# Patient Record
Sex: Male | Born: 1945 | Race: Black or African American | Hispanic: No | Marital: Single | Smoking: Never smoker
Health system: Southern US, Community
[De-identification: ages and names within clinical notes are randomized; demographics above are authoritative.]

## PROBLEM LIST (undated history)

## (undated) DIAGNOSIS — M25552 Pain in left hip: Secondary | ICD-10-CM

## (undated) DIAGNOSIS — C801 Malignant (primary) neoplasm, unspecified: Secondary | ICD-10-CM

## (undated) DIAGNOSIS — R569 Unspecified convulsions: Secondary | ICD-10-CM

## (undated) DIAGNOSIS — F411 Generalized anxiety disorder: Secondary | ICD-10-CM

## (undated) DIAGNOSIS — D62 Acute posthemorrhagic anemia: Secondary | ICD-10-CM

## (undated) DIAGNOSIS — S72142A Displaced intertrochanteric fracture of left femur, initial encounter for closed fracture: Secondary | ICD-10-CM

## (undated) DIAGNOSIS — F419 Anxiety disorder, unspecified: Secondary | ICD-10-CM

## (undated) DIAGNOSIS — E46 Unspecified protein-calorie malnutrition: Secondary | ICD-10-CM

## (undated) DIAGNOSIS — E8809 Other disorders of plasma-protein metabolism, not elsewhere classified: Secondary | ICD-10-CM

## (undated) HISTORY — DX: Other disorders of plasma-protein metabolism, not elsewhere classified: E88.09

## (undated) HISTORY — PX: TONSILECTOMY, ADENOIDECTOMY, BILATERAL MYRINGOTOMY AND TUBES: SHX2538

## (undated) HISTORY — PX: COLON SURGERY: SHX602

## (undated) HISTORY — DX: Acute posthemorrhagic anemia: D62

## (undated) HISTORY — DX: Unspecified protein-calorie malnutrition: E46

## (undated) HISTORY — DX: Generalized anxiety disorder: F41.1

## (undated) HISTORY — PX: HIP SURGERY: SHX245

## (undated) HISTORY — DX: Pain in left hip: M25.552

## (undated) HISTORY — PX: RECTAL SURGERY: SHX760

## (undated) HISTORY — DX: Displaced intertrochanteric fracture of left femur, initial encounter for closed fracture: S72.142A

## (undated) NOTE — *Deleted (*Deleted)
PMR Admission Coordinator Pre-Admission Assessment  Patient: Carlos [name] is an 51 y.o., male MRN: [id number] DOB: Jul 15, 1945 Height: 5\' 7"  (170.2 cm) Weight: 61.7 kg  Insurance Information HMO: ***    PPO: ***     PCP:      IPA:      80/20:      OTHER:  PRIMARY: [employer] NAP      Policy#: [id number]      Subscriber: patient CM Name: ***      Phone#: ***     Fax#: *** Pre-Cert#: ***      Employer: *** Benefits:  Phone #: ***     Name: *** Dolores Hoose. Date: ***     Deduct: ***      Out of Pocket Max: ***      Life Max: *** CIR: ***      SNF: *** Outpatient: ***     Co-Pay: *** Home Health: ***      Co-Pay: *** DME: ***     Co-Pay: *** Providers: in-network SECONDARY:       Policy#:      Phone#:   Financial Counselor:       Phone#:   The Data processing manager" for patients in Inpatient Rehabilitation Facilities with attached "Privacy Act Statement-Health Care Records" was provided and verbally reviewed with: {CHL IP Patient Family WN:[id number]}  Emergency Contact Information Contact Information    Name Relation Home Work Mobile   [place] Sister [phone]  [phone]   Doroteo, Nickolson [phone]  [phone]      Current Medical History  Patient Admitting Diagnosis: closed comminuted intertrochanteric fracture of left femur; s/p IM nailing History of Present Illness: *** Complete NIHSS TOTAL: 2  Patient's medical record from [facility] has been reviewed by the rehabilitation admission coordinator and physician.  Past Medical History  Past Medical History:  Diagnosis Date  . Anxiety   . Benign prostatic hyperplasia 03/30/2013   10/1 IMO update  . Cancer (HCC)   . 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 Seizures (HCC)     Family History   family history includes Brain cancer in his father; Dementia in his mother; Diabetes in his mother; Osteoporosis in his mother.  Prior Rehab/Hospitalizations Has the patient had  prior rehab or hospitalizations prior to admission? Yes  Has the patient had major surgery during 100 days prior to admission? Yes   Current Medications  Current Facility-Administered Medications:  .  Chlorhexidine Gluconate Cloth 2 % PADS 6 each, 6 each, Topical, Daily, Dahal, Binaya, MD .  docusate sodium (COLACE) capsule 100 mg, 100 mg, Oral, BID, Swinteck, Arlys John, MD, 100 mg at 12/12/19 1058 .  enoxaparin (LOVENOX) injection 40 mg, 40 mg, Subcutaneous, Q24H, Swinteck, Arlys John, MD, 40 mg at 12/12/19 1057 .  HYDROcodone-acetaminophen (NORCO/VICODIN) 5-325 MG per tablet 1 tablet, 1 tablet, Oral, Q4H PRN, Samson Frederic, MD, 1 tablet at 12/11/19 2109 .  HYDROmorphone (DILAUDID) injection 0.5 mg, 0.5 mg, Intravenous, Q4H PRN, Swinteck, Arlys John, MD .  levETIRAcetam (KEPPRA) tablet 1,250 mg, 1,250 mg, Oral, BID, Swinteck, Arlys John, MD, 1,250 mg at 12/12/19 1058 .  Lidocaine (Anorectal) SUPP 50 mg, 50 mg, Rectal, Q8H PRN, Swinteck, Arlys John, MD .  menthol-cetylpyridinium (CEPACOL) lozenge 3 mg, 1 lozenge, Oral, PRN **OR** phenol (CHLORASEPTIC) mouth spray 1 spray, 1 spray, Mouth/Throat, PRN, Swinteck, Arlys John, MD .  methocarbamol (ROBAXIN) tablet 500 mg, 500 mg, Oral, Q6H PRN **OR** methocarbamol (ROBAXIN) 500 mg in dextrose 5 % 50 mL IVPB,  500 mg, Intravenous, Q6H PRN, Swinteck, Arlys John, MD .  metoCLOPramide (REGLAN) tablet 5-10 mg, 5-10 mg, Oral, Q8H PRN **OR** metoCLOPramide (REGLAN) injection 5-10 mg, 5-10 mg, Intravenous, Q8H PRN, Swinteck, Arlys John, MD .  ondansetron (ZOFRAN) tablet 4 mg, 4 mg, Oral, Q6H PRN **OR** ondansetron (ZOFRAN) injection 4 mg, 4 mg, Intravenous, Q6H PRN, Swinteck, Arlys John, MD .  zolpidem (AMBIEN) tablet 5 mg, 5 mg, Oral, QHS PRN, Samson Frederic, MD, 5 mg at 12/10/19 2054  Patients Current Diet:  Diet Order            Diet regular Room service appropriate? Yes; Fluid consistency: Thin  Diet effective now                 Precautions / Restrictions Precautions Precautions: Fall  Restrictions Weight Bearing Restrictions: Yes LUE Weight Bearing: Weight bearing as tolerated   Has the patient had 2 or more falls or a fall with injury in the past year? pt fell ~2 times resulting in injuries but daughter not sure if falls occured in 2019 or 2020  Prior Activity Level Community (5-7x/wk): 5x/week for medical tx  Prior Functional Level Self Care: Did the patient need help bathing, dressing, using the toilet or eating? Independent  Indoor Mobility: Did the patient need assistance with walking from room to room (with or without device)? Independent  Stairs: Did the patient need assistance with internal or external stairs (with or without device)? Independent  Functional Cognition: Did the patient need help planning regular tasks such as shopping or remembering to take medications? Needed some help  Home Assistive Devices / Equipment Home Assistive Devices/Equipment: Eyeglasses Home Equipment: Gilmer Mor - single point (pt's mother utilizes all other DME owned)  Prior Device Use: Indicate devices/aids used by the patient prior to current illness, exacerbation or injury? None of the above  Current Functional Level Cognition  Overall Cognitive Status: Impaired/Different from baseline Current Attention Level: Sustained Orientation Level: Oriented to person, Disoriented to place, Disoriented to time, Disoriented to situation Following Commands: Follows one step commands consistently Safety/Judgement: Decreased awareness of safety, Decreased awareness of deficits    Extremity Assessment (includes Sensation/Coordination)  Upper Extremity Assessment: Overall WFL for tasks assessed  Lower Extremity Assessment: LLE deficits/detail LLE Deficits / Details: L hip flexion 3-/5, knee flexion/extension 3-/5, ankle PF/DF 4/5, pain limiting    ADLs       Mobility  Overal bed mobility: Needs Assistance Bed Mobility: Supine to Sit Supine to sit: Mod assist, HOB elevated General  bed mobility comments: pt requires assistance to mobilize LLE and to pull trunk into sitting    Transfers  Overall transfer level: Needs assistance Equipment used: Rolling walker (2 wheeled) Transfers: Sit to/from Stand Sit to Stand: Mod assist General transfer comment: increased time, PT cueing for hand placement and technique. Pt requires PT physical assistance to power up into standing    Ambulation / Gait / Stairs / Wheelchair Mobility  Ambulation/Gait Ambulation/Gait assistance: Mod assist Gait Distance (Feet): 2 Feet Assistive device: Rolling walker (2 wheeled) Gait Pattern/deviations: Step-to pattern General Gait Details: short step to gait with reduced LLE foot clearance, increased trunk flexion, 2 losses of balance when stepping backward to turn to chair Gait velocity: reduced Gait velocity interpretation: <1.31 ft/sec, indicative of household ambulator    Posture / Balance Dynamic Sitting Balance Sitting balance - Comments: reliant on UE support of bed Balance Overall balance assessment: Needs assistance Sitting-balance support: Single extremity supported, Bilateral upper extremity supported, Feet supported Sitting balance-Leahy Scale:  Poor Sitting balance - Comments: reliant on UE support of bed Standing balance support: Bilateral upper extremity supported Standing balance-Leahy Scale: Poor Standing balance comment: min-modA with BUE support of RW    Special needs/care consideration Skin Avulsion: buttocks; Blister: buttocks; Pressure injury: R buttocks stage 2; Surgical incision: Left leg and Designated visitor Abrar Bilton, sister, Kristine Garbe   Previous Home Environment (from acute therapy documentation) Living Arrangements: Spouse/significant other  Lives With: Spouse (caregivers/family present for 24 hour care) Available Help at Discharge: Family, Personal care attendant, Available 24 hours/day Type of Home: House Home Layout: Two level Alternate Level  Stairs-Rails: Right Alternate Level Stairs-Number of Steps: 13-16 Home Access: Stairs to enter, Ramped entrance Entrance Stairs-Rails: None Entrance Stairs-Number of Steps: 2-3 Bathroom Shower/Tub: Engineer, manufacturing systems: Standard Bathroom Accessibility: Yes How Accessible: Accessible via walker Home Care Services: Other (Comment) (caregivers make sure he gets food and medicines.)  Discharge Living Setting Plans for Discharge Living Setting: Patient's home Type of Home at Discharge: House Discharge Home Layout: Two level Alternate Level Stairs-Rails: Right Alternate Level Stairs-Number of Steps: 13-16 Discharge Home Access: Stairs to enter, Ramped entrance Entrance Stairs-Rails: None Entrance Stairs-Number of Steps: 2-3 Discharge Bathroom Shower/Tub: Tub/shower unit Discharge Bathroom Toilet: Standard Discharge Bathroom Accessibility: Yes How Accessible: Accessible via walker Does the patient have any problems obtaining your medications?: No  Social/Family/Support Systems Patient Roles: Spouse Anticipated Caregiver: Sherryl Barters, daughter; Flora Lipps, daughter Anticipated Caregiver's Contact Information: Windell Moulding: [phone] Cloud: [phone] Caregiver Availability: Other (Comment) (caregivers 4 days/week; family on weekends) Discharge Plan Discussed with Primary Caregiver: Yes Is Caregiver In Agreement with Plan?: Yes Does Caregiver/Family have Issues with Lodging/Transportation while Pt is in Rehab?: No  Goals Pt/Family Agrees to Admission and willing to participate: Yes Program Orientation Provided & Reviewed with Pt/Caregiver Including Roles  & Responsibilities: Yes  Decrease burden of Care through IP rehab admission: NA  Possible need for SNF placement upon discharge: NA  Patient Condition: {PATIENT'S CONDITION:22832}  Preadmission Screen Completed By:  Domingo Pulse, 12/12/2019 2:52 PM  ______________________________________________________________________   Discussed status with Dr. Marland Kitchen on *** at *** and received approval for admission today.  Admission Coordinator:  Domingo Pulse, CCC-SLP, time ***Dorna Bloom ***   Assessment/Plan: Diagnosis: 1. Does the need for close, 24 hr/day Medical supervision in concert with the patient's rehab needs make it unreasonable for this patient to be served in a less intensive setting? {yes_no_potentially:3041433} 2. Co-Morbidities requiring supervision/potential complications: *** 3. Due to {due GN:5621308}, does the patient require 24 hr/day rehab nursing? {yes_no_potentially:3041433} 4. Does the patient require coordinated care of a physician, rehab nurse, PT, OT, and SLP to address physical and functional deficits in the context of the above medical diagnosis(es)? {yes_no_potentially:3041433} Addressing deficits in the following areas: {deficits:3041436} 5. Can the patient actively participate in an intensive therapy program of at least 3 hrs of therapy 5 days a week? {yes_no_potentially:3041433} 6. The potential for patient to make measurable gains while on inpatient rehab is {potential:3041437} 7. Anticipated functional outcomes upon discharge from inpatient rehab: {functional outcomes:304600100} PT, {functional outcomes:304600100} OT, {functional outcomes:304600100} SLP 8. Estimated rehab length of stay to reach the above functional goals is: *** 9. Anticipated discharge destination: {anticipated dc setting:21604} 10. Overall Rehab/Functional Prognosis: {potential:3041437}   MD Signature: ***

## (undated) NOTE — *Deleted (*Deleted)
Physical Medicine and Rehabilitation Admission H&P    Chief Complaint  Patient presents with  . Seizures  : HPI: Carlos [name] is a 55 year old right-handed male with history of epilepsy maintained on Keppra, rectal adenocarcinoma maintained on Xeloda receiving radiation therapy followed by oncology services Dr.Feng as well as radiation oncology Dr. Mitzi Hansen, BPH, anxiety as well as history of mild cognitive disorder. Per chart review patient lives with his 56 year old mother. Independent prior to admission working at the [employer]. His mother has 24-hour personal care attendants. Patient reportedly has good support of local family.  Presented 12/08/2019 to [facility] hospital with reported breakthrough seizure after radiation treatment.  He was treated with Ativan IM and loaded with Keppra.  Cranial CT scan negative.  Admission chemistries potassium 3.0 glucose 136, hemoglobin 12.2 WBC 3.7, ammonia level 182 question false positive and repeated 24, lactic acid 1.9.  EEG with no seizure.  While patient was at [facility] long ED he did sustain another seizure and fell out of the bed without loss of consciousness landing on his left hip.  X-rays and imaging revealed left intertrochanteric femur fracture.  Patient underwent intramedullary fixation 12/11/2019 per Dr. Linna Caprice.  Weightbearing as tolerated.  Maintained on aspirin 325 mg daily for DVT prophylaxis.  Acute blood loss anemia 8.4 and monitored.  Therapy evaluations completed and patient was admitted for a comprehensive rehab program.  Review of Systems  Constitutional: Negative for chills and fever.  HENT: Negative for hearing loss.   Eyes: Negative for blurred vision and double vision.  Respiratory: Negative for cough and shortness of breath.   Cardiovascular: Negative for chest pain and palpitations.  Gastrointestinal: Positive for constipation. Negative for heartburn, nausea and vomiting.  Genitourinary: Negative for dysuria, flank  pain and hematuria.  Musculoskeletal: Positive for joint pain and myalgias.  Skin: Negative for rash.  Neurological: Positive for seizures.  Psychiatric/Behavioral:       Anxiety  All other systems reviewed and are negative.  Past Medical History:  Diagnosis Date  . Anxiety   . Benign prostatic hyperplasia 03/30/2013   10/1 IMO update  . Cancer (HCC)   . 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 Seizures (HCC)    Past Surgical History:  Procedure Laterality Date  . INTRAMEDULLARY (IM) NAIL INTERTROCHANTERIC Left 12/11/2019   Procedure: INTRAMEDULLARY (IM) NAIL INTERTROCHANTRIC;  Surgeon: Samson Frederic, MD;  Location: [facility] OR;  Service: Orthopedics;  Laterality: Left;  . PORTACATH PLACEMENT N/A 07/01/2019   Procedure: PORT ULTRASOUND GUIDED PLACEMENT;  Surgeon: Ovidio Kin, MD;  Location: [facility] ORS;  Service: General;  Laterality: N/A;  . PROSTATE SURGERY  2004  . TONSILECTOMY, ADENOIDECTOMY, BILATERAL MYRINGOTOMY AND TUBES     Family History  Problem Relation Age of Onset  . Osteoporosis Mother   . Dementia Mother   . Diabetes Mother   . Brain cancer Father    Social History:  reports that he has never smoked. He has never used smokeless tobacco. He reports that he does not drink alcohol and does not use drugs. Allergies: No Known Allergies Medications Prior to Admission  Medication Sig Dispense Refill  . bismuth subsalicylate (PEPTO BISMOL) 262 MG/15ML suspension Take 30 mLs by mouth every 6 (six) hours as needed for indigestion.    . capecitabine (XELODA) 500 MG tablet Take 3 tablets (1,500 mg total) by mouth 2 (two) times daily after a meal. Take on days of radiation, Monday through Friday (Patient taking differently: Take 500 mg by  mouth See admin instructions. Takes 3 tablets in the morning and 3 tablets at night .Take on days of radiation, Monday through Friday. Takes after the radiation.) 90 tablet 0  . HYDROcodone-Acetaminophen 5-300 MG TABS Take 1  tablet by mouth every 8 (eight) hours as needed (for severe pain as needed). 20 tablet 0  . levETIRAcetam (KEPPRA) 1000 MG tablet Take 1 tablet (1,000 mg total) by mouth 2 (two) times daily. 60 tablet 11  . lidocaine-prilocaine (EMLA) cream Apply 1 application topically daily as needed (pain).     . Multiple Vitamin (MULTIVITAMIN WITH MINERALS) TABS tablet Take 1 tablet by mouth daily.    Marland Kitchen OVER THE COUNTER MEDICATION Take 1 tablet by mouth daily. Mind work -  Supplement for brain    . traMADol (ULTRAM) 50 MG tablet Take 1 tablet (50 mg total) by mouth every 6 (six) hours as needed for moderate pain. 30 tablet 0  . Lidocaine, Anorectal, 50 MG SUPP Place 50 mg rectally every 8 (eight) hours as needed. (Patient not taking: Reported on 12/08/2019) 14 suppository 2  . potassium chloride SA (KLOR-CON) 20 MEQ tablet Take 1 tablet (20 mEq total) by mouth daily. (Patient not taking: Reported on 12/08/2019) 14 tablet 0    Drug Regimen Review Drug regimen was reviewed and remains appropriate with no significant issues identified  Home: Home Living Family/patient expects to be discharged to:: Private residence Living Arrangements: Parent (mother) Available Help at Discharge: Personal care attendant (near 24/7 assistance from staff per pt and sister) Type of Home: House Home Access: Stairs to enter, Ramped entrance Secretary/administrator of Steps: 3 Entrance Stairs-Rails: Right Home Layout: Two level Alternate Level Stairs-Number of Steps: 1 flight Alternate Level Stairs-Rails: Right, Left Bathroom Shower/Tub: Engineer, manufacturing systems: Standard Bathroom Accessibility: Yes Home Equipment: Cane - single point (pt's mother utilizes all other DME owned)  Lives With: Spouse (caregivers/family present for 24 hour care)   Functional History: Prior Function Level of Independence: Independent 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[employer]  Functional Status:  Mobility: Bed Mobility Overal bed mobility:  Needs Assistance Bed Mobility: Supine to Sit, Sit to Supine Supine to sit: HOB elevated, Max assist Sit to supine: Mod assist General bed mobility comments: Pt received sitting EOB. Transfers Overall transfer level: Needs assistance Equipment used: Rolling walker (2 wheeled) Transfer via Lift Equipment: Stedy Transfers: Sit to/from Stand, [employer] Transfers Sit to Stand: Mod assist Stand pivot transfers: Mod assist, +2 safety/equipment General transfer comment: cues for hand placement, assist to power up and stabilize balance Ambulation/Gait Ambulation/Gait assistance: Mod assist, +2 safety/equipment Gait Distance (Feet): 15 Feet Assistive device: Rolling walker (2 wheeled) Gait Pattern/deviations: Step-to pattern, Decreased weight shift to left, Decreased stance time - left General Gait Details: cues for hand placement and sequencing, assist to maintain balance and manage RW Gait velocity: decreased Gait velocity interpretation: <1.31 ft/sec, indicative of household ambulator    ADL: ADL Overall ADL's : Needs assistance/impaired Eating/Feeding: Set up, Sitting Grooming: Min guard, Sitting Upper Body Bathing: Minimal assistance, Sitting Lower Body Bathing: Maximal assistance, Sit to/from stand, Sitting/lateral leans Upper Body Dressing : Minimal assistance, Sitting Lower Body Dressing: Maximal assistance, Sit to/from stand Toileting- Clothing Manipulation and Hygiene: Total assistance, Sitting/lateral lean, Sit to/from stand Functional mobility during ADLs: Maximal assistance (sit<>stand at [facility])  Cognition: Cognition Overall Cognitive Status: Impaired/Different from baseline Orientation Level: Oriented to person, Oriented to place Cognition Arousal/Alertness: Awake/alert Behavior During Therapy: [facility] for tasks assessed/performed Overall Cognitive Status: Impaired/Different from baseline Area  of Impairment: Attention, Memory, Following commands, Safety/judgement,  Awareness, Problem solving Orientation Level: Disoriented to, Time (pt is able to report date but poor memory of when sx happene) Current Attention Level: Sustained Memory: Decreased recall of precautions, Decreased short-term memory Following Commands: Follows one step commands inconsistently, Follows one step commands with increased time Safety/Judgement: Decreased awareness of safety, Decreased awareness of deficits Awareness: Emergent Problem Solving: Slow processing, Difficulty sequencing, Requires verbal cues, Requires tactile cues General Comments: Perseverating on abdominal pain and need to have a BM. Nursing assisted pt to/from [facility] just prior to session and PT assisted to/from [facility] during session. Unsuccessful both attempts.  Physical Exam: Blood pressure 108/62, pulse 73, temperature 98.5 F (36.9 C), temperature source Oral, resp. rate 18, height 5\' 7"  (1.702 m), weight 60.3 kg, SpO2 100 %. Physical Exam Neurological:     Comments: Patient is alert in no acute distress. Makes eye contact with examiner and follows commands. He is able to provide his name age and date of birth. Fair medical historian.     Results for orders placed or performed during the hospital encounter of 12/08/19 (from the past 48 hour(s))  CBC     Status: Abnormal   Collection Time: 12/14/19  1:49 AM  Result Value Ref Range   WBC 6.3 4.0 - 10.5 K/uL   RBC 2.87 (L) 4.22 - 5.81 MIL/uL   Hemoglobin 8.9 (L) 13.0 - 17.0 g/dL   HCT 16.1 (L) 39 - 52 %   MCV 92.7 80.0 - 100.0 fL   MCH 31.0 26.0 - 34.0 pg   MCHC 33.5 30.0 - 36.0 g/dL   RDW 09.6 (H) 04.5 - 40.9 %   Platelets 161 150 - 400 K/uL   nRBC 0.0 0.0 - 0.2 %    Comment: Performed at [facility], [address], [place], [place] [address]  Basic metabolic panel     Status: Abnormal   Collection Time: 12/15/19  3:36 AM  Result Value Ref Range   Sodium 139 135 - 145 mmol/L   Potassium 3.2 (L) 3.5 - 5.1 mmol/L   Chloride 105 98 - 111 mmol/L    CO2 25 22 - 32 mmol/L   Glucose, Bld 104 (H) 70 - 99 mg/dL    Comment: Glucose reference range applies only to samples taken after fasting for at least 8 hours.   BUN 9 8 - 23 mg/dL   Creatinine, Ser 4.78 (L) 0.61 - 1.24 mg/dL   Calcium 8.2 (L) 8.9 - 10.3 mg/dL   GFR, Estimated >29 >56 mL/min    Comment: (NOTE) Calculated using the CKD-EPI Creatinine Equation (2021)    Anion gap 9 5 - 15    Comment: Performed at [facility], [address], [place], [place] [address]  CBC with Differential/Platelet     Status: Abnormal   Collection Time: 12/15/19  3:36 AM  Result Value Ref Range   WBC 4.7 4.0 - 10.5 K/uL   RBC 2.68 (L) 4.22 - 5.81 MIL/uL   Hemoglobin 8.4 (L) 13.0 - 17.0 g/dL   HCT 65.7 (L) 39 - 52 %   MCV 95.5 80.0 - 100.0 fL   MCH 31.3 26.0 - 34.0 pg   MCHC 32.8 30.0 - 36.0 g/dL   RDW 84.6 (H) 96.2 - 95.2 %   Platelets 153 150 - 400 K/uL   nRBC 0.0 0.0 - 0.2 %   Neutrophils Relative % 73 %   Neutro Abs 3.4 1.7 - 7.7  K/uL   Lymphocytes Relative 8 %   Lymphs Abs 0.4 (L) 0.7 - 4.0 K/uL   Monocytes Relative 14 %   Monocytes Absolute 0.7 0.1 - 1.0 K/uL   Eosinophils Relative 3 %   Eosinophils Absolute 0.1 0.0 - 0.5 K/uL   Basophils Relative 0 %   Basophils Absolute 0.0 0.0 - 0.1 K/uL   Immature Granulocytes 2 %   Abs Immature Granulocytes 0.11 (H) 0.00 - 0.07 K/uL    Comment: Performed at [facility], [address] [place], [place] [address]   No results found.     Medical Problem List and Plan: 1.  Decreased functional mobility secondary to breakthrough seizure complicated by left intertrochanteric femur fracture.  Status post intramedullary fixation 12/11/2019.  Weightbearing as tolerated  -patient may *** shower  -ELOS/Goals: *** 2.  Antithrombotics: -DVT/anticoagulation: SCDs  -antiplatelet therapy: Aspirin 325 mg daily 3. Pain Management: Robaxin and hydrocodone as needed 4. Mood: Melatonin 3 mg nightly as needed  -antipsychotic agents: N/A 5.  Neuropsych: This patient is not capable of making decisions on his own behalf. 6. Skin/Wound Care: Routine skin checks 7. Fluids/Electrolytes/Nutrition: Routine in and outs with follow-up chemistries 8.  Acute blood loss anemia.  Follow-up CBC 9.  Seizure disorder.  Keppra 1250 mg twice daily.  EEG negative 10.  Rectal adenocarcinoma.  Follow-up oncology service with Dr. Parke Poisson as well as radiation oncology Dr. Mitzi Hansen.  Radiation treatments currently on hold after hip fracture and Xeloda has been stopped for now    ***  Charlton Amor, PA-C 12/15/2019

---

## 2002-02-05 HISTORY — PX: PROSTATE SURGERY: SHX751

## 2002-11-18 ENCOUNTER — Emergency Department (HOSPITAL_COMMUNITY): Admission: EM | Admit: 2002-11-18 | Discharge: 2002-11-18 | Payer: Self-pay | Admitting: Emergency Medicine

## 2002-12-06 ENCOUNTER — Emergency Department (HOSPITAL_COMMUNITY): Admission: EM | Admit: 2002-12-06 | Discharge: 2002-12-06 | Payer: Self-pay | Admitting: Emergency Medicine

## 2011-06-02 ENCOUNTER — Encounter (HOSPITAL_COMMUNITY): Payer: Self-pay | Admitting: Physical Medicine and Rehabilitation

## 2011-06-02 ENCOUNTER — Emergency Department (HOSPITAL_COMMUNITY)
Admission: EM | Admit: 2011-06-02 | Discharge: 2011-06-02 | Disposition: A | Discharge status: Home / Self Care | Payer: No Typology Code available for payment source | Attending: Emergency Medicine | Admitting: Emergency Medicine

## 2011-06-02 DIAGNOSIS — S8011XA Contusion of right lower leg, initial encounter: Secondary | ICD-10-CM

## 2011-06-02 DIAGNOSIS — S8010XA Contusion of unspecified lower leg, initial encounter: Secondary | ICD-10-CM | POA: Insufficient documentation

## 2011-06-02 NOTE — Discharge Instructions (Signed)
Contusion A contusion is a deep bruise. Contusions happen when an injury causes bleeding under the skin. Signs of bruising include pain, puffiness (swelling), and discolored skin. The contusion may turn blue, purple, or yellow. HOME CARE   Put ice on the injured area.   Put ice in a plastic bag.   Place a towel between your skin and the bag.   Leave the ice on for 15 to 20 minutes, 3 to 4 times a day.   Only take medicine as told by your doctor.   Rest the injured area.   If possible, raise (elevate) the injured area to lessen puffiness.  GET HELP RIGHT AWAY IF:   You have more bruising or puffiness.   You have pain that is getting worse.   Your puffiness or pain is not helped by medicine.  MAKE SURE YOU:   Understand these instructions.   Will watch your condition.   Will get help right away if you are not doing well or get worse.  Document Released: 07/11/2007 Document Revised: 01/11/2011 Document Reviewed: 11/27/2010 [facility] Patient Information 2012 [place], [place].  Ice, elevation, Tylenol or Motrin for pain
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
## 2013-03-30 DIAGNOSIS — N400 Enlarged prostate without lower urinary tract symptoms: Secondary | ICD-10-CM

## 2013-03-30 HISTORY — DX: Benign prostatic hyperplasia without lower urinary tract symptoms: N40.0

## 2014-02-03 ENCOUNTER — Encounter: Payer: No Typology Code available for payment source | Admitting: Emergency Medicine

## 2014-02-03 NOTE — Progress Notes (Signed)
This encounter was created in error - please disregard.

## 2018-06-04 ENCOUNTER — Ambulatory Visit (INDEPENDENT_AMBULATORY_CARE_PROVIDER_SITE_OTHER): Payer: No Typology Code available for payment source

## 2018-06-04 ENCOUNTER — Other Ambulatory Visit: Payer: Self-pay

## 2018-06-04 ENCOUNTER — Ambulatory Visit (HOSPITAL_COMMUNITY)
Admission: EM | Admit: 2018-06-04 | Discharge: 2018-06-04 | Disposition: A | Discharge status: Home / Self Care | Payer: No Typology Code available for payment source | Attending: Family Medicine | Admitting: Family Medicine

## 2018-06-04 ENCOUNTER — Encounter (HOSPITAL_COMMUNITY): Payer: Self-pay

## 2018-06-04 DIAGNOSIS — S93422A Sprain of deltoid ligament of left ankle, initial encounter: Secondary | ICD-10-CM

## 2018-06-04 DIAGNOSIS — M25572 Pain in left ankle and joints of left foot: Secondary | ICD-10-CM

## 2018-06-04 IMAGING — DX LEFT ANKLE COMPLETE - 3+ VIEW
3 series · 3 of 3 positions shown · non-contrast
Comparison: None

CLINICAL DATA: Ankle pain, swelling, and stiffness for 1 day, works
standing 8-10 hrs a day

EXAM:
LEFT ANKLE COMPLETE - 3+ VIEW

[ankle ap]
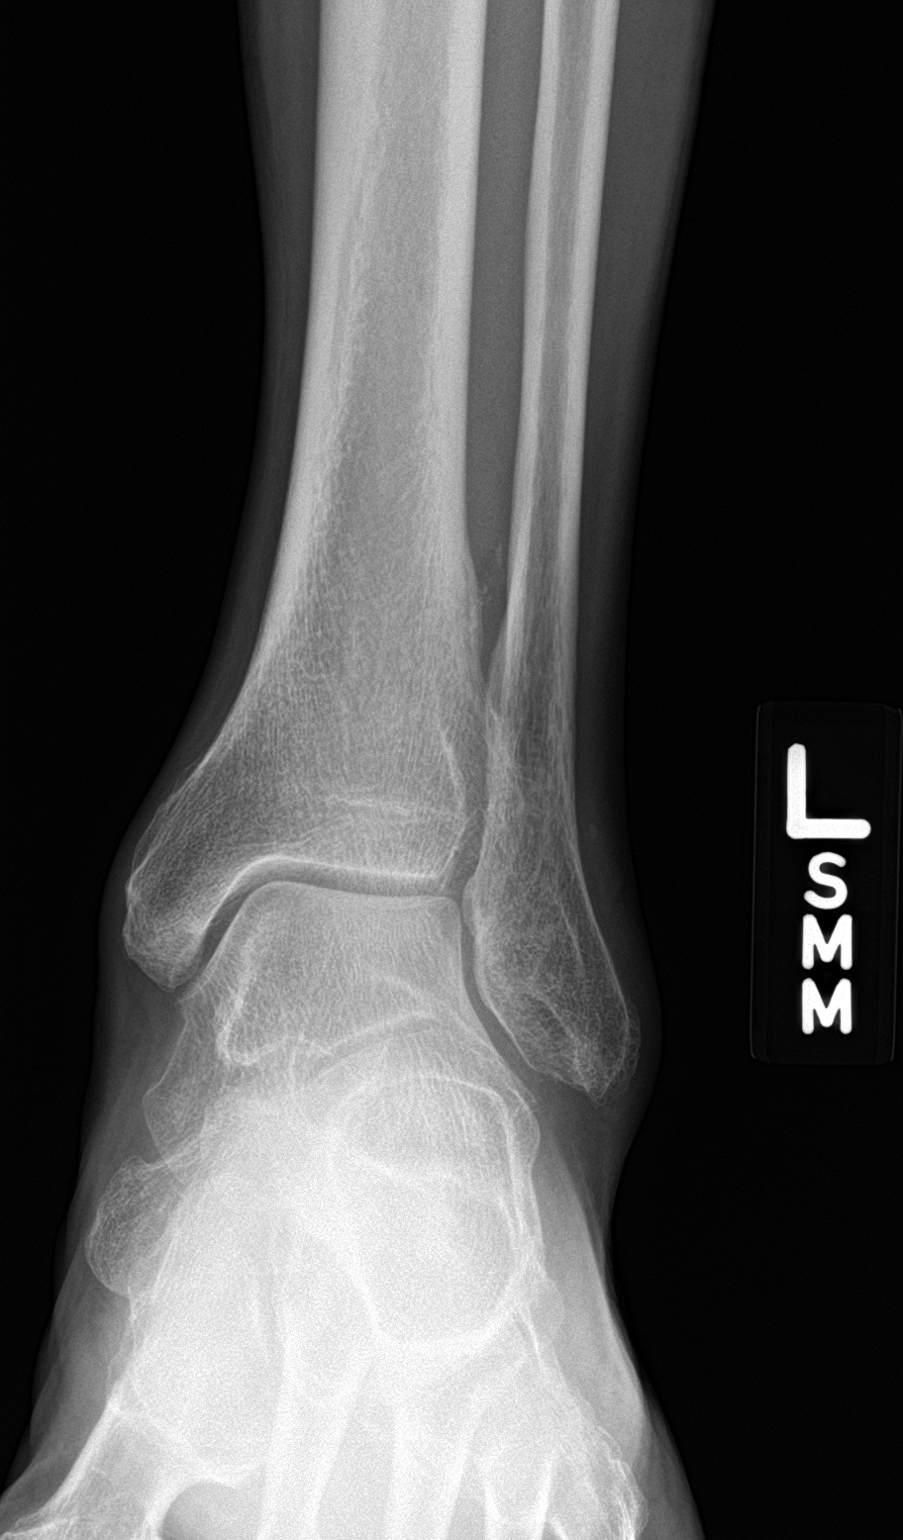

[ankle obl]
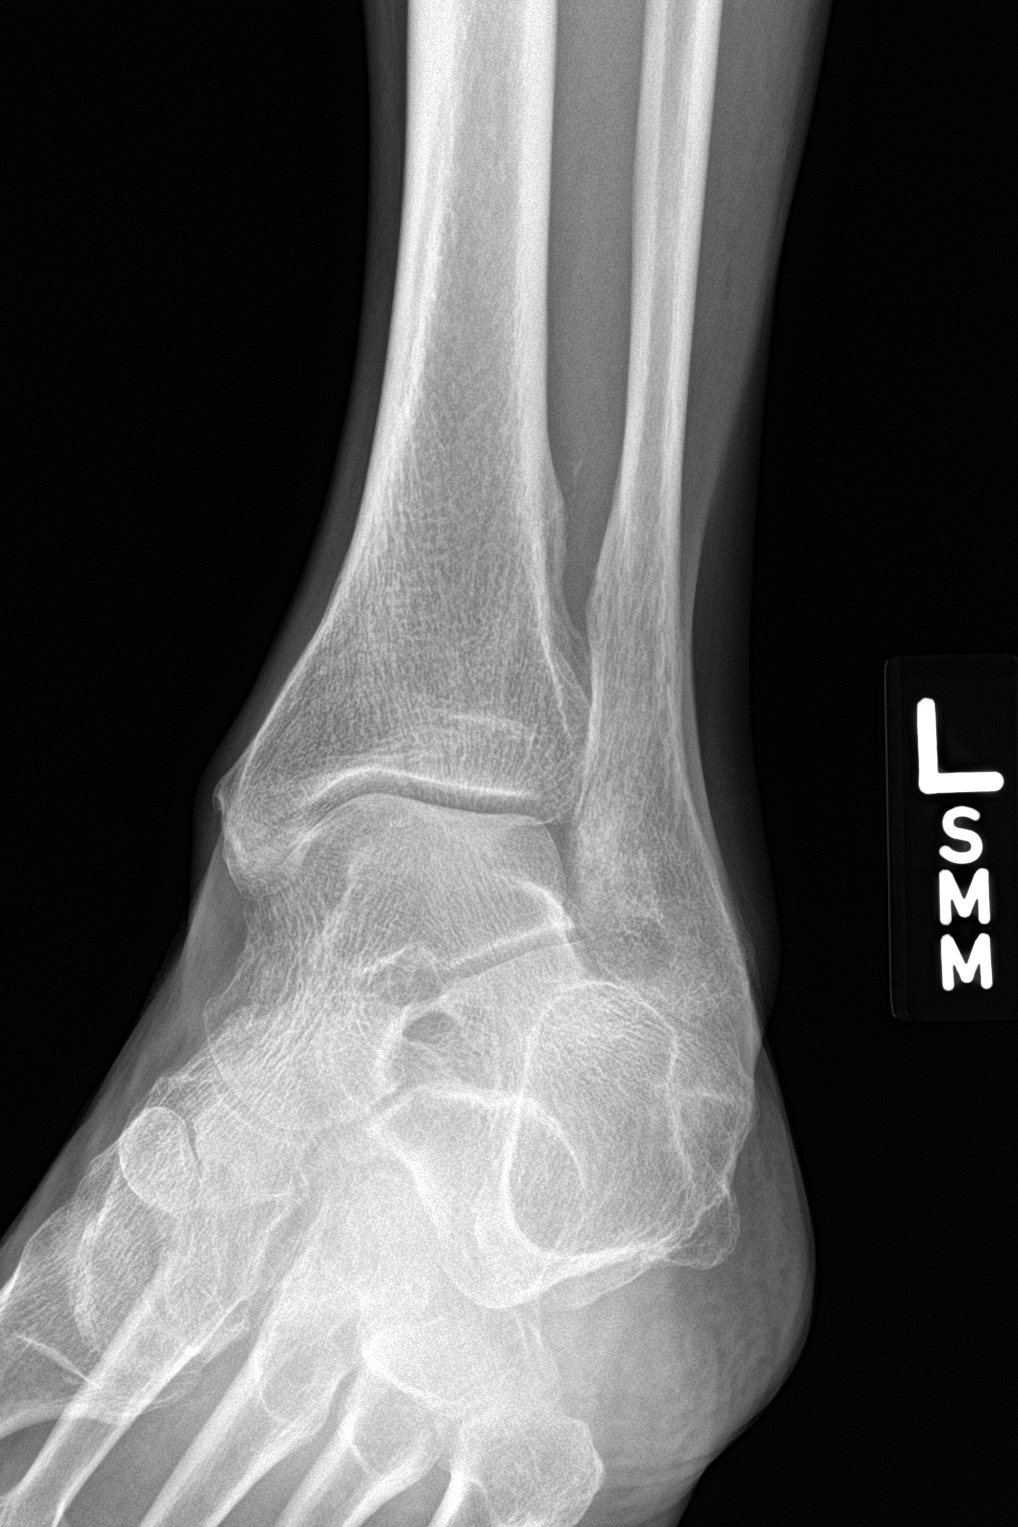

[ankle lat]
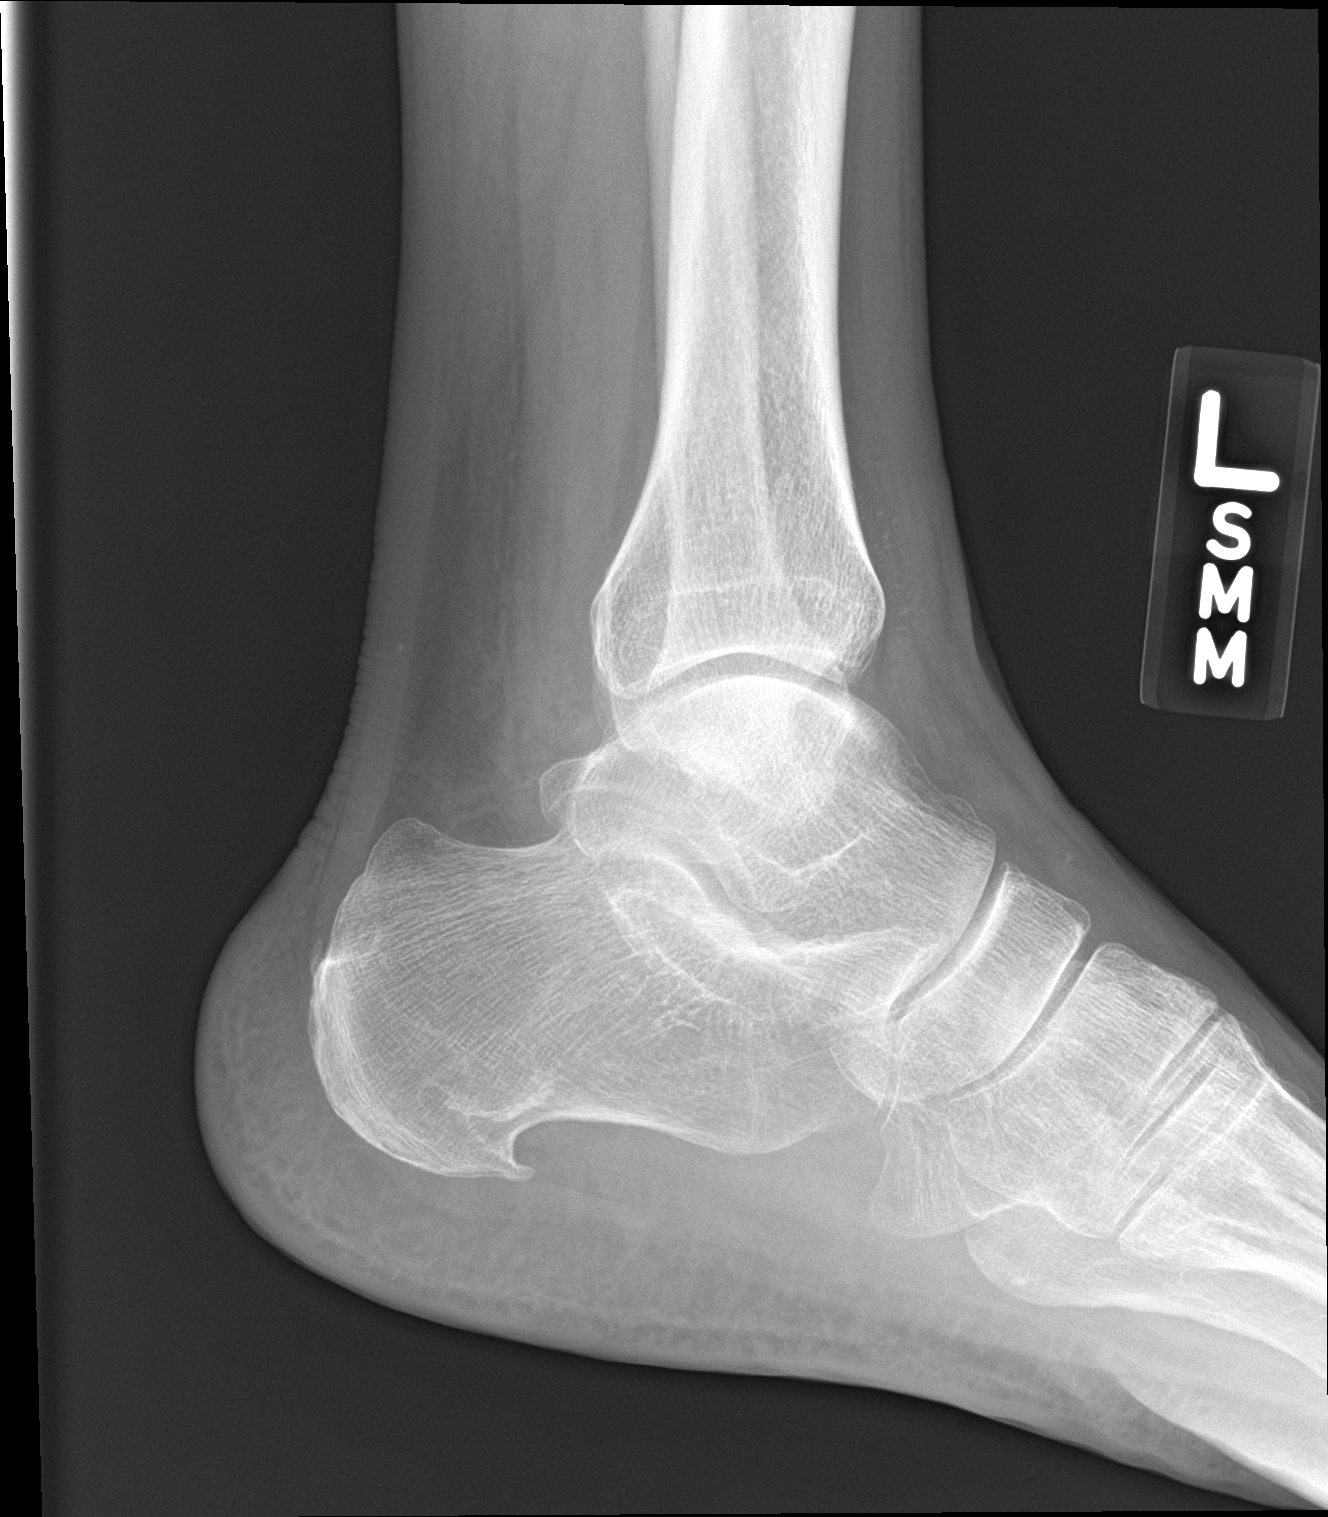

[3 of 3 positions shown; findings below may reference images not displayed]

FINDINGS: Low normal osseous mineralization.

Joint spaces preserved.

Small plantar calcaneal spur.

No acute fracture, dislocation or bone destruction.
IMPRESSION: No acute osseous abnormalities.

Small plantar calcaneal spur.

## 2018-06-04 MED ORDER — DICLOFENAC SODIUM 1 % TD GEL: 4.0000 g | Freq: Four times a day (QID) | TRANSDERMAL | 0 refills | Status: DC

## 2018-06-04 NOTE — ED Provider Notes (Signed)
[place]   [id number] 06/04/18 Arrival Time: 4174  CC: Left ankle PAIN  SUBJECTIVE: History from: patient. Carlos [name] is a 73 y.o. male complains of left ankle pain that began 1 day ago.  Denies a precipitating event or specific injury, but works at the [employer] and reports standing for 8 hours a day.  Localizes the pain to the inside of the left ankle.  Describes the pain as intermittent and dull in character.  Has tried OTC medications without relief.  Symptoms are made worse with walking and weight-bearing.  Reports similar symptoms in the past.  Denies fever, chills, erythema, ecchymosis, effusion, weakness, numbness and tingling.      ROS: As per HPI.  History reviewed. No pertinent past medical history. Past Surgical History:  Procedure Laterality Date  . PROSTATE SURGERY  2004   No Known Allergies No current facility-administered medications on file prior to encounter.    Current Outpatient Medications on File Prior to Encounter  Medication Sig Dispense Refill  . dutasteride (AVODART) 0.5 MG capsule Take 0.5 mg by mouth daily.    Marland Kitchen ibuprofen (ADVIL,MOTRIN) 200 MG tablet Take 200 mg by mouth every 6 (six) hours as needed. For pain     Social History   Socioeconomic History  . Marital status: Single    Spouse name: Not on file  . Number of children: Not on file  . Years of education: Not on file  . Highest education level: Not on file  Occupational History  . Not on file  Social Needs  . Financial resource strain: Not on file  . Food insecurity:    Worry: Not on file    Inability: Not on file  . Transportation needs:    Medical: Not on file    Non-medical: Not on file  Tobacco Use  . Smoking status: Never Smoker  . Smokeless tobacco: Never Used  Substance and Sexual Activity  . Alcohol use: No  . Drug use: No  . Sexual activity: Not on file  Lifestyle  . Physical activity:    Days per week: Not on file    Minutes per session: Not on file   . Stress: Not on file  Relationships  . Social connections:    Talks on phone: Not on file    Gets together: Not on file    Attends religious service: Not on file    Active member of club or organization: Not on file    Attends meetings of clubs or organizations: Not on file    Relationship status: Not on file  . Intimate partner violence:    Fear of current or ex partner: Not on file    Emotionally abused: Not on file    Physically abused: Not on file    Forced sexual activity: Not on file  Other Topics Concern  . Not on file  Social History Narrative  . Not on file   History reviewed. No pertinent family history.  OBJECTIVE:  Vitals:   06/04/18 1230 06/04/18 1231  BP: 130/83   Pulse: 70   Resp: 16   Temp: 97.6 F (36.4 C)   TempSrc: Oral   SpO2: 100%   Weight:  144 lb (65.3 kg)    General appearance: Alert; in no acute distress.  Head: NCAT Lungs: Normal respiratory effort CV: dorsalis pedis and posterior tibialis pulse 2+ and intact Musculoskeletal: Left ankle Inspection: Skin warm, dry, clear and intact without obvious erythema, effusion, or ecchymosis.  Palpation:  Nontender to palpation ROM: FROM active and passive Strength: 5/5 dorsiflexion, 5/5 plantar flexion Skin: warm and dry Neurologic: Ambulates with minimal difficulty; Sensation intact about the lower extremities Psychological: alert and cooperative; normal mood and affect  DIAGNOSTIC STUDIES:  Dg Ankle Complete Left  Result Date: 06/04/2018 CLINICAL DATA:  Ankle pain, swelling, and stiffness for 1 day, works standing 8-10 hrs a day EXAM: LEFT ANKLE COMPLETE - 3+ VIEW COMPARISON:  None FINDINGS: Low normal osseous mineralization. Joint spaces preserved. Small plantar calcaneal spur. No acute fracture, dislocation or bone destruction. IMPRESSION: No acute osseous abnormalities. Small plantar calcaneal spur. Electronically Signed   By: Lavonia Dana M.D.   On: 06/04/2018 13:25     ASSESSMENT & PLAN:   1. Acute left ankle pain   2. Sprain of deltoid ligament of left ankle, initial encounter     Meds ordered this encounter  Medications  . diclofenac sodium (VOLTAREN) 1 % GEL    Sig: Apply 4 g topically 4 (four) times daily. Max amount of 16 g to ankle joint per day    Dispense:  100 g    Refill:  0    Order Specific Question:   Supervising Provider    Answer:   Raylene Everts [[id number]]   X-rays did not show fracture, dislocation, or bone destruction Wear ankle brace as needed for support Symptoms most likely inflammation of the ligaments on the inside of your ankle Continue conservative management of rest, ice, elevation, and gentle stretches Diclofenac gel prescribed.  Use as directed for pain and inflammation Follow up with foot and ankle specialist as needed for further evaluation Return or go to the ER if you have any new or worsening symptoms (fever, chills, chest pain, abdominal pain, changes in bowel or bladder habits, worsening pain, redness, swelling, inability to bear weight, etc...)    Reviewed expectations re: course of current medical issues. Questions answered. Outlined signs and symptoms indicating need for more acute intervention. Patient verbalized understanding. After Visit Summary given.    Lestine Box, PA-C 06/04/18 1356

## 2018-06-04 NOTE — Discharge Instructions (Signed)
X-rays did not show fracture, dislocation, or bone destruction Wear ankle brace as needed for support Symptoms most likely inflammation of the ligaments on the inside of your ankle Continue conservative management of rest, ice, elevation, and gentle stretches Diclofenac gel prescribed.  Use as directed for pain and inflammation Follow up with foot and ankle specialist as needed for further evaluation Return or go to the ER if you have any new or worsening symptoms (fever, chills, chest pain, abdominal pain, changes in bowel or bladder habits, worsening pain, redness, swelling, inability to bear weight, etc...)

## 2018-06-04 NOTE — ED Triage Notes (Signed)
Pt cc left ankle pain . Pt states he works at [employer] and he stands for 8 hours or more.

## 2018-08-24 ENCOUNTER — Other Ambulatory Visit: Payer: Self-pay

## 2018-08-24 ENCOUNTER — Encounter (HOSPITAL_COMMUNITY): Payer: Self-pay | Admitting: Emergency Medicine

## 2018-08-24 ENCOUNTER — Ambulatory Visit (HOSPITAL_COMMUNITY)
Admission: EM | Admit: 2018-08-24 | Discharge: 2018-08-24 | Disposition: A | Discharge status: Home / Self Care | Payer: Self-pay | Attending: Family Medicine | Admitting: Family Medicine

## 2018-08-24 DIAGNOSIS — S20229A Contusion of unspecified back wall of thorax, initial encounter: Secondary | ICD-10-CM

## 2018-08-24 DIAGNOSIS — W06XXXA Fall from bed, initial encounter: Secondary | ICD-10-CM

## 2018-08-24 MED ORDER — KETOROLAC TROMETHAMINE 30 MG/ML IJ SOLN
30.0000 mg | Freq: Once | INTRAMUSCULAR | Status: AC
  Administered 2018-08-24: 30 mg via INTRAMUSCULAR

## 2018-08-24 MED ORDER — METHOCARBAMOL 500 MG PO TABS: 500.0000 mg | ORAL_TABLET | Freq: Two times a day (BID) | ORAL | 0 refills | Status: DC

## 2018-08-24 MED ORDER — KETOROLAC TROMETHAMINE 30 MG/ML IJ SOLN
INTRAMUSCULAR | Status: AC
Start: 2018-08-24 — End: ?
  Filled 2018-08-24: qty 1

## 2018-08-24 NOTE — ED Provider Notes (Signed)
[place]    CSN: [id number] Arrival date & time: 08/24/18  1219     History   Chief Complaint Chief Complaint  Patient presents with  . Fall    HPI Unknown Schleyer is a 73 y.o. male.   Patient presents with back pain after he rolled out of bed 1 week ago and hit his back on a magazine rack.  He denies head injury or loss of consciousness.  He states his back pain is worse with walking; improves with sitting.  He denies weakness, numbness, tingling in his extremities.  He denies headache, dizziness, chest pain, palpitations, shortness of breath.  The history is provided by the patient.    History reviewed. No pertinent past medical history.  There are no active problems to display for this patient.   Past Surgical History:  Procedure Laterality Date  . PROSTATE SURGERY  2004       Home Medications    Prior to Admission medications   Medication Sig Start Date End Date Taking? Authorizing Provider  diclofenac sodium (VOLTAREN) 1 % GEL Apply 4 g topically 4 (four) times daily. Max amount of 16 g to ankle joint per day 06/04/18   W[place]st, [place], PA-C  dutasteride (AVODART) 0.5 MG capsule Take 0.5 mg by mouth daily.    [provider]  ibuprofen (ADVIL,MOTRIN) 200 MG tablet Take 200 mg by mouth every 6 (six) hours as needed. For pain    [provider]  methocarbamol (ROBAXIN) 500 MG tablet Take 1 tablet (500 mg total) by mouth 2 (two) times daily. 08/24/18   Sharion Balloon, NP    Family History History reviewed. No pertinent family history.  Social History Social History   Tobacco Use  . Smoking status: Never Smoker  . Smokeless tobacco: Never Used  Substance Use Topics  . Alcohol use: No  . Drug use: No     Allergies   Patient has no known allergies.   Review of Systems Review of Systems  Constitutional: Negative for chills and fever.  HENT: Negative for ear pain and sore throat.   Eyes: Negative for pain and visual  disturbance.  Respiratory: Negative for cough and shortness of breath.   Cardiovascular: Negative for chest pain and palpitations.  Gastrointestinal: Negative for abdominal pain and vomiting.  Genitourinary: Negative for dysuria and hematuria.  Musculoskeletal: Positive for back pain. Negative for arthralgias, gait problem and neck pain.  Skin: Negative for color change, rash and wound.  Neurological: Negative for dizziness, seizures, syncope, facial asymmetry, speech difficulty, weakness, light-headedness, numbness and headaches.  All other systems reviewed and are negative.    Physical Exam Triage Vital Signs ED Triage Vitals  Enc Vitals Group     BP 08/24/18 1309 (!) 147/90     Pulse Rate 08/24/18 1309 88     Resp 08/24/18 1309 16     Temp 08/24/18 1309 97.9 F (36.6 C)     Temp Source 08/24/18 1309 Oral     SpO2 08/24/18 1309 98 %     Weight --      Height --      Head Circumference --      Peak Flow --      Pain Score 08/24/18 1312 5     Pain Loc --      Pain Edu? --      Excl. in [address]? --    No data found.  Updated Vital Signs BP (!) 147/90 (BP Location: Left  Arm)   Pulse 88   Temp 97.9 F (36.6 C) (Oral)   Resp 16   SpO2 98%   Visual Acuity Right Eye Distance:   Left Eye Distance:   Bilateral Distance:    Right Eye Near:   Left Eye Near:    Bilateral Near:     Physical Exam Vitals signs and nursing note reviewed.  Constitutional:      Appearance: He is well-developed.  HENT:     Head: Normocephalic and atraumatic.     Mouth/Throat:     Mouth: Mucous membranes are moist.  Eyes:     Extraocular Movements: Extraocular movements intact.     Conjunctiva/sclera: Conjunctivae normal.     Pupils: Pupils are equal, round, and reactive to light.  Neck:     Musculoskeletal: Neck supple.  Cardiovascular:     Rate and Rhythm: Normal rate and regular rhythm.  Pulmonary:     Effort: Pulmonary effort is normal. No respiratory distress.     Breath sounds:  Normal breath sounds.  Abdominal:     Palpations: Abdomen is soft.     Tenderness: There is no abdominal tenderness. There is no guarding or rebound.  Musculoskeletal:        General: No swelling, tenderness, deformity or signs of injury.     Comments: Back and spine nontender to palpation.   Skin:    General: Skin is warm and dry.     Findings: No bruising, erythema, lesion or rash.  Neurological:     General: No focal deficit present.     Mental Status: He is alert and oriented to person, place, and time.     Cranial Nerves: No cranial nerve deficit.     Sensory: No sensory deficit.     Motor: No weakness.     Coordination: Coordination normal.     Gait: Gait normal.     Deep Tendon Reflexes: Reflexes normal.      UC Treatments / Results  Labs (all labs ordered are listed, but only abnormal results are displayed) Labs Reviewed - No data to display  EKG   Radiology No results found.  Procedures Procedures (including critical care time)  Medications Ordered in UC Medications  ketorolac (TORADOL) 30 MG/ML injection 30 mg (30 mg Intramuscular Given 08/24/18 1335)  ketorolac (TORADOL) 30 MG/ML injection (has no administration in time range)    Initial Impression / Assessment and Plan / UC Course  I have reviewed the triage vital signs and the nursing notes.  Pertinent labs & imaging results that were available during my care of the patient were reviewed by me and considered in my medical decision making (see chart for details).   Contusion of the back.  Treated today with Toradol injection; prescription given for short course of Robaxin as needed.  Discussed with patient that he should not drive, operate machinery, drink alcohol while taking a muscle relaxer.  Instructed patient to take Tylenol as needed for discomfort.  Discussed that he should go to the emergency department if he develops weakness, numbness, headache, dizziness, chest pain, palpitations, shortness of  breath, other concerning symptoms.     Final Clinical Impressions(s) / UC Diagnoses   Final diagnoses:  Contusion of back wall of thorax, unspecified laterality, initial encounter     Discharge Instructions     You were given a shot for pain today called Toradol.    You can take the muscle relaxer Robaxin as needed for pain.  Do not drive,  operate machinery, drink alcohol while taking this medication as it may make you drowsy.    You can also take Tylenol as needed for discomfort.   To the emergency department if you develop weakness, numbness, headache, dizziness, chest pain, palpitations, shortness of breath, or other concerning symptoms.        ED Prescriptions    Medication Sig Dispense Auth. Provider   methocarbamol (ROBAXIN) 500 MG tablet Take 1 tablet (500 mg total) by mouth 2 (two) times daily. 10 tablet Sharion Balloon, NP     Controlled Substance Prescriptions [place] Controlled Substance Registry consulted? Not Applicable   Sharion Balloon, NP 08/24/18 1358

## 2018-08-24 NOTE — Discharge Instructions (Signed)
You were given a shot for pain today called Toradol.    You can take the muscle relaxer Robaxin as needed for pain.  Do not drive, operate machinery, drink alcohol while taking this medication as it may make you drowsy.    You can also take Tylenol as needed for discomfort.   To the emergency department if you develop weakness, numbness, headache, dizziness, chest pain, palpitations, shortness of breath, or other concerning symptoms.

## 2018-08-24 NOTE — ED Triage Notes (Signed)
Pt here for fall out of bed x 2 last week; pt sts hit his lip x 1 now resolving; pt also sts hit middle back on magazine rack and still having pain

## 2018-10-31 ENCOUNTER — Encounter: Payer: Self-pay | Admitting: Neurology

## 2018-11-07 ENCOUNTER — Encounter

## 2018-11-07 ENCOUNTER — Ambulatory Visit (INDEPENDENT_AMBULATORY_CARE_PROVIDER_SITE_OTHER): Payer: 59 | Admitting: Neurology

## 2018-11-07 ENCOUNTER — Other Ambulatory Visit: Payer: Self-pay

## 2018-11-07 ENCOUNTER — Encounter: Payer: Self-pay | Admitting: Neurology

## 2018-11-07 VITALS — BP 138/77 | HR 76 | Ht 65.0 in | Wt 134.4 lb

## 2018-11-07 DIAGNOSIS — R296 Repeated falls: Secondary | ICD-10-CM

## 2018-11-07 DIAGNOSIS — G3184 Mild cognitive impairment, so stated: Secondary | ICD-10-CM | POA: Diagnosis not present

## 2018-11-07 NOTE — Patient Instructions (Addendum)
1. Schedule MRI brain without contrast. This will be performed at [facility]. They will call you to schedule the appointment. You may call them to schedule if you would like at [phone].  2. Schedule Neurocognitive testing with Dr. Melvyn Novas. Our office will call you to schedule this in a few weeks.  3. Bloodwork from Dr. Orland Mustard, if more is needed, we will let you know  4. Follow-up in 6 months, call for any changes. Please stop by check out and schedule this when you leave today.  FALL PRECAUTIONS: Be cautious when walking. Scan the area for obstacles that may increase the risk of trips and falls. When getting up in the mornings, sit up at the edge of the bed for a few minutes before getting out of bed. Consider elevating the bed at the head end to avoid drop of blood pressure when getting up. Walk always in a well-lit room (use night lights in the walls). Avoid area rugs or power cords from appliances in the middle of the walkways. Use a walker or a cane if necessary and consider physical therapy for balance exercise. Get your eyesight checked regularly.  HOME SAFETY: Consider the safety of the kitchen when operating appliances like stoves, microwave oven, and blender. Consider having supervision and share cooking responsibilities until no longer able to participate in those. Accidents with firearms and other hazards in the house should be identified and addressed as well.  DRIVING: Regarding driving, in patients with progressive memory problems, driving will be impaired. We advise to have someone else do the driving if trouble finding directions or if minor accidents are reported. Independent driving assessment is available to determine safety of driving.  ABILITY TO BE LEFT ALONE: If patient is unable to contact 911 operator, consider using LifeLine, or when the need is there, arrange for someone to stay with patients. Smoking is a fire hazard, consider supervision or cessation. Risk of  wandering should be assessed by caregiver and if detected at any point, supervision and safe proof recommendations should be instituted.  MEDICATION SUPERVISION: Inability to self-administer medication needs to be constantly addressed. Implement a mechanism to ensure safe administration of the medications.  RECOMMENDATIONS FOR ALL PATIENTS WITH MEMORY PROBLEMS: 1. Continue to exercise (Recommend 30 minutes of walking everyday, or 3 hours every week) 2. Increase social interactions - continue going to [place] and enjoy social gatherings with friends and family 3. Eat healthy, avoid fried foods and eat more fruits and vegetables 4. Maintain adequate blood pressure, blood sugar, and blood cholesterol level. Reducing the risk of stroke and cardiovascular disease also helps promoting better memory. 5. Avoid stressful situations. Live a simple life and avoid aggravations. Organize your time and prepare for the next day in anticipation. 6. Sleep well, avoid any interruptions of sleep and avoid any distractions in the bedroom that may interfere with adequate sleep quality 7. Avoid sugar, avoid sweets as there is a strong link between excessive sugar intake, diabetes, and cognitive impairment We discussed the Mediterranean diet, which has been shown to help patients reduce the risk of progressive memory disorders and reduces cardiovascular risk. This includes eating fish, eat fruits and green leafy vegetables, nuts like almonds and hazelnuts, walnuts, and also use olive oil. Avoid fast foods and fried foods as much as possible. Avoid sweets and sugar as sugar use has been linked to worsening of memory function.  There is always a concern of gradual progression of memory problems. If this is the case, then we  may need to adjust level of care according to patient needs. Support, both to the patient and caregiver, should then be put into place.

## 2018-11-07 NOTE — Progress Notes (Signed)
NEUROLOGY CONSULTATION NOTE  Carlos [name] MRN: [id number] DOB: 07-Sep-1945  Referring provider: Dr. [name] Primary care provider: Dr. [name]  Reason for consult:  Memory loss, frequent falls, FH of brain tumor  Dear Dr Orland Mustard:  Thank you for your kind referral of Carlos [name] for consultation of the above symptoms. Although his history is well known to you, please allow me to reiterate it for the purpose of our medical record. The patient was accompanied to the clinic by sister [name] who also provides collateral information. Records and images were personally reviewed where available.   HISTORY OF PRESENT ILLNESS: This is a pleasant 73 year old right-handed man with no significant past medical history presenting for evaluation of memory loss and frequent falls. His sister is present to provide additional information. He thinks his memory is reasonable. He states family has noticed more changes, they ask him things and "I don't give good answers." He endorses short-term memory changes. He lives with his 34 year old mother who has 4 caregivers. 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 He denies getting lost driving. He denies any missed bill payments. He is not on any medications. He denies any difficulties at work, he states it is routine work. His sister started noticing changes a year ago, he would forget conversations. He would forget he had already finished a task, for instance that he had already came from the grocery store. She feels symptoms worsened in the past 5-6 months. Last month, he was visiting another sister and forgot the last portion of the trip, he did not get to his destination and just went home. He has told family he has gotten lost coming home from work, he arrived home an hour later (he has been in the same place for 7 years). His family is also concerned about an increase in frequency of falls. He has had 3 falls in the past 6 months. He reports injuring  his foot and hitting his head with a fall in April, then hurting his back in July. He thinks it is due to clumsiness. No loss of consciousness. They are concerned about the possibility of a brain tumor, their father had a brain tumor. There is a history of memory loss in his mother and younger brother (secondary to stroke). He denies any history of significant head injuries or alcohol use.   He denies any headaches, dizziness, diplopia, dysarthria, dysphagia, neck/back pain, focal numbness/tingling/weakness, bowel/bladder dysfunction, anosmia, or tremors. He felt unwell last night and BP was 151/86, he went home early from work. He states he is on his feet 12 hours a day, and he is now required to work 6 days a week (mandatory due to Covid-19 changes), he was previously working 5 days a week. His sister is concerned he is not eating well, mostly sugars and hotdogs. He is overly tired and sleepy some days. Mood is good, he has "always been hyper, but now it it more" per sister. No hallucinations or paranoia.    PAST MEDICAL HISTORY: History reviewed. No pertinent past medical history.  PAST SURGICAL HISTORY: Past Surgical History:  Procedure Laterality Date   PROSTATE SURGERY  2004   TONSILECTOMY, ADENOIDECTOMY, BILATERAL MYRINGOTOMY AND TUBES      MEDICATIONS: No current outpatient medications on file prior to visit.   No current facility-administered medications on file prior to visit.     ALLERGIES: No Known Allergies  FAMILY HISTORY: Family History  Problem Relation Age of Onset  Osteoporosis Mother    Dementia Mother    Diabetes Mother    Brain cancer Father     SOCIAL HISTORY: Social History   Socioeconomic History   Marital status: Single    Spouse name: Not on file   Number of children: 0   Years of education: Not on file   Highest education level: Not on file  Occupational History   Not on file  Social Needs   Financial resource strain: Not on file     Food insecurity    Worry: Not on file    Inability: Not on file   Transportation needs    Medical: Not on file    Non-medical: Not on file  Tobacco Use   Smoking status: Never Smoker   Smokeless tobacco: Never Used  Substance and Sexual Activity   Alcohol use: No   Drug use: No   Sexual activity: Not on file  Lifestyle   Physical activity    Days per week: Not on file    Minutes per session: Not on file   Stress: Not on file  Relationships   Social connections    Talks on phone: Not on file    Gets together: Not on file    Attends religious service: Not on file    Active member of club or organization: Not on file    Attends meetings of clubs or organizations: Not on file    Relationship status: Not on file   Intimate partner violence    Fear of current or ex partner: Not on file    Emotionally abused: Not on file    Physically abused: Not on file    Forced sexual activity: Not on file  Other Topics Concern   Not on file  Social History Narrative   College    REVIEW OF SYSTEMS: Constitutional: No fevers, chills, or sweats, no generalized fatigue, change in appetite Eyes: No visual changes, double vision, eye pain Ear, nose and throat: No hearing loss, ear pain, nasal congestion, sore throat Cardiovascular: No chest pain, palpitations Respiratory:  No shortness of breath at rest or with exertion, wheezes GastrointestinaI: No nausea, vomiting, diarrhea, abdominal pain, fecal incontinence Genitourinary:  No dysuria, urinary retention or frequency Musculoskeletal:  No neck pain, back pain Integumentary: No rash, pruritus, skin lesions Neurological: as above Psychiatric: No depression, insomnia, anxiety Endocrine: No palpitations, fatigue, diaphoresis, mood swings, change in appetite, change in weight, increased thirst Hematologic/Lymphatic:  No anemia, purpura, petechiae. Allergic/Immunologic: no itchy/runny eyes, nasal congestion, recent allergic  reactions, rashes  PHYSICAL EXAM: Vitals:   11/07/18 0929  BP: 138/77  Pulse: 76  SpO2: 100%   General: No acute distress, in good spirits, verbose Head:  Normocephalic/atraumatic Skin/Extremities: No rash, no edema Neurological Exam: Mental status: alert and oriented to person, place, and time, no dysarthria or aphasia, Fund of knowledge is appropriate.  Recent and remote memory are impaired.  Attention and concentration are normal.    Able to name objects and repeat phrases.  Montreal Cognitive Assessment  11/07/2018  Visuospatial/ Executive (0/5) 3  Naming (0/3) 3  Attention: Read list of digits (0/2) 2  Attention: Read list of letters (0/1) 1  Attention: Serial 7 subtraction starting at 100 (0/3) 3  Language: Repeat phrase (0/2) 2  Language : Fluency (0/1) 0  Abstraction (0/2) 2  Delayed Recall (0/5) 0  Orientation (0/6) 6  Total 22  Adjusted Score (based on education) 22    Cranial nerves: CN I: not  tested CN II: pupils equal, round and reactive to light, visual fields intact CN III, IV, VI:  full range of motion, no nystagmus, no ptosis CN V: facial sensation intact CN VII: upper and lower face symmetric CN VIII: hearing intact to conversation CN IX, X: gag intact, uvula midline CN XI: sternocleidomastoid and trapezius muscles intact CN XII: tongue midline Bulk & Tone: normal, no fasciculations. Motor: 5/5 throughout with no pronator drift. Sensation: intact to light touch, cold, pin, vibration and joint position sense.  No extinction to double simultaneous stimulation.  Romberg test negative Deep Tendon Reflexes: +2 throughout except for absent ankle jerks bilaterally, no ankle clonus Plantar responses: downgoing bilaterally Cerebellar: no incoordination on finger to nose testing Gait: slightly wide-based, no ataxia, mild difficulty with tandem walk Tremor: none  IMPRESSION: This is a pleasant 73 year old right-handed man with no significant history presenting  for evaluation of memory loss. His MOCA score today is 22/30, he denies any difficulties with complex tasks, however family is reporting some driving concerns. We discussed different causes of memory loss, bloodwork from PCP will be requested for review, if not done, TSH and B12 will be ordered. MRI brain without contrast will be scheduled to assess for underlying structural abnormality and assess vascular load. He will be scheduled for Neurocognitive testing to further evaluate memory concerns. Etiology of falls unclear, continue to monitor, consider Balance therapy if falls increase in frequency. We discussed the importance of control of vascular risk factors, physical exercise, and brain stimulation exercises for brain health. Follow-up in 6 months, they know to call for any changes.   Thank you for allowing me to participate in the care of this patient. Please do not hesitate to call for any questions or concerns.   Ellouise Newer, M.D.  CC: Dr. Orland Mustard

## 2018-12-09 ENCOUNTER — Telehealth: Payer: Self-pay | Admitting: Neurology

## 2018-12-09 NOTE — Telephone Encounter (Signed)
Bloodwork from PCP reviewed: 10/28/2018: normal TSH 0.51, B12 [id number]

## 2018-12-19 ENCOUNTER — Other Ambulatory Visit: Payer: Self-pay

## 2018-12-19 ENCOUNTER — Ambulatory Visit
Admission: RE | Admit: 2018-12-19 | Discharge: 2018-12-19 | Disposition: A | Discharge status: Home / Self Care | Payer: 59 | Source: Ambulatory Visit | Attending: Neurology | Admitting: Neurology

## 2018-12-19 DIAGNOSIS — G3184 Mild cognitive impairment, so stated: Secondary | ICD-10-CM

## 2018-12-19 DIAGNOSIS — R296 Repeated falls: Secondary | ICD-10-CM

## 2018-12-19 IMAGING — MR MR HEAD W/O CM
11 series · 48 of 48 positions shown · non-contrast
Comparison: None.

CLINICAL DATA: Mild cognitive impairment.  Frequent falls.

EXAM:
MRI HEAD WITHOUT CONTRAST
TECHNIQUE: Multiplanar, multiecho pulse sequences of the brain and surrounding
structures were obtained without intravenous contrast.

[Series 2: T1 · sagittal · 5.0mm · 0.45mm/px · 2 of 21 slices shown]
[im 1/21]
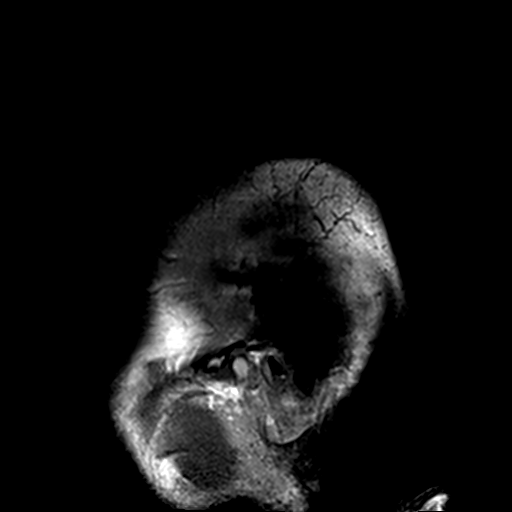
[im 21/21]
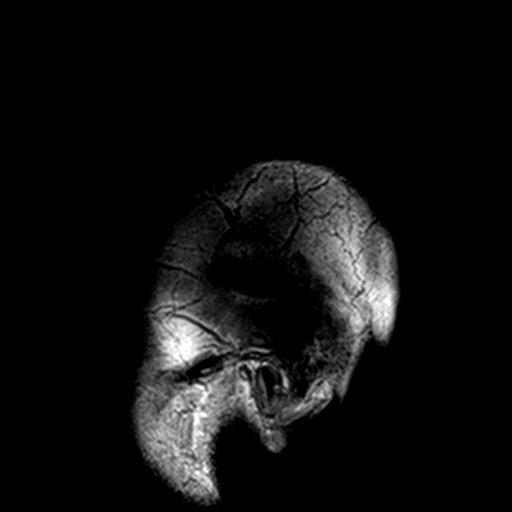

[Series 3: T2 · axial · 5.0mm · 0.51mm/px · z∈[-66,+70]mm · 2 of 21 slices shown (1 of 3)]
[im 1/21]
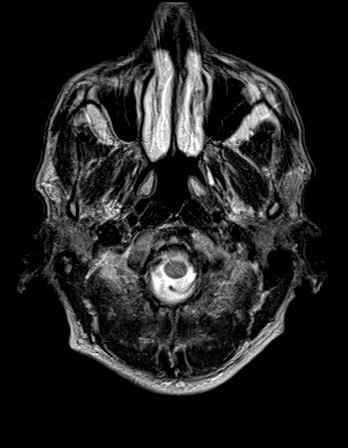
[im 21/21]
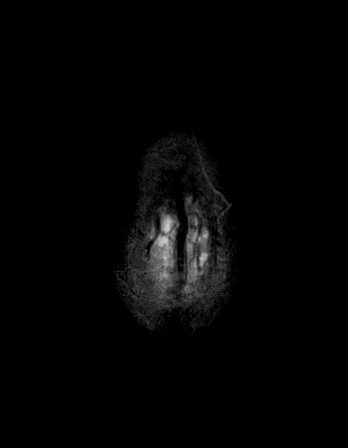

[Series 4: DWI · axial · 3.0mm · 1.80mm/px · z∈[-68,+67]mm · 8 of 92 slices shown (1 of 4)]
[im 1/92]
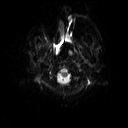
[im 14/92]
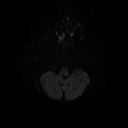
[im 27/92]
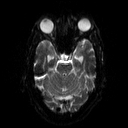
[im 40/92]
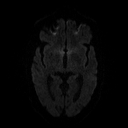
[im 53/92]
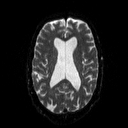
[im 66/92]
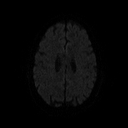
[im 79/92]
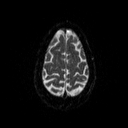
[im 92/92]
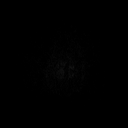

[Series 5: DWI · axial · 3.0mm · 1.80mm/px · z∈[-68,+67]mm · 4 of 44 slices shown (2 of 4)]
[im 1/44]
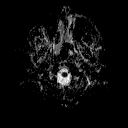
[im 15/44]
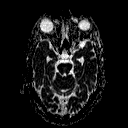
[im 29/44]
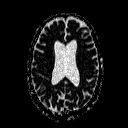
[im 44/44]
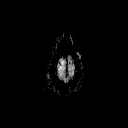

[Series 6: DWI · coronal · 5.0mm · 1.80mm/px · 6 of 67 slices shown (3 of 4)]
[im 1/67]
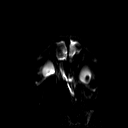
[im 14/67]
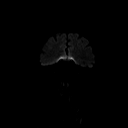
[im 27/67]
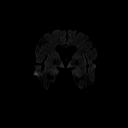
[im 40/67]
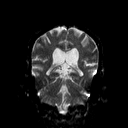
[im 53/67]
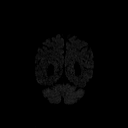
[im 67/67]
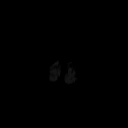

[Series 7: DWI · coronal · 5.0mm · 1.80mm/px · 3 of 34 slices shown (4 of 4)]
[im 1/34]
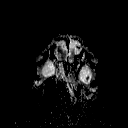
[im 17/34]
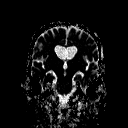
[im 34/34]
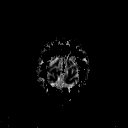

[Series 9: swi_images · axial · 4.0mm · 0.90mm/px · z∈[-70,+70]mm · 3 of 36 slices shown]
[im 1/36]
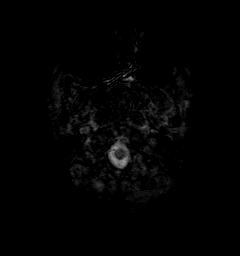
[im 18/36]
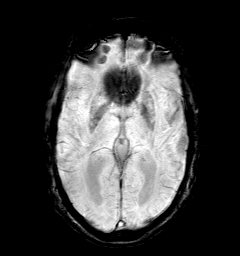
[im 36/36]
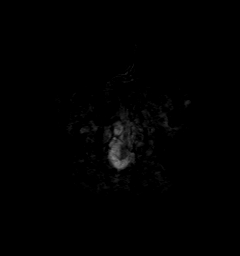

[Series 10: T2 · axial · 5.0mm · 0.72mm/px · z∈[-66,+70]mm · 2 of 22 slices shown (2 of 3)]
[im 1/22]
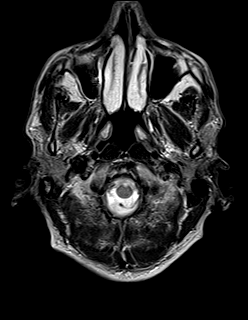
[im 22/22]
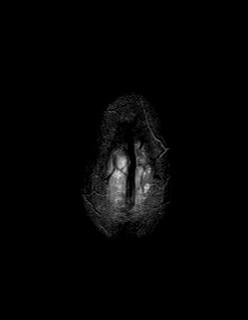

[Series 11: FLAIR · axial · 3.0mm · 0.45mm/px · z∈[-70,+69]mm · 3 of 30 slices shown]
[im 1/30]
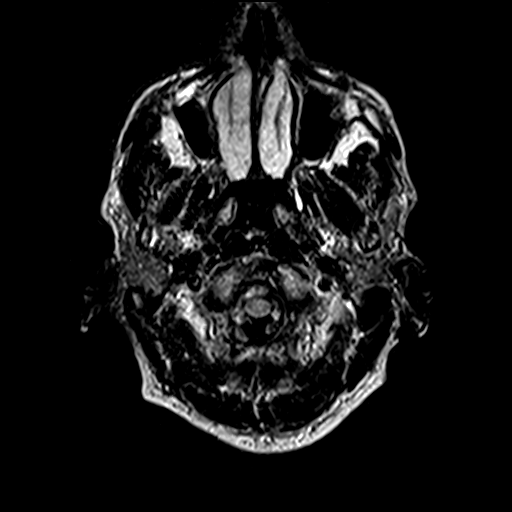
[im 15/30]
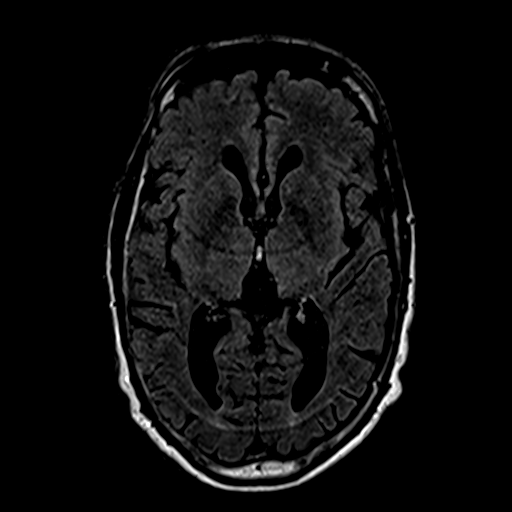
[im 30/30]
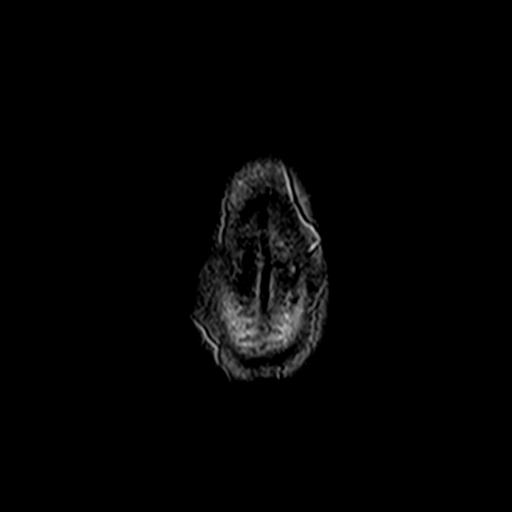

[Series 12: t1_mpr_tra · axial · 1.0mm · 0.71mm/px · z∈[-73,+69]mm · 13 of 144 slices shown]
[im 1/144]
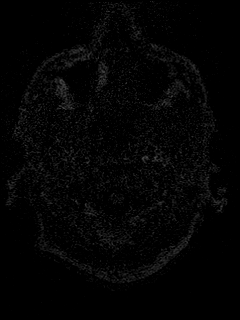
[im 12/144]
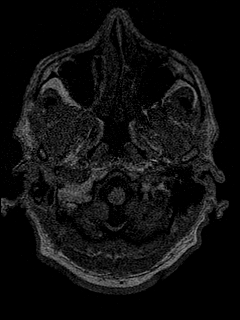
[im 24/144]
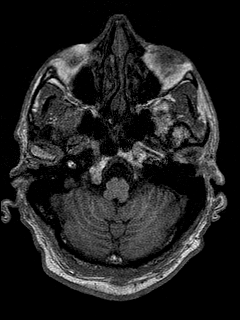
[im 36/144]
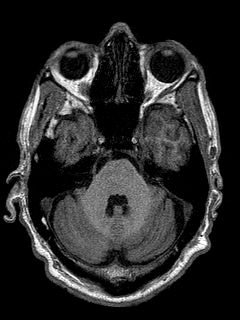
[im 48/144]
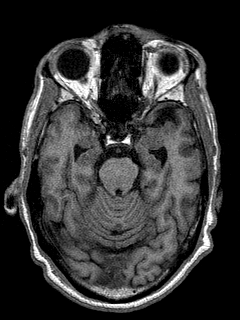
[im 60/144]
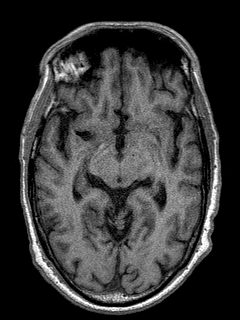
[im 72/144]
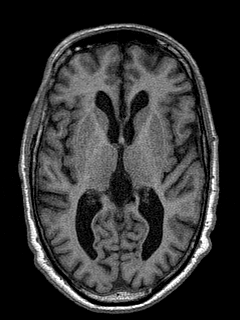
[im 84/144]
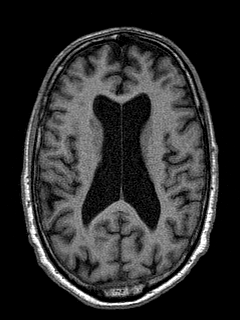
[im 96/144]
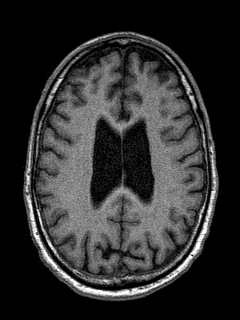
[im 108/144]
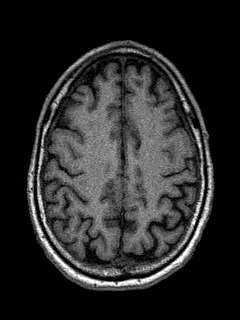
[im 120/144]
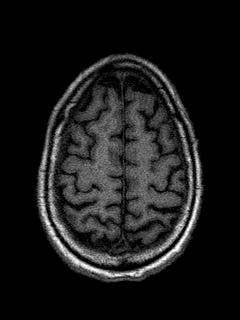
[im 132/144]
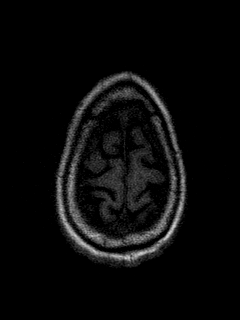
[im 144/144]
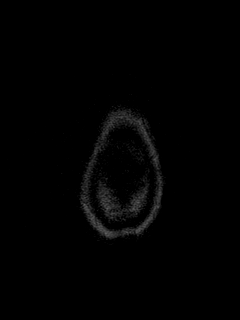

[Series 13: T2 · coronal · 5.0mm · 0.45mm/px · 2 of 26 slices shown (3 of 3)]
[im 1/26]
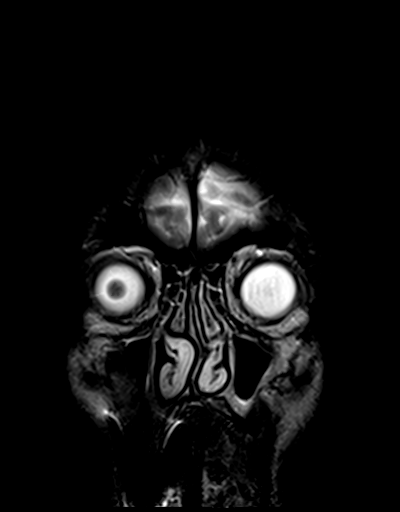
[im 26/26]
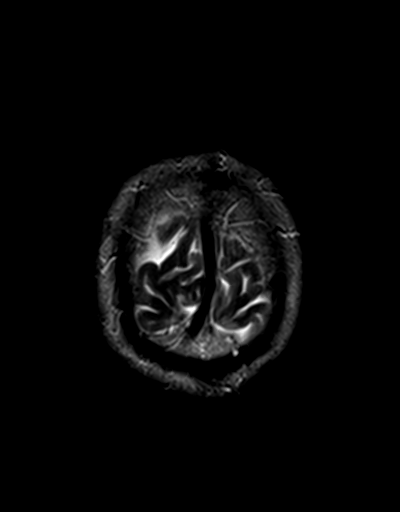

[48 of 48 positions shown; findings below may reference images not displayed]

FINDINGS: Brain: Mild atrophy. Negative for acute infarct. Few small white
matter hyperintensities bilaterally. No cortical infarct. Chronic
microhemorrhage left lateral cerebellum possible cavernoma. No mass
or edema. No midline shift.

Vascular: Normal arterial flow voids

Skull and upper cervical spine: Negative

Sinuses/Orbits: Mild mucosal edema paranasal sinuses. Negative orbit

Other: Image quality degraded by mild motion
IMPRESSION: No acute abnormality.

Mild atrophy and mild chronic microvascular ischemic change in the
white matter. Chronic hemorrhage left lateral cerebellum possible
cavernoma.

## 2019-01-05 ENCOUNTER — Telehealth: Payer: Self-pay | Admitting: Neurology

## 2019-01-05 NOTE — Telephone Encounter (Signed)
Attempted to reach Carlos [name] unable to leave message. Left message at home for patient to call the office regarding MRI results and recommendations.

## 2019-01-05 NOTE — Telephone Encounter (Signed)
Pls let her know the MRI brain looked fine, no evidence of tumor, stroke, or bleed. It showed age-related changes. Proceed with memory testing as scheduled in Dec, thanks

## 2019-01-05 NOTE — Telephone Encounter (Signed)
Patient's sister called requesting recent MRI results, okay to share PHI per DPR on file.

## 2019-01-09 ENCOUNTER — Telehealth: Payer: Self-pay | Admitting: Neurology

## 2019-01-09 NOTE — Telephone Encounter (Signed)
[name] called for the patient regarding MRI results. She is his POA. Please Call. Thank you

## 2019-01-12 ENCOUNTER — Telehealth: Payer: Self-pay | Admitting: Neurology

## 2019-01-12 NOTE — Telephone Encounter (Signed)
Pls see phone note 11/30, looks like you tried to reach her then, thanks

## 2019-01-12 NOTE — Telephone Encounter (Signed)
Patient's POA, [name], called requesting a call back from a nurse. She said she has additional questions about the MRI (see telephone note from earlier today).

## 2019-01-12 NOTE — Telephone Encounter (Signed)
[name] aware of MRI results.

## 2019-01-13 NOTE — Telephone Encounter (Signed)
Spoke with patients sister in depth.  She is concerned that her brother is getting worse and there needs to be more testing done.  Advised her of upcoming testing and questions answered.

## 2019-01-23 ENCOUNTER — Encounter: Payer: Self-pay | Admitting: Psychology

## 2019-01-23 ENCOUNTER — Ambulatory Visit: Payer: 59 | Admitting: Psychology

## 2019-01-23 ENCOUNTER — Other Ambulatory Visit: Payer: Self-pay

## 2019-01-23 ENCOUNTER — Ambulatory Visit (INDEPENDENT_AMBULATORY_CARE_PROVIDER_SITE_OTHER): Payer: 59 | Admitting: Psychology

## 2019-01-23 DIAGNOSIS — G3184 Mild cognitive impairment, so stated: Secondary | ICD-10-CM | POA: Diagnosis not present

## 2019-01-23 HISTORY — DX: Mild cognitive impairment of uncertain or unknown etiology: G31.84

## 2019-01-23 NOTE — Progress Notes (Signed)
NEUROPSYCHOLOGICAL EVALUATION Carlos [name]. [place] Department of Neurology  Reason for Referral:   Carlos [name] is a 73 y.o. African-American male referred by Carlos [name], M.D., to characterize his current cognitive functioning and assist with diagnostic clarity and treatment planning in the context of subjective cognitive decline.  Assessment and Plan:   Clinical Impression(s): Carlos [name]' pattern of performance is suggestive of primary impairments surrounding retrieval/consolidation aspects of verbal memory, working memory, confrontation naming, and complex visuoconstructional abilities. Additional variability was seen across processing speed and executive functioning. Visual encoding (i.e., learning) was also a relative weakness. Performance was appropriate across basic attention, receptive language, verbal fluency, basic visuoconstructional abilities, verbal encoding, and visual retrieval/consolidation aspects of memory. Overall, given evidence for cognitive dysfunction, coupled with Carlos [name]' report of intact ability to complete activities of daily living (ADLs), he meets criteria for a Mild Neurocognitive Disorder (formerly "mild cognitive impairment") at the present time.  The overall etiology of his deficits are unclear. Despite learning verbal information well, Carlos [name] was amnestic across these tasks after a delay, suggesting the rapid loss of information; he additionally demonstrated deficits in confrontation naming. This could suggest prominent left temporal dysfunction assuming normal lateralization in this right-handed individual. However, other aspects of expressive language (i.e., verbal fluency) were within normal limits. The aforementioned deficits, coupled with additional weaknesses across aspects of executive functioning and more advanced visuoconstructional abilities could be concerning for Alzheimer's disease. However, appropriate retrieval/consolidation  aspects of visual memory, coupled with strong semantic fluency scores, is inconsistent with what would typically be expected. Presently, behavioral characteristics were not suggestive of any personality changes nor difficulty communicating with others, making a diagnosis on the frontotemporal dementia spectrum less likely. Continued medical monitoring moving forward will be important.  Recommendations: A repeat neuropsychological evaluation in 12 months is recommended to assess the trajectory of future cognitive decline should it occur. This will also aid in future efforts towards improved diagnostic clarity.  It will be important for Carlos [name] to have another person with him when in situations where he may need to process information, weigh the pros and cons of different options, and make decisions, in order to ensure that he fully understands and recalls all information to be considered.  If not already done, Carlos [name] and his family may want to discuss his wishes regarding durable power of attorney and medical decision making, so that he can have input into these choices. Additionally, they may wish to discuss future plans for caretaking and seek out community options for in home/residential care should they become necessary.  If interested, there are some activities which have therapeutic value and can be useful in keeping him cognitively stimulated. For suggestions, Carlos [name] is encouraged to go to the following website: https://www.[facility].org/get-to-know-barrow/centers-programs/neurorehabilitation-center/neuro-rehab-apps-and-games/ which has options, categorized by level of difficulty. It should be noted that these activities should not be viewed as a substitute for therapy.  When learning new information, he would benefit from information being broken up into small, manageable pieces. He may also find it helpful to articulate the material in his own words and in a context to promote encoding  at the onset of a new task. This material may need to be repeated multiple times to promote encoding. Important information to be remembered should be provided in written format.  To address problems with fluctuating attention, he may wish to consider:   -Avoiding external distractions when needing to concentrate   -Limiting exposure to fast paced environments with multiple  sensory demands   -Writing down complicated information and using checklists   -Attempting and completing one task at a time (i.e., no multi-tasking)   -Verbalizing aloud each step of a task to maintain focus   -Reducing the amount of information considered at one time  Review of Records:   Carlos [name] was seen by Carlos [name] [facility] Marland KitchenEllouise [name], M.D.) on 11/07/2018 for an evaluation of memory loss and frequent falls. At that time, Carlos [name] stated his belief that his memory is reasonable. He stated that his family has noticed more changes and they ask him things and "I don't give good answers." He did acknowledge short-term memory changes. His sister started noticing changes a year ago where he would forget conversations. He would also forget if he had already finished a task (e.g., that he had already come from the grocery store). She felt that symptoms had worsened over the past 5-6 months. Last month, he was visiting another sister and forgot the last portion of the trip, did not get to his destination, and ultimately just went home. He has told family he has gotten lost coming home from work. His family is also concerned about an increase in the frequency of falls. He has had 3 falls in the past 6 months. He reports injuring his foot and hitting his head with a fall in April, then hurting his back in July. He thinks it is due to clumsiness. Performance on a brief cognitive screener (MOCA) was 22/30. Points were lost across the following domains: visuospatial/executive (3/5), verbal fluency (0/1), and delayed recall (0/5).  Ultimately, Carlos [name] was referred for a comprehensive neuropsychological evaluation to characterize his cognitive abilities and to assist with diagnostic clarity and treatment planning.  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  Past Medical History:  Diagnosis Date  . Benign prostatic hyperplasia 03/30/2013   10/1 IMO update    Past Surgical History:  Procedure Laterality Date  . PROSTATE SURGERY  2004  . TONSILECTOMY, ADENOIDECTOMY, BILATERAL MYRINGOTOMY AND TUBES      Family History  Problem Relation Age of Onset  . Osteoporosis Mother   . Dementia Mother   . Diabetes Mother   . Brain cancer Father      Current Outpatient Medications:  .  dutasteride (AVODART) 0.5 MG capsule, Take by mouth., Disp: , Rfl:   Clinical Interview:   Cognitive Symptoms: Decreased short-term memory: Denied. Specifically, Mr. Passanisi did not endorse concerns regarding his memory. However, his sister expressed several concerns surrounding him getting lost while driving or exhibiting navigational difficulties, misplacing items around the home, occasionally forgetting the details of previous conversations, and repeating himself. She noted that these difficulties have been present for the past 1-1.5 years and have gradually worsened over that time.  Decreased long-term memory: Denied. Decreased attention/concentration: Unclear. When asked, Mr. Zaya expressed that he "acts on what I know" and that he generally performs tasks on his own time. Pilar Plate difficulties with maintaining his focus were not endorsed. However, possible trouble with distractibility may have been alluded to.  Reduced processing speed: Unclear. Difficulties with executive functions: Endorsed. Longstanding difficulties with disorganization and complex planning were endorsed, representing Mr. Rehfeldt' primary area of  cognitive dysfunction according to him. He noted several times feeling that he is constantly rushing but never able to catch up. His sister agreed with this, and at one point described his living arrangement  as similar to a hoarding situation where items are not placed in drawers or where they should be placed. Personality changes or poor judgment were denied.  Difficulties with emotion regulation: Denied. Difficulties with receptive language: Denied. Difficulties with word finding: Denied. Decreased visuoperceptual ability: Denied.  Difficulties completing ADLs: Denied.  Additional Medical History: History of traumatic brain injury/concussion: Denied. Medical records suggest a history of several recent falls, including one in April where Mr. Critcher hit his head. Loss in consciousness was denied, as well as persisting post-concussion symptoms.  History of stroke: Denied. History of seizure activity: Denied. History of known exposure to toxins: Denied. Symptoms of chronic pain: Denied. He did acknowledge occasional leg and ankle weakness; this was attributed to him having a job where he is on his feet for 12 hours a day. Experience of frequent headaches/migraines: Denied. Frequent instances of dizziness/vertigo: Denied.  Sensory changes: Mr. Balfanz wears glasses with positive effect. Other sensory changes/difficulties (e.g., hearing, taste, or smell) were denied. Balance/coordination difficulties: Endorsed. Mr. Denigris described his balance as "pretty good." His sister noted a history of several falls dating back to this Spring, including some where he will seemingly fall asleep while sitting on the edge of the bed, leading to him falling out. Medical records suggest that Mr. Pinkett was seen at an urgent care center on 08/24/2018 due to one of these falls causing back pain. Other motor difficulties: Denied.  Sleep History: Estimated hours obtained each night: 5-6 hours. Mr. Maisonet noted working third  shift for the [employer] and having an irregular sleep schedule as a result.  Difficulties falling asleep: Denied. Difficulties staying asleep: Denied. Feels rested and refreshed upon awakening: "More or less."  History of snoring: Denied. History of waking up gasping for air: Denied. Witnessed breath cessation while asleep: Denied.  History of vivid dreaming: Denied. Excessive movement while asleep: Denied. Instances of acting out his dreams: Denied.  Psychiatric/Behavioral Health History: Depression: Denied. He described his current mood as "pretty good." Despite this, he did allude to some distress, generally surrounding him feeling as though he needs to get more stuff done, but is largely unable due to difficulties with organization. Prior diagnoses surrounding depression or other mental health conditions was denied.  Anxiety: Denied. Mania: Denied. Trauma History: Denied. Visual/auditory hallucinations: Denied. Delusional thoughts: Denied. Mental health treatment: Endorsed. His sister reported that he has worked with a Transport planner in the distant past. However, both she and Mr. Matczak were unable to recall the reason why.   Tobacco: Denied. Alcohol: Mr. Lycans denied current alcohol consumption, as well as a history of problematic alcohol abuse or dependence. Recreational drugs: Denied. Caffeine: He reported generally consuming 1-2 cups of tea.  Academic/Vocational History: Highest level of educational attainment: 16 years. Mr. Tinder earned a Bachelor's degree in history. He described himself as a poor student in academic settings, and took longer than normal to earn his college degree. Mathematics was described as a relative weakness. History of developmental delay: Denied. History of grade repetition: Endorsed. He reported needing to repeat the 3rd grade when moving to [place] for a time. Enrollment in special education courses: Denied. History of diagnosed specific learning disability:  Denied. History of ADHD: Denied.  Employment: Mr. Mollner currently works third shift for the [employer].  Evaluation Results:   Behavioral Observations: Mr. Reineck was accompanied by his sister, arrived to his appointment on time, and was appropriately dressed and groomed. Observed gait and station were within normal limits. Gross motor functioning appeared intact upon  informal observation and no abnormal movements (e.g., tremors) were noted. While seated, he commonly appeared hunched over, looking down at his lap. However, when speaking, he would straighten himself out and maintained good eye contact. His affect was generally relaxed and positive, but did range appropriately given the subject being discussed during the clinical interview or the task at hand during testing procedures. Spontaneous speech was fluent and word finding difficulties were not observed during the clinical interview or testing procedures. Sustained attention was appropriate throughout. Thought processes were largely coherent, organized, and normal in content. However, he was tangential at times and repeated himself very often. Task engagement was adequate and he persisted when challenged. Overall, Mr. Guijosa was cooperative with the clinical interview and subsequent testing procedures.   Adequacy of Effort: The validity of neuropsychological testing is limited by the extent to which the individual being tested may be assumed to have exerted adequate effort during testing. Mr. Gates expressed his intention to perform to the best of his abilities and exhibited adequate task engagement and persistence. Scores across stand-alone and embedded performance validity measures were variable, but largely within expectation. His isolated below expectation score may be related to true cognitive dysfunction rather than poor engagement. As such, the results of the current evaluation are believed to be a valid representation of Mr. Graser' current  cognitive functioning.  Test Results: Mr. Inzunza was fully oriented at the time of the current evaluation.  Intellectual abilities based upon educational and vocational attainment were estimated to be in the average range. Premorbid abilities were estimated to be within the average range based upon a single-word reading test.   Processing speed was variable, ranging from the exceptionally low to average normative ranges. Basic attention was average. More complex attention (e.g., working memory) was exceptionally low. Executive functioning was variable, with relative weaknesses exhibited across cognitive flexibility. Mr. Leard was also unable to complete an assessment of problem solving/hypothesis testing (D-KEFS 20 Questions) as he was unable to understand task instructions.  Assessed receptive language abilities were within normal limits. Likewise, Mr. Vosburgh generally did not exhibit any difficulties comprehending task instructions (outside of 20 Questions) and answered all questions asked of him appropriately. Assessed expressive language (e.g., verbal fluency and confrontation naming) was variable. Verbal fluency was within normal limits, while confrontation naming was exceptionally low.     Assessed visuoconstructional abilities were variable. Basic abilities (e.g., Clock Drawing) were intact as points were lost only due to incorrect hand placement. However, more complex abilities (e.g., complex figure copy) were well below average and noteworthy for some spatial distortions, as well as poor planning/organization.    Learning (i.e., encoding) of novel verbal information with within normal limits, while learning visual information was well below average. Spontaneous delayed recall (i.e., retrieval) of previously learned information was variable. Retention rates were very poor across verbal measures, where Mr. Vranich was entirely amnestic. Visual retention was appropriate (80%). Performance across  recognition tasks was similar to retention scores, where verbal consolidation was very poor, while visual consolidation was appropriate.   Results of emotional screening instruments suggested that recent symptoms of generalized anxiety were in the minimal range, while symptoms of depression were in the mild range. A screening instrument assessing recent sleep quality suggested the presence of minimal sleep dysfunction.  Tables of Scores:   Note: This summary of test scores accompanies the interpretive report and should not be considered in isolation without reference to the appropriate sections in the text. Descriptors are based on appropriate  normative data and may be adjusted based on clinical judgment. The terms "impaired" and "within normal limits (WNL)" are used when a more specific level of functioning cannot be determined.       Effort Testing:   DESCRIPTOR       ACS Word Choice: --- --- Within Expectation    *Based on 73 y/o norms     Dot Counting Test: --- --- Within Expectation  CVLT-III Forced Choice Recognition: --- --- Below Expectation  BVMT-R Retention Percentage: --- --- Within Expectation       Orientation:      Raw Score Percentile   NAB Orientation, Form 1 29/29 --- ---       Intellectual Functioning:           Standard Score Percentile   Test of Premorbid Functioning: 94 34 Average       Memory:          Wechsler Memory Scale (WMS-IV):                       Raw Score (Scaled Score) Percentile     Logical Memory I 34/53 (11) 63 Average    Logical Memory II 0/39 (1) <1 Exceptionally Low    Logical Memory Recognition 14/23 3-9 Well Below Average       [place] Verbal Learning Test (CVLT-III) Brief Form: Raw Score (Scaled/Standard Score) Percentile     Total Trials 1-4 26/36 (103) 58 Average    Short-Delay Free Recall 5/9 (6) 9 Below Average    Long-Delay Free Recall 0/9 (1) <1 Exceptionally Low    Long-Delay Cued Recall 1/9 (1) <1 Exceptionally Low       Recognition Hits 7/9 (7) 16 Below Average      False Positive Errors 5 (2) <1 Exceptionally Low       Brief Visuospatial Memory Test (BVMT-R), Form 1: Raw Score (T Score) Percentile     Total Trials 1-3 12/36 (35) 7 Well Below Average    Delayed Recall 4/12 (33) 5 Well Below Average    Recognition Discrimination Index 6 >16 Within Normal Limits      Recognition Hits 6/6 >16 Within Normal Limits      False Positive Errors 0 >16 Within Normal Limits        Attention/Executive Function:          Trail Making Test (TMT): Raw Score (T Score) Percentile     Part A 44 secs.,  0 errors (49) 46 Average    Part B 192 secs.,  1 error (42) 21 Below Average        Scaled Score Percentile   WAIS-IV Coding: 8 25 Average       NAB Attention Module, Form 1: T Score Percentile     Digits Forward 49 46 Average    Digits Backwards 19 <1 Exceptionally Low       D-KEFS Color-Word Interference Test: Raw Score (Scaled Score) Percentile     Color Naming 42 secs. (7) 16 Below Average    Word Reading 41 secs. (2) <1 Exceptionally Low    Inhibition 87 secs. (8) 25 Average      Total Errors 2 errors (11) 63 Average    Inhibition/Switching 126 secs. (4) 2 Well Below Average      Total Errors 6 errors (7) 16 Below Average       D-KEFS Verbal Fluency Test: Raw Score (Scaled Score) Percentile     Letter Total Correct 27 (  8) 25 Average    Category Total Correct 35 (11) 63 Average    Category Switching Total Correct 9 (6) 9 Below Average    Category Switching Accuracy 7 (6) 9 Below Average      Total Set Loss Errors 1 (11) 63 Average      Total Repetition Errors 14 (1) <1 Exceptionally Low       D-KEFS 20 Questions Test: Scaled Score Percentile     Total Weighted Achievement Score Discontinued --- Impaired    Initial Abstraction Score --- --- ---       Language:          Verbal Fluency Test: Raw Score (T Score) Percentile     Phonemic Fluency (FAS) 27 (41) 18 Below Average    Animal Fluency 20  (56) 73 Average        NAB Language Module, Form 1: T Score Percentile     Auditory Comprehension 56 73 Average    Naming 25/31 (24) <1 Exceptionally Low       Visuospatial/Visuoconstruction:      Raw Score Percentile   Clock Drawing: 8/10 --- Within Normal Limits       NAB Spatial Module, Form 1: T Score Percentile     Figure Drawing Copy 33 5 Well Below Average        Scaled Score Percentile   WAIS-IV Matrix Reasoning: 8 25 Average       Mood and Personality:      Raw Score Percentile   Geriatric Depression Scale: 11 --- Mild  Geriatric Anxiety Scale: 11 --- Minimal    Somatic 1 --- Minimal    Cognitive 4 --- Mild    Affective 6 --- Mild       Additional Questionnaires:      Raw Score Percentile   PROMIS Sleep Disturbance Questionnaire: 12 --- None to Slight   Informed Consent and Coding/Compliance:   Mr. Mauch was provided with a verbal description of the nature and purpose of the present neuropsychological evaluation. Also reviewed were the foreseeable risks and/or discomforts and benefits of the procedure, limits of confidentiality, and mandatory reporting requirements of this provider. The patient was given the opportunity to ask questions and receive answers about the evaluation. Oral consent to participate was provided by the patient.   This evaluation was conducted by Christia Reading, Ph.D., licensed clinical neuropsychologist. Mr. Matovich completed a 30-minute clinical interview, billed as one unit [phone], and 105 minutes of cognitive testing, billed as one unit [phone] and three additional units [phone]. Psychometrist Milana Kidney, B.S., assisted Dr. Melvyn Novas with test administration and scoring procedures. As a separate and discrete service, Dr. Melvyn Novas spent a total of 180 minutes in interpretation and report writing, billed as one unit 96132 and two units 96133.

## 2019-01-23 NOTE — Progress Notes (Signed)
   Psychometrician Note   Carlos [name] completed 90 minutes of neuropsychological testing with technician, Milana Kidney, B.S., under the supervision of Dr. Christia Reading, Ph.D., licensed psychologist. The patient did not appear overtly distressed by the testing session, per behavioral observation or via self-report to the technician. Rest breaks were offered.    In considering the patient's current level of functioning, level of presumed impairment, nature of symptoms, emotional and behavioral responses during the interview, level of literacy, and observed level of motivation/effort, a battery of tests was selected and communicated to the psychometrician.   Communication between the psychologist and technician was ongoing throughout the testing session and changes were made as deemed necessary based on patient performance on testing, technician observations and additional pertinent factors such as those listed above.   Carlos [name] will return within approximately two weeks for an interactive feedback session with Dr. Melvyn Novas at which time his test performances, clinical impressions, and treatment recommendations will be reviewed in detail. The patient understands he can contact our office should he require our assistance before this time.  90 minutes were spent face-to-face with patient administering standardized tests and 15 minutes were spent scoring (technician). [CPT [id number], [id number]  This note reflects time spent with the psychometrician and does not include test scores or any clinical interpretations made by Dr. Melvyn Novas. The full report will follow in a separate note.

## 2019-02-13 ENCOUNTER — Encounter: Payer: 59 | Admitting: Psychology

## 2019-02-17 ENCOUNTER — Ambulatory Visit (INDEPENDENT_AMBULATORY_CARE_PROVIDER_SITE_OTHER): Payer: 59 | Admitting: Psychology

## 2019-02-17 ENCOUNTER — Other Ambulatory Visit: Payer: Self-pay

## 2019-02-17 DIAGNOSIS — G3184 Mild cognitive impairment, so stated: Secondary | ICD-10-CM

## 2019-02-17 NOTE — Progress Notes (Signed)
   Neuropsychology Feedback Session Tillie Rung. [place] Department of Neurology  Reason for Referral:   Carlos [name] a 74 y.o. African-American male referred by Ellouise Newer, M.D.,to characterize hiscurrent cognitive functioning and assist with diagnostic clarity and treatment planning in the context of subjective cognitive decline.  Feedback:   Carlos [name] completed a comprehensive neuropsychological evaluation on 01/23/2019. Please refer to that encounter for the full report and recommendations. Briefly, results suggested primary impairments surrounding retrieval/consolidation aspects of verbal memory, working memory, confrontation naming, and complex visuoconstructional abilities. Additional variability was seen across processing speed and executive functioning. Visual encoding (i.e., learning) was also a relative weakness. Overall, given evidence for cognitive dysfunction, coupled with Carlos [name]' report of intact ability to complete activities of daily living (ADLs), he meets criteria for a mild neurocognitive disorder (formerly "mild cognitive impairment"). The overall etiology of his deficits are unclear. The aforementioned deficits surrounding memory, coupled with additional weaknesses across aspects of executive functioning and more advanced visuoconstructional abilities could be concerning for Alzheimer's disease. However, appropriate retrieval/consolidation aspects of visual memory, coupled with strong semantic fluency scores, is inconsistent with what would typically be expected.  Carlos [name] was accompanied by his daughter during the current virtual appointment. Content of the current session focused on the results of his neuropsychological evaluation. Carlos [name] and his daughter were given the opportunity to ask questions and their questions were answered. They were also encouraged to reach out should additional questions arise.     Between 30 and 60 minutes were  spent preparing for and conducting the current feedback session with Carlos [name], billed as one unit [phone].

## 2019-02-18 ENCOUNTER — Encounter: Payer: 59 | Admitting: Psychology

## 2019-03-18 ENCOUNTER — Telehealth: Payer: Self-pay | Admitting: Neurology

## 2019-03-18 NOTE — Telephone Encounter (Signed)
Patient's sister called regarding wanting to get a sooner appointment. Patient has another appointment with Dr. Delice Lesch to follow up after Dr. Melvyn Novas in April. He saw Dr. Melvyn Novas in 02/2019. She feels he needs to be seen sooner due to his Memory. Please Advise. Thank you

## 2019-03-18 NOTE — Telephone Encounter (Signed)
I can do a virtual or phone visit with him on Friday 2/12 at 1pm, thanks

## 2019-03-18 NOTE — Telephone Encounter (Signed)
Spoke with pt sister the POA she was asking if they could have the Carlos [name] follow up Appointment  moved up she stated that "Dr. Melvyn Novas told them he would try to get the appointment moved up for them after the test was done" she also stated that [name] work best for the pt because he works mon- thrus, but Feb 26th and March 12th are not good he as something on those days, she said his memory isn't good but he works everyday." they are wanting something to help slow down the memory loss, she said he looses stuff all the time,

## 2019-03-20 ENCOUNTER — Telehealth (INDEPENDENT_AMBULATORY_CARE_PROVIDER_SITE_OTHER): Payer: 59 | Admitting: Neurology

## 2019-03-20 ENCOUNTER — Encounter: Payer: Self-pay | Admitting: Neurology

## 2019-03-20 ENCOUNTER — Other Ambulatory Visit: Payer: Self-pay

## 2019-03-20 VITALS — Ht 65.0 in | Wt 134.0 lb

## 2019-03-20 DIAGNOSIS — G3184 Mild cognitive impairment, so stated: Secondary | ICD-10-CM

## 2019-03-20 DIAGNOSIS — R296 Repeated falls: Secondary | ICD-10-CM | POA: Diagnosis not present

## 2019-03-20 NOTE — Progress Notes (Signed)
Virtual Visit via Video Note The purpose of this virtual visit is to provide medical care while limiting exposure to the novel coronavirus.    Consent was obtained for video visit:  Yes.   Answered questions that patient had about telehealth interaction:  Yes.   I discussed the limitations, risks, security and privacy concerns of performing an evaluation and management service by telemedicine. I also discussed with the patient that there may be a patient responsible charge related to this service. The patient expressed understanding and agreed to proceed.  Pt location: Home Physician Location: office Name of referring provider:  [name], MD I connected with Carlos [name] at patients initiation/request on 03/20/2019 at  74:00 PM EST by video enabled telemedicine application and verified that I am speaking with the correct person using two identifiers. Pt MRN:  [id number] Pt DOB:  [birth year] Video Participants:  Carlos [name];  Carlos [name] (sister, POA); Carlos [name] (sister)   History of Present Illness:  The patient was seen as a virtual video visit on 03/20/2019. His sisters Carlos [name] and Carlos [name] are present during the e-visit to provide additional information. He was last seen in the neurology clinic 4 months ago for memory loss. Records and images were personally reviewed. 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 He underwent Neuropsychological testing in 01/2019 indicating Mild Neurocognitive disorder. There were "impairments in retrieval/consolidation aspects of verbal memory, working memory, confrontation naming, and complex visuoconstructional abilities. Additional variability was seen across processing speed and executive functioning. Visual encoding (i.e., learning) was also a relative weakness.  Findings could suggest prominent left temporal dysfunction assuming normal lateralization in this right-handed individual. However, other aspects of expressive language (i.e., verbal fluency) were within normal limits. The aforementioned deficits, coupled with additional weaknesses across aspects of executive functioning and more advanced visuoconstructional abilities could be concerning for Alzheimer's disease. However appropriate retrieval/consolidation aspects of visual memory, coupled with strong semantic fluency scores, is inconsistent with what would typically be expected."  His family is very concerned about his cognitive status. They are confused about the above report, stating their concerns about difficulties with complex tasks/day to day activities. 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 Carlos [name] reports he has gotten lost coming and going to work, he does not recall this. He does not recall prior conversations. He has had 4 falls in the past 8 months and has missed a few days at work, but does not remember this. It is hard for him to believe these things are true, but he does understand his sisters' concerns. They report he has had 2 or 2 wallets, he cannot keep any type of order. They state he has not been a very orderly person, but this is much worse. He gets frustrated, saying he can remember the past well. Carlos [name] reports he has been unable to find her house of 15 years that he has been to several times. He lives with his mother who has a caregiver. They report that with one of the falls, he may have lost consciousness. He had fallen on his desk and broke his glasses, he has not recollection of the fall. According to the caregiver, he was awake when she arrived, it is unclear if he was confused. He denies any headaches, dizziness, vision changes. Sleep is good.  No hallucinations or paranoia.   History on Initial Assessment 11/07/2018: This is a pleasant 74 year old  right-handed man with no significant past medical history presenting for evaluation of memory loss and frequent falls. His sister is present to provide additional information. He thinks his memory is reasonable. He states family has noticed more changes, they ask him things and "I don't give good answers." He endorses short-term memory changes. He lives with his 28 year old mother who has 4 caregivers. 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 He denies getting lost driving. He denies any missed bill payments. He is not on any medications. He denies any difficulties at work, he states it is routine work. His sister started noticing changes a year ago, he would forget conversations. He would forget he had already finished a task, for instance that he had already came from the grocery store. She feels symptoms worsened in the past 5-6 months. Last month, he was visiting another sister and forgot the last portion of the trip, he did not get to his destination and just went home. He has told family he has gotten lost coming home from work, he arrived home an hour later (he has been in the same place for 7 years). His family is also concerned about an increase in frequency of falls. He has had 3 falls in the past 6 months. He reports injuring his foot and hitting his head with a fall in April, then hurting his back in July. He thinks it is due to clumsiness. No loss of consciousness. They are concerned about the possibility of a brain tumor, their father had a brain tumor. There is a history of memory loss in his mother and younger brother (secondary to stroke). He denies any history of significant head injuries or alcohol use.   He denies any headaches, dizziness, diplopia, dysarthria, dysphagia, neck/back pain, focal numbness/tingling/weakness, bowel/bladder dysfunction, anosmia, or tremors. He felt unwell last night and BP was 151/86, he went home early from work. He states he is on his feet 12 hours a day, and  he is now required to work 6 days a week (mandatory due to Covid-19 changes), he was previously working 5 days a week. His sister is concerned he is not eating well, mostly sugars and hotdogs. He is overly tired and sleepy some days. Mood is good, he has "always been hyper, but now it it more" per sister. No hallucinations or paranoia.   Outpatient Encounter Medications as of 03/20/2019  Medication Sig  . NON FORMULARY 1 tablet daily. Mind works       No facility-administered encounter medications on file as of 03/20/2019.     Observations/Objective:   Vitals:   03/20/19 0904  Weight: 134 lb (60.8 kg)  Height: 5\' 5"  (1.651 m)   GEN:  The patient appears stated age and is in NAD.  Neurological examination: Patient is awake, alert, oriented x 3. No aphasia or dysarthria. Intact fluency and comprehension. Remote and recent memory impaired. Cranial nerves: Extraocular movements intact with no nystagmus. No facial asymmetry. Motor: moves all extremities symmetrically, at least anti-gravity x 4.   Assessment and Plan:   This is a pleasant 74 yo RH man with worsening memory. MRI brain no acute changes, there is mild diffuse atrophy and chronic microvascular disease. Neuropsychological testing in December 2020 showed Mild Neurocognitive Disorder, etiology unclear, he had impairments in retrieval/consolidation of verbal memory, working memory, confrontation naming, and complex visuoconstructional abilities. There were  features that can be seen with Alzheimer's, but also some features that were inconsistent. Repeat testing in a year recommended to assess trajectory. His sisters, however are very concerned about difficulties with complex tasks, which would indicate a diagnosis of dementia. We discussed starting Donepezil, would do an EKG first. We discussed speech therapy for cognitive therapy. We also discussed the falls, there may be loss of consciousness. Proceed with EKG, as well as EEG. Consider  driving evaluation. All their questions were answered, continue close clinical monitoring. Follow-up in 2-3 months, they know to call for any changes.    Follow Up Instructions:   -I discussed the assessment and treatment plan with the patient. The patient was provided an opportunity to ask questions and all were answered. The patient agreed with the plan and demonstrated an understanding of the instructions.   The patient was advised to call back or seek an in-person evaluation if the symptoms worsen or if the condition fails to improve as anticipated.     Cameron Sprang, MD

## 2019-04-03 ENCOUNTER — Other Ambulatory Visit: Payer: Self-pay

## 2019-04-03 ENCOUNTER — Emergency Department (HOSPITAL_COMMUNITY): Payer: 59

## 2019-04-03 ENCOUNTER — Emergency Department (HOSPITAL_COMMUNITY)
Admission: EM | Admit: 2019-04-03 | Discharge: 2019-04-03 | Disposition: A | Discharge status: Home / Self Care | Payer: 59 | Attending: Emergency Medicine | Admitting: Emergency Medicine

## 2019-04-03 DIAGNOSIS — M25512 Pain in left shoulder: Secondary | ICD-10-CM | POA: Insufficient documentation

## 2019-04-03 DIAGNOSIS — R29898 Other symptoms and signs involving the musculoskeletal system: Secondary | ICD-10-CM | POA: Diagnosis not present

## 2019-04-03 DIAGNOSIS — R200 Anesthesia of skin: Secondary | ICD-10-CM | POA: Insufficient documentation

## 2019-04-03 DIAGNOSIS — Z20822 Contact with and (suspected) exposure to covid-19: Secondary | ICD-10-CM | POA: Insufficient documentation

## 2019-04-03 DIAGNOSIS — R531 Weakness: Secondary | ICD-10-CM | POA: Diagnosis present

## 2019-04-03 LAB — I-STAT CHEM 8, ED
BUN: 11 mg/dL (ref 8–23)
Calcium, Ion: 1.13 mmol/L — ABNORMAL LOW (ref 1.15–1.40)
Chloride: 102 mmol/L (ref 98–111)
Creatinine, Ser: 0.7 mg/dL (ref 0.61–1.24)
Glucose, Bld: 96 mg/dL (ref 70–99)
HCT: 40 % (ref 39.0–52.0)
Hemoglobin: 13.6 g/dL (ref 13.0–17.0)
Potassium: 3.8 mmol/L (ref 3.5–5.1)
Sodium: 137 mmol/L (ref 135–145)
TCO2: 25 mmol/L (ref 22–32)

## 2019-04-03 LAB — COMPREHENSIVE METABOLIC PANEL
ALT: 23 U/L (ref 0–44)
AST: 23 U/L (ref 15–41)
Albumin: 3.8 g/dL (ref 3.5–5.0)
Alkaline Phosphatase: 72 U/L (ref 38–126)
Anion gap: 10 (ref 5–15)
BUN: 10 mg/dL (ref 8–23)
CO2: 23 mmol/L (ref 22–32)
Calcium: 8.7 mg/dL — ABNORMAL LOW (ref 8.9–10.3)
Chloride: 104 mmol/L (ref 98–111)
Creatinine, Ser: 0.84 mg/dL (ref 0.61–1.24)
GFR calc Af Amer: >60 mL/min (ref >60)
GFR calc non Af Amer: >60 mL/min (ref >60)
Glucose, Bld: 102 mg/dL — ABNORMAL HIGH (ref 70–99)
Potassium: 3.8 mmol/L (ref 3.5–5.1)
Sodium: 137 mmol/L (ref 135–145)
Total Bilirubin: 1.1 mg/dL (ref 0.3–1.2)
Total Protein: 6.4 g/dL — ABNORMAL LOW (ref 6.5–8.1)

## 2019-04-03 LAB — CBC
HCT: 42.4 % (ref 39.0–52.0)
Hemoglobin: 13.7 g/dL (ref 13.0–17.0)
MCH: 29.1 pg (ref 26.0–34.0)
MCHC: 32.3 g/dL (ref 30.0–36.0)
MCV: 90 fL (ref 80.0–100.0)
Platelets: 230 10*3/uL (ref 150–400)
RBC: 4.71 MIL/uL (ref 4.22–5.81)
RDW: 12.8 % (ref 11.5–15.5)
WBC: 8.2 10*3/uL (ref 4.0–10.5)
nRBC: 0 % (ref 0.0–0.2)

## 2019-04-03 LAB — URINALYSIS, ROUTINE W REFLEX MICROSCOPIC
Bilirubin Urine: NEGATIVE
Glucose, UA: NEGATIVE mg/dL
Hgb urine dipstick: NEGATIVE
Ketones, ur: 5 mg/dL — AB
Leukocytes,Ua: NEGATIVE
Nitrite: NEGATIVE
Protein, ur: NEGATIVE mg/dL
Specific Gravity, Urine: 1.011 (ref 1.005–1.030)
pH: 5 (ref 5.0–8.0)

## 2019-04-03 LAB — DIFFERENTIAL
Abs Immature Granulocytes: 0.03 10*3/uL (ref 0.00–0.07)
Basophils Absolute: 0 10*3/uL (ref 0.0–0.1)
Basophils Relative: 0 %
Eosinophils Absolute: 0 10*3/uL (ref 0.0–0.5)
Eosinophils Relative: 0 %
Immature Granulocytes: 0 %
Lymphocytes Relative: 14 %
Lymphs Abs: 1.2 10*3/uL (ref 0.7–4.0)
Monocytes Absolute: 0.4 10*3/uL (ref 0.1–1.0)
Monocytes Relative: 5 %
Neutro Abs: 6.6 10*3/uL (ref 1.7–7.7)
Neutrophils Relative %: 81 %

## 2019-04-03 LAB — RAPID URINE DRUG SCREEN, HOSP PERFORMED
Amphetamines: NOT DETECTED
Barbiturates: NOT DETECTED
Benzodiazepines: NOT DETECTED
Cocaine: NOT DETECTED
Opiates: NOT DETECTED
Tetrahydrocannabinol: NOT DETECTED

## 2019-04-03 LAB — ETHANOL: Alcohol, Ethyl (B): <10 mg/dL (ref <10)

## 2019-04-03 LAB — RESPIRATORY PANEL BY RT PCR (FLU A&B, COVID)
Influenza A by PCR: NEGATIVE
Influenza B by PCR: NEGATIVE
SARS Coronavirus 2 by RT PCR: NEGATIVE

## 2019-04-03 LAB — PROTIME-INR
INR: 1 (ref 0.8–1.2)
Prothrombin Time: 13.3 seconds (ref 11.4–15.2)

## 2019-04-03 LAB — APTT: aPTT: 24 seconds (ref 24–36)

## 2019-04-03 IMAGING — MR MR CERVICAL SPINE W/O CM
4 of 5 series · 19 of 48 positions shown · non-contrast
Comparison: None.

CLINICAL DATA: Left upper extremity weakness

EXAM:
MRI CERVICAL SPINE WITHOUT CONTRAST
TECHNIQUE: Multiplanar, multisequence MR imaging of the cervical spine was
performed. No intravenous contrast was administered.

[Series 2: T2 · sagittal · 3.0mm · 0.43mm/px · 6 of 14 slices shown (1 of 2)]
[im 1/14]
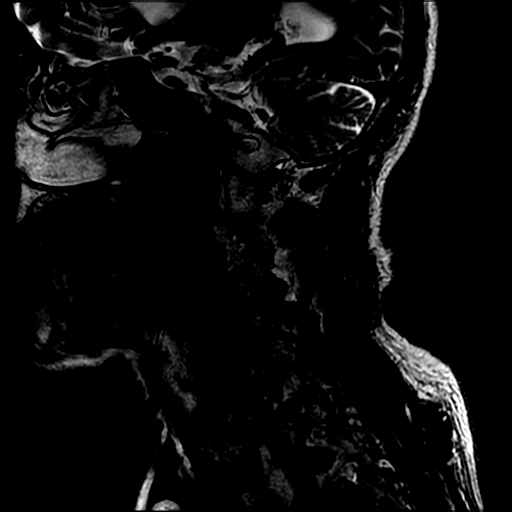
[im 3/14]
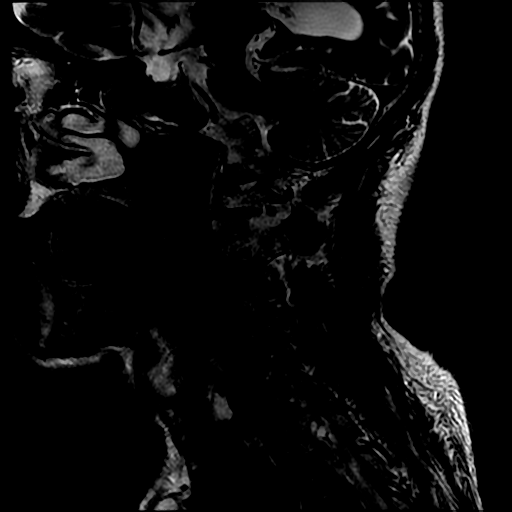
[im 6/14]
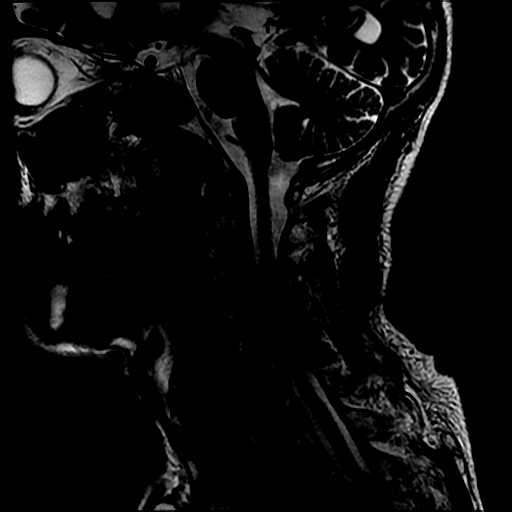
[im 8/14]
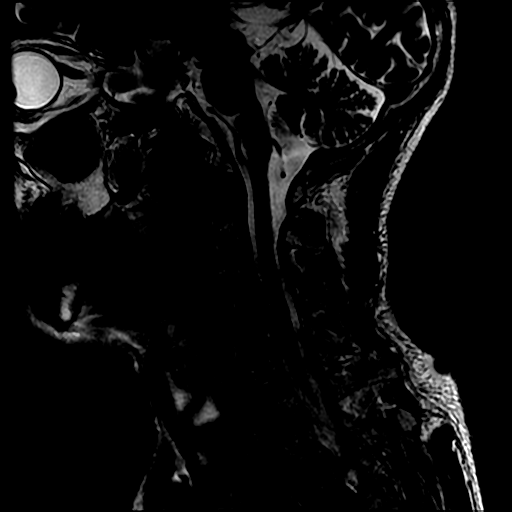
[im 11/14]
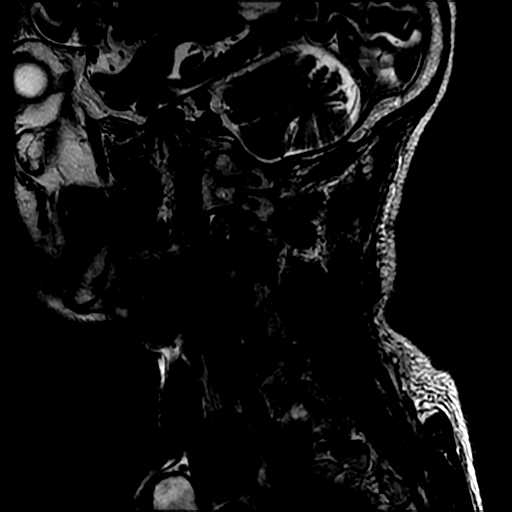
[im 14/14]
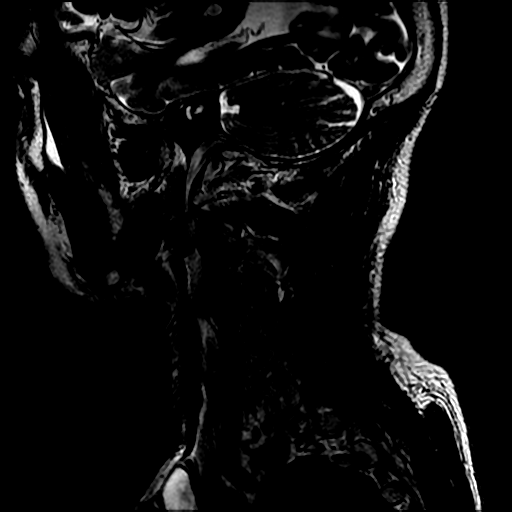

[Series 3: T1 · sagittal · 3.0mm · 0.43mm/px · 3 of 14 slices shown]
[im 3/14]
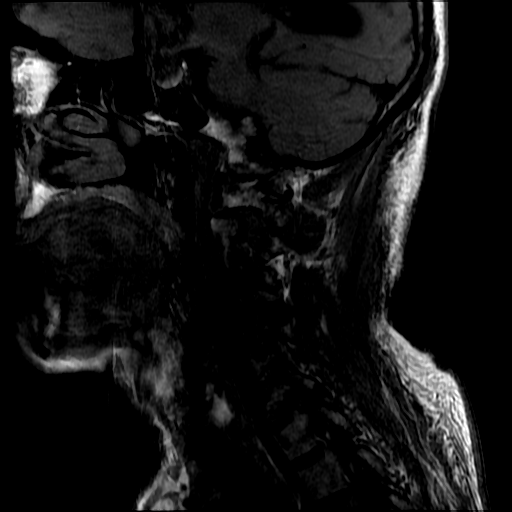
[im 7/14]
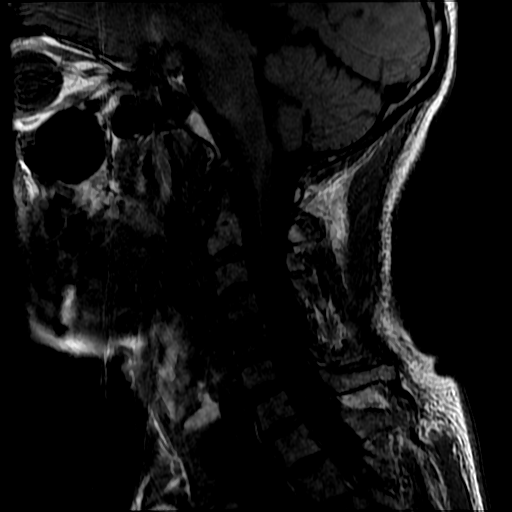
[im 11/14]
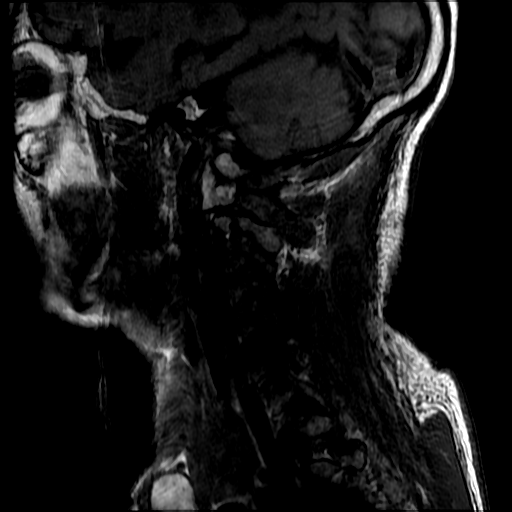

[Series 4: sag ir · sagittal · 3.0mm · 0.43mm/px · 3 of 14 slices shown]
[im 3/14]
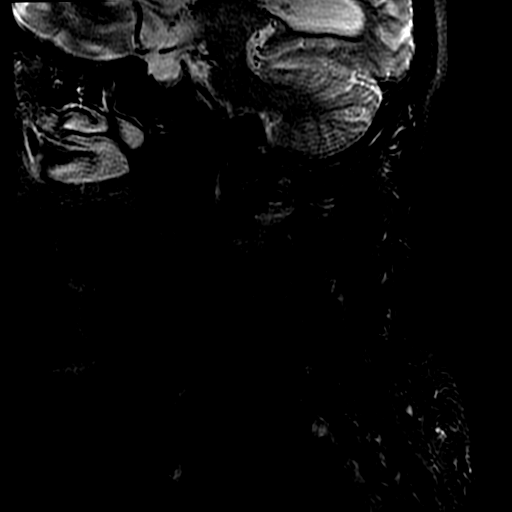
[im 7/14]
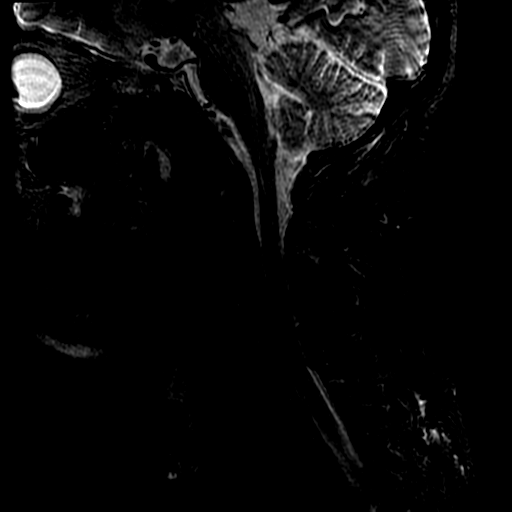
[im 11/14]
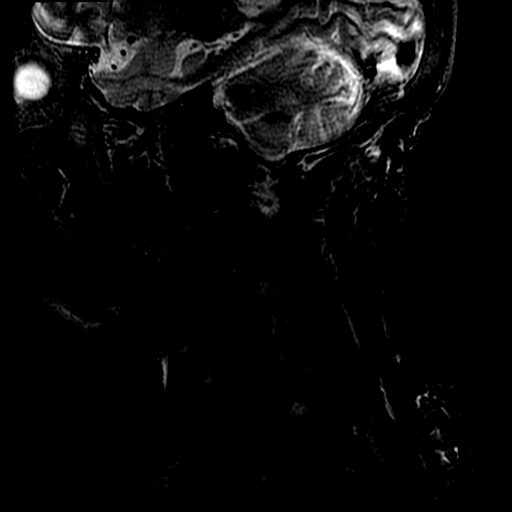

[Series 6: T2 · axial · 3.0mm · 0.39mm/px · z∈[-53,+15]mm · 7 of 28 slices shown (2 of 2)]
[im 1/28]
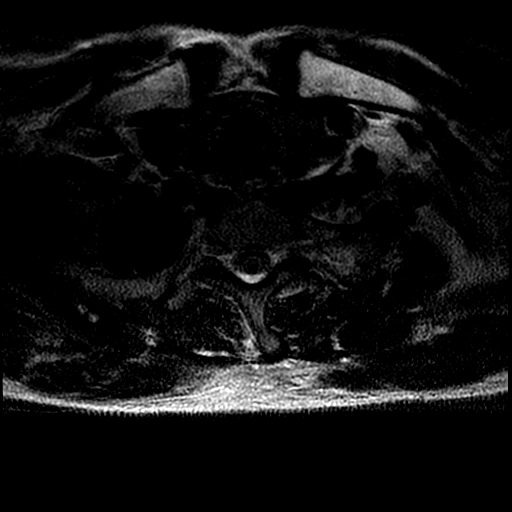
[im 5/28]
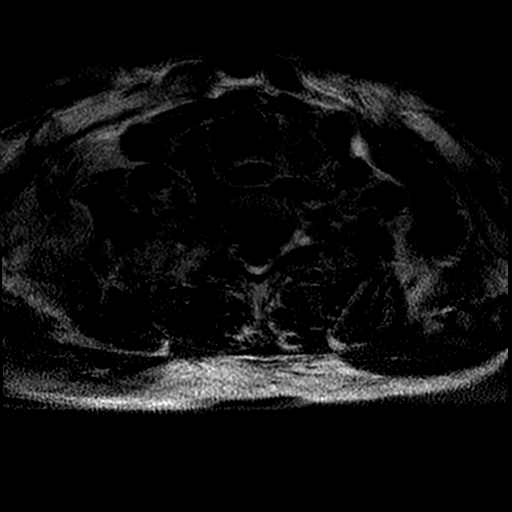
[im 9/28]
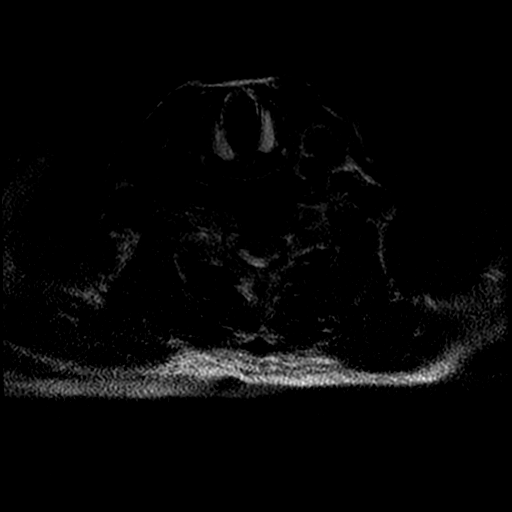
[im 13/28]
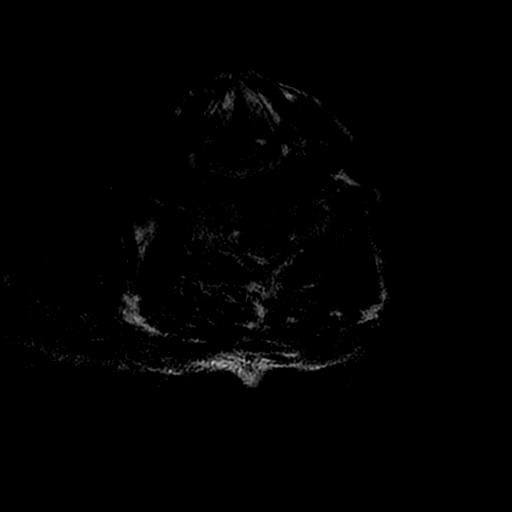
[im 15/28]
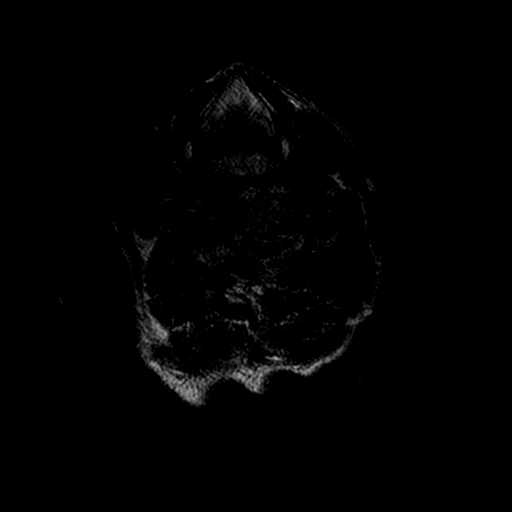
[im 19/28]
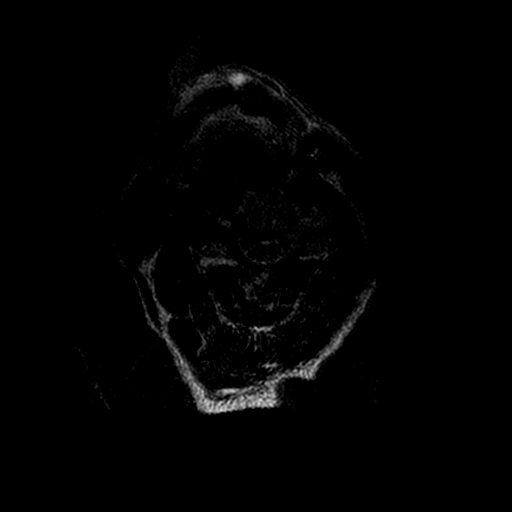
[im 23/28]
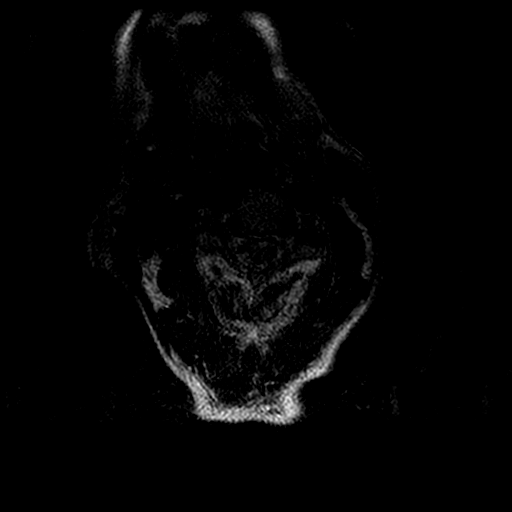

[19 of 48 positions shown; findings below may reference images not displayed]

FINDINGS: Patient motion degrades image quality limiting evaluation.

Alignment: Physiologic.

Vertebrae: No fracture, evidence of discitis, or bone lesion.

Cord: Normal signal and morphology.

Posterior Fossa, vertebral arteries, paraspinal tissues: Posterior
fossa demonstrates no focal abnormality. Vertebral artery flow voids
are maintained. Paraspinal soft tissues are unremarkable.

Disc levels:

Discs: Degenerative disease with disc height loss at C5-6.

C2-3: No significant disc bulge. No neural foraminal stenosis. No
central canal stenosis.

C3-4: Minimal broad-based disc bulge. Bilateral uncovertebral
degenerative changes. Moderate bilateral foraminal stenosis. No
central canal stenosis.

C4-5: Broad-based disc bulge. Left uncovertebral degenerative
changes. Moderate right and mild left foraminal stenosis. No central
canal stenosis.

C5-6: Broad-based disc bulge. Bilateral uncovertebral degenerative
changes. Moderate bilateral foraminal stenosis, left greater than
right. No central canal stenosis.

C6-7: Broad-based disc bulge. No neural foraminal stenosis. No
central canal stenosis.

C7-T1: Broad-based disc bulge. Moderate right foraminal stenosis. No
left foraminal stenosis. Moderate right facet arthropathy. No
central canal stenosis.
IMPRESSION: 1. Diffuse cervical spine spondylosis as described above.
2.  No acute osseous injury of the cervical spine.

## 2019-04-03 IMAGING — DX DG CHEST 1V PORT
1 series · 1 of 1 positions shown · non-contrast
Comparison: None.

CLINICAL DATA: Left arm pain radiating into the left upper chest.

EXAM:
PORTABLE CHEST 1 VIEW

[chest ap]
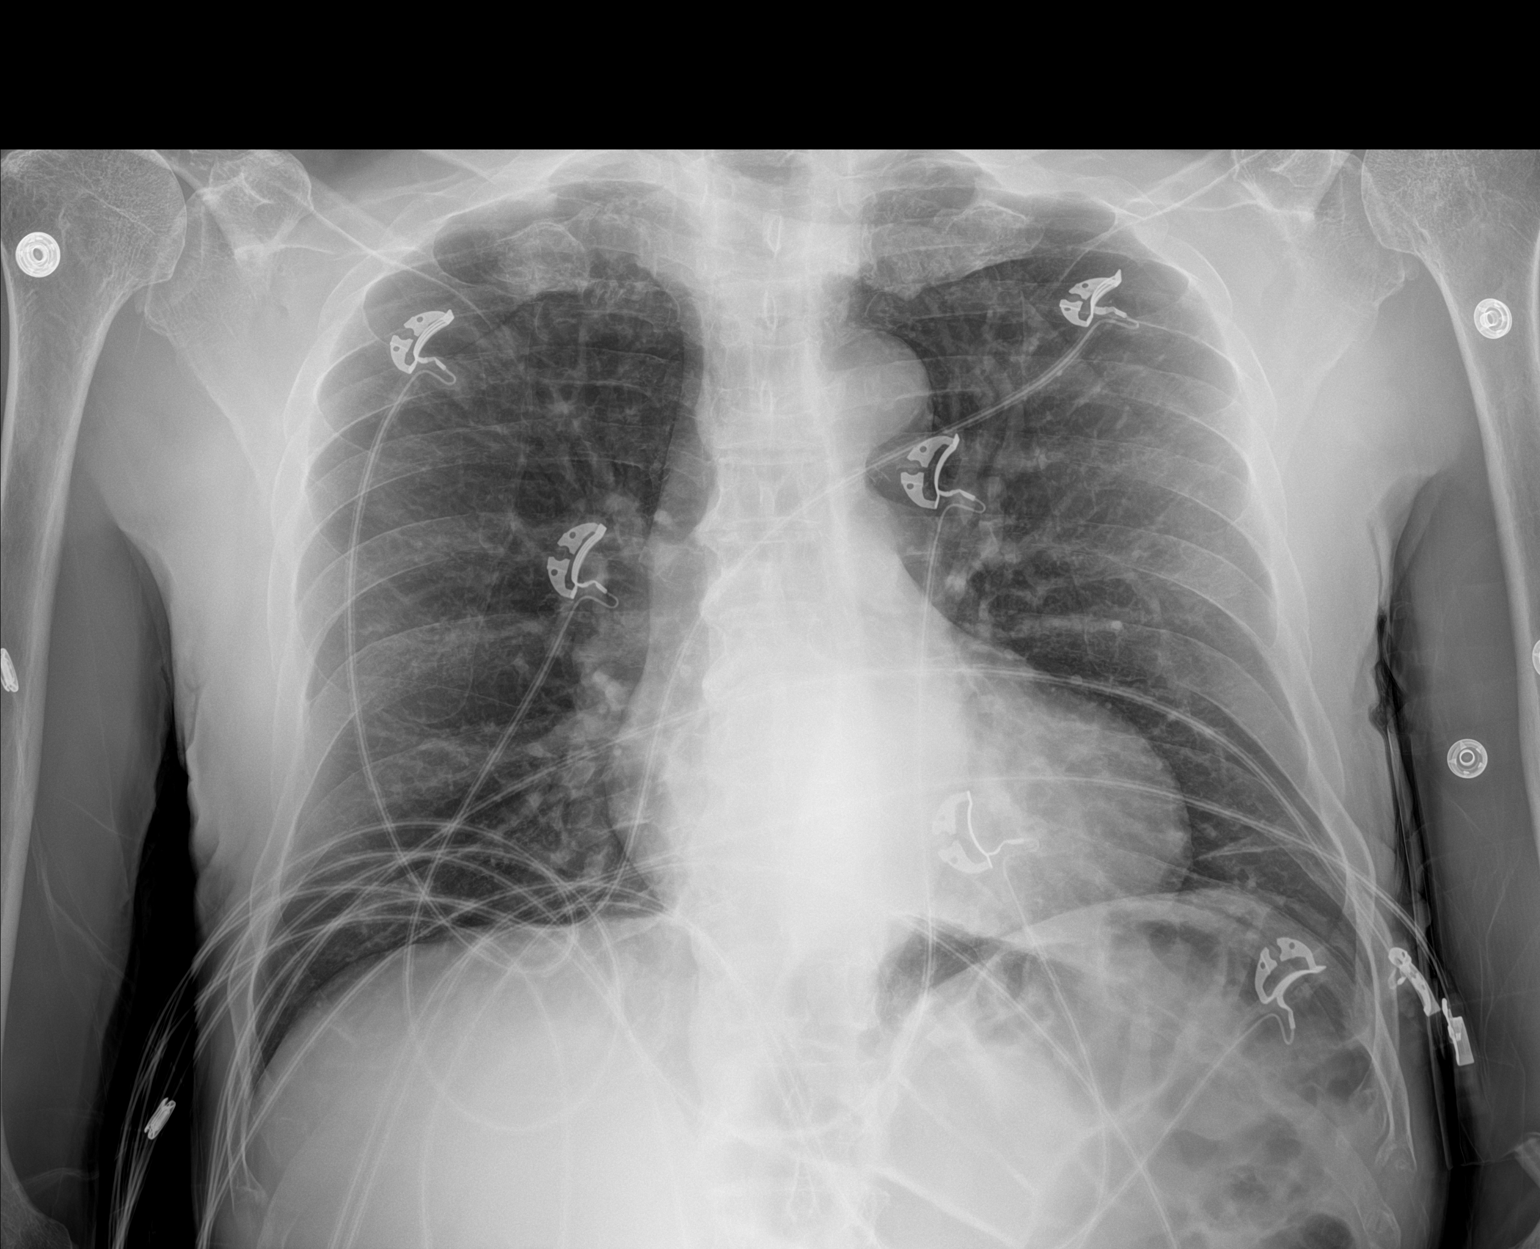

[1 of 1 positions shown; findings below may reference images not displayed]

FINDINGS: The heart size and mediastinal contours are within normal limits.
Both lungs are clear. The visualized skeletal structures are
unremarkable.
IMPRESSION: No active disease.

## 2019-04-03 IMAGING — MR MR HEAD W/O CM
9 of 10 series · 38 of 48 positions shown · non-contrast
Comparison: Brain MRI [DATE].

CLINICAL DATA: Neuro deficit, acute, stroke suspected. Additional
history obtained from electronic medical record: Stiffness and
weakness in left arm.

EXAM:
MRI HEAD WITHOUT CONTRAST
TECHNIQUE: Multiplanar, multiecho pulse sequences of the brain and surrounding
structures were obtained without intravenous contrast.

[Series 6: DWI · axial · 3.0mm · 1.09mm/px · z∈[-82,+81]mm · 11 of 112 slices shown (1 of 4)]
[im 1/112]
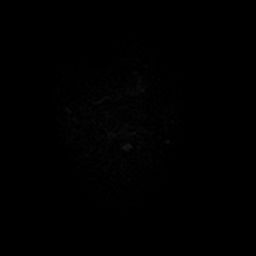
[im 12/112]
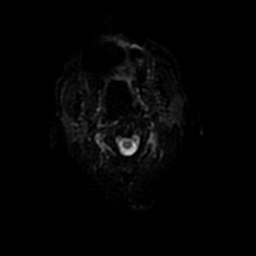
[im 23/112]
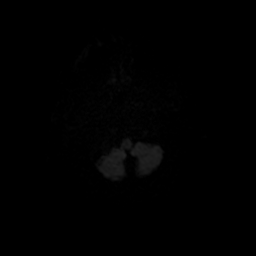
[im 34/112]
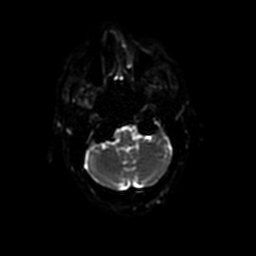
[im 45/112]
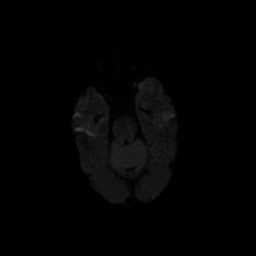
[im 56/112]
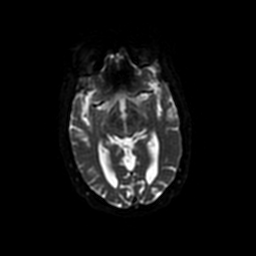
[im 67/112]
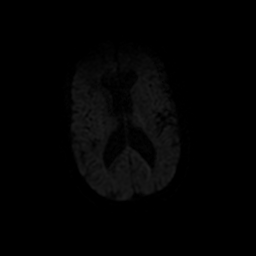
[im 78/112]
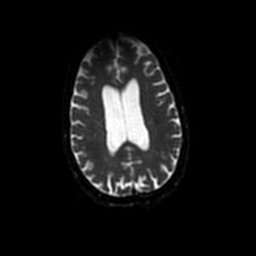
[im 89/112]
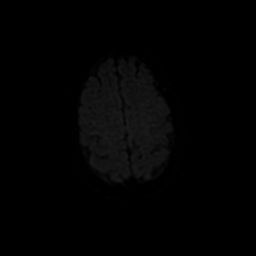
[im 100/112]
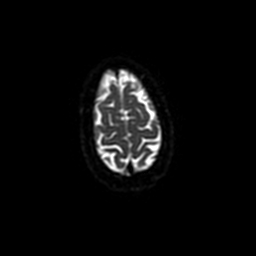
[im 112/112]
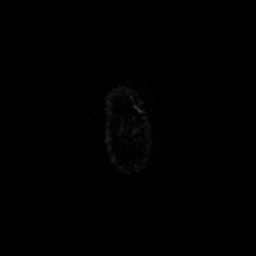

[Series 7: DWI · coronal · 5.0mm · 1.09mm/px · 8 of 78 slices shown (2 of 4)]
[im 1/78]
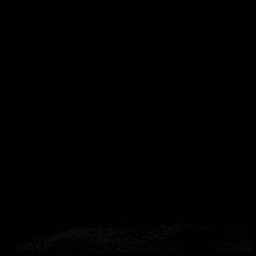
[im 12/78]
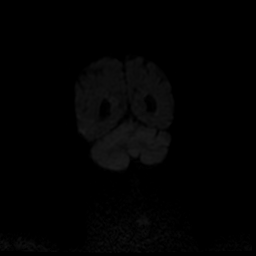
[im 23/78]
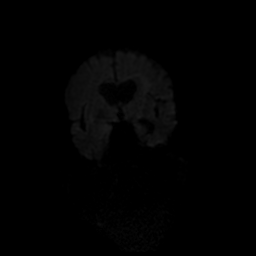
[im 34/78]
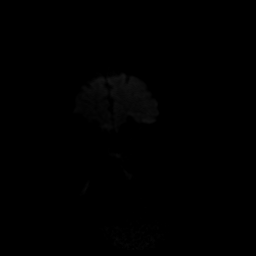
[im 45/78]
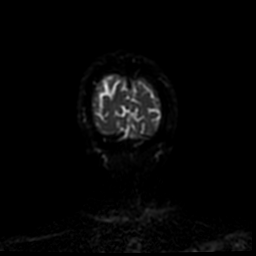
[im 56/78]
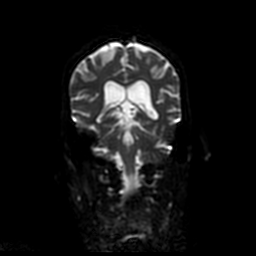
[im 67/78]
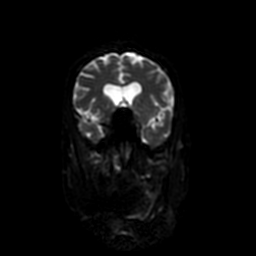
[im 78/78]
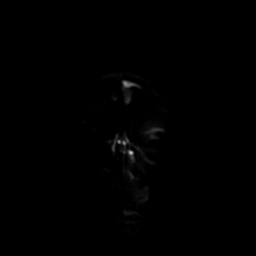

[Series 8: T1 · sagittal · 5.0mm · 0.47mm/px · 2 of 23 slices shown (1 of 2)]
[im 1/23]
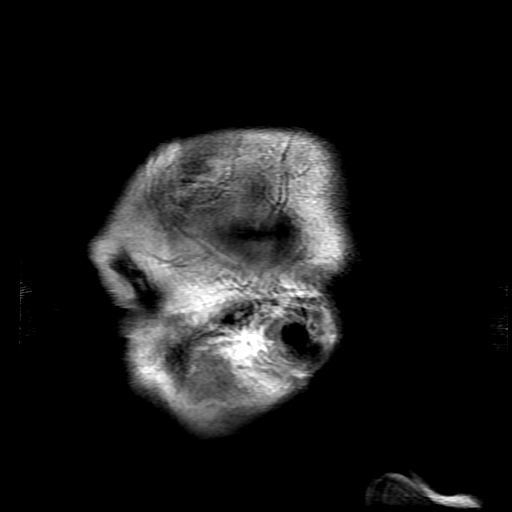
[im 23/23]
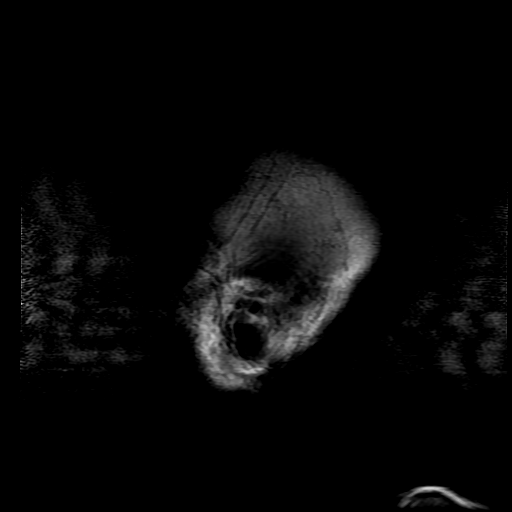

[Series 9: T2 · axial · 5.0mm · 0.43mm/px · z∈[-72,+76]mm · 2 of 26 slices shown (1 of 2)]
[im 1/26]
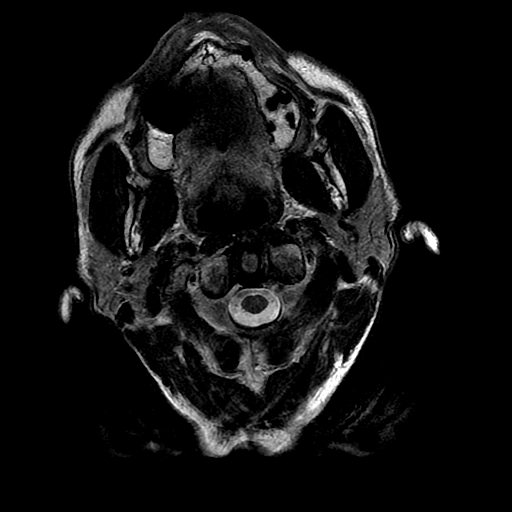
[im 26/26]
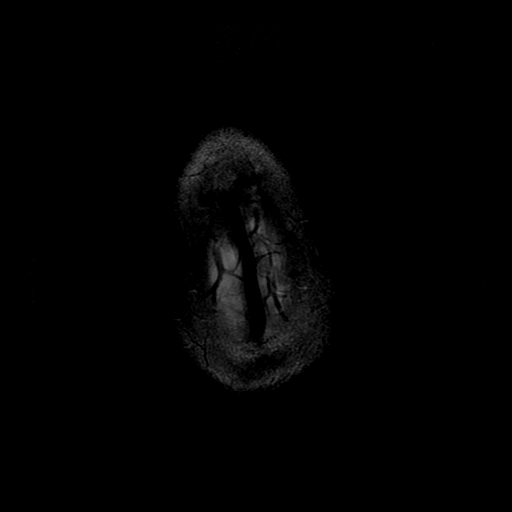

[Series 11: FLAIR · axial · 5.0mm · 0.43mm/px · z∈[-72,+76]mm · 2 of 26 slices shown]
[im 1/26]
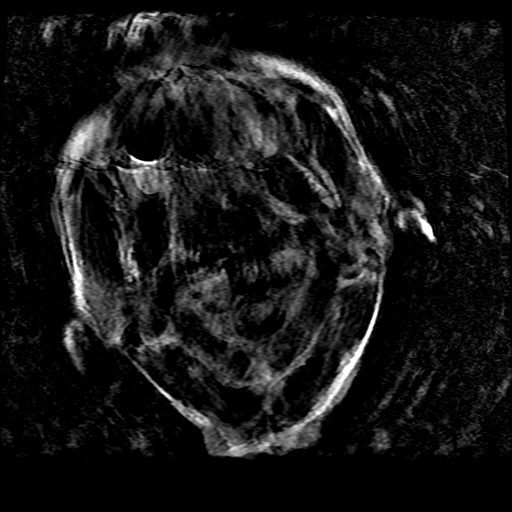
[im 26/26]
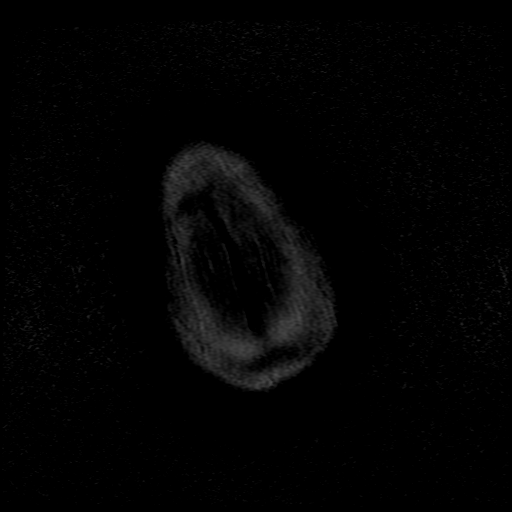

[Series 12: T1 · axial · 3.0mm · 0.43mm/px · z∈[-74,-56]mm · 2 of 112 slices shown (2 of 2)]
[im 1/112]
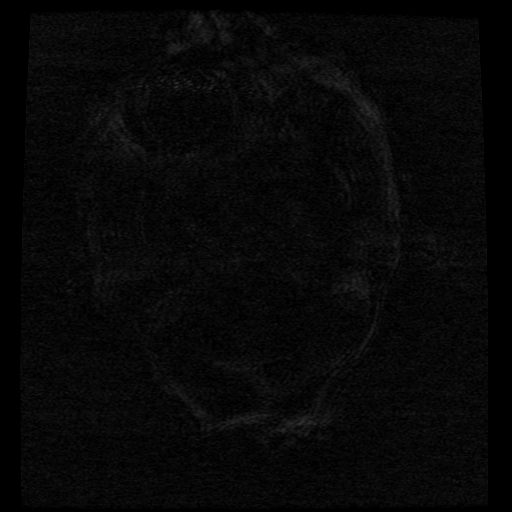
[im 13/112]
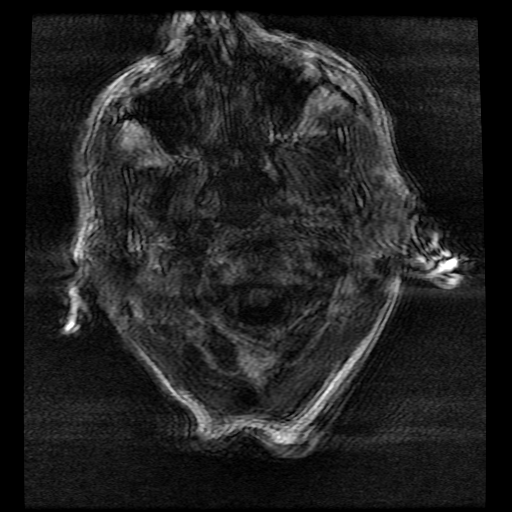

[Series 13: T2 · coronal · 5.0mm · 0.39mm/px · 2 of 25 slices shown (2 of 2)]
[im 1/25]
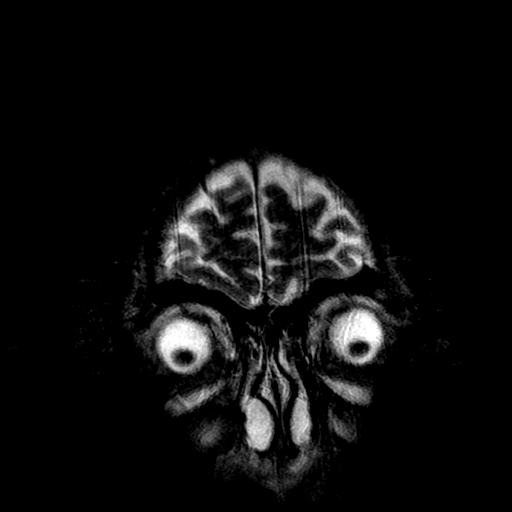
[im 25/25]
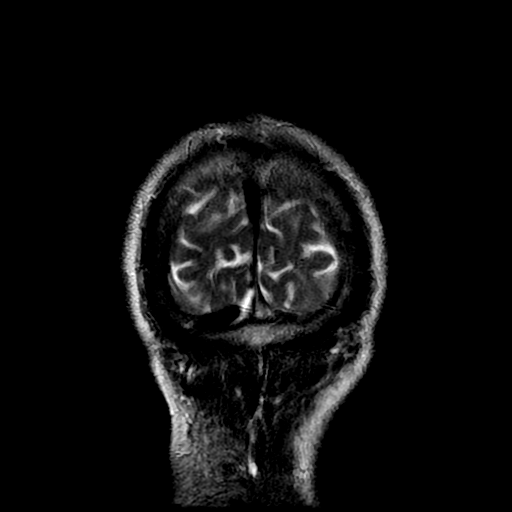

[Series 600: DWI · axial · 3.0mm · 1.09mm/px · z∈[-82,+81]mm · 5 of 56 slices shown (3 of 4)]
[im 1/56]
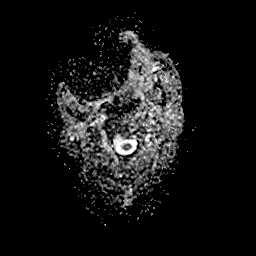
[im 14/56]
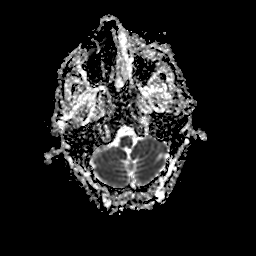
[im 28/56]
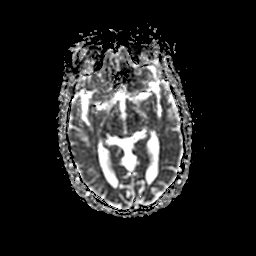
[im 42/56]
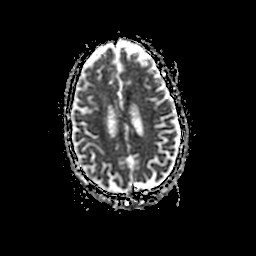
[im 56/56]
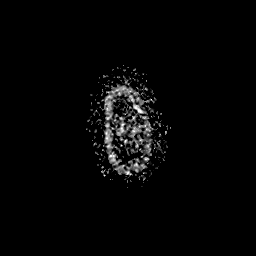

[Series 700: DWI · coronal · 5.0mm · 1.09mm/px · 4 of 39 slices shown (4 of 4)]
[im 1/39]
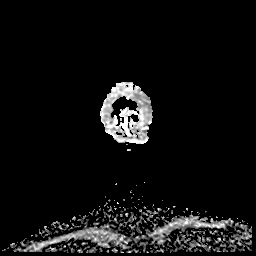
[im 13/39]
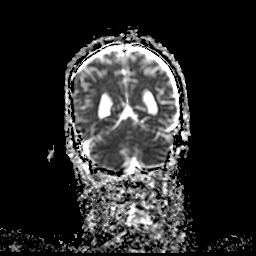
[im 26/39]
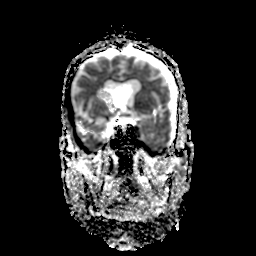
[im 39/39]
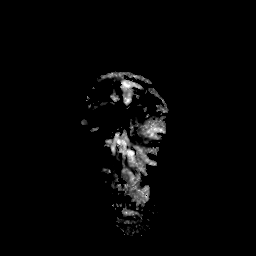

[38 of 48 positions shown; findings below may reference images not displayed]

FINDINGS: Brain:

Multiple sequences are significantly motion degraded, limiting
evaluation. Most notably, there is moderate motion degradation of
the sagittal T1 weighted sequence, moderate motion degradation of
the axial SWI sequence, severe motion degradation of the axial T2
FLAIR sequence, and severe motion degradation of the axial T1
weighted sequence. There is also mild motion degradation of the
axial and coronal diffusion-weighted imaging.

Within the limitations of motion degradation, there is no convincing
evidence of acute infarct.

No evidence of intracranial mass.

No midline shift or extra-axial fluid collection.

A few small scattered foci of T2/FLAIR hyperintensity within the
cerebral white matter appear similar to prior MRI [DATE],
although assessment on the current study is limited by motion
degradation.

Redemonstrated chronic microhemorrhage versus cavernoma within the
left cerebellum (series 10, image 8).

Stable, mild generalized parenchymal atrophy.

Vascular: Flow voids maintained within the proximal large arterial
vessels.

Skull and upper cervical spine: No focal marrow lesion identified
within the limitations of motion degradation.

Sinuses/Orbits: Visualized orbits demonstrate no acute abnormality.
Minimal ethmoid sinus mucosal thickening. No significant mastoid
effusion
IMPRESSION: 1. Significantly motion degraded and limited examination as
described.
2. No evidence of acute intracranial abnormality.
3. Redemonstrated left cerebellar chronic microhemorrhage versus
cavernoma.
4. Mild chronic small vessel ischemic disease, poorly assessed on
the current study due to the degree of motion degradation.
5. Stable, mild generalized parenchymal atrophy.

## 2019-04-03 IMAGING — DX DG SHOULDER 2+V*L*
3 series · 3 of 3 positions shown · non-contrast
Comparison: None.

CLINICAL DATA: Pain and stiffness and weakness.

EXAM:
LEFT SHOULDER - 2+ VIEW

[shoulder axial]
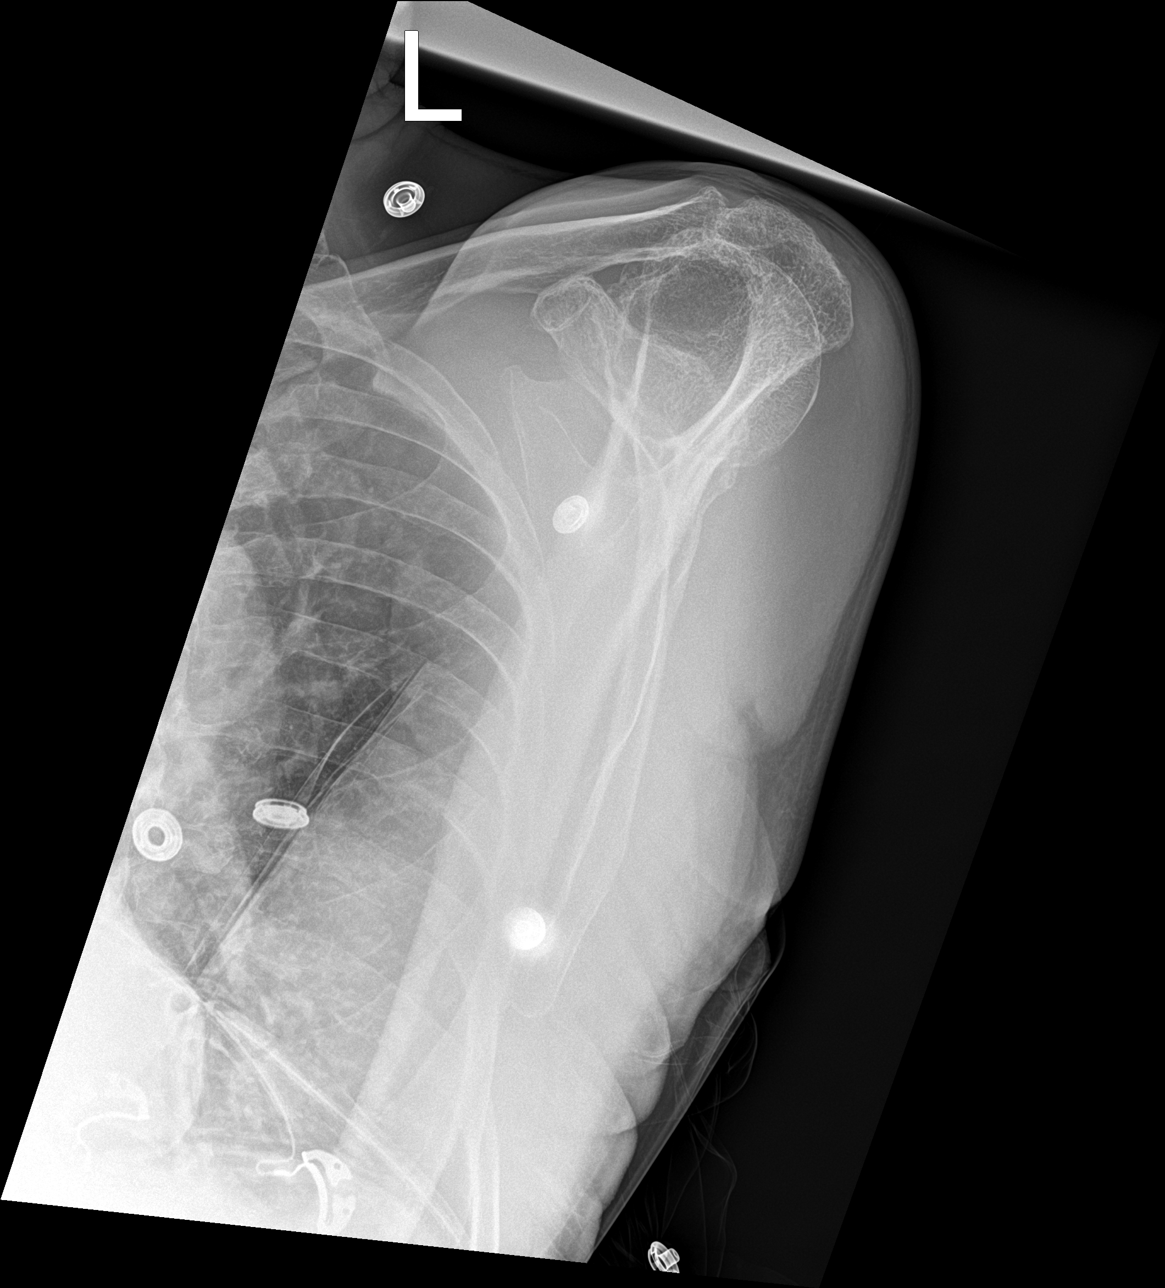

[shoulder ap]
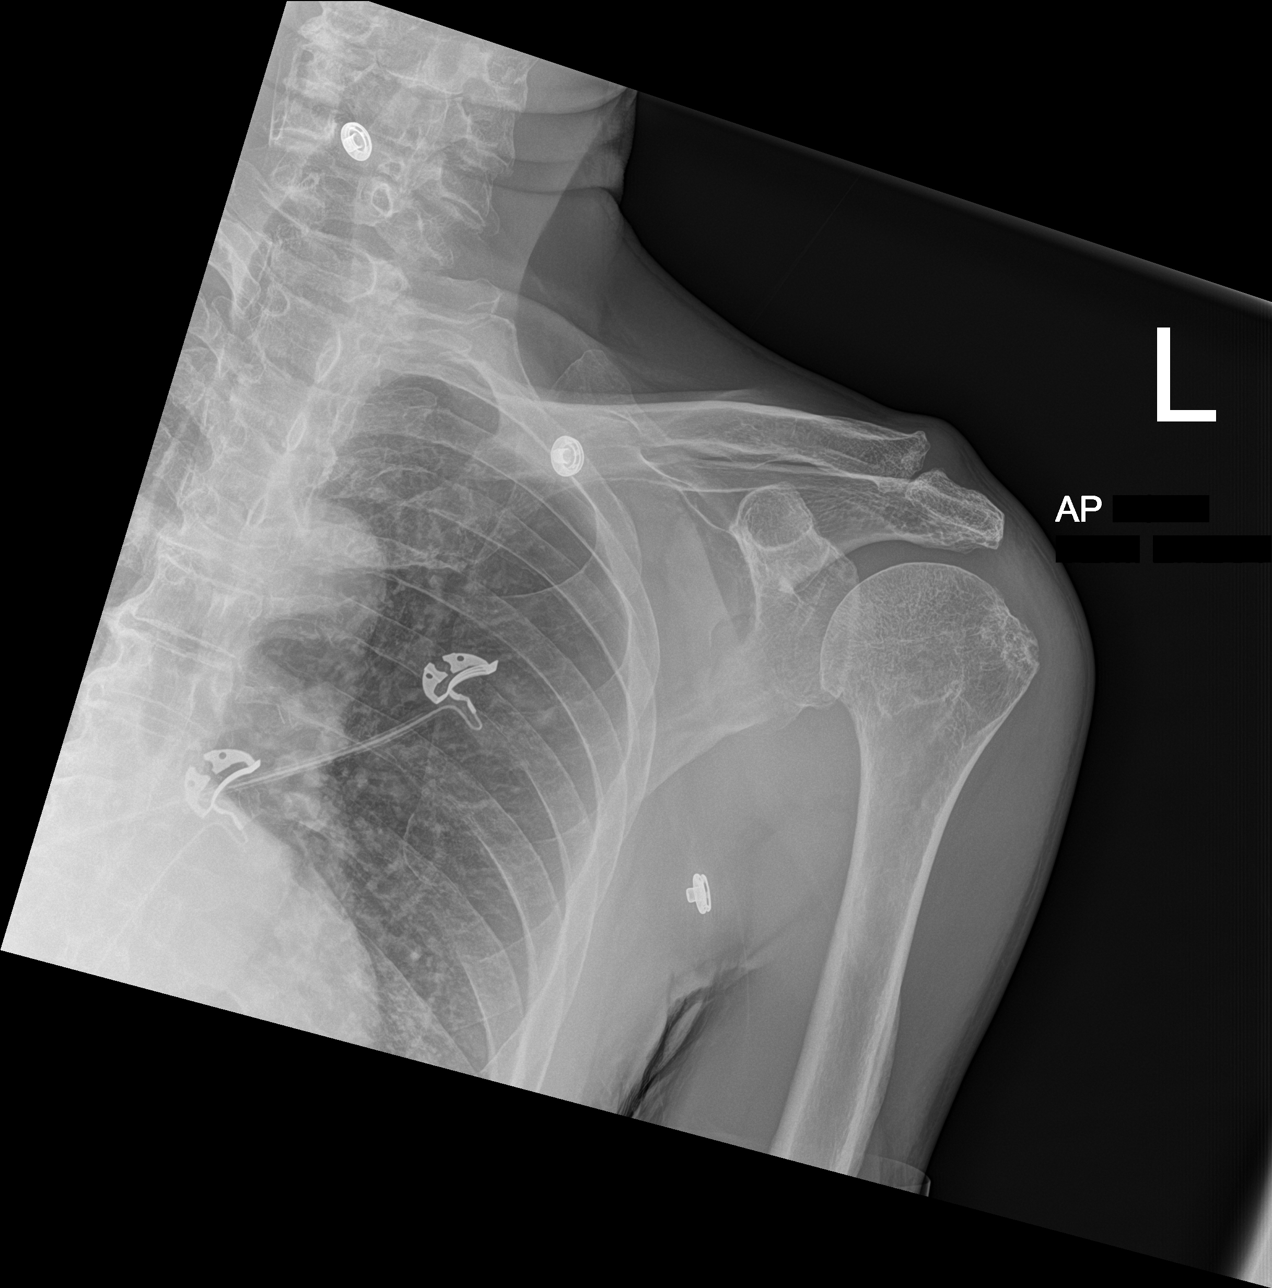

[shoulder obl]
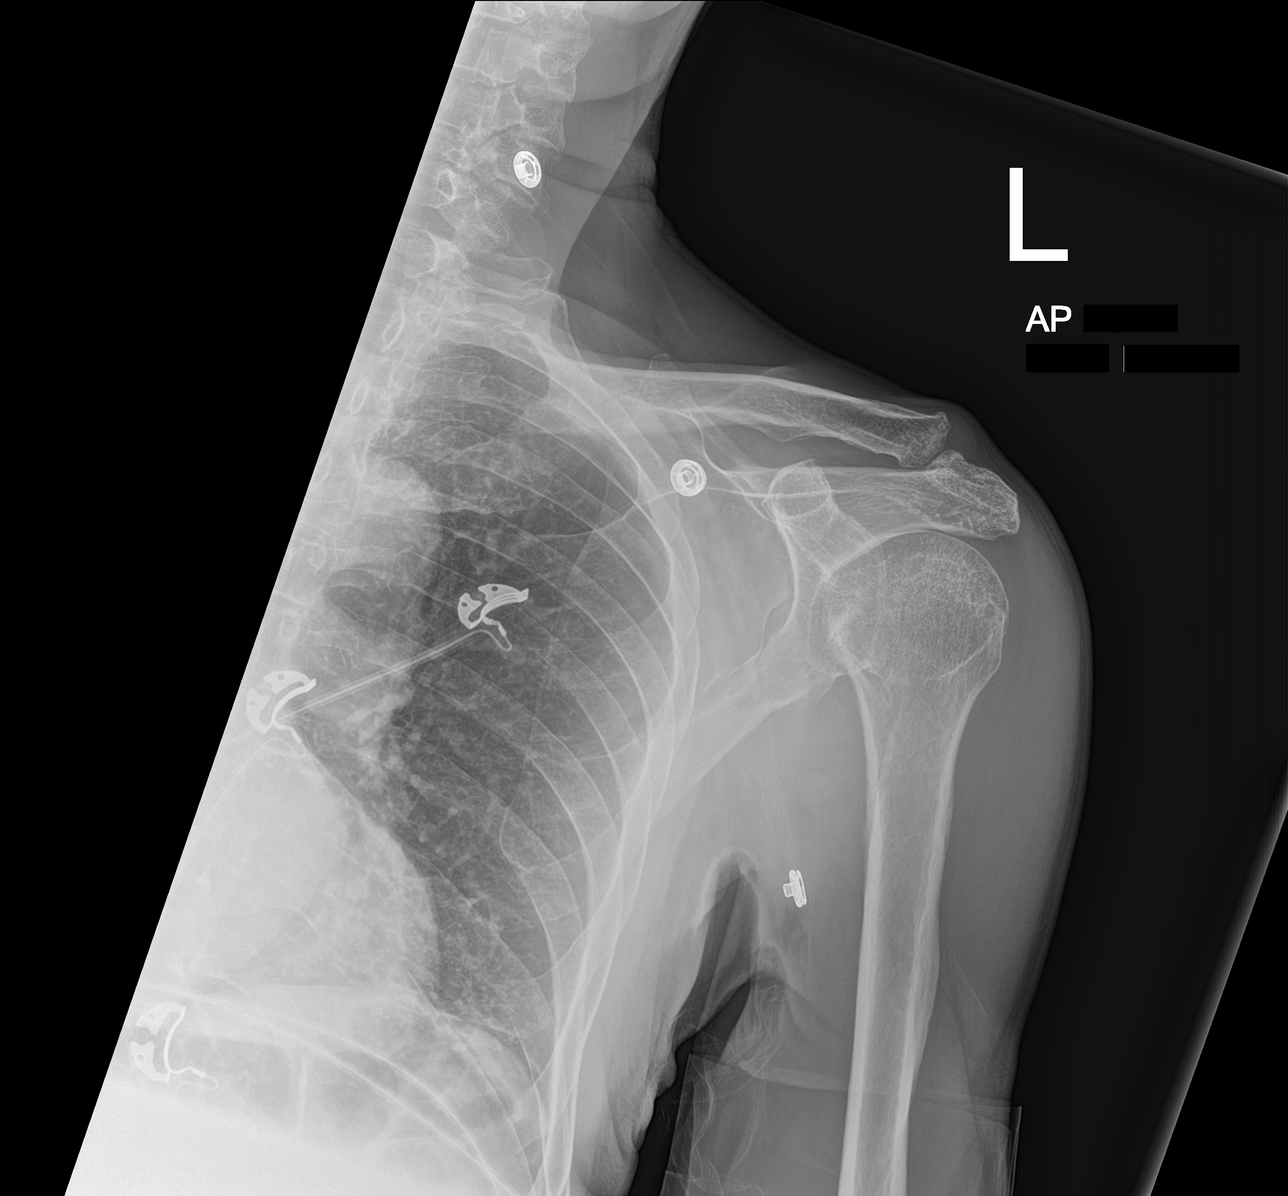

[3 of 3 positions shown; findings below may reference images not displayed]

FINDINGS: There is no evidence of fracture or dislocation. Slight degenerative
changes of the greater tuberosity of the proximal humerus and of the
AC joint. Soft tissues are unremarkable.
IMPRESSION: No acute abnormality. Slight degenerative changes.

## 2019-04-03 NOTE — ED Notes (Signed)
Pt back from MRI 

## 2019-04-03 NOTE — ED Notes (Signed)
Pt to MRI

## 2019-04-03 NOTE — ED Triage Notes (Signed)
Pt here from home for L arm weakness that he woke up with this morning at 1115. LSN 1100 last night when he went to bed. Endorses pain with movement in the arm.

## 2019-04-03 NOTE — Discharge Instructions (Signed)
You may have a frozen shoulder.  I recommended taking 1 week off of work to rest your shoulder, and following up with an orthopedic doctor.  Their office number is circled in your discharge papers.  While you were here, you received a stroke/mini stroke workup, with concern for possible left arm "weakness."   Your CT scan and MRI did not show evidence of a stroke.  Your MRI was limited somewhat by movement, but even with these limitations, our neurologist did not see evidence of a stroke to explain your symptoms.  Therefore, we felt you are more likely suffering from a frozen shoulder causing some weakness and pain in your left upper arm.

## 2019-04-03 NOTE — ED Notes (Signed)
Call the brother Tanuj Tenzer when patient is ready to go home he will be the one picking him up [phone]

## 2019-04-03 NOTE — ED Provider Notes (Signed)
Clinical Course as of Apr 03 1911  Fri Apr 03, 2019  1624 Pt signed out ot me by Dr Wyvonnia Dusky.  Briefly 74 yo male presenting from GI suite today for outpatient procedure with left arm "weakness" since this morning.  Neurology team requested MRI brain and cervical spine imaging, which are ordered.  Plan to f/u imaging, low suspicion for septic joint.  Minor temp/fever here, covid test ordered, no respiratory symptoms.   [MT]    Clinical Course User Index [MT] Wyvonnia Dusky, MD   Discussed case with Dr Rory Percy who reviewed MRI, and states despite motion artifact no evidence of acute stroke in region of brain corresponding with patients' reported symptoms.  C-spine shows some DDD and bulging disc, but no severe compression of the nerve root.  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  I'll have him f/u with orthopedics.       Wyvonnia Dusky, MD 04/03/19 [phone]

## 2019-04-03 NOTE — ED Provider Notes (Signed)
[facility]ERG[facility] DEPARTMENT Provider Note   CSN: [id number] Arrival date & time: 04/03/19  1354     History No chief complaint on file.   Regniald Yeiser is a 74 y.o. male.  Patient sent from endoscopy with left arm weakness since this morning.  He was supposed to have a screening colonoscopy this morning and he reported to staff there that he was having "stiffness" and weakness in his left arm since this morning.  His last seen normal was about 11 PM last night.  His colonoscopy was canceled and he was sent over by GI staff to rule out stroke.  He denies any fall or recent injury.  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  He is right-handed.  He describes "stiffness" and weakness in his left proximal arm but no weakness or numbness distally.  He denies any head, neck, back, chest or abdominal pain.  No difficulty speaking or difficulty swallowing.  No leg pain or leg swelling. He states there is some pain associated with movement of the arm as well but he has weakness when he tries to lift his shoulder and weakness in his bicep and tricep.  He has never had this problem before.  The history is provided by the patient.       Past Medical History:  Diagnosis Date  . Benign prostatic hyperplasia 03/30/2013   10/1 IMO update  . 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 Benign prostatic hyperplasia 03/30/2013    Past Surgical History:  Procedure Laterality Date  . PROSTATE SURGERY  2004  . TONSILECTOMY, ADENOIDECTOMY, BILATERAL MYRINGOTOMY AND TUBES         Family History  Problem Relation Age of Onset  . Osteoporosis Mother   . Dementia Mother   . Diabetes Mother   . Brain cancer Father     Social History   Tobacco Use  . Smoking status: Never Smoker  . Smokeless tobacco: Never Used  Substance  Use Topics  . Alcohol use: No  . Drug use: No    Home Medications Prior to Admission medications   Medication Sig Start Date End Date Taking? Authorizing Provider  NON FORMULARY 1 tablet daily. Mind works    Secondary school teacher, Historical, MD    Allergies    Patient has no known allergies.  Review of Systems   Review of Systems  Constitutional: Negative for activity change, appetite change and fever.  HENT: Negative for congestion.   Respiratory: Negative for cough, chest tightness and shortness of breath.   Cardiovascular: Negative for chest pain and leg swelling.  Gastrointestinal: Negative for abdominal pain, nausea and vomiting.  Genitourinary: Negative for dysuria and hematuria.  Musculoskeletal: Negative for arthralgias, back pain, myalgias and neck pain.  Skin: Negative for rash.  Neurological: Positive for weakness and numbness. Negative for dizziness and headaches.   all other systems are negative except as noted in the HPI and PMH.    Physical Exam Updated Vital Signs BP 136/72   Pulse 70   Temp (!) 100.4 F (38 C)   Resp 17   SpO2 100%   Physical Exam Vitals and nursing note reviewed.  Constitutional:      General: He is not in acute distress.    Appearance: He is well-developed.  HENT:     Head: Normocephalic and atraumatic.  Mouth/Throat:     Pharynx: No oropharyngeal exudate.  Eyes:     Conjunctiva/sclera: Conjunctivae normal.     Pupils: Pupils are equal, round, and reactive to light.  Neck:     Comments: No meningismus. Cardiovascular:     Rate and Rhythm: Normal rate and regular rhythm.     Heart sounds: Normal heart sounds. No murmur.  Pulmonary:     Effort: Pulmonary effort is normal. No respiratory distress.     Breath sounds: Normal breath sounds.  Abdominal:     Palpations: Abdomen is soft.     Tenderness: There is no abdominal tenderness. There is no guarding or rebound.  Musculoskeletal:        General: No swelling, tenderness or  deformity. Normal range of motion.     Cervical back: Normal range of motion and neck supple.     Comments: Left shoulder appears normal to inspection.  There is no significant erythema, warmth or effusion.  Patient with weakness trying to raise against gravity.  Using right arm to lift left arm.  Skin:    General: Skin is warm.  Neurological:     Mental Status: He is alert and oriented to person, place, and time.     Cranial Nerves: No cranial nerve deficit.     Motor: No abnormal muscle tone.     Coordination: Coordination normal.     Comments: Cranial nerves II to XII intact, no facial droop.  Able to shrug shoulders bilaterally.  Weakness with shoulder abduction and abduction on the left. Weakness with forearm flexion extension of the left. Intact grip strength bilaterally.  Intact radial pulses.  5/5 strength of lower extremities.  Psychiatric:        Behavior: Behavior normal.     ED Results / Procedures / Treatments   Labs (all labs ordered are listed, but only abnormal results are displayed) Labs Reviewed  COMPREHENSIVE METABOLIC PANEL - Abnormal; Notable for the following components:      Result Value   Glucose, Bld 102 (*)    Calcium 8.7 (*)    Total Protein 6.4 (*)    All other components within normal limits  URINALYSIS, ROUTINE W REFLEX MICROSCOPIC - Abnormal; Notable for the following components:   APPearance CLOUDY (*)    Ketones, ur 5 (*)    All other components within normal limits  I-STAT CHEM 8, ED - Abnormal; Notable for the following components:   Calcium, Ion 1.13 (*)    All other components within normal limits  RESPIRATORY PANEL BY RT PCR (FLU A&B, COVID)  ETHANOL  PROTIME-INR  APTT  CBC  DIFFERENTIAL  RAPID URINE DRUG SCREEN, HOSP PERFORMED    EKG EKG Interpretation  Date/Time:  Friday April 03 2019 14:23:42 EST Ventricular Rate:  88 PR Interval:    QRS Duration: 93 QT Interval:  352 QTC Calculation: 426 R Axis:   69 Text  Interpretation: Sinus rhythm Anteroseptal infarct, old Borderline repolarization abnormality No previous ECGs available Confirmed by Ezequiel Essex [phone]) on 04/03/2019 2:40:16 PM   Radiology DG Chest Portable 1 View  Result Date: 04/03/2019 CLINICAL DATA:  Left arm pain radiating into the left upper chest. EXAM: PORTABLE CHEST 1 VIEW COMPARISON:  None. FINDINGS: The heart size and mediastinal contours are within normal limits. Both lungs are clear. The visualized skeletal structures are unremarkable. IMPRESSION: No active disease. Electronically Signed   By: Lorriane Shire M.D.   On: 04/03/2019 16:03   DG Shoulder Left  Result  Date: 04/03/2019 CLINICAL DATA:  Pain and stiffness and weakness. EXAM: LEFT SHOULDER - 2+ VIEW COMPARISON:  None. FINDINGS: There is no evidence of fracture or dislocation. Slight degenerative changes of the greater tuberosity of the proximal humerus and of the [facility] joint. Soft tissues are unremarkable. IMPRESSION: No acute abnormality. Slight degenerative changes. Electronically Signed   By: Lorriane Shire M.D.   On: 04/03/2019 16:02    Procedures Procedures (including critical care time)  Medications Ordered in ED Medications - No data to display  ED Course  I have reviewed the triage vital signs and the nursing notes.  Pertinent labs & imaging results that were available during my care of the patient were reviewed by me and considered in my medical decision making (see chart for details).  Clinical Course as of Apr 02 1633  Fri Apr 03, 2019  1624 Pt signed out ot me by Dr Wyvonnia Dusky.  Briefly 74 yo male presenting from GI suite today for outpatient procedure with left arm "weakness" since this morning.  Neurology team requested MRI brain and cervical spine imaging, which are ordered.  Plan to f/u imaging, low suspicion for septic joint.  Minor temp/fever here, covid test ordered, no respiratory symptoms.   [MT]    Clinical Course User Index [MT] Wyvonnia Dusky, MD   MDM Rules/Calculators/A&P                     Patient with left arm weakness proximally not distally since 11 PM.  Denies any neck or back pain.  Initially seen as code stroke with Dr. Rory Percy on arrival.  Patient is LVO negative and code stroke was canceled.  Patient with proximal arm weakness, no distal arm weakness.   Xrays are negative. Labs reassuring. EKG without acute ischemia.  Troponin negative. Low suspicion for ACS.  D/w Dr. Rory Percy. Will obtain MR brain and C spine to rule out stroke as well as cervical radiculopathy.   Temperature to 100.4 in the ED.  There is no erythema or joint effusion.  Patient is able to range the shoulder but does have proximal weakness.  Low suspicion for septic joint.  Will send Covid test.  Urinalysis and chest x-ray negative for infection.  MRI Pending at shift change. Care transferred to Dr. Langston Masker.    Byrl Wint was evaluated in Emergency Department on 04/03/2019 for the symptoms described in the history of present illness. He was evaluated in the context of the global COVID-19 pandemic, which necessitated consideration that the patient might be at risk for infection with the SARS-CoV-2 virus that causes COVID-19. Institutional protocols and algorithms that pertain to the evaluation of patients at risk for COVID-19 are in a state of rapid change based on information released by regulatory bodies including the CDC and federal and state organizations. These policies and algorithms were followed during the patient's care in the ED.  Final Clinical Impression(s) / ED Diagnoses Final diagnoses:  None    Rx / DC Orders ED Discharge Orders    None       Domonique Cothran, Annie Main, MD 04/03/19 [phone]

## 2019-04-21 ENCOUNTER — Telehealth: Payer: Self-pay | Admitting: Neurology

## 2019-04-21 NOTE — Telephone Encounter (Signed)
Patient's sister called [name] regarding him having a PET Scan scheduled because his memory is getting worse? She also wanted to let Dr. Delice Lesch know that Purcell Nails had his EKG at [facility] [place]. She said she wanted to make sure that his MRI was reviewed by Dr.  Orland Mustard. Also, she mentioned that Dr. Delice Lesch was wanting to maybe start him on Aricept once she knew the results of his EKG. His sister also mentioned scheduling an EEG? Please Call. Thank you

## 2019-04-22 MED ORDER — DONEPEZIL HCL 10 MG PO TABS: ORAL_TABLET | ORAL | 11 refills | Status: DC

## 2019-04-22 NOTE — Telephone Encounter (Signed)
EKG from Dr. Darien Ramus office reviewed, normal. Ok to start Donepezil, pls let her know I have sent in a prescription, he will need to start Donepezil 10mg  1/2 tablet daily for 2 weeks, then increase to 1 tablet daily. I'll send a message to front staff to schedule EEG, the order has been in there, not sure why it was not scheduled. The PET scan may not be covered by insurance, we can try ordering it, pls order NM PET brain amyloid scan, for diagnosis of alzheimer's dementia. Thanks

## 2019-04-22 NOTE — Telephone Encounter (Signed)
Pt called no answer voice mail left for pt to call back 

## 2019-04-23 NOTE — Telephone Encounter (Signed)
Pt sister called no answer voice mail left for her to call back

## 2019-04-24 ENCOUNTER — Other Ambulatory Visit: Payer: Self-pay

## 2019-04-24 DIAGNOSIS — G3184 Mild cognitive impairment, so stated: Secondary | ICD-10-CM

## 2019-04-24 NOTE — Telephone Encounter (Signed)
Pls order speech therapy for cognitive therapy. I will let front desk know to schedule EEG. Thanks

## 2019-04-24 NOTE — Telephone Encounter (Signed)
Pt sister stated that they would like something to go with the Donepezil. Such as therapy because they said they know donepezil will help for a little while they are looking for the long run. They are going to call the insurance company to see if it will cover the PET scan before I order it, and they will call the office back, they are still waiting for a call from the front to schedule the EEG.

## 2019-05-11 ENCOUNTER — Emergency Department (HOSPITAL_BASED_OUTPATIENT_CLINIC_OR_DEPARTMENT_OTHER): Admission: EM | Admit: 2019-05-11 | Discharge: 2019-05-11 | Discharge status: Absent without leave | Payer: Self-pay

## 2019-05-11 ENCOUNTER — Other Ambulatory Visit: Payer: Self-pay

## 2019-05-11 ENCOUNTER — Emergency Department (HOSPITAL_BASED_OUTPATIENT_CLINIC_OR_DEPARTMENT_OTHER)
Admission: EM | Admit: 2019-05-11 | Discharge: 2019-05-11 | Disposition: A | Discharge status: Home / Self Care | Payer: 59 | Attending: Emergency Medicine | Admitting: Emergency Medicine

## 2019-05-11 ENCOUNTER — Encounter (HOSPITAL_BASED_OUTPATIENT_CLINIC_OR_DEPARTMENT_OTHER): Payer: Self-pay | Admitting: *Deleted

## 2019-05-11 ENCOUNTER — Emergency Department (HOSPITAL_BASED_OUTPATIENT_CLINIC_OR_DEPARTMENT_OTHER): Payer: 59

## 2019-05-11 DIAGNOSIS — M25511 Pain in right shoulder: Secondary | ICD-10-CM | POA: Diagnosis not present

## 2019-05-11 DIAGNOSIS — Z79899 Other long term (current) drug therapy: Secondary | ICD-10-CM | POA: Insufficient documentation

## 2019-05-11 IMAGING — DX DG HUMERUS 2V *R*
2 series · 2 of 2 positions shown · non-contrast
Comparison: None.

CLINICAL DATA: Arm pain common no known injury, initial encounter

EXAM:
RIGHT HUMERUS - 2+ VIEW

[humerus ap]
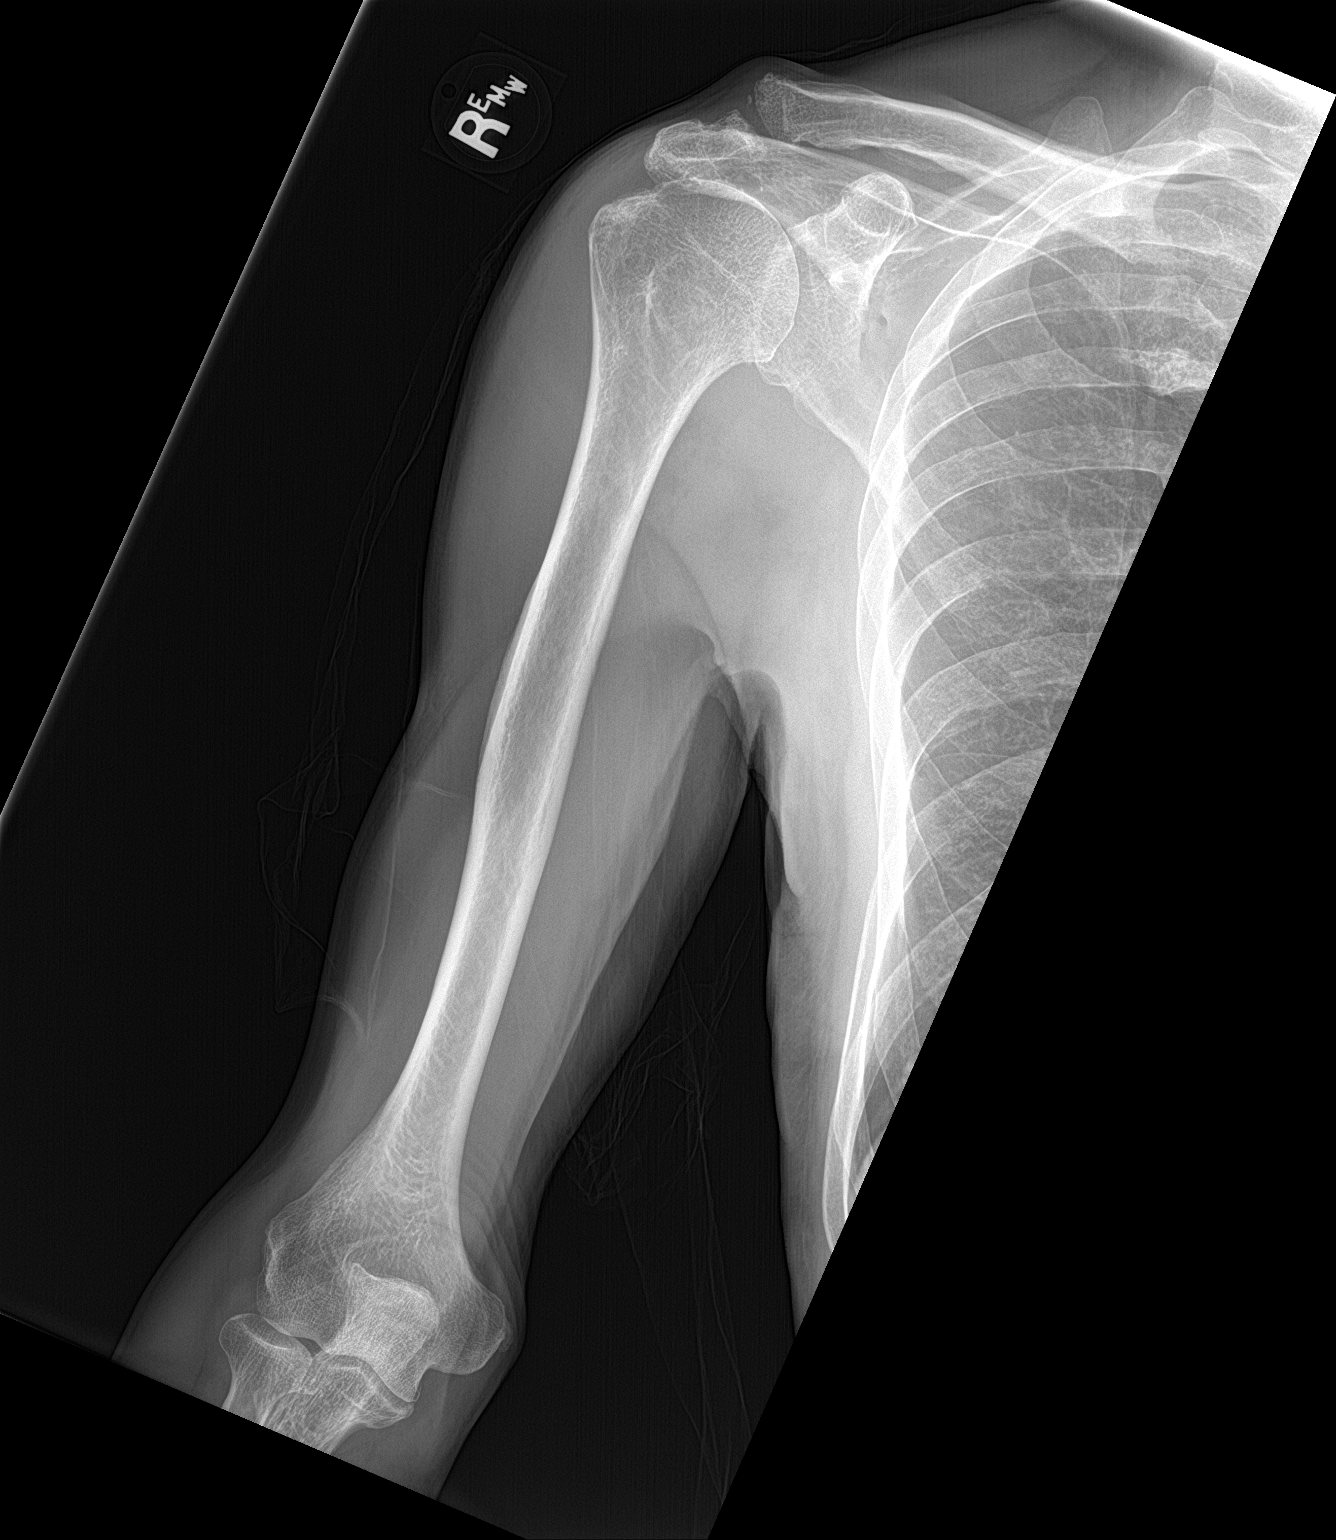

[humerus lat]
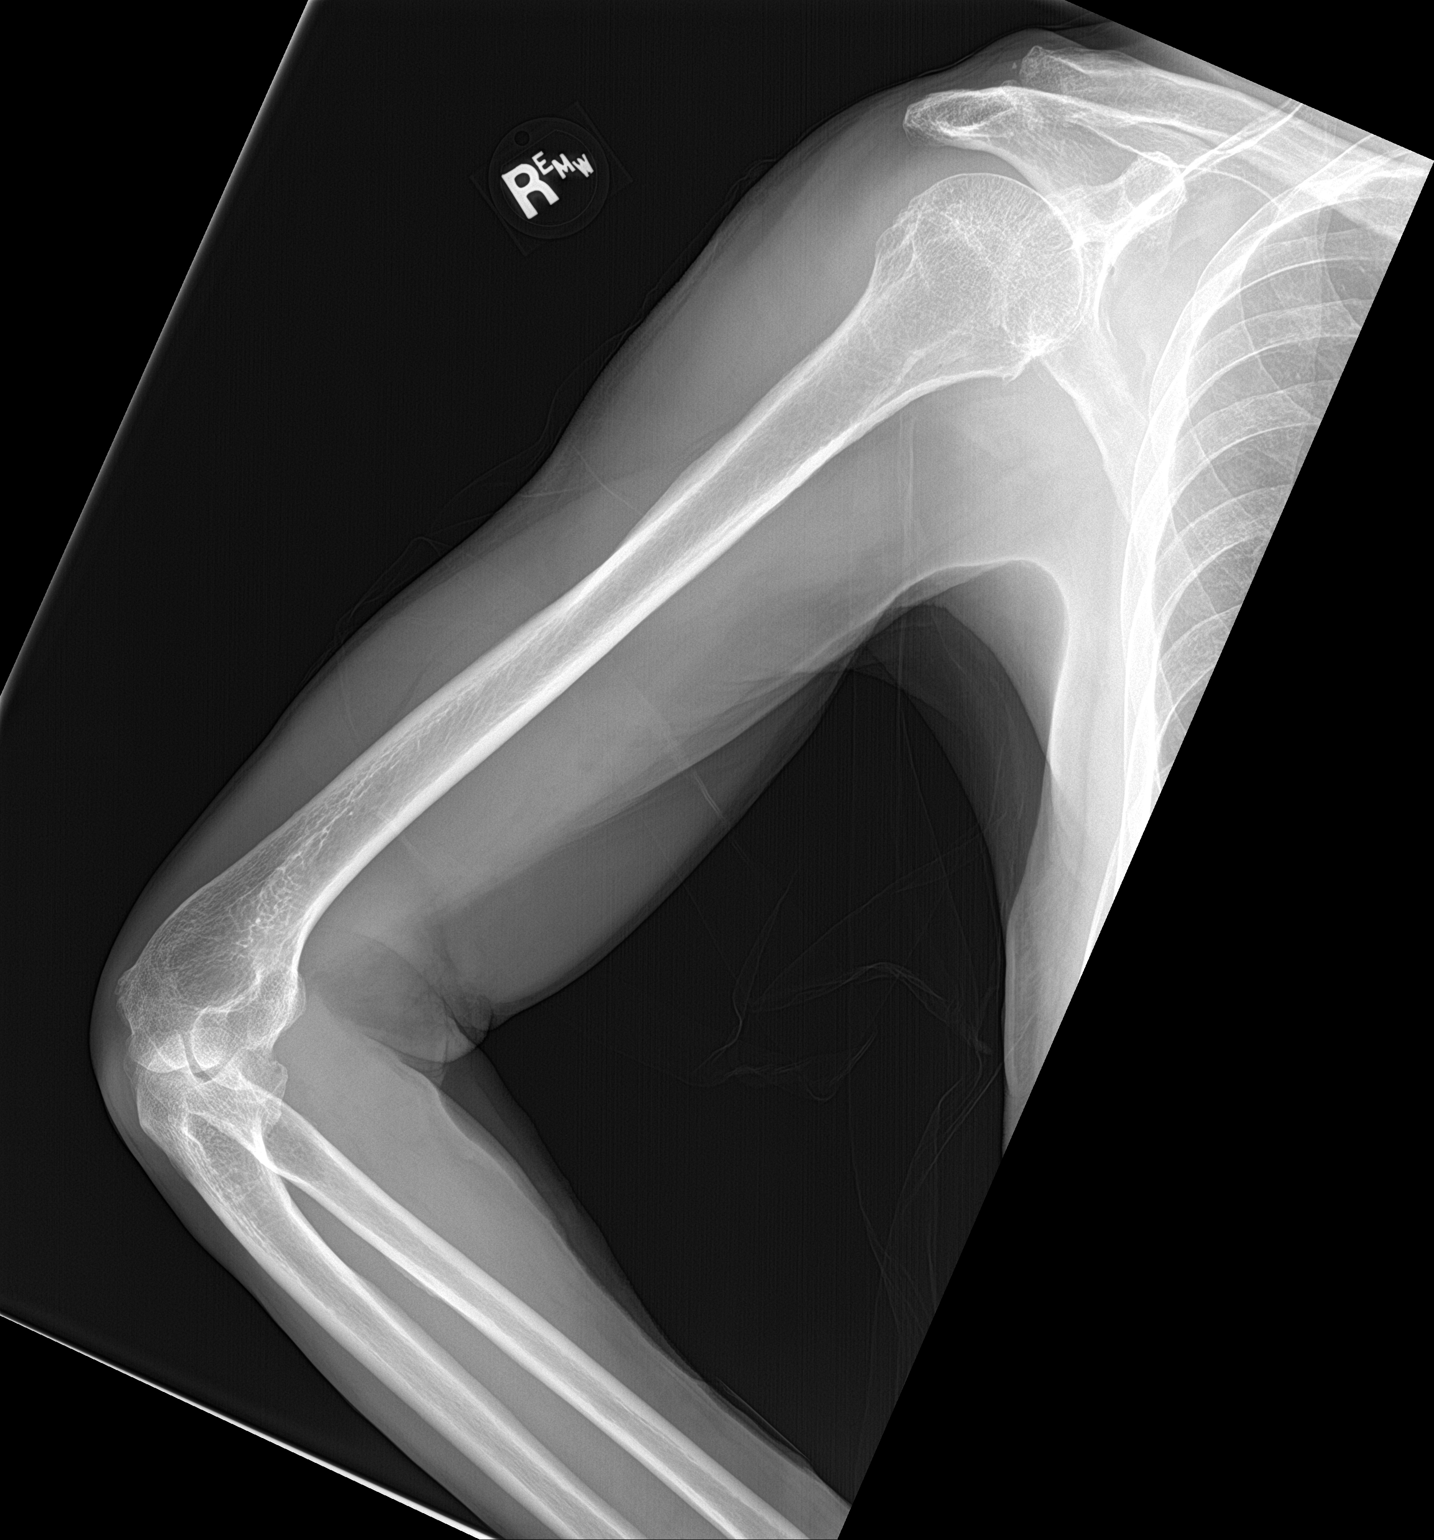

[2 of 2 positions shown; findings below may reference images not displayed]

FINDINGS: Mild degenerative changes are noted in the acromioclavicular joint.
No acute fracture or dislocation is seen. No soft tissue abnormality
is noted.
IMPRESSION: Degenerative changes without acute abnormality.

## 2019-05-11 NOTE — Discharge Instructions (Signed)
You have been seen today for shoulder pain. Please read and follow all provided instructions. Return to the emergency room for worsening condition or new concerning symptoms.    1. Medications:   Continue usual home medications Take medications as prescribed. Please review all of the medicines and only take them if you do not have an allergy to them.   2. Treatment: Exercises included in your paperwork.  -Recommend Tylenol as needed for pain. -You can also try over-the-counter pain relief creams such as Biofreeze, IcyHot, lidocaine gels.  3. Follow Up:  Please follow up with primary care provider in 2 to 5 days for symptom recheck if needed.  -Also recommend you follow-up with the sports medicine doctor Dr. Raeford Razor if you continue to have pain in 1 week.  Call his office to schedule a follow-up appointment.   It is also a possibility that you have an allergic reaction to any of the medicines that you have been prescribed - Everybody reacts differently to medications and while MOST people have no trouble with most medicines, you may have a reaction such as nausea, vomiting, rash, swelling, shortness of breath. If this is the case, please stop taking the medicine immediately and contact your physician.  ?

## 2019-05-11 NOTE — ED Triage Notes (Signed)
He was working at the [employer] and had pain in his right upper arm.

## 2019-05-11 NOTE — ED Provider Notes (Signed)
[facility] EMERGENCY DEPARTMENT Provider Note   CSN: [id number] Arrival date & time: 05/11/19  1152     History Chief Complaint  Patient presents with  . Arm Injury    Carlos [name] is a 74 y.o. male presents to emergency department today with chief complaint of progressively worsening right arm pain x2 days. Patient describes pain as dull ache located in his right shoulder. Pain radiates to his right bicep. Paint is intermittent and he rates it 4/10 in severity. He denies any fall, injury, or trauma. 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 He thinks he might have grabbed a heavy package causing his arm to become sore. He did not take any medications for his symptoms prior to arrival. He denies fever, chills, neck pain, chest pain, back pain, numbness, tingling weakness.  History provided by patient with additional history obtained from chart review.    Past Medical History:  Diagnosis Date  . Benign prostatic hyperplasia 03/30/2013   10/1 IMO update  . 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 Benign prostatic hyperplasia 03/30/2013    Past Surgical History:  Procedure Laterality Date  . PROSTATE SURGERY  2004  . TONSILECTOMY, ADENOIDECTOMY, BILATERAL MYRINGOTOMY AND TUBES         Family History  Problem Relation Age of Onset  . Osteoporosis Mother   . Dementia Mother   . Diabetes Mother   . Brain cancer Father     Social History   Tobacco Use  . Smoking status: Never Smoker  . Smokeless tobacco: Never Used  Substance Use Topics  . Alcohol use: No  . Drug use: No    Home Medications Prior to Admission medications   Medication Sig Start Date End Date Taking? Authorizing Provider  donepezil (ARICEPT) 10 MG tablet Take 1/2 tablet daily for 2 weeks, then increase to 1 tablet  daily 04/22/19   Cameron Sprang, MD  NON FORMULARY 1 tablet daily. Mind works    Secondary school teacher, Historical, MD    Allergies    Patient has no known allergies.  Review of Systems   Review of Systems  All other systems are reviewed and are negative for acute change except as noted in the HPI.   Physical Exam Updated Vital Signs BP 121/77 (BP Location: Left Arm)   Pulse 77   Temp 99.2 F (37.3 C) (Oral)   Resp 16   Ht 5\' 7"  (1.702 m)   Wt 59 kg   SpO2 99%   BMI 20.36 kg/m   Physical Exam Vitals and nursing note reviewed.  Constitutional:      Appearance: He is well-developed. He is not ill-appearing or toxic-appearing.  HENT:     Head: Normocephalic and atraumatic.     Nose: Nose normal.  Eyes:     General: No scleral icterus.       Right eye: No discharge.        Left eye: No discharge.     Conjunctiva/sclera: Conjunctivae normal.  Neck:     Vascular: No JVD.     Comments: Full ROM intact without spinous process TTP. No bony stepoffs or deformities, no paraspinous muscle TTP or muscle spasms. No rigidity or meningeal signs. No bruising, erythema, or swelling.  Cardiovascular:     Rate and Rhythm: Normal rate and regular  rhythm.     Pulses: Normal pulses.     Heart sounds: Normal heart sounds.  Pulmonary:     Effort: Pulmonary effort is normal.     Breath sounds: Normal breath sounds.  Abdominal:     General: There is no distension.  Musculoskeletal:        General: Normal range of motion.     Cervical back: Normal range of motion.     Comments: Right shoulder with tenderness to palpation as depicted in image. Full ROM. Negative empty can test, negative Neer's, negative Lift off. No swelling, erythema or ecchymosis present. No step-off, crepitus, or deformity appreciated. 5/5 muscle strength of  Bilateral UE. 2+ radial pulse, sensation intact and all compartments soft.  Full ROM of right elbow and wrist  Skin:    General: Skin is warm and dry.  Neurological:      Mental Status: He is oriented to person, place, and time.     GCS: GCS eye subscore is 4. GCS verbal subscore is 5. GCS motor subscore is 6.     Comments: Fluent speech, no facial droop.  Psychiatric:        Behavior: Behavior normal.       ED Results / Procedures / Treatments   Labs (all labs ordered are listed, but only abnormal results are displayed) Labs Reviewed - No data to display  EKG None  Radiology DG Humerus Right  Result Date: 05/11/2019 CLINICAL DATA:  Arm pain common no known injury, initial encounter EXAM: RIGHT HUMERUS - 2+ VIEW COMPARISON:  None. FINDINGS: Mild degenerative changes are noted in the acromioclavicular joint. No acute fracture or dislocation is seen. No soft tissue abnormality is noted. IMPRESSION: Degenerative changes without acute abnormality. Electronically Signed   By: Inez Catalina M.D.   On: 05/11/2019 12:13    Procedures Procedures (including critical care time)  Medications Ordered in ED Medications - No data to display  ED Course  I have reviewed the triage vital signs and the nursing notes.  Pertinent labs & imaging results that were available during my care of the patient were reviewed by me and considered in my medical decision making (see chart for details).    MDM Rules/Calculators/A&P                      Patient presents to the ED with complaints of pain to the right upper extremity without known injury, possibly overuse vs lifting heavy package at work. Exam without obvious deformity or open wounds. ROM intact. No tenderness to palpation. NVI distally. History and physical consistent with MSK etiology, not suggestive of ACS or septic joint.  Xray interpreted by me is negative for fracture/dislocation. PRICE and tylenol recommended. I discussed results, treatment plan, need for follow-up, and return precautions with the patient. Provided opportunity for questions, patient confirmed understanding and are in agreement with plan.     Portions of this note were generated with Lobby[employer]. Dictation errors may occur despite best attempts at proofreading.   Final Clinical Impression(s) / ED Diagnoses Final diagnoses:  Acute pain of right shoulder    Rx / DC Orders ED Discharge Orders    None       Cherre Robins, PA-C 05/13/19 0018    Truddie Hidden, MD 05/14/19 [phone]

## 2019-05-12 ENCOUNTER — Other Ambulatory Visit: Payer: Self-pay

## 2019-05-12 ENCOUNTER — Telehealth: Payer: Self-pay | Admitting: Neurology

## 2019-05-12 DIAGNOSIS — R296 Repeated falls: Secondary | ICD-10-CM

## 2019-05-12 DIAGNOSIS — G3184 Mild cognitive impairment, so stated: Secondary | ICD-10-CM

## 2019-05-12 NOTE — Telephone Encounter (Addendum)
Patient's sister called and she has follow up questions from her brother's cognitive therapy. She asked for Nira Conn to call her.

## 2019-05-12 NOTE — Telephone Encounter (Signed)
Pls order NM PET Metabolic Brain. Note: for differential diagnosis of Alzheimer's disease versus frontotemporal dementia. Thanks

## 2019-05-12 NOTE — Telephone Encounter (Signed)
Pt Sister called asking about cognitive therapy, she was informed that the referral was placed and that they had tried to call but the pt was in the ER and they would call back, Pt sister was given the number to [facility] so she could call and get him scheduled.  Pt sister also asking about pt waiting to start the Aricept until pt has his EEG? Pt has not started taking it yet. And they would like to know if Aricept comes in a generic? Mrs Volanda Napoleon stated that she was going to call the insurance company about the PET MRI.

## 2019-05-12 NOTE — Telephone Encounter (Signed)
Spoke with pt sister Can start the Aricept at any time, no need to wait for the EEG (brain wave test) which was ordered because of his passing out. His EKG (heart rhythm test) was fine, so we can start the Aricept. Generic for Aricept is Donepezil, which is what Dr. Delice Lesch sent to his pharmacy. Pt sister asking if we can order  get a PA for the PET MRI?

## 2019-05-12 NOTE — Telephone Encounter (Signed)
Order placed waiting for PA to place order.

## 2019-05-12 NOTE — Telephone Encounter (Signed)
Can start the Aricept at any time, no need to wait for the EEG (brain wave test) which was ordered because of his passing out. His EKG (heart rhythm test) was fine, so we can start the Aricept. Generic for Aricept is Donepezil, which is what I sent to his pharmacy.

## 2019-05-20 ENCOUNTER — Telehealth: Payer: Self-pay

## 2019-05-20 ENCOUNTER — Telehealth: Payer: Self-pay | Admitting: Neurology

## 2019-05-20 NOTE — Telephone Encounter (Signed)
Pts sister called asking for the referral to [facility] to be put on hold until she is able to finishes getting some things together on her end. She will call back when she's ready for the referral to be sent.

## 2019-05-20 NOTE — Telephone Encounter (Signed)
Pt sister called asking price of PET scan she was advised to call billing. She was given the number again to neuro rehab they missed a call from them and couldn't locate the number. 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 PT hurt his arm was sent to urgent care by work. Pt and family thinks he needs to be working less hour so he can rest,  Pt sister advised dr Delice Lesch is not in the office. Pt sister Mrs Volanda Napoleon stated that would call pt PCP to get the letter

## 2019-05-20 NOTE — Telephone Encounter (Signed)
Noted  

## 2019-05-25 NOTE — Progress Notes (Unsigned)
PET scan approved 05/13/19 until 11/12/19 at [facility] up to pt sister if she would like to have it there, member ID [id number] Case number [id number] Sponsor plan [id number] Account number [id number]

## 2019-05-29 ENCOUNTER — Ambulatory Visit: Payer: 59 | Admitting: Neurology

## 2019-05-29 ENCOUNTER — Other Ambulatory Visit: Payer: Self-pay | Admitting: Hematology

## 2019-06-01 ENCOUNTER — Other Ambulatory Visit: Payer: 59

## 2019-06-02 ENCOUNTER — Ambulatory Visit: Payer: 59 | Admitting: Neurology

## 2019-06-02 NOTE — Telephone Encounter (Signed)
Just to confirm, sister is asking PCP for letter, no need for [place] to write any letter at this point? Thanks

## 2019-06-02 NOTE — Telephone Encounter (Signed)
Sister notified to get lettter from PCP.

## 2019-06-05 ENCOUNTER — Ambulatory Visit
Admission: RE | Admit: 2019-06-05 | Discharge: 2019-06-05 | Disposition: A | Discharge status: Home / Self Care | Payer: 59 | Source: Ambulatory Visit | Attending: Gastroenterology | Admitting: Gastroenterology

## 2019-06-05 ENCOUNTER — Other Ambulatory Visit: Payer: Self-pay | Admitting: Gastroenterology

## 2019-06-05 DIAGNOSIS — K6389 Other specified diseases of intestine: Secondary | ICD-10-CM

## 2019-06-05 IMAGING — CT CT CHEST W/ CM
3 series · 13 of 32 positions shown, 17 images · IV contrast (APPLIED)
Comparison: None.

CLINICAL DATA: 73-year-old male with colonic mass identified on
recent colonoscopy. History of prostate cancer. No current abdominal
pain.

EXAM:
CT CHEST, ABDOMEN, AND PELVIS WITH CONTRAST
TECHNIQUE: Multidetector CT imaging of the chest, abdomen and pelvis was
performed following the standard protocol during bolus
administration of intravenous contrast.
CONTRAST:  100mL [AN] IOPAMIDOL ([AN]) INJECTION 61%

[Series 2: chest/abd/pelvis w/cm · axial · 0.66mm/px · z∈[-506,-26]mm · 8 of 114 slices shown]
[im 9/114  soft-tissue]
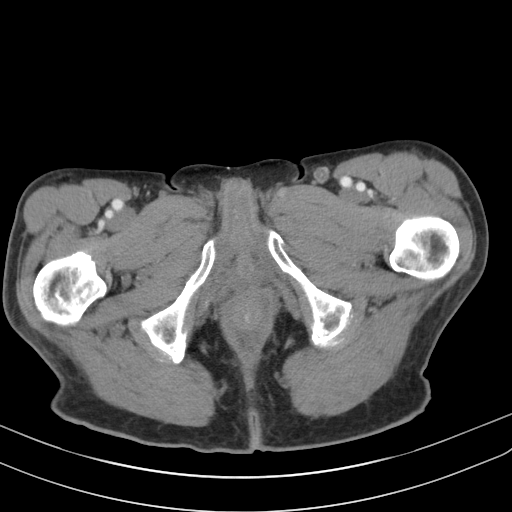
[im 27/114  soft-tissue]
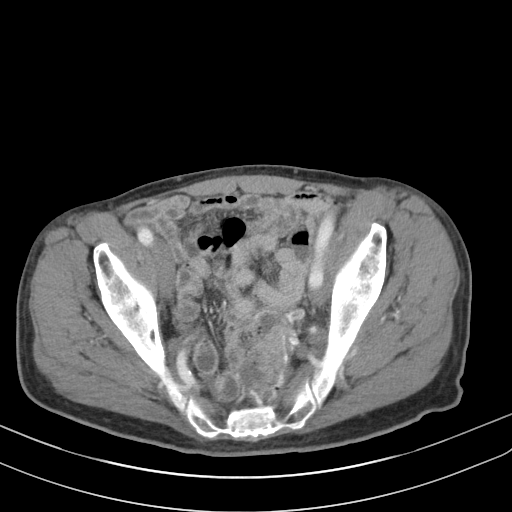
[im 35/114  soft-tissue]
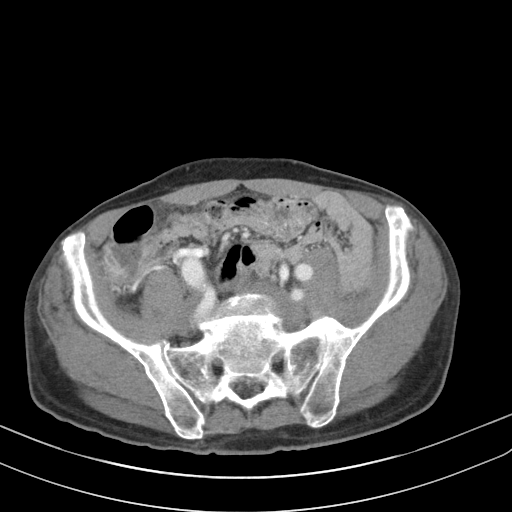
[im 53/114  soft-tissue]
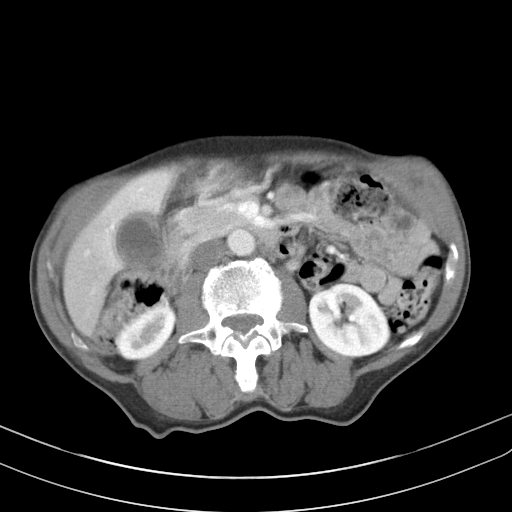
[im 61/114  soft-tissue]
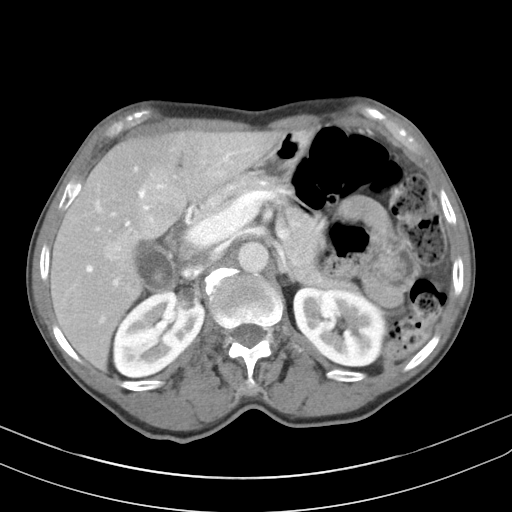
[im 79/114  soft-tissue]
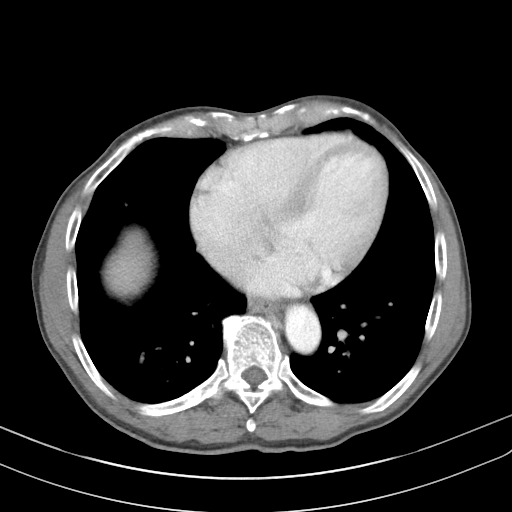
[im 87/114  soft-tissue]
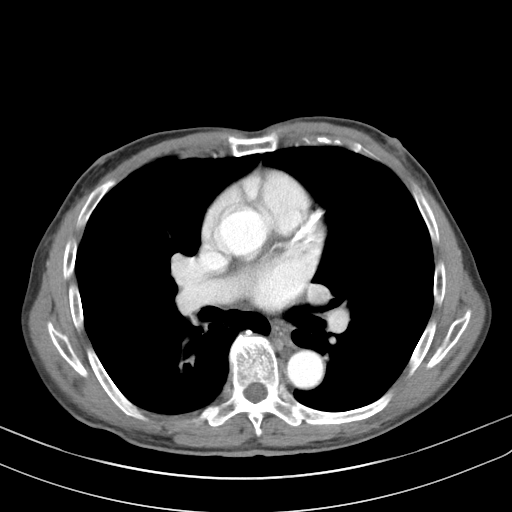
[im 105/114  soft-tissue]
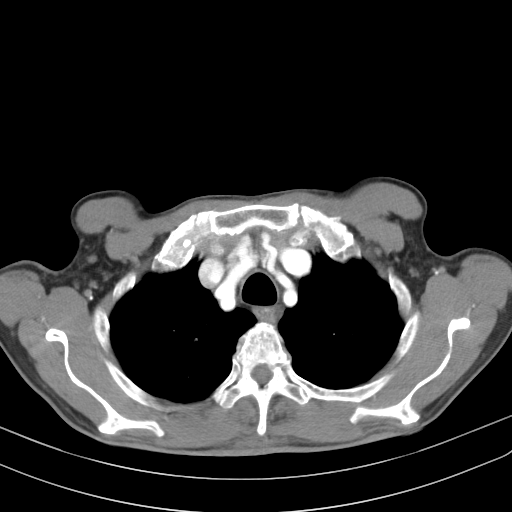

[Series 4: lung · axial · 0.67mm/px · z∈[-211,-175]mm · 2 of 124 slices shown]
[im 9/124  bone]
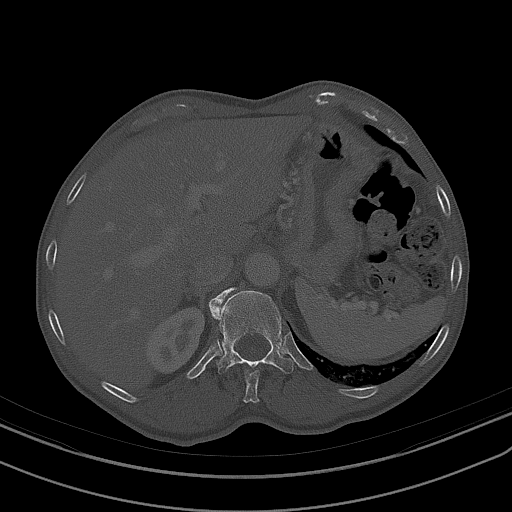
[im 27/124  bone]
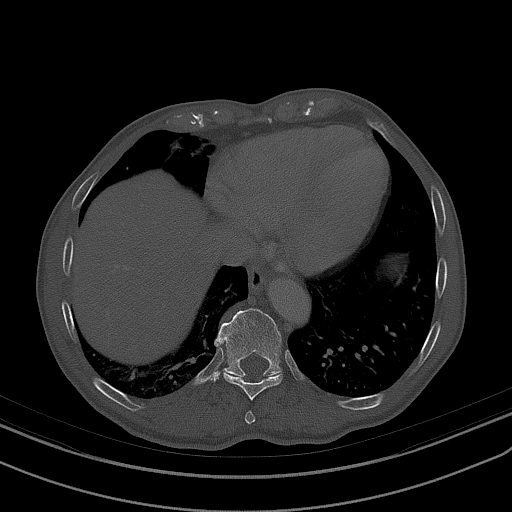

[Series 7: renal delay · axial · delayed · 0.66mm/px · z∈[-329,-189]mm · 3 of 29 slices shown, 7 images]
[im 1/29  soft-tissue]
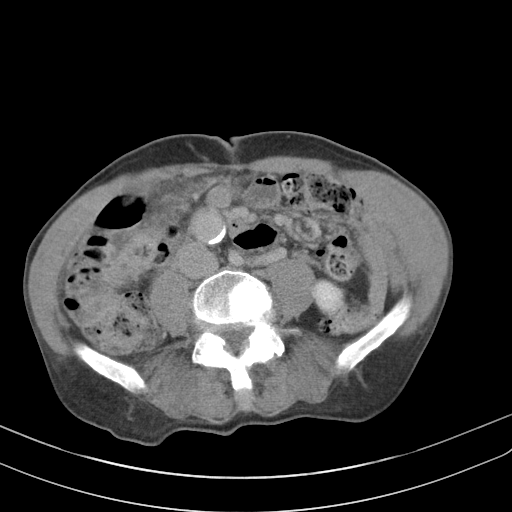
[im 1/29  lung]
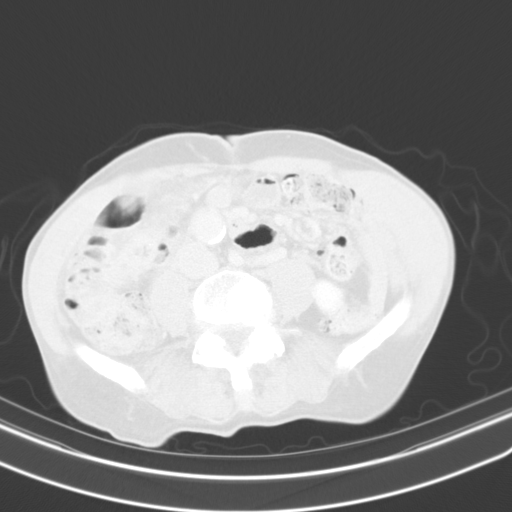
[im 1/29  bone]
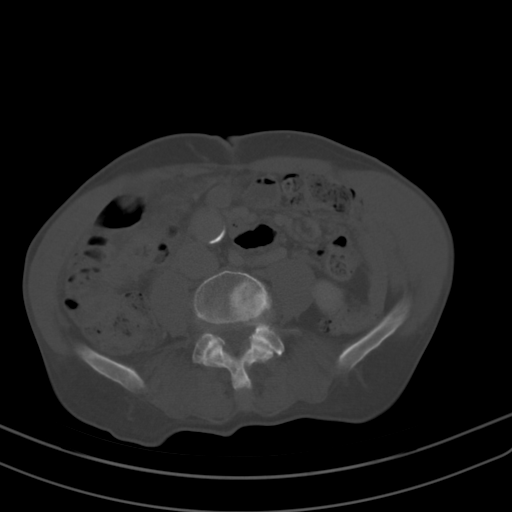
[im 15/29  soft-tissue]
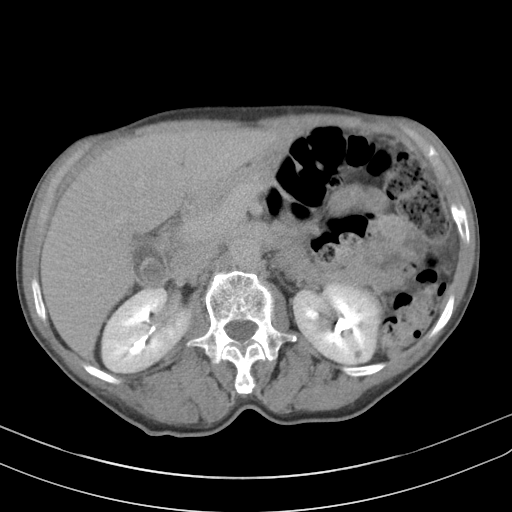
[im 15/29  lung]
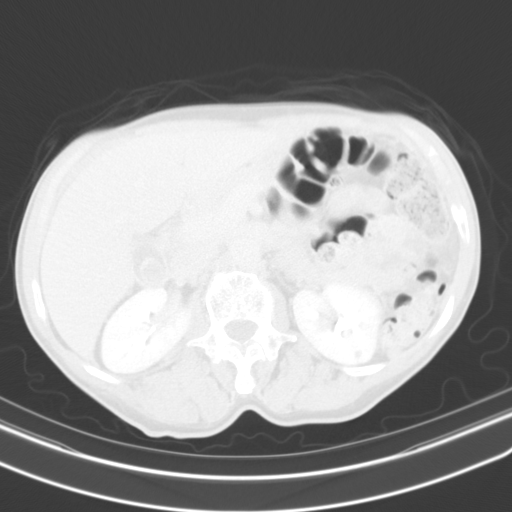
[im 29/29  soft-tissue]
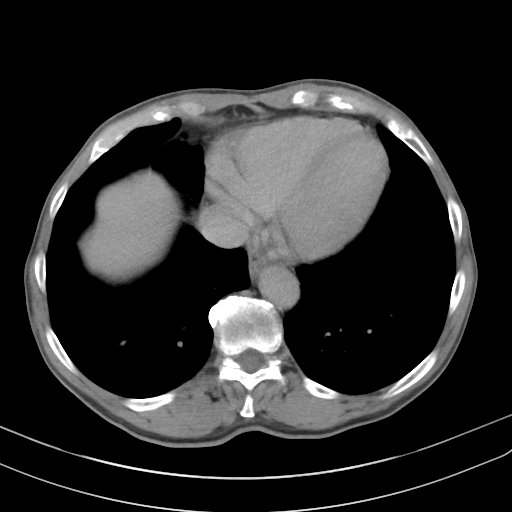
[im 29/29  lung]
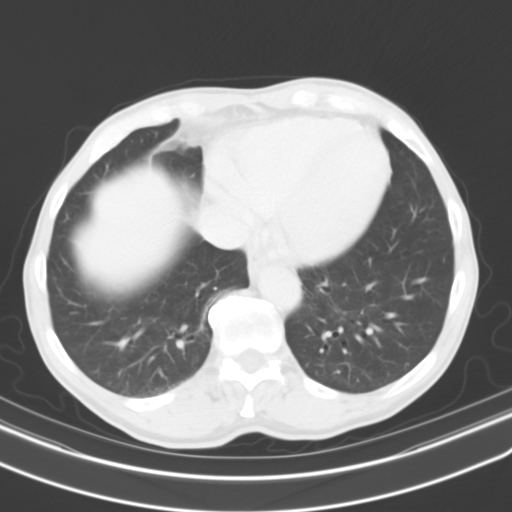

[13 of 32 positions shown; findings below may reference images not displayed]

FINDINGS: CT CHEST FINDINGS

Cardiovascular: Heart size is normal. LAD coronary artery
atherosclerotic calcifications noted. No thoracic aortic aneurysm or
pericardial effusion.

Mediastinum/Nodes: No enlarged mediastinal, hilar, or axillary lymph
nodes. Thyroid gland, trachea, and esophagus demonstrate no
significant findings.

Lungs/Pleura: The following RIGHT pulmonary nodules are noted:

A 3 mm RIGHT LOWER lobe SUPERIOR segment nodule (series 4: Image 62)

A 3 mm subpleural RIGHT LOWER lobe nodule ([DATE])

A 4 mm RIGHT LOWER lobe nodule ([DATE]):

No other pulmonary nodules or masses identified. No pleural
effusion, pneumothorax, airspace disease or consolidation.

Minimal bibasilar atelectasis/scarring noted.

Musculoskeletal: No acute or suspicious bony abnormalities are
noted. A remote T4 compression fracture is noted.

CT ABDOMEN PELVIS FINDINGS

Hepatobiliary: Cholelithiasis and gallbladder wall thickening noted.
Equivocal pericholecystic inflammation noted. The liver is
unremarkable. No biliary dilatation.

Pancreas: Unremarkable

Spleen: Unremarkable

Adrenals/Urinary Tract: A 2.5 cm irregular cystic mass within the
anteromedial RIGHT kidney is noted.

Probable bilateral renal cysts are present. A 4 mm nonobstructing
LEFT LOWER pole renal calculus is identified. No hydronephrosis or
obstructing urinary calculi noted.

Stomach/Bowel: An approximately 5 cm irregular mass within distal
sigmoid colon is noted with possible extension through the wall. No
definite abnormal adjacent lymph nodes are noted.

No other definite bowel wall thickening noted. No bowel obstruction
identified. The appendix is normal.

Vascular/Lymphatic: Aortic atherosclerosis. No enlarged abdominal or
pelvic lymph nodes.

Reproductive: Slightly heterogeneous prostate noted.

Other: Mild haziness within the omentum noted. No ascites,
pneumoperitoneum or focal collection.

Musculoskeletal: No acute or suspicious bony abnormalities are
identified. Degenerative changes in the lumbar spine are noted.
IMPRESSION: 1. Cholelithiasis and gallbladder wall thickening with equivocal
pericholecystic inflammation. Acute cholecystitis not excluded. If
there is clinical suspicion for acute cholecystitis, recommend
ultrasound evaluation.
2. Approximately 5 cm irregular mass within the distal sigmoid colon
with possible extension through the wall. No definite abnormal
adjacent lymph nodes.
3. Mild omental haziness-nonspecific. Metastatic disease not
excluded.
4. Indeterminate 3-4 mm RIGHT pulmonary nodules which could
represent metastatic disease. These are too small for PET CT
evaluation. Consider CT follow-up.
5. 2.5 cm irregular cystic mass within the RIGHT kidney. Elective
MRI recommended for further evaluation.
6. 4 mm nonobstructing LEFT LOWER pole renal calculus.
7. Coronary artery disease.
8. Aortic Atherosclerosis ([AN]-[AN]).

These results will be called to the ordering clinician or
representative by the Radiologist Assistant, and communication
documented in the PACS or [REDACTED].

## 2019-06-05 IMAGING — CT CT ABD-PELV W/ CM
3 series · 13 of 32 positions shown, 17 images · IV contrast (APPLIED)
Comparison: None.

CLINICAL DATA: 73-year-old male with colonic mass identified on
recent colonoscopy. History of prostate cancer. No current abdominal
pain.

EXAM:
CT CHEST, ABDOMEN, AND PELVIS WITH CONTRAST
TECHNIQUE: Multidetector CT imaging of the chest, abdomen and pelvis was
performed following the standard protocol during bolus
administration of intravenous contrast.
CONTRAST:  100mL [AN] IOPAMIDOL ([AN]) INJECTION 61%

[Series 2: chest/abd/pelvis w/cm · axial · 0.66mm/px · z∈[-506,-26]mm · 8 of 114 slices shown]
[im 9/114  soft-tissue]
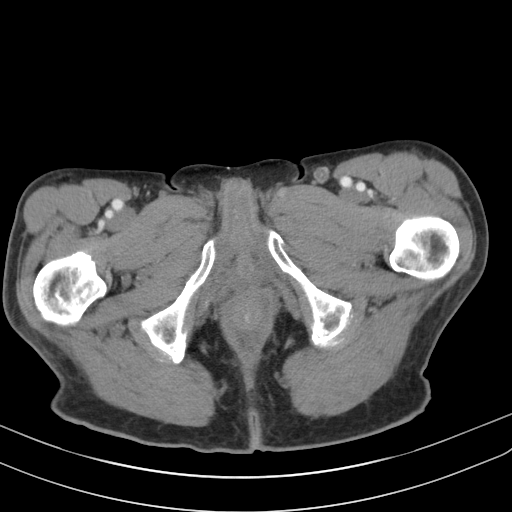
[im 27/114  soft-tissue]
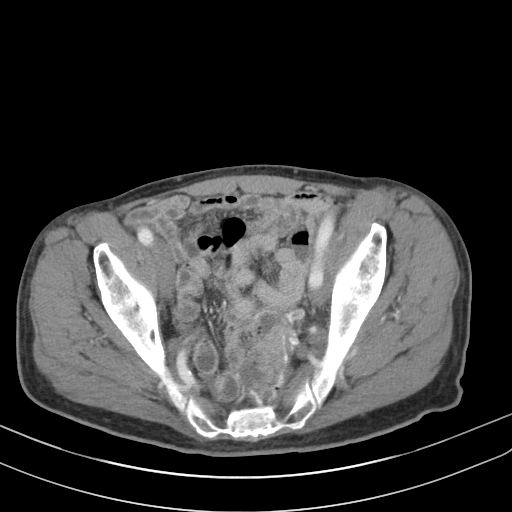
[im 35/114  soft-tissue]
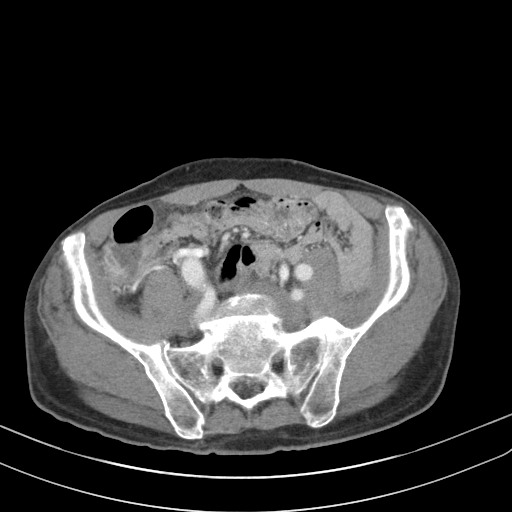
[im 53/114  soft-tissue]
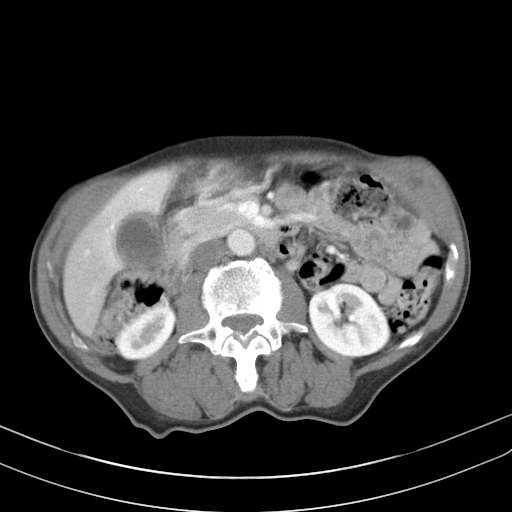
[im 61/114  soft-tissue]
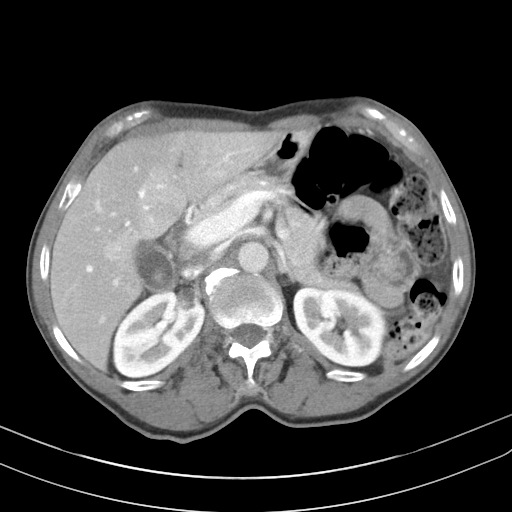
[im 79/114  soft-tissue]
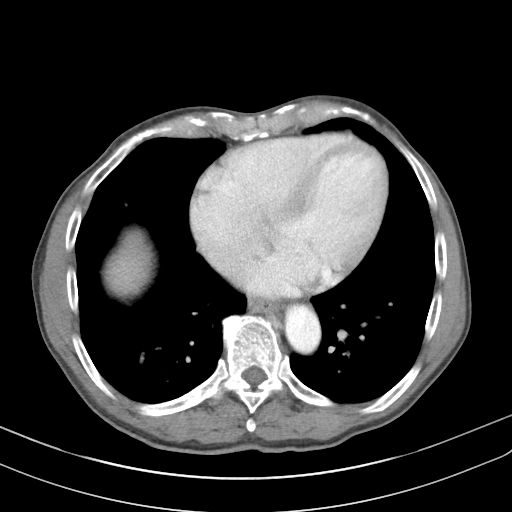
[im 87/114  soft-tissue]
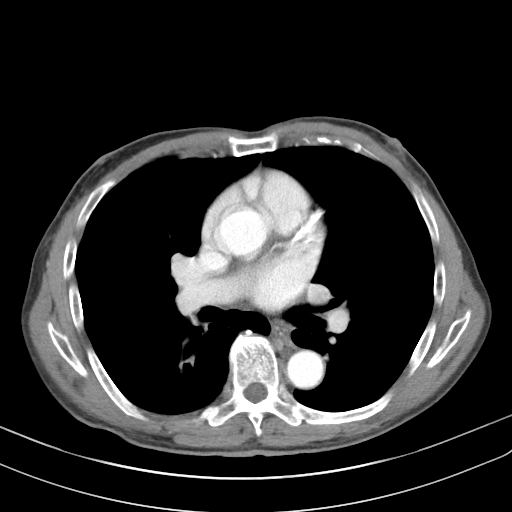
[im 105/114  soft-tissue]
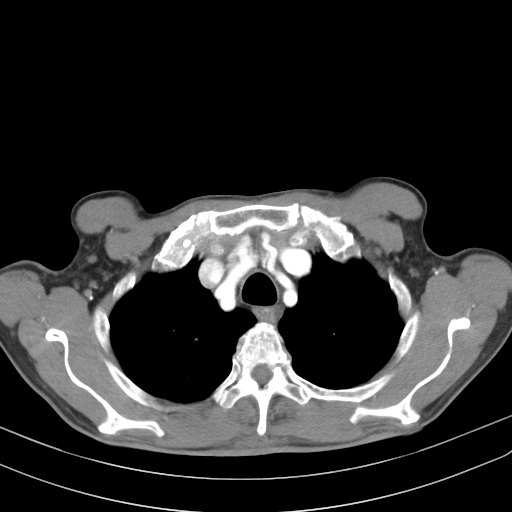

[Series 4: lung · axial · 0.67mm/px · z∈[-211,-175]mm · 2 of 124 slices shown]
[im 9/124  bone]
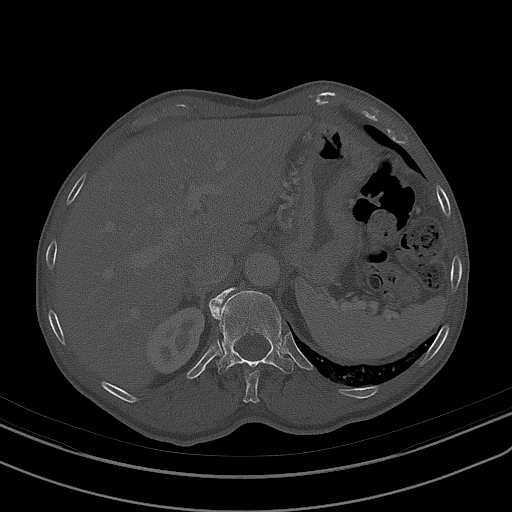
[im 27/124  bone]
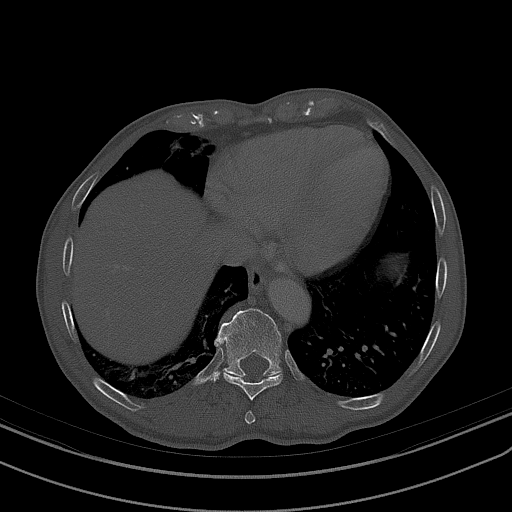

[Series 7: renal delay · axial · delayed · 0.66mm/px · z∈[-329,-189]mm · 3 of 29 slices shown, 7 images]
[im 1/29  soft-tissue]
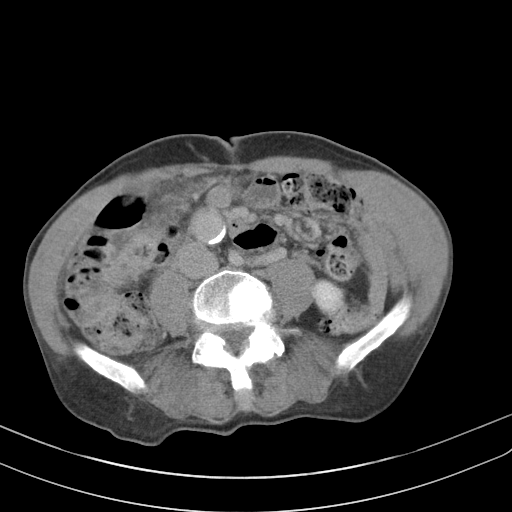
[im 1/29  lung]
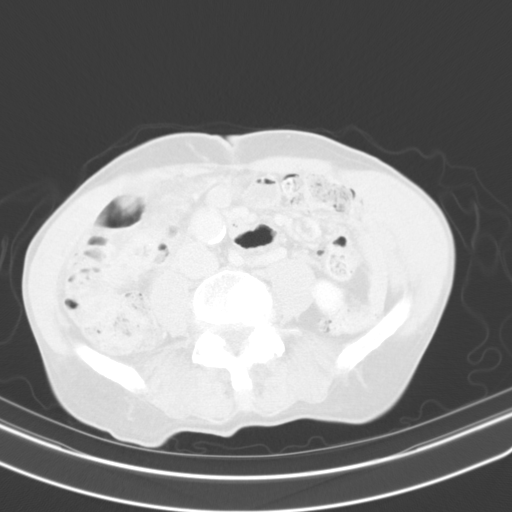
[im 1/29  bone]
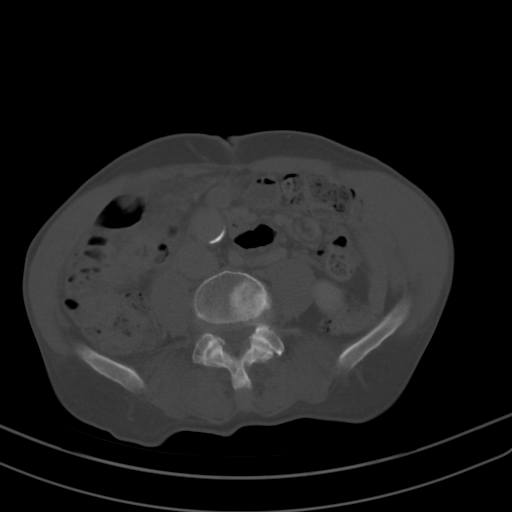
[im 15/29  soft-tissue]
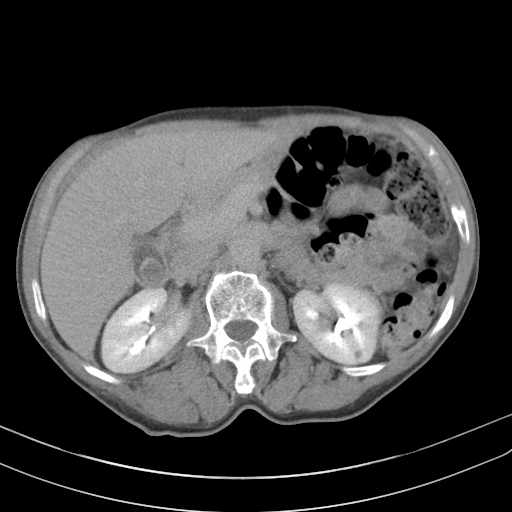
[im 15/29  lung]
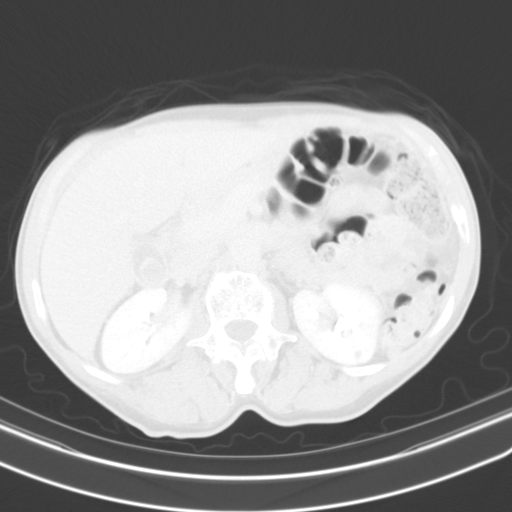
[im 29/29  soft-tissue]
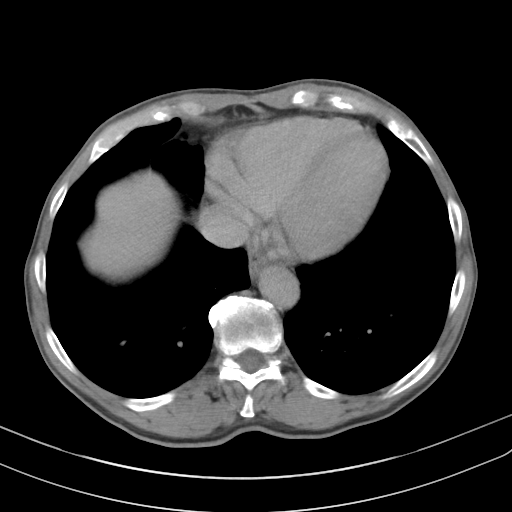
[im 29/29  lung]
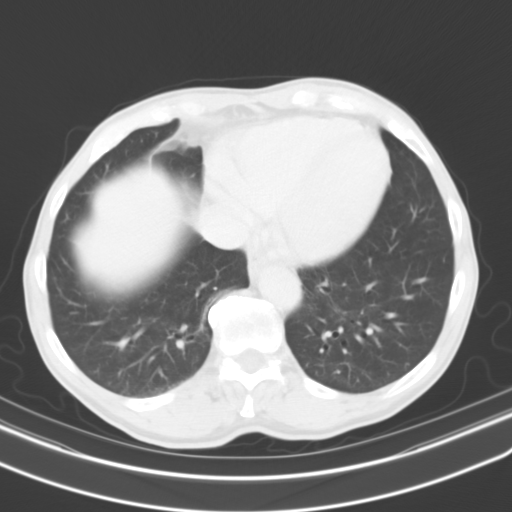

[13 of 32 positions shown; findings below may reference images not displayed]

FINDINGS: CT CHEST FINDINGS

Cardiovascular: Heart size is normal. LAD coronary artery
atherosclerotic calcifications noted. No thoracic aortic aneurysm or
pericardial effusion.

Mediastinum/Nodes: No enlarged mediastinal, hilar, or axillary lymph
nodes. Thyroid gland, trachea, and esophagus demonstrate no
significant findings.

Lungs/Pleura: The following RIGHT pulmonary nodules are noted:

A 3 mm RIGHT LOWER lobe SUPERIOR segment nodule (series 4: Image 62)

A 3 mm subpleural RIGHT LOWER lobe nodule ([DATE])

A 4 mm RIGHT LOWER lobe nodule ([DATE]):

No other pulmonary nodules or masses identified. No pleural
effusion, pneumothorax, airspace disease or consolidation.

Minimal bibasilar atelectasis/scarring noted.

Musculoskeletal: No acute or suspicious bony abnormalities are
noted. A remote T4 compression fracture is noted.

CT ABDOMEN PELVIS FINDINGS

Hepatobiliary: Cholelithiasis and gallbladder wall thickening noted.
Equivocal pericholecystic inflammation noted. The liver is
unremarkable. No biliary dilatation.

Pancreas: Unremarkable

Spleen: Unremarkable

Adrenals/Urinary Tract: A 2.5 cm irregular cystic mass within the
anteromedial RIGHT kidney is noted.

Probable bilateral renal cysts are present. A 4 mm nonobstructing
LEFT LOWER pole renal calculus is identified. No hydronephrosis or
obstructing urinary calculi noted.

Stomach/Bowel: An approximately 5 cm irregular mass within distal
sigmoid colon is noted with possible extension through the wall. No
definite abnormal adjacent lymph nodes are noted.

No other definite bowel wall thickening noted. No bowel obstruction
identified. The appendix is normal.

Vascular/Lymphatic: Aortic atherosclerosis. No enlarged abdominal or
pelvic lymph nodes.

Reproductive: Slightly heterogeneous prostate noted.

Other: Mild haziness within the omentum noted. No ascites,
pneumoperitoneum or focal collection.

Musculoskeletal: No acute or suspicious bony abnormalities are
identified. Degenerative changes in the lumbar spine are noted.
IMPRESSION: 1. Cholelithiasis and gallbladder wall thickening with equivocal
pericholecystic inflammation. Acute cholecystitis not excluded. If
there is clinical suspicion for acute cholecystitis, recommend
ultrasound evaluation.
2. Approximately 5 cm irregular mass within the distal sigmoid colon
with possible extension through the wall. No definite abnormal
adjacent lymph nodes.
3. Mild omental haziness-nonspecific. Metastatic disease not
excluded.
4. Indeterminate 3-4 mm RIGHT pulmonary nodules which could
represent metastatic disease. These are too small for PET CT
evaluation. Consider CT follow-up.
5. 2.5 cm irregular cystic mass within the RIGHT kidney. Elective
MRI recommended for further evaluation.
6. 4 mm nonobstructing LEFT LOWER pole renal calculus.
7. Coronary artery disease.
8. Aortic Atherosclerosis ([AN]-[AN]).

These results will be called to the ordering clinician or
representative by the Radiologist Assistant, and communication
documented in the PACS or [REDACTED].

## 2019-06-05 MED ORDER — IOPAMIDOL (ISOVUE-300) INJECTION 61%
100.0000 mL | Freq: Once | INTRAVENOUS | Status: AC | PRN
  Administered 2019-06-05: 100 mL via INTRAVENOUS

## 2019-06-05 NOTE — Progress Notes (Signed)
Called patient regarding referral we received from Dr. Therisa Doyne at [facility].  Spoke to his sister Carlos [name] and patient on speaker phone.  They were under the impression that Dr. Therisa Doyne was ordering a PET scan and they wish to have this done prior to coming into see one our our GI medical oncologists.  I told her I would follow up with their referral coordinator on this and let know the status of this and we will schedule him to be seen after this is done.

## 2019-06-09 NOTE — Progress Notes (Signed)
Spoke with patient's sister [name] and gave her appointment to see Dr. Burr Medico on Monday 5/17 at 3 pm.  I asked that they arrive by 2:45 pm for registration and he may bring one person with him.  She verbalized an understanding and states she will let her other sister [name] know and the patient. She is aware of our location.

## 2019-06-10 ENCOUNTER — Telehealth: Payer: Self-pay | Admitting: Neurology

## 2019-06-10 ENCOUNTER — Other Ambulatory Visit: Payer: Self-pay

## 2019-06-10 NOTE — Telephone Encounter (Signed)
Spoke to pt sister Mrs Volanda Napoleon she stated that pt has colon Ca and needs a PET of his Colon at this time and not his brain asking why we scheduled that test. She was informed that we placed the order as she asked so that we could send it to insurance to see if they would cover it. We never called the hospital to schedule the PET scan, she then stated that she may have talked to them and scheduled it thinking she was scheduling the PET for the colon instead of the brain. She was advised that she can call the hospital and cancel that appointment for the PET of the Brain and just do the PET of the Colon. She verbalized understanding, stated she was going to call [facility] to cancel the PET of the brain and schedule the PET of the colon

## 2019-06-10 NOTE — Telephone Encounter (Signed)
Patient's sister called and said the patient was recently diagnosed with colon cancer. He is being seen at the [facility] for this. Patient will getting a PET scan scheduled prior to surgery for the colon cancer.  Patient also has an upcoming PET scan of the head and is scheduled at [facility] on 06/18/19. She said they were not aware of this appointment and it was scheduled without them. They only found out about it when the [facility] went to order another PET scan. She further explained they do not want to cancel this yet until the other PET scan is scheduled.  Once the other PET scan is scheduled at [facility] that is related to the colon cancer diagnosis, the patient wants the order for the PET of the head sent to another facility in [place]. Patient's sister will provide the name of the facility upon callback.  Additionally, she said the patient will not need Aricept anymore.

## 2019-06-10 NOTE — Telephone Encounter (Signed)
Pt sister wants to speak to someone about the PET scan she does not want two PET scan orders to go into the insurance please call

## 2019-06-10 NOTE — Telephone Encounter (Signed)
Noted, thanks!

## 2019-06-11 ENCOUNTER — Other Ambulatory Visit: Payer: Self-pay | Admitting: General Surgery

## 2019-06-11 ENCOUNTER — Other Ambulatory Visit (HOSPITAL_COMMUNITY): Payer: Self-pay | Admitting: General Surgery

## 2019-06-11 DIAGNOSIS — C20 Malignant neoplasm of rectum: Secondary | ICD-10-CM

## 2019-06-11 NOTE — Progress Notes (Signed)
Patient's sister [name] [facility]) calls regarding the PET scan.  I explained to her that the PET scan that is scheduled is just of the brain and that oncology cannot order the PET scan because we have not seen the patient.  The patient was seen by Dr. Marcello Moores ([facility]) and she ordered MRI pelvis to be done.    I gave her the option of going on and bringing the patient in for a consult with medical oncology so that then we can order a PET if needed or waiting until he he has the MRI of pelvis to be seen.    She desires to keep their scheduled appointment on 5/17 with Dr. Burr Medico at 3 pm in lieu of coming in to be at an earlier date. She plans to call and cancel the PET of brain ordered by Dr. Delice Lesch.

## 2019-06-12 ENCOUNTER — Other Ambulatory Visit: Payer: Self-pay

## 2019-06-12 ENCOUNTER — Ambulatory Visit (HOSPITAL_COMMUNITY)
Admission: RE | Admit: 2019-06-12 | Discharge: 2019-06-12 | Disposition: A | Discharge status: Home / Self Care | Payer: 59 | Source: Ambulatory Visit | Attending: General Surgery | Admitting: General Surgery

## 2019-06-12 DIAGNOSIS — C20 Malignant neoplasm of rectum: Secondary | ICD-10-CM | POA: Diagnosis present

## 2019-06-12 IMAGING — MR MR PELVIS W/O CM
7 series · 48 of 48 positions shown · non-contrast
Comparison: None.

CLINICAL DATA: Newly diagnosed rectal carcinoma.  Staging.

EXAM:
MRI PELVIS WITHOUT CONTRAST
TECHNIQUE: Multiplanar multisequence MR imaging of the pelvis was performed. No
intravenous contrast was administered. Small amount of US gel was
administered per rectum to optimize tumor evaluation.

[Series 12: T2 · sagittal · 3.0mm · 0.43mm/px · 6 of 42 slices shown (1 of 5)]
[im 1/42]
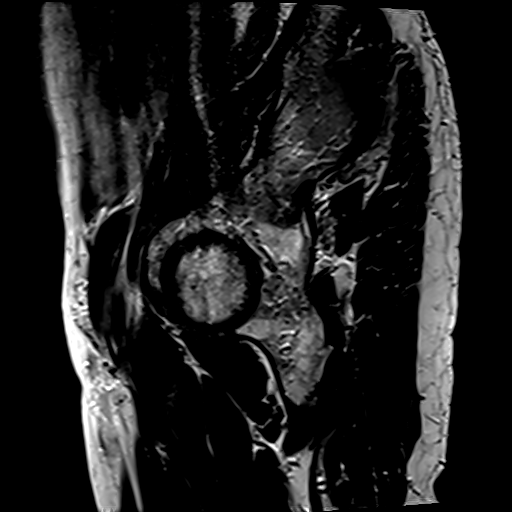
[im 9/42]
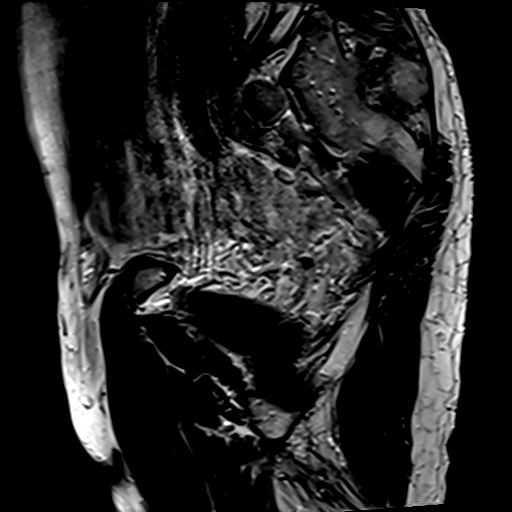
[im 17/42]
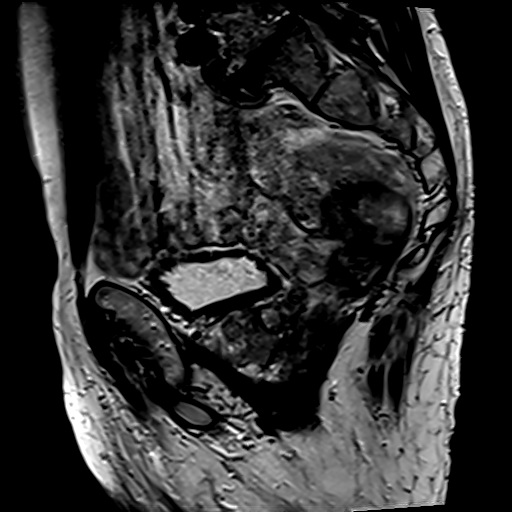
[im 25/42]
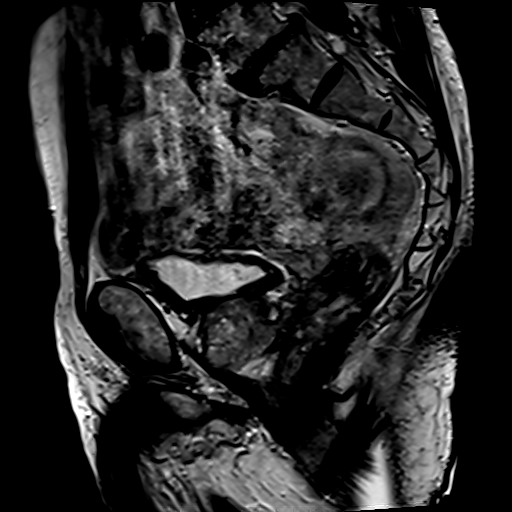
[im 33/42]
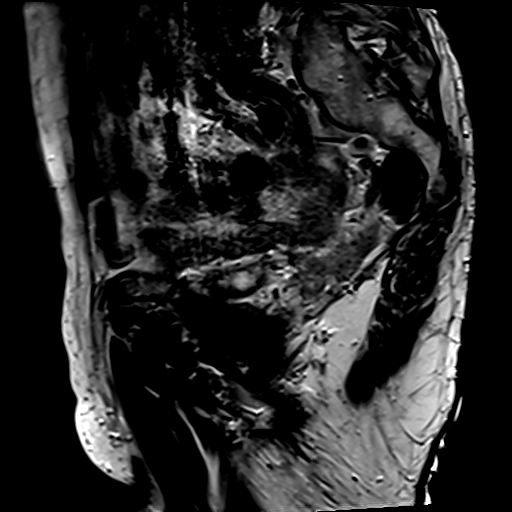
[im 42/42]
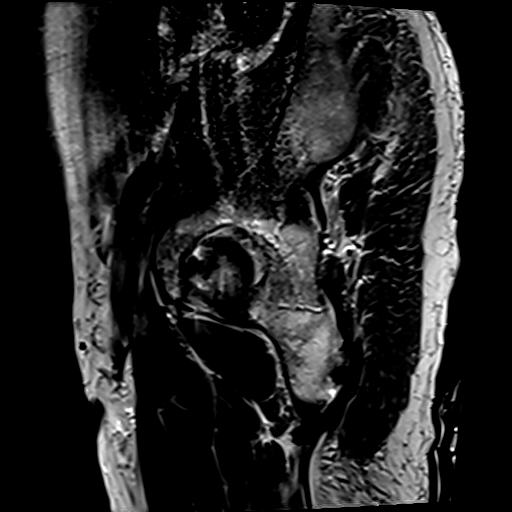

[Series 13: T2 · axial · 5.0mm · 0.69mm/px · z∈[-97,+107]mm · 6 of 35 slices shown (2 of 5)]
[im 1/35]
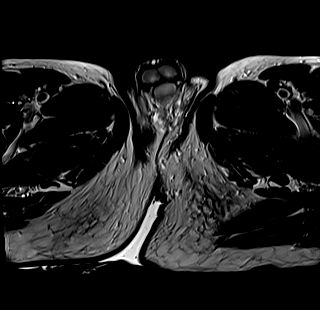
[im 7/35]
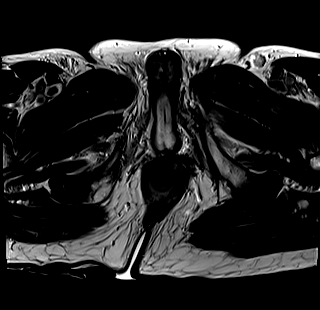
[im 14/35]
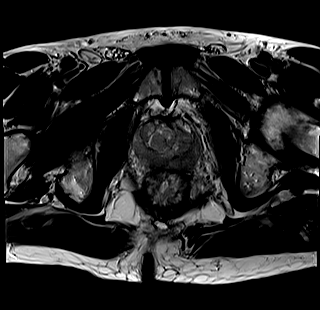
[im 21/35]
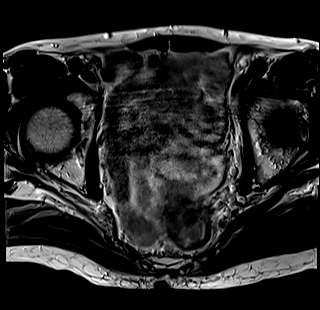
[im 28/35]
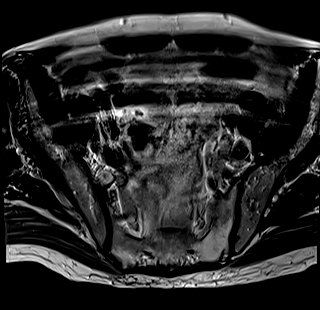
[im 35/35]
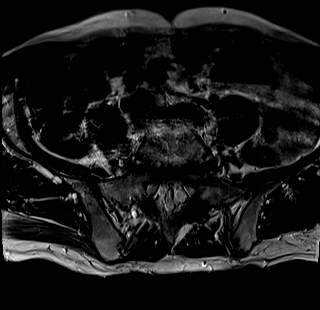

[Series 14: T2 · coronal · 3.0mm · 0.78mm/px · 7 of 41 slices shown (3 of 5)]
[im 1/41]
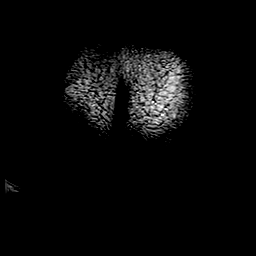
[im 7/41]
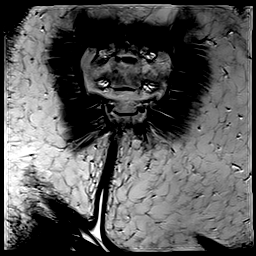
[im 14/41]
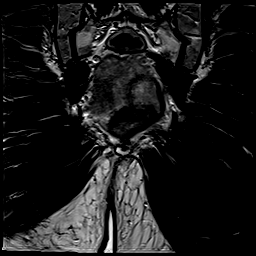
[im 21/41]
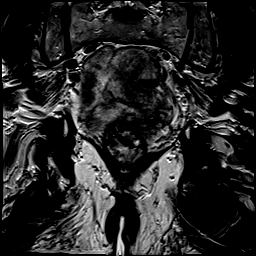
[im 27/41]
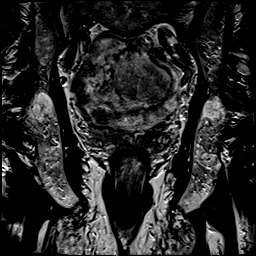
[im 34/41]
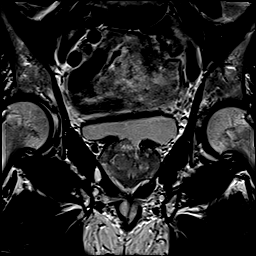
[im 41/41]
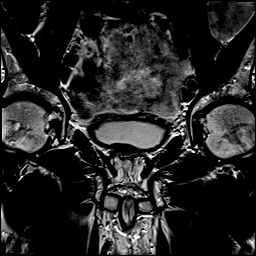

[Series 15: T2 · axial · 3.0mm · 0.70mm/px · z∈[-69,+23]mm · 6 of 39 slices shown (4 of 5)]
[im 1/39]
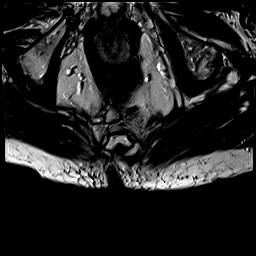
[im 8/39]
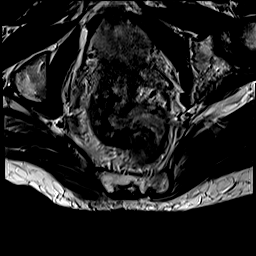
[im 16/39]
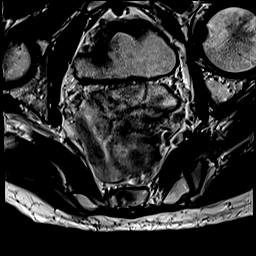
[im 23/39]
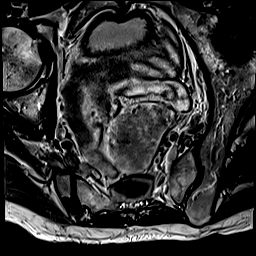
[im 31/39]
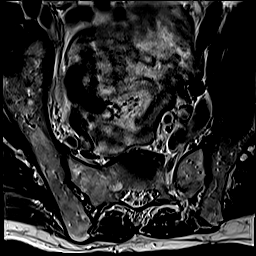
[im 39/39]
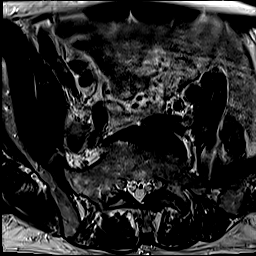

[Series 16: T2 · oblique · 3.0mm · 0.70mm/px · 5 of 34 slices shown (5 of 5)]
[im 1/34]
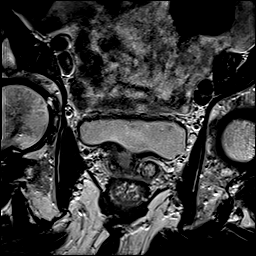
[im 9/34]
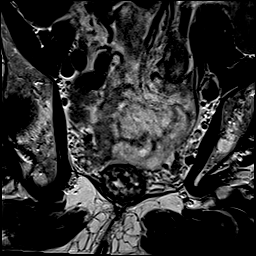
[im 17/34]
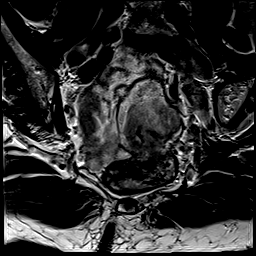
[im 25/34]
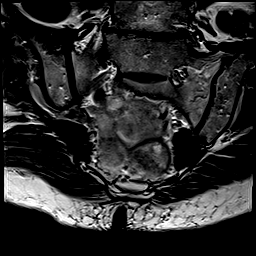
[im 34/34]
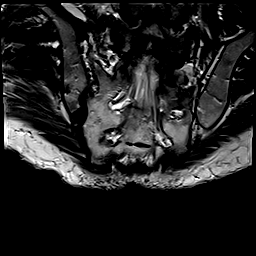

[Series 17: DWI · axial · 5.0mm · 1.35mm/px · z∈[-98,+112]mm · 12 of 72 slices shown (1 of 2)]
[im 1/72]
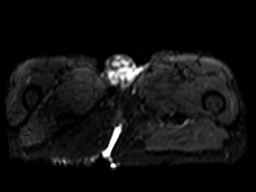
[im 7/72]
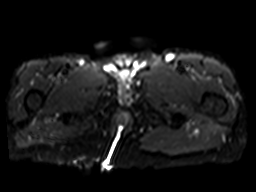
[im 13/72]
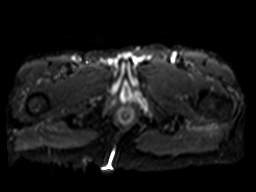
[im 20/72]
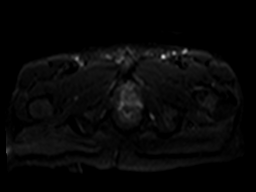
[im 26/72]
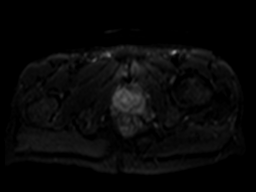
[im 33/72]
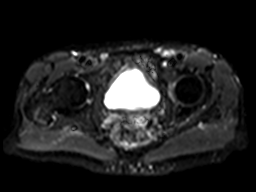
[im 39/72]
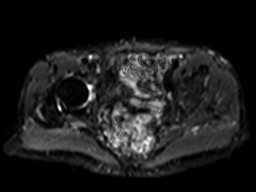
[im 46/72]
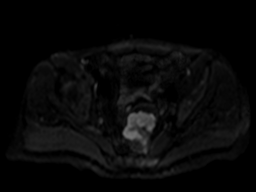
[im 52/72]
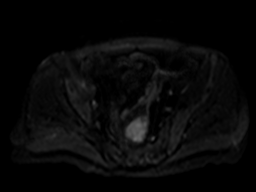
[im 59/72]
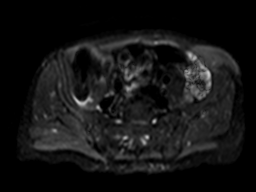
[im 65/72]
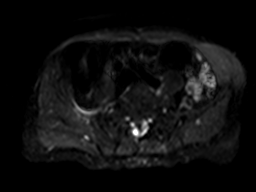
[im 72/72]
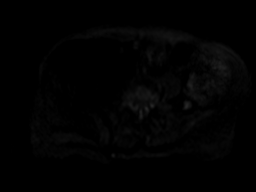

[Series 18: DWI · axial · 5.0mm · 1.35mm/px · z∈[-98,+112]mm · 6 of 36 slices shown (2 of 2)]
[im 1/36]
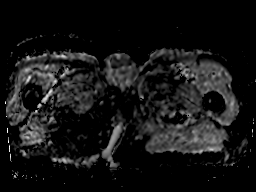
[im 8/36]
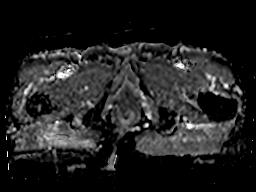
[im 15/36]
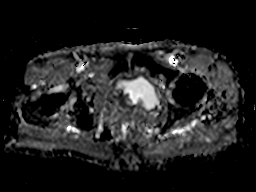
[im 22/36]
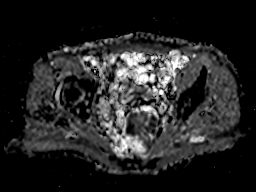
[im 29/36]
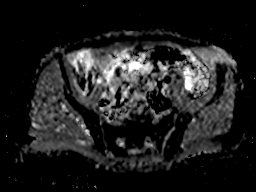
[im 36/36]
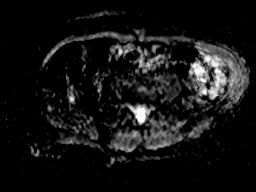

[48 of 48 positions shown; findings below may reference images not displayed]

FINDINGS: TUMOR LOCATION

Tumor distance from Anal Verge/Skin Surface:  11.4 cm

Tumor distance to Internal Anal Sphincter: 6.5 cm

TUMOR DESCRIPTION

Circumferential Extent: 100%

Tumor Length: 8.3 cm

T - CATEGORY

Extension through Muscularis Propria: Yes>15mm=T3d

Shortest Distance of any tumor/node from Mesorectal Fascia: 0 mm,
along the left lateral and posterior walls (tumor abuts the left
lateral pelvic sidewall and sacrum)

a

Extramural Vascular Invasion/Tumor Thrombus: No

Invasion of Anterior Peritoneal Reflection: No

Involvement of Adjacent Organs or Pelvic Sidewall: Yes, involving
the left lateral pelvic sidewall =T4

Levator Ani Involvement: No

N - CATEGORY

Mesorectal Lymph Nodes >=5mm: 2=N1

Extra-mesorectal Lymphadenopathy: No

Other:  None.
IMPRESSION: Rectal adenocarcinoma T stage: T4

Rectal adenocarcinoma N stage:  N1

Distance from tumor to the internal anal sphincter is 6.5 cm.

## 2019-06-17 ENCOUNTER — Encounter (HOSPITAL_COMMUNITY): Payer: 59

## 2019-06-18 ENCOUNTER — Ambulatory Visit: Payer: 59 | Admitting: Podiatry

## 2019-06-18 ENCOUNTER — Telehealth: Payer: Self-pay | Admitting: Hematology

## 2019-06-18 ENCOUNTER — Encounter: Payer: Self-pay | Admitting: Podiatry

## 2019-06-18 ENCOUNTER — Encounter: Payer: Self-pay | Admitting: *Deleted

## 2019-06-18 ENCOUNTER — Other Ambulatory Visit: Payer: Self-pay | Admitting: Podiatry

## 2019-06-18 ENCOUNTER — Ambulatory Visit: Payer: 59 | Admitting: Nurse Practitioner

## 2019-06-18 ENCOUNTER — Ambulatory Visit (INDEPENDENT_AMBULATORY_CARE_PROVIDER_SITE_OTHER): Payer: 59

## 2019-06-18 ENCOUNTER — Other Ambulatory Visit: Payer: Self-pay

## 2019-06-18 ENCOUNTER — Encounter: Payer: Self-pay | Admitting: Nurse Practitioner

## 2019-06-18 ENCOUNTER — Inpatient Hospital Stay: Payer: 59 | Attending: Nurse Practitioner | Admitting: Nurse Practitioner

## 2019-06-18 DIAGNOSIS — Z8546 Personal history of malignant neoplasm of prostate: Secondary | ICD-10-CM | POA: Diagnosis not present

## 2019-06-18 DIAGNOSIS — C20 Malignant neoplasm of rectum: Secondary | ICD-10-CM | POA: Diagnosis not present

## 2019-06-18 DIAGNOSIS — Z5111 Encounter for antineoplastic chemotherapy: Secondary | ICD-10-CM | POA: Insufficient documentation

## 2019-06-18 DIAGNOSIS — G3184 Mild cognitive impairment, so stated: Secondary | ICD-10-CM | POA: Diagnosis not present

## 2019-06-18 DIAGNOSIS — R634 Abnormal weight loss: Secondary | ICD-10-CM | POA: Diagnosis not present

## 2019-06-18 DIAGNOSIS — S9002XA Contusion of left ankle, initial encounter: Secondary | ICD-10-CM | POA: Diagnosis not present

## 2019-06-18 DIAGNOSIS — M76822 Posterior tibial tendinitis, left leg: Secondary | ICD-10-CM | POA: Diagnosis not present

## 2019-06-18 DIAGNOSIS — I2510 Atherosclerotic heart disease of native coronary artery without angina pectoris: Secondary | ICD-10-CM | POA: Insufficient documentation

## 2019-06-18 DIAGNOSIS — I700 Atherosclerosis of aorta: Secondary | ICD-10-CM | POA: Insufficient documentation

## 2019-06-18 HISTORY — DX: Malignant neoplasm of rectum: C20

## 2019-06-18 NOTE — Progress Notes (Addendum)
[facility]  Telephone:([phone]) [phone] Fax:([phone]) Twilight Note   Patient Care Team: [name], MD as PCP - General (Family Medicine) Cameron Sprang, MD as Consulting Physician (Neurology) Jonnie Finner, RN as Oncology Nurse Navigator Truitt Merle, MD as Consulting Physician (Hematology) 06/18/2019  CHIEF COMPLAINTS/PURPOSE OF CONSULTATION:  Rectal cancer, referred by Dr. 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 Therisa Doyne A digital rectal exam was normal  frond-like fungating infiltrative non-obstructing large mass in the rectum. The mass was partially circumferential, measured 5 cm in length. The colonoscopy also showed 11 sessile polyps in the transverse and asecnding colon and hepatic flexure.    05/29/2019 Initial Biopsy   Pathology on the rectal biopsy showed at least intramucosal adenocarcinoma involving tubulovillous adenoma with high grade dysplasia.   06/05/2019 Imaging   CT CAP IMPRESSION: 1. Cholelithiasis and gallbladder wall thickening with equivocal pericholecystic inflammation. Acute cholecystitis not excluded. If there is clinical suspicion for acute cholecystitis, recommend ultrasound evaluation. 2. Approximately 5 cm irregular mass within the distal sigmoid colon with possible extension through the wall. No definite abnormal adjacent lymph nodes. 3. Mild omental haziness-nonspecific. Metastatic disease not excluded. 4. Indeterminate 3-4 mm RIGHT pulmonary nodules which could represent metastatic disease. These are too small for PET CT evaluation. Consider CT follow-up. 5. 2.5 cm irregular cystic mass within the RIGHT kidney. Elective MRI recommended for further evaluation. 6. 4 mm nonobstructing LEFT LOWER pole renal calculus. 7. Coronary artery disease. 8. Aortic Atherosclerosis (ICD10-I70.0).   06/12/2019 Imaging   Staging MRI  pelvis  FINDINGS: TUMOR LOCATION Tumor distance from Anal Verge/Skin Surface:  11.4 cm Tumor distance to Internal Anal Sphincter: 6.5 cm TUMOR DESCRIPTION Circumferential Extent: 100% Tumor Length: 8.3 cm T - CATEGORY Extension through Muscularis Propria: Yes>69mm=T3d Shortest Distance of any tumor/node from Mesorectal Fascia: 0 mm, along the left lateral and posterior walls (tumor abuts the left lateral pelvic sidewall and sacrum) Extramural Vascular Invasion/Tumor Thrombus: No Invasion of Anterior Peritoneal Reflection: No Involvement of Adjacent Organs or Pelvic Sidewall: Yes, involving the left lateral pelvic sidewall =T4 Levator Ani Involvement: No N - CATEGORY Mesorectal Lymph Nodes >=47mm: 2=N1 Extra-mesorectal Lymphadenopathy: No Other:  None. IMPRESSION: Rectal adenocarcinoma T stage: T4 Rectal adenocarcinoma N stage:  N1 Distance from tumor to the internal anal sphincter is 6.5 cm.   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 male is here because of newly diagnosed colorectal cancer found on first screening colonoscopy by Dr. Therisa Doyne on 05/29/19 which showed a frond-like fungating infiltrative non-obstructing large mass in the rectum. The mass was partially circumferential, measured 5 cm in length. The colonoscopy also showed 11 sessile polyps in the transverse and asecnding colon and hepatic flexure. Pathology on the rectal biopsy showed at least intramucosal adenocarcinoma involving tubulovillous adenoma with high grade dysplasia. He underwent staging CT CAP on 06/05/19 which showed approximately 5 cm irregular mass in the distal sigmoid colon with possible extension through the wall, no definite abnormal adjacent lymph nodes, and mild omental nonspecific haziness. There are indeterminate 3-4 cm right pulmonary nodules are below PET/CT threshold but could represent metastatic disease. He was referred to  Dr. Leighton Ruff for surgical evaluation. A staging pelvic MRI was done on 06/12/19 which identified the tumor at 11.4 cm from the anal/verge/skin surface and 6.5 cm from internal  anal sphincter measuring 8.3 cm in length and involving the left lateral pelvic sidewall with 2 mesorectal LNs, locally staged T4N1. He was referred to [place] to discuss neoadjuvant treatment and further management.   His PMH is significant for BPH and neurocognitive impairment followed by [facility]. He is independent of ADLs, drives, and continues to work full time at [employer] office. He is reliable historian. He is single, no children. He has 5 living siblings 6 total. He denies alcohol, tobacco, or drug use. His father had colon, prostate, and brain cancer, MGM had stomach cancer and died at age 73.  Today, he presents with his sister. He feels well. Energy and appetite are adequate. He has 10 lbs weight loss in last 3-6 months. For 1-2 years he has more frequent loose stool. Denies rectal bleeding or pain. Denies n/v/c/d. Denies recent fever, chills, cough, chest pain, dyspnea, leg swelling except from an ankle strain, or baseline neuropathy.   MEDICAL HISTORY:  Past Medical History:  Diagnosis Date  . Benign prostatic hyperplasia 03/30/2013   10/1 IMO update  . 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 PROSTATE SURGERY  2004  . TONSILECTOMY, ADENOIDECTOMY, BILATERAL MYRINGOTOMY AND TUBES      SOCIAL HISTORY: Social History   Socioeconomic History  . Marital status: Single    Spouse name: Not on file  . Number of children: 0  . Years of education: 16  . Highest education level: Bachelor's degree (e.g., BA, AB, BS)  Occupational History  . Not on file  Tobacco Use  . Smoking status: Never Smoker  . Smokeless tobacco: Never Used  Substance and Sexual Activity  . Alcohol use: No  . Drug use: No  . Sexual  activity: Not Currently  Other Topics Concern  . Not on file  Social History Narrative   4 year College- history major      Pts mother lives with him   Social Determinants of Health   Financial Resource Strain:   . Difficulty of Paying Living Expenses:   Food Insecurity:   . Worried About Charity fundraiser in the Last Year:   . Arboriculturist in the Last Year:   Transportation Needs:   . Film/video editor (Medical):   Marland Kitchen Lack of Transportation (Non-Medical):   Physical Activity:   . Days of Exercise per Week:   . Minutes of Exercise per Session:   Stress:   . Feeling of Stress :   Social Connections:   . Frequency of Communication with Friends and Family:   . Frequency of Social Gatherings with Friends and Family:   . Attends Religious Services:   . Active Member of Clubs or Organizations:   . Attends Archivist Meetings:   Marland Kitchen Marital Status:   Intimate Partner Violence:   . Fear of Current or Ex-Partner:   . Emotionally Abused:   Marland Kitchen Physically Abused:   . Sexually Abused:     FAMILY HISTORY: Family History  Problem Relation Age of Onset  . Osteoporosis Mother   . Dementia Mother   . Diabetes Mother   . Brain cancer Father     ALLERGIES:  has No Known Allergies.  MEDICATIONS:  Current Outpatient Medications  Medication Sig Dispense Refill  . acetaminophen (TYLENOL) 325 MG tablet Take 650 mg by mouth every 6 (six) hours as needed for moderate pain (Arthitic Pain).    Marland Kitchen  Multiple Vitamins-Minerals (MULTIVITAMIN WITH MINERALS) tablet Take 1 tablet by mouth daily.    . NON FORMULARY 1 tablet daily. Mind works     No current facility-administered medications for this visit.    REVIEW OF SYSTEMS:   Constitutional: Denies fevers, chills or abnormal night sweats (+) weight loss  Eyes: Denies blurriness of vision, double vision or watery eyes Ears, nose, mouth, throat, and face: Denies mucositis or sore throat Respiratory: Denies cough, dyspnea or  wheezes Cardiovascular: Denies palpitation, chest discomfort or lower extremity swelling Gastrointestinal:  Denies nausea, vomiting, constipation, diarrhea, hematochezia, heartburn (+) increased frequency loose stool  Skin: Denies abnormal skin rashes Lymphatics: Denies new lymphadenopathy or easy bruising Neurological:Denies numbness, tingling or new weaknesses Behavioral/Psych: Mood is stable, no new changes  All other systems were reviewed with the patient and are negative.  PHYSICAL EXAMINATION: ECOG PERFORMANCE STATUS: 0 - Asymptomatic  Vitals:   06/18/19 1003  BP: (!) 151/73  Pulse: (!) 110  Resp: 20  Temp: 99.1 F (37.3 C)  SpO2: 100%   Filed Weights   06/18/19 1003  Weight: 131 lb 12.8 oz (59.8 kg)    GENERAL:alert, no distress and comfortable SKIN: no rash  EYES:  sclera clear NECK: without mass LUNGS: clear with normal breathing effort HEART: regular rate & rhythm, no lower extremity edema ABDOMEN:abdomen soft, non-tender and normal bowel sounds. No hepatomegaly  RECTAL: deferred  PSYCH: alert & oriented x 3 with fluent speech NEURO: no focal motor/sensory deficits  LABORATORY DATA:  I have reviewed the data as listed CBC Latest Ref Rng & Units 04/03/2019 04/03/2019  WBC 4.0 - 10.5 K/uL - 8.2  Hemoglobin 13.0 - 17.0 g/dL 13.6 13.7  Hematocrit 39.0 - 52.0 % 40.0 42.4  Platelets 150 - 400 K/uL - 230   CMP Latest Ref Rng & Units 04/03/2019 04/03/2019  Glucose 70 - 99 mg/dL 96 102(H)  BUN 8 - 23 mg/dL 11 10  Creatinine 0.61 - 1.24 mg/dL 0.70 0.84  Sodium 135 - 145 mmol/L 137 137  Potassium 3.5 - 5.1 mmol/L 3.8 3.8  Chloride 98 - 111 mmol/L 102 104  CO2 22 - 32 mmol/L - 23  Calcium 8.9 - 10.3 mg/dL - 8.7(L)  Total Protein 6.5 - 8.1 g/dL - 6.4(L)  Total Bilirubin 0.3 - 1.2 mg/dL - 1.1  Alkaline Phos 38 - 126 U/L - 72  AST 15 - 41 U/L - 23  ALT 0 - 44 U/L - 23    RADIOGRAPHIC STUDIES: I have personally reviewed the radiological images as listed and agreed  with the findings in the report. CT CHEST W CONTRAST  Result Date: 06/05/2019 CLINICAL DATA:  74 year old male with colonic mass identified on recent colonoscopy. History of prostate cancer. No current abdominal pain. EXAM: CT CHEST, ABDOMEN, AND PELVIS WITH CONTRAST TECHNIQUE: Multidetector CT imaging of the chest, abdomen and pelvis was performed following the standard protocol during bolus administration of intravenous contrast. CONTRAST:  190mL ISOVUE-300 IOPAMIDOL (ISOVUE-300) INJECTION 61% COMPARISON:  None. FINDINGS: CT CHEST FINDINGS Cardiovascular: Heart size is normal. LAD coronary artery atherosclerotic calcifications noted. No thoracic aortic aneurysm or pericardial effusion. Mediastinum/Nodes: No enlarged mediastinal, hilar, or axillary lymph nodes. Thyroid gland, trachea, and esophagus demonstrate no significant findings. Lungs/Pleura: The following RIGHT pulmonary nodules are noted: A 3 mm RIGHT LOWER lobe SUPERIOR segment nodule (series 4: Image 62) A 3 mm subpleural RIGHT LOWER lobe nodule (4:77) A 4 mm RIGHT LOWER lobe nodule (4:90): No other pulmonary nodules or masses identified.  No pleural effusion, pneumothorax, airspace disease or consolidation. Minimal bibasilar atelectasis/scarring noted. Musculoskeletal: No acute or suspicious bony abnormalities are noted. A remote T4 compression fracture is noted. CT ABDOMEN PELVIS FINDINGS Hepatobiliary: Cholelithiasis and gallbladder wall thickening noted. Equivocal pericholecystic inflammation noted. The liver is unremarkable. No biliary dilatation. Pancreas: Unremarkable Spleen: Unremarkable Adrenals/Urinary Tract: A 2.5 cm irregular cystic mass within the anteromedial RIGHT kidney is noted. Probable bilateral renal cysts are present. A 4 mm nonobstructing LEFT LOWER pole renal calculus is identified. No hydronephrosis or obstructing urinary calculi noted. Stomach/Bowel: An approximately 5 cm irregular mass within distal sigmoid colon is noted with  possible extension through the wall. No definite abnormal adjacent lymph nodes are noted. No other definite bowel wall thickening noted. No bowel obstruction identified. The appendix is normal. Vascular/Lymphatic: Aortic atherosclerosis. No enlarged abdominal or pelvic lymph nodes. Reproductive: Slightly heterogeneous prostate noted. Other: Mild haziness within the omentum noted. No ascites, pneumoperitoneum or focal collection. Musculoskeletal: No acute or suspicious bony abnormalities are identified. Degenerative changes in the lumbar spine are noted. IMPRESSION: 1. Cholelithiasis and gallbladder wall thickening with equivocal pericholecystic inflammation. Acute cholecystitis not excluded. If there is clinical suspicion for acute cholecystitis, recommend ultrasound evaluation. 2. Approximately 5 cm irregular mass within the distal sigmoid colon with possible extension through the wall. No definite abnormal adjacent lymph nodes. 3. Mild omental haziness-nonspecific. Metastatic disease not excluded. 4. Indeterminate 3-4 mm RIGHT pulmonary nodules which could represent metastatic disease. These are too small for PET CT evaluation. Consider CT follow-up. 5. 2.5 cm irregular cystic mass within the RIGHT kidney. Elective MRI recommended for further evaluation. 6. 4 mm nonobstructing LEFT LOWER pole renal calculus. 7. Coronary artery disease. 8. Aortic Atherosclerosis (ICD10-I70.0). These results will be called to the ordering clinician or representative by the Radiologist Assistant, and communication documented in the PACS or [employer]. Electronically Signed   By: Margarette Canada M.D.   On: 06/05/2019 11:18   MR PELVIS WO CONTRAST  Result Date: 06/14/2019 CLINICAL DATA:  Newly diagnosed rectal carcinoma.  Staging. EXAM: MRI PELVIS WITHOUT CONTRAST TECHNIQUE: Multiplanar multisequence MR imaging of the pelvis was performed. No intravenous contrast was administered. Small amount of [place] gel was administered per rectum  to optimize tumor evaluation. COMPARISON:  None. FINDINGS: TUMOR LOCATION Tumor distance from Anal Verge/Skin Surface:  11.4 cm Tumor distance to Internal Anal Sphincter: 6.5 cm TUMOR DESCRIPTION Circumferential Extent: 100% Tumor Length: 8.3 cm T - CATEGORY Extension through Muscularis Propria: Yes>31mm=T3d Shortest Distance of any tumor/node from Mesorectal Fascia: 0 mm, along the left lateral and posterior walls (tumor abuts the left lateral pelvic sidewall and sacrum) a Extramural Vascular Invasion/Tumor Thrombus: No Invasion of Anterior Peritoneal Reflection: No Involvement of Adjacent Organs or Pelvic Sidewall: Yes, involving the left lateral pelvic sidewall =T4 Levator Ani Involvement: No N - CATEGORY Mesorectal Lymph Nodes >=53mm: 2=N1 Extra-mesorectal Lymphadenopathy: No Other:  None. IMPRESSION: Rectal adenocarcinoma T stage: T4 Rectal adenocarcinoma N stage:  N1 Distance from tumor to the internal anal sphincter is 6.5 cm. Electronically Signed   By: Marlaine Hind M.D.   On: 06/14/2019 18:47   CT ABDOMEN PELVIS W CONTRAST  Result Date: 06/05/2019 CLINICAL DATA:  74 year old male with colonic mass identified on recent colonoscopy. History of prostate cancer. No current abdominal pain. EXAM: CT CHEST, ABDOMEN, AND PELVIS WITH CONTRAST TECHNIQUE: Multidetector CT imaging of the chest, abdomen and pelvis was performed following the standard protocol during bolus administration of intravenous contrast. CONTRAST:  136mL ISOVUE-300 IOPAMIDOL (ISOVUE-300)  INJECTION 61% COMPARISON:  None. FINDINGS: CT CHEST FINDINGS Cardiovascular: Heart size is normal. LAD coronary artery atherosclerotic calcifications noted. No thoracic aortic aneurysm or pericardial effusion. Mediastinum/Nodes: No enlarged mediastinal, hilar, or axillary lymph nodes. Thyroid gland, trachea, and esophagus demonstrate no significant findings. Lungs/Pleura: The following RIGHT pulmonary nodules are noted: A 3 mm RIGHT LOWER lobe SUPERIOR segment  nodule (series 4: Image 62) A 3 mm subpleural RIGHT LOWER lobe nodule (4:77) A 4 mm RIGHT LOWER lobe nodule (4:90): No other pulmonary nodules or masses identified. No pleural effusion, pneumothorax, airspace disease or consolidation. Minimal bibasilar atelectasis/scarring noted. Musculoskeletal: No acute or suspicious bony abnormalities are noted. A remote T4 compression fracture is noted. CT ABDOMEN PELVIS FINDINGS Hepatobiliary: Cholelithiasis and gallbladder wall thickening noted. Equivocal pericholecystic inflammation noted. The liver is unremarkable. No biliary dilatation. Pancreas: Unremarkable Spleen: Unremarkable Adrenals/Urinary Tract: A 2.5 cm irregular cystic mass within the anteromedial RIGHT kidney is noted. Probable bilateral renal cysts are present. A 4 mm nonobstructing LEFT LOWER pole renal calculus is identified. No hydronephrosis or obstructing urinary calculi noted. Stomach/Bowel: An approximately 5 cm irregular mass within distal sigmoid colon is noted with possible extension through the wall. No definite abnormal adjacent lymph nodes are noted. No other definite bowel wall thickening noted. No bowel obstruction identified. The appendix is normal. Vascular/Lymphatic: Aortic atherosclerosis. No enlarged abdominal or pelvic lymph nodes. Reproductive: Slightly heterogeneous prostate noted. Other: Mild haziness within the omentum noted. No ascites, pneumoperitoneum or focal collection. Musculoskeletal: No acute or suspicious bony abnormalities are identified. Degenerative changes in the lumbar spine are noted. IMPRESSION: 1. Cholelithiasis and gallbladder wall thickening with equivocal pericholecystic inflammation. Acute cholecystitis not excluded. If there is clinical suspicion for acute cholecystitis, recommend ultrasound evaluation. 2. Approximately 5 cm irregular mass within the distal sigmoid colon with possible extension through the wall. No definite abnormal adjacent lymph nodes. 3. Mild  omental haziness-nonspecific. Metastatic disease not excluded. 4. Indeterminate 3-4 mm RIGHT pulmonary nodules which could represent metastatic disease. These are too small for PET CT evaluation. Consider CT follow-up. 5. 2.5 cm irregular cystic mass within the RIGHT kidney. Elective MRI recommended for further evaluation. 6. 4 mm nonobstructing LEFT LOWER pole renal calculus. 7. Coronary artery disease. 8. Aortic Atherosclerosis (ICD10-I70.0). These results will be called to the ordering clinician or representative by the Radiologist Assistant, and communication documented in the PACS or [employer]. Electronically Signed   By: Margarette Canada M.D.   On: 06/05/2019 11:18    ASSESSMENT & PLAN: 74 yo male with   1. Adenocarcinoma of the rectum, cT4N1M0 stage III -we reviewed his medical record and work up in detail with the patient. He was found to have proximal rectal mass on first screening colonoscopy, with associated frequent stool and 10 lbs weight loss.  -Work up showed small indeterminate pulmonary nodules, no distant metastasis. This was locally staged T4N1 on MRI. There was omental haziness noted on CT, which was not obvious on MRI.  -We plan to discuss his case in GI tumor board next week to determine if a PET scan is necessary  -Although he is elderly he is in good physical condition with relatively no medical co-morbidities; he would likely tolerate treatment well.  -We discussed the standard of care for locally advanced stage III rectal cancer is total neoadjuvant chemoradiation, including 4 months of FOLFOX followed by chemoRT with Xeloda for 5-6 weeks prior to proceeding with surgery. We plan to restage after 3 months of chemo. He has seen Dr.  Thomas. The treatment goal is curative.  -Chemotherapy consent: Side effects including but not limited to fatigue, nausea, vomiting, diarrhea, hair thinning/loss, neuropathy, cold sensitivity, fluid retention, renal and kidney dysfunction, neutropenic  fever, need for blood transfusion, bleeding, were discussed with patient in great detail. He agrees to proceed. -He is being referred for [facility] placement by Dr. Marcello Moores and chemo education class. He is requesting to see a dietician and the referral has been made.  -We will f/u on recommendation from tumor board -He prefers to continue working if possible.  -He is interested in exact science study, will see research nurse today  -He will return for baseline lab and f/u with first treatment in 2 weeks   2. Mild cognitive impairment -followed by [facility] -He is a reliable historian, independent with ADLs, a/o x4, no signs of obvious impairment -will monitor for neurotoxicities on chemo, especially oxaliplatin   PLAN: -record reviewed -Discuss exact science with research nurse today  -Discuss case in GI tumor board, will f/u on the recommendation regarding PET scan  -Chemo class and dietician next week -Port placement by Dr. Marcello Moores before first treatment  -Lab, f/u and cycle 1 FOLFOX in 2 weeks   Orders Placed This Encounter  Procedures  . CBC with Differential (Cancer Center Only)    Standing Status:   Standing    Number of Occurrences:   50    Standing Expiration Date:   06/17/2020  . CMP ([place] only)    Standing Status:   Standing    Number of Occurrences:   50    Standing Expiration Date:   06/17/2020  . CEA (IN HOUSE-[facility])    Standing Status:   Standing    Number of Occurrences:   50    Standing Expiration Date:   06/17/2020  . Ambulatory referral to Nutrition and Diabetic E    Referral Priority:   Routine    Referral Type:   Consultation    Referral Reason:   Specialty Services Required    Number of Visits Requested:   1    All questions were answered. The patient knows to call the clinic with any problems, questions or concerns.     Alla Feeling, NP 06/18/2019   Addendum  I have seen the patient, examined him. I agree with the assessment and and plan and  have edited the notes.   Carlos [name] is a 74 yo male present with frequent BM and 10 lbs weight loss.  He underwent his first screening colonoscopy which unfortunately showed a partially obstructing mass in the upper rectum, biopsy confirmed invasive adenocarcinoma, at least intramucosal.  I reviewed his CT and pelvic MRI scan in person, and discussed with patient.  Given the locally advanced T4N1 disease, I recommend total neoadjuvant chemotherapy and concurrent chemoradiation, followed by surgical resection.  He has seen by Dr. Marcello Moores, and will see radiation oncology Dr. Lisbeth Renshaw.  His CT scan showed suspicion for omentum thickening, will review in our GI tumor board next week, to see if he needs further imaging study.  We reviewed the benefit and side effect of chemotherapy FOLFOX, he agrees to proceed.  We will ask Dr. Marcello Moores to place a port, and schedule chemo class.  We will call him after GI conference discussion next Wednesday. Plan to start chemo in about 2 weeks.  Truitt Merle  06/19/2019

## 2019-06-18 NOTE — Progress Notes (Signed)
Met with patient and his sister [name] at their initial medical oncology appointment with Cira Rue NP and Dr. Burr Medico.  I explained my role as navigator and they were given my card with my direct phone number and encouraged to call with any questions or concerns.  They both verbalized an understanding.   Met with Cira Rue NP after visit to discuss treatment plan.

## 2019-06-18 NOTE — Research (Signed)
06/18/2019 [employer] 2018-01 Blood Sample collection to Evaluate Biomarkers in Subject with Untreated Solid Tumors  Met with patient and sister, Leeanne [facility]), x 20 minutes today to provide an overview of the research blood sample collection and health information collection for the [place] 2018-01 study. Discussion included purpose of study, study procedures, risks and benefits and $50 gift card reimbursement for completion of study procedures, all as outlined in the Informed Consent document. Patient was given a copy of the "[place]" Version 3.0 for the [employer] 2018-01 study along with my business card to establish future contact. Patient will review the contents of the form to determine if he wishes to proceed with the study.  Patient understands that study participation is entirely voluntary, and that it is his decision about whether or not he would like to be in the study. Plans made to follow up with the patient after schedules for port placement, education and treatment have been arranged, noting that samples must be collected prior to treatment start. Patient is aware that he need not sign the consent form prior to his return visit, rather we would review it together and have all of his questions answered prior to signing. Patient was told he could call and leave a voice mail at any time at my direct number [phone] if he makes a decision one way or the other. Patient and Ms. Volanda Napoleon are in agreement with this plan.  Cindy S. Brigitte Pulse BSN, RN, CCRP 06/18/2019 1:48 PM

## 2019-06-18 NOTE — Telephone Encounter (Signed)
Scheduled appt per 5/13 los - gave patient AVS and calender.

## 2019-06-19 ENCOUNTER — Other Ambulatory Visit: Payer: Self-pay | Admitting: General Surgery

## 2019-06-19 NOTE — Progress Notes (Signed)
Subjective:   Patient ID: Carlos [name], male   DOB: 74 y.o.   MRN: [id number]   HPI Patient presents with caregiver with a lot of pain on the inside of the left ankle stating that it has been going on now for around 3 months and that it is worse when he stands or tries to be active and he still does work.  Patient is noted to have no history of smoking and does like to be active but has trouble because of the pain he is having   Review of Systems  All other systems reviewed and are negative.       Objective:  Physical Exam Vitals and nursing note reviewed.  Constitutional:      Appearance: He is well-developed.  Pulmonary:     Effort: Pulmonary effort is normal.  Musculoskeletal:        General: Normal range of motion.  Skin:    General: Skin is warm.  Neurological:     Mental Status: He is alert.     Neurovascular status intact muscle strength found to be adequate range of motion within normal limits.  Patient is found to have flat type arch and is noted to have muscle strength adequate with posterior tibial inflammation as it gets near the navicular and inserts into the navicular.  There is mild swelling in the area but no increased warmth or erythema to the area noted.  Patient has good digital perfusion well oriented x3     Assessment:  Probability for posterior tibial tendinitis left secondary to foot structure with possibility for other injury secondary to the structure of the foot     Plan:  H&P x-rays conditions reviewed and today I did a sterile prep and injected the sheath posterior tibial tendon 3 mg dexamethasone Kenalog 5 mg Xylocaine and applied fascial brace to lift the arch.  Gave instructions on supportive shoes and reappoint 2 weeks and discuss long-term orthotics  X-rays indicate that there is moderate depression of the arch and indication of some subtalar joint arthritis noted left

## 2019-06-22 ENCOUNTER — Ambulatory Visit: Payer: 59 | Admitting: Hematology

## 2019-06-22 ENCOUNTER — Other Ambulatory Visit (HOSPITAL_COMMUNITY): Payer: Self-pay | Admitting: Surgery

## 2019-06-22 ENCOUNTER — Telehealth: Payer: Self-pay | Admitting: *Deleted

## 2019-06-22 NOTE — Telephone Encounter (Signed)
06/22/2019 [employer] 2018-01 consent f/u   Called patient's home phone number for follow up regarding the [place] 2018-01 Blood Sample collection to Evaluate Biomarkers in Subject with Untreated Solid Tumors research study. Patient's sister, Carlos [name], answered the phone, stating that she is also involved in Carlos [name]' care. Currently, Mr. Desautel and his sister Carlos [name] are at an appointment with the surgeon regarding port placement. Explained that I was calling to see whether patient and family members had read the research consent form and if he was interested in participating. Discussed scheduling a consent visit at the time of his appointments on Thursday, May 20th, prior to dietitian appointment. Carlos [name] confirmed ability to come in with patient at 2:45pm for a research consent appointment. Plans will be made to collect lab samples on Day 1 of treatment, based on review of records and scheduling of procedures. Scheduling request sent by Research nurse.  Cindy S. Brigitte Pulse BSN, RN, CCRP 06/22/2019 11:40 AM

## 2019-06-24 ENCOUNTER — Ambulatory Visit: Payer: 59

## 2019-06-24 ENCOUNTER — Other Ambulatory Visit: Payer: Self-pay

## 2019-06-24 ENCOUNTER — Other Ambulatory Visit: Payer: Self-pay | Admitting: Hematology

## 2019-06-24 ENCOUNTER — Other Ambulatory Visit: Payer: Self-pay | Admitting: Gastroenterology

## 2019-06-24 ENCOUNTER — Institutional Professional Consult (permissible substitution): Payer: 59 | Admitting: Radiation Oncology

## 2019-06-24 ENCOUNTER — Other Ambulatory Visit (HOSPITAL_COMMUNITY): Payer: Self-pay | Admitting: Gastroenterology

## 2019-06-24 DIAGNOSIS — C20 Malignant neoplasm of rectum: Secondary | ICD-10-CM

## 2019-06-24 NOTE — Progress Notes (Signed)
Patient's sister calls, I spoke to her at length about his appointments, tumor board recommendation was to obtain PET scan, we will proceed with PA and get scheduled.  He is having his port placed on 5/26.  I have made a note in the chart that everyone needs to call her regarding his appointments.  They have not heard from [facility] as of yet but she will call me when they do.

## 2019-06-25 ENCOUNTER — Inpatient Hospital Stay: Payer: 59 | Admitting: Nutrition

## 2019-06-25 ENCOUNTER — Other Ambulatory Visit: Payer: Self-pay

## 2019-06-25 ENCOUNTER — Other Ambulatory Visit: Payer: 59

## 2019-06-25 ENCOUNTER — Inpatient Hospital Stay: Payer: 59 | Admitting: *Deleted

## 2019-06-25 ENCOUNTER — Inpatient Hospital Stay: Payer: 59

## 2019-06-25 ENCOUNTER — Encounter: Payer: Self-pay | Admitting: Dietician

## 2019-06-25 DIAGNOSIS — C20 Malignant neoplasm of rectum: Secondary | ICD-10-CM

## 2019-06-25 LAB — SURGICAL PATHOLOGY

## 2019-06-25 NOTE — Research (Signed)
06/25/2019 Research - [employer] 2018-01 Consent visit  Met with patient and sister, [name], upon arrival to clinic today. The "RESEARCH SUBJECT CONSENT FORM AND HIPAA AUTHORIZATION" form, Version 3.0, IRB approved as modified 12/03/2017, was reviewed with the patient in its entirety and all of the patient's questions were answered. Patient is aware that study procedures involve collection of blood sample (5 tubes of blood for research purposes) as well as collection of demographic and medical information for the purposes of the study. Patient states that he believes research is very important and anything he can do to assist, he would like to do. He states that he is in agreement with the collection of a one-time blood sample and collection of his health information. He understands the voluntary nature of the study and knows that he can change his mind at any time. Explained the possibility that samples may not be collected if he receives any contrast media within 24 hours of the expected blood draw, which is scheduled to occur prior to the start of treatment on 07/02/2019. Patient is aware that he will receive a $50 gift card after blood sample tubes have been collected. Patient is in agreement with this plan. The consent and authorization form was signed and dated by the patient and a copy of the signed form was given to him for his records. Thanked patient for his willingness to participate in this research study.  Eligibility previously reviewed and confirmed by [facility] RN, as long as no IV contrast administered or biopsies obtained between now and blood sample collection, planned for the day of treatment start on 07/02/2019.   Cindy S. Brigitte Pulse BSN, RN, [place] 06/25/2019 3:45 PM

## 2019-06-25 NOTE — Progress Notes (Unsigned)
Nutrition Assessment   Reason for Assessment: Consult    ASSESSMENT: 74 year old male recently diagnosed with colorectal cancer pending neoadjuvant treatments, followed by Dr. Feng. Plans for surgical resection following chemoradiation therapies.   PMH: BPH, neurocognitive impairment followed by [facility].   Met with patient and patient's sister today in office. Patient works 8 hr shifts at the [employer] and resides at his mother's house. Patient's mother has a caregiver who comes in daily and prepares breakfast meals and typically packs a lunch for patient to take with him to work. Patient states that he is a sporadic eater, reports consistently eating breakfast recalls waffles, eggs, bacon, sometimes applesauce or banana. If pt eats lunch, it is typically a bologna or ham sandwich and usually will eat meat with vegetable and starch dinner with his mom on Thursday and Friday's. Patient reports that he likes to snack on nabs, chips, candy, drinks 2-3 diet sodas, hot tea, and drinks 1 bottle of water daily. Patient endorses wt loss, currently he weighs 131.6 lbs, increased from 130 lb on 4/5, which decreased from 132.3 lb on 2/26 and 134 lb on 2/12. Patient recalls weighing ~160 lbs in the previous year. In April of 2020, he weighed 144 lbs, last weight prior documented in 2015 and he weighed 140.9 lb Patient denies nausea, constipation, endorses more frequent loose stools.   RD discussed increased nutrition needs and the importance of adequate intake of calories, protein, and fluids during treatment to minimize weight loss. Encouraged patient to begin daily intake of small frequent meals, including high protein snacks, increasing daily intake of water, recommended nutrition supplement, discussed different options available to patient.   Nutrition Focused Physical Exam: Deferred  Medications: MVI Labs: no new labs available  Anthropometrics:  Height: 5'7" Weight: 131.6 lb (59.8  kg) UBW: 144 lb (05/2019) BMI: 20.64 m2   Estimated Energy Needs  Kcals: 1800-2100 Protein: 80-90 Fluid: >/= 2.1 L   NUTRITION DIAGNOSIS: Food and nutrition knowledge deficit related to newly diagnosed colorectal cancer and associated treatments as evidenced by no prior need for nutrition related information.  INTERVENTION: Provided education on small frequent meals/snacks with examples, discussed the importance of adequate calorie/protein/fluid intake during treatment, answered questions regarding nutrition impact symptoms of chemotherapy, provided 2 sample bottles of Ensure, coupons, and contact information.   MONITORING, EVALUATION, GOAL: Patient will tolerated adequate calories and protein to minimize wt loss throughout treatment.    Next Visit: To be scheduled pending initiation of chemotherapy treament  Suzanne , RD, LDN Clinical Nutrition After Hours/Weekend Pager # in Amion  

## 2019-06-26 ENCOUNTER — Other Ambulatory Visit: Payer: Self-pay | Admitting: Hematology

## 2019-06-26 ENCOUNTER — Other Ambulatory Visit: Payer: Self-pay

## 2019-06-26 ENCOUNTER — Other Ambulatory Visit: Payer: 59

## 2019-06-26 DIAGNOSIS — C20 Malignant neoplasm of rectum: Secondary | ICD-10-CM

## 2019-06-26 DIAGNOSIS — Q991 46, XX true hermaphrodite: Secondary | ICD-10-CM

## 2019-06-26 MED ORDER — PROCHLORPERAZINE MALEATE 10 MG PO TABS: 10.0000 mg | ORAL_TABLET | Freq: Four times a day (QID) | ORAL | 1 refills | Status: DC | PRN

## 2019-06-26 MED ORDER — ONDANSETRON HCL 8 MG PO TABS: 8.0000 mg | ORAL_TABLET | Freq: Two times a day (BID) | ORAL | 1 refills | Status: DC | PRN

## 2019-06-26 MED ORDER — LIDOCAINE-PRILOCAINE 2.5-2.5 % EX CREA: TOPICAL_CREAM | CUTANEOUS | 3 refills | Status: DC

## 2019-06-26 NOTE — Progress Notes (Signed)
On 06/24/2019 at 1425 I faxed the letter patient's sister requested regarding him receiving treatment here and that he should not work more than 40 hours per week to his supervisor Levora Dredge at [employer] at fax [phone]

## 2019-06-26 NOTE — Progress Notes (Signed)
START ON PATHWAY REGIMEN - Colorectal     A cycle is every 14 days:     Oxaliplatin      Leucovorin      Fluorouracil      Fluorouracil   **Always confirm dose/schedule in your pharmacy ordering system**  Patient Characteristics: Preoperative or Nonsurgical Candidate (Clinical Staging), Rectal, cT3 - cT4, cN0 or Any cT, cN+ Tumor Location: Rectal Therapeutic Status: Preoperative or Nonsurgical Candidate (Clinical Staging) AJCC T Category: cT4 AJCC N Category: cN1 AJCC M Category: cM0 AJCC 8 Stage Grouping: Unknown Intent of Therapy: Curative Intent, Discussed with Patient

## 2019-06-26 NOTE — Patient Instructions (Signed)
DUE TO COVID-19 ONLY ONE VISITOR IS ALLOWED TO COME WITH YOU AND STAY IN THE WAITING ROOM ONLY DURING PRE OP AND PROCEDURE DAY OF SURGERY. THE 1 VISITOR MAY VISIT WITH YOU AFTER SURGERY IN YOUR PRIVATE ROOM DURING VISITING HOURS ONLY!  YOU NEED TO HAVE A COVID 19 TEST ON: 06/27/19, THIS TEST MUST BE DONE BEFORE SURGERY, COME  [address], [address] [place] , [address].  ([place]) ONCE YOUR COVID TEST IS COMPLETED, PLEASE BEGIN THE QUARANTINE INSTRUCTIONS AS OUTLINED IN YOUR HANDOUT.                Whitney Post   Your procedure is scheduled on: 07/01/19   Report to [facility] Main  Entrance   Report to admitting at: 6:30  AM     Call this number if you have problems the morning of surgery [phone]    Remember: Do not eat SOLID food :After Midnight. Clear liquids from midnight until 5:30 am.    CLEAR LIQUID DIET   Foods Allowed                                                                     Foods Excluded  Coffee and tea, regular and decaf                             liquids that you cannot  Plain Jell-O any favor except red or purple                                           see through such as: Fruit ices (not with fruit pulp)                                     milk, soups, orange juice  Iced Popsicles                                    All solid food Carbonated beverages, regular and diet                                    Cranberry, grape and apple juices Sports drinks like Gatorade Lightly seasoned clear broth or consume(fat free) Sugar, honey syrup  Sample Menu Breakfast                                Lunch                                     Supper Cranberry juice                    Beef broth  Chicken broth Jell-O                                     Grape juice                           Apple juice Coffee or tea                        Jell-O                                      Popsicle                            Coffee or tea                        Coffee or tea  _____________________________________________________________________  BRUSH YOUR TEETH MORNING OF SURGERY AND RINSE YOUR MOUTH OUT, NO CHEWING GUM CANDY OR MINTS.                                   You may not have any metal on your body including hair pins and              piercings  Do not wear jewelry, lotions, powders or perfumes, deodorant             Men may shave face and neck.   Do not bring valuables to the hospital. [place].  Contacts, dentures or bridgework may not be worn into surgery.  Leave suitcase in the car. After surgery it may be brought to your room.     Patients discharged the day of surgery will not be allowed to drive home. IF YOU ARE HAVING SURGERY AND GOING HOME THE SAME DAY, YOU MUST HAVE AN ADULT TO DRIVE YOU HOME AND BE WITH YOU FOR 24 HOURS. YOU MAY GO HOME BY TAXI OR UBER OR ORTHERWISE, BUT AN ADULT MUST ACCOMPANY YOU HOME AND STAY WITH YOU FOR 24 HOURS.  Name and phone number of your driver:  Special Instructions: N/A              Please read over the following fact sheets you were given: _____________________________________________________________________ [facility] - Preparing for Surgery Before surgery, you can play an important role.  Because skin is not sterile, your skin needs to be as free of germs as possible.  You can reduce the number of germs on your skin by washing with CHG (chlorahexidine gluconate) soap before surgery.  CHG is an antiseptic cleaner which kills germs and bonds with the skin to continue killing germs even after washing. Please DO NOT use if you have an allergy to CHG or antibacterial soaps.  If your skin becomes reddened/irritated stop using the CHG and inform your nurse when you arrive at Short Stay. Do not shave (including legs and underarms) for at least 48 hours prior to the first CHG shower.  You  may shave your face/neck. Please follow these instructions carefully:  1.  Shower with  CHG Soap the night before surgery and the  morning of Surgery.  2.  If you choose to wash your hair, wash your hair first as usual with your  normal  shampoo.  3.  After you shampoo, rinse your hair and body thoroughly to remove the  shampoo.                           4.  Use CHG as you would any other liquid soap.  You can apply chg directly  to the skin and wash                       Gently with a scrungie or clean washcloth.  5.  Apply the CHG Soap to your body ONLY FROM THE NECK DOWN.   Do not use on face/ open                           Wound or open sores. Avoid contact with eyes, ears mouth and genitals (private parts).                       Wash face,  Genitals (private parts) with your normal soap.             6.  Wash thoroughly, paying special attention to the area where your surgery  will be performed.  7.  Thoroughly rinse your body with warm water from the neck down.  8.  DO NOT shower/wash with your normal soap after using and rinsing off  the CHG Soap.                9.  Pat yourself dry with a clean towel.            10.  Wear clean pajamas.            11.  Place clean sheets on your bed the night of your first shower and do not  sleep with pets. Day of Surgery : Do not apply any lotions/deodorants the morning of surgery.  Please wear clean clothes to the hospital/surgery center.  FAILURE TO FOLLOW THESE INSTRUCTIONS MAY RESULT IN THE CANCELLATION OF YOUR SURGERY PATIENT SIGNATURE_________________________________  NURSE SIGNATURE__________________________________  ________________________________________________________________________

## 2019-06-27 ENCOUNTER — Other Ambulatory Visit (HOSPITAL_COMMUNITY)
Admission: RE | Admit: 2019-06-27 | Discharge: 2019-06-27 | Disposition: A | Discharge status: Home / Self Care | Payer: 59 | Source: Ambulatory Visit | Attending: Surgery | Admitting: Surgery

## 2019-06-27 DIAGNOSIS — Z20822 Contact with and (suspected) exposure to covid-19: Secondary | ICD-10-CM | POA: Diagnosis not present

## 2019-06-27 DIAGNOSIS — Z01812 Encounter for preprocedural laboratory examination: Secondary | ICD-10-CM | POA: Diagnosis present

## 2019-06-27 LAB — SARS CORONAVIRUS 2 (TAT 6-24 HRS): SARS Coronavirus 2: NEGATIVE

## 2019-06-29 ENCOUNTER — Encounter (HOSPITAL_COMMUNITY): Payer: 59

## 2019-06-29 ENCOUNTER — Other Ambulatory Visit: Payer: Self-pay

## 2019-06-29 ENCOUNTER — Encounter (HOSPITAL_COMMUNITY)
Admission: RE | Admit: 2019-06-29 | Discharge: 2019-06-29 | Disposition: A | Discharge status: Home / Self Care | Payer: 59 | Source: Ambulatory Visit | Attending: Surgery | Admitting: Surgery

## 2019-06-29 ENCOUNTER — Encounter (HOSPITAL_COMMUNITY): Payer: Self-pay

## 2019-06-29 DIAGNOSIS — Z01812 Encounter for preprocedural laboratory examination: Secondary | ICD-10-CM | POA: Insufficient documentation

## 2019-06-29 HISTORY — DX: Malignant (primary) neoplasm, unspecified: C80.1

## 2019-06-29 HISTORY — DX: Anxiety disorder, unspecified: F41.9

## 2019-06-29 NOTE — Progress Notes (Signed)
PCP - Dr. Ellouise Newer. Cardiologist -   Chest x-ray -  EKG -  Stress Test -  ECHO -  Cardiac Cath -   Sleep Study -  CPAP -   Fasting Blood Sugar -  Checks Blood Sugar _____ times a day  Blood Thinner Instructions: Aspirin Instructions: Last Dose:  Anesthesia review:   Patient denies shortness of breath, fever, cough and chest pain at PAT appointment   Patient verbalized understanding of instructions that were given to them at the PAT appointment. Patient was also instructed that they will need to review over the PAT instructions again at home before surgery.

## 2019-06-30 ENCOUNTER — Other Ambulatory Visit (HOSPITAL_COMMUNITY): Payer: 59

## 2019-06-30 ENCOUNTER — Encounter (HOSPITAL_COMMUNITY)
Admission: RE | Admit: 2019-06-30 | Discharge: 2019-06-30 | Disposition: A | Discharge status: Home / Self Care | Payer: 59 | Source: Ambulatory Visit | Attending: Hematology | Admitting: Hematology

## 2019-06-30 ENCOUNTER — Other Ambulatory Visit (HOSPITAL_COMMUNITY)
Admission: RE | Admit: 2019-06-30 | Discharge: 2019-06-30 | Disposition: A | Discharge status: Home / Self Care | Payer: 59 | Source: Ambulatory Visit | Attending: Surgery | Admitting: Surgery

## 2019-06-30 DIAGNOSIS — Z01812 Encounter for preprocedural laboratory examination: Secondary | ICD-10-CM | POA: Insufficient documentation

## 2019-06-30 DIAGNOSIS — Z20822 Contact with and (suspected) exposure to covid-19: Secondary | ICD-10-CM | POA: Diagnosis not present

## 2019-06-30 LAB — CBC
HCT: 39.8 % (ref 39.0–52.0)
Hemoglobin: 12.9 g/dL — ABNORMAL LOW (ref 13.0–17.0)
MCH: 29.1 pg (ref 26.0–34.0)
MCHC: 32.4 g/dL (ref 30.0–36.0)
MCV: 89.8 fL (ref 80.0–100.0)
Platelets: 223 10*3/uL (ref 150–400)
RBC: 4.43 MIL/uL (ref 4.22–5.81)
RDW: 13.2 % (ref 11.5–15.5)
WBC: 7.3 10*3/uL (ref 4.0–10.5)
nRBC: 0 % (ref 0.0–0.2)

## 2019-06-30 LAB — SARS CORONAVIRUS 2 (TAT 6-24 HRS): SARS Coronavirus 2: NEGATIVE

## 2019-06-30 NOTE — H&P (Signed)
  Whitney Post  Location: [facility] Patient #: [id number] DOB: 12/12/1945 Married / Language: English / Race: Black or African American Male  History of Present Illness   The patient is a 74 year old male who presents with colorectal cancer.  His PCP is Dr. Leia Alf.  74 year old male who presents to the office for evaluation after colonoscopy shows a proximal rectal cancer. Biopsies show adenocarcinoma. CT scans of the chest, abdomen and pelvis have been completed. These images were reviewed independently by me, and it appears that he has a mass within the proximal rectum. He was also noted to have a 2.5 cm mass of the right kidney. MRI shows a large pelvic mass (T4) invading the left lateral pelvic sidewall. In the proximal 6.5 cm from the sphincter complex. He reports regular bowel habits. He denies any weight loss. No past surgical history. No major medical problems.   Allergies (Tanisha A. Owens Shark, [place]; 06/22/2019 11:17 AM) No Known Drug Allergies  [06/09/2019]: Allergies Reconciled   Medication History (Tanisha A. Owens Shark, [place]; 06/22/2019 11:17 AM) Multivitamin (Oral) Active. mind works (daily) Active. Medications Reconciled  Vitals (Tanisha A. Brown RMA; 06/22/2019 11:18 AM) 06/22/2019 11:17 AM Weight: 132.4 lb Height: 63in Body Surface Area: 1.62 m Body Mass Index: 23.45 kg/m  Temp.: 98.75F  Pulse: 102 (Regular)  BP: 124/82(Sitting, Left Arm, Standard)   Physical Exam  General Mental Status-Alert. General Appearance-Cooperative.   Assessment & Plan  1.  RECTAL CANCER (C20)  Impression: I have reviewed patient's MRI. This shows a large rectal tumor invading the left lateral pelvic sidewall. I recommended that they have a second opinion with a tertiary referral center for evaluation of extended surgical resection. They have elected to proceed with referral to [facility] colorectal surgery. In the meantime, we will work on doing a port placed  so that he can start chemotherapy and neoadjuvant treatment.  He has seen Dr. Burr Medico for medical oncology.  2.  Power port placement  I discussed the indications and potential complications of the power port placement.  The primary complications of the power port, include, but are not limited to, bleeding, infection, nerve injury, thrombosis, and pneumothorax.  I talked to his sister, [name] [id number]), who lives in [place]  Mr. Hankes number is [phone] (the number in Epic is his sister's number).   I also spoke to his sister [name] on the phone - who was in the house with him.  3.  Mild cognitive impairment  Followed by Dr. Jacqlyn Larsen, MD, [facility] Surgery Office phone:  [phone]

## 2019-06-30 NOTE — Progress Notes (Signed)
[facility]   [phone] [phone]   Clinic Follow up Note   Patient Care Team: [name], MD as PCP - General (Family Medicine) Cameron Sprang, MD as Consulting Physician (Neurology) Jonnie Finner, RN as Oncology Nurse Navigator Truitt Merle, MD as Consulting Physician (Hematology) Benson Norway, RN as Registered Nurse (Oncology)  Date of Service:  07/02/2019  CHIEF COMPLAINT: F/u of rectal Cancer   SUMMARY OF ONCOLOGIC HISTORY: Oncology History  Rectal adenocarcinoma ([place])  05/29/2019 Procedure   Colonoscopy per Dr. Therisa Doyne A digital rectal exam was normal  frond-like fungating infiltrative non-obstructing large mass in the rectum. The mass was partially circumferential, measured 5 cm in length. The colonoscopy also showed 11 sessile polyps in the transverse and asecnding colon and hepatic flexure.    05/29/2019 Initial Biopsy   Pathology on the rectal biopsy showed at least intramucosal adenocarcinoma involving tubulovillous adenoma with high grade dysplasia.   06/05/2019 Imaging   CT CAP IMPRESSION: 1. Cholelithiasis and gallbladder wall thickening with equivocal pericholecystic inflammation. Acute cholecystitis not excluded. If there is clinical suspicion for acute cholecystitis, recommend ultrasound evaluation. 2. Approximately 5 cm irregular mass within the distal sigmoid colon with possible extension through the wall. No definite abnormal adjacent lymph nodes. 3. Mild omental haziness-nonspecific. Metastatic disease not excluded. 4. Indeterminate 3-4 mm RIGHT pulmonary nodules which could represent metastatic disease. These are too small for PET CT evaluation. Consider CT follow-up. 5. 2.5 cm irregular cystic mass within the RIGHT kidney. Elective MRI recommended for further evaluation. 6. 4 mm nonobstructing LEFT LOWER pole renal calculus. 7. Coronary artery disease. 8. Aortic Atherosclerosis (ICD10-I70.0).   06/12/2019  Imaging   Staging MRI pelvis  FINDINGS: TUMOR LOCATION Tumor distance from Anal Verge/Skin Surface:  11.4 cm Tumor distance to Internal Anal Sphincter: 6.5 cm TUMOR DESCRIPTION Circumferential Extent: 100% Tumor Length: 8.3 cm T - CATEGORY Extension through Muscularis Propria: Yes>84mm=T3d Shortest Distance of any tumor/node from Mesorectal Fascia: 0 mm, along the left lateral and posterior walls (tumor abuts the left lateral pelvic sidewall and sacrum) Extramural Vascular Invasion/Tumor Thrombus: No Invasion of Anterior Peritoneal Reflection: No Involvement of Adjacent Organs or Pelvic Sidewall: Yes, involving the left lateral pelvic sidewall =T4 Levator Ani Involvement: No N - CATEGORY Mesorectal Lymph Nodes >=12mm: 2=N1 Extra-mesorectal Lymphadenopathy: No Other:  None. IMPRESSION: Rectal adenocarcinoma T stage: T4 Rectal adenocarcinoma N stage:  N1 Distance from tumor to the internal anal sphincter is 6.5 cm.   06/18/2019 Initial Diagnosis   Rectal adenocarcinoma (Carlos [name])   07/02/2019 -  Chemotherapy   Neoadjuvant FOLFOX q2weeks starting 07/02/19 for 4 months, followed by [facility]-ChemoRT      CURRENT THERAPY:  Neoadjuvant FOLFOX q2weeks starting 07/02/19 for 4 months, followed by [facility]-ChemoRT   INTERVAL HISTORY:  Carlos [name] is here for a follow up and treatment. She presents to the clinic with his sister and his other sister was called to be included in the visit. He had PAC placed yesterday and does not hurt. He has not been contacted by [facility]. They were not told which surgeon she referred her to. He completed chemo education class last week. His family was present. His sister wonders can he work through his treatment. He notes he reduced to 8 hour work day and has treatment days off. He may take off his first week off work if needed. They gave FMLA paperwork to use as needed for his care from 07/28/19. 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    REVIEW OF SYSTEMS:   Constitutional: Denies fevers, chills or abnormal weight loss Eyes: Denies blurriness of vision Ears, nose, mouth, throat, and face: Denies mucositis or sore throat Respiratory: Denies cough, dyspnea or wheezes Cardiovascular: Denies palpitation, chest discomfort or lower extremity swelling Gastrointestinal:  Denies nausea, heartburn or change in bowel habits Skin: Denies abnormal skin rashes Lymphatics: Denies new lymphadenopathy or easy bruising Neurological:Denies numbness, tingling or new weaknesses Behavioral/Psych: Mood is stable, no new changes  All other systems were reviewed with the patient and are negative.  MEDICAL HISTORY:  Past Medical History:  Diagnosis Date  . Anxiety   . Benign prostatic hyperplasia 03/30/2013   10/1 IMO update  . Cancer ([place])   . 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 PORTACATH PLACEMENT N/A 07/01/2019   Procedure: PORT ULTRASOUND GUIDED PLACEMENT;  Surgeon: Alphonsa Overall, MD;  Location: [facility];  Service: General;  Laterality: N/A;  . PROSTATE SURGERY  2004  . TONSILECTOMY, ADENOIDECTOMY, BILATERAL MYRINGOTOMY AND TUBES      I have reviewed the social history and family history with the patient and they are unchanged from previous note.  ALLERGIES:  has No Known Allergies.  MEDICATIONS:  Current Outpatient Medications  Medication Sig Dispense Refill  . acetaminophen (TYLENOL) 325 MG tablet Take 650 mg by mouth every 6 (six) hours as needed for moderate pain (pain).     Marland Kitchen lidocaine-prilocaine (EMLA) cream Apply to affected area once 30 g 3  . Misc Natural Products (FOCUSED MIND PO) Take 1 capsule by mouth daily. Shaklee Mindworks    . Multiple Vitamins-Minerals (MULTIVITAMIN WITH MINERALS) tablet Take 1 tablet by mouth daily. Centrum Silver    . ondansetron  (ZOFRAN) 8 MG tablet Take 1 tablet (8 mg total) by mouth 2 (two) times daily as needed for refractory nausea / vomiting. Start on day 3 after chemotherapy. 30 tablet 1  . prochlorperazine (COMPAZINE) 10 MG tablet Take 1 tablet (10 mg total) by mouth every 6 (six) hours as needed (Nausea or vomiting). 30 tablet 1  . traMADol (ULTRAM) 50 MG tablet Take 1 tablet (50 mg total) by mouth every 6 (six) hours as needed for moderate pain. 12 tablet 0   No current facility-administered medications for this visit.    PHYSICAL EXAMINATION: ECOG PERFORMANCE STATUS: 1 - Symptomatic but completely ambulatory  Vitals:   07/02/19 0956  BP: 125/63  Pulse: 78  Resp: 17  Temp: 98.8 F (37.1 C)  SpO2: 100%   Filed Weights   07/02/19 0956  Weight: 134 lb 9.6 oz (61.1 kg)    Due to COVID19 we will limit examination to appearance. Patient had no complaints.  GENERAL:alert, no distress and comfortable SKIN: skin color normal, no rashes or significant lesions EYES: normal, Conjunctiva are pink and non-injected, sclera clear  NEURO: alert & oriented x 3 with fluent speech   LABORATORY DATA:  I have reviewed the data as listed CBC Latest Ref Rng & Units 07/02/2019 06/30/2019 04/03/2019  WBC 4.0 - 10.5 K/uL 11.8(H) 7.3 -  Hemoglobin 13.0 - 17.0 g/dL 12.1(L) 12.9(L) 13.6  Hematocrit 39.0 - 52.0 % 37.0(L) 39.8 40.0  Platelets 150 - 400 K/uL 201 223 -     CMP Latest Ref Rng & Units 07/02/2019 04/03/2019 04/03/2019  Glucose 70 - 99 mg/dL 102(H)  96 102(H)  BUN 8 - 23 mg/dL 13 11 10   Creatinine 0.61 - 1.24 mg/dL 0.78 0.70 0.84  Sodium 135 - 145 mmol/L 140 137 137  Potassium 3.5 - 5.1 mmol/L 4.0 3.8 3.8  Chloride 98 - 111 mmol/L 103 102 104  CO2 22 - 32 mmol/L 29 - 23  Calcium 8.9 - 10.3 mg/dL 9.1 - 8.7(L)  Total Protein 6.5 - 8.1 g/dL 6.5 - 6.4(L)  Total Bilirubin 0.3 - 1.2 mg/dL 0.7 - 1.1  Alkaline Phos 38 - 126 U/L 77 - 72  AST 15 - 41 U/L 18 - 23  ALT 0 - 44 U/L 15 - 23      RADIOGRAPHIC  STUDIES: I have personally reviewed the radiological images as listed and agreed with the findings in the report. DG Chest Port 1 View  Result Date: 07/01/2019 CLINICAL DATA:  Post port placement EXAM: PORTABLE CHEST 1 VIEW COMPARISON:  04/03/2019 FINDINGS: New right chest wall port catheter tip overlies the right atrium. No pneumothorax. No new consolidation or edema. No pleural effusion. Stable cardiomediastinal contours. IMPRESSION: Right chest wall port catheter tip overlies right atrium. No pneumothorax. Electronically Signed   By: Macy Mis M.D.   On: 07/01/2019 11:07   DG C-Arm 1-60 Min-No Report  Result Date: 07/01/2019 Fluoroscopy was utilized by the requesting physician.  No radiographic interpretation.     ASSESSMENT & PLAN:  Carlos [name] is a 74 y.o. male with    1. Adenocarcinoma of the rectum, cT4N1M0 stage III -He was recently diagnosed in 05/2019. He was found to have proximal rectal mass on first screening colonoscopy, with associated frequent stool and 10 lbs weight loss.  -06/05/19 CT CAP showed small indeterminate pulmonary nodules, no distant metastasis. This was locally staged T4N1 on MRI. There was omental haziness noted on CT, which was not obvious on MRI.  -To complete staging and better imaging he will proceed with PET scan on 07/13/19.  -Although he is elderly he is in good physical condition with relatively no medical co-morbidities; he would likely tolerate treatment well.  -We discussed the standard of care for locally advanced stage III rectal cancer is total neoadjuvant chemoradiation, including 4 months of FOLFOX followed by chemoRT with Xeloda for 5-6 weeks prior to proceeding with surgery. We plan to restage after 3 months of chemo. He has seen Dr. Marcello Moores. The treatment goal is curative. He agreed.  -I will follow up on his colorectal surgery referral to [facility].  -Will proceed with the start of FOLFOX q2weeks starting today (07/02/19).  -He had PAC placed  on 07/01/19 and completed chemo education class.  -Labs reviewed, CBC and  CMP WNL except WBC 11.8, Hg 12.1. Overall adequate to proceed with Cycle 1 FOLFOX today. I reviewed possible side effects to watch for such as nausea, diarrhea/constipation, cold sensitivity and neuropathy etc. I reviewed antiemetics use. He can use compazine from day 1 and after day 3 he can use Zofran as needed. Chemo consent obtained today  -pt's sisters had many questions. I answered all their questions to their understanding and satisfaction. I encouraged him to take off work if needed. Will fill out his FMLA papers  -F/u in 2 weeks. I encouraged him to contact clinic with any concerns.    2. Mild cognitive impairment -followed by [facility] -He will occasionally forget mild events that one usually would remember.  -He is a reliable historian, independent with ADLs, a/o x4, no signs of obvious impairment.  -  will monitor for neurotoxicities on chemo, especially oxaliplatin.  -I encouraged him to stay active physically and with brain exercises.     PLAN: -Lab reviewed and adequate to proceed with C1 FOLFOX today  -I will F/u on his surgeon consult at [facility] on 07/13/19  -Lab, flush, F/u and Chemo FOLFOX in 2 weeks  -He gave FMLA paperwork to start from last week.     No problem-specific Assessment & Plan notes found for this encounter.   No orders of the defined types were placed in this encounter.  All questions were answered. The patient knows to call the clinic with any problems, questions or concerns. No barriers to learning was detected. The total time spent in the appointment was 40 minutes.     Truitt Merle, MD 07/02/2019   I, Joslyn Devon, am acting as scribe for Truitt Merle, MD.   I have reviewed the above documentation for accuracy and completeness, and I agree with the above.

## 2019-06-30 NOTE — Anesthesia Preprocedure Evaluation (Addendum)
Anesthesia Evaluation  Patient identified by MRN, date of birth, ID band Patient awake    Reviewed: Allergy & Precautions, NPO status , Patient's Chart, lab work & pertinent test results  Airway Mallampati: II  TM Distance: >3 FB Neck ROM: Full    Dental no notable dental hx.    Pulmonary neg pulmonary ROS,    Pulmonary exam normal breath sounds clear to auscultation       Cardiovascular negative cardio ROS Normal cardiovascular exam Rhythm:Regular Rate:Normal     Neuro/Psych negative neurological ROS  negative psych ROS   GI/Hepatic negative GI ROS, Neg liver ROS,   Endo/Other  negative endocrine ROS  Renal/GU negative Renal ROS  negative genitourinary   Musculoskeletal negative musculoskeletal ROS (+)   Abdominal   Peds negative pediatric ROS (+)  Hematology negative hematology ROS (+)   Anesthesia Other Findings   Reproductive/Obstetrics negative OB ROS                            Anesthesia Physical Anesthesia Plan  ASA: II  Anesthesia Plan: General   Post-op Pain Management:    Induction: Intravenous  PONV Risk Score and Plan: 2 and Ondansetron, Dexamethasone and Treatment may vary due to age or medical condition  Airway Management Planned: LMA  Additional Equipment:   Intra-op Plan:   Post-operative Plan: Extubation in OR  Informed Consent: I have reviewed the patients History and Physical, chart, labs and discussed the procedure including the risks, benefits and alternatives for the proposed anesthesia with the patient or authorized representative who has indicated his/her understanding and acceptance.     Dental advisory given  Plan Discussed with: CRNA and Surgeon  Anesthesia Plan Comments:         Anesthesia Quick Evaluation

## 2019-07-01 ENCOUNTER — Encounter (HOSPITAL_COMMUNITY)
Admission: RE | Disposition: A | Discharge status: Home / Self Care | Payer: Self-pay | Source: Home / Self Care | Attending: Surgery

## 2019-07-01 ENCOUNTER — Ambulatory Visit (HOSPITAL_COMMUNITY)
Admission: RE | Admit: 2019-07-01 | Discharge: 2019-07-01 | Disposition: A | Discharge status: Home / Self Care | Payer: 59 | Attending: Surgery | Admitting: Surgery

## 2019-07-01 ENCOUNTER — Ambulatory Visit (HOSPITAL_COMMUNITY): Payer: 59

## 2019-07-01 ENCOUNTER — Encounter (HOSPITAL_COMMUNITY): Payer: Self-pay | Admitting: Surgery

## 2019-07-01 ENCOUNTER — Ambulatory Visit (HOSPITAL_COMMUNITY): Payer: 59 | Admitting: Physician Assistant

## 2019-07-01 ENCOUNTER — Ambulatory Visit (HOSPITAL_COMMUNITY): Payer: 59 | Admitting: Anesthesiology

## 2019-07-01 DIAGNOSIS — Z95828 Presence of other vascular implants and grafts: Secondary | ICD-10-CM

## 2019-07-01 DIAGNOSIS — G3184 Mild cognitive impairment, so stated: Secondary | ICD-10-CM | POA: Diagnosis not present

## 2019-07-01 DIAGNOSIS — C20 Malignant neoplasm of rectum: Secondary | ICD-10-CM | POA: Insufficient documentation

## 2019-07-01 HISTORY — PX: PORTACATH PLACEMENT: SHX2246

## 2019-07-01 IMAGING — DX DG CHEST 1V PORT
1 series · 1 of 1 positions shown · non-contrast
Comparison: [DATE]

CLINICAL DATA: Post port placement

EXAM:
PORTABLE CHEST 1 VIEW

[chest ap]
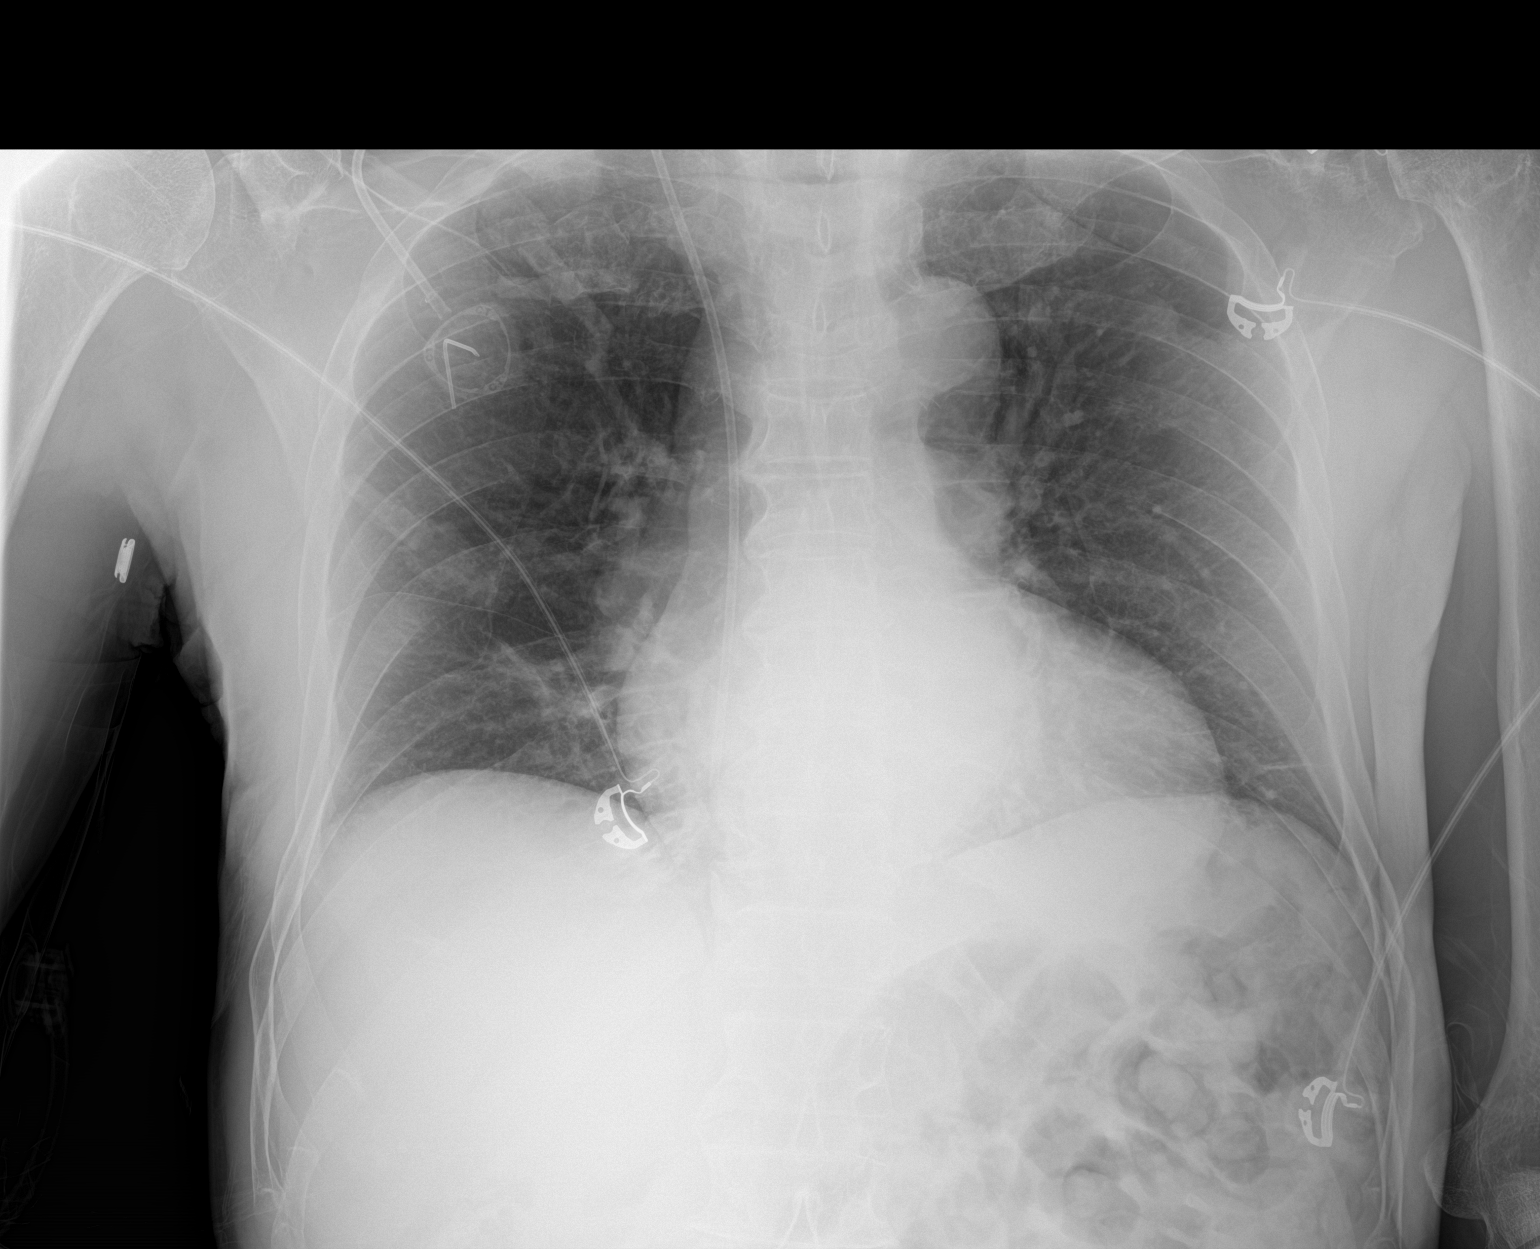

[1 of 1 positions shown; findings below may reference images not displayed]

FINDINGS: New right chest wall port catheter tip overlies the right atrium.

No pneumothorax. No new consolidation or edema. No pleural effusion.
Stable cardiomediastinal contours.
IMPRESSION: Right chest wall port catheter tip overlies right atrium. No
pneumothorax.

## 2019-07-01 SURGERY — INSERTION, TUNNELED CENTRAL VENOUS DEVICE, WITH PORT
Anesthesia: General

## 2019-07-01 MED ORDER — EPHEDRINE SULFATE-NACL 50-0.9 MG/10ML-% IV SOSY
PREFILLED_SYRINGE | INTRAVENOUS | Status: DC | PRN
  Administered 2019-07-01: 5 mg via INTRAVENOUS

## 2019-07-01 MED ORDER — CEFAZOLIN SODIUM-DEXTROSE 2-4 GM/100ML-% IV SOLN
2.0000 g | INTRAVENOUS | Status: AC
  Administered 2019-07-01: 2 g via INTRAVENOUS
  Filled 2019-07-01: qty 100

## 2019-07-01 MED ORDER — PROPOFOL 10 MG/ML IV BOLUS
INTRAVENOUS | Status: AC
  Filled 2019-07-01: qty 20

## 2019-07-01 MED ORDER — HEPARIN SOD (PORK) LOCK FLUSH 100 UNIT/ML IV SOLN
INTRAVENOUS | Status: DC | PRN
  Administered 2019-07-01: 500 [IU] via INTRAVENOUS

## 2019-07-01 MED ORDER — HEPARIN SODIUM (PORCINE) 1000 UNIT/ML IJ SOLN
INTRAMUSCULAR | Status: AC
  Filled 2019-07-01: qty 1

## 2019-07-01 MED ORDER — BUPIVACAINE-EPINEPHRINE (PF) 0.25% -1:200000 IJ SOLN
INTRAMUSCULAR | Status: DC | PRN
  Administered 2019-07-01: 11 mL via PERINEURAL

## 2019-07-01 MED ORDER — OXYCODONE HCL 5 MG PO TABS: 5.0000 mg | ORAL_TABLET | Freq: Once | ORAL | Status: DC | PRN

## 2019-07-01 MED ORDER — OXYCODONE HCL 5 MG/5ML PO SOLN: 5.0000 mg | Freq: Once | ORAL | Status: DC | PRN

## 2019-07-01 MED ORDER — DEXAMETHASONE SODIUM PHOSPHATE 10 MG/ML IJ SOLN
INTRAMUSCULAR | Status: DC | PRN
  Administered 2019-07-01: 10 mg via INTRAVENOUS

## 2019-07-01 MED ORDER — DEXAMETHASONE SODIUM PHOSPHATE 10 MG/ML IJ SOLN
INTRAMUSCULAR | Status: AC
  Filled 2019-07-01: qty 1

## 2019-07-01 MED ORDER — FENTANYL CITRATE (PF) 100 MCG/2ML IJ SOLN: 25.0000 ug | INTRAMUSCULAR | Status: DC | PRN

## 2019-07-01 MED ORDER — LIDOCAINE 2% (20 MG/ML) 5 ML SYRINGE
INTRAMUSCULAR | Status: DC | PRN
  Administered 2019-07-01: 100 mg via INTRAVENOUS

## 2019-07-01 MED ORDER — LIDOCAINE HCL (PF) 1 % IJ SOLN
INTRAMUSCULAR | Status: AC
  Filled 2019-07-01: qty 30

## 2019-07-01 MED ORDER — ORAL CARE MOUTH RINSE: 15.0000 mL | Freq: Once | OROMUCOSAL | Status: AC

## 2019-07-01 MED ORDER — CHLORHEXIDINE GLUCONATE CLOTH 2 % EX PADS: 6.0000 | MEDICATED_PAD | Freq: Once | CUTANEOUS | Status: DC

## 2019-07-01 MED ORDER — FENTANYL CITRATE (PF) 100 MCG/2ML IJ SOLN
INTRAMUSCULAR | Status: AC
  Filled 2019-07-01: qty 2

## 2019-07-01 MED ORDER — ONDANSETRON HCL 4 MG/2ML IJ SOLN
INTRAMUSCULAR | Status: AC
  Filled 2019-07-01: qty 2

## 2019-07-01 MED ORDER — MIDAZOLAM HCL 2 MG/2ML IJ SOLN
INTRAMUSCULAR | Status: AC
  Filled 2019-07-01: qty 2

## 2019-07-01 MED ORDER — LIDOCAINE 2% (20 MG/ML) 5 ML SYRINGE
INTRAMUSCULAR | Status: AC
  Filled 2019-07-01: qty 5

## 2019-07-01 MED ORDER — MIDAZOLAM HCL 2 MG/2ML IJ SOLN
INTRAMUSCULAR | Status: DC | PRN
  Administered 2019-07-01: 1 mg via INTRAVENOUS

## 2019-07-01 MED ORDER — ACETAMINOPHEN 500 MG PO TABS: 1000.0000 mg | ORAL_TABLET | ORAL | Status: DC

## 2019-07-01 MED ORDER — HEPARIN SOD (PORK) LOCK FLUSH 100 UNIT/ML IV SOLN
INTRAVENOUS | Status: AC
  Filled 2019-07-01: qty 5

## 2019-07-01 MED ORDER — BUPIVACAINE-EPINEPHRINE (PF) 0.25% -1:200000 IJ SOLN
INTRAMUSCULAR | Status: AC
  Filled 2019-07-01: qty 30

## 2019-07-01 MED ORDER — ACETAMINOPHEN 500 MG PO TABS
1000.0000 mg | ORAL_TABLET | ORAL | Status: AC
  Administered 2019-07-01: 1000 mg via ORAL
  Filled 2019-07-01: qty 2

## 2019-07-01 MED ORDER — FENTANYL CITRATE (PF) 100 MCG/2ML IJ SOLN
INTRAMUSCULAR | Status: DC | PRN
  Administered 2019-07-01: 50 ug via INTRAVENOUS
  Administered 2019-07-01: 25 ug via INTRAVENOUS

## 2019-07-01 MED ORDER — PROPOFOL 500 MG/50ML IV EMUL
INTRAVENOUS | Status: DC | PRN
  Administered 2019-07-01: 170 mg via INTRAVENOUS
  Administered 2019-07-01: 30 mg via INTRAVENOUS

## 2019-07-01 MED ORDER — CHLORHEXIDINE GLUCONATE 0.12 % MT SOLN
15.0000 mL | Freq: Once | OROMUCOSAL | Status: AC
  Administered 2019-07-01: 15 mL via OROMUCOSAL

## 2019-07-01 MED ORDER — TRAMADOL HCL 50 MG PO TABS: 50.0000 mg | ORAL_TABLET | Freq: Four times a day (QID) | ORAL | 0 refills | Status: DC | PRN

## 2019-07-01 MED ORDER — LACTATED RINGERS IV SOLN: INTRAVENOUS | Status: DC

## 2019-07-01 MED ORDER — CEFAZOLIN SODIUM-DEXTROSE 2-4 GM/100ML-% IV SOLN: 2.0000 g | INTRAVENOUS | Status: DC

## 2019-07-01 MED ORDER — ONDANSETRON HCL 4 MG/2ML IJ SOLN
INTRAMUSCULAR | Status: DC | PRN
  Administered 2019-07-01: 4 mg via INTRAVENOUS

## 2019-07-01 SURGICAL SUPPLY — 36 items
BAG DECANTER FOR FLEXI CONT (MISCELLANEOUS) ×2 IMPLANT
BENZOIN TINCTURE PRP APPL 2/3 (GAUZE/BANDAGES/DRESSINGS) ×2 IMPLANT
BLADE SURG 15 STRL LF DISP TIS (BLADE) ×1 IMPLANT
BLADE SURG 15 STRL SS (BLADE) ×1
CHLORAPREP W/TINT 26 (MISCELLANEOUS) ×2 IMPLANT
CLSR STERI-STRIP ANTIMIC 1/2X4 (GAUZE/BANDAGES/DRESSINGS) ×2 IMPLANT
COVER PROBE U/S 5X48 (MISCELLANEOUS) IMPLANT
COVER SURGICAL LIGHT HANDLE (MISCELLANEOUS) ×2 IMPLANT
COVER WAND RF STERILE (DRAPES) IMPLANT
DECANTER SPIKE VIAL GLASS SM (MISCELLANEOUS) ×2 IMPLANT
DERMABOND ADVANCED (GAUZE/BANDAGES/DRESSINGS)
DERMABOND ADVANCED .7 DNX12 (GAUZE/BANDAGES/DRESSINGS) IMPLANT
DRAPE C-ARM 42X120 X-RAY (DRAPES) ×2 IMPLANT
DRAPE LAPAROSCOPIC ABDOMINAL (DRAPES) ×2 IMPLANT
ELECT REM PT RETURN 15FT ADLT (MISCELLANEOUS) ×2 IMPLANT
GAUZE 4X4 16PLY RFD (DISPOSABLE) ×2 IMPLANT
GAUZE SPONGE 2X2 8PLY STRL LF (GAUZE/BANDAGES/DRESSINGS) IMPLANT
GAUZE SPONGE 4X4 12PLY STRL (GAUZE/BANDAGES/DRESSINGS) ×2 IMPLANT
GLOVE SURG SYN 7.5  E (GLOVE) ×1
GLOVE SURG SYN 7.5 E (GLOVE) ×1 IMPLANT
GOWN STRL REUS W/TWL XL LVL3 (GOWN DISPOSABLE) ×4 IMPLANT
IV CONNECTOR ONE LINK NDLESS (IV SETS) ×4 IMPLANT
KIT BASIN (CUSTOM PROCEDURE TRAY) ×2 IMPLANT
KIT PORT POWER 8FR ISP CVUE (Port) ×2 IMPLANT
KIT TURNOVER KIT A (KITS) IMPLANT
NEEDLE HYPO 25X1 1.5 SAFETY (NEEDLE) ×2 IMPLANT
PACK BASIC VI WITH GOWN DISP (CUSTOM PROCEDURE TRAY) ×2 IMPLANT
PENCIL SMOKE EVACUATOR (MISCELLANEOUS) IMPLANT
SPONGE GAUZE 2X2 STER 10/PKG (GAUZE/BANDAGES/DRESSINGS)
SUT MNCRL AB 4-0 PS2 18 (SUTURE) ×2 IMPLANT
SUT VIC AB 3-0 SH 18 (SUTURE) ×2 IMPLANT
SYR 10ML ECCENTRIC (SYRINGE) ×2 IMPLANT
SYR CONTROL 10ML LL (SYRINGE) ×2 IMPLANT
TAPE CLOTH SURG 4X10 WHT LF (GAUZE/BANDAGES/DRESSINGS) ×2 IMPLANT
TOWEL OR 17X26 10 PK STRL BLUE (TOWEL DISPOSABLE) ×2 IMPLANT
TOWEL OR NON WOVEN STRL DISP B (DISPOSABLE) ×2 IMPLANT

## 2019-07-01 NOTE — Anesthesia Postprocedure Evaluation (Signed)
Anesthesia Post Note  Patient: Carlos [name]  Procedure(s) Performed: PORT ULTRASOUND GUIDED PLACEMENT (N/A )     Patient location during evaluation: PACU Anesthesia Type: General Level of consciousness: awake and alert Pain management: pain level controlled Vital Signs Assessment: post-procedure vital signs reviewed and stable Respiratory status: spontaneous breathing, nonlabored ventilation, respiratory function stable and patient connected to nasal cannula oxygen Cardiovascular status: blood pressure returned to baseline and stable Postop Assessment: no apparent nausea or vomiting Anesthetic complications: no    Last Vitals:  Vitals:   07/01/19 0949 07/01/19 1000  BP: 127/81 133/86  Pulse: 65 64  Resp: 16 12  Temp: (!) 35.7 C (!) 36.1 C  SpO2: 100% 100%    Last Pain:  Vitals:   07/01/19 0949  PainSc: Asleep                 [name]

## 2019-07-01 NOTE — Anesthesia Procedure Notes (Signed)
Procedure Name: LMA Insertion Date/Time: 07/01/2019 8:31 AM Performed by: Gerald Leitz, CRNA Pre-anesthesia Checklist: Patient identified, Patient being monitored, Timeout performed, Emergency Drugs available and Suction available Patient Re-evaluated:Patient Re-evaluated prior to induction Oxygen Delivery Method: Circle system utilized Preoxygenation: Pre-oxygenation with 100% oxygen Induction Type: IV induction Ventilation: Mask ventilation without difficulty LMA: LMA inserted and LMA with gastric port inserted LMA Size: 4.0 Tube type: Oral Number of attempts: 1 Placement Confirmation: positive ETCO2 and breath sounds checked- equal and bilateral Tube secured with: Tape Dental Injury: Teeth and Oropharynx as per pre-operative assessment

## 2019-07-01 NOTE — Transfer of Care (Signed)
Immediate Anesthesia Transfer of Care Note  Patient: Carlos [name]  Procedure(s) Performed: Procedure(s): PORT ULTRASOUND GUIDED PLACEMENT (N/A)  Patient Location: PACU  Anesthesia Type:General  Level of Consciousness: Alert, Awake, Oriented  Airway & Oxygen Therapy: Patient Spontanous Breathing  [name]-op Assessment: Report given to RN  [name] vital signs: Reviewed and stable  Last Vitals: There were no vitals filed for this visit.  Complications: No apparent anesthesia complications

## 2019-07-01 NOTE — Discharge Instructions (Signed)
[facility] - DISCHARGE INSTRUCTIONS TO PATIENT  Activity:  Lifting - no lifting more than 15 pounds for 5 days, then no limit                       Practice your Covid-19 protection:  Wear a mask, social distance, and wash your hands frequently  Wound Care:   Leave the incision dry for 2 days, then you may shower.  The steristrips will come off over 2 or 3 weeks.  Diet:  As tolerated  Follow up appointment:  Call Dr. Manon Hilding office [facility]) at [phone] for an appointment in 6 to 8 weeks..  Medications and dosages:  Resume your home medications.  You have a prescription for:  Ultram  Call Dr. Marcello Moores or his office  [phone]) if you have:  Severe uncontrolled pain,  Redness, tenderness, or signs of infection (pain, swelling, redness, odor or green/yellow discharge around the site),  Difficulty breathing, headache or visual disturbances,  Any other questions or concerns you may have after discharge.  In an emergency, call 911 or go to an Emergency Department at a nearby hospital.

## 2019-07-01 NOTE — Op Note (Signed)
07/01/2019  9:47 AM  PATIENT:  Carlos [name], 73 y.o., male MRN: [id number] DOB: [birth year]  PREOP DIAGNOSIS:  RECTAL CANCER, anticipate chemotherapy  POSTOP DIAGNOSIS:   Rectal cancer, anticipate chemotherapy  PROCEDURE:   Procedure(s):  Right internal jugular PORT, ULTRASOUND GUIDED PLACEMENT  SURGEON:   Alphonsa Overall, M.D.  ANESTHESIA:   general  Anesthesiologist: Myrtie Soman, MD CRNA: Gerald Leitz, CRNA  General  EBL:  minimal  ml  COUNTS CORRECT:  YES  INDICATIONS FOR PROCEDURE:  Carlos [name] is a 74 y.o. (DOB: 1945-06-21) AA male whose primary care physician is [name], MD and comes for power port placement for the treatment of Rectal cancer.  Dr. Burr Medico is his treating oncologist.  Dr. Marcello Moores is the treating surgeon, but is unavailable at this time to place the port.   The indications and risks of the surgery were explained to the patient.  The risks include, but are not limited to, infection, bleeding, pneumothorax, nerve injury, and thrombosis of the vein.  OPERATIVE NOTE:  The patient was taken to OR room #8 at [facility].  Anesthesia was provided by Anesthesiologist: Myrtie Soman, MD CRNA: Gerald Leitz, CRNA.  At the beginning of the operation, the patient was given 2 gm Ancef, had a roll placed under her back, and had the upper chest/neck prepped with Chloroprep and draped.   A time out was held and the surgery checklist reviewed.   The patient was placed in Trendelenburg position.   Using [place], I identified the right internal jugular vein.   The right internal jugular vein was accessed with a 16 gauge needle using the ultrasound and a guide wire threaded through the needle into the vein.  The position of the wire was checked with fluoroscopy.   I then developed a pocket in the upper inner aspect of the right chest for the port reservoir.  I used the [employer] for venous access.  The reservoir was sewn in place with a 3-0 Vicryl suture.   The reservoir had been flushed with dilute (10 units/cc) heparin.   I then passed the silastic tubing from the reservoir incision to a right subclavian incision to the guidewire in the right nect.  I used the 8 [place] introducer to pass it into the vein.  The tip of the silastic catheter was position at the junction of the SVC and the right atrium under fluoroscopy.  The silastic catheter was then attached to the port with the bayonet device.     The entire port and tubing were checked with fluoroscopy and then the port was flushed with 4 cc of concentrated heparin (100 units/cc).  I injected 15 cc of 1/4% marcaine in the wounds.   The wounds were then closed with 3-0 vicryl subcutaneous sutures and the skin closed with a 4-0 Monocryl suture.  The skin was painted with tincture of benzoin and steristripped.   Because he is getting chemotherapy, I left he power port accessed with a right angle huber needle.  I covered the power port with 4 x 4 gauze.   The patient was transferred to the recovery room in good condition.  The sponge and needle count were correct at the end of the case.  A CXR is ordered for port placement and pending at the time of this note.  Alphonsa Overall, MD, [facility] Office phone:  [phone]

## 2019-07-01 NOTE — Interval H&P Note (Signed)
History and Physical Interval Note:  07/01/2019 7:34 AM  Carlos [name]  has presented today for surgery, with the diagnosis of RECTAL CANCER.  The various methods of treatment have been discussed with the patient and family. After consideration of risks, benefits and other options for treatment, the patient has consented to  Procedure(s): PORT ULTRASOUND GUIDED PLACEMENT (N/A) as a surgical intervention.  The patient's history has been reviewed, patient examined, no change in status, stable for surgery.  I have reviewed the patient's chart and labs.  Questions were answered to the patient's satisfaction.     Shann Medal

## 2019-07-02 ENCOUNTER — Other Ambulatory Visit: Payer: Self-pay

## 2019-07-02 ENCOUNTER — Inpatient Hospital Stay: Payer: 59

## 2019-07-02 ENCOUNTER — Encounter: Payer: Self-pay | Admitting: *Deleted

## 2019-07-02 ENCOUNTER — Inpatient Hospital Stay: Payer: 59 | Admitting: Hematology

## 2019-07-02 ENCOUNTER — Other Ambulatory Visit: Payer: Self-pay | Admitting: *Deleted

## 2019-07-02 VITALS — BP 125/63 | HR 78 | Temp 98.8°F | Resp 17 | Ht 67.0 in | Wt 134.6 lb

## 2019-07-02 DIAGNOSIS — C20 Malignant neoplasm of rectum: Secondary | ICD-10-CM

## 2019-07-02 DIAGNOSIS — Z5111 Encounter for antineoplastic chemotherapy: Secondary | ICD-10-CM | POA: Diagnosis not present

## 2019-07-02 LAB — CBC WITH DIFFERENTIAL (CANCER CENTER ONLY)
Abs Immature Granulocytes: 0.06 10*3/uL (ref 0.00–0.07)
Basophils Absolute: 0 10*3/uL (ref 0.0–0.1)
Basophils Relative: 0 %
Eosinophils Absolute: 0.1 10*3/uL (ref 0.0–0.5)
Eosinophils Relative: 0 %
HCT: 37 % — ABNORMAL LOW (ref 39.0–52.0)
Hemoglobin: 12.1 g/dL — ABNORMAL LOW (ref 13.0–17.0)
Immature Granulocytes: 1 %
Lymphocytes Relative: 31 %
Lymphs Abs: 3.7 10*3/uL (ref 0.7–4.0)
MCH: 28.5 pg (ref 26.0–34.0)
MCHC: 32.7 g/dL (ref 30.0–36.0)
MCV: 87.3 fL (ref 80.0–100.0)
Monocytes Absolute: 0.8 10*3/uL (ref 0.1–1.0)
Monocytes Relative: 7 %
Neutro Abs: 7.2 10*3/uL (ref 1.7–7.7)
Neutrophils Relative %: 61 %
Platelet Count: 201 10*3/uL (ref 150–400)
RBC: 4.24 MIL/uL (ref 4.22–5.81)
RDW: 13.2 % (ref 11.5–15.5)
WBC Count: 11.8 10*3/uL — ABNORMAL HIGH (ref 4.0–10.5)
nRBC: 0 % (ref 0.0–0.2)

## 2019-07-02 LAB — CMP (CANCER CENTER ONLY)
ALT: 15 U/L (ref 0–44)
AST: 18 U/L (ref 15–41)
Albumin: 3.6 g/dL (ref 3.5–5.0)
Alkaline Phosphatase: 77 U/L (ref 38–126)
Anion gap: 8 (ref 5–15)
BUN: 13 mg/dL (ref 8–23)
CO2: 29 mmol/L (ref 22–32)
Calcium: 9.1 mg/dL (ref 8.9–10.3)
Chloride: 103 mmol/L (ref 98–111)
Creatinine: 0.78 mg/dL (ref 0.61–1.24)
GFR, Est AFR Am: >60 mL/min (ref >60)
GFR, Estimated: >60 mL/min (ref >60)
Glucose, Bld: 102 mg/dL — ABNORMAL HIGH (ref 70–99)
Potassium: 4 mmol/L (ref 3.5–5.1)
Sodium: 140 mmol/L (ref 135–145)
Total Bilirubin: 0.7 mg/dL (ref 0.3–1.2)
Total Protein: 6.5 g/dL (ref 6.5–8.1)

## 2019-07-02 LAB — RESEARCH LABS

## 2019-07-02 MED ORDER — FLUOROURACIL CHEMO INJECTION 2.5 GM/50ML
400.0000 mg/m2 | Freq: Once | INTRAVENOUS | Status: AC
  Administered 2019-07-02: 650 mg via INTRAVENOUS
  Filled 2019-07-02: qty 13

## 2019-07-02 MED ORDER — LEUCOVORIN CALCIUM INJECTION 350 MG
400.0000 mg/m2 | Freq: Once | INTRAVENOUS | Status: AC
  Administered 2019-07-02: 672 mg via INTRAVENOUS
  Filled 2019-07-02: qty 33.6

## 2019-07-02 MED ORDER — PALONOSETRON HCL INJECTION 0.25 MG/5ML
INTRAVENOUS | Status: AC
  Filled 2019-07-02: qty 5

## 2019-07-02 MED ORDER — SODIUM CHLORIDE 0.9% FLUSH
10.0000 mL | INTRAVENOUS | Status: DC | PRN
  Filled 2019-07-02: qty 10

## 2019-07-02 MED ORDER — OXALIPLATIN CHEMO INJECTION 100 MG/20ML
85.0000 mg/m2 | Freq: Once | INTRAVENOUS | Status: AC
  Administered 2019-07-02: 145 mg via INTRAVENOUS
  Filled 2019-07-02: qty 29

## 2019-07-02 MED ORDER — PALONOSETRON HCL INJECTION 0.25 MG/5ML
0.2500 mg | Freq: Once | INTRAVENOUS | Status: AC
  Administered 2019-07-02: 0.25 mg via INTRAVENOUS

## 2019-07-02 MED ORDER — DEXTROSE 5 % IV SOLN
Freq: Once | INTRAVENOUS | Status: AC
  Filled 2019-07-02: qty 250

## 2019-07-02 MED ORDER — SODIUM CHLORIDE 0.9 % IV SOLN
10.0000 mg | Freq: Once | INTRAVENOUS | Status: AC
  Administered 2019-07-02: 10 mg via INTRAVENOUS
  Filled 2019-07-02: qty 10

## 2019-07-02 MED ORDER — SODIUM CHLORIDE 0.9 % IV SOLN
2400.0000 mg/m2 | INTRAVENOUS | Status: DC
  Administered 2019-07-02: 4050 mg via INTRAVENOUS
  Filled 2019-07-02: qty 81

## 2019-07-02 NOTE — Patient Instructions (Signed)

## 2019-07-02 NOTE — Patient Instructions (Signed)
[facility] Discharge Instructions for Patients Receiving Chemotherapy  Today you received the following chemotherapy agents Oxaliplatin, Leucovorin,  Fluorouracil, 5-FU injection To help prevent nausea and vomiting after your treatment, we encourage you to take your nausea medication as directed.   If you develop nausea and vomiting that is not controlled by your nausea medication, call the clinic.   BELOW ARE SYMPTOMS THAT SHOULD BE REPORTED IMMEDIATELY:  *FEVER GREATER THAN 100.5 F  *CHILLS WITH OR WITHOUT FEVER  NAUSEA AND VOMITING THAT IS NOT CONTROLLED WITH YOUR NAUSEA MEDICATION  *UNUSUAL SHORTNESS OF BREATH  *UNUSUAL BRUISING OR BLEEDING  TENDERNESS IN MOUTH AND THROAT WITH OR WITHOUT PRESENCE OF ULCERS  *URINARY PROBLEMS  *BOWEL PROBLEMS  UNUSUAL RASH Items with * indicate a potential emergency and should be followed up as soon as possible.  Feel free to call the clinic should you have any questions or concerns. The clinic phone number is [phone].  Please show the CHEMO ALERT CARD at check-in to the Emergency Department and triage nurse.   Oxaliplatin Injection What is this medicine? OXALIPLATIN (ox AL i PLA tin) is a chemotherapy drug. It targets fast dividing cells, like cancer cells, and causes these cells to die. This medicine is used to treat cancers of the colon and rectum, and many other cancers. This medicine may be used for other purposes; ask your health care provider or pharmacist if you have questions. COMMON BRAND NAME(S): Eloxatin What should I tell my health care provider before I take this medicine? They need to know if you have any of these conditions:  heart disease  history of irregular heartbeat  liver disease  low blood counts, like white cells, platelets, or red blood cells  lung or breathing disease, like asthma  take medicines that treat or prevent blood clots  tingling of the fingers or toes, or other nerve  disorder  an unusual or allergic reaction to oxaliplatin, other chemotherapy, other medicines, foods, dyes, or preservatives  pregnant or trying to get pregnant  breast-feeding How should I use this medicine? This drug is given as an infusion into a vein. It is administered in a hospital or clinic by a specially trained health care professional. Talk to your pediatrician regarding the use of this medicine in children. Special care may be needed. Overdosage: If you think you have taken too much of this medicine contact a poison control center or emergency room at once. NOTE: This medicine is only for you. Do not share this medicine with others. What if I miss a dose? It is important not to miss a dose. Call your doctor or health care professional if you are unable to keep an appointment. What may interact with this medicine? Do not take this medicine with any of the following medications:  cisapride  dronedarone  pimozide  thioridazine This medicine may also interact with the following medications:  aspirin and aspirin-like medicines  certain medicines that treat or prevent blood clots like warfarin, apixaban, dabigatran, and rivaroxaban  cisplatin  cyclosporine  diuretics  medicines for infection like acyclovir, adefovir, amphotericin B, bacitracin, cidofovir, foscarnet, ganciclovir, gentamicin, pentamidine, vancomycin  NSAIDs, medicines for pain and inflammation, like ibuprofen or naproxen  other medicines that prolong the QT interval (an abnormal heart rhythm)  pamidronate  zoledronic acid This list may not describe all possible interactions. Give your health care provider a list of all the medicines, herbs, non-prescription drugs, or dietary supplements you use. Also tell them if you   smoke, drink alcohol, or use illegal drugs. Some items may interact with your medicine. What should I watch for while using this medicine? Your condition will be monitored carefully  while you are receiving this medicine. You may need blood work done while you are taking this medicine. This medicine may make you feel generally unwell. This is not uncommon as chemotherapy can affect healthy cells as well as cancer cells. Report any side effects. Continue your course of treatment even though you feel ill unless your healthcare professional tells you to stop. This medicine can make you more sensitive to cold. Do not drink cold drinks or use ice. Cover exposed skin before coming in contact with cold temperatures or cold objects. When out in cold weather wear warm clothing and cover your mouth and nose to warm the air that goes into your lungs. Tell your doctor if you get sensitive to the cold. Do not become pregnant while taking this medicine or for 9 months after stopping it. Women should inform their health care professional if they wish to become pregnant or think they might be pregnant. Men should not father a child while taking this medicine and for 6 months after stopping it. There is potential for serious side effects to an unborn child. Talk to your health care professional for more information. Do not breast-feed a child while taking this medicine or for 3 months after stopping it. This medicine has caused ovarian failure in some women. This medicine may make it more difficult to get pregnant. Talk to your health care professional if you are concerned about your fertility. This medicine has caused decreased sperm counts in some men. This may make it more difficult to father a child. Talk to your health care professional if you are concerned about your fertility. This medicine may increase your risk of getting an infection. Call your health care professional for advice if you get a fever, chills, or sore throat, or other symptoms of a cold or flu. Do not treat yourself. Try to avoid being around people who are sick. Avoid taking medicines that contain aspirin, acetaminophen,  ibuprofen, naproxen, or ketoprofen unless instructed by your health care professional. These medicines may hide a fever. Be careful brushing or flossing your teeth or using a toothpick because you may get an infection or bleed more easily. If you have any dental work done, tell your dentist you are receiving this medicine. What side effects may I notice from receiving this medicine? Side effects that you should report to your doctor or health care professional as soon as possible:  allergic reactions like skin rash, itching or hives, swelling of the face, lips, or tongue  breathing problems  cough  low blood counts - this medicine may decrease the number of white blood cells, red blood cells, and platelets. You may be at increased risk for infections and bleeding  nausea, vomiting  pain, redness, or irritation at site where injected  pain, tingling, numbness in the hands or feet  signs and symptoms of bleeding such as bloody or black, tarry stools; red or dark brown urine; spitting up blood or brown material that looks like coffee grounds; red spots on the skin; unusual bruising or bleeding from the eyes, gums, or nose  signs and symptoms of a dangerous change in heartbeat or heart rhythm like chest pain; dizziness; fast, irregular heartbeat; palpitations; feeling faint or lightheaded; falls  signs and symptoms of infection like fever; chills; cough; sore throat; pain or trouble  passing urine  signs and symptoms of liver injury like dark yellow or brown urine; general ill feeling or flu-like symptoms; light-colored stools; loss of appetite; nausea; right upper belly pain; unusually weak or tired; yellowing of the eyes or skin  signs and symptoms of low red blood cells or anemia such as unusually weak or tired; feeling faint or lightheaded; falls  signs and symptoms of muscle injury like dark urine; trouble passing urine or change in the amount of urine; unusually weak or tired; muscle  pain; back pain Side effects that usually do not require medical attention (report to your doctor or health care professional if they continue or are bothersome):  changes in taste  diarrhea  gas  hair loss  loss of appetite  mouth sores This list may not describe all possible side effects. Call your doctor for medical advice about side effects. You may report side effects to FDA at 1-800-FDA-1088. Where should I keep my medicine? This drug is given in a hospital or clinic and will not be stored at home. NOTE: This sheet is a summary. It may not cover all possible information. If you have questions about this medicine, talk to your doctor, pharmacist, or health care provider.  2020 [employer] Standard (2018-06-11 12:20:35)  Leucovorin injection What is this medicine? LEUCOVORIN (loo koe VOR in) is used to prevent or treat the harmful effects of some medicines. This medicine is used to treat anemia caused by a low amount of folic acid in the body. It is also used with 5-fluorouracil (5-FU) to treat colon cancer. This medicine may be used for other purposes; ask your health care provider or pharmacist if you have questions. What should I tell my health care provider before I take this medicine? They need to know if you have any of these conditions:  anemia from low levels of vitamin B-12 in the blood  an unusual or allergic reaction to leucovorin, folic acid, other medicines, foods, dyes, or preservatives  pregnant or trying to get pregnant  breast-feeding How should I use this medicine? This medicine is for injection into a muscle or into a vein. It is given by a health care professional in a hospital or clinic setting. Talk to your pediatrician regarding the use of this medicine in children. Special care may be needed. Overdosage: If you think you have taken too much of this medicine contact a poison control center or emergency room at once. NOTE: This medicine is only for  you. Do not share this medicine with others. What if I miss a dose? This does not apply. What may interact with this medicine?  capecitabine  fluorouracil  phenobarbital  phenytoin  primidone  trimethoprim-sulfamethoxazole This list may not describe all possible interactions. Give your health care provider a list of all the medicines, herbs, non-prescription drugs, or dietary supplements you use. Also tell them if you smoke, drink alcohol, or use illegal drugs. Some items may interact with your medicine. What should I watch for while using this medicine? Your condition will be monitored carefully while you are receiving this medicine. This medicine may increase the side effects of 5-fluorouracil, 5-FU. Tell your doctor or health care professional if you have diarrhea or mouth sores that do not get better or that get worse. What side effects may I notice from receiving this medicine? Side effects that you should report to your doctor or health care professional as soon as possible:  allergic reactions like skin rash, itching or hives, swelling  of the face, lips, or tongue °· breathing problems °· fever, infection °· mouth sores °· unusual bleeding or bruising °· unusually weak or tired °Side effects that usually do not require medical attention (report to your doctor or health care professional if they continue or are bothersome): °· constipation or diarrhea °· loss of appetite °· nausea, vomiting °This list may not describe all possible side effects. Call your doctor for medical advice about side effects. You may report side effects to FDA at 1-800-FDA-1088. °Where should I keep my medicine? °This drug is given in a hospital or clinic and will not be stored at home. °NOTE: This sheet is a summary. It may not cover all possible information. If you have questions about this medicine, talk to your doctor, pharmacist, or health care provider. °© 2020 [employer] Standard (2007-07-29  16:50:29) ° °Fluorouracil, 5-FU injection °What is this medicine? °FLUOROURACIL, 5-FU (flure oh YOOR a sil) is a chemotherapy drug. It slows the growth of cancer cells. This medicine is used to treat many types of cancer like breast cancer, colon or rectal cancer, pancreatic cancer, and stomach cancer. °This medicine may be used for other purposes; ask your health care provider or pharmacist if you have questions. °COMMON BRAND NAME(S): Adrucil °What should I tell my health care provider before I take this medicine? °They need to know if you have any of these conditions: °· blood disorders °· dihydropyrimidine dehydrogenase (DPD) deficiency °· infection (especially a virus infection such as chickenpox, cold sores, or herpes) °· kidney disease °· liver disease °· malnourished, poor nutrition °· recent or ongoing radiation therapy °· an unusual or allergic reaction to fluorouracil, other chemotherapy, other medicines, foods, dyes, or preservatives °· pregnant or trying to get pregnant °· breast-feeding °How should I use this medicine? °This drug is given as an infusion or injection into a vein. It is administered in a hospital or clinic by a specially trained health care professional. °Talk to your pediatrician regarding the use of this medicine in children. Special care may be needed. °Overdosage: If you think you have taken too much of this medicine contact a poison control center or emergency room at once. °NOTE: This medicine is only for you. Do not share this medicine with others. °What if I miss a dose? °It is important not to miss your dose. Call your doctor or health care professional if you are unable to keep an appointment. °What may interact with this medicine? °· allopurinol °· cimetidine °· dapsone °· digoxin °· hydroxyurea °· leucovorin °· levamisole °· medicines for seizures like ethotoin, fosphenytoin, phenytoin °· medicines to increase blood counts like filgrastim, pegfilgrastim,  sargramostim °· medicines that treat or prevent blood clots like warfarin, enoxaparin, and dalteparin °· methotrexate °· metronidazole °· pyrimethamine °· some other chemotherapy drugs like busulfan, cisplatin, estramustine, vinblastine °· trimethoprim °· trimetrexate °· vaccines °Talk to your doctor or health care professional before taking any of these medicines: °· acetaminophen °· aspirin °· ibuprofen °· ketoprofen °· naproxen °This list may not describe all possible interactions. Give your health care provider a list of all the medicines, herbs, non-prescription drugs, or dietary supplements you use. Also tell them if you smoke, drink alcohol, or use illegal drugs. Some items may interact with your medicine. °What should I watch for while using this medicine? °Visit your doctor for checks on your progress. This drug may make you feel generally unwell. This is not uncommon, as chemotherapy can affect healthy cells as well as cancer cells.   Report any side effects. Continue your course of treatment even though you feel ill unless your doctor tells you to stop. In some cases, you may be given additional medicines to help with side effects. Follow all directions for their use. Call your doctor or health care professional for advice if you get a fever, chills or sore throat, or other symptoms of a cold or flu. Do not treat yourself. This drug decreases your body's ability to fight infections. Try to avoid being around people who are sick. This medicine may increase your risk to bruise or bleed. Call your doctor or health care professional if you notice any unusual bleeding. Be careful brushing and flossing your teeth or using a toothpick because you may get an infection or bleed more easily. If you have any dental work done, tell your dentist you are receiving this medicine. Avoid taking products that contain aspirin, acetaminophen, ibuprofen, naproxen, or ketoprofen unless instructed by your doctor. These  medicines may hide a fever. Do not become pregnant while taking this medicine. Women should inform their doctor if they wish to become pregnant or think they might be pregnant. There is a potential for serious side effects to an unborn child. Talk to your health care professional or pharmacist for more information. Do not breast-feed an infant while taking this medicine. Men should inform their doctor if they wish to father a child. This medicine may lower sperm counts. Do not treat diarrhea with over the counter products. Contact your doctor if you have diarrhea that lasts more than 2 days or if it is severe and watery. This medicine can make you more sensitive to the sun. Keep out of the sun. If you cannot avoid being in the sun, wear protective clothing and use sunscreen. Do not use sun lamps or tanning beds/booths. What side effects may I notice from receiving this medicine? Side effects that you should report to your doctor or health care professional as soon as possible:  allergic reactions like skin rash, itching or hives, swelling of the face, lips, or tongue  low blood counts - this medicine may decrease the number of white blood cells, red blood cells and platelets. You may be at increased risk for infections and bleeding.  signs of infection - fever or chills, cough, sore throat, pain or difficulty passing urine  signs of decreased platelets or bleeding - bruising, pinpoint red spots on the skin, black, tarry stools, blood in the urine  signs of decreased red blood cells - unusually weak or tired, fainting spells, lightheadedness  breathing problems  changes in vision  chest pain  mouth sores  nausea and vomiting  pain, swelling, redness at site where injected  pain, tingling, numbness in the hands or feet  redness, swelling, or sores on hands or feet  stomach pain  unusual bleeding Side effects that usually do not require medical attention (report to your doctor or  health care professional if they continue or are bothersome):  changes in finger or toe nails  diarrhea  dry or itchy skin  hair loss  headache  loss of appetite  sensitivity of eyes to the light  stomach upset  unusually teary eyes This list may not describe all possible side effects. Call your doctor for medical advice about side effects. You may report side effects to FDA at 1-800-FDA-1088. Where should I keep my medicine? This drug is given in a hospital or clinic and will not be stored at home. NOTE: This sheet  is a summary. It may not cover all possible information. If you have questions about this medicine, talk to your doctor, pharmacist, or health care provider.  2020 [employer] Standard (2007-05-28 13:53:16)         

## 2019-07-03 ENCOUNTER — Other Ambulatory Visit: Payer: Self-pay

## 2019-07-03 ENCOUNTER — Telehealth: Payer: Self-pay | Admitting: *Deleted

## 2019-07-03 ENCOUNTER — Ambulatory Visit: Payer: 59 | Admitting: Podiatry

## 2019-07-03 ENCOUNTER — Encounter (HOSPITAL_BASED_OUTPATIENT_CLINIC_OR_DEPARTMENT_OTHER): Payer: Self-pay | Admitting: *Deleted

## 2019-07-03 ENCOUNTER — Emergency Department (HOSPITAL_BASED_OUTPATIENT_CLINIC_OR_DEPARTMENT_OTHER)
Admission: EM | Admit: 2019-07-03 | Discharge: 2019-07-03 | Disposition: A | Discharge status: Home / Self Care | Payer: 59 | Attending: Emergency Medicine | Admitting: Emergency Medicine

## 2019-07-03 ENCOUNTER — Telehealth: Payer: Self-pay | Admitting: Hematology

## 2019-07-03 DIAGNOSIS — Z79899 Other long term (current) drug therapy: Secondary | ICD-10-CM | POA: Diagnosis not present

## 2019-07-03 DIAGNOSIS — Z041 Encounter for examination and observation following transport accident: Secondary | ICD-10-CM | POA: Diagnosis not present

## 2019-07-03 DIAGNOSIS — Z0000 Encounter for general adult medical examination without abnormal findings: Secondary | ICD-10-CM

## 2019-07-03 LAB — CEA (IN HOUSE-CHCC): CEA (CHCC-In House): 2.24 ng/mL (ref 0.00–5.00)
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
## 2019-07-03 NOTE — Progress Notes (Signed)
GI Location of Tumor / Histology: Rectal Cancer- Adenocarcinoma  Carlos [name] presented for screening colonoscopy.  He has had frequent bowel movements and 10 pounds weight loss.  PET 07/13/2019:   Staging MRI Pelvis 06/12/2019: Rectal adenocarcinoma T stage: T4 Rectal adenocarcinoma N stage: N1 Distance from tumor to the internal anal sphincter is 6.5 cm.  CT CAP 06/05/2019:Cholelithiasis and gallbladder wall thickening with equivocal pericholecystic inflammation. Acute cholecystitis not excluded. If there is clinical suspicion for acute cholecystitis, recommend ultrasound evaluation.  Approximately 5 cm irregular mass within the distal sigmoid colon with possible extension through the wall. No definite abnormal adjacent lymph nodes.  Mild omental haziness-nonspecific. Metastatic disease not excluded.  Indeterminate 3-4 mm RIGHT pulmonary nodules which could represent metastatic disease. These are too small for PET CT evaluation. Consider CT follow-up.  Colonoscopy 05/29/2019: Digital rectal exam was normal, frond like fungating infiltrative non-obstructing large mass in the rectum.  The mass was partially circumferential, measured 5 cm in length.  The colonoscopy also showed 11 sessile polyps in the transverse and ascending colon and hepatic flexure.  Biopsies of Rectum 05/29/2019   Past/Anticipated interventions by surgeon, if any:  Dr. Marcello Moores 06/09/2019 - 74 year old male with a proximal rectal cancer.   -Biopsy showed adenocarcinoma.  CT scan showed no definitive metastatic disease. -We will proceed with MRI of the pelvis to stage his rectal cancer.  If this shows an advanced stage tumor, we will proceed with radiation and chemotherapy prior to surgery.   Past/Anticipated interventions by medical oncology, if any:  NP [facility]/Dr. Burr Medico 06/19/2019 -Work up showed small indeterminate pulmonary nodules, no distant metastasis. This was locally staged T4N1 on MRI. There was omental haziness noted  on CT, which was not obvious on MRI.  -We plan to discuss his case in GI tumor board next week to determine if a PET scan is necessary  -Although he is elderly he is in good physical condition with relatively no medical co-morbidities; he would likely tolerate treatment well.  -We discussed the standard of care for locally advanced stage III rectal cancer is total neoadjuvant chemoradiation, including 4 months of FOLFOX followed by chemoRT with Xeloda for 5-6 weeks prior to proceeding with surgery. We plan to restage after 3 months of chemo. He has seen Dr. Marcello Moores. The treatment goal is curative.    Weight changes, if any: 10 pound weight loss in the last 3-6 months.  Bowel/Bladder complaints, if any: He states his bowels are loose, no diarrhea.  Nausea / Vomiting, if any: No  Pain issues, if any:  No  Any blood per rectum: No    SAFETY ISSUES:  Prior radiation? No  Pacemaker/ICD? No  Possible current pregnancy? n/a  Is the patient on methotrexate? No  Current Complaints/Details: Port Insertion 07/01/2019
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
## 2019-07-03 NOTE — Telephone Encounter (Signed)
Scheduled appt per 5/27 los.  Pt will get an updated appt calendar at their next scheduled appt.

## 2019-07-04 ENCOUNTER — Inpatient Hospital Stay: Payer: 59

## 2019-07-04 ENCOUNTER — Encounter: Payer: Self-pay | Admitting: Hematology

## 2019-07-04 VITALS — BP 133/88 | HR 89 | Temp 97.8°F | Resp 18

## 2019-07-04 DIAGNOSIS — C20 Malignant neoplasm of rectum: Secondary | ICD-10-CM

## 2019-07-04 DIAGNOSIS — Z5111 Encounter for antineoplastic chemotherapy: Secondary | ICD-10-CM | POA: Diagnosis not present

## 2019-07-04 MED ORDER — SODIUM CHLORIDE 0.9% FLUSH
10.0000 mL | INTRAVENOUS | Status: DC | PRN
  Administered 2019-07-04: 10 mL
  Filled 2019-07-04: qty 10

## 2019-07-04 MED ORDER — HEPARIN SOD (PORK) LOCK FLUSH 100 UNIT/ML IV SOLN
500.0000 [IU] | Freq: Once | INTRAVENOUS | Status: AC | PRN
  Administered 2019-07-04: 500 [IU]
  Filled 2019-07-04: qty 5

## 2019-07-04 NOTE — Patient Instructions (Signed)

## 2019-07-05 ENCOUNTER — Other Ambulatory Visit: Payer: Self-pay | Admitting: Hematology

## 2019-07-05 DIAGNOSIS — C20 Malignant neoplasm of rectum: Secondary | ICD-10-CM

## 2019-07-07 ENCOUNTER — Other Ambulatory Visit: Payer: Self-pay

## 2019-07-07 ENCOUNTER — Ambulatory Visit
Admission: RE | Admit: 2019-07-07 | Discharge: 2019-07-07 | Disposition: A | Discharge status: Home / Self Care | Payer: 59 | Source: Ambulatory Visit | Attending: Hematology | Admitting: Hematology

## 2019-07-07 ENCOUNTER — Encounter: Payer: Self-pay | Admitting: Radiation Oncology

## 2019-07-07 ENCOUNTER — Ambulatory Visit
Admission: RE | Admit: 2019-07-07 | Discharge: 2019-07-07 | Disposition: A | Discharge status: Home / Self Care | Payer: 59 | Source: Ambulatory Visit | Attending: Radiation Oncology | Admitting: Radiation Oncology

## 2019-07-07 VITALS — Ht 67.0 in | Wt 134.0 lb

## 2019-07-07 DIAGNOSIS — C20 Malignant neoplasm of rectum: Secondary | ICD-10-CM

## 2019-07-07 NOTE — Progress Notes (Addendum)
Radiation Oncology         [phone] ________________________________  Initial Outpatient Consultation - Conducted via telephone due to current COVID-19 concerns for limiting patient exposure  I spoke with the patient to conduct this consult visit via telephone to spare the patient unnecessary potential exposure in the healthcare setting during the current COVID-19 pandemic. The patient was notified in advance and was offered a [place] meeting to allow for face to face communication but unfortunately reported that they did not have the appropriate resources/technology to support such a visit and instead preferred to proceed with a telephone consult.    Name: Carlos [name]        MRN: [id number]  Date of Service: 07/07/2019 DOB: 07-May-1945  CC:Carlos Pepper, MD  Carlos Pepper, MD     REFERRING PHYSICIAN: [name], MD   DIAGNOSIS: The encounter diagnosis was Rectal adenocarcinoma [facility]).   HISTORY OF PRESENT ILLNESS: Carlos [name] is a 74 y.o. male seen at the request of Dr. Burr Medico for a newly diagnosed rectal cancer. The patient had been noting increasing looseness of stool over approximately 1 years time and presented to GI for further evaluation.  He underwent colonoscopy with Dr. Therisa Doyne on 05/29/2019 which revealed a polyp at the hepatic flexure, and a mass in the proximal rectum.  CT scans revealed some possible chicken changes of the distal colon/upper rectum, consistent with the biopsy findings, a 3 to 4 mm area of pulmonary nodularity was seen in the right lung base as well.  He underwent an MRI on 06/12/2019 which revealed T4N1 disease in the pelvis specifically involving the left lateral pelvic sidewall,  and he is scheduled to undergo PET scan on Monday.  He has met with Dr. Marcello Moores, and given the extent of involvement would proceed surgically with Dr. Morton Stall at [facility], and they are awaiting formal referral.  He met with Dr. Burr Medico, and has been offered total neoadjuvant  chemotherapy.  He is contacted to discuss the options of chemoradiation following total neoadjuvant chemotherapy.    PREVIOUS RADIATION THERAPY: No   PAST MEDICAL HISTORY:  Past Medical History:  Diagnosis Date  . Anxiety   . Benign prostatic hyperplasia 03/30/2013   10/1 IMO update  . Cancer ([place])   . 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 PORTACATH PLACEMENT N/A 07/01/2019   Procedure: PORT ULTRASOUND GUIDED PLACEMENT;  Surgeon: Alphonsa Overall, MD;  Location: [facility];  Service: General;  Laterality: N/A;  . PROSTATE SURGERY  2004  . TONSILECTOMY, ADENOIDECTOMY, BILATERAL MYRINGOTOMY AND TUBES       FAMILY HISTORY:  Family History  Problem Relation Age of Onset  . Osteoporosis Mother   . Dementia Mother   . Diabetes Mother   . Brain cancer Father      SOCIAL HISTORY:  reports that he has never smoked. He has never used smokeless tobacco. He reports that he does not drink alcohol or use drugs.  The patient is single.  He lives in [place].  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   ALLERGIES: Patient has no known allergies.   MEDICATIONS:  Current Outpatient Medications  Medication Sig Dispense Refill  . acetaminophen (TYLENOL) 325 MG tablet Take 650 mg by mouth every 6 (six) hours as needed for moderate pain (pain).     Marland Kitchen  lidocaine-prilocaine (EMLA) cream Apply to affected area once 30 g 3  . Misc Natural Products (FOCUSED MIND PO) Take 1 capsule by mouth daily. Shaklee Mindworks    . Multiple Vitamins-Minerals (MULTIVITAMIN WITH MINERALS) tablet Take 1 tablet by mouth daily. Centrum Silver    . ondansetron (ZOFRAN) 8 MG tablet Take 1 tablet (8 mg total) by mouth 2 (two) times daily as needed for refractory nausea / vomiting. Start on day 3 after chemotherapy. 30 tablet 1  . prochlorperazine  (COMPAZINE) 10 MG tablet Take 1 tablet (10 mg total) by mouth every 6 (six) hours as needed (Nausea or vomiting). 30 tablet 1  . traMADol (ULTRAM) 50 MG tablet Take 1 tablet (50 mg total) by mouth every 6 (six) hours as needed for moderate pain. 12 tablet 0   No current facility-administered medications for this encounter.     REVIEW OF SYSTEMS: On review of systems, the patient reports that he is doing well overall.  He reports that his bowels continue to be loose but he denies seeing any blood in his stools, pelvic pain or abdominal pain.  He denies any pain during passing stool, or increased mucus production.  He has lost approximately 10 pounds in the last 6 months unintentionally.  No other complaints are verbalized.      PHYSICAL EXAM:  Wt Readings from Last 3 Encounters:  07/03/19 134 lb 11.2 oz (61.1 kg)  07/02/19 134 lb 9.6 oz (61.1 kg)  07/01/19 130 lb (59 kg)   Unable to assess given encounter type.   ECOG = 1  0 - Asymptomatic (Fully active, able to carry on all predisease activities without restriction)  1 - Symptomatic but completely ambulatory (Restricted in physically strenuous activity but ambulatory and able to carry out work of a light or sedentary nature. For example, light housework, office work)  2 - Symptomatic, <50% in bed during the day (Ambulatory and capable of all self care but unable to carry out any work activities. Up and about more than 50% of waking hours)  3 - Symptomatic, >50% in bed, but not bedbound (Capable of only limited self-care, confined to bed or chair 50% or more of waking hours)  4 - Bedbound (Completely disabled. Cannot carry on any self-care. Totally confined to bed or chair)  5 - Death   Eustace Pen MM, Creech RH, Tormey DC, et al. [phone]). "Toxicity and response criteria of the [facility]". [place] Oncol. 5 (6): 649-55    LABORATORY DATA:  Lab Results  Component Value Date   WBC 11.8 (H) 07/02/2019   HGB  12.1 (L) 07/02/2019   HCT 37.0 (L) 07/02/2019   MCV 87.3 07/02/2019   PLT 201 07/02/2019   Lab Results  Component Value Date   NA 140 07/02/2019   K 4.0 07/02/2019   CL 103 07/02/2019   CO2 29 07/02/2019   Lab Results  Component Value Date   ALT 15 07/02/2019   AST 18 07/02/2019   ALKPHOS 77 07/02/2019   BILITOT 0.7 07/02/2019      RADIOGRAPHY: DG Ankle Complete Left  Result Date: 06/18/2019 Please see detailed radiograph report in office note.  MR PELVIS WO CONTRAST  Result Date: 06/14/2019 CLINICAL DATA:  Newly diagnosed rectal carcinoma.  Staging. EXAM: MRI PELVIS WITHOUT CONTRAST TECHNIQUE: Multiplanar multisequence MR imaging of the pelvis was performed. No intravenous contrast was administered. Small amount of [place] gel was administered per rectum to optimize tumor evaluation. COMPARISON:  None. FINDINGS:  TUMOR LOCATION Tumor distance from Anal Verge/Skin Surface:  11.4 cm Tumor distance to Internal Anal Sphincter: 6.5 cm TUMOR DESCRIPTION Circumferential Extent: 100% Tumor Length: 8.3 cm T - CATEGORY Extension through Muscularis Propria: Yes>20m=T3d Shortest Distance of any tumor/node from Mesorectal Fascia: 0 mm, along the left lateral and posterior walls (tumor abuts the left lateral pelvic sidewall and sacrum) a Extramural Vascular Invasion/Tumor Thrombus: No Invasion of Anterior Peritoneal Reflection: No Involvement of Adjacent Organs or Pelvic Sidewall: Yes, involving the left lateral pelvic sidewall =T4 Levator Ani Involvement: No N - CATEGORY Mesorectal Lymph Nodes >=562m 2=N1 Extra-mesorectal Lymphadenopathy: No Other:  None. IMPRESSION: Rectal adenocarcinoma T stage: T4 Rectal adenocarcinoma N stage:  N1 Distance from tumor to the internal anal sphincter is 6.5 cm. Electronically Signed   By: JoMarlaine Hind.D.   On: 06/14/2019 18:47   DG Chest Port 1 View  Result Date: 07/01/2019 CLINICAL DATA:  Post port placement EXAM: PORTABLE CHEST 1 VIEW COMPARISON:  04/03/2019  FINDINGS: New right chest wall port catheter tip overlies the right atrium. No pneumothorax. No new consolidation or edema. No pleural effusion. Stable cardiomediastinal contours. IMPRESSION: Right chest wall port catheter tip overlies right atrium. No pneumothorax. Electronically Signed   By: PrMacy Mis.D.   On: 07/01/2019 11:07   DG C-Arm 1-60 Min-No Report  Result Date: 07/01/2019 Fluoroscopy was utilized by the requesting physician.  No radiographic interpretation.       IMPRESSION/PLAN: 1. Stage III, cT4N1M0 adenocarcinoma of the rectum. Dr. MoLisbeth Renshawiscusses the pathology findings and reviews the nature of locally advanced disease of the rectum.Dr. FeBurr Medicoeels he is a good candidate to consider neoadjuvant chemotherapy upfront followed by chemoradiation and then surgery.  [facility] given his extensive disease in the pelvis.  Dr. MoLisbeth Renshawpent time discussing the timing of chemoradiation which would begin approximately 4 months from starting IV chemotherapy.  We will be in touch with he and his sister to coordinate proceeding.  We discussed the risks, benefits, short, and long term effects of radiotherapy, and the patient is interested in proceeding. Dr. MoLisbeth Renshawiscusses the delivery and logistics of radiotherapy and anticipates a course of 5 1/2 weeks of radiotherapy.  Given current concerns for patient exposure during the COVID-19 pandemic, this encounter was conducted via telephone.  The patient has provided two factor identification and has given verbal consent for this type of encounter and has been advised to only accept a meeting of this type in a secure network environment. The time spent during this encounter was 60 minutes including preparation, discussion, and coordination of the patient's care. The attendants for this meeting include LaBlenda NicelyRN, Dr. MoLisbeth RenshawAlCandida Peelingnd his sister  PaDebbora Lacrosse During the encounter,  LaBlenda NicelyRN, Dr. MoLisbeth Renshawand AlHayden Pedroere [facility].  LaWhitney Postas located at home and his sister PaDebbora Lacrosseas also on the call at her home.    The above documentation reflects my direct findings during this shared patient visit. Please see the separate note by Dr. MoLisbeth Renshawn this date for the remainder of the patient's plan of care.    AlCarola RhinePAC

## 2019-07-08 ENCOUNTER — Ambulatory Visit (HOSPITAL_COMMUNITY): Payer: 59

## 2019-07-08 NOTE — Research (Signed)
07/08/2019 Research - [employer] 2018-01 Blood collection visit  Late entry for 07/02/2019  Eligibility was confirmed by Dr. Burr Medico by signing the eligibility verification sheet following review of inclusion/exclusion criteria. Patient has had no further biopsies or contrast administration in the past week. Patient meets all inclusion and none of the exclusion criteria to be enrolled on this study. Samples were collected via port by Jonnie Finner, RN, as per patient preference and wish to avoid additional sticks. All five tubes from kit [id number] were collected. Gift card given to the patient by Farris Has, Research Assistant.  Cindy S. Brigitte Pulse BSN, RN, [facility] 07/08/2019 3:27 PM

## 2019-07-09 ENCOUNTER — Telehealth: Payer: Self-pay | Admitting: *Deleted

## 2019-07-09 NOTE — Telephone Encounter (Signed)
Connected with patient's sister Leeanne Rio.  Forms ready for pick up as noted on tracking sheet.  May pick up 67/2021 or 07/16/2019.   Addendum to correct appointment note for registration staff.

## 2019-07-09 NOTE — Telephone Encounter (Signed)
-----   Message from Jonnie Finner, RN sent at 07/09/2019 10:26 AM EDT ----- Regarding: Elise Benne,  His sister [name] [phone] has called to follow up on his FMLA paperwork.  Are you able to call her and let her know when they have been completed/faxed/mailed.  Thank you Malachy Mood

## 2019-07-10 ENCOUNTER — Telehealth: Payer: Self-pay | Admitting: *Deleted

## 2019-07-10 NOTE — Telephone Encounter (Signed)
Faxed medical records to [facility] @ fax [phone] Release [phone]

## 2019-07-13 ENCOUNTER — Encounter (HOSPITAL_COMMUNITY)
Admission: RE | Admit: 2019-07-13 | Discharge: 2019-07-13 | Disposition: A | Discharge status: Home / Self Care | Payer: 59 | Source: Ambulatory Visit | Attending: Hematology | Admitting: Hematology

## 2019-07-13 ENCOUNTER — Other Ambulatory Visit: Payer: Self-pay

## 2019-07-13 DIAGNOSIS — C20 Malignant neoplasm of rectum: Secondary | ICD-10-CM | POA: Diagnosis present

## 2019-07-13 DIAGNOSIS — Q991 46, XX true hermaphrodite: Secondary | ICD-10-CM | POA: Diagnosis not present

## 2019-07-13 LAB — GLUCOSE, CAPILLARY: Glucose-Capillary: 94 mg/dL (ref 70–99)

## 2019-07-13 IMAGING — CT NM PET TUM IMG INITIAL (PI) SKULL BASE T - THIGH
1 of 8 series · 1 of 25 positions shown · non-contrast
Comparison: Pelvic MRI [DATE]

CLINICAL DATA: Initial treatment strategy for rectal
adenocarcinoma.

EXAM:
NUCLEAR MEDICINE PET SKULL BASE TO THIGH
TECHNIQUE: 6.67 mCi F-18 FDG was injected intravenously. Full-ring PET imaging
was performed from the skull base to thigh after the radiotracer. CT
data was obtained and used for attenuation correction and anatomic
localization.
Fasting blood glucose: 94 mg/dl

[Series 4: ct sk_thigh 5.0 b31f · axial · 5.0mm · 0.98mm/px · 1 of 194 slices shown]
[im 194/194  brain]
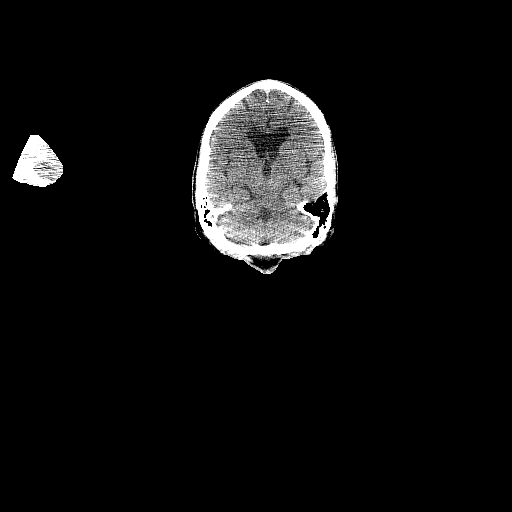

[1 of 25 positions shown; findings below may reference images not displayed]

FINDINGS: Mediastinal blood pool activity: SUV max

Liver activity: SUV max NA

NECK: No hypermetabolic lymph nodes in the neck.

Incidental CT findings: none

CHEST: No hypermetabolic mediastinal or hilar nodes. No suspicious
pulmonary nodules on the CT scan.

Incidental CT findings: Aortic atherosclerosis. Lad coronary artery
calcifications.

ABDOMEN/PELVIS: No abnormal hypermetabolic activity within the
liver, pancreas, adrenal glands, or spleen. No hypermetabolic lymph
nodes in the abdomen or pelvis. Known rectal mass measures 4.7 x
cm on the axial images and has an SUV max of 16.3, image 138/4.

Incidental CT findings: Stones measuring up to 2.2 cm are again
noted. Hyperdense left kidney lesion without increased FDG uptake
measures 2.2 cm and favored to represent hemorrhagic cyst.

SKELETON: No focal hypermetabolic activity to suggest skeletal
metastasis.

Incidental CT findings: Spondylosis noted within the lumbar spine.
IMPRESSION: 1. Known rectal mass is intensely hypermetabolic compatible with
primary rectal adenocarcinoma. No findings of FDG avid nodal
metastasis or distant metastatic disease.
2. Hyperdense kidney cysts are identified bilaterally favored to
represent hemorrhagic cyst.
3. Nonobstructing left renal calculi.
4. Aortic atherosclerosis, in addition to lad coronary artery
disease. Please note that although the presence of coronary artery
calcium documents the presence of coronary artery disease, the
severity of this disease and any potential stenosis cannot be
assessed on this non-gated CT examination. Assessment for potential
risk factor modification, dietary therapy or pharmacologic therapy
may be warranted, if clinically indicated. Aortic Atherosclerosis
([S3]-[S3]).
5. Gallstones.

## 2019-07-13 MED ORDER — FLUDEOXYGLUCOSE F - 18 (FDG) INJECTION
6.6700 | Freq: Once | INTRAVENOUS | Status: AC | PRN
  Administered 2019-07-13: 6.67 via INTRAVENOUS

## 2019-07-14 ENCOUNTER — Encounter: Payer: Self-pay | Admitting: Radiation Oncology

## 2019-07-14 NOTE — Progress Notes (Signed)
°  Radiation Oncology         [phone]) [phone] ________________________________  Name: Carlos [name] MRN: [id number]  Date of Service: 07/14/2019  DOB: 1945-06-07  To whom it may concern this letter is notifying the reader that [name] is a patient of [facility].  He will be undergoing treatment which will require administration of chemotherapy over multiple infusions over the course of approximately 4 months, following this he will be offered radiation treatment which requires daily visits Monday through Friday for approximately 5-1/2 weeks.  His treatments are intended to be short and he plans to continue to work during this time.  Further clarity about the needs for his work schedule will be clarified both by Dr. Burr Medico and Dr. Lisbeth Renshaw.    Carola Rhine, PAC

## 2019-07-15 NOTE — Progress Notes (Signed)
[facility]   Telephone:([phone] Fax:([phone]) [phone]   Clinic Follow up Note   Patient Care Team: Carlos Pepper, MD as PCP - General (Family Medicine) Carlos Sprang, MD as Consulting Physician (Neurology) Carlos Finner, RN as Oncology Nurse Navigator Carlos Merle, MD as Consulting Physician (Hematology) Carlos Juniper, MD as Consulting Physician (Gastroenterology) 07/16/2019  CHIEF COMPLAINT: F/u rectal cancer   SUMMARY OF ONCOLOGIC HISTORY: Oncology History  Rectal adenocarcinoma ([facility])  05/29/2019 Procedure   Colonoscopy per Dr. Therisa [name] A digital rectal exam was normal  frond-like fungating infiltrative non-obstructing large mass in the rectum. The mass was partially circumferential, measured 5 cm in length. The colonoscopy also showed 11 sessile polyps in the transverse and asecnding colon and hepatic flexure.    05/29/2019 Initial Biopsy   Pathology on the rectal biopsy showed at least intramucosal adenocarcinoma involving tubulovillous adenoma with high grade dysplasia.   06/05/2019 Imaging   CT CAP IMPRESSION: 1. Cholelithiasis and gallbladder wall thickening with equivocal pericholecystic inflammation. Acute cholecystitis not excluded. If there is clinical suspicion for acute cholecystitis, recommend ultrasound evaluation. 2. Approximately 5 cm irregular mass within the distal sigmoid colon with possible extension through the wall. No definite abnormal adjacent lymph nodes. 3. Mild omental haziness-nonspecific. Metastatic disease not excluded. 4. Indeterminate 3-4 mm RIGHT pulmonary nodules which could represent metastatic disease. These are too small for PET CT evaluation. Consider CT follow-up. 5. 2.5 cm irregular cystic mass within the RIGHT kidney. Elective MRI recommended for further evaluation. 6. 4 mm nonobstructing LEFT LOWER pole renal calculus. 7. Coronary artery disease. 8. Aortic Atherosclerosis (ICD10-I70.0).   06/12/2019 Imaging    Staging MRI pelvis  FINDINGS: TUMOR LOCATION Tumor distance from Anal Verge/Skin Surface:  11.4 cm Tumor distance to Internal Anal Sphincter: 6.5 cm TUMOR DESCRIPTION Circumferential Extent: 100% Tumor Length: 8.3 cm T - CATEGORY Extension through Muscularis Propria: Yes>49m=T3d Shortest Distance of any tumor/node from Mesorectal Fascia: 0 mm, along the left lateral and posterior walls (tumor abuts the left lateral pelvic sidewall and sacrum) Extramural Vascular Invasion/Tumor Thrombus: No Invasion of Anterior Peritoneal Reflection: No Involvement of Adjacent Organs or Pelvic Sidewall: Yes, involving the left lateral pelvic sidewall =T4 Levator Ani Involvement: No N - CATEGORY Mesorectal Lymph Nodes >=547m 2=N1 Extra-mesorectal Lymphadenopathy: No Other:  None. IMPRESSION: Rectal adenocarcinoma T stage: T4 Rectal adenocarcinoma N stage:  N1 Distance from tumor to the internal anal sphincter is 6.5 cm.   06/18/2019 Initial Diagnosis   [place] ([facility]  07/02/2019 -  Chemotherapy   Neoadjuvant FOLFOX q2weeks starting 07/02/19 for 4 months, followed by [facility]-ChemoRT     CURRENT THERAPY: Total neoadjuvant chemo, initial course of FOLFOX beginning 07/02/2019   INTERVAL HISTORY: Carlos [name] for f/u and treatment as scheduled. He completed cycle 1 FOLFOX on 07/02/19. He feels well. Did not noticed any bad effects. Cold sensitivity didn't bother him. No neuropathy. Appetite and energy normal. Still able to work full time at [employer]. Has intermittent loose stools, not today. Denies rectal pain or bleeding. Denies mucositis, n/v/c, fever, chills, cough, chest pain, dyspnea, or other concerns.    MEDICAL HISTORY:  Past Medical History:  Diagnosis Date   Anxiety    Benign prostatic hyperplasia 03/30/2013   10/1 IMO update   Cancer ([facility]   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/23/2019    SURGICAL HISTORY: Past Surgical History:  Procedure  Laterality Date   PORTACATH PLACEMENT N/A 07/01/2019   Procedure: PORT ULTRASOUND GUIDED  PLACEMENT;  Surgeon: Carlos Overall, MD;  Location: [facility];  Service: General;  Laterality: N/A;   PROSTATE SURGERY  2004   TONSILECTOMY, ADENOIDECTOMY, BILATERAL MYRINGOTOMY AND TUBES      I have reviewed the social history and family history with the patient and they are unchanged from previous note.  ALLERGIES:  has No Known Allergies.  MEDICATIONS:  Current Outpatient Medications  Medication Sig Dispense Refill   acetaminophen (TYLENOL) 325 MG tablet Take 650 mg by mouth every 6 (six) hours as needed for moderate pain (pain).      lidocaine-prilocaine (EMLA) cream Apply to affected area once 30 g 3   Misc Natural Products (FOCUSED MIND PO) Take 1 capsule by mouth daily. Shaklee Mindworks     Multiple Vitamins-Minerals (MULTIVITAMIN WITH MINERALS) tablet Take 1 tablet by mouth daily. Centrum Silver     ondansetron (ZOFRAN) 8 MG tablet Take 1 tablet (8 mg total) by mouth 2 (two) times daily as needed for refractory nausea / vomiting. Start on day 3 after chemotherapy. 30 tablet 1   prochlorperazine (COMPAZINE) 10 MG tablet Take 1 tablet (10 mg total) by mouth every 6 (six) hours as needed (Nausea or vomiting). 30 tablet 1   traMADol (ULTRAM) 50 MG tablet Take 1 tablet (50 mg total) by mouth every 6 (six) hours as needed for moderate pain. 12 tablet 0   No current facility-administered medications for this visit.    PHYSICAL EXAMINATION: ECOG PERFORMANCE STATUS: 0 - Asymptomatic  Vitals:   07/16/19 1141  BP: 126/72  Pulse: 73  Resp: 18  Temp: 98.1 F (36.7 C)  SpO2: 100%   Filed Weights   07/16/19 1141  Weight: 135 lb (61.2 kg)    GENERAL:alert, no distress and comfortable SKIN: no obvious rash to exposed skin EYES: sclera clear LUNGS:  normal breathing effort HEART: no lower extremity edema NEURO: alert & oriented x 3 with fluent speech, normal gait PAC without  erythema Exam limited to observation for covid19 precautions and patient had no complaints   LABORATORY DATA:  I have reviewed the data as listed CBC Latest Ref Rng & Units 07/16/2019 07/02/2019 06/30/2019  WBC 4.0 - 10.5 K/uL 5.6 11.8(H) 7.3  Hemoglobin 13.0 - 17.0 g/dL 11.8(L) 12.1(L) 12.9(L)  Hematocrit 39 - 52 % 36.0(L) 37.0(L) 39.8  Platelets 150 - 400 K/uL 190 201 223     CMP Latest Ref Rng & Units 07/16/2019 07/02/2019 04/03/2019  Glucose 70 - 99 mg/dL 108(H) 102(H) 96  BUN 8 - 23 mg/dL 11 13 11   Creatinine 0.61 - 1.24 mg/dL 0.79 0.78 0.70  Sodium 135 - 145 mmol/L 140 140 137  Potassium 3.5 - 5.1 mmol/L 4.0 4.0 3.8  Chloride 98 - 111 mmol/L 108 103 102  CO2 22 - 32 mmol/L 27 29 -  Calcium 8.9 - 10.3 mg/dL 8.9 9.1 -  Total Protein 6.5 - 8.1 g/dL 6.2(L) 6.5 -  Total Bilirubin 0.3 - 1.2 mg/dL 0.5 0.7 -  Alkaline Phos 38 - 126 U/L 77 77 -  AST 15 - 41 U/L 19 18 -  ALT 0 - 44 U/L 17 15 -      RADIOGRAPHIC STUDIES: I have personally reviewed the radiological images as listed and agreed with the findings in the report. No results found.   ASSESSMENT & PLAN: 74 yo male with   1. Adenocarcinoma of the rectum, cT4N1M0 stage III -He was found to have proximal rectal mass on first screening colonoscopy on 05/29/19 by Dr.  Therisa [name], with associated frequent stool and 10 lbs weight loss.  -Work up showed small indeterminate pulmonary nodules, no distant metastasis. This was locally staged T4N1 on MRI. There was omental haziness noted on CT, which was not obvious on MRI and negative on PET from 07/13/19.  -His case was discussed in our GI tumor conference and the consensus is to proceed with total neoadjuvant chemotherapy before surgical resection for his locally advanced disease. He has met with Dr. Lisbeth Renshaw.  -He began the initial course of chemotherapy with FOLFOX on 07/02/19. Tolerated first cycle very well. The plan is to complete 4 months of FOLFOX, then ccRT with sensitizing capecitabine for  5-6 weeks, then eventual surgery.  -Baseline CEA normal 2.24 -plan to restage after approx 3 months of chemo -The treatment goal is curative.  -enrolled in exact science study  2. Mild cognitive impairment -followed by [facility] -He is a reliable historian, independent with ADLs, a/o x4, no signs of obvious impairment -currently no evidence of neurotoxicity from chemo  Disposition:  Carlos [name] appears stable. He completed cycle 1 of FOLFOX. He tolerated well without significant toxicities except intermittent loose stools. No rectal pain or bleeding. CBC and CMP adequate to proceed with cycle 2 today as planned. He will return for follow up before cycle 3 in 2 weeks.   All questions were answered. The patient knows to call the clinic with any problems, questions or concerns. No barriers to learning were detected.     Alla Feeling, NP 07/16/19

## 2019-07-16 ENCOUNTER — Inpatient Hospital Stay: Payer: 59 | Admitting: Nurse Practitioner

## 2019-07-16 ENCOUNTER — Other Ambulatory Visit: Payer: Self-pay

## 2019-07-16 ENCOUNTER — Inpatient Hospital Stay: Payer: 59

## 2019-07-16 ENCOUNTER — Inpatient Hospital Stay: Payer: 59 | Admitting: Nutrition

## 2019-07-16 ENCOUNTER — Encounter: Payer: Self-pay | Admitting: Nurse Practitioner

## 2019-07-16 ENCOUNTER — Inpatient Hospital Stay: Payer: 59 | Attending: Nurse Practitioner

## 2019-07-16 VITALS — BP 126/72 | HR 73 | Temp 98.1°F | Resp 18 | Ht 67.0 in | Wt 135.0 lb

## 2019-07-16 DIAGNOSIS — C20 Malignant neoplasm of rectum: Secondary | ICD-10-CM | POA: Diagnosis not present

## 2019-07-16 DIAGNOSIS — Z5111 Encounter for antineoplastic chemotherapy: Secondary | ICD-10-CM | POA: Insufficient documentation

## 2019-07-16 DIAGNOSIS — Z95828 Presence of other vascular implants and grafts: Secondary | ICD-10-CM

## 2019-07-16 LAB — CBC WITH DIFFERENTIAL (CANCER CENTER ONLY)
Abs Immature Granulocytes: 0.01 10*3/uL (ref 0.00–0.07)
Basophils Absolute: 0 10*3/uL (ref 0.0–0.1)
Basophils Relative: 0 %
Eosinophils Absolute: 0.1 10*3/uL (ref 0.0–0.5)
Eosinophils Relative: 2 %
HCT: 36 % — ABNORMAL LOW (ref 39.0–52.0)
Hemoglobin: 11.8 g/dL — ABNORMAL LOW (ref 13.0–17.0)
Immature Granulocytes: 0 %
Lymphocytes Relative: 29 %
Lymphs Abs: 1.6 10*3/uL (ref 0.7–4.0)
MCH: 28.9 pg (ref 26.0–34.0)
MCHC: 32.8 g/dL (ref 30.0–36.0)
MCV: 88.2 fL (ref 80.0–100.0)
Monocytes Absolute: 0.5 10*3/uL (ref 0.1–1.0)
Monocytes Relative: 9 %
Neutro Abs: 3.4 10*3/uL (ref 1.7–7.7)
Neutrophils Relative %: 60 %
Platelet Count: 190 10*3/uL (ref 150–400)
RBC: 4.08 MIL/uL — ABNORMAL LOW (ref 4.22–5.81)
RDW: 13.4 % (ref 11.5–15.5)
WBC Count: 5.6 10*3/uL (ref 4.0–10.5)
nRBC: 0 % (ref 0.0–0.2)

## 2019-07-16 LAB — CMP (CANCER CENTER ONLY)
ALT: 17 U/L (ref 0–44)
AST: 19 U/L (ref 15–41)
Albumin: 3.6 g/dL (ref 3.5–5.0)
Alkaline Phosphatase: 77 U/L (ref 38–126)
Anion gap: 5 (ref 5–15)
BUN: 11 mg/dL (ref 8–23)
CO2: 27 mmol/L (ref 22–32)
Calcium: 8.9 mg/dL (ref 8.9–10.3)
Chloride: 108 mmol/L (ref 98–111)
Creatinine: 0.79 mg/dL (ref 0.61–1.24)
GFR, Est AFR Am: >60 mL/min (ref >60)
GFR, Estimated: >60 mL/min (ref >60)
Glucose, Bld: 108 mg/dL — ABNORMAL HIGH (ref 70–99)
Potassium: 4 mmol/L (ref 3.5–5.1)
Sodium: 140 mmol/L (ref 135–145)
Total Bilirubin: 0.5 mg/dL (ref 0.3–1.2)
Total Protein: 6.2 g/dL — ABNORMAL LOW (ref 6.5–8.1)

## 2019-07-16 MED ORDER — SODIUM CHLORIDE 0.9 % IV SOLN
2400.0000 mg/m2 | INTRAVENOUS | Status: DC
  Administered 2019-07-16: 4050 mg via INTRAVENOUS
  Filled 2019-07-16: qty 81

## 2019-07-16 MED ORDER — SODIUM CHLORIDE 0.9 % IV SOLN
10.0000 mg | Freq: Once | INTRAVENOUS | Status: AC
  Administered 2019-07-16: 10 mg via INTRAVENOUS
  Filled 2019-07-16: qty 10

## 2019-07-16 MED ORDER — OXALIPLATIN CHEMO INJECTION 100 MG/20ML
85.0000 mg/m2 | Freq: Once | INTRAVENOUS | Status: AC
  Administered 2019-07-16: 145 mg via INTRAVENOUS
  Filled 2019-07-16: qty 20

## 2019-07-16 MED ORDER — SODIUM CHLORIDE 0.9% FLUSH
10.0000 mL | INTRAVENOUS | Status: DC | PRN
  Administered 2019-07-16: 10 mL via INTRAVENOUS
  Filled 2019-07-16: qty 10

## 2019-07-16 MED ORDER — PALONOSETRON HCL INJECTION 0.25 MG/5ML
0.2500 mg | Freq: Once | INTRAVENOUS | Status: AC
  Administered 2019-07-16: 0.25 mg via INTRAVENOUS

## 2019-07-16 MED ORDER — PALONOSETRON HCL INJECTION 0.25 MG/5ML
INTRAVENOUS | Status: AC
  Filled 2019-07-16: qty 5

## 2019-07-16 MED ORDER — LEUCOVORIN CALCIUM INJECTION 350 MG
400.0000 mg/m2 | Freq: Once | INTRAVENOUS | Status: AC
  Administered 2019-07-16: 672 mg via INTRAVENOUS
  Filled 2019-07-16: qty 33.6

## 2019-07-16 MED ORDER — FLUOROURACIL CHEMO INJECTION 2.5 GM/50ML
400.0000 mg/m2 | Freq: Once | INTRAVENOUS | Status: AC
  Administered 2019-07-16: 650 mg via INTRAVENOUS
  Filled 2019-07-16: qty 13

## 2019-07-16 MED ORDER — DEXTROSE 5 % IV SOLN
Freq: Once | INTRAVENOUS | Status: AC
  Filled 2019-07-16: qty 250

## 2019-07-16 MED ORDER — HEPARIN SOD (PORK) LOCK FLUSH 100 UNIT/ML IV SOLN
500.0000 [IU] | Freq: Once | INTRAVENOUS | Status: DC | PRN
  Filled 2019-07-16: qty 5

## 2019-07-16 MED ORDER — SODIUM CHLORIDE 0.9% FLUSH
10.0000 mL | INTRAVENOUS | Status: DC | PRN
  Filled 2019-07-16: qty 10

## 2019-07-16 NOTE — Progress Notes (Signed)
Nutrition follow-up completed with patient during infusion for colorectal cancer. Weight improved slightly and was documented as 135 pounds on June 10, increased from 134 pounds June 1. Patient reports he is eating about the same. He reports he forgets to drink Ensure and asked me to write out instructions on a piece of paper for him. Denies other nutrition impact symptoms.  Nutrition diagnosis: Food and nutrition related knowledge deficit continues.  Intervention: Educated patient to continue small frequent meals and snacks. Recommended Ensure Plus or boost plus twice daily between meals.  Provided written information. Provided contact information for patient to contact me with questions or concerns.  Next visit: Thursday, July 8 during infusion.  **Disclaimer: This note was dictated with voice recognition software. Similar sounding words can inadvertently be transcribed and this note may contain transcription errors which may not have been corrected upon publication of note.**

## 2019-07-16 NOTE — Progress Notes (Signed)
Blood return noted before, during, and after 5FU IV push injection.

## 2019-07-16 NOTE — Patient Instructions (Addendum)
[facility] Discharge Instructions for Patients Receiving Chemotherapy  Today you received the following chemotherapy agents: Oxaliplatin, Leucovorin, and Fluorouracil  To help prevent nausea and vomiting after your treatment, we encourage you to take your nausea medication as directed by your MD.   If you develop nausea and vomiting that is not controlled by your nausea medication, call the clinic.   BELOW ARE SYMPTOMS THAT SHOULD BE REPORTED IMMEDIATELY:  *FEVER GREATER THAN 100.5 F  *CHILLS WITH OR WITHOUT FEVER  NAUSEA AND VOMITING THAT IS NOT CONTROLLED WITH YOUR NAUSEA MEDICATION  *UNUSUAL SHORTNESS OF BREATH  *UNUSUAL BRUISING OR BLEEDING  TENDERNESS IN MOUTH AND THROAT WITH OR WITHOUT PRESENCE OF ULCERS  *URINARY PROBLEMS  *BOWEL PROBLEMS  UNUSUAL RASH Items with * indicate a potential emergency and should be followed up as soon as possible.  Feel free to call the clinic should you have any questions or concerns. The clinic phone number is [phone].  Please show the [facility] at check-in to the Emergency Department and triage nurse.   [place] Discharge Instructions for Patients receiving Home Portable Chemo Pump    **The bag should finish at 46 hours, 96 hours or 7 days. For example, if your pump is scheduled for 46 hours and it was put on at 4pm, it should finish at 2 pm the day it is scheduled to come off regardless of your appointment time.    Estimated time to finish   _________________________ (Have your nurse fill in)     ** if the display on your pump reads "Low Volume" and it is beeping, take the batteries out of the pump and come to the cancer center for it to be taken off.   **If the pump alarms go off prior to the pump reading "Low Volume" then call the [phone] and someone can assist you.  **If the plunger comes out and the bag fluid is running out, please use your chemo spill kit to clean up the  spill. Do not use paper towels or other house hold products.  ** If you have problems or questions regarding your pump, please call either the [phone] or the cancer center Monday-Friday 8:00am-4:30pm at [phone] and we will assist you.  If you are unable to get assistance then go to [facility] Emergency Room, ask the staff to contact the IV team for assistance.

## 2019-07-18 ENCOUNTER — Inpatient Hospital Stay: Payer: 59

## 2019-07-18 ENCOUNTER — Other Ambulatory Visit: Payer: Self-pay

## 2019-07-18 VITALS — BP 126/69 | HR 78 | Temp 97.6°F | Resp 18

## 2019-07-18 DIAGNOSIS — C20 Malignant neoplasm of rectum: Secondary | ICD-10-CM

## 2019-07-18 DIAGNOSIS — Z5111 Encounter for antineoplastic chemotherapy: Secondary | ICD-10-CM | POA: Diagnosis not present

## 2019-07-18 MED ORDER — HEPARIN SOD (PORK) LOCK FLUSH 100 UNIT/ML IV SOLN
500.0000 [IU] | Freq: Once | INTRAVENOUS | Status: AC | PRN
  Administered 2019-07-18: 500 [IU]
  Filled 2019-07-18: qty 5

## 2019-07-18 MED ORDER — SODIUM CHLORIDE 0.9% FLUSH
10.0000 mL | INTRAVENOUS | Status: DC | PRN
  Administered 2019-07-18: 10 mL
  Filled 2019-07-18: qty 10

## 2019-07-18 NOTE — Patient Instructions (Signed)
Tunneled Central Venous Catheter Flushing Guide  It is important to flush your tunneled central venous catheter each time you use it, both before and after you use it. Flushing your catheter will help prevent it from clogging. What are the risks? Risks may include:  Infection.  Air getting into the catheter and bloodstream. Supplies needed:  A clean pair of gloves.  A disinfecting wipe. Use an alcohol wipe, chlorhexidine wipe, or iodine wipe as told by your health care provider.  A 10 mL syringe that has been prefilled with saline solution.  An empty 10 mL syringe, if a substance called heparin was injected into your catheter. How to flush your catheter When you flush your catheter, make sure you follow any specific instructions from your health care provider or the manufacturer. These are general guidelines. Flushing your catheter before use If there is heparin in your catheter: 1. Wash your hands with soap and water. 2. Put on gloves. 3. Scrub the injection cap for a minimum of 15 seconds with a disinfecting wipe. 4. Unclamp the catheter. 5. Attach the empty syringe to the injection cap. 6. Pull the syringe plunger back and withdraw 10 mL of blood. 7. Place the syringe into an appropriate waste container. 8. Scrub the injection cap for 15 seconds with a disinfecting wipe. 9. Attach the prefilled syringe to the injection cap. 10. Flush the catheter by pushing the plunger forward until all the liquid from the syringe is in the catheter. 11. Remove the syringe from the injection cap. 12. Clamp the catheter. If there is no heparin in your catheter: 1. Wash your hands with soap and water. 2. Put on gloves. 3. Scrub the injection cap for 15 seconds with a disinfecting wipe. 4. Unclamp the catheter. 5. Attach the prefilled syringe to the injection cap. 6. Flush the catheter by pushing the plunger forward until 5 mL of the liquid from the syringe is in the catheter. 7. Pull back on  the syringe until you see blood in the catheter. 8. If you have been asked to collect any blood, follow your health care provider's instructions. Otherwise, flush the catheter with the rest of the solution from the syringe. 9. Remove the syringe from the injection cap. 10. Clamp the catheter.  Flushing your catheter after use 1. Wash your hands with soap and water. 2. Put on gloves. 3. Scrub the injection cap for 15 seconds with a disinfecting wipe. 4. Unclamp the catheter. 5. Attach the prefilled syringe to the injection cap. 6. Flush the catheter by pushing the plunger forward until all of the liquid from the syringe is in the catheter. 7. Remove the syringe from the injection cap. 8. Clamp the catheter. Problems and solutions  If blood cannot be completely cleared from the injection cap, you may need to have the injection cap replaced.  If the catheter is difficult to flush, use the pulsing method. The pulsing method involves pushing only a few milliliters of solution into the catheter at a time and pausing between pushes.  If you do not see blood in the catheter when you pull back on the syringe, change your body position, such as by raising your arms above your head. Take a deep breath and cough. Then, pull back on the syringe. If you still do not see blood, flush the catheter with a small amount of solution. Then, change positions again and take a breath or cough. Pull back on the syringe again. If you still do not see   blood, finish flushing the catheter and contact your health care provider. Do not use your catheter until your health care provider says it is okay. General tips  Have someone help you flush your catheter, if possible.  Do not force fluid through your catheter.  Do not use a syringe that is larger or smaller than 10 mL. Using a smaller syringe can make the catheter burst.  Do not use your catheter without flushing it first if it has heparin in it. Contact a health  care provider if:  You cannot see any blood in the catheter when you flush it before using it.  Your catheter is difficult to flush. Get help right away if:  You cannot flush the catheter.  The catheter leaks when you flush it or when there is fluid in it.  There are cracks or breaks in the catheter. Summary  It is important to flush your tunneled central venous catheter each time you use it, both before and after you use it.  Scrub the injection cap for 15 seconds with a disinfecting wipe before and after you flush it.  When you flush your catheter, make sure you follow any specific instructions from your health care provider or the manufacturer.  Get help right away if you cannot flush the catheter. This information is not intended to replace advice given to you by your health care provider. Make sure you discuss any questions you have with your health care provider. Document Revised: 10/17/2018 Document Reviewed: 04/09/2018 Elsevier Patient Education  2020 [employer]  

## 2019-07-22 ENCOUNTER — Ambulatory Visit: Payer: 59 | Admitting: Neurology

## 2019-07-24 ENCOUNTER — Other Ambulatory Visit: Payer: Self-pay | Admitting: Hematology

## 2019-07-29 ENCOUNTER — Telehealth: Payer: Self-pay | Admitting: *Deleted

## 2019-07-29 NOTE — Telephone Encounter (Signed)
Connected with "[employer] sister/POA Carlos [name].  Calling to request copy of his FMLA form and the letter Dr. Burr Medico sent to his employer to reduce work hours from 57 to forty hours weekly.  He lost his wallet and needs another Emergency Cancer ID Card issued when he started treatment.  Advised to contact sister.  This nurse located and provided copy of FMLA yesterday afternoon.  ID card for ER care planning declined.  "He will get one when he returns for treatment."  Letter not requested.  "I have located a copy of the letter; no longer need as originally requested."   Could his future appointments be changed to begin at 10:00 am?  8:00 am is too early.  He can arrive to register at 10:00 am or 9:45 am at the earliest.  He works until 11:30 pm.  It's hard for him forgetting with his [employer]; mild cognitive impairment.  May forget to put EMLA cream on.  He also depends on family for transportation to appointments."     Specialty comment added with request for mid to late morning appointments.   Expressed to make this request of the [facility] scheduler coordinating appointment  times available in every appointment area patient expected to arrive.   Currently denies further questions or needs.

## 2019-07-29 NOTE — Progress Notes (Signed)
[facility] OFFICE PROGRESS NOTE  [name], MD [address] [place] [address]  DIAGNOSIS: F/u rectal cancer   Oncology History  Rectal adenocarcinoma ([facility])  05/29/2019 Procedure   Colonoscopy per Dr. Therisa Doyne A digital rectal exam was normal  frond-like fungating infiltrative non-obstructing large mass in the rectum. The mass was partially circumferential, measured 5 cm in length. The colonoscopy also showed 11 sessile polyps in the transverse and asecnding colon and hepatic flexure.    05/29/2019 Initial Biopsy   Pathology on the rectal biopsy showed at least intramucosal adenocarcinoma involving tubulovillous adenoma with high grade dysplasia.   06/05/2019 Imaging   CT CAP IMPRESSION: 1. Cholelithiasis and gallbladder wall thickening with equivocal pericholecystic inflammation. Acute cholecystitis not excluded. If there is clinical suspicion for acute cholecystitis, recommend ultrasound evaluation. 2. Approximately 5 cm irregular mass within the distal sigmoid colon with possible extension through the wall. No definite abnormal adjacent lymph nodes. 3. Mild omental haziness-nonspecific. Metastatic disease not excluded. 4. Indeterminate 3-4 mm RIGHT pulmonary nodules which could represent metastatic disease. These are too small for PET CT evaluation. Consider CT follow-up. 5. 2.5 cm irregular cystic mass within the RIGHT kidney. Elective MRI recommended for further evaluation. 6. 4 mm nonobstructing LEFT LOWER pole renal calculus. 7. Coronary artery disease. 8. Aortic Atherosclerosis (ICD10-I70.0).   06/12/2019 Imaging   Staging MRI pelvis  FINDINGS: TUMOR LOCATION Tumor distance from Anal Verge/Skin Surface:  11.4 cm Tumor distance to Internal Anal Sphincter: 6.5 cm TUMOR DESCRIPTION Circumferential Extent: 100% Tumor Length: 8.3 cm T - CATEGORY Extension through Muscularis Propria: Yes>45m=T3d Shortest Distance of any tumor/node  from Mesorectal Fascia: 0 mm, along the left lateral and posterior walls (tumor abuts the left lateral pelvic sidewall and sacrum) Extramural Vascular Invasion/Tumor Thrombus: No Invasion of Anterior Peritoneal Reflection: No Involvement of Adjacent Organs or Pelvic Sidewall: Yes, involving the left lateral pelvic sidewall =T4 Levator Ani Involvement: No N - CATEGORY Mesorectal Lymph Nodes >=542m 2=N1 Extra-mesorectal Lymphadenopathy: No Other:  None. IMPRESSION: Rectal adenocarcinoma T stage: T4 Rectal adenocarcinoma N stage:  N1 Distance from tumor to the internal anal sphincter is 6.5 cm.   06/18/2019 Initial Diagnosis   [place] ([facility]  07/02/2019 -  Chemotherapy   Neoadjuvant FOLFOX q2weeks starting 07/02/19 for 4 months, followed by [facility]-ChemoRT     CURRENT THERAPY: Total neoadjuvant chemo, initial course of FOLFOX beginning 07/02/2019. Status post 2 cycles.   INTERVAL HISTORY: Carlos [name] returns to the clinic for a follow up visit accompanied by his brother. The patient is feeling well today without any concerning complaints. The patient continues to tolerate treatment with FOLFOX well without any adverse side effects. Denies any fever, chills, night sweats, or weight loss. He is followed by a member of the nutritionist team due to his weight loss prior to his diagnosis. He says his appetite comes and goes. Denies any chest pain, shortness of breath, cough, or hemoptysis. Denies any nausea, vomiting, diarrhea, or constipation. He reports that he sometimes has "soft" stools but denies watery or loose stools. Denies rectal bleeding. Denies abdominal pain. Denies mucositis. Denies any headache or visual changes. Denies any rashes or skin changes. He is requesting an extra chemotherapy card for his wallet. The patient is here today for evaluation prior to starting cycle # 3   MEDICAL HISTORY: Past Medical History:  Diagnosis Date  . Anxiety   . Benign  prostatic hyperplasia 03/30/2013   10/1 IMO  update  . Cancer ([facility])   . 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/23/2019    ALLERGIES:  has No Known Allergies.  MEDICATIONS:  Current Outpatient Medications  Medication Sig Dispense Refill  . acetaminophen (TYLENOL) 325 MG tablet Take 650 mg by mouth every 6 (six) hours as needed for moderate pain (pain).  (Patient not taking: Reported on 07/30/2019)    . lidocaine-prilocaine (EMLA) cream Apply to affected area once (Patient not taking: Reported on 07/30/2019) 30 g 3  . Misc Natural Products (FOCUSED MIND PO) Take 1 capsule by mouth daily. Shaklee Mindworks (Patient not taking: Reported on 07/30/2019)    . Multiple Vitamins-Minerals (MULTIVITAMIN WITH MINERALS) tablet Take 1 tablet by mouth daily. Centrum Silver (Patient not taking: Reported on 07/30/2019)    . ondansetron (ZOFRAN) 8 MG tablet Take 1 tablet (8 mg total) by mouth 2 (two) times daily as needed for refractory nausea / vomiting. Start on day 3 after chemotherapy. (Patient not taking: Reported on 07/30/2019) 30 tablet 1  . prochlorperazine (COMPAZINE) 10 MG tablet Take 1 tablet (10 mg total) by mouth every 6 (six) hours as needed (Nausea or vomiting). (Patient not taking: Reported on 07/30/2019) 30 tablet 1  . traMADol (ULTRAM) 50 MG tablet Take 1 tablet (50 mg total) by mouth every 6 (six) hours as needed for moderate pain. (Patient not taking: Reported on 07/30/2019) 12 tablet 0   No current facility-administered medications for this visit.   Facility-Administered Medications Ordered in Other Visits  Medication Dose Route Frequency Provider Last Rate Last Admin  . dexamethasone (DECADRON) 10 mg in sodium chloride 0.9 % 50 mL IVPB  10 mg Intravenous Once Truitt Merle, MD      . dextrose 5 % solution   Intravenous Once Truitt Merle, MD      . fluorouracil (ADRUCIL) 4,050 mg in sodium chloride 0.9 % 69 mL chemo infusion  2,400 mg/m2 (Treatment Plan Recorded) Intravenous 1 day or 1 dose  Truitt Merle, MD      . fluorouracil (ADRUCIL) chemo injection 650 mg  400 mg/m2 (Treatment Plan Recorded) Intravenous Once Truitt Merle, MD      . heparin lock flush 100 unit/mL  500 Units Intracatheter Once PRN Truitt Merle, MD      . leucovorin 672 mg in dextrose 5 % 250 mL infusion  400 mg/m2 (Treatment Plan Recorded) Intravenous Once Truitt Merle, MD      . oxaliplatin (ELOXATIN) 145 mg in dextrose 5 % 500 mL chemo infusion  85 mg/m2 (Treatment Plan Recorded) Intravenous Once Truitt Merle, MD      . palonosetron (ALOXI) injection 0.25 mg  0.25 mg Intravenous Once Truitt Merle, MD      . sodium chloride flush (NS) 0.9 % injection 10 mL  10 mL Intracatheter PRN Truitt Merle, MD        SURGICAL HISTORY:  Past Surgical History:  Procedure Laterality Date  . PORTACATH PLACEMENT N/A 07/01/2019   Procedure: PORT ULTRASOUND GUIDED PLACEMENT;  Surgeon: Alphonsa Overall, MD;  Location: [facility];  Service: General;  Laterality: N/A;  . PROSTATE SURGERY  2004  . TONSILECTOMY, ADENOIDECTOMY, BILATERAL MYRINGOTOMY AND TUBES      REVIEW OF SYSTEMS:   Review of Systems  Constitutional: Negative for appetite change, chills, fatigue, fever and unexpected weight change.  HENT: Negative for mouth sores, nosebleeds, sore throat and trouble swallowing.   Eyes: Negative for eye problems and icterus.  Respiratory: Negative for cough, hemoptysis, shortness of breath and wheezing.  Cardiovascular: Negative for chest pain and leg swelling.  Gastrointestinal: Negative for abdominal pain, constipation, diarrhea, nausea and vomiting.  Genitourinary: Negative for bladder incontinence, difficulty urinating, dysuria, frequency and hematuria.   Musculoskeletal: Negative for back pain, gait problem, neck pain and neck stiffness.  Skin: Negative for itching and rash.  Neurological: Negative for dizziness, extremity weakness, gait problem, headaches, light-headedness and seizures.  Hematological: Negative for adenopathy. Does not bruise/bleed  easily.  Psychiatric/Behavioral: Negative for confusion, depression and sleep disturbance. The patient is not nervous/anxious.     PHYSICAL EXAMINATION:  Blood pressure 137/74, pulse 73, temperature 98.1 F (36.7 C), temperature source Temporal, resp. rate 18, height 5' 7" (1.702 m), weight 133 lb 1.6 oz (60.4 kg), SpO2 100 %.  ECOG PERFORMANCE STATUS: 1 - Symptomatic but completely ambulatory  Physical Exam  Constitutional: Oriented to person, place, and time and well-developed, well-nourished, and in no distress.  HENT:  Head: Normocephalic and atraumatic.  Mouth/Throat: Oropharynx is clear and moist. No oropharyngeal exudate.  Eyes: Conjunctivae are normal. Right eye exhibits no discharge. Left eye exhibits no discharge. No scleral icterus.  Neck: Normal range of motion. Neck supple.  Cardiovascular: Normal rate, regular rhythm, normal heart sounds and intact distal pulses.   Pulmonary/Chest: Effort normal and breath sounds normal. No respiratory distress. No wheezes. No rales.  Abdominal: Soft. Bowel sounds are normal. Exhibits no distension and no mass. There is no tenderness.  Musculoskeletal: Normal range of motion. Exhibits no edema.  Lymphadenopathy:    No cervical adenopathy.  Neurological: Alert and oriented to person, place, and time. Exhibits normal muscle tone. Gait normal. Coordination normal.  Skin: Skin is warm and dry. No rash noted. Not diaphoretic. No erythema. No pallor.  Psychiatric: Mood, memory and judgment normal.  Vitals reviewed.  LABORATORY DATA: Lab Results  Component Value Date   WBC 6.1 07/30/2019   HGB 12.4 (L) 07/30/2019   HCT 38.6 (L) 07/30/2019   MCV 87.7 07/30/2019   PLT 181 07/30/2019      Chemistry      Component Value Date/Time   NA 139 07/30/2019 0820   K 3.7 07/30/2019 0820   CL 105 07/30/2019 0820   CO2 24 07/30/2019 0820   BUN 14 07/30/2019 0820   CREATININE 0.88 07/30/2019 0820      Component Value Date/Time   CALCIUM 9.0  07/30/2019 0820   ALKPHOS 78 07/30/2019 0820   AST 24 07/30/2019 0820   ALT 17 07/30/2019 0820   BILITOT 0.5 07/30/2019 0820       RADIOGRAPHIC STUDIES:  NM PET Image Initial (PI) Skull Base To Thigh  Result Date: 07/13/2019 CLINICAL DATA:  Initial treatment strategy for rectal adenocarcinoma. EXAM: NUCLEAR MEDICINE PET SKULL BASE TO THIGH TECHNIQUE: 6.67 mCi F-18 FDG was injected intravenously. Full-ring PET imaging was performed from the skull base to thigh after the radiotracer. CT data was obtained and used for attenuation correction and anatomic localization. Fasting blood glucose: 94 mg/dl COMPARISON:  Pelvic MRI 06/12/2019 FINDINGS: Mediastinal blood pool activity: SUV max 2.23 Liver activity: SUV max NA NECK: No hypermetabolic lymph nodes in the neck. Incidental CT findings: none CHEST: No hypermetabolic mediastinal or hilar nodes. No suspicious pulmonary nodules on the CT scan. Incidental CT findings: Aortic atherosclerosis. Lad coronary artery calcifications. ABDOMEN/PELVIS: No abnormal hypermetabolic activity within the liver, pancreas, adrenal glands, or spleen. No hypermetabolic lymph nodes in the abdomen or pelvis. Known rectal mass measures 4.7 x 3.2 cm on the axial images and has an SUV  max of 16.3, image 138/4. Incidental CT findings: Stones measuring up to 2.2 cm are again noted. Hyperdense left kidney lesion without increased FDG uptake measures 2.2 cm and favored to represent hemorrhagic cyst. SKELETON: No focal hypermetabolic activity to suggest skeletal metastasis. Incidental CT findings: Spondylosis noted within the lumbar spine. IMPRESSION: 1. Known rectal mass is intensely hypermetabolic compatible with primary rectal adenocarcinoma. No findings of FDG avid nodal metastasis or distant metastatic disease. 2. Hyperdense kidney cysts are identified bilaterally favored to represent hemorrhagic cyst. 3. Nonobstructing left renal calculi. 4. Aortic atherosclerosis, in addition to lad  coronary artery disease. Please note that although the presence of coronary artery calcium documents the presence of coronary artery disease, the severity of this disease and any potential stenosis cannot be assessed on this non-gated CT examination. Assessment for potential risk factor modification, dietary therapy or pharmacologic therapy may be warranted, if clinically indicated. Aortic Atherosclerosis (ICD10-I70.0). 5. Gallstones. Electronically Signed   By: Kerby Moors M.D.   On: 07/13/2019 14:52   DG Chest Port 1 View  Result Date: 07/01/2019 CLINICAL DATA:  Post port placement EXAM: PORTABLE CHEST 1 VIEW COMPARISON:  04/03/2019 FINDINGS: New right chest wall port catheter tip overlies the right atrium. No pneumothorax. No new consolidation or edema. No pleural effusion. Stable cardiomediastinal contours. IMPRESSION: Right chest wall port catheter tip overlies right atrium. No pneumothorax. Electronically Signed   By: Macy Mis M.D.   On: 07/01/2019 11:07   DG C-Arm 1-60 Min-No Report  Result Date: 07/01/2019 Fluoroscopy was utilized by the requesting physician.  No radiographic interpretation.      ASSESSMENT & PLAN:  This is a pleasant 74 yo [name] with   1. Adenocarcinoma of the rectum, cT4N1M0 stage III -He was found to have proximal rectal mass on first screening colonoscopy on 05/29/19 by Dr. Therisa Doyne, with associated frequent stool and 10 lbs weight loss.  -Work up showedsmallindeterminate pulmonary nodules, no distant metastasis. This was locally staged T4N1 on MRI. There was omental haziness noted on CT, which was not obvious on MRI and negative on PET from 07/13/19.  -His case was discussed in our GI tumor conference and the consensus is to proceed with total neoadjuvant chemotherapy before surgical resection for his locally advanced disease. He has met with Dr. Lisbeth Renshaw.  -He began the initial course of chemotherapy with FOLFOX on 07/02/19. Tolerated first cycle very well. The  plan is to complete 4 months of FOLFOX, then ccRT with sensitizing capecitabine for 5-6 weeks, then eventual surgery.  -Baseline CEA normal 2.24 -plan to restage after approx 3 months of chemo -The treatment goal is curative.  -enrolled in exact science study -Labs reviewed today. Labs are adequate to proceed with cycle #3 today as scheduled. -CEA pending from today.   -Follow up in 2 weeks before cycle #4.   2. Mild cognitive impairment -followed by [facility] -He is a reliable historian, independent with ADLs, a/o x4, no signs of obvious impairment -currently no evidence of neurotoxicity from chemo  PLAN: -Labs adequate for cycle #3 today -Follow up in 2 weeks for C4 -Chemotherapy education card provided   No orders of the defined types were placed in this encounter.    Nickcole Bralley L Carold Eisner, PA-C 07/30/19

## 2019-07-30 ENCOUNTER — Inpatient Hospital Stay: Payer: 59

## 2019-07-30 ENCOUNTER — Inpatient Hospital Stay: Payer: 59 | Admitting: Physician Assistant

## 2019-07-30 ENCOUNTER — Other Ambulatory Visit: Payer: Self-pay

## 2019-07-30 VITALS — BP 137/74 | HR 73 | Temp 98.1°F | Resp 18 | Ht 67.0 in | Wt 133.1 lb

## 2019-07-30 DIAGNOSIS — Z5111 Encounter for antineoplastic chemotherapy: Secondary | ICD-10-CM | POA: Diagnosis not present

## 2019-07-30 DIAGNOSIS — C20 Malignant neoplasm of rectum: Secondary | ICD-10-CM

## 2019-07-30 DIAGNOSIS — Z95828 Presence of other vascular implants and grafts: Secondary | ICD-10-CM

## 2019-07-30 LAB — CEA (IN HOUSE-CHCC): CEA (CHCC-In House): 1.85 ng/mL (ref 0.00–5.00)

## 2019-07-30 LAB — CMP (CANCER CENTER ONLY)
ALT: 17 U/L (ref 0–44)
AST: 24 U/L (ref 15–41)
Albumin: 3.7 g/dL (ref 3.5–5.0)
Alkaline Phosphatase: 78 U/L (ref 38–126)
Anion gap: 10 (ref 5–15)
BUN: 14 mg/dL (ref 8–23)
CO2: 24 mmol/L (ref 22–32)
Calcium: 9 mg/dL (ref 8.9–10.3)
Chloride: 105 mmol/L (ref 98–111)
Creatinine: 0.88 mg/dL (ref 0.61–1.24)
GFR, Est AFR Am: >60 mL/min (ref >60)
GFR, Estimated: >60 mL/min (ref >60)
Glucose, Bld: 143 mg/dL — ABNORMAL HIGH (ref 70–99)
Potassium: 3.7 mmol/L (ref 3.5–5.1)
Sodium: 139 mmol/L (ref 135–145)
Total Bilirubin: 0.5 mg/dL (ref 0.3–1.2)
Total Protein: 6.6 g/dL (ref 6.5–8.1)

## 2019-07-30 LAB — CBC WITH DIFFERENTIAL (CANCER CENTER ONLY)
Abs Immature Granulocytes: 0.01 10*3/uL (ref 0.00–0.07)
Basophils Absolute: 0 10*3/uL (ref 0.0–0.1)
Basophils Relative: 1 %
Eosinophils Absolute: 0.2 10*3/uL (ref 0.0–0.5)
Eosinophils Relative: 3 %
HCT: 38.6 % — ABNORMAL LOW (ref 39.0–52.0)
Hemoglobin: 12.4 g/dL — ABNORMAL LOW (ref 13.0–17.0)
Immature Granulocytes: 0 %
Lymphocytes Relative: 38 %
Lymphs Abs: 2.3 10*3/uL (ref 0.7–4.0)
MCH: 28.2 pg (ref 26.0–34.0)
MCHC: 32.1 g/dL (ref 30.0–36.0)
MCV: 87.7 fL (ref 80.0–100.0)
Monocytes Absolute: 0.7 10*3/uL (ref 0.1–1.0)
Monocytes Relative: 12 %
Neutro Abs: 2.9 10*3/uL (ref 1.7–7.7)
Neutrophils Relative %: 46 %
Platelet Count: 181 10*3/uL (ref 150–400)
RBC: 4.4 MIL/uL (ref 4.22–5.81)
RDW: 13.7 % (ref 11.5–15.5)
WBC Count: 6.1 10*3/uL (ref 4.0–10.5)
nRBC: 0 % (ref 0.0–0.2)

## 2019-07-30 MED ORDER — FLUOROURACIL CHEMO INJECTION 2.5 GM/50ML
400.0000 mg/m2 | Freq: Once | INTRAVENOUS | Status: AC
  Administered 2019-07-30: 650 mg via INTRAVENOUS
  Filled 2019-07-30: qty 13

## 2019-07-30 MED ORDER — SODIUM CHLORIDE 0.9% FLUSH
10.0000 mL | INTRAVENOUS | Status: DC | PRN
  Administered 2019-07-30: 10 mL via INTRAVENOUS
  Filled 2019-07-30: qty 10

## 2019-07-30 MED ORDER — SODIUM CHLORIDE 0.9% FLUSH
10.0000 mL | INTRAVENOUS | Status: DC | PRN
  Filled 2019-07-30: qty 10

## 2019-07-30 MED ORDER — LEUCOVORIN CALCIUM INJECTION 350 MG
400.0000 mg/m2 | Freq: Once | INTRAVENOUS | Status: AC
  Administered 2019-07-30: 672 mg via INTRAVENOUS
  Filled 2019-07-30: qty 33.6

## 2019-07-30 MED ORDER — SODIUM CHLORIDE 0.9 % IV SOLN
2400.0000 mg/m2 | INTRAVENOUS | Status: DC
  Administered 2019-07-30: 4050 mg via INTRAVENOUS
  Filled 2019-07-30: qty 81

## 2019-07-30 MED ORDER — OXALIPLATIN CHEMO INJECTION 100 MG/20ML
85.0000 mg/m2 | Freq: Once | INTRAVENOUS | Status: AC
  Administered 2019-07-30: 145 mg via INTRAVENOUS
  Filled 2019-07-30: qty 20

## 2019-07-30 MED ORDER — PALONOSETRON HCL INJECTION 0.25 MG/5ML
0.2500 mg | Freq: Once | INTRAVENOUS | Status: AC
  Administered 2019-07-30: 0.25 mg via INTRAVENOUS

## 2019-07-30 MED ORDER — HEPARIN SOD (PORK) LOCK FLUSH 100 UNIT/ML IV SOLN
500.0000 [IU] | Freq: Once | INTRAVENOUS | Status: DC | PRN
  Filled 2019-07-30: qty 5

## 2019-07-30 MED ORDER — SODIUM CHLORIDE 0.9 % IV SOLN
10.0000 mg | Freq: Once | INTRAVENOUS | Status: AC
  Administered 2019-07-30: 10 mg via INTRAVENOUS
  Filled 2019-07-30: qty 10

## 2019-07-30 MED ORDER — PALONOSETRON HCL INJECTION 0.25 MG/5ML
INTRAVENOUS | Status: AC
  Filled 2019-07-30: qty 5

## 2019-07-30 MED ORDER — DEXTROSE 5 % IV SOLN
Freq: Once | INTRAVENOUS | Status: AC
  Filled 2019-07-30: qty 250

## 2019-07-30 NOTE — Patient Instructions (Signed)
[facility] Discharge Instructions for Patients Receiving Chemotherapy  Today you received the following chemotherapy agents Oxaliplatin, Leucovorin,  Fluorouracil, 5-FU injection To help prevent nausea and vomiting after your treatment, we encourage you to take your nausea medication as directed.   If you develop nausea and vomiting that is not controlled by your nausea medication, call the clinic.   BELOW ARE SYMPTOMS THAT SHOULD BE REPORTED IMMEDIATELY:  *FEVER GREATER THAN 100.5 F  *CHILLS WITH OR WITHOUT FEVER  NAUSEA AND VOMITING THAT IS NOT CONTROLLED WITH YOUR NAUSEA MEDICATION  *UNUSUAL SHORTNESS OF BREATH  *UNUSUAL BRUISING OR BLEEDING  TENDERNESS IN MOUTH AND THROAT WITH OR WITHOUT PRESENCE OF ULCERS  *URINARY PROBLEMS  *BOWEL PROBLEMS  UNUSUAL RASH Items with * indicate a potential emergency and should be followed up as soon as possible.  Feel free to call the clinic should you have any questions or concerns. The clinic phone number is [phone].  Please show the [facility] at check-in to the Emergency Department and triage nurse.   Oxaliplatin Injection What is this medicine? OXALIPLATIN (ox AL i PLA tin) is a chemotherapy drug. It targets fast dividing cells, like cancer cells, and causes these cells to die. This medicine is used to treat cancers of the colon and rectum, and many other cancers. This medicine may be used for other purposes; ask your health care provider or pharmacist if you have questions. COMMON BRAND NAME(S): Eloxatin What should I tell my health care provider before I take this medicine? They need to know if you have any of these conditions:  heart disease  history of irregular heartbeat  liver disease  low blood counts, like white cells, platelets, or red blood cells  lung or breathing disease, like asthma  take medicines that treat or prevent blood clots  tingling of the fingers or toes, or other nerve  disorder  an unusual or allergic reaction to oxaliplatin, other chemotherapy, other medicines, foods, dyes, or preservatives  pregnant or trying to get pregnant  breast-feeding How should I use this medicine? This drug is given as an infusion into a vein. It is administered in a hospital or clinic by a specially trained health care professional. Talk to your pediatrician regarding the use of this medicine in children. Special care may be needed. Overdosage: If you think you have taken too much of this medicine contact a poison control center or emergency room at once. NOTE: This medicine is only for you. Do not share this medicine with others. What if I miss a dose? It is important not to miss a dose. Call your doctor or health care professional if you are unable to keep an appointment. What may interact with this medicine? Do not take this medicine with any of the following medications:  cisapride  dronedarone  pimozide  thioridazine This medicine may also interact with the following medications:  aspirin and aspirin-like medicines  certain medicines that treat or prevent blood clots like warfarin, apixaban, dabigatran, and rivaroxaban  cisplatin  cyclosporine  diuretics  medicines for infection like acyclovir, adefovir, amphotericin B, bacitracin, cidofovir, foscarnet, ganciclovir, gentamicin, pentamidine, vancomycin  NSAIDs, medicines for pain and inflammation, like ibuprofen or naproxen  other medicines that prolong the QT interval (an abnormal heart rhythm)  pamidronate  zoledronic acid This list may not describe all possible interactions. Give your health care provider a list of all the medicines, herbs, non-prescription drugs, or dietary supplements you use. Also tell them if you  smoke, drink alcohol, or use illegal drugs. Some items may interact with your medicine. What should I watch for while using this medicine? Your condition will be monitored carefully  while you are receiving this medicine. You may need blood work done while you are taking this medicine. This medicine may make you feel generally unwell. This is not uncommon as chemotherapy can affect healthy cells as well as cancer cells. Report any side effects. Continue your course of treatment even though you feel ill unless your healthcare professional tells you to stop. This medicine can make you more sensitive to cold. Do not drink cold drinks or use ice. Cover exposed skin before coming in contact with cold temperatures or cold objects. When out in cold weather wear warm clothing and cover your mouth and nose to warm the air that goes into your lungs. Tell your doctor if you get sensitive to the cold. Do not become pregnant while taking this medicine or for 9 months after stopping it. Women should inform their health care professional if they wish to become pregnant or think they might be pregnant. Men should not father a child while taking this medicine and for 6 months after stopping it. There is potential for serious side effects to an unborn child. Talk to your health care professional for more information. Do not breast-feed a child while taking this medicine or for 3 months after stopping it. This medicine has caused ovarian failure in some women. This medicine may make it more difficult to get pregnant. Talk to your health care professional if you are concerned about your fertility. This medicine has caused decreased sperm counts in some men. This may make it more difficult to father a child. Talk to your health care professional if you are concerned about your fertility. This medicine may increase your risk of getting an infection. Call your health care professional for advice if you get a fever, chills, or sore throat, or other symptoms of a cold or flu. Do not treat yourself. Try to avoid being around people who are sick. Avoid taking medicines that contain aspirin, acetaminophen,  ibuprofen, naproxen, or ketoprofen unless instructed by your health care professional. These medicines may hide a fever. Be careful brushing or flossing your teeth or using a toothpick because you may get an infection or bleed more easily. If you have any dental work done, tell your dentist you are receiving this medicine. What side effects may I notice from receiving this medicine? Side effects that you should report to your doctor or health care professional as soon as possible:  allergic reactions like skin rash, itching or hives, swelling of the face, lips, or tongue  breathing problems  cough  low blood counts - this medicine may decrease the number of white blood cells, red blood cells, and platelets. You may be at increased risk for infections and bleeding  nausea, vomiting  pain, redness, or irritation at site where injected  pain, tingling, numbness in the hands or feet  signs and symptoms of bleeding such as bloody or black, tarry stools; red or dark brown urine; spitting up blood or brown material that looks like coffee grounds; red spots on the skin; unusual bruising or bleeding from the eyes, gums, or nose  signs and symptoms of a dangerous change in heartbeat or heart rhythm like chest pain; dizziness; fast, irregular heartbeat; palpitations; feeling faint or lightheaded; falls  signs and symptoms of infection like fever; chills; cough; sore throat; pain or trouble  passing urine  signs and symptoms of liver injury like dark yellow or brown urine; general ill feeling or flu-like symptoms; light-colored stools; loss of appetite; nausea; right upper belly pain; unusually weak or tired; yellowing of the eyes or skin  signs and symptoms of low red blood cells or anemia such as unusually weak or tired; feeling faint or lightheaded; falls  signs and symptoms of muscle injury like dark urine; trouble passing urine or change in the amount of urine; unusually weak or tired; muscle  pain; back pain Side effects that usually do not require medical attention (report to your doctor or health care professional if they continue or are bothersome):  changes in taste  diarrhea  gas  hair loss  loss of appetite  mouth sores This list may not describe all possible side effects. Call your doctor for medical advice about side effects. You may report side effects to FDA at 1-800-FDA-1088. Where should I keep my medicine? This drug is given in a hospital or clinic and will not be stored at home. NOTE: This sheet is a summary. It may not cover all possible information. If you have questions about this medicine, talk to your doctor, pharmacist, or health care provider.  2020 [employer] (2018-06-11 12:20:35)  Leucovorin injection What is this medicine? LEUCOVORIN (loo koe VOR in) is used to prevent or treat the harmful effects of some medicines. This medicine is used to treat anemia caused by a low amount of folic acid in the body. It is also used with 5-fluorouracil (5-FU) to treat colon cancer. This medicine may be used for other purposes; ask your health care provider or pharmacist if you have questions. What should I tell my health care provider before I take this medicine? They need to know if you have any of these conditions:  anemia from low levels of vitamin B-12 in the blood  an unusual or allergic reaction to leucovorin, folic acid, other medicines, foods, dyes, or preservatives  pregnant or trying to get pregnant  breast-feeding How should I use this medicine? This medicine is for injection into a muscle or into a vein. It is given by a health care professional in a hospital or clinic setting. Talk to your pediatrician regarding the use of this medicine in children. Special care may be needed. Overdosage: If you think you have taken too much of this medicine contact a poison control center or emergency room at once. NOTE: This medicine is only for  you. Do not share this medicine with others. What if I miss a dose? This does not apply. What may interact with this medicine?  capecitabine  fluorouracil  phenobarbital  phenytoin  primidone  trimethoprim-sulfamethoxazole This list may not describe all possible interactions. Give your health care provider a list of all the medicines, herbs, non-prescription drugs, or dietary supplements you use. Also tell them if you smoke, drink alcohol, or use illegal drugs. Some items may interact with your medicine. What should I watch for while using this medicine? Your condition will be monitored carefully while you are receiving this medicine. This medicine may increase the side effects of 5-fluorouracil, 5-FU. Tell your doctor or health care professional if you have diarrhea or mouth sores that do not get better or that get worse. What side effects may I notice from receiving this medicine? Side effects that you should report to your doctor or health care professional as soon as possible:  allergic reactions like skin rash, itching or hives, swelling  of the face, lips, or tongue °· breathing problems °· fever, infection °· mouth sores °· unusual bleeding or bruising °· unusually weak or tired °Side effects that usually do not require medical attention (report to your doctor or health care professional if they continue or are bothersome): °· constipation or diarrhea °· loss of appetite °· nausea, vomiting °This list may not describe all possible side effects. Call your doctor for medical advice about side effects. You may report side effects to FDA at 1-800-FDA-1088. °Where should I keep my medicine? °This drug is given in a hospital or clinic and will not be stored at home. °NOTE: This sheet is a summary. It may not cover all possible information. If you have questions about this medicine, talk to your doctor, pharmacist, or health care provider. °© 2020 [employer] Standard (2007-07-29  16:50:29) ° °Fluorouracil, 5-FU injection °What is this medicine? °FLUOROURACIL, 5-FU (flure oh YOOR a sil) is a chemotherapy drug. It slows the growth of cancer cells. This medicine is used to treat many types of cancer like breast cancer, colon or rectal cancer, pancreatic cancer, and stomach cancer. °This medicine may be used for other purposes; ask your health care provider or pharmacist if you have questions. °COMMON BRAND NAME(S): Adrucil °What should I tell my health care provider before I take this medicine? °They need to know if you have any of these conditions: °· blood disorders °· dihydropyrimidine dehydrogenase (DPD) deficiency °· infection (especially a virus infection such as chickenpox, cold sores, or herpes) °· kidney disease °· liver disease °· malnourished, poor nutrition °· recent or ongoing radiation therapy °· an unusual or allergic reaction to fluorouracil, other chemotherapy, other medicines, foods, dyes, or preservatives °· pregnant or trying to get pregnant °· breast-feeding °How should I use this medicine? °This drug is given as an infusion or injection into a vein. It is administered in a hospital or clinic by a specially trained health care professional. °Talk to your pediatrician regarding the use of this medicine in children. Special care may be needed. °Overdosage: If you think you have taken too much of this medicine contact a poison control center or emergency room at once. °NOTE: This medicine is only for you. Do not share this medicine with others. °What if I miss a dose? °It is important not to miss your dose. Call your doctor or health care professional if you are unable to keep an appointment. °What may interact with this medicine? °· allopurinol °· cimetidine °· dapsone °· digoxin °· hydroxyurea °· leucovorin °· levamisole °· medicines for seizures like ethotoin, fosphenytoin, phenytoin °· medicines to increase blood counts like filgrastim, pegfilgrastim,  sargramostim °· medicines that treat or prevent blood clots like warfarin, enoxaparin, and dalteparin °· methotrexate °· metronidazole °· pyrimethamine °· some other chemotherapy drugs like busulfan, cisplatin, estramustine, vinblastine °· trimethoprim °· trimetrexate °· vaccines °Talk to your doctor or health care professional before taking any of these medicines: °· acetaminophen °· aspirin °· ibuprofen °· ketoprofen °· naproxen °This list may not describe all possible interactions. Give your health care provider a list of all the medicines, herbs, non-prescription drugs, or dietary supplements you use. Also tell them if you smoke, drink alcohol, or use illegal drugs. Some items may interact with your medicine. °What should I watch for while using this medicine? °Visit your doctor for checks on your progress. This drug may make you feel generally unwell. This is not uncommon, as chemotherapy can affect healthy cells as well as cancer cells.   Report any side effects. Continue your course of treatment even though you feel ill unless your doctor tells you to stop. In some cases, you may be given additional medicines to help with side effects. Follow all directions for their use. Call your doctor or health care professional for advice if you get a fever, chills or sore throat, or other symptoms of a cold or flu. Do not treat yourself. This drug decreases your body's ability to fight infections. Try to avoid being around people who are sick. This medicine may increase your risk to bruise or bleed. Call your doctor or health care professional if you notice any unusual bleeding. Be careful brushing and flossing your teeth or using a toothpick because you may get an infection or bleed more easily. If you have any dental work done, tell your dentist you are receiving this medicine. Avoid taking products that contain aspirin, acetaminophen, ibuprofen, naproxen, or ketoprofen unless instructed by your doctor. These  medicines may hide a fever. Do not become pregnant while taking this medicine. Women should inform their doctor if they wish to become pregnant or think they might be pregnant. There is a potential for serious side effects to an unborn child. Talk to your health care professional or pharmacist for more information. Do not breast-feed an infant while taking this medicine. Men should inform their doctor if they wish to father a child. This medicine may lower sperm counts. Do not treat diarrhea with over the counter products. Contact your doctor if you have diarrhea that lasts more than 2 days or if it is severe and watery. This medicine can make you more sensitive to the sun. Keep out of the sun. If you cannot avoid being in the sun, wear protective clothing and use sunscreen. Do not use sun lamps or tanning beds/booths. What side effects may I notice from receiving this medicine? Side effects that you should report to your doctor or health care professional as soon as possible:  allergic reactions like skin rash, itching or hives, swelling of the face, lips, or tongue  low blood counts - this medicine may decrease the number of white blood cells, red blood cells and platelets. You may be at increased risk for infections and bleeding.  signs of infection - fever or chills, cough, sore throat, pain or difficulty passing urine  signs of decreased platelets or bleeding - bruising, pinpoint red spots on the skin, black, tarry stools, blood in the urine  signs of decreased red blood cells - unusually weak or tired, fainting spells, lightheadedness  breathing problems  changes in vision  chest pain  mouth sores  nausea and vomiting  pain, swelling, redness at site where injected  pain, tingling, numbness in the hands or feet  redness, swelling, or sores on hands or feet  stomach pain  unusual bleeding Side effects that usually do not require medical attention (report to your doctor or  health care professional if they continue or are bothersome):  changes in finger or toe nails  diarrhea  dry or itchy skin  hair loss  headache  loss of appetite  sensitivity of eyes to the light  stomach upset  unusually teary eyes This list may not describe all possible side effects. Call your doctor for medical advice about side effects. You may report side effects to FDA at 1-800-FDA-1088. Where should I keep my medicine? This drug is given in a hospital or clinic and will not be stored at home. NOTE: This sheet  is a summary. It may not cover all possible information. If you have questions about this medicine, talk to your doctor, pharmacist, or health care provider.  2020 [employer] (2007-05-28 13:53:16)

## 2019-08-01 ENCOUNTER — Other Ambulatory Visit: Payer: Self-pay

## 2019-08-01 ENCOUNTER — Inpatient Hospital Stay: Payer: 59

## 2019-08-01 VITALS — BP 140/65 | HR 99 | Temp 98.4°F | Resp 20

## 2019-08-01 DIAGNOSIS — Z5111 Encounter for antineoplastic chemotherapy: Secondary | ICD-10-CM | POA: Diagnosis not present

## 2019-08-01 DIAGNOSIS — C20 Malignant neoplasm of rectum: Secondary | ICD-10-CM

## 2019-08-01 MED ORDER — SODIUM CHLORIDE 0.9% FLUSH
10.0000 mL | INTRAVENOUS | Status: DC | PRN
  Administered 2019-08-01: 10 mL
  Filled 2019-08-01: qty 10

## 2019-08-01 MED ORDER — HEPARIN SOD (PORK) LOCK FLUSH 100 UNIT/ML IV SOLN
500.0000 [IU] | Freq: Once | INTRAVENOUS | Status: AC | PRN
  Administered 2019-08-01: 500 [IU]
  Filled 2019-08-01: qty 5

## 2019-08-06 ENCOUNTER — Telehealth: Payer: Self-pay

## 2019-08-06 NOTE — Progress Notes (Signed)
[place]   Telephone:(336) [phone] Fax:(336) [phone]   Clinic Follow up Note   Patient Care Team: [name], MD as PCP - General (Family Medicine) Cameron Sprang, MD as Consulting Physician (Neurology) Jonnie Finner, RN as Oncology Nurse Navigator Truitt Merle, MD as Consulting Physician (Hematology) Ronnette Juniper, MD as Consulting Physician (Gastroenterology)  Date of Service:  08/13/2019  CHIEF COMPLAINT: F/u rectal cancer   SUMMARY OF ONCOLOGIC HISTORY: Oncology History Overview Note  Cancer Staging Rectal adenocarcinoma [facility]) Staging form: Colon and Rectum, AJCC 8th Edition - Clinical stage from 06/12/2019: cT4, cN1 - Unsigned    Rectal adenocarcinoma ([place])  05/29/2019 Procedure   Colonoscopy per Dr. Therisa Doyne A digital rectal exam was normal  frond-like fungating infiltrative non-obstructing large mass in the rectum. The mass was partially circumferential, measured 5 cm in length. The colonoscopy also showed 11 sessile polyps in the transverse and asecnding colon and hepatic flexure.    05/29/2019 Initial Biopsy   Pathology on the rectal biopsy showed at least intramucosal adenocarcinoma involving tubulovillous adenoma with high grade dysplasia.   06/05/2019 Imaging   CT CAP IMPRESSION: 1. Cholelithiasis and gallbladder wall thickening with equivocal pericholecystic inflammation. Acute cholecystitis not excluded. If there is clinical suspicion for acute cholecystitis, recommend ultrasound evaluation. 2. Approximately 5 cm irregular mass within the distal sigmoid colon with possible extension through the wall. No definite abnormal adjacent lymph nodes. 3. Mild omental haziness-nonspecific. Metastatic disease not excluded. 4. Indeterminate 3-4 mm RIGHT pulmonary nodules which could represent metastatic disease. These are too small for PET CT evaluation. Consider CT follow-up. 5. 2.5 cm irregular cystic mass within the RIGHT kidney. Elective MRI  recommended for further evaluation. 6. 4 mm nonobstructing LEFT LOWER pole renal calculus. 7. Coronary artery disease. 8. Aortic Atherosclerosis (ICD10-I70.0).   06/12/2019 Imaging   Staging MRI pelvis  FINDINGS: TUMOR LOCATION Tumor distance from Anal Verge/Skin Surface:  11.4 cm Tumor distance to Internal Anal Sphincter: 6.5 cm TUMOR DESCRIPTION Circumferential Extent: 100% Tumor Length: 8.3 cm T - CATEGORY Extension through Muscularis Propria: Yes>35mm=T3d Shortest Distance of any tumor/node from Mesorectal Fascia: 0 mm, along the left lateral and posterior walls (tumor abuts the left lateral pelvic sidewall and sacrum) Extramural Vascular Invasion/Tumor Thrombus: No Invasion of Anterior Peritoneal Reflection: No Involvement of Adjacent Organs or Pelvic Sidewall: Yes, involving the left lateral pelvic sidewall =T4 Levator Ani Involvement: No N - CATEGORY Mesorectal Lymph Nodes >=52mm: 2=N1 Extra-mesorectal Lymphadenopathy: No Other:  None. IMPRESSION: Rectal adenocarcinoma T stage: T4 Rectal adenocarcinoma N stage:  N1 Distance from tumor to the internal anal sphincter is 6.5 cm.   06/18/2019 Initial Diagnosis   Rectal adenocarcinoma ([place])   07/02/2019 -  Chemotherapy   Total Neoadjuvant FOLFOX q2weeks starting 07/02/19 for 4 months, followed by CC-ChemoRT   07/13/2019 PET scan   IMPRESSION: 1. Known rectal mass is intensely hypermetabolic compatible with primary rectal adenocarcinoma. No findings of FDG avid nodal metastasis or distant metastatic disease. 2. Hyperdense kidney cysts are identified bilaterally favored to represent hemorrhagic cyst. 3. Nonobstructing left renal calculi. 4. Aortic atherosclerosis, in addition to lad coronary artery disease. Please note that although the presence of coronary artery calcium documents the presence of coronary artery disease, the severity of this disease and any potential stenosis cannot be assessed on this non-gated CT  examination. Assessment for potential risk factor modification, dietary therapy or pharmacologic therapy may be warranted, if clinically indicated. Aortic Atherosclerosis (ICD10-I70.0). 5. Gallstones.      CURRENT  THERAPY:  Total Neoadjuvant FOLFOX q2weeks starting 07/02/19 for 4 months, followed by CC-ChemoRT   INTERVAL HISTORY:  Carlos [name] is here for a follow up and treatment. He presents to the clinic with his sister. He notes he has completed 3 cycles of FOLFOX. He notes he has had loose stool 3-4 times a day. He denies significant fatigue, nausea or vomiting. His weight is mostly stable. He has darkening of his hands, no cold sensitivity or neuropathy. He is still able to go to work full time.  He did see surgeon at [facility] and he was told surgery would not be offered until Fall 2021. He plans to return in 11/2019. His sister wonders can he have teeth pulled between chemo and RT.     REVIEW OF SYSTEMS:   Constitutional: Denies fevers, chills or abnormal weight loss Eyes: Denies blurriness of vision Ears, nose, mouth, throat, and face: Denies mucositis or sore throat Respiratory: Denies cough, dyspnea or wheezes Cardiovascular: Denies palpitation, chest discomfort or lower extremity swelling Gastrointestinal:  Denies nausea, heartburn (+) loose stool  Skin: Denies abnormal skin rashes (+) Darkening of hands Lymphatics: Denies new lymphadenopathy or easy bruising Neurological:Denies numbness, tingling or new weaknesses Behavioral/Psych: Mood is stable, no new changes  All other systems were reviewed with the patient and are negative.  MEDICAL HISTORY:  Past Medical History:  Diagnosis Date   Anxiety    Benign prostatic hyperplasia 03/30/2013   10/1 IMO update   Cancer ([place])    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/23/2019    SURGICAL HISTORY: Past Surgical History:  Procedure Laterality Date   PORTACATH PLACEMENT N/A 07/01/2019   Procedure:  PORT ULTRASOUND GUIDED PLACEMENT;  Surgeon: Alphonsa Overall, MD;  Location: [facility]S;  Service: General;  Laterality: N/A;   PROSTATE SURGERY  2004   TONSILECTOMY, ADENOIDECTOMY, BILATERAL MYRINGOTOMY AND TUBES      I have reviewed the social history and family history with the patient and they are unchanged from previous note.  ALLERGIES:  has No Known Allergies.  MEDICATIONS:  Current Outpatient Medications  Medication Sig Dispense Refill   acetaminophen (TYLENOL) 325 MG tablet Take 650 mg by mouth every 6 (six) hours as needed for moderate pain (pain).  (Patient not taking: Reported on 07/30/2019)     lidocaine-prilocaine (EMLA) cream Apply to affected area once (Patient not taking: Reported on 07/30/2019) 30 g 3   Misc Natural Products (FOCUSED MIND PO) Take 1 capsule by mouth daily. Shaklee Mindworks (Patient not taking: Reported on 07/30/2019)     Multiple Vitamins-Minerals (MULTIVITAMIN WITH MINERALS) tablet Take 1 tablet by mouth daily. Centrum Silver (Patient not taking: Reported on 07/30/2019)     ondansetron (ZOFRAN) 8 MG tablet Take 1 tablet (8 mg total) by mouth 2 (two) times daily as needed for refractory nausea / vomiting. Start on day 3 after chemotherapy. (Patient not taking: Reported on 07/30/2019) 30 tablet 1   prochlorperazine (COMPAZINE) 10 MG tablet Take 1 tablet (10 mg total) by mouth every 6 (six) hours as needed (Nausea or vomiting). (Patient not taking: Reported on 07/30/2019) 30 tablet 1   traMADol (ULTRAM) 50 MG tablet Take 1 tablet (50 mg total) by mouth every 6 (six) hours as needed for moderate pain. (Patient not taking: Reported on 07/30/2019) 12 tablet 0   No current facility-administered medications for this visit.   Facility-Administered Medications Ordered in Other Visits  Medication Dose Route Frequency Provider Last Rate Last Admin   fluorouracil (ADRUCIL) 4,050  mg in sodium chloride 0.9 % 69 mL chemo infusion  2,400 mg/m2 (Treatment Plan Recorded)  Intravenous 1 day or 1 dose Truitt Merle, MD       fluorouracil (ADRUCIL) chemo injection 650 mg  400 mg/m2 (Treatment Plan Recorded) Intravenous Once Truitt Merle, MD       leucovorin 672 mg in dextrose 5 % 250 mL infusion  400 mg/m2 (Treatment Plan Recorded) Intravenous Once Truitt Merle, MD       oxaliplatin (ELOXATIN) 145 mg in dextrose 5 % 500 mL chemo infusion  85 mg/m2 (Treatment Plan Recorded) Intravenous Once Truitt Merle, MD        PHYSICAL EXAMINATION: ECOG PERFORMANCE STATUS: 1 - Symptomatic but completely ambulatory  Vitals:   08/13/19 0907  BP: 132/80  Pulse: 79  Resp: 18  Temp: 97.8 F (36.6 C)  SpO2: 100%   Filed Weights   08/13/19 0907  Weight: 133 lb 3.2 oz (60.4 kg)    Due to COVID19 we will limit examination to appearance. Patient had no complaints.  GENERAL:alert, no distress and comfortable SKIN: skin color normal, no rashes or significant lesions EYES: normal, Conjunctiva are pink and non-injected, sclera clear (+) Skin darkening of hands NEURO: alert & oriented x 3 with fluent speech   LABORATORY DATA:  I have reviewed the data as listed CBC Latest Ref Rng & Units 08/13/2019 07/30/2019 07/16/2019  WBC 4.0 - 10.5 K/uL 7.6 6.1 5.6  Hemoglobin 13.0 - 17.0 g/dL 12.5(L) 12.4(L) 11.8(L)  Hematocrit 39 - 52 % 38.0(L) 38.6(L) 36.0(L)  Platelets 150 - 400 K/uL 184 181 190     CMP Latest Ref Rng & Units 08/13/2019 07/30/2019 07/16/2019  Glucose 70 - 99 mg/dL 113(H) 143(H) 108(H)  BUN 8 - 23 mg/dL 13 14 11   Creatinine 0.61 - 1.24 mg/dL 0.82 0.88 0.79  Sodium 135 - 145 mmol/L 142 139 140  Potassium 3.5 - 5.1 mmol/L 3.8 3.7 4.0  Chloride 98 - 111 mmol/L 105 105 108  CO2 22 - 32 mmol/L 28 24 27   Calcium 8.9 - 10.3 mg/dL 9.5 9.0 8.9  Total Protein 6.5 - 8.1 g/dL 6.7 6.6 6.2(L)  Total Bilirubin 0.3 - 1.2 mg/dL 0.6 0.5 0.5  Alkaline Phos 38 - 126 U/L 79 78 77  AST 15 - 41 U/L 22 24 19   ALT 0 - 44 U/L 18 17 17       RADIOGRAPHIC STUDIES: I have personally reviewed the  radiological images as listed and agreed with the findings in the report. No results found.   ASSESSMENT & PLAN:  Carlos [name] is a 74 y.o. male with    1. Adenocarcinoma of the rectum, cT4N1M0 stage III -He was recently diagnosed in 05/2019. He was found to have proximal rectal mass on first screening colonoscopy, with associated frequent stool and 10 lbs weight loss.  -06/05/19 CT CAP showed small indeterminate pulmonary nodules, no distant metastasis. This was staged T4N1 on MRI. There was omental haziness noted on CT, which was not obvious on MRI.  --I personally reviewed his 07/13/19 PET images with him and his sister today which shows no hypermetabolic nodes or distant mets except the known rectal mass which was hypermetabolic. -We discussed the standard of care for locally advanced stage III rectal cancer is total neoadjuvant chemotherapy, including 4 months of FOLFOX followed by concurrent cchemoRT with Xeloda for 5-6 weeks prior to proceeding with surgery. We plan to restage after 3 months of chemo. He has seen Dr. Marcello Moores. The  treatment goal is curative. He agreed.  -He started total neoadjuvant chemo FOLFOX q2weeks on 07/02/19. He had PAC placed on 07/01/19.  -S/p C3 FOLFOX he is tolerating chemo well without much skin toxicity and with loose stool 3-4 times a day. I recommend he use Imodium if he has more than 3 loose stool daily. He can titrate dose to avoid constipation.  -Labs reviewed, Hg 12.5 and overall adequate to proceed with C4 FOLFOX today and continue every 2 weeks.  -Plan to scan him again after C6.  -He did consult with Dr Morton Stall at [facility] who agrees with chemo and RT first and plans to f/u in 11/2019 as he will offer him surgery.  -F/u in 2 weeks.    2. Mild cognitive impairment -followed by [facility] -He is a reliable historian, independent with ADLs, a/o x4, no signs of obvious impairment -will monitor for neurotoxicities on chemo, especially oxaliplatin    PLAN: -Labs reviewed and adequate to proceed with C4 FOLFOX today at same dose  -Lab, flush, f/u and FOLFOX in 2, 4, 6, 8 weeks, make Day 1 infusions in the early morning.  -plan to repeat CT AP after cycle 6     No problem-specific Assessment & Plan notes found for this encounter.   No orders of the defined types were placed in this encounter.  All questions were answered. The patient knows to call the clinic with any problems, questions or concerns. No barriers to learning was detected. The total time spent in the appointment was 30 minutes.     Truitt Merle, MD 08/13/2019   I, Joslyn Devon, am acting as scribe for Truitt Merle, MD.   I have reviewed the above documentation for accuracy and completeness, and I agree with the above.

## 2019-08-06 NOTE — Telephone Encounter (Signed)
MR Springborn's sister Carlos [name] called for PET scan results

## 2019-08-07 NOTE — Telephone Encounter (Signed)
I spoke with Ms Volanda Napoleon and reviewed Mr Livers' PET scan results.  We reviewed his upcoming appts. She verbalized understanding.

## 2019-08-13 ENCOUNTER — Inpatient Hospital Stay: Payer: 59

## 2019-08-13 ENCOUNTER — Inpatient Hospital Stay: Payer: 59 | Attending: Nurse Practitioner

## 2019-08-13 ENCOUNTER — Encounter: Payer: Self-pay | Admitting: Hematology

## 2019-08-13 ENCOUNTER — Inpatient Hospital Stay: Payer: 59 | Admitting: Nutrition

## 2019-08-13 ENCOUNTER — Other Ambulatory Visit: Payer: Self-pay

## 2019-08-13 ENCOUNTER — Inpatient Hospital Stay: Payer: 59 | Admitting: Hematology

## 2019-08-13 VITALS — BP 132/80 | HR 79 | Temp 97.8°F | Resp 18 | Ht 67.0 in | Wt 133.2 lb

## 2019-08-13 DIAGNOSIS — D649 Anemia, unspecified: Secondary | ICD-10-CM | POA: Diagnosis not present

## 2019-08-13 DIAGNOSIS — Z5111 Encounter for antineoplastic chemotherapy: Secondary | ICD-10-CM | POA: Insufficient documentation

## 2019-08-13 DIAGNOSIS — C20 Malignant neoplasm of rectum: Secondary | ICD-10-CM

## 2019-08-13 DIAGNOSIS — Z95828 Presence of other vascular implants and grafts: Secondary | ICD-10-CM

## 2019-08-13 LAB — CBC WITH DIFFERENTIAL (CANCER CENTER ONLY)
Abs Immature Granulocytes: 0.01 10*3/uL (ref 0.00–0.07)
Basophils Absolute: 0 10*3/uL (ref 0.0–0.1)
Basophils Relative: 0 %
Eosinophils Absolute: 0.1 10*3/uL (ref 0.0–0.5)
Eosinophils Relative: 1 %
HCT: 38 % — ABNORMAL LOW (ref 39.0–52.0)
Hemoglobin: 12.5 g/dL — ABNORMAL LOW (ref 13.0–17.0)
Immature Granulocytes: 0 %
Lymphocytes Relative: 42 %
Lymphs Abs: 3.2 10*3/uL (ref 0.7–4.0)
MCH: 28.9 pg (ref 26.0–34.0)
MCHC: 32.9 g/dL (ref 30.0–36.0)
MCV: 88 fL (ref 80.0–100.0)
Monocytes Absolute: 0.9 10*3/uL (ref 0.1–1.0)
Monocytes Relative: 12 %
Neutro Abs: 3.4 10*3/uL (ref 1.7–7.7)
Neutrophils Relative %: 45 %
Platelet Count: 184 10*3/uL (ref 150–400)
RBC: 4.32 MIL/uL (ref 4.22–5.81)
RDW: 14.2 % (ref 11.5–15.5)
WBC Count: 7.6 10*3/uL (ref 4.0–10.5)
nRBC: 0 % (ref 0.0–0.2)

## 2019-08-13 LAB — CMP (CANCER CENTER ONLY)
ALT: 18 U/L (ref 0–44)
AST: 22 U/L (ref 15–41)
Albumin: 3.8 g/dL (ref 3.5–5.0)
Alkaline Phosphatase: 79 U/L (ref 38–126)
Anion gap: 9 (ref 5–15)
BUN: 13 mg/dL (ref 8–23)
CO2: 28 mmol/L (ref 22–32)
Calcium: 9.5 mg/dL (ref 8.9–10.3)
Chloride: 105 mmol/L (ref 98–111)
Creatinine: 0.82 mg/dL (ref 0.61–1.24)
GFR, Est AFR Am: >60 mL/min (ref >60)
GFR, Estimated: >60 mL/min (ref >60)
Glucose, Bld: 113 mg/dL — ABNORMAL HIGH (ref 70–99)
Potassium: 3.8 mmol/L (ref 3.5–5.1)
Sodium: 142 mmol/L (ref 135–145)
Total Bilirubin: 0.6 mg/dL (ref 0.3–1.2)
Total Protein: 6.7 g/dL (ref 6.5–8.1)

## 2019-08-13 MED ORDER — OXALIPLATIN CHEMO INJECTION 100 MG/20ML
85.0000 mg/m2 | Freq: Once | INTRAVENOUS | Status: AC
  Administered 2019-08-13: 145 mg via INTRAVENOUS
  Filled 2019-08-13: qty 10

## 2019-08-13 MED ORDER — SODIUM CHLORIDE 0.9% FLUSH
10.0000 mL | INTRAVENOUS | Status: DC | PRN
  Administered 2019-08-13: 10 mL via INTRAVENOUS
  Filled 2019-08-13: qty 10

## 2019-08-13 MED ORDER — PALONOSETRON HCL INJECTION 0.25 MG/5ML
0.2500 mg | Freq: Once | INTRAVENOUS | Status: AC
  Administered 2019-08-13: 0.25 mg via INTRAVENOUS

## 2019-08-13 MED ORDER — SODIUM CHLORIDE 0.9 % IV SOLN
10.0000 mg | Freq: Once | INTRAVENOUS | Status: AC
  Administered 2019-08-13: 10 mg via INTRAVENOUS
  Filled 2019-08-13: qty 10

## 2019-08-13 MED ORDER — PALONOSETRON HCL INJECTION 0.25 MG/5ML
INTRAVENOUS | Status: AC
  Filled 2019-08-13: qty 5

## 2019-08-13 MED ORDER — DEXTROSE 5 % IV SOLN
Freq: Once | INTRAVENOUS | Status: AC
  Filled 2019-08-13: qty 250

## 2019-08-13 MED ORDER — FLUOROURACIL CHEMO INJECTION 2.5 GM/50ML
400.0000 mg/m2 | Freq: Once | INTRAVENOUS | Status: AC
  Administered 2019-08-13: 650 mg via INTRAVENOUS
  Filled 2019-08-13: qty 13

## 2019-08-13 MED ORDER — LEUCOVORIN CALCIUM INJECTION 350 MG
400.0000 mg/m2 | Freq: Once | INTRAVENOUS | Status: AC
  Administered 2019-08-13: 672 mg via INTRAVENOUS
  Filled 2019-08-13: qty 33.6

## 2019-08-13 MED ORDER — SODIUM CHLORIDE 0.9 % IV SOLN
2400.0000 mg/m2 | INTRAVENOUS | Status: DC
  Administered 2019-08-13: 4050 mg via INTRAVENOUS
  Filled 2019-08-13: qty 81

## 2019-08-13 NOTE — Progress Notes (Signed)
Nutrition follow-up completed with patient in infusion for colorectal cancer. Weight decreased and was documented as 133.2 pounds on July 8 down from 135 pounds on June 10. His only complaint is 3-4 loose stools every day. Reports he is eating about the same. He is still not remembering to drink Ensure.  He asked me to write out instructions again for him.  Nutrition diagnosis: Food and nutrition related knowledge deficit continues.  Intervention: Provided samples of oral nutrition supplements with instructions to drink to daily between meals. Recommended patient drink Ensure very slowly. In for strategies for improving diarrhea. Encourage patient to increase oral intake for weight maintenance.  Monitoring, evaluation, goals: Patient will work to improve oral intake to maintain weight.  Next visit: Patient has my contact information for questions and concerns.  **Disclaimer: This note was dictated with voice recognition software. Similar sounding words can inadvertently be transcribed and this note may contain transcription errors which may not have been corrected upon publication of note.**

## 2019-08-13 NOTE — Patient Instructions (Signed)
[facility] Discharge Instructions for Patients Receiving Chemotherapy  Today you received the following chemotherapy agents Oxaliplatin (ELOXATIN), Leucovorin & Flourouracil (ADRUCIL).  To help prevent nausea and vomiting after your treatment, we encourage you to take your nausea medication as prescribed.   If you develop nausea and vomiting that is not controlled by your nausea medication, call the clinic.   BELOW ARE SYMPTOMS THAT SHOULD BE REPORTED IMMEDIATELY:  *FEVER GREATER THAN 100.5 F  *CHILLS WITH OR WITHOUT FEVER  NAUSEA AND VOMITING THAT IS NOT CONTROLLED WITH YOUR NAUSEA MEDICATION  *UNUSUAL SHORTNESS OF BREATH  *UNUSUAL BRUISING OR BLEEDING  TENDERNESS IN MOUTH AND THROAT WITH OR WITHOUT PRESENCE OF ULCERS  *URINARY PROBLEMS  *BOWEL PROBLEMS  UNUSUAL RASH Items with * indicate a potential emergency and should be followed up as soon as possible.  Feel free to call the clinic should you have any questions or concerns. The clinic phone number is [phone].  Please show the CHEMO ALERT CARD at check-in to the Emergency Department and triage nurse.   

## 2019-08-14 ENCOUNTER — Telehealth: Payer: Self-pay | Admitting: Hematology

## 2019-08-14 NOTE — Telephone Encounter (Signed)
Scheduled per 7/8 los. Spoke with pt's daughter and is aware of appts added.

## 2019-08-15 ENCOUNTER — Other Ambulatory Visit: Payer: Self-pay

## 2019-08-15 ENCOUNTER — Inpatient Hospital Stay: Payer: 59

## 2019-08-15 VITALS — BP 141/68 | HR 95 | Temp 98.2°F | Resp 18

## 2019-08-15 DIAGNOSIS — Z5111 Encounter for antineoplastic chemotherapy: Secondary | ICD-10-CM | POA: Diagnosis not present

## 2019-08-15 DIAGNOSIS — C20 Malignant neoplasm of rectum: Secondary | ICD-10-CM

## 2019-08-15 MED ORDER — HEPARIN SOD (PORK) LOCK FLUSH 100 UNIT/ML IV SOLN
500.0000 [IU] | Freq: Once | INTRAVENOUS | Status: AC | PRN
  Administered 2019-08-15: 500 [IU]
  Filled 2019-08-15: qty 5

## 2019-08-15 MED ORDER — SODIUM CHLORIDE 0.9% FLUSH
10.0000 mL | INTRAVENOUS | Status: DC | PRN
  Administered 2019-08-15: 10 mL
  Filled 2019-08-15: qty 10

## 2019-08-15 NOTE — Patient Instructions (Signed)

## 2019-08-18 ENCOUNTER — Other Ambulatory Visit: Payer: Self-pay

## 2019-08-18 ENCOUNTER — Ambulatory Visit: Payer: 59 | Attending: Neurology | Admitting: Speech Pathology

## 2019-08-18 DIAGNOSIS — R41841 Cognitive communication deficit: Secondary | ICD-10-CM | POA: Diagnosis present

## 2019-08-18 NOTE — Patient Instructions (Signed)
Get a dayplanner you can carry with you to help you keep track of your appointments. Bring it with you to the next session.  Memory Compensation Strategies  1. Use "WARM" strategy. W= write it down A=  associate it R=  repeat it M=  make a mental picture  2. You can keep a Social worker. Use a 3-ring notebook with sections for the following:  calendar, important names and phone numbers, medications, doctors' names/phone numbers, "to do list"/reminders, and a section to journal what you did each day  3. Use a calendar to write appointments down.  4. Write yourself a schedule for the day.  This can be placed on the calendar or in a separate section of the Memory Notebook.  Keeping a regular schedule can help memory.  5. Use medication organizer with sections for each day or morning/evening pills  You may need help loading it  6. Keep a basket, or pegboard by the door.   Place items that you need to take out with you in the basket or on the pegboard.  You may also want to include a message board for reminders.  7. Use sticky notes. Place sticky notes with reminders in a place where the task is performed.  For example:  "turn off the stove" placed by the stove, "lock the door" placed on the door at eye level, "take your medications" on the bathroom mirror or by the place where you normally take your medications  8. Use alarms/timers.  Use while cooking to remind yourself to check on food or as a reminder to take your medicine, or as a reminder to make a call, or as a reminder to perform another task, etc.  9. Use a small tape recorder to record important information and notes for yourself.

## 2019-08-20 NOTE — Addendum Note (Signed)
Addended by: Aliene Altes on: 08/20/2019 10:42 AM   Modules accepted: Orders
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
## 2019-08-21 NOTE — Progress Notes (Signed)
[facility]   Telephone:(336) [phone] Fax:(336) [phone]   Clinic Follow up Note   Patient Care Team: [name], MD as PCP - General (Family Medicine) Cameron Sprang, MD as Consulting Physician (Neurology) Jonnie Finner, RN as Oncology Nurse Navigator Truitt Merle, MD as Consulting Physician (Hematology) Ronnette Juniper, MD as Consulting Physician (Gastroenterology)  Date of Service:  08/27/2019  CHIEF COMPLAINT: F/u rectal cancer  SUMMARY OF ONCOLOGIC HISTORY: Oncology History Overview Note  Cancer Staging Rectal adenocarcinoma [facility]) Staging form: Colon and Rectum, AJCC 8th Edition - Clinical stage from 06/12/2019: cT4, cN1 - Unsigned    Rectal adenocarcinoma ([place])  05/29/2019 Procedure   Colonoscopy per Dr. Therisa Doyne A digital rectal exam was normal  frond-like fungating infiltrative non-obstructing large mass in the rectum. The mass was partially circumferential, measured 5 cm in length. The colonoscopy also showed 11 sessile polyps in the transverse and asecnding colon and hepatic flexure.    05/29/2019 Initial Biopsy   Pathology on the rectal biopsy showed at least intramucosal adenocarcinoma involving tubulovillous adenoma with high grade dysplasia.   06/05/2019 Imaging   CT CAP IMPRESSION: 1. Cholelithiasis and gallbladder wall thickening with equivocal pericholecystic inflammation. Acute cholecystitis not excluded. If there is clinical suspicion for acute cholecystitis, recommend ultrasound evaluation. 2. Approximately 5 cm irregular mass within the distal sigmoid colon with possible extension through the wall. No definite abnormal adjacent lymph nodes. 3. Mild omental haziness-nonspecific. Metastatic disease not excluded. 4. Indeterminate 3-4 mm RIGHT pulmonary nodules which could represent metastatic disease. These are too small for PET CT evaluation. Consider CT follow-up. 5. 2.5 cm irregular cystic mass within the RIGHT kidney. Elective MRI  recommended for further evaluation. 6. 4 mm nonobstructing LEFT LOWER pole renal calculus. 7. Coronary artery disease. 8. Aortic Atherosclerosis (ICD10-I70.0).   06/12/2019 Imaging   Staging MRI pelvis  FINDINGS: TUMOR LOCATION Tumor distance from Anal Verge/Skin Surface:  11.4 cm Tumor distance to Internal Anal Sphincter: 6.5 cm TUMOR DESCRIPTION Circumferential Extent: 100% Tumor Length: 8.3 cm T - CATEGORY Extension through Muscularis Propria: Yes>19mm=T3d Shortest Distance of any tumor/node from Mesorectal Fascia: 0 mm, along the left lateral and posterior walls (tumor abuts the left lateral pelvic sidewall and sacrum) Extramural Vascular Invasion/Tumor Thrombus: No Invasion of Anterior Peritoneal Reflection: No Involvement of Adjacent Organs or Pelvic Sidewall: Yes, involving the left lateral pelvic sidewall =T4 Levator Ani Involvement: No N - CATEGORY Mesorectal Lymph Nodes >=72mm: 2=N1 Extra-mesorectal Lymphadenopathy: No Other:  None. IMPRESSION: Rectal adenocarcinoma T stage: T4 Rectal adenocarcinoma N stage:  N1 Distance from tumor to the internal anal sphincter is 6.5 cm.   06/18/2019 Initial Diagnosis   Rectal adenocarcinoma ([place])   07/02/2019 -  Chemotherapy   Total Neoadjuvant FOLFOX q2weeks starting 07/02/19 for 4 months, followed by CC-ChemoRT   07/13/2019 PET scan   IMPRESSION: 1. Known rectal mass is intensely hypermetabolic compatible with primary rectal adenocarcinoma. No findings of FDG avid nodal metastasis or distant metastatic disease. 2. Hyperdense kidney cysts are identified bilaterally favored to represent hemorrhagic cyst. 3. Nonobstructing left renal calculi. 4. Aortic atherosclerosis, in addition to lad coronary artery disease. Please note that although the presence of coronary artery calcium documents the presence of coronary artery disease, the severity of this disease and any potential stenosis cannot be assessed on this non-gated CT  examination. Assessment for potential risk factor modification, dietary therapy or pharmacologic therapy may be warranted, if clinically indicated. Aortic Atherosclerosis (ICD10-I70.0). 5. Gallstones.      CURRENT THERAPY:  Total Neoadjuvant FOLFOX q2weeks starting 07/02/19 for 4 months, followed by CC-ChemoRT  INTERVAL HISTORY:  Carlos [name] is here for a follow up and treatment. He presents to the clinic alone. He notes he is doing well. He notes his last cycle chemo he had soft stool, not watery. He will have BM 2-3 times a day. He also notes soreness around rectum which is tolerable. He has been urinating more frequently. He denies dysuria. He denies recent fever, chills or pain. He denies cold sensitivity and overall no neuropathy. He notes he has skin darkening of hands. 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 He is working less days due to treatment.    REVIEW OF SYSTEMS:   Constitutional: Denies fevers, chills or abnormal weight loss Eyes: Denies blurriness of vision Ears, nose, mouth, throat, and face: Denies mucositis or sore throat Respiratory: Denies cough, dyspnea or wheezes Cardiovascular: Denies palpitation, chest discomfort or lower extremity swelling Gastrointestinal:  Denies nausea, heartburn (+) Loose stool, rectum soreness (+) Increased urination Skin: Denies abnormal skin rashes (+) Skin darkening of hands  Lymphatics: Denies new lymphadenopathy or easy bruising Neurological:Denies numbness, tingling or new weaknesses Behavioral/Psych: Mood is stable, no new changes  All other systems were reviewed with the patient and are negative.  MEDICAL HISTORY:  Past Medical History:  Diagnosis Date  . Anxiety   . Benign prostatic hyperplasia 03/30/2013   10/1 IMO update  . Cancer ([place])   . 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 PORTACATH PLACEMENT  N/A 07/01/2019   Procedure: PORT ULTRASOUND GUIDED PLACEMENT;  Surgeon: Alphonsa Overall, MD;  Location: [facility];  Service: General;  Laterality: N/A;  . PROSTATE SURGERY  2004  . TONSILECTOMY, ADENOIDECTOMY, BILATERAL MYRINGOTOMY AND TUBES      I have reviewed the social history and family history with the patient and they are unchanged from previous note.  ALLERGIES:  has No Known Allergies.  MEDICATIONS:  Current Outpatient Medications  Medication Sig Dispense Refill  . acetaminophen (TYLENOL) 325 MG tablet Take 650 mg by mouth every 6 (six) hours as needed for moderate pain (pain).  (Patient not taking: Reported on 07/30/2019)    . lidocaine-prilocaine (EMLA) cream Apply to affected area once (Patient not taking: Reported on 07/30/2019) 30 g 3  . Misc Natural Products (FOCUSED MIND PO) Take 1 capsule by mouth daily. Shaklee Mindworks (Patient not taking: Reported on 07/30/2019)    . Multiple Vitamins-Minerals (MULTIVITAMIN WITH MINERALS) tablet Take 1 tablet by mouth daily. Centrum Silver (Patient not taking: Reported on 07/30/2019)    . ondansetron (ZOFRAN) 8 MG tablet Take 1 tablet (8 mg total) by mouth 2 (two) times daily as needed for refractory nausea / vomiting. Start on day 3 after chemotherapy. (Patient not taking: Reported on 07/30/2019) 30 tablet 1  . prochlorperazine (COMPAZINE) 10 MG tablet Take 1 tablet (10 mg total) by mouth every 6 (six) hours as needed (Nausea or vomiting). (Patient not taking: Reported on 07/30/2019) 30 tablet 1  . traMADol (ULTRAM) 50 MG tablet Take 1 tablet (50 mg total) by mouth every 6 (six) hours as needed for moderate pain. (Patient not taking: Reported on 07/30/2019) 12 tablet 0   No current facility-administered medications for this visit.   Facility-Administered Medications Ordered in Other Visits  Medication Dose Route Frequency Provider Last Rate Last Admin  . fluorouracil (ADRUCIL) 4,050 mg in sodium chloride  0.9 % 69 mL chemo infusion  2,400 mg/m2  (Treatment Plan Recorded) Intravenous 1 day or 1 dose Truitt Merle, MD      . fluorouracil (ADRUCIL) chemo injection 650 mg  400 mg/m2 (Treatment Plan Recorded) Intravenous Once Truitt Merle, MD      . leucovorin 672 mg in dextrose 5 % 250 mL infusion  400 mg/m2 (Treatment Plan Recorded) Intravenous Once Truitt Merle, MD 142 mL/hr at 08/27/19 1021 672 mg at 08/27/19 1021  . oxaliplatin (ELOXATIN) 145 mg in dextrose 5 % 500 mL chemo infusion  85 mg/m2 (Treatment Plan Recorded) Intravenous Once Truitt Merle, MD 265 mL/hr at 08/27/19 1018 145 mg at 08/27/19 1018    PHYSICAL EXAMINATION: ECOG PERFORMANCE STATUS: 1 - Symptomatic but completely ambulatory Blood pressure 125/80, pulse 66, respirate 16, temperature 36.7, pulse ox 100% on room air Due to [place] we will limit examination to appearance. Patient had no complaints.  GENERAL:alert, no distress and comfortable SKIN: no rashes or significant lesions (+) Skin darkening of hands  EYES: normal, Conjunctiva are pink and non-injected, sclera clear  NEURO: alert & oriented x 3 with fluent speech   LABORATORY DATA:  I have reviewed the data as listed CBC Latest Ref Rng & Units 08/27/2019 08/13/2019 07/30/2019  WBC 4.0 - 10.5 K/uL 4.9 7.6 6.1  Hemoglobin 13.0 - 17.0 g/dL 12.4(L) 12.5(L) 12.4(L)  Hematocrit 39 - 52 % 37.8(L) 38.0(L) 38.6(L)  Platelets 150 - 400 K/uL 187 184 181     CMP Latest Ref Rng & Units 08/27/2019 08/13/2019 07/30/2019  Glucose 70 - 99 mg/dL 116(H) 113(H) 143(H)  BUN 8 - 23 mg/dL 15 13 14   Creatinine 0.61 - 1.24 mg/dL 0.80 0.82 0.88  Sodium 135 - 145 mmol/L 142 142 139  Potassium 3.5 - 5.1 mmol/L 3.9 3.8 3.7  Chloride 98 - 111 mmol/L 108 105 105  CO2 22 - 32 mmol/L 24 28 24   Calcium 8.9 - 10.3 mg/dL 9.4 9.5 9.0  Total Protein 6.5 - 8.1 g/dL 6.8 6.7 6.6  Total Bilirubin 0.3 - 1.2 mg/dL 0.7 0.6 0.5  Alkaline Phos 38 - 126 U/L 88 79 78  AST 15 - 41 U/L 24 22 24   ALT 0 - 44 U/L 19 18 17       RADIOGRAPHIC STUDIES: I have personally  reviewed the radiological images as listed and agreed with the findings in the report. No results found.   ASSESSMENT & PLAN:  Carlos [name] is a 74 y.o. male with    1. Adenocarcinoma of the rectum, cT4N1M0 stage III -He was recently diagnosed in 05/2019. He was found to have proximal rectal mass on first screening colonoscopy, with associated frequent stool and 10 lbs weight loss.  -06/05/19 CT CAP showed small indeterminate pulmonary nodules, no distant metastasis. This was staged T4N1 on MRI. There was omental haziness noted on CT, which was not obvious on MRI.  -I personally reviewed his 07/13/19 PET images with him and his sister today which shows no hypermetabolic nodes or distant mets except the known rectal mass which was hypermetabolic. -We discussed the standard of care for locally advanced stage III rectal cancer is total neoadjuvant chemotherapy, including 4 months of FOLFOX followed by concurrent chemoRT with Xeloda for 5-6 weeks prior to proceeding with surgery. We plan to restage after 3 months of chemo. He has seen Dr. Marcello Moores. The treatment goal is curative. He agreed.  -He did consult with Dr [facility] St[facility] at [facility] who agrees with chemo and RT  first and plans to f/u in 11/2019 as he will offer him surgery.  -He started total neoadjuvant chemo FOLFOX q2weeks on 07/02/19. He had PAC placed on 07/01/19.  -S/p C4 FOLFOX he is tolerating treatment well with increased urination, stool (2-3 times a day) with soft stool and mild rectum soreness. I encouraged him to watch himself after BM. He has no cold sensitivity or neuropathy, but mild skin darkening.  -Labs reviewed and shows mild anemia. Overall adequate to proceed with C5 FOLFOX today.  -Will do CT AP after C6. -f/u in 2 weeks    2. Mild cognitive impairment -followed by [facility] -He is a reliable historian, independent with ADLs, a/o x4, no signs of obvious impairment -will monitor for neurotoxicities on chemo, especially  oxaliplatin    PLAN: -Labs reviewed and adequate to proceed with C5 FOLFOX today at same dose  -Lab, flush, f/u and FOLFOX in 2 weeks -plan to repeat CT AP after cycle 6 in 3-4 weeks    No problem-specific Assessment & Plan notes found for this encounter.   Orders Placed This Encounter  Procedures  . CT Abdomen Pelvis W Contrast    Standing Status:   Future    Standing Expiration Date:   08/26/2020    Order Specific Question:   If indicated for the ordered procedure, I authorize the administration of contrast media per Radiology protocol    Answer:   Yes    Order Specific Question:   Preferred imaging location?    Answer:   [facility]    Order Specific Question:   Release to patient    Answer:   Immediate    Order Specific Question:   Is Oral Contrast requested for this exam?    Answer:   Yes, Per Radiology protocol    Order Specific Question:   Radiology Contrast Protocol - do NOT remove file path    Answer:   [email or web address]   All questions were answered. The patient knows to call the clinic with any problems, questions or concerns. No barriers to learning was detected. The total time spent in the appointment was 30 minutes.     Truitt Merle, MD 08/27/2019   I, Joslyn Devon, am acting as scribe for Truitt Merle, MD.   I have reviewed the above documentation for accuracy and completeness, and I agree with the above.

## 2019-08-27 ENCOUNTER — Inpatient Hospital Stay: Payer: 59

## 2019-08-27 ENCOUNTER — Encounter: Payer: 59 | Admitting: Nutrition

## 2019-08-27 ENCOUNTER — Other Ambulatory Visit: Payer: Self-pay

## 2019-08-27 ENCOUNTER — Inpatient Hospital Stay: Payer: 59 | Admitting: Hematology

## 2019-08-27 ENCOUNTER — Encounter: Payer: Self-pay | Admitting: Hematology

## 2019-08-27 ENCOUNTER — Other Ambulatory Visit: Payer: 59

## 2019-08-27 ENCOUNTER — Ambulatory Visit: Payer: 59

## 2019-08-27 ENCOUNTER — Ambulatory Visit: Payer: 59 | Admitting: Nurse Practitioner

## 2019-08-27 DIAGNOSIS — C20 Malignant neoplasm of rectum: Secondary | ICD-10-CM

## 2019-08-27 DIAGNOSIS — Z5111 Encounter for antineoplastic chemotherapy: Secondary | ICD-10-CM | POA: Diagnosis not present

## 2019-08-27 LAB — CBC WITH DIFFERENTIAL (CANCER CENTER ONLY)
Abs Immature Granulocytes: 0.01 10*3/uL (ref 0.00–0.07)
Basophils Absolute: 0 10*3/uL (ref 0.0–0.1)
Basophils Relative: 0 %
Eosinophils Absolute: 0.1 10*3/uL (ref 0.0–0.5)
Eosinophils Relative: 2 %
HCT: 37.8 % — ABNORMAL LOW (ref 39.0–52.0)
Hemoglobin: 12.4 g/dL — ABNORMAL LOW (ref 13.0–17.0)
Immature Granulocytes: 0 %
Lymphocytes Relative: 34 %
Lymphs Abs: 1.7 10*3/uL (ref 0.7–4.0)
MCH: 29 pg (ref 26.0–34.0)
MCHC: 32.8 g/dL (ref 30.0–36.0)
MCV: 88.5 fL (ref 80.0–100.0)
Monocytes Absolute: 0.7 10*3/uL (ref 0.1–1.0)
Monocytes Relative: 14 %
Neutro Abs: 2.4 10*3/uL (ref 1.7–7.7)
Neutrophils Relative %: 50 %
Platelet Count: 187 10*3/uL (ref 150–400)
RBC: 4.27 MIL/uL (ref 4.22–5.81)
RDW: 14.6 % (ref 11.5–15.5)
WBC Count: 4.9 10*3/uL (ref 4.0–10.5)
nRBC: 0 % (ref 0.0–0.2)

## 2019-08-27 LAB — CMP (CANCER CENTER ONLY)
ALT: 19 U/L (ref 0–44)
AST: 24 U/L (ref 15–41)
Albumin: 3.8 g/dL (ref 3.5–5.0)
Alkaline Phosphatase: 88 U/L (ref 38–126)
Anion gap: 10 (ref 5–15)
BUN: 15 mg/dL (ref 8–23)
CO2: 24 mmol/L (ref 22–32)
Calcium: 9.4 mg/dL (ref 8.9–10.3)
Chloride: 108 mmol/L (ref 98–111)
Creatinine: 0.8 mg/dL (ref 0.61–1.24)
GFR, Est AFR Am: >60 mL/min (ref >60)
GFR, Estimated: >60 mL/min (ref >60)
Glucose, Bld: 116 mg/dL — ABNORMAL HIGH (ref 70–99)
Potassium: 3.9 mmol/L (ref 3.5–5.1)
Sodium: 142 mmol/L (ref 135–145)
Total Bilirubin: 0.7 mg/dL (ref 0.3–1.2)
Total Protein: 6.8 g/dL (ref 6.5–8.1)

## 2019-08-27 LAB — CEA (IN HOUSE-CHCC): CEA (CHCC-In House): 1.67 ng/mL (ref 0.00–5.00)

## 2019-08-27 MED ORDER — LEUCOVORIN CALCIUM INJECTION 350 MG
400.0000 mg/m2 | Freq: Once | INTRAVENOUS | Status: AC
  Administered 2019-08-27: 672 mg via INTRAVENOUS
  Filled 2019-08-27: qty 33.6

## 2019-08-27 MED ORDER — PALONOSETRON HCL INJECTION 0.25 MG/5ML
0.2500 mg | Freq: Once | INTRAVENOUS | Status: AC
  Administered 2019-08-27: 0.25 mg via INTRAVENOUS

## 2019-08-27 MED ORDER — FLUOROURACIL CHEMO INJECTION 2.5 GM/50ML
400.0000 mg/m2 | Freq: Once | INTRAVENOUS | Status: AC
  Administered 2019-08-27: 650 mg via INTRAVENOUS
  Filled 2019-08-27: qty 13

## 2019-08-27 MED ORDER — SODIUM CHLORIDE 0.9 % IV SOLN
2400.0000 mg/m2 | INTRAVENOUS | Status: DC
  Administered 2019-08-27: 4050 mg via INTRAVENOUS
  Filled 2019-08-27: qty 81

## 2019-08-27 MED ORDER — PALONOSETRON HCL INJECTION 0.25 MG/5ML
INTRAVENOUS | Status: AC
  Filled 2019-08-27: qty 5

## 2019-08-27 MED ORDER — SODIUM CHLORIDE 0.9 % IV SOLN
10.0000 mg | Freq: Once | INTRAVENOUS | Status: AC
  Administered 2019-08-27: 10 mg via INTRAVENOUS
  Filled 2019-08-27: qty 10

## 2019-08-27 MED ORDER — DEXTROSE 5 % IV SOLN
Freq: Once | INTRAVENOUS | Status: AC
  Filled 2019-08-27: qty 250

## 2019-08-27 MED ORDER — OXALIPLATIN CHEMO INJECTION 100 MG/20ML
85.0000 mg/m2 | Freq: Once | INTRAVENOUS | Status: AC
  Administered 2019-08-27: 145 mg via INTRAVENOUS
  Filled 2019-08-27: qty 20

## 2019-08-27 NOTE — Patient Instructions (Signed)
[facility] Discharge Instructions for Patients Receiving Chemotherapy  Today you received the following chemotherapy agents Oxaliplatin, Leucovorin, 5FU  To help prevent nausea and vomiting after your treatment, we encourage you to take your nausea medication as directed   If you develop nausea and vomiting that is not controlled by your nausea medication, call the clinic.   BELOW ARE SYMPTOMS THAT SHOULD BE REPORTED IMMEDIATELY:  *FEVER GREATER THAN 100.5 F  *CHILLS WITH OR WITHOUT FEVER  NAUSEA AND VOMITING THAT IS NOT CONTROLLED WITH YOUR NAUSEA MEDICATION  *UNUSUAL SHORTNESS OF BREATH  *UNUSUAL BRUISING OR BLEEDING  TENDERNESS IN MOUTH AND THROAT WITH OR WITHOUT PRESENCE OF ULCERS  *URINARY PROBLEMS  *BOWEL PROBLEMS  UNUSUAL RASH Items with * indicate a potential emergency and should be followed up as soon as possible.  Feel free to call the clinic should you have any questions or concerns. The clinic phone number is [phone].  Please show the CHEMO ALERT CARD at check-in to the Emergency Department and triage nurse.   

## 2019-08-27 NOTE — Patient Instructions (Signed)

## 2019-08-28 ENCOUNTER — Telehealth: Payer: Self-pay | Admitting: Hematology

## 2019-08-28 NOTE — Telephone Encounter (Signed)
Scheduled as is. Central radiology will contact pt for scan. Per 7/22 los.

## 2019-08-29 ENCOUNTER — Inpatient Hospital Stay: Payer: 59

## 2019-08-29 ENCOUNTER — Other Ambulatory Visit: Payer: Self-pay

## 2019-08-29 VITALS — BP 117/58 | HR 103 | Temp 99.1°F | Resp 17 | Ht 67.0 in

## 2019-08-29 DIAGNOSIS — C20 Malignant neoplasm of rectum: Secondary | ICD-10-CM

## 2019-08-29 DIAGNOSIS — Z5111 Encounter for antineoplastic chemotherapy: Secondary | ICD-10-CM | POA: Diagnosis not present

## 2019-08-29 MED ORDER — SODIUM CHLORIDE 0.9% FLUSH
10.0000 mL | INTRAVENOUS | Status: DC | PRN
  Administered 2019-08-29: 10 mL
  Filled 2019-08-29: qty 10

## 2019-08-29 MED ORDER — HEPARIN SOD (PORK) LOCK FLUSH 100 UNIT/ML IV SOLN
500.0000 [IU] | Freq: Once | INTRAVENOUS | Status: AC | PRN
  Administered 2019-08-29: 500 [IU]
  Filled 2019-08-29: qty 5

## 2019-08-29 NOTE — Patient Instructions (Signed)

## 2019-09-03 ENCOUNTER — Ambulatory Visit: Payer: 59 | Admitting: Speech Pathology

## 2019-09-03 ENCOUNTER — Other Ambulatory Visit: Payer: Self-pay

## 2019-09-03 DIAGNOSIS — R41841 Cognitive communication deficit: Secondary | ICD-10-CM

## 2019-09-03 NOTE — Therapy (Signed)
[facility] [address] [place], [place], [address] Phone: [phone]   Fax:  [phone]  Speech Language Pathology Treatment  Patient Details  Name: Carlos [name] MRN: [id number] Date of Birth: 11-15-1945 Referring Provider (SLP): Dr. Delice Lesch   Encounter Date: 09/03/2019   End of Session - 09/03/19 1054    Visit Number 2    Number of Visits 5    Date for SLP Re-Evaluation 10/17/19    SLP Start Time 0850    SLP Stop Time  0930    SLP Time Calculation (min) 40 min    Activity Tolerance Patient tolerated treatment well           Past Medical History:  Diagnosis Date  . Anxiety   . Benign prostatic hyperplasia 03/30/2013   10/1 IMO update  . Cancer ([place])   . 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/23/2019    Past Surgical History:  Procedure Laterality Date  . PORTACATH PLACEMENT N/A 07/01/2019   Procedure: PORT ULTRASOUND GUIDED PLACEMENT;  Surgeon: Alphonsa Overall, MD;  Location: [facility];  Service: General;  Laterality: N/A;  . PROSTATE SURGERY  2004  . TONSILECTOMY, ADENOIDECTOMY, BILATERAL MYRINGOTOMY AND TUBES      There were no vitals filed for this visit.   Subjective Assessment - 09/03/19 0855    Subjective Pt brought finance spreadsheet    Currently in Pain? No/denies                 ADULT SLP TREATMENT - 09/03/19 0855      General Information   Behavior/Cognition Alert;Cooperative      Treatment Provided   Treatment provided Cognitive-Linquistic      Pain Assessment   Pain Assessment No/denies pain      Cognitive-Linquistic Treatment   Treatment focused on Cognition;Patient/family/caregiver education    Skilled Treatment Patient brought spreadsheet of finances and a monthly calendar for August with abbreviations written for chores (C= chores, G=gardening, L= Laundry, etc). This was compiled for him by sister Ruthie (not present). Sister [name] with patient again  today. Patient reports this was just made and he hasn't used it yet. Acknowledges that he needs to be more "structured" in order to get things done. SLP talked with pt and sister about his routine and that as pt has not habitually used a calendar in the past, he is likely to need reminders daily to engage with this new schedule. He required extended time, min cues for alternating attention to ID match chore abbreviations Ruthie put on calendar to the "key" written at the top. Provided feedback that larger calendar might benefit pt, with more space so that chores, rather than abbreviations, could be written, as well as information such as medical appointments. Pt is currently undergoing chemo and will be beginning radiation soon. Sister [name] is to call patient daily in the morning to remind him to check his calendar. Pt lives with his elderly mother, whose care worker packs his lunch on the days she is there (weekdays). Patient has been forgetting to pack his lunch to take to work on Saturdays and Sundays. We added a reminder to his calendar for this. Patient required redirection from tangential/perseverative topics x 3 today. Written instructions/recommendations provided for pt and additional copies for family members who will be assisting pt.       Assessment / Recommendations / Plan   Plan Continue with current plan of care      Progression Toward Goals  Progression toward goals Progressing toward goals            SLP Education - 09/03/19 1053    Education Details use of routine and schedule; pt will need assistance to habituate use of a new schedule/routine    Person(s) Educated Patient   Sister [name]   Methods Explanation;Verbal cues;Handout    Comprehension Verbalized understanding;Need further instruction              SLP Long Term Goals - 09/03/19 1056      SLP LONG TERM GOAL #1   Title Patient will demonstrate carryover of memory compensation system x 2 visits.    Time 8   or  5 total sessions   Period Weeks    Status On-going      SLP LONG TERM GOAL #2   Title Patient will demonstrate knowledge of appropriate cognitive activities for home.    Time 8   or 5 total sessions   Period Weeks    Status On-going            Plan - 09/03/19 1054    Clinical Impression Statement [name] presents with functionally moderate cognitive communication deficits characterized by impaired attention (selective, attention to detail), working and visual memory, short-term recall, emergent awareness, as well as higher level functions such as organization and problem solving. Supportive family; patient brought schedule and "structure" created by sister to session today. SLP educated re: compensatory strategies and that recommendation is to work on skills in a functional context as this would be expected to generalize better than simply "brain training." Patient would benefit from skilled ST to train in compensatory strategies for areas of cognitive deficits in order to improve functioning in daily activities and increase safety and quality of life. As patient is undergoing treatment for cancer, his ability to attend appointments regularly is limited; this may impact his ability to progress with therapy.    Speech Therapy Frequency --   recommended 1x a week for 8 weeks; pt requests every other week   Duration --   1x every other week for 8 weeks   Potential to Achieve Goals Fair    Potential Considerations Ability to learn/carryover information;Other (comment)   awareness, unable to attend therapy regularly   [place] Pt to bring planner or notebook    Consulted and Agree with Plan of Care Patient;Family member/caregiver           Patient will benefit from skilled therapeutic intervention in order to improve the following deficits and impairments:   Cognitive communication deficit    Problem List Patient Active Problem List   Diagnosis Date Noted  . Rectal  adenocarcinoma ([place]) 06/18/2019  . 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 Benign prostatic hyperplasia 03/30/2013   Carlos [name], [place], [place] 09/03/2019, 10:56 AM  [facility] [address] [place], [place], [address] Phone: [phone]   Fax:  [phone]   Name: AVROM ROBARTS MRN: [id number] Date of Birth: 08/04/1945

## 2019-09-03 NOTE — Patient Instructions (Signed)
Put your calendar in a place where you can see it and interact with it daily. It might help to write the actual word or task rather than abbreviating it (maybe a larger calendar would help) Cross off the previous day first thing each morning.  Put a check or cross off each activity as you complete it.  Try to avoid multitasking. One thing at a time. For "multi-step" processes, like laundry, try using a timer (so you remember when to put things in the dryer or fold them).  Establish a routine (ie: general times that you complete certain activities).  Write in any appointments you have coming up (chemo treatments, Speech appointments, etc).  You could also write in due dates for bills, etc.   Try using your calendar for the next 2 weeks. Let's see how it works. It might help for [name] to call you in the morning and remind you to check your schedule each morning. We can also consider making a daily schedule for you if the monthly view isn't working.     Memory Compensation Strategies  1. Use "WARM" strategy. W= write it down A=  associate it R=  repeat it M=  make a mental picture  2. You can keep a Social worker. Use a 3-ring notebook with sections for the following:  calendar, important names and phone numbers, medications, doctors' names/phone numbers, "to do list"/reminders, and a section to journal what you did each day  3. Use a calendar to write appointments down.  4. Write yourself a schedule for the day.  This can be placed on the calendar or in a separate section of the Memory Notebook.  Keeping a regular schedule can help memory.  5. Use medication organizer with sections for each day or morning/evening pills  You may need help loading it  6. Keep a basket, or pegboard by the door.   Place items that you need to take out with you in the basket or on the pegboard.  You may also want to include a message board for reminders.  7. Use sticky notes. Place sticky notes with  reminders in a place where the task is performed.  For example:  "turn off the stove" placed by the stove, "lock the door" placed on the door at eye level, "take your medications" on the bathroom mirror or by the place where you normally take your medications  8. Use alarms/timers.  Use while cooking to remind yourself to check on food or as a reminder to take your medicine, or as a reminder to make a call, or as a reminder to perform another task, etc.  9. Use a small tape recorder to record important information and notes for yourself.

## 2019-09-04 NOTE — Progress Notes (Signed)
[facility]   Telephone:([phone] [phone]   Clinic Follow up Note   Patient Care Team: [name], MD as PCP - General (Family Medicine) Cameron Sprang, MD as Consulting Physician (Neurology) Jonnie Finner, RN as Oncology Nurse Navigator Truitt Merle, MD as Consulting Physician (Hematology) Ronnette Juniper, MD as Consulting Physician (Gastroenterology)  Date of Service:  09/10/2019  CHIEF COMPLAINT: F/u rectal cancer  SUMMARY OF ONCOLOGIC HISTORY: Oncology History Overview Note  Cancer Staging Rectal adenocarcinoma [facility]) Staging form: Colon and Rectum, AJCC 8th Edition - Clinical stage from 06/12/2019: cT4, cN1 - Unsigned    Rectal adenocarcinoma ([place])  05/29/2019 Procedure   Colonoscopy per Dr. Therisa Doyne A digital rectal exam was normal  frond-like fungating infiltrative non-obstructing large mass in the rectum. The mass was partially circumferential, measured 5 cm in length. The colonoscopy also showed 11 sessile polyps in the transverse and asecnding colon and hepatic flexure.    05/29/2019 Initial Biopsy   Pathology on the rectal biopsy showed at least intramucosal adenocarcinoma involving tubulovillous adenoma with high grade dysplasia.   06/05/2019 Imaging   CT CAP IMPRESSION: 1. Cholelithiasis and gallbladder wall thickening with equivocal pericholecystic inflammation. Acute cholecystitis not excluded. If there is clinical suspicion for acute cholecystitis, recommend ultrasound evaluation. 2. Approximately 5 cm irregular mass within the distal sigmoid colon with possible extension through the wall. No definite abnormal adjacent lymph nodes. 3. Mild omental haziness-nonspecific. Metastatic disease not excluded. 4. Indeterminate 3-4 mm RIGHT pulmonary nodules which could represent metastatic disease. These are too small for PET CT evaluation. Consider CT follow-up. 5. 2.5 cm irregular cystic mass within the RIGHT kidney. Elective MRI  recommended for further evaluation. 6. 4 mm nonobstructing LEFT LOWER pole renal calculus. 7. Coronary artery disease. 8. Aortic Atherosclerosis (ICD10-I70.0).   06/12/2019 Imaging   Staging MRI pelvis  FINDINGS: TUMOR LOCATION Tumor distance from Anal Verge/Skin Surface:  11.4 cm Tumor distance to Internal Anal Sphincter: 6.5 cm TUMOR DESCRIPTION Circumferential Extent: 100% Tumor Length: 8.3 cm T - CATEGORY Extension through Muscularis Propria: Yes>7mm=T3d Shortest Distance of any tumor/node from Mesorectal Fascia: 0 mm, along the left lateral and posterior walls (tumor abuts the left lateral pelvic sidewall and sacrum) Extramural Vascular Invasion/Tumor Thrombus: No Invasion of Anterior Peritoneal Reflection: No Involvement of Adjacent Organs or Pelvic Sidewall: Yes, involving the left lateral pelvic sidewall =T4 Levator Ani Involvement: No N - CATEGORY Mesorectal Lymph Nodes >=27mm: 2=N1 Extra-mesorectal Lymphadenopathy: No Other:  None. IMPRESSION: Rectal adenocarcinoma T stage: T4 Rectal adenocarcinoma N stage:  N1 Distance from tumor to the internal anal sphincter is 6.5 cm.   06/18/2019 Initial Diagnosis   Rectal adenocarcinoma ([place])   07/02/2019 -  Chemotherapy   Total Neoadjuvant FOLFOX q2weeks starting 07/02/19 for 4 months, followed by CC-ChemoRT   07/13/2019 PET scan   IMPRESSION: 1. Known rectal mass is intensely hypermetabolic compatible with primary rectal adenocarcinoma. No findings of FDG avid nodal metastasis or distant metastatic disease. 2. Hyperdense kidney cysts are identified bilaterally favored to represent hemorrhagic cyst. 3. Nonobstructing left renal calculi. 4. Aortic atherosclerosis, in addition to lad coronary artery disease. Please note that although the presence of coronary artery calcium documents the presence of coronary artery disease, the severity of this disease and any potential stenosis cannot be assessed on this non-gated CT  examination. Assessment for potential risk factor modification, dietary therapy or pharmacologic therapy may be warranted, if clinically indicated. Aortic Atherosclerosis (ICD10-I70.0). 5. Gallstones.      CURRENT THERAPY:  Total Neoadjuvant FOLFOX q2weeks starting 07/02/19 for 4 months, followed by CC-ChemoRT  INTERVAL HISTORY:  Carlos [name] is here for a follow up and treatment. He presents to the clinic alone. He notes he is doing fairly well. He notes the rectal irritation when passing BM. He notes this is no longer occurring as he no longer has diarrhea. He has not been taking imodium. He wonders can he increase his work load to 10 hours a day so he can make more money. He notes so far he has been able to go to work everyday on chemo. 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 He does note his sister may not agree as she would be concerned about his health and is protective of him.     REVIEW OF SYSTEMS:   Constitutional: Denies fevers, chills or abnormal weight loss Eyes: Denies blurriness of vision Ears, nose, mouth, throat, and face: Denies mucositis or sore throat Respiratory: Denies cough, dyspnea or wheezes Cardiovascular: Denies palpitation, chest discomfort or lower extremity swelling Gastrointestinal:  Denies nausea, heartburn or change in bowel habits (+) Improved diarrhea and rectal soreness Skin: Denies abnormal skin rashes Lymphatics: Denies new lymphadenopathy or easy bruising Neurological:Denies numbness, tingling or new weaknesses Behavioral/Psych: Mood is stable, no new changes  All other systems were reviewed with the patient and are negative.   MEDICAL HISTORY:  Past Medical History:  Diagnosis Date  . Anxiety   . Benign prostatic hyperplasia 03/30/2013   10/1 IMO update  . Cancer ([place])   . 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/23/2019    SURGICAL HISTORY: Past Surgical History:  Procedure  Laterality Date  . PORTACATH PLACEMENT N/A 07/01/2019   Procedure: PORT ULTRASOUND GUIDED PLACEMENT;  Surgeon: Alphonsa Overall, MD;  Location: [facility];  Service: General;  Laterality: N/A;  . PROSTATE SURGERY  2004  . TONSILECTOMY, ADENOIDECTOMY, BILATERAL MYRINGOTOMY AND TUBES      I have reviewed the social history and family history with the patient and they are unchanged from previous note.  ALLERGIES:  has No Known Allergies.  MEDICATIONS:  Current Outpatient Medications  Medication Sig Dispense Refill  . acetaminophen (TYLENOL) 325 MG tablet Take 650 mg by mouth every 6 (six) hours as needed for moderate pain (pain).  (Patient not taking: Reported on 07/30/2019)    . lidocaine-prilocaine (EMLA) cream Apply to affected area once (Patient not taking: Reported on 07/30/2019) 30 g 3  . Misc Natural Products (FOCUSED MIND PO) Take 1 capsule by mouth daily. Shaklee Mindworks (Patient not taking: Reported on 07/30/2019)    . Multiple Vitamins-Minerals (MULTIVITAMIN WITH MINERALS) tablet Take 1 tablet by mouth daily. Centrum Silver (Patient not taking: Reported on 07/30/2019)    . ondansetron (ZOFRAN) 8 MG tablet Take 1 tablet (8 mg total) by mouth 2 (two) times daily as needed for refractory nausea / vomiting. Start on day 3 after chemotherapy. (Patient not taking: Reported on 07/30/2019) 30 tablet 1  . prochlorperazine (COMPAZINE) 10 MG tablet Take 1 tablet (10 mg total) by mouth every 6 (six) hours as needed (Nausea or vomiting). (Patient not taking: Reported on 07/30/2019) 30 tablet 1  . traMADol (ULTRAM) 50 MG tablet Take 1 tablet (50 mg total) by mouth every 6 (six) hours as needed for moderate pain. (Patient not taking: Reported on 07/30/2019) 12 tablet 0   No current facility-administered medications for this visit.   Facility-Administered Medications Ordered in Other Visits  Medication Dose Route Frequency Provider Last Rate Last Admin  . fluorouracil (ADRUCIL) 4,050 mg in sodium chloride 0.9 %  69 mL chemo infusion  2,400 mg/m2 (Treatment Plan Recorded) Intravenous 1 day or 1 dose Truitt Merle, MD      . fluorouracil (ADRUCIL) chemo injection 650 mg  400 mg/m2 (Treatment Plan Recorded) Intravenous Once Truitt Merle, MD      . leucovorin 672 mg in dextrose 5 % 250 mL infusion  400 mg/m2 (Treatment Plan Recorded) Intravenous Once Truitt Merle, MD 142 mL/hr at 09/10/19 1136 672 mg at 09/10/19 1136  . oxaliplatin (ELOXATIN) 145 mg in dextrose 5 % 500 mL chemo infusion  85 mg/m2 (Treatment Plan Recorded) Intravenous Once Truitt Merle, MD 265 mL/hr at 09/10/19 1140 145 mg at 09/10/19 1140    PHYSICAL EXAMINATION: ECOG PERFORMANCE STATUS: 1 - Symptomatic but completely ambulatory  Vitals:   09/10/19 0915  BP: (!) 147/95  Pulse: 81  Resp: 18  Temp: 98.4 F (36.9 C)  SpO2: 100%   Filed Weights   09/10/19 0915  Weight: 134 lb 8 oz (61 kg)    GENERAL:alert, no distress and comfortable SKIN: skin color, texture, turgor are normal, no rashes or significant lesions EYES: normal, Conjunctiva are pink and non-injected, sclera clear  NECK: supple, thyroid normal size, non-tender, without nodularity LYMPH:  no palpable lymphadenopathy in the cervical, axillary  LUNGS: clear to auscultation and percussion with normal breathing effort HEART: regular rate & rhythm and no murmurs and no lower extremity edema ABDOMEN:abdomen soft, non-tender and normal bowel sounds Musculoskeletal:no cyanosis of digits and no clubbing  NEURO: alert & oriented x 3 with fluent speech, no focal motor/sensory deficits  LABORATORY DATA:  I have reviewed the data as listed CBC Latest Ref Rng & Units 09/10/2019 08/27/2019 08/13/2019  WBC 4.0 - 10.5 K/uL 5.4 4.9 7.6  Hemoglobin 13.0 - 17.0 g/dL 12.5(L) 12.4(L) 12.5(L)  Hematocrit 39 - 52 % 37.6(L) 37.8(L) 38.0(L)  Platelets 150 - 400 K/uL 172 187 184     CMP Latest Ref Rng & Units 09/10/2019 08/27/2019 08/13/2019  Glucose 70 - 99 mg/dL 114(H) 116(H) 113(H)  BUN 8 - 23 mg/dL 12 15  13   Creatinine 0.61 - 1.24 mg/dL 0.84 0.80 0.82  Sodium 135 - 145 mmol/L 141 142 142  Potassium 3.5 - 5.1 mmol/L 3.9 3.9 3.8  Chloride 98 - 111 mmol/L 107 108 105  CO2 22 - 32 mmol/L 25 24 28   Calcium 8.9 - 10.3 mg/dL 9.6 9.4 9.5  Total Protein 6.5 - 8.1 g/dL 6.8 6.8 6.7  Total Bilirubin 0.3 - 1.2 mg/dL 0.5 0.7 0.6  Alkaline Phos 38 - 126 U/L 82 88 79  AST 15 - 41 U/L 31 24 22   ALT 0 - 44 U/L 20 19 18       RADIOGRAPHIC STUDIES: I have personally reviewed the radiological images as listed and agreed with the findings in the report. No results found.   ASSESSMENT & PLAN:  Carlos [name] is a 74 y.o. male with    1. Adenocarcinoma of the rectum, cT4N1M0 stage III -He was recently diagnosed in 05/2019. He was found to have proximal rectal mass on first screening colonoscopy, with associated frequent stool and 10 lbs weight loss.  -06/05/19 CT CAP showed small indeterminate pulmonary nodules, no distant metastasis. This was staged T4N1 on MRI. There was omental haziness noted on CT, which was not obvious on MRI. -His 07/13/19 PET showsno hypermetabolic nodes or distant mets except  theknown rectal mass whichwas hypermetabolic. -We discussed the standard of care for locally advanced stage III rectal cancer is total neoadjuvant chemotherapy, including 4 months of FOLFOX followed byconcurrent chemoRT with Xeloda for 5-6 weeks prior to proceeding with surgery. We plan to restage after 3 months of chemo. He has seen Dr. Marcello Moores. The treatment goal is curative. He agreed.  -He did consult with Dr Morton Stall at [facility] who agrees with chemo and RT first and plans to f/u in 11/2019 as he will offer him surgery.  -He started total neoadjuvant chemoFOLFOX q2weekson 07/02/19. Plan for 8 cycles.  -S/p C5 he is tolerating treatment well. His diarrhea overall resolved. For any further diarrhea, I recommend he use OTC Imodium. He has been able to maintain weight and remain hydrated.  -Labs reviewed, Hg 12.5,  BG 114. Overall adequate to proceed with C6 FOLFOX today.  -Will monitor his response to treatment with a CT AP before next visit.  -f/u in 2 weeks    2. Mild cognitive impairment, Social support  -followed by [employer] [facility] -He is a reliable historian, independent with ADLs, a/o x4, no signs of obvious impairment -Will monitor for neurotoxicities on chemo, especially oxaliplatin  -He has very good family support from his 2 sisters.  -He currently has been able to go to work everyday since being on chemo, working 8 hours a day for 5 days a week. He wonders can he increase his work day load from 8 to 10 hours a day so he can make more money. Given his good tolerance to chemo so far, I provided him with letter to clear him to try this for 2 weeks. I encouraged him to reduce hours it he cannot tolerate. He is agreeable.    PLAN: -Labs reviewed and adequate to proceed with C6 FOLFOX todayat same dose -Lab, flush, f/u and FOLFOX in 2, 4, 6 weeks  -CT AP w contrast in 10-14 days  -I provided him with work letter for more hours.    No problem-specific Assessment & Plan notes found for this encounter.   Orders Placed This Encounter  Procedures  . CT Abdomen Pelvis W Contrast    Standing Status:   Future    Standing Expiration Date:   09/09/2020    Order Specific Question:   If indicated for the ordered procedure, I authorize the administration of contrast media per Radiology protocol    Answer:   Yes    Order Specific Question:   Preferred imaging location?    Answer:   [facility]    Order Specific Question:   Release to patient    Answer:   Immediate    Order Specific Question:   Is Oral Contrast requested for this exam?    Answer:   Yes, Per Radiology protocol    Order Specific Question:   Radiology Contrast Protocol - do NOT remove file path    Answer:   [email or web address]   All questions were answered. The patient knows to call the clinic with  any problems, questions or concerns. No barriers to learning was detected. The total time spent in the appointment was 30 minutes.     Truitt Merle, MD 09/10/2019   I, Joslyn Devon, am acting as scribe for Truitt Merle, MD.   I have reviewed the above documentation for accuracy and completeness, and I agree with the above.

## 2019-09-10 ENCOUNTER — Inpatient Hospital Stay: Payer: 59 | Attending: Nurse Practitioner

## 2019-09-10 ENCOUNTER — Other Ambulatory Visit: Payer: Self-pay

## 2019-09-10 ENCOUNTER — Inpatient Hospital Stay: Payer: 59

## 2019-09-10 ENCOUNTER — Encounter: Payer: Self-pay | Admitting: Hematology

## 2019-09-10 ENCOUNTER — Inpatient Hospital Stay: Payer: 59 | Admitting: Hematology

## 2019-09-10 VITALS — BP 147/95 | HR 81 | Temp 98.4°F | Resp 18 | Ht 67.0 in | Wt 134.5 lb

## 2019-09-10 DIAGNOSIS — Z79899 Other long term (current) drug therapy: Secondary | ICD-10-CM | POA: Insufficient documentation

## 2019-09-10 DIAGNOSIS — C20 Malignant neoplasm of rectum: Secondary | ICD-10-CM

## 2019-09-10 DIAGNOSIS — Z95828 Presence of other vascular implants and grafts: Secondary | ICD-10-CM

## 2019-09-10 DIAGNOSIS — Z23 Encounter for immunization: Secondary | ICD-10-CM | POA: Insufficient documentation

## 2019-09-10 DIAGNOSIS — Z5111 Encounter for antineoplastic chemotherapy: Secondary | ICD-10-CM | POA: Insufficient documentation

## 2019-09-10 LAB — CBC WITH DIFFERENTIAL (CANCER CENTER ONLY)
Abs Immature Granulocytes: 0.02 10*3/uL (ref 0.00–0.07)
Basophils Absolute: 0 10*3/uL (ref 0.0–0.1)
Basophils Relative: 0 %
Eosinophils Absolute: 0.1 10*3/uL (ref 0.0–0.5)
Eosinophils Relative: 2 %
HCT: 37.6 % — ABNORMAL LOW (ref 39.0–52.0)
Hemoglobin: 12.5 g/dL — ABNORMAL LOW (ref 13.0–17.0)
Immature Granulocytes: 0 %
Lymphocytes Relative: 42 %
Lymphs Abs: 2.2 10*3/uL (ref 0.7–4.0)
MCH: 29 pg (ref 26.0–34.0)
MCHC: 33.2 g/dL (ref 30.0–36.0)
MCV: 87.2 fL (ref 80.0–100.0)
Monocytes Absolute: 0.8 10*3/uL (ref 0.1–1.0)
Monocytes Relative: 16 %
Neutro Abs: 2.2 10*3/uL (ref 1.7–7.7)
Neutrophils Relative %: 40 %
Platelet Count: 172 10*3/uL (ref 150–400)
RBC: 4.31 MIL/uL (ref 4.22–5.81)
RDW: 15 % (ref 11.5–15.5)
WBC Count: 5.4 10*3/uL (ref 4.0–10.5)
nRBC: 0 % (ref 0.0–0.2)

## 2019-09-10 LAB — CMP (CANCER CENTER ONLY)
ALT: 20 U/L (ref 0–44)
AST: 31 U/L (ref 15–41)
Albumin: 3.9 g/dL (ref 3.5–5.0)
Alkaline Phosphatase: 82 U/L (ref 38–126)
Anion gap: 9 (ref 5–15)
BUN: 12 mg/dL (ref 8–23)
CO2: 25 mmol/L (ref 22–32)
Calcium: 9.6 mg/dL (ref 8.9–10.3)
Chloride: 107 mmol/L (ref 98–111)
Creatinine: 0.84 mg/dL (ref 0.61–1.24)
GFR, Est AFR Am: >60 mL/min (ref >60)
GFR, Estimated: >60 mL/min (ref >60)
Glucose, Bld: 114 mg/dL — ABNORMAL HIGH (ref 70–99)
Potassium: 3.9 mmol/L (ref 3.5–5.1)
Sodium: 141 mmol/L (ref 135–145)
Total Bilirubin: 0.5 mg/dL (ref 0.3–1.2)
Total Protein: 6.8 g/dL (ref 6.5–8.1)

## 2019-09-10 MED ORDER — OXALIPLATIN CHEMO INJECTION 100 MG/20ML
85.0000 mg/m2 | Freq: Once | INTRAVENOUS | Status: AC
  Administered 2019-09-10: 145 mg via INTRAVENOUS
  Filled 2019-09-10: qty 20

## 2019-09-10 MED ORDER — FLUOROURACIL CHEMO INJECTION 2.5 GM/50ML
400.0000 mg/m2 | Freq: Once | INTRAVENOUS | Status: AC
  Administered 2019-09-10: 650 mg via INTRAVENOUS
  Filled 2019-09-10: qty 13

## 2019-09-10 MED ORDER — SODIUM CHLORIDE 0.9 % IV SOLN
10.0000 mg | Freq: Once | INTRAVENOUS | Status: AC
  Administered 2019-09-10: 10 mg via INTRAVENOUS
  Filled 2019-09-10: qty 10

## 2019-09-10 MED ORDER — HEPARIN SOD (PORK) LOCK FLUSH 100 UNIT/ML IV SOLN
500.0000 [IU] | Freq: Once | INTRAVENOUS | Status: DC
  Filled 2019-09-10: qty 5

## 2019-09-10 MED ORDER — PALONOSETRON HCL INJECTION 0.25 MG/5ML
0.2500 mg | Freq: Once | INTRAVENOUS | Status: AC
  Administered 2019-09-10: 0.25 mg via INTRAVENOUS

## 2019-09-10 MED ORDER — SODIUM CHLORIDE 0.9 % IV SOLN
2400.0000 mg/m2 | INTRAVENOUS | Status: DC
  Administered 2019-09-10: 4050 mg via INTRAVENOUS
  Filled 2019-09-10: qty 81

## 2019-09-10 MED ORDER — DEXTROSE 5 % IV SOLN
Freq: Once | INTRAVENOUS | Status: AC
  Filled 2019-09-10: qty 250

## 2019-09-10 MED ORDER — PALONOSETRON HCL INJECTION 0.25 MG/5ML
INTRAVENOUS | Status: AC
  Filled 2019-09-10: qty 5

## 2019-09-10 MED ORDER — SODIUM CHLORIDE 0.9% FLUSH
10.0000 mL | Freq: Once | INTRAVENOUS | Status: AC
  Administered 2019-09-10: 10 mL via INTRAVENOUS
  Filled 2019-09-10: qty 10

## 2019-09-10 MED ORDER — LEUCOVORIN CALCIUM INJECTION 350 MG
400.0000 mg/m2 | Freq: Once | INTRAVENOUS | Status: AC
  Administered 2019-09-10: 672 mg via INTRAVENOUS
  Filled 2019-09-10: qty 33.6

## 2019-09-10 NOTE — Patient Instructions (Signed)
[facility] Discharge Instructions for Patients Receiving Chemotherapy  Today you received the following chemotherapy agents: oxaliplatin, leucovorin, and fluorouracil.  To help prevent nausea and vomiting after your treatment, we encourage you to take your nausea medication as directed.   If you develop nausea and vomiting that is not controlled by your nausea medication, call the clinic.   BELOW ARE SYMPTOMS THAT SHOULD BE REPORTED IMMEDIATELY:  *FEVER GREATER THAN 100.5 F  *CHILLS WITH OR WITHOUT FEVER  NAUSEA AND VOMITING THAT IS NOT CONTROLLED WITH YOUR NAUSEA MEDICATION  *UNUSUAL SHORTNESS OF BREATH  *UNUSUAL BRUISING OR BLEEDING  TENDERNESS IN MOUTH AND THROAT WITH OR WITHOUT PRESENCE OF ULCERS  *URINARY PROBLEMS  *BOWEL PROBLEMS  UNUSUAL RASH Items with * indicate a potential emergency and should be followed up as soon as possible.  Feel free to call the clinic should you have any questions or concerns. The clinic phone number is [phone].  Please show the CHEMO ALERT CARD at check-in to the Emergency Department and triage nurse.   

## 2019-09-11 ENCOUNTER — Emergency Department (HOSPITAL_COMMUNITY): Payer: 59

## 2019-09-11 ENCOUNTER — Inpatient Hospital Stay (HOSPITAL_COMMUNITY): Payer: 59

## 2019-09-11 ENCOUNTER — Other Ambulatory Visit: Payer: Self-pay

## 2019-09-11 ENCOUNTER — Encounter (HOSPITAL_COMMUNITY): Payer: Self-pay

## 2019-09-11 ENCOUNTER — Telehealth: Payer: Self-pay | Admitting: Hematology

## 2019-09-11 ENCOUNTER — Inpatient Hospital Stay (HOSPITAL_COMMUNITY)
Admission: EM | Admit: 2019-09-11 | Discharge: 2019-09-14 | DRG: 071 | Disposition: A | Discharge status: Home / Self Care | Payer: 59 | Attending: Internal Medicine | Admitting: Internal Medicine

## 2019-09-11 ENCOUNTER — Inpatient Hospital Stay (HOSPITAL_COMMUNITY)
Admit: 2019-09-11 | Discharge: 2019-09-11 | Disposition: A | Discharge status: Home / Self Care | Payer: 59 | Attending: Internal Medicine | Admitting: Internal Medicine

## 2019-09-11 DIAGNOSIS — N400 Enlarged prostate without lower urinary tract symptoms: Secondary | ICD-10-CM | POA: Diagnosis present

## 2019-09-11 DIAGNOSIS — G9340 Encephalopathy, unspecified: Secondary | ICD-10-CM

## 2019-09-11 DIAGNOSIS — Z20822 Contact with and (suspected) exposure to covid-19: Secondary | ICD-10-CM | POA: Diagnosis present

## 2019-09-11 DIAGNOSIS — Z95828 Presence of other vascular implants and grafts: Secondary | ICD-10-CM

## 2019-09-11 DIAGNOSIS — Z8262 Family history of osteoporosis: Secondary | ICD-10-CM | POA: Diagnosis not present

## 2019-09-11 DIAGNOSIS — Z833 Family history of diabetes mellitus: Secondary | ICD-10-CM

## 2019-09-11 DIAGNOSIS — Z808 Family history of malignant neoplasm of other organs or systems: Secondary | ICD-10-CM | POA: Diagnosis not present

## 2019-09-11 DIAGNOSIS — R4182 Altered mental status, unspecified: Secondary | ICD-10-CM | POA: Diagnosis not present

## 2019-09-11 DIAGNOSIS — G40909 Epilepsy, unspecified, not intractable, without status epilepticus: Secondary | ICD-10-CM | POA: Diagnosis present

## 2019-09-11 DIAGNOSIS — C20 Malignant neoplasm of rectum: Secondary | ICD-10-CM | POA: Diagnosis present

## 2019-09-11 DIAGNOSIS — G3184 Mild cognitive impairment, so stated: Secondary | ICD-10-CM | POA: Diagnosis present

## 2019-09-11 DIAGNOSIS — Z79891 Long term (current) use of opiate analgesic: Secondary | ICD-10-CM | POA: Diagnosis not present

## 2019-09-11 DIAGNOSIS — R569 Unspecified convulsions: Secondary | ICD-10-CM

## 2019-09-11 DIAGNOSIS — R509 Fever, unspecified: Secondary | ICD-10-CM

## 2019-09-11 DIAGNOSIS — R6510 Systemic inflammatory response syndrome (SIRS) of non-infectious origin without acute organ dysfunction: Secondary | ICD-10-CM | POA: Diagnosis present

## 2019-09-11 DIAGNOSIS — Z781 Physical restraint status: Secondary | ICD-10-CM

## 2019-09-11 DIAGNOSIS — D72829 Elevated white blood cell count, unspecified: Secondary | ICD-10-CM | POA: Diagnosis present

## 2019-09-11 DIAGNOSIS — Z79899 Other long term (current) drug therapy: Secondary | ICD-10-CM | POA: Diagnosis not present

## 2019-09-11 DIAGNOSIS — F03A0 Unspecified dementia, mild, without behavioral disturbance, psychotic disturbance, mood disturbance, and anxiety: Secondary | ICD-10-CM | POA: Diagnosis present

## 2019-09-11 HISTORY — DX: Presence of other vascular implants and grafts: Z95.828

## 2019-09-11 HISTORY — DX: Encephalopathy, unspecified: G93.40

## 2019-09-11 LAB — CBC WITH DIFFERENTIAL/PLATELET
Abs Immature Granulocytes: 0.04 10*3/uL (ref 0.00–0.07)
Basophils Absolute: 0 10*3/uL (ref 0.0–0.1)
Basophils Relative: 0 %
Eosinophils Absolute: 0 10*3/uL (ref 0.0–0.5)
Eosinophils Relative: 0 %
HCT: 36.7 % — ABNORMAL LOW (ref 39.0–52.0)
Hemoglobin: 12.3 g/dL — ABNORMAL LOW (ref 13.0–17.0)
Immature Granulocytes: 0 %
Lymphocytes Relative: 5 %
Lymphs Abs: 0.6 10*3/uL — ABNORMAL LOW (ref 0.7–4.0)
MCH: 29.9 pg (ref 26.0–34.0)
MCHC: 33.5 g/dL (ref 30.0–36.0)
MCV: 89.3 fL (ref 80.0–100.0)
Monocytes Absolute: 1.2 10*3/uL — ABNORMAL HIGH (ref 0.1–1.0)
Monocytes Relative: 10 %
Neutro Abs: 10.2 10*3/uL — ABNORMAL HIGH (ref 1.7–7.7)
Neutrophils Relative %: 85 %
Platelets: 145 10*3/uL — ABNORMAL LOW (ref 150–400)
RBC: 4.11 MIL/uL — ABNORMAL LOW (ref 4.22–5.81)
RDW: 15.2 % (ref 11.5–15.5)
WBC: 12 10*3/uL — ABNORMAL HIGH (ref 4.0–10.5)
nRBC: 0 % (ref 0.0–0.2)

## 2019-09-11 LAB — CSF CELL COUNT WITH DIFFERENTIAL
RBC Count, CSF: 0 /mm3
RBC Count, CSF: 2 /mm3 — ABNORMAL HIGH
Tube #: 1
Tube #: 4
WBC, CSF: 1 /mm3 (ref 0–5)
WBC, CSF: 1 /mm3 (ref 0–5)

## 2019-09-11 LAB — PROTIME-INR
INR: 1 (ref 0.8–1.2)
Prothrombin Time: 13.1 seconds (ref 11.4–15.2)

## 2019-09-11 LAB — URINALYSIS, COMPLETE (UACMP) WITH MICROSCOPIC
Bilirubin Urine: NEGATIVE
Glucose, UA: NEGATIVE mg/dL
Hgb urine dipstick: NEGATIVE
Ketones, ur: NEGATIVE mg/dL
Leukocytes,Ua: NEGATIVE
Nitrite: NEGATIVE
Protein, ur: 30 mg/dL — AB
Specific Gravity, Urine: 1.017 (ref 1.005–1.030)
pH: 5 (ref 5.0–8.0)

## 2019-09-11 LAB — COMPREHENSIVE METABOLIC PANEL
ALT: 31 U/L (ref 0–44)
AST: 42 U/L — ABNORMAL HIGH (ref 15–41)
Albumin: 4 g/dL (ref 3.5–5.0)
Alkaline Phosphatase: 67 U/L (ref 38–126)
Anion gap: 10 (ref 5–15)
BUN: 16 mg/dL (ref 8–23)
CO2: 23 mmol/L (ref 22–32)
Calcium: 8.8 mg/dL — ABNORMAL LOW (ref 8.9–10.3)
Chloride: 105 mmol/L (ref 98–111)
Creatinine, Ser: 0.89 mg/dL (ref 0.61–1.24)
GFR calc Af Amer: >60 mL/min (ref >60)
GFR calc non Af Amer: >60 mL/min (ref >60)
Glucose, Bld: 116 mg/dL — ABNORMAL HIGH (ref 70–99)
Potassium: 3.6 mmol/L (ref 3.5–5.1)
Sodium: 138 mmol/L (ref 135–145)
Total Bilirubin: 0.5 mg/dL (ref 0.3–1.2)
Total Protein: 6.8 g/dL (ref 6.5–8.1)

## 2019-09-11 LAB — LACTIC ACID, PLASMA
Lactic Acid, Venous: 1.8 mmol/L (ref 0.5–1.9)
Lactic Acid, Venous: 1.8 mmol/L (ref 0.5–1.9)

## 2019-09-11 LAB — CBG MONITORING, ED: Glucose-Capillary: 113 mg/dL — ABNORMAL HIGH (ref 70–99)

## 2019-09-11 LAB — CRYPTOCOCCAL ANTIGEN, CSF: Crypto Ag: NEGATIVE

## 2019-09-11 LAB — AMMONIA: Ammonia: 25 umol/L (ref 9–35)

## 2019-09-11 LAB — GLUCOSE, CSF: Glucose, CSF: 79 mg/dL — ABNORMAL HIGH (ref 40–70)

## 2019-09-11 LAB — PROCALCITONIN: Procalcitonin: <0.1 ng/mL

## 2019-09-11 LAB — PROTEIN, CSF: Total  Protein, CSF: 24 mg/dL (ref 15–45)

## 2019-09-11 LAB — SARS CORONAVIRUS 2 BY RT PCR (HOSPITAL ORDER, PERFORMED IN ~~LOC~~ HOSPITAL LAB): SARS Coronavirus 2: NEGATIVE

## 2019-09-11 LAB — APTT: aPTT: 22 seconds — ABNORMAL LOW (ref 24–36)

## 2019-09-11 IMAGING — CT CT HEAD W/O CM
3 series · 15 of 47 positions shown, 18 images · non-contrast
Comparison: None.

CLINICAL DATA: Altered mental status

EXAM:
CT HEAD WITHOUT CONTRAST
TECHNIQUE: Contiguous axial images were obtained from the base of the skull
through the vertex without intravenous contrast.

[Series 2: head wo · axial · 0.47mm/px · z∈[-126,-1]mm · 9 of 31 slices shown, 12 images]
[im 3/31  brain]
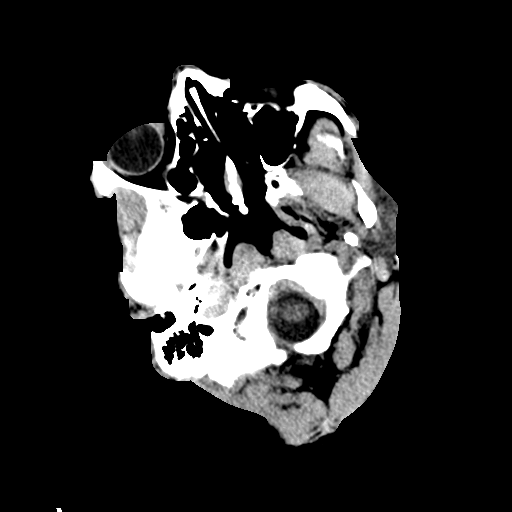
[im 3/31  bone]
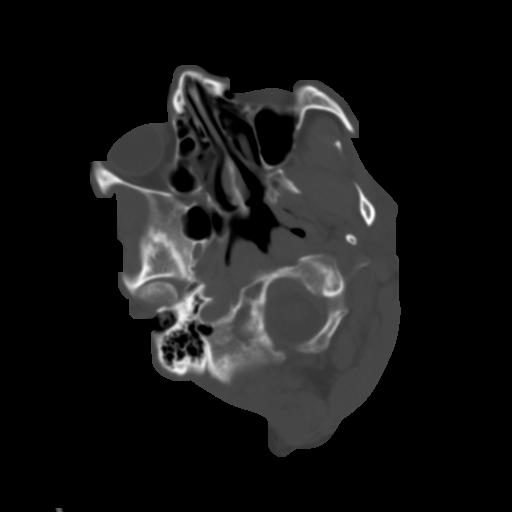
[im 6/31  brain]
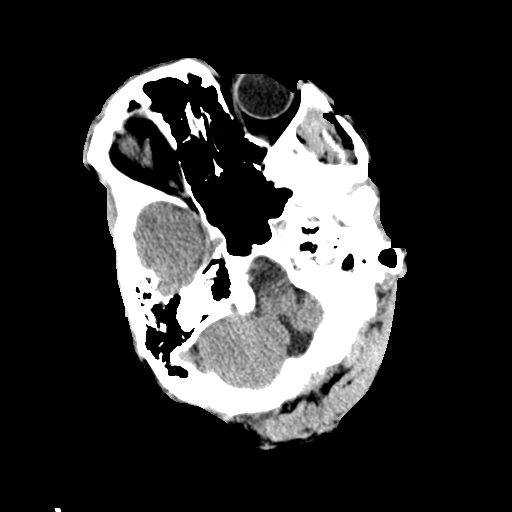
[im 9/31  brain]
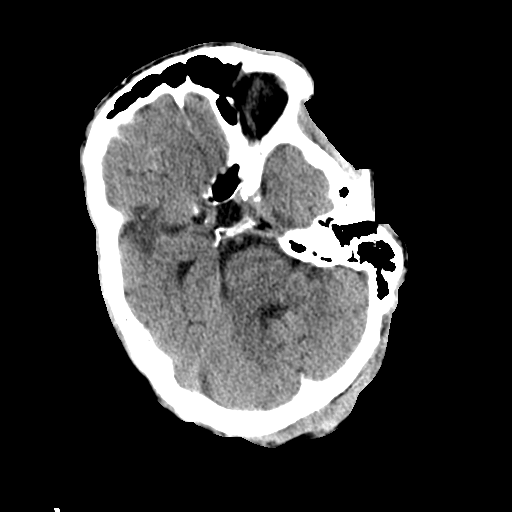
[im 12/31  brain]
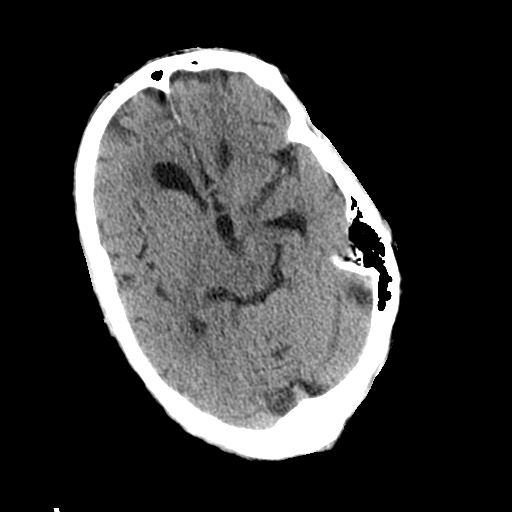
[im 16/31  brain]
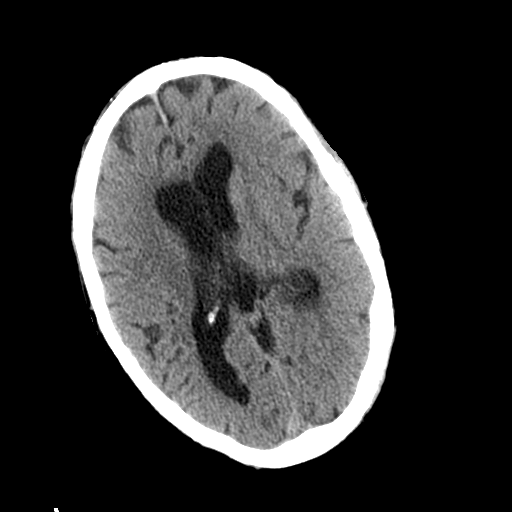
[im 16/31  bone]
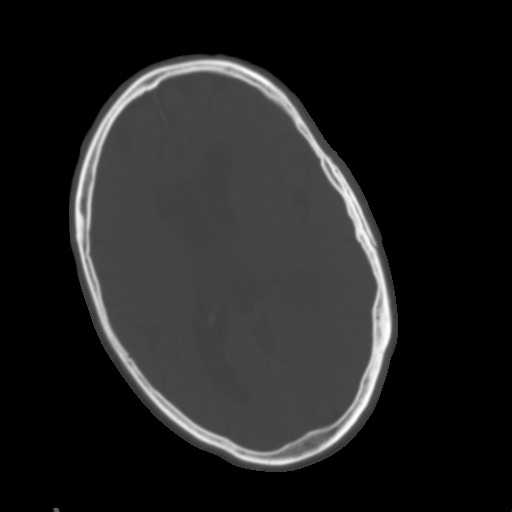
[im 19/31  brain]
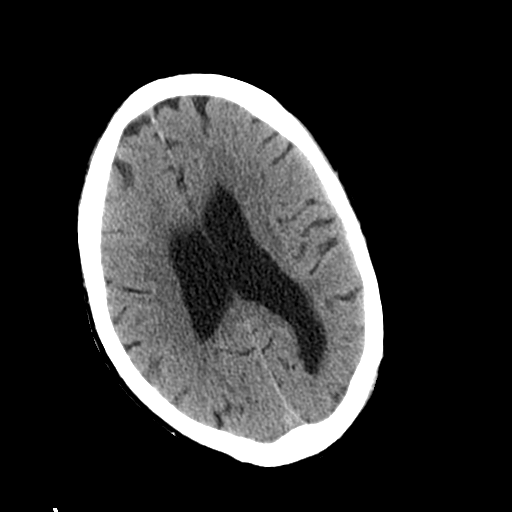
[im 22/31  brain]
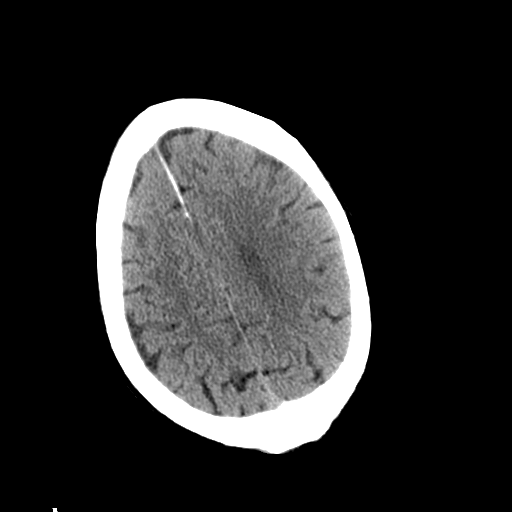
[im 25/31  brain]
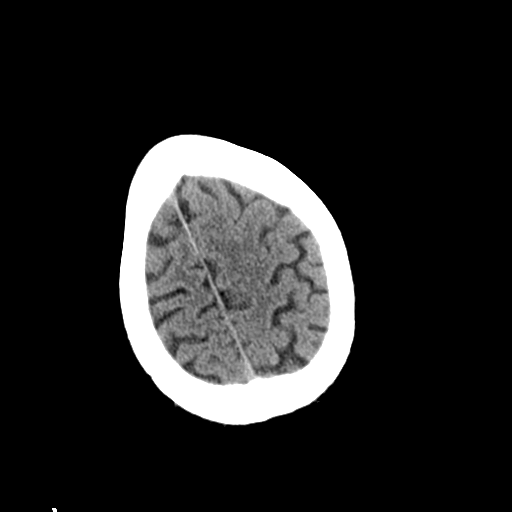
[im 28/31  brain]
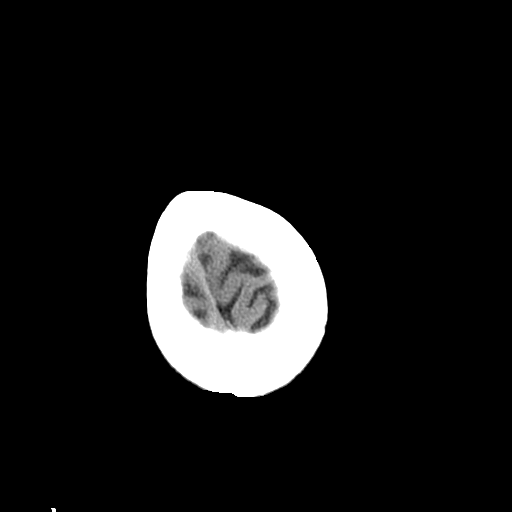
[im 28/31  bone]
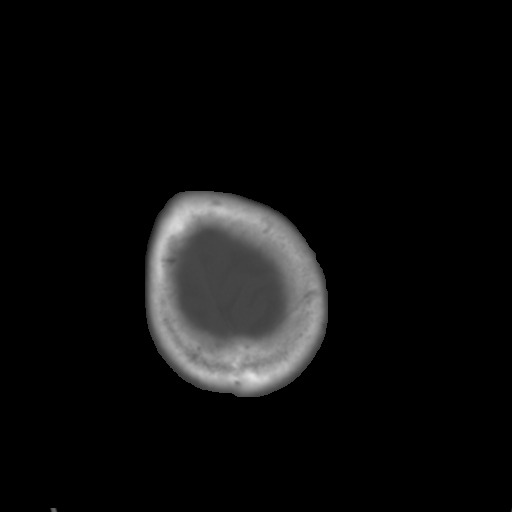

[Series 5: coronal soft tissue · coronal · 0.32mm/px · 3 of 76 slices shown]
[im 26/76  brain]
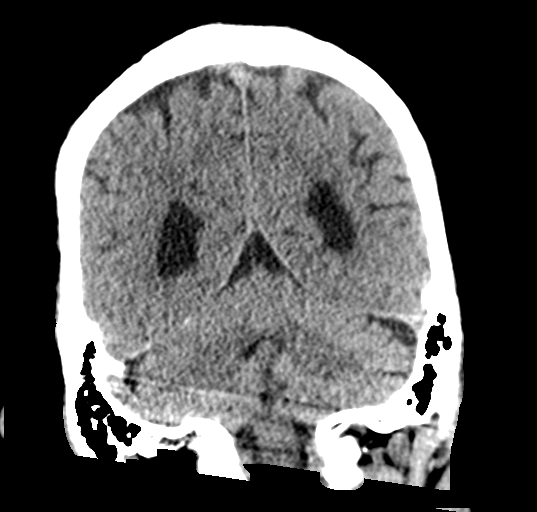
[im 34/76  brain]
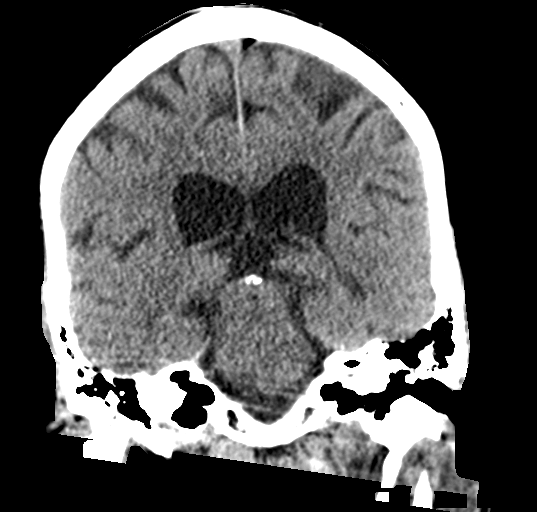
[im 42/76  brain]
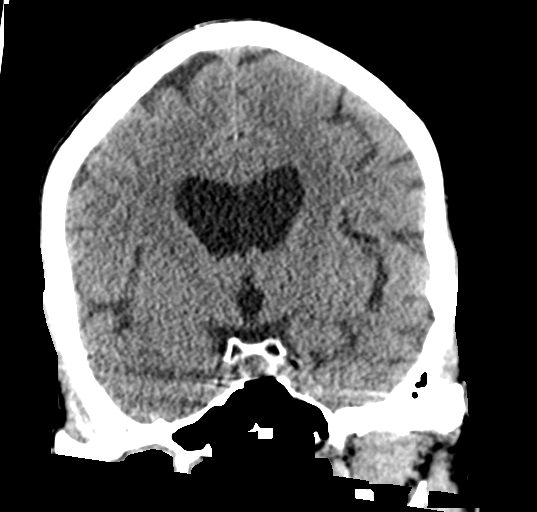

[Series 6: sagittal soft tissue · sagittal · 0.31mm/px · 3 of 59 slices shown]
[im 21/59  brain]
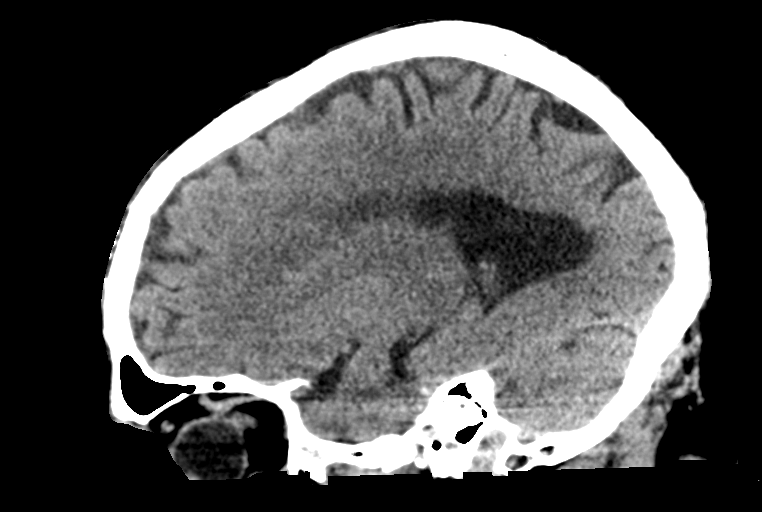
[im 30/59  brain]
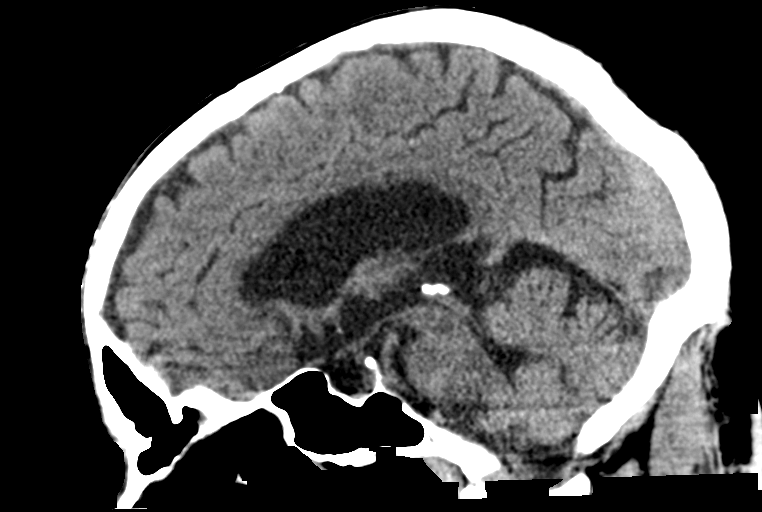
[im 38/59  brain]
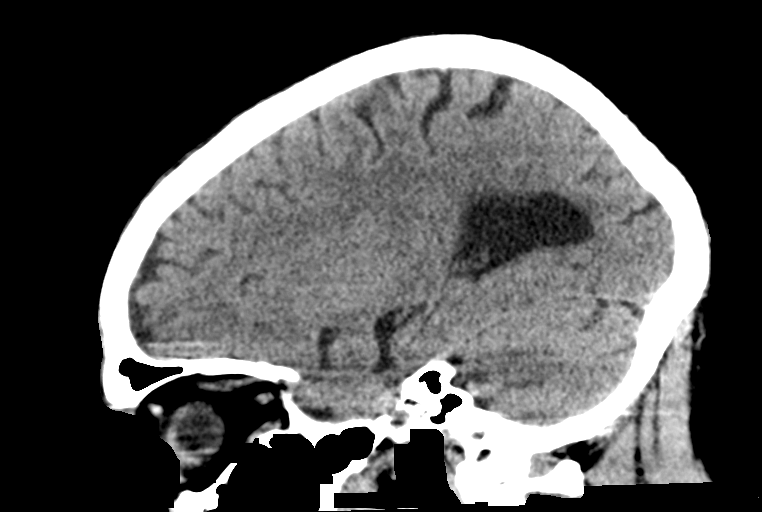

[15 of 47 positions shown; findings below may reference images not displayed]

FINDINGS: Brain: Ventricles are mildly prominent with normal appearing sulci.
There is no intracranial mass, hemorrhage, extra-axial fluid
collection, or midline shift. The brain parenchyma appears
unremarkable. No acute infarct demonstrable.

Vascular: No hyperdense vessel. There are foci of calcification in
each carotid siphon region.

Skull: Bony calvarium appears intact.

Sinuses/Orbits: There is mucosal thickening in several ethmoid air
cells. Visualized orbits appear symmetric bilaterally.

Other: Mastoid air cells are clear. There is debris in the right
external auditory canal.
IMPRESSION: Mild ventricular prominence with normal appearing sulci. Question
early communicating hydrocephalus. Brain parenchyma appears
unremarkable. No acute infarct. No mass or hemorrhage.

Foci of arterial vascular calcification noted. There is mucosal
thickening in several ethmoid air cells.

There is probable cerumen in the right external auditory canal.

## 2019-09-11 IMAGING — DX DG CHEST 1V PORT
1 series · 1 of 1 positions shown · non-contrast
Comparison: Chest x-ray dated [DATE]

CLINICAL DATA: Combative.  History of rectal adenocarcinoma.

EXAM:
PORTABLE CHEST 1 VIEW

[chest ap]
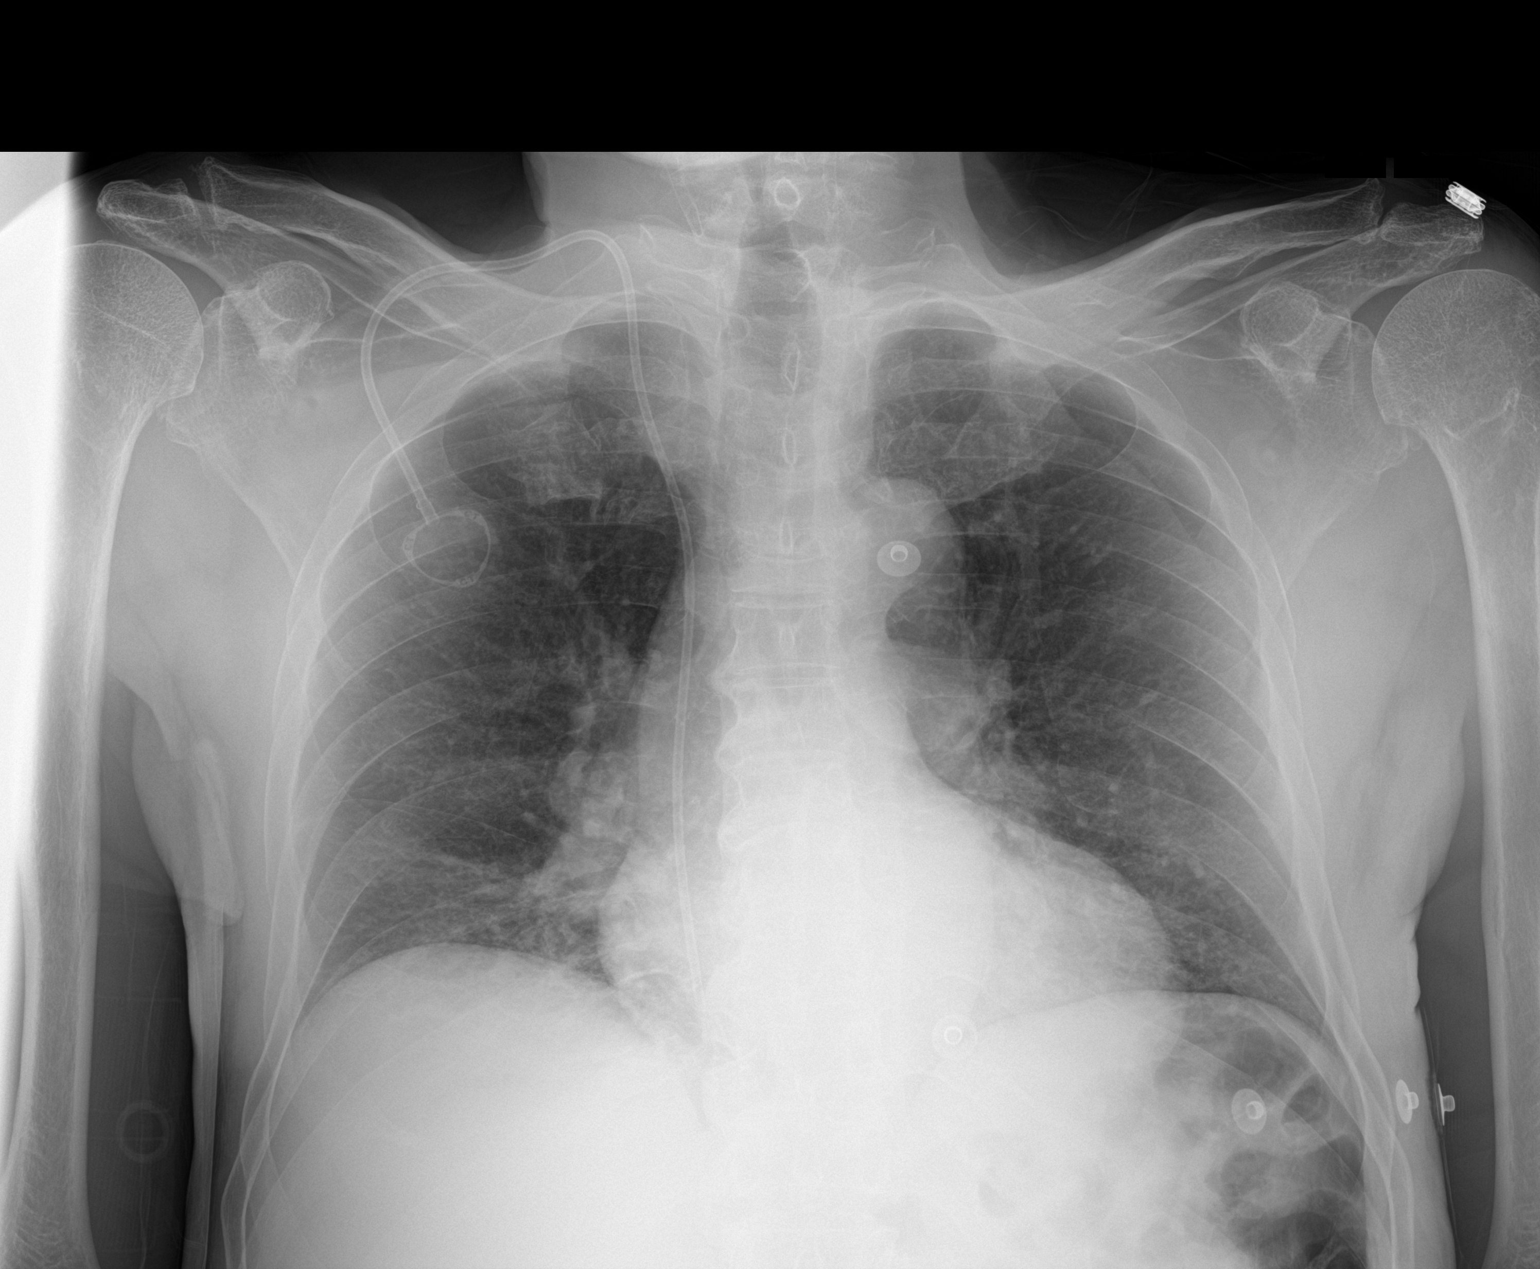

[1 of 1 positions shown; findings below may reference images not displayed]

FINDINGS: The right-sided Port-A-Cath is unchanged in positioning. There is a
new hazy airspace opacity at the right lung base. There is no
pneumothorax. No definite pleural effusion. The heart size is
stable. There is no acute osseous abnormality.
IMPRESSION: 1. New hazy airspace opacity at the right lung base, suspicious for
pneumonia or atelectasis.
2. Stable positioning of the right-sided Port-A-Cath.

## 2019-09-11 IMAGING — MR MR HEAD W/O CM
6 of 10 series · 31 of 48 positions shown · non-contrast
Comparison: CT head [DATE].  MRI head [DATE]

CLINICAL DATA: Seizure with abnormal neuro exam

EXAM:
MRI HEAD WITHOUT CONTRAST
TECHNIQUE: Multiplanar, multiecho pulse sequences of the brain and surrounding
structures were obtained without intravenous contrast.

[Series 5: DWI · axial · 3.0mm · 1.36mm/px · z∈[-69,+69]mm · 10 of 96 slices shown (1 of 2)]
[im 1/96]
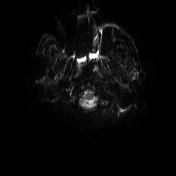
[im 11/96]
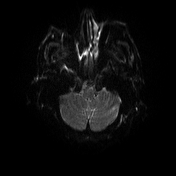
[im 22/96]
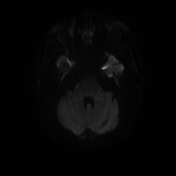
[im 32/96]
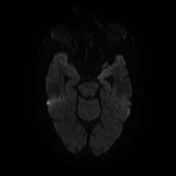
[im 43/96]
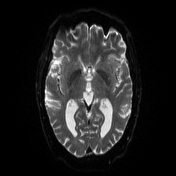
[im 53/96]
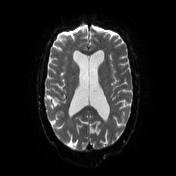
[im 64/96]
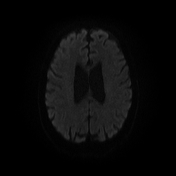
[im 74/96]
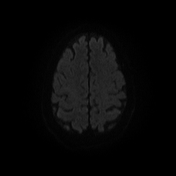
[im 85/96]
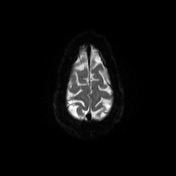
[im 96/96]
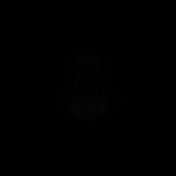

[Series 6: DWI · axial · 3.0mm · 1.36mm/px · z∈[-69,+69]mm · 5 of 48 slices shown (2 of 2)]
[im 1/48]
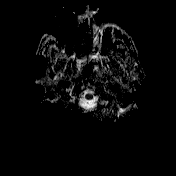
[im 12/48]
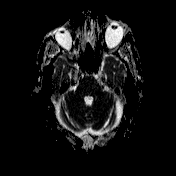
[im 24/48]
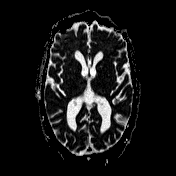
[im 36/48]
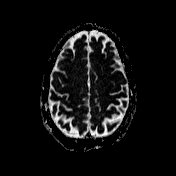
[im 48/48]
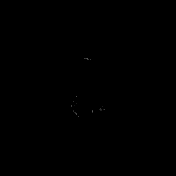

[Series 7: T1 · sagittal · 5.0mm · 0.75mm/px · 2 of 23 slices shown]
[im 1/23]
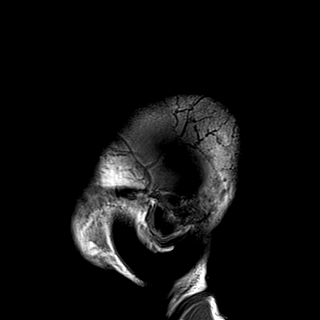
[im 23/23]
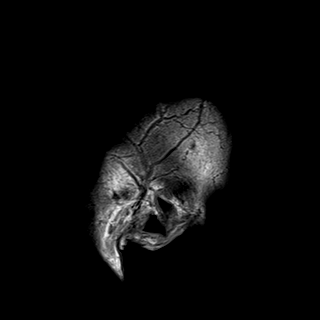

[Series 8: T2 · axial · 5.0mm · 0.62mm/px · z∈[-64,+75]mm · 3 of 23 slices shown]
[im 1/23]
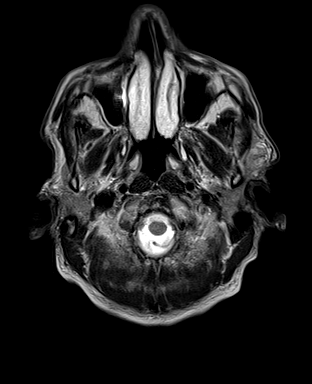
[im 12/23]
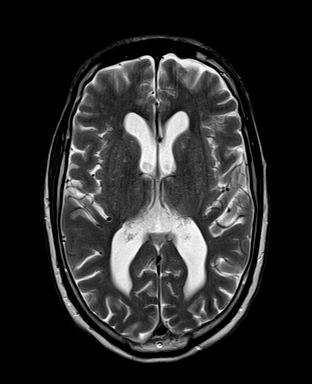
[im 23/23]
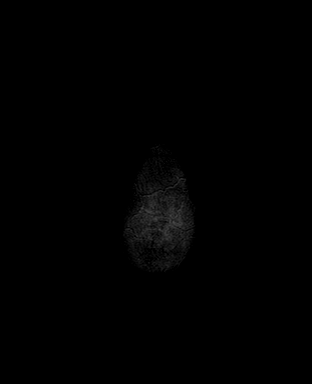

[Series 9: mip_images(sw) · axial · 24.0mm · 0.75mm/px · z∈[-53,+64]mm · 5 of 41 slices shown]
[im 1/41]
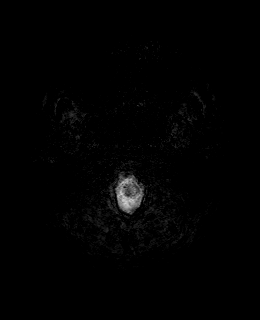
[im 11/41]
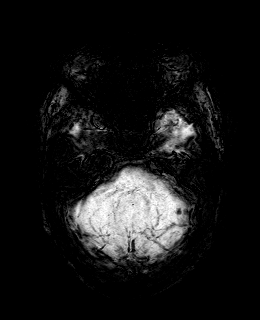
[im 21/41]
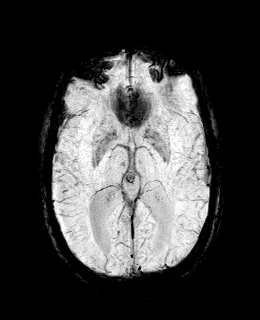
[im 31/41]
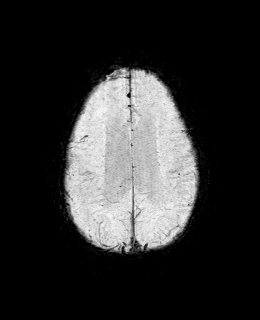
[im 41/41]
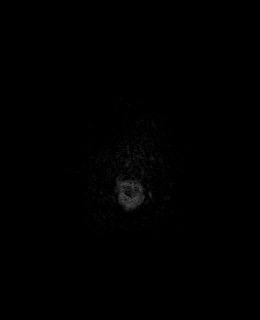

[Series 10: swi_images · axial · 3.0mm · 0.75mm/px · z∈[-63,+74]mm · 6 of 48 slices shown]
[im 1/48]
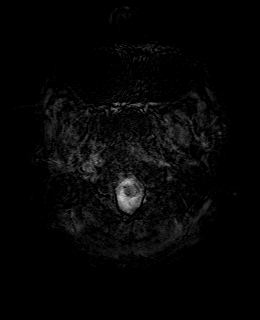
[im 10/48]
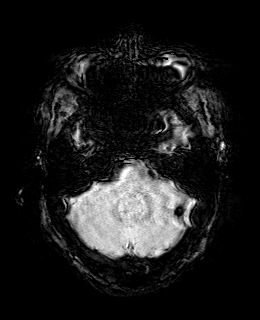
[im 19/48]
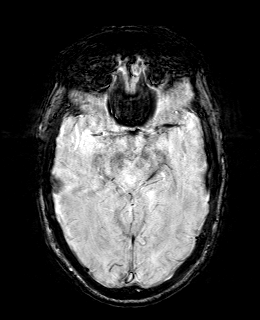
[im 29/48]
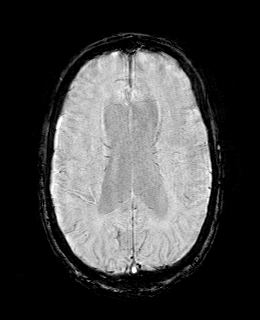
[im 38/48]
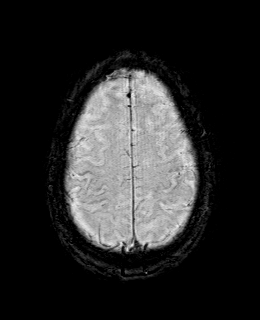
[im 48/48]
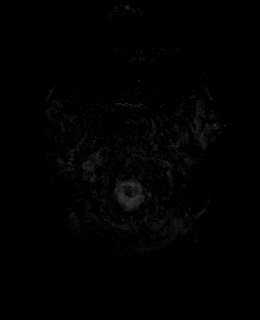

[31 of 48 positions shown; findings below may reference images not displayed]

FINDINGS: Brain: Limited study. The patient was not able to complete all
sequences. Images obtained are of diagnostic quality.

Negative for acute infarct. Mild ventricular enlargement is stable
from prior studies and may be due to atrophy. Negative for
hemorrhage or mass.

Vascular: Normal arterial flow voids

Skull and upper cervical spine: No focal skeletal lesion.

Sinuses/Orbits: Mild mucosal edema paranasal sinuses. Negative orbit

Other: None
IMPRESSION: Incomplete study. Images obtained reveal no acute abnormality.
Negative for acute infarct.

Atrophy and mild ventricular enlargement stable from prior studies.

## 2019-09-11 MED ORDER — VANCOMYCIN HCL 1250 MG/250ML IV SOLN
1250.0000 mg | Freq: Once | INTRAVENOUS | Status: AC
  Administered 2019-09-11: 1250 mg via INTRAVENOUS
  Filled 2019-09-11: qty 250

## 2019-09-11 MED ORDER — LEVETIRACETAM IN NACL 1000 MG/100ML IV SOLN
1000.0000 mg | Freq: Two times a day (BID) | INTRAVENOUS | Status: DC
  Administered 2019-09-11 – 2019-09-14 (×6): 1000 mg via INTRAVENOUS
  Filled 2019-09-11 (×10): qty 100

## 2019-09-11 MED ORDER — SODIUM CHLORIDE 0.9 % IV SOLN
2.0000 g | Freq: Two times a day (BID) | INTRAVENOUS | Status: DC
  Administered 2019-09-11 (×2): 2 g via INTRAVENOUS
  Filled 2019-09-11 (×2): qty 20

## 2019-09-11 MED ORDER — ACETAMINOPHEN 325 MG PO TABS
650.0000 mg | ORAL_TABLET | Freq: Four times a day (QID) | ORAL | Status: DC | PRN
  Administered 2019-09-13: 650 mg via ORAL
  Filled 2019-09-11: qty 2

## 2019-09-11 MED ORDER — ONDANSETRON HCL 4 MG/2ML IJ SOLN: 4.0000 mg | Freq: Four times a day (QID) | INTRAMUSCULAR | Status: DC | PRN

## 2019-09-11 MED ORDER — LORAZEPAM 2 MG/ML IJ SOLN
1.0000 mg | Freq: Once | INTRAMUSCULAR | Status: DC
  Filled 2019-09-11: qty 1

## 2019-09-11 MED ORDER — VANCOMYCIN HCL 750 MG/150ML IV SOLN
750.0000 mg | Freq: Two times a day (BID) | INTRAVENOUS | Status: DC
  Administered 2019-09-12: 750 mg via INTRAVENOUS
  Filled 2019-09-11: qty 150

## 2019-09-11 MED ORDER — ONDANSETRON HCL 4 MG PO TABS: 4.0000 mg | ORAL_TABLET | Freq: Four times a day (QID) | ORAL | Status: DC | PRN

## 2019-09-11 MED ORDER — VANCOMYCIN HCL IN DEXTROSE 1-5 GM/200ML-% IV SOLN: 1000.0000 mg | Freq: Once | INTRAVENOUS | Status: DC

## 2019-09-11 MED ORDER — ACETAMINOPHEN 650 MG RE SUPP: 650.0000 mg | Freq: Four times a day (QID) | RECTAL | Status: DC | PRN

## 2019-09-11 MED ORDER — SODIUM CHLORIDE 0.9 % IV SOLN: 2.0000 g | Freq: Once | INTRAVENOUS | Status: DC

## 2019-09-11 MED ORDER — DEXTROSE 5 % IV SOLN
10.0000 mg/kg | Freq: Three times a day (TID) | INTRAVENOUS | Status: DC
  Administered 2019-09-11 – 2019-09-12 (×3): 610 mg via INTRAVENOUS
  Filled 2019-09-11 (×3): qty 12.2

## 2019-09-11 MED ORDER — MIDAZOLAM HCL 2 MG/2ML IJ SOLN
2.0000 mg | Freq: Once | INTRAMUSCULAR | Status: AC
  Administered 2019-09-11: 2 mg via INTRAVENOUS
  Filled 2019-09-11: qty 2

## 2019-09-11 MED ORDER — LACTATED RINGERS IV SOLN: INTRAVENOUS | Status: DC

## 2019-09-11 NOTE — Progress Notes (Signed)
EEG completed, results pending. 

## 2019-09-11 NOTE — ED Notes (Signed)
Assumed care of patient at this time, nad noted, sr up x2, bed locked and low, call bell w/I reach.  Will continue to monitor.  Family member at the bedside.  

## 2019-09-11 NOTE — ED Provider Notes (Signed)
[facility] DEPT Provider Note   CSN: [id number] Arrival date & time: 09/11/19  0932     History Chief Complaint  Patient presents with  . Altered Mental Status    Carlos [name] is a 74 y.o. male.  HPI     74 year old male comes in a chief complaint of altered mental status. Level 5 caveat for mental status changes.  According to EMS, patient was combative and agitated at home.  He was confused and the home health nurse called 911.  Patient required 2.5 mg of Haldol x2 and physical restraints.  He was trying to pull off his port.  Patient has history of cancer and is undergoing chemotherapy.  I spoke with patient's sister who drove from [place].  She reports that at baseline patient is AO x3 and works at the [employer].  Patient currently is confused in her opinion.  Patient has no complaints from his side.  He has no headaches, neck pain, abdominal pain, chest pain, shortness of breath, cough.  He thinks he has had his home.  He is oriented to self and time.   Past Medical History:  Diagnosis Date  . Anxiety   . Benign prostatic hyperplasia 03/30/2013   10/1 IMO update  . Cancer ([place])   . 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 Acute encephalopathy 09/11/2019  . Fever 09/11/2019  . Port-A-Cath in place 09/11/2019  . Rectal adenocarcinoma ([place]) 06/18/2019  . 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 Benign prostatic hyperplasia 03/30/2013    Past Surgical History:  Procedure Laterality Date  . PORTACATH PLACEMENT N/A 07/01/2019   Procedure: PORT ULTRASOUND GUIDED PLACEMENT;  Surgeon: Alphonsa Overall, MD;  Location: [facility] ORS;  Service: General;  Laterality: N/A;  . PROSTATE SURGERY  2004  . TONSILECTOMY, ADENOIDECTOMY, BILATERAL MYRINGOTOMY AND TUBES         Family History  Problem Relation Age of Onset  . Osteoporosis Mother   .  Dementia Mother   . Diabetes Mother   . Brain cancer Father     Social History   Tobacco Use  . Smoking status: Never Smoker  . Smokeless tobacco: Never Used  Vaping Use  . Vaping Use: Never used  Substance Use Topics  . Alcohol use: No  . Drug use: No    Home Medications Prior to Admission medications   Medication Sig Start Date End Date Taking? Authorizing Provider  acetaminophen (TYLENOL) 325 MG tablet Take 650 mg by mouth every 6 (six) hours as needed for moderate pain (pain).  Patient not taking: Reported on 07/30/2019    [provider]  lidocaine-prilocaine (EMLA) cream Apply to affected area once Patient not taking: Reported on 07/30/2019 06/26/19   Truitt Merle, MD  Misc Natural Products (FOCUSED MIND PO) Take 1 capsule by mouth daily. [place]aklee Mindworks Patient not taking: Reported on 07/30/2019    [provider]  Multiple Vitamins-Minerals (MULTIVITAMIN WITH MINERALS) tablet Take 1 tablet by mouth daily. Centrum Silver Patient not taking: Reported on 07/30/2019    [provider]  ondansetron (ZOFRAN) 8 MG tablet Take 1 tablet (8 mg total) by mouth 2 (two) times daily as needed for refractory nausea / vomiting. Start on day 3 after chemotherapy. Patient not taking: Reported on 07/30/2019 06/26/19   Truitt Merle, MD  prochlorperazine (COMPAZINE) 10 MG tablet Take 1 tablet (10 mg total) by mouth every  6 (six) hours as needed (Nausea or vomiting). Patient not taking: Reported on 07/30/2019 06/26/19   Truitt Merle, MD  traMADol (ULTRAM) 50 MG tablet Take 1 tablet (50 mg total) by mouth every 6 (six) hours as needed for moderate pain. Patient not taking: Reported on 07/30/2019 07/01/19 06/30/20  Alphonsa Overall, MD    Allergies    Patient has no known allergies.  Review of Systems   Review of Systems  Unable to perform ROS: Mental status change  Skin: Negative for rash.    Physical Exam Updated Vital Signs BP 133/74 (BP Location: Right Arm)   Pulse 69   Temp  (!) 100.5 F (38.1 C) (Rectal)   Resp 18   SpO2 100%   Physical Exam Vitals and nursing note reviewed.  Constitutional:      Appearance: He is well-developed.     Comments: Somnolent  HENT:     Head: Atraumatic.  Eyes:     Extraocular Movements: Extraocular movements intact.     Pupils: Pupils are equal, round, and reactive to light.  Cardiovascular:     Rate and Rhythm: Normal rate.  Pulmonary:     Effort: Pulmonary effort is normal.  Musculoskeletal:     Cervical back: Neck supple.  Skin:    General: Skin is warm.  Neurological:     Mental Status: He is disoriented.     Sensory: No sensory deficit.     Motor: No weakness.     ED Results / Procedures / Treatments   Labs (all labs ordered are listed, but only abnormal results are displayed) Labs Reviewed  COMPREHENSIVE METABOLIC PANEL - Abnormal; Notable for the following components:      Result Value   Glucose, Bld 116 (*)    Calcium 8.8 (*)    AST 42 (*)    All other components within normal limits  CBC WITH DIFFERENTIAL/PLATELET - Abnormal; Notable for the following components:   WBC 12.0 (*)    RBC 4.11 (*)    Hemoglobin 12.3 (*)    HCT 36.7 (*)    Platelets 145 (*)    Neutro Abs 10.2 (*)    Lymphs Abs 0.6 (*)    Monocytes Absolute 1.2 (*)    All other components within normal limits  URINALYSIS, COMPLETE (UACMP) WITH MICROSCOPIC - Abnormal; Notable for the following components:   APPearance CLOUDY (*)    Protein, ur 30 (*)    Bacteria, UA RARE (*)    All other components within normal limits  APTT - Abnormal; Notable for the following components:   aPTT 22 (*)    All other components within normal limits  CBG MONITORING, ED - Abnormal; Notable for the following components:   Glucose-Capillary 113 (*)    All other components within normal limits  SARS CORONAVIRUS 2 BY RT PCR (HOSPITAL ORDER, [facility])  URINE CULTURE  CSF CULTURE  CULTURE, BLOOD (SINGLE)  HSV CULTURE AND  TYPING  PROTIME-INR  AMMONIA  LACTIC ACID, PLASMA  CSF CELL COUNT WITH DIFFERENTIAL  CSF CELL COUNT WITH DIFFERENTIAL  GLUCOSE, CSF  PROTEIN, CSF  HERPES SIMPLEX VIRUS(HSV) DNA BY PCR  CRYPTOCOCCAL ANTIGEN, CSF  LACTIC ACID, PLASMA  PROCALCITONIN    EKG None  Radiology CT Head Wo Contrast  Result Date: 09/11/2019 CLINICAL DATA:  Altered mental status EXAM: CT HEAD WITHOUT CONTRAST TECHNIQUE: Contiguous axial images were obtained from the base of the skull through the vertex without intravenous contrast. COMPARISON:  None. FINDINGS: Brain: Ventricles are mildly prominent with normal appearing sulci. There is no intracranial mass, hemorrhage, extra-axial fluid collection, or midline shift. The brain parenchyma appears unremarkable. No acute infarct demonstrable. Vascular: No hyperdense vessel. There are foci of calcification in each carotid siphon region. Skull: Bony calvarium appears intact. Sinuses/Orbits: There is mucosal thickening in several ethmoid air cells. Visualized orbits appear symmetric bilaterally. Other: Mastoid air cells are clear. There is debris in the right external auditory canal. IMPRESSION: Mild ventricular prominence with normal appearing sulci. Question early communicating hydrocephalus. Brain parenchyma appears unremarkable. No acute infarct. No mass or hemorrhage. Foci of arterial vascular calcification noted. There is mucosal thickening in several ethmoid air cells. There is probable cerumen in the right external auditory canal. Electronically Signed   By: Lowella Grip III M.D.   On: 09/11/2019 10:37    Procedures .Critical Care Performed by: Varney Biles, MD Authorized by: Varney Biles, MD   Critical care provider statement:    Critical care time (minutes):  100   Critical care was necessary to treat or prevent imminent or life-threatening deterioration of the following conditions:  CNS failure or compromise   Critical care was time spent personally  by me on the following activities:  Discussions with consultants, evaluation of patient's response to treatment, examination of patient, ordering and performing treatments and interventions, ordering and review of laboratory studies, ordering and review of radiographic studies, pulse oximetry, re-evaluation of patient's condition, obtaining history from patient or surrogate and review of old charts .Lumbar Puncture  Date/Time: 09/11/2019 3:37 PM Performed by: Varney Biles, MD Authorized by: Varney Biles, MD   Consent:    Consent obtained:  Written   Consent given by:  Patient   Alternatives discussed:  No treatment Pre-procedure details:    Procedure purpose:  Diagnostic   Preparation: Patient was prepped and draped in usual sterile fashion   Sedation:    Sedation type:  Anxiolysis Anesthesia (see MAR for exact dosages):    Anesthesia method:  Local infiltration   Local anesthetic:  Lidocaine 2% WITH epi Procedure details:    Lumbar space:  L4-L5 interspace   Patient position:  L lateral decubitus   Needle gauge:  18   Ultrasound guidance: no     Number of attempts:  1   Opening pressure (cm H2O):  15   Fluid appearance:  Clear   Tubes of fluid:  4   Total volume (ml):  15 Post-procedure:    Puncture site:  Adhesive bandage applied and direct pressure applied   Patient tolerance of procedure:  Tolerated well, no immediate complications   (including critical care time)  Medications Ordered in ED Medications  vancomycin (VANCOREADY) IVPB 1250 mg/250 mL (has no administration in time range)  cefTRIAXone (ROCEPHIN) 2 g in sodium chloride 0.9 % 100 mL IVPB (2 g Intravenous New Bag/Given (Non-Interop) 09/11/19 1412)  vancomycin (VANCOREADY) IVPB 750 mg/150 mL (has no administration in time range)  lactated ringers infusion (has no administration in time range)  acetaminophen (TYLENOL) tablet 650 mg (has no administration in time range)    Or  acetaminophen (TYLENOL) suppository  650 mg (has no administration in time range)  ondansetron (ZOFRAN) tablet 4 mg (has no administration in time range)    Or  ondansetron (ZOFRAN) injection 4 mg (has no administration in time range)  midazolam (VERSED) injection 2 mg (2 mg Intravenous Given 09/11/19 1447)    ED Course  I have reviewed the triage vital signs and  the nursing notes.  Pertinent labs & imaging results that were available during my care of the patient were reviewed by me and considered in my medical decision making (see chart for details).    MDM Rules/Calculators/A&P                          74 year old comes in a chief complaint of confusion.  He was given 2.5 mg of Haldol into 2 prior to ED arrival.  Patient was somnolent during my assessment.  Ultimately ordered a rectal temp and he has a low-grade fever.  CT scan of the head, basic lab work-up is overall reassuring.  With a low-grade fever, chemotherapy and family confirming that patient is confused more than baseline, we proceeded with lumbar puncture.  Patient will also be admitted to the hospital.  He has received ceftriaxone and vancomycin.  I have discussed the need for admitting team to follow-up on LP results and adding ampicillin and dexamethasone if there is evidence of meningitis on LP results.  Family has been updated with the plan.  Final Clinical Impression(s) / ED Diagnoses Final diagnoses:  Encephalopathy    Rx / DC Orders ED Discharge Orders    None       Varney Biles, MD 09/11/19 [phone]

## 2019-09-11 NOTE — ED Notes (Signed)
Patient is resting comfortably. 

## 2019-09-11 NOTE — Progress Notes (Signed)
Pharmacy Antibiotic Note  Carlos [name] is a 74 y.o. male admitted on 09/11/2019 with vancomycin.  Pharmacy has been consulted for meningitis dosing.  Plan: Vancomycin 1250 mg IV x1, then 750 mg IV q12h  ( target trough 15-20)  Ceftriaxone 2 gr IV q12h       Temp (24hrs), Avg:100.5 F (38.1 C), Min:100.5 F (38.1 C), Max:100.5 F (38.1 C)  Recent Labs  Lab 09/10/19 0905 09/11/19 0953  WBC 5.4 12.0*  CREATININE 0.84 0.89    Estimated Creatinine Clearance: 62.8 mL/min (by C-G formula based on SCr of 0.89 mg/dL).    No Known Allergies  Antimicrobials this admission: 8/6 ceftriaxone >>  8/6 vancomycin >>   Dose adjustments this admission:   Microbiology results: 8/6 BCx:  8/6 UCx:   8/6 CSF culture:  HSV PCR in CSF:  Cryptococcal antigen:     Thank you for allowing pharmacy to be a part of this patient's care.  Royetta Asal, PharmD, BCPS 09/11/2019 2:14 PM

## 2019-09-11 NOTE — H&P (Addendum)
[facility] History and Physical  Carlos [name] QIW:[id number] DOB: Jan 25, 1946 DOA: 09/11/2019   PCP: [name], MD  Specialists: Dr. Burr Medico is his medical oncologist  Chief Complaint: Altered mental status  HPI: Carlos [name] is a 74 y.o. male with a past medical history of rectal adenocarcinoma currently getting FOLFOX chemotherapy. He received the 6th cycle yesterday. 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 Does not have any other health problems except for history of mild cognitive impairment. According to reports provided by the ED provider a home health agency went to check on patient's mother and when they got there they found that the patient was quite agitated. He was combative and confused. He was trying to pull out his port. So EMS was called. He was given Haldol and other sedative agents and he was brought into the hospital. Patient answering in monosyllables and does not provide much information at this time. So history is very limited.  Evaluation in the emergency department revealed that patient had temperature of 100.5, he was confused, blood work showed mild leukocytosis. No obvious reason for his encephalopathy was found. Lumbar puncture was subsequently performed. He will need hospitalization for further evaluation.  Home Medications: Prior to Admission medications   Medication Sig Start Date End Date Taking? Authorizing Provider  acetaminophen (TYLENOL) 325 MG tablet Take 650 mg by mouth every 6 (six) hours as needed for moderate pain (pain).  Patient not taking: Reported on 07/30/2019    [provider]  lidocaine-prilocaine (EMLA) cream Apply to affected area once Patient not taking: Reported on 07/30/2019 06/26/19   Truitt Merle, MD  Misc Natural Products (FOCUSED MIND PO) Take 1 capsule by mouth daily. Shaklee Mindworks Patient not taking: Reported on 07/30/2019    [provider]  Multiple Vitamins-Minerals (MULTIVITAMIN WITH MINERALS) tablet  Take 1 tablet by mouth daily. Centrum Silver Patient not taking: Reported on 07/30/2019    [provider]  ondansetron (ZOFRAN) 8 MG tablet Take 1 tablet (8 mg total) by mouth 2 (two) times daily as needed for refractory nausea / vomiting. Start on day 3 after chemotherapy. Patient not taking: Reported on 07/30/2019 06/26/19   Truitt Merle, MD  prochlorperazine (COMPAZINE) 10 MG tablet Take 1 tablet (10 mg total) by mouth every 6 (six) hours as needed (Nausea or vomiting). Patient not taking: Reported on 07/30/2019 06/26/19   Truitt Merle, MD  traMADol (ULTRAM) 50 MG tablet Take 1 tablet (50 mg total) by mouth every 6 (six) hours as needed for moderate pain. Patient not taking: Reported on 07/30/2019 07/01/19 06/30/20  Alphonsa Overall, MD    Allergies: No Known Allergies  Past Medical History: Past Medical History:  Diagnosis Date  . Anxiety   . Benign prostatic hyperplasia 03/30/2013   10/1 IMO update  . Cancer ([place])   . 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/23/2019    Past Surgical History:  Procedure Laterality Date  . PORTACATH PLACEMENT N/A 07/01/2019   Procedure: PORT ULTRASOUND GUIDED PLACEMENT;  Surgeon: Alphonsa Overall, MD;  Location: [facility];  Service: General;  Laterality: N/A;  . PROSTATE SURGERY  2004  . TONSILECTOMY, ADENOIDECTOMY, BILATERAL MYRINGOTOMY AND TUBES      Social History: Patient lives with his family. No other information is available.  Family History:  Family History  Problem Relation Age of Onset  . Osteoporosis Mother   . Dementia Mother   . Diabetes Mother   . Brain cancer Father      Review  of Systems -unable to do due to his confusion  Physical Examination  Vitals:   09/11/19 0946 09/11/19 1124 09/11/19 1211 09/11/19 1458  BP: 138/73  134/67 133/74  Pulse: 91  80 69  Resp: (!) 21  16 18   Temp:  (!) 100.5 F (38.1 C)    TempSrc:  Rectal    SpO2: 99%  100% 100%    BP 133/74 (BP Location: Right Arm)   Pulse 69   Temp (!) 100.5  F (38.1 C) (Rectal)   Resp 18   SpO2 100%   General appearance: Eyes closed but does respond. Opens his eyes after much persuasion. Very distracted. In no distress. Head: Normocephalic, without obvious abnormality, atraumatic Eyes: conjunctivae/corneas clear. PERRL, EOM's intact.  Throat: lips, mucosa, and tongue normal; teeth and gums normal Neck: no adenopathy, no carotid bruit, no JVD, supple, symmetrical, trachea midline, thyroid not enlarged, symmetric, no tenderness/mass/nodules and Neck is soft and supple Resp: clear to auscultation bilaterally Cardio: regular rate and rhythm, S1, S2 normal, no murmur, click, rub or gallop GI: soft, non-tender; bowel sounds normal; no masses,  no organomegaly Extremities: extremities normal, atraumatic, no cyanosis or edema Pulses: 2+ and symmetric Skin: Skin color, texture, turgor normal. No rashes or lesions Lymph nodes: Cervical, supraclavicular, and axillary nodes normal. Neurologic: Closed eyes noted. Does respond and answers in monosyllables. Moving all his extremities.    Labs on Admission: I have personally reviewed following labs and imaging studies  CBC: Recent Labs  Lab 09/10/19 0905 09/11/19 0953  WBC 5.4 12.0*  NEUTROABS 2.2 10.2*  HGB 12.5* 12.3*  HCT 37.6* 36.7*  MCV 87.2 89.3  PLT 172 939*   Basic Metabolic Panel: Recent Labs  Lab 09/10/19 0905 09/11/19 0953  NA 141 138  K 3.9 3.6  CL 107 105  CO2 25 23  GLUCOSE 114* 116*  BUN 12 16  CREATININE 0.84 0.89  CALCIUM 9.6 8.8*   GFR: Estimated Creatinine Clearance: 62.8 mL/min (by C-G formula based on SCr of 0.89 mg/dL). Liver Function Tests: Recent Labs  Lab 09/10/19 0905 09/11/19 0953  AST 31 42*  ALT 20 31  ALKPHOS 82 67  BILITOT 0.5 0.5  PROT 6.8 6.8  ALBUMIN 3.9 4.0    Recent Labs  Lab 09/11/19 0954  AMMONIA 25   Coagulation Profile: Recent Labs  Lab 09/11/19 0953  INR 1.0   CBG: Recent Labs  Lab 09/11/19 0943  GLUCAP 113*      Radiological Exams on Admission: CT Head Wo Contrast  Result Date: 09/11/2019 CLINICAL DATA:  Altered mental status EXAM: CT HEAD WITHOUT CONTRAST TECHNIQUE: Contiguous axial images were obtained from the base of the skull through the vertex without intravenous contrast. COMPARISON:  None. FINDINGS: Brain: Ventricles are mildly prominent with normal appearing sulci. There is no intracranial mass, hemorrhage, extra-axial fluid collection, or midline shift. The brain parenchyma appears unremarkable. No acute infarct demonstrable. Vascular: No hyperdense vessel. There are foci of calcification in each carotid siphon region. Skull: Bony calvarium appears intact. Sinuses/Orbits: There is mucosal thickening in several ethmoid air cells. Visualized orbits appear symmetric bilaterally. Other: Mastoid air cells are clear. There is debris in the right external auditory canal. IMPRESSION: Mild ventricular prominence with normal appearing sulci. Question early communicating hydrocephalus. Brain parenchyma appears unremarkable. No acute infarct. No mass or hemorrhage. Foci of arterial vascular calcification noted. There is mucosal thickening in several ethmoid air cells. There is probable cerumen in the right external auditory canal. Electronically Signed  By: Lowella Grip III M.D.   On: 09/11/2019 10:37    Problem List  Principal Problem:   Acute encephalopathy Active Problems:   Benign prostatic hyperplasia   Mild neurocognitive disorder of unclear etiology   Rectal adenocarcinoma (HCC)   Fever   Port-A-Cath in place   Assessment: This is a 74 year old African-American male who has a history of rectal adenocarcinoma currently getting chemotherapy who comes in with acute confusion. He was combative at home. He had to be given sedative agents before he calmed down. Concern was for CNS infection. He underwent LP. He received the sixth cycle of his FOLFOX chemotherapy yesterday. Medication induced  confusion is another differential. Seizure is another differential.  Plan:  1. Acute delirium: Etiology remains unclear as discussed above. His ammonia level was normal. He does not have any focal neurological deficits. Have low-grade fever but no clear evidence for sepsis at this time. No clear source of infection. Lactic acid level 1.8. EEG will be ordered. CT scan of the head showed nonspecific findings. No acute infarcts noted. If patient's mentation does not improve then MRI will need to be considered. In the meantime patient did undergo lumbar puncture by ED provider. Gram stain does not show any organisms or WBC. Cell counts are pending at this time. Glucose level is 79, protein level is normal. Unlikely to be a bacterial meningitis.  ADDENDUM: EEG reveals potential elliptical density arising from the left more than right frontotemporal region. This could explain patient's presentation. We will start [place]. Proceed with MRI brain. Seizure precautions.  2. Fever with mild leukocytosis: Etiology unclear. UA does not suggest infection. Will do chest x-ray as well. LP was done as discussed above. Follow-up on cultures. Reasonable to continue with ceftriaxone and vancomycin and acyclovir for now.  3. History of rectal adenocarcinoma: Followed by medical oncology, Dr. Burr Medico. Receiving FOLFOX chemotherapy. Last cycle was administered yesterday.  DVT Prophylaxis: Hold off on Lovenox today as he underwent LP. Can be started tomorrow. Code Status: Full code Family Communication: No family noted at bedside Disposition: Hopefully return home when improved Consults called: None at this time Admission Status: Status is: Inpatient  Remains inpatient appropriate because:Altered mental status and IV treatments appropriate due to intensity of illness or inability to take PO   Dispo: The patient is from: Home              Anticipated d/c is to: Home              Anticipated d/c date is: 3 days               Patient currently is not medically stable to d/c.   Severity of Illness: The appropriate patient status for this patient is INPATIENT. Inpatient status is judged to be reasonable and necessary in order to provide the required intensity of service to ensure the patient's safety. The patient's presenting symptoms, physical exam findings, and initial radiographic and laboratory data in the context of their chronic comorbidities is felt to place them at high risk for further clinical deterioration. Furthermore, it is not anticipated that the patient will be medically stable for discharge from the hospital within 2 midnights of admission. The following factors support the patient status of inpatient.   " The patient's presenting symptoms include altered mental status. " The worrisome physical exam findings include fever. " The initial radiographic and laboratory data are worrisome because of possible infection. " The chronic co-morbidities include rectal cancer.   *  I certify that at the point of admission it is my clinical judgment that the patient will require inpatient hospital care spanning beyond 2 midnights from the point of admission due to high intensity of service, high risk for further deterioration and high frequency of surveillance required.*  Further management decisions will depend on results of further testing and patient's response to treatment.   Fermin Yan [employer]  [facility] operational officer on [email or web address]  09/11/2019, 5:02 PM

## 2019-09-11 NOTE — Telephone Encounter (Signed)
central radiology will contact pt per 8/5 los.

## 2019-09-11 NOTE — Procedures (Signed)
Patient Name: Carlos [name]  MRN: [id number]  Epilepsy Attending: Lora Havens  Referring Physician/Provider: Dr Bonnielee Haff Date: 09/11/2019 Duration: 25.13 mins  Patient history: 74yo M with ams. EEG to evaluate for seizure  Level of alertness: Awake, asleep  AEDs during EEG study: None  Technical aspects: This EEG study was done with scalp electrodes positioned according to the 10-20 International system of electrode placement. Electrical activity was acquired at a sampling rate of 500Hz  and reviewed with a high frequency filter of 70Hz  and a low frequency filter of 1Hz . EEG data were recorded continuously and digitally stored.   Description: The posterior dominant rhythm consists of 8 Hz activity of moderate voltage (25-35 uV) seen predominantly in posterior head regions, symmetric and reactive to eye opening and eye closing. Sleep was characterized by vertex waves, sleep spindles (12 to 14 Hz), maximal frontocentral region. Intermittent generalized 3 to 6 Hz theta-delta slowing as well as bilateral independent left> right frontotemporal spikes were noted.  Hyperventilation and photic stimulation were not performed.     ABNORMALITY -Intermittent slow, generalized -Spike, left and right frontotemporal  IMPRESSION: This study showed evidence of potential epileptogenicity arising from left more than right frontotemporal region as well as mild diffuse encephalopathy, nonspecific etiology. No seizures were seen throughout the recording.  Galina Haddox Barbra Sarks

## 2019-09-11 NOTE — ED Triage Notes (Signed)
Per EMS pt was combative on scene with [employer]. They were called out for AMS. Pt received 5 of Versed and 5 of Haldol. Pt sleeping during triage.

## 2019-09-11 NOTE — Progress Notes (Addendum)
Carlos [name]   DOB:29-Oct-1945   UM#:[id number]   VQM#:[id number]  Oncology follow up   Subjective: Patient is well-known to me, under my care for his rectal cancer treatment.  He is receiving neoadjuvant chemotherapy FOLFOX, and that was last seen by me in the office yesterday.  He presented with acute mental status change, agitation, combative and confused.  He received Haldol and was brought to [facility] emergency room.  In the ED, he was found to have low grade fever, and LP was done. He was very sleepy when I saw him in the ED, arousable, but disoriented.  His sister was at the bedside.   Objective:  Vitals:   09/11/19 1919 09/11/19 1924  BP: 129/89 129/89  Pulse: 61   Resp: (!) 23 20  Temp:    SpO2: 100% 100%    There is no height or weight on file to calculate BMI.  Intake/Output Summary (Last 24 hours) at 09/11/2019 1955 Last data filed at 09/11/2019 1125 Gross per 24 hour  Intake --  Output 450 ml  Net -450 ml     Sclerae unicteric  Lungs clear -- no rales or rhonchi  Heart regular rate and rhythm  Abdomen benign  MSK no focal spinal tenderness, no peripheral edema    CBG (last 3)  Recent Labs    09/11/19 0943  GLUCAP 113*     Labs:  Urine Studies No results for input(s): UHGB, CRYS in the last 72 hours.  Invalid input(s): UACOL, UAPR, USPG, UPH, UTP, UGL, UKET, UBIL, UNIT, UROB, ULEU, UEPI, UWBC, URBC, UBAC, CAST, UCOM, BILUA  Basic Metabolic Panel: Recent Labs  Lab 09/10/19 0905 09/11/19 0953  NA 141 138  K 3.9 3.6  CL 107 105  CO2 25 23  GLUCOSE 114* 116*  BUN 12 16  CREATININE 0.84 0.89  CALCIUM 9.6 8.8*   GFR Estimated Creatinine Clearance: 62.8 mL/min (by C-G formula based on SCr of 0.89 mg/dL). Liver Function Tests: Recent Labs  Lab 09/10/19 0905 09/11/19 0953  AST 31 42*  ALT 20 31  ALKPHOS 82 67  BILITOT 0.5 0.5  PROT 6.8 6.8  ALBUMIN 3.9 4.0   No results for input(s): LIPASE, AMYLASE in the last 168 hours. Recent Labs  Lab  09/11/19 0954  AMMONIA 25   Coagulation profile Recent Labs  Lab 09/11/19 0953  INR 1.0    CBC: Recent Labs  Lab 09/10/19 0905 09/11/19 0953  WBC 5.4 12.0*  NEUTROABS 2.2 10.2*  HGB 12.5* 12.3*  HCT 37.6* 36.7*  MCV 87.2 89.3  PLT 172 145*   Cardiac Enzymes: No results for input(s): CKTOTAL, CKMB, CKMBINDEX, TROPONINI in the last 168 hours. BNP: Invalid input(s): POCBNP CBG: Recent Labs  Lab 09/11/19 0943  GLUCAP 113*   D-Dimer No results for input(s): DDIMER in the last 72 hours. Hgb A1c No results for input(s): HGBA1C in the last 72 hours. Lipid Profile No results for input(s): CHOL, HDL, LDLCALC, TRIG, CHOLHDL, LDLDIRECT in the last 72 hours. Thyroid function studies No results for input(s): TSH, T4TOTAL, T3FREE, THYROIDAB in the last 72 hours.  Invalid input(s): FREET3 Anemia work up No results for input(s): VITAMINB12, FOLATE, FERRITIN, TIBC, IRON, RETICCTPCT in the last 72 hours. Microbiology Recent Results (from the past 240 hour(s))  SARS Coronavirus 2 by RT PCR (hospital order, performed in [facility] hospital lab) Nasopharyngeal Nasopharyngeal Swab     Status: None   Collection Time: 09/11/19  1:47 PM   Specimen: Nasopharyngeal Swab  Result Value Ref Range Status   SARS Coronavirus 2 NEGATIVE NEGATIVE Final    Comment: (NOTE) SARS-CoV-2 target nucleic acids are NOT DETECTED.  The SARS-CoV-2 RNA is generally detectable in upper and lower respiratory specimens during the acute phase of infection. The lowest concentration of SARS-CoV-2 viral copies this assay can detect is 250 copies / mL. A negative result does not preclude SARS-CoV-2 infection and should not be used as the sole basis for treatment or other patient management decisions.  A negative result may occur with improper specimen collection / handling, submission of specimen other than nasopharyngeal swab, presence of viral mutation(s) within the areas targeted by this assay, and  inadequate number of viral copies (<250 copies / mL). A negative result must be combined with clinical observations, patient history, and epidemiological information.  Fact Sheet for Patients:   StrictlyIdeas.no  Fact Sheet for Healthcare Providers: BankingDealers.co.za  This test is not yet approved or  cleared by the [place] FDA and has been authorized for detection and/or diagnosis of SARS-CoV-2 by FDA under an Emergency Use Authorization (EUA).  This EUA will remain in effect (meaning this test can be used) for the duration of the COVID-19 declaration under Section 564(b)(1) of the Act, 21 U.S.C. section 360bbb-3(b)(1), unless the authorization is terminated or revoked sooner.  Performed at [facility], [place] [address] [place], [place] [address]   CSF culture     Status: None (Preliminary result)   Collection Time: 09/11/19  1:48 PM   Specimen: CSF; Cerebrospinal Fluid  Result Value Ref Range Status   Specimen Description CSF  Final   Special Requests Immunocompromised  Final   Gram Stain   Final    NO WBC SEEN NO ORGANISMS SEEN CYTOSPIN SMEAR Gram Stain Report Called to,Read Back By and Verified With: L.BALDWIN AT 6812 ON 09/11/19 BY N.THOMPSON Performed at [facility], [place] [address], [place], [place] [address]    Culture PENDING  Incomplete   Report Status PENDING  Incomplete      Studies:  CT Head Wo Contrast  Result Date: 09/11/2019 CLINICAL DATA:  Altered mental status EXAM: CT HEAD WITHOUT CONTRAST TECHNIQUE: Contiguous axial images were obtained from the base of the skull through the vertex without intravenous contrast. COMPARISON:  None. FINDINGS: Brain: Ventricles are mildly prominent with normal appearing sulci. There is no intracranial mass, hemorrhage, extra-axial fluid collection, or midline shift. The brain parenchyma appears unremarkable. No acute infarct  demonstrable. Vascular: No hyperdense vessel. There are foci of calcification in each carotid siphon region. Skull: Bony calvarium appears intact. Sinuses/Orbits: There is mucosal thickening in several ethmoid air cells. Visualized orbits appear symmetric bilaterally. Other: Mastoid air cells are clear. There is debris in the right external auditory canal. IMPRESSION: Mild ventricular prominence with normal appearing sulci. Question early communicating hydrocephalus. Brain parenchyma appears unremarkable. No acute infarct. No mass or hemorrhage. Foci of arterial vascular calcification noted. There is mucosal thickening in several ethmoid air cells. There is probable cerumen in the right external auditory canal. Electronically Signed   By: Lowella Grip III M.D.   On: 09/11/2019 10:37   MR BRAIN WO CONTRAST  Result Date: 09/11/2019 CLINICAL DATA:  Seizure with abnormal neuro exam EXAM: MRI HEAD WITHOUT CONTRAST TECHNIQUE: Multiplanar, multiecho pulse sequences of the brain and surrounding structures were obtained without intravenous contrast. COMPARISON:  CT head 09/11/2019.  MRI head 04/03/2019 FINDINGS: Brain: Limited study. The patient was not able to complete all sequences. Images  obtained are of diagnostic quality. Negative for acute infarct. Mild ventricular enlargement is stable from prior studies and may be due to atrophy. Negative for hemorrhage or mass. Vascular: Normal arterial flow voids Skull and upper cervical spine: No focal skeletal lesion. Sinuses/Orbits: Mild mucosal edema paranasal sinuses. Negative orbit Other: None IMPRESSION: Incomplete study. Images obtained reveal no acute abnormality. Negative for acute infarct. Atrophy and mild ventricular enlargement stable from prior studies. Electronically Signed   By: Franchot Gallo M.D.   On: 09/11/2019 19:27   DG CHEST PORT 1 VIEW  Result Date: 09/11/2019 CLINICAL DATA:  Combative.  History of rectal adenocarcinoma. EXAM: PORTABLE CHEST 1  VIEW COMPARISON:  Chest x-ray dated Jul 01, 2019 FINDINGS: The right-sided Port-A-Cath is unchanged in positioning. There is a new hazy airspace opacity at the right lung base. There is no pneumothorax. No definite pleural effusion. The heart size is stable. There is no acute osseous abnormality. IMPRESSION: 1. New hazy airspace opacity at the right lung base, suspicious for pneumonia or atelectasis. 2. Stable positioning of the right-sided Port-A-Cath. Electronically Signed   By: Constance Holster M.D.   On: 09/11/2019 17:58   EEG adult  Result Date: 09/11/2019 Lora Havens, MD     09/11/2019  5:28 PM Patient Name: JEREMI LOSITO MRN: [id number] Epilepsy Attending: Lora Havens Referring Physician/Provider: Dr Bonnielee Haff Date: 09/11/2019 Duration: 25.13 mins Patient history: 74yo M with ams. EEG to evaluate for seizure Level of alertness: Awake, asleep AEDs during EEG study: None Technical aspects: This EEG study was done with scalp electrodes positioned according to the 10-20 International system of electrode placement. Electrical activity was acquired at a sampling rate of 500Hz  and reviewed with a high frequency filter of 70Hz  and a low frequency filter of 1Hz . EEG data were recorded continuously and digitally stored. Description: The posterior dominant rhythm consists of 8 Hz activity of moderate voltage (25-35 uV) seen predominantly in posterior head regions, symmetric and reactive to eye opening and eye closing. Sleep was characterized by vertex waves, sleep spindles (12 to 14 Hz), maximal frontocentral region. Intermittent generalized 3 to 6 Hz theta-delta slowing as well as bilateral independent left> right frontotemporal spikes were noted.  Hyperventilation and photic stimulation were not performed.   ABNORMALITY -Intermittent slow, generalized -Spike, left and right frontotemporal IMPRESSION: This study showed evidence of potential epileptogenicity arising from left more than right  frontotemporal region as well as mild diffuse encephalopathy, nonspecific etiology. No seizures were seen throughout the recording. Lora Havens    Assessment: 74 y.o. male   1.  Acute delirium, possible seizure  2 fever with mild leukocytosis 3.  Stage III rectal adenocarcinoma, currently on neoadjuvant chemotherapy FOLFOX, last dose yesterday 4.  Mild cognitive impairment  Plan:  -I reviewed lab, LP cell counts showed only a few WBC, protein level was normal, this is unlikely meningitis -EEG was abnormal, possible seizure, neurology is being consulted. -His mild leukocytosis could be related to steroids he received before chemotherapy -He was competitive this morning and tried to pour his needle in the port. Chemo was stopped, no need to restart.  -He has been tolerating chemotherapy very well, so far received a total of 6 cycles. -I discussed with his sister at bedside.  We reviewed his chemo agent oxaliplatin has neurotoxicity.  If the other work-up was negative, his acute delirium could be related to oxaliplatin, although this is not a common presentation. -his CT head was negative, I agree with brain  MRI, rule out brian mets  -we will f/u as needed, please call us over the weekend if there is any questions  -I will f/u on Monday if he is still in hospital  -I spoke with Dr. Therese Sarah, MD 09/11/2019  7:55 PM

## 2019-09-12 LAB — IRON AND TIBC
Iron: 126 ug/dL (ref 45–182)
Saturation Ratios: 31 % (ref 17.9–39.5)
TIBC: 404 ug/dL (ref 250–450)
UIBC: 278 ug/dL

## 2019-09-12 LAB — URINE CULTURE: Culture: NO GROWTH

## 2019-09-12 LAB — RETICULOCYTES
Immature Retic Fract: 2.7 % (ref 2.3–15.9)
RBC.: 4.23 MIL/uL (ref 4.22–5.81)
Retic Count, Absolute: 48.6 10*3/uL (ref 19.0–186.0)
Retic Ct Pct: 1.2 % (ref 0.4–3.1)

## 2019-09-12 LAB — COMPREHENSIVE METABOLIC PANEL
ALT: 25 U/L (ref 0–44)
AST: 40 U/L (ref 15–41)
Albumin: 2.9 g/dL — ABNORMAL LOW (ref 3.5–5.0)
Alkaline Phosphatase: 51 U/L (ref 38–126)
Anion gap: 10 (ref 5–15)
BUN: 12 mg/dL (ref 8–23)
CO2: 15 mmol/L — ABNORMAL LOW (ref 22–32)
Calcium: 6.9 mg/dL — ABNORMAL LOW (ref 8.9–10.3)
Chloride: 117 mmol/L — ABNORMAL HIGH (ref 98–111)
Creatinine, Ser: 0.7 mg/dL (ref 0.61–1.24)
GFR calc Af Amer: >60 mL/min (ref >60)
GFR calc non Af Amer: >60 mL/min (ref >60)
Glucose, Bld: 84 mg/dL (ref 70–99)
Potassium: 3 mmol/L — ABNORMAL LOW (ref 3.5–5.1)
Sodium: 142 mmol/L (ref 135–145)
Total Bilirubin: 0.9 mg/dL (ref 0.3–1.2)
Total Protein: 4.8 g/dL — ABNORMAL LOW (ref 6.5–8.1)

## 2019-09-12 LAB — CBC WITH DIFFERENTIAL/PLATELET
Abs Immature Granulocytes: 0.02 10*3/uL (ref 0.00–0.07)
Basophils Absolute: 0 10*3/uL (ref 0.0–0.1)
Basophils Relative: 0 %
Eosinophils Absolute: 0 10*3/uL (ref 0.0–0.5)
Eosinophils Relative: 1 %
HCT: 39 % (ref 39.0–52.0)
Hemoglobin: 12.5 g/dL — ABNORMAL LOW (ref 13.0–17.0)
Immature Granulocytes: 0 %
Lymphocytes Relative: 30 %
Lymphs Abs: 1.8 10*3/uL (ref 0.7–4.0)
MCH: 29.6 pg (ref 26.0–34.0)
MCHC: 32.1 g/dL (ref 30.0–36.0)
MCV: 92.2 fL (ref 80.0–100.0)
Monocytes Absolute: 0.9 10*3/uL (ref 0.1–1.0)
Monocytes Relative: 16 %
Neutro Abs: 3.1 10*3/uL (ref 1.7–7.7)
Neutrophils Relative %: 53 %
Platelets: 118 10*3/uL — ABNORMAL LOW (ref 150–400)
RBC: 4.23 MIL/uL (ref 4.22–5.81)
RDW: 15.9 % — ABNORMAL HIGH (ref 11.5–15.5)
WBC: 5.9 10*3/uL (ref 4.0–10.5)
nRBC: 0 % (ref 0.0–0.2)

## 2019-09-12 LAB — VITAMIN B12: Vitamin B-12: 759 pg/mL (ref 180–914)

## 2019-09-12 LAB — HSV DNA BY PCR (REFERENCE LAB)
HSV 1 DNA: NEGATIVE
HSV 2 DNA: NEGATIVE

## 2019-09-12 LAB — TSH: TSH: 0.784 u[IU]/mL (ref 0.350–4.500)

## 2019-09-12 LAB — FERRITIN: Ferritin: 141 ng/mL (ref 24–336)

## 2019-09-12 LAB — HIV ANTIBODY (ROUTINE TESTING W REFLEX): HIV Screen 4th Generation wRfx: NONREACTIVE

## 2019-09-12 LAB — CBG MONITORING, ED
Glucose-Capillary: 89 mg/dL (ref 70–99)
Glucose-Capillary: 113 mg/dL — ABNORMAL HIGH (ref 70–99)

## 2019-09-12 LAB — FOLATE: Folate: 76.9 ng/mL (ref >5.9)

## 2019-09-12 MED ORDER — POTASSIUM CHLORIDE 10 MEQ/100ML IV SOLN
10.0000 meq | INTRAVENOUS | Status: AC
  Administered 2019-09-12 (×4): 10 meq via INTRAVENOUS
  Filled 2019-09-12 (×4): qty 100

## 2019-09-12 MED ORDER — DEXTROSE-NACL 5-0.45 % IV SOLN: INTRAVENOUS | Status: AC

## 2019-09-12 MED ORDER — POLYVINYL ALCOHOL 1.4 % OP SOLN: 2.0000 [drp] | OPHTHALMIC | Status: DC | PRN

## 2019-09-12 NOTE — Progress Notes (Signed)
PROGRESS NOTE    Carlos [name]  ACZ:[id number] DOB: 1945-03-23 DOA: 09/11/2019 PCP: [name], MD   Brief Narrative:  74-year with history of rectal adenocarcinoma on FOLFOX, mild cognitive impairment admitted for agitation and confusion.  Upon admission he was noted to be confused, febrile, leukocytosis.  CT head was nonspecific, LP performed did not show any evidence of obvious infection.  EEG showed potential epileptic spike therefore started on Keppra.  MRI study was incomplete but overall negative.   Assessment & Plan:   Principal Problem:   Acute encephalopathy Active Problems:   Benign prostatic hyperplasia   Mild neurocognitive disorder of unclear etiology   Rectal adenocarcinoma (HCC)   Fever   Port-A-Cath in place  Fever/leukocytosis/altered mental status -Concerns of possible seizures.  EEG shows epileptic spike, started on Keppra.  MRI study incomplete but overall negative.  CT head negative.  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 -Lumbar puncture did not show any evidence of obvious infection, culture data pending.  Gram stain negative.  Cryptococcal antigen-negative -UA-negative -Chest x-ray-haziness at right lung base, procalcitonin negative -We will add Accu-Cheks. Change fluid to D5 half-normal saline  SIRS -No obvious evidence of infection noted. -Procalcitonin-negative, TSH normal.  Lactic acid-normal -Right chest wall Chemo-Port in place -Thus far due to any lack of evidence for infection, it is reasonable to go ahead and stop antibiotics/antiviral and monitor him clinically and his culture data. Briefly discussed with Dr Megan Salon.   Rectal adenocarcinoma -Currently on chemotherapy.  Follows with Dr. Burr Medico   DVT prophylaxis: SCDs Start: 09/11/19 1514 Code Status: Full code Family Communication:    Status is: Inpatient  Remains inpatient appropriate because:Altered mental status   Dispo: The patient is from: Home               Anticipated d/c is to: Home              Anticipated d/c date is: 2 days              Patient currently is not medically stable to d/c.  Patient still confused, unstable to be discharged home.  Ongoing investigation for his confusion.   There is no height or weight on file to calculate BMI.      Subjective: Patient seen and examined at bedside he is alert awake oriented X3 but keeps his eyes shut, would not let me open it.  Follows all the basic commands remains afebrile overnight.  Review of Systems Otherwise negative except as per HPI, including: General: Denies fever, chills, night sweats or unintended weight loss. Resp: Denies cough, wheezing, shortness of breath. Cardiac: Denies chest pain, palpitations, orthopnea, paroxysmal nocturnal dyspnea. GI: Denies abdominal pain, nausea, vomiting, diarrhea or constipation GU: Denies dysuria, frequency, hesitancy or incontinence MS: Denies muscle aches, joint pain or swelling Neuro: Denies headache, neurologic deficits (focal weakness, numbness, tingling), abnormal gait Psych: Denies anxiety, depression, SI/HI/AVH Skin: Denies new rashes or lesions ID: Denies sick contacts, exotic exposures, travel  Examination:  General exam: Patient appears calm comfortable laying in the stretcher, not in acute distress. Respiratory system: Clear to auscultation. Respiratory effort normal. Cardiovascular system: S1 & S2 heard, RRR. No JVD, murmurs, rubs, gallops or clicks. No pedal edema. Gastrointestinal system: Abdomen is nondistended, soft and nontender. No organomegaly or masses felt. Normal bowel sounds heard. Central nervous system: No obvious gross neurologic deficits but unable to perform ocular exam Extremities: Symmetric 5 x 5 power. Skin: No rashes, lesions or ulcers Psychiatry: Alert  awake oriented X3   Objective: Vitals:   09/12/19 0019 09/12/19 0236 09/12/19 0600 09/12/19 0604  BP: 109/69 120/72 119/71 119/71  Pulse: 65 74 (!)  53 (!) 55  Resp: 16 19 17 15   Temp:      TempSrc:      SpO2: 100% 100% 100%     Intake/Output Summary (Last 24 hours) at 09/12/2019 0809 Last data filed at 09/12/2019 0631 Gross per 24 hour  Intake 350 ml  Output 1100 ml  Net -750 ml   There were no vitals filed for this visit.   Data Reviewed:   CBC: Recent Labs  Lab 09/10/19 0905 09/11/19 0953  WBC 5.4 12.0*  NEUTROABS 2.2 10.2*  HGB 12.5* 12.3*  HCT 37.6* 36.7*  MCV 87.2 89.3  PLT 172 440*   Basic Metabolic Panel: Recent Labs  Lab 09/10/19 0905 09/11/19 0953 09/12/19 0545  NA 141 138 142  K 3.9 3.6 3.0*  CL 107 105 117*  CO2 25 23 15*  GLUCOSE 114* 116* 84  BUN 12 16 12   CREATININE 0.84 0.89 0.70  CALCIUM 9.6 8.8* 6.9*   GFR: Estimated Creatinine Clearance: 69.9 mL/min (by C-G formula based on SCr of 0.7 mg/dL). Liver Function Tests: Recent Labs  Lab 09/10/19 0905 09/11/19 0953 09/12/19 0545  AST 31 42* 40  ALT 20 31 25   ALKPHOS 82 67 51  BILITOT 0.5 0.5 0.9  PROT 6.8 6.8 4.8*  ALBUMIN 3.9 4.0 2.9*   No results for input(s): LIPASE, AMYLASE in the last 168 hours. Recent Labs  Lab 09/11/19 0954  AMMONIA 25   Coagulation Profile: Recent Labs  Lab 09/11/19 0953  INR 1.0   Cardiac Enzymes: No results for input(s): CKTOTAL, CKMB, CKMBINDEX, TROPONINI in the last 168 hours. BNP (last 3 results) No results for input(s): PROBNP in the last 8760 hours. HbA1C: No results for input(s): HGBA1C in the last 72 hours. CBG: Recent Labs  Lab 09/11/19 0943  GLUCAP 113*   Lipid Profile: No results for input(s): CHOL, HDL, LDLCALC, TRIG, CHOLHDL, LDLDIRECT in the last 72 hours. Thyroid Function Tests: Recent Labs    09/12/19 0546  TSH 0.784   Anemia Panel: Recent Labs    09/12/19 0546  VITAMINB12 759  FERRITIN 141  TIBC 404  IRON 126   Sepsis Labs: Recent Labs  Lab 09/11/19 1348 09/11/19 1349 09/11/19 1516  PROCALCITON  --   --  <0.10  LATICACIDVEN 1.8 1.8  --     Recent  Results (from the past 240 hour(s))  SARS Coronavirus 2 by RT PCR (hospital order, performed in [facility] hospital lab) Nasopharyngeal Nasopharyngeal Swab     Status: None   Collection Time: 09/11/19  1:47 PM   Specimen: Nasopharyngeal Swab  Result Value Ref Range Status   SARS Coronavirus 2 NEGATIVE NEGATIVE Final    Comment: (NOTE) SARS-CoV-2 target nucleic acids are NOT DETECTED.  The SARS-CoV-2 RNA is generally detectable in upper and lower respiratory specimens during the acute phase of infection. The lowest concentration of SARS-CoV-2 viral copies this assay can detect is 250 copies / mL. A negative result does not preclude SARS-CoV-2 infection and should not be used as the sole basis for treatment or other patient management decisions.  A negative result may occur with improper specimen collection / handling, submission of specimen other than nasopharyngeal swab, presence of viral mutation(s) within the areas targeted by this assay, and inadequate number of viral copies (<250 copies / mL). A negative  result must be combined with clinical observations, patient history, and epidemiological information.  Fact Sheet for Patients:   StrictlyIdeas.no  Fact Sheet for Healthcare Providers: BankingDealers.co.za  This test is not yet approved or  cleared by the [place] FDA and has been authorized for detection and/or diagnosis of SARS-CoV-2 by FDA under an Emergency Use Authorization (EUA).  This EUA will remain in effect (meaning this test can be used) for the duration of the COVID-19 declaration under Section 564(b)(1) of the Act, 21 U.S.C. section 360bbb-3(b)(1), unless the authorization is terminated or revoked sooner.  Performed at [facility], [place] [address], [place], [place] [address]   CSF culture     Status: None (Preliminary result)   Collection Time: 09/11/19  1:48 PM   Specimen: CSF;  Cerebrospinal Fluid  Result Value Ref Range Status   Specimen Description CSF  Final   Special Requests Immunocompromised  Final   Gram Stain   Final    NO WBC SEEN NO ORGANISMS SEEN CYTOSPIN SMEAR Gram Stain Report Called to,Read Back By and Verified With: L.BALDWIN AT 9373 ON 09/11/19 BY N.THOMPSON Performed at [facility], [place] [address], [place], [place] [address]    Culture PENDING  Incomplete   Report Status PENDING  Incomplete         Radiology Studies: CT Head Wo Contrast  Result Date: 09/11/2019 CLINICAL DATA:  Altered mental status EXAM: CT HEAD WITHOUT CONTRAST TECHNIQUE: Contiguous axial images were obtained from the base of the skull through the vertex without intravenous contrast. COMPARISON:  None. FINDINGS: Brain: Ventricles are mildly prominent with normal appearing sulci. There is no intracranial mass, hemorrhage, extra-axial fluid collection, or midline shift. The brain parenchyma appears unremarkable. No acute infarct demonstrable. Vascular: No hyperdense vessel. There are foci of calcification in each carotid siphon region. Skull: Bony calvarium appears intact. Sinuses/Orbits: There is mucosal thickening in several ethmoid air cells. Visualized orbits appear symmetric bilaterally. Other: Mastoid air cells are clear. There is debris in the right external auditory canal. IMPRESSION: Mild ventricular prominence with normal appearing sulci. Question early communicating hydrocephalus. Brain parenchyma appears unremarkable. No acute infarct. No mass or hemorrhage. Foci of arterial vascular calcification noted. There is mucosal thickening in several ethmoid air cells. There is probable cerumen in the right external auditory canal. Electronically Signed   By: Lowella Grip III M.D.   On: 09/11/2019 10:37   MR BRAIN WO CONTRAST  Result Date: 09/11/2019 CLINICAL DATA:  Seizure with abnormal neuro exam EXAM: MRI HEAD WITHOUT CONTRAST TECHNIQUE: Multiplanar,  multiecho pulse sequences of the brain and surrounding structures were obtained without intravenous contrast. COMPARISON:  CT head 09/11/2019.  MRI head 04/03/2019 FINDINGS: Brain: Limited study. The patient was not able to complete all sequences. Images obtained are of diagnostic quality. Negative for acute infarct. Mild ventricular enlargement is stable from prior studies and may be due to atrophy. Negative for hemorrhage or mass. Vascular: Normal arterial flow voids Skull and upper cervical spine: No focal skeletal lesion. Sinuses/Orbits: Mild mucosal edema paranasal sinuses. Negative orbit Other: None IMPRESSION: Incomplete study. Images obtained reveal no acute abnormality. Negative for acute infarct. Atrophy and mild ventricular enlargement stable from prior studies. Electronically Signed   By: Franchot Gallo M.D.   On: 09/11/2019 19:27   DG CHEST PORT 1 VIEW  Result Date: 09/11/2019 CLINICAL DATA:  Combative.  History of rectal adenocarcinoma. EXAM: PORTABLE CHEST 1 VIEW COMPARISON:  Chest x-ray dated Jul 01, 2019 FINDINGS: The  right-sided Port-A-Cath is unchanged in positioning. There is a new hazy airspace opacity at the right lung base. There is no pneumothorax. No definite pleural effusion. The heart size is stable. There is no acute osseous abnormality. IMPRESSION: 1. New hazy airspace opacity at the right lung base, suspicious for pneumonia or atelectasis. 2. Stable positioning of the right-sided Port-A-Cath. Electronically Signed   By: Constance Holster M.D.   On: 09/11/2019 17:58   EEG adult  Result Date: 09/11/2019 Lora Havens, MD     09/11/2019  5:28 PM Patient Name: Carlos [name] MRN: [id number] Epilepsy Attending: Lora Havens Referring Physician/Provider: Dr Bonnielee Haff Date: 09/11/2019 Duration: 25.13 mins Patient history: 74yo M with ams. EEG to evaluate for seizure Level of alertness: Awake, asleep AEDs during EEG study: None Technical aspects: This EEG study was done with  scalp electrodes positioned according to the 10-20 International system of electrode placement. Electrical activity was acquired at a sampling rate of 500Hz  and reviewed with a high frequency filter of 70Hz  and a low frequency filter of 1Hz . EEG data were recorded continuously and digitally stored. Description: The posterior dominant rhythm consists of 8 Hz activity of moderate voltage (25-35 uV) seen predominantly in posterior head regions, symmetric and reactive to eye opening and eye closing. Sleep was characterized by vertex waves, sleep spindles (12 to 14 Hz), maximal frontocentral region. Intermittent generalized 3 to 6 Hz theta-delta slowing as well as bilateral independent left> right frontotemporal spikes were noted.  Hyperventilation and photic stimulation were not performed.   ABNORMALITY -Intermittent slow, generalized -Spike, left and right frontotemporal IMPRESSION: This study showed evidence of potential epileptogenicity arising from left more than right frontotemporal region as well as mild diffuse encephalopathy, nonspecific etiology. No seizures were seen throughout the recording. Priyanka Barbra Sarks        Scheduled Meds: . LORazepam  1 mg Intravenous Once   Continuous Infusions: . acyclovir 610 mg (09/12/19 0545)  . cefTRIAXone (ROCEPHIN)  IV Stopped (09/11/19 2243)  . lactated ringers 125 mL/hr at 09/11/19 1546  . levETIRAcetam 1,000 mg (09/11/19 1803)  . vancomycin Stopped (09/12/19 0331)     LOS: 1 day   Time spent= 35 mins    Caterra Ostroff Arsenio Loader, MD [facility]  If 7PM-7AM, please contact night-coverage  09/12/2019, 8:09 AM

## 2019-09-12 NOTE — ED Notes (Signed)
Floor call stating the room is not clean yet and to hold the patient in ED until the room get clean.

## 2019-09-12 NOTE — ED Notes (Signed)
Patient is resting comfortably. 

## 2019-09-12 NOTE — ED Notes (Signed)
ED TO INPATIENT HANDOFF REPORT  ED Nurse Name and Phone #: [phone]  S Name/Age/Gender Carlos [name] 74 y.o. male Room/Bed: WA22/WA22  Code Status   Code Status: Full Code  Home/SNF/Other Home Patient oriented to: self and place Is this baseline? No   Triage Complete: Triage complete  Chief Complaint Acute encephalopathy [G93.40]  Triage Note Per EMS pt was combative on scene with [employer]. They were called out for AMS. Pt received 5 of Versed and 5 of Haldol. Pt sleeping during triage.    Allergies No Known Allergies  Level of Care/Admitting Diagnosis ED Disposition    ED Disposition Condition [facility]: [place] [100102]  Level of Care: Progressive [102]  Admit to Progressive based on following criteria: NEUROLOGICAL AND NEUROSURGICAL complex patients with significant risk of instability, who do not meet ICU criteria, yet require close observation or frequent assessment (< / = every 2 - 4 hours) with medical / nursing intervention.  May admit patient to [facility] or [facility] if equivalent level of care is available:: No  Covid Evaluation: Asymptomatic Screening Protocol (No Symptoms)  Diagnosis: Acute encephalopathy [147829]  Admitting Physician: Bonnielee Haff [[id number]]  Attending Physician: Bonnielee Haff [[id number]]  Estimated length of stay: past midnight tomorrow  Certification:: I certify this patient will need inpatient services for at least 2 midnights       B Medical/Surgery History Past Medical History:  Diagnosis Date  . Anxiety   . Benign prostatic hyperplasia 03/30/2013   10/1 IMO update  . Cancer ([place])   . 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 PORTACATH PLACEMENT N/A 07/01/2019   Procedure: PORT ULTRASOUND GUIDED PLACEMENT;  Surgeon: Alphonsa Overall, MD;  Location: [facility];  Service: General;  Laterality: N/A;  . PROSTATE SURGERY   2004  . TONSILECTOMY, ADENOIDECTOMY, BILATERAL MYRINGOTOMY AND TUBES       A IV Location/Drains/Wounds Patient Lines/Drains/Airways Status    Active Line/Drains/Airways    Name Placement date Placement time Site Days   Implanted Port 07/01/19 Right Chest 07/01/19  0947  Chest  73   Peripheral IV 09/11/19 Anterior;Left;Upper Arm 09/11/19  0955  Arm  1   Peripheral IV 09/11/19 Right Forearm 09/11/19  1404  Forearm  1   Incision (Closed) 07/01/19 Chest Right 07/01/19  0935   73          Intake/Output Last 24 hours  Intake/Output Summary (Last 24 hours) at 09/12/2019 1349 Last data filed at 09/12/2019 0631 Gross per 24 hour  Intake 350 ml  Output 650 ml  Net -300 ml    Labs/Imaging Results for orders placed or performed during the hospital encounter of 09/11/19 (from the past 48 hour(s))  CBG monitoring, ED     Status: Abnormal   Collection Time: 09/11/19  9:43 AM  Result Value Ref Range   Glucose-Capillary 113 (H) 70 - 99 mg/dL    Comment: Glucose reference range applies only to samples taken after fasting for at least 8 hours.  Comprehensive metabolic panel     Status: Abnormal   Collection Time: 09/11/19  9:53 AM  Result Value Ref Range   Sodium 138 135 - 145 mmol/L   Potassium 3.6 3.5 - 5.1 mmol/L   Chloride 105 98 - 111 mmol/L   CO2 23 22 - 32 mmol/L   Glucose, Bld 116 (H) 70 - 99 mg/dL    Comment:  Glucose reference range applies only to samples taken after fasting for at least 8 hours.   BUN 16 8 - 23 mg/dL   Creatinine, Ser 0.89 0.61 - 1.24 mg/dL   Calcium 8.8 (L) 8.9 - 10.3 mg/dL   Total Protein 6.8 6.5 - 8.1 g/dL   Albumin 4.0 3.5 - 5.0 g/dL   AST 42 (H) 15 - 41 U/L   ALT 31 0 - 44 U/L   Alkaline Phosphatase 67 38 - 126 U/L   Total Bilirubin 0.5 0.3 - 1.2 mg/dL   GFR calc non Af Amer >60 >60 mL/min   GFR calc Af Amer >60 >60 mL/min   Anion gap 10 5 - 15    Comment: Performed at [facility], [place] [address] [place], [place] [address]  CBC  WITH DIFFERENTIAL     Status: Abnormal   Collection Time: 09/11/19  9:53 AM  Result Value Ref Range   WBC 12.0 (H) 4.0 - 10.5 K/uL   RBC 4.11 (L) 4.22 - 5.81 MIL/uL   Hemoglobin 12.3 (L) 13.0 - 17.0 g/dL   HCT 36.7 (L) 39 - 52 %   MCV 89.3 80.0 - 100.0 fL   MCH 29.9 26.0 - 34.0 pg   MCHC 33.5 30.0 - 36.0 g/dL   RDW 15.2 11.5 - 15.5 %   Platelets 145 (L) 150 - 400 K/uL   nRBC 0.0 0.0 - 0.2 %   Neutrophils Relative % 85 %   Neutro Abs 10.2 (H) 1.7 - 7.7 K/uL   Lymphocytes Relative 5 %   Lymphs Abs 0.6 (L) 0.7 - 4.0 K/uL   Monocytes Relative 10 %   Monocytes Absolute 1.2 (H) 0 - 1 K/uL   Eosinophils Relative 0 %   Eosinophils Absolute 0.0 0 - 0 K/uL   Basophils Relative 0 %   Basophils Absolute 0.0 0 - 0 K/uL   Immature Granulocytes 0 %   Abs Immature Granulocytes 0.04 0.00 - 0.07 K/uL    Comment: Performed at [facility], [place] [address] [place], [place] [address]  Protime-INR     Status: None   Collection Time: 09/11/19  9:53 AM  Result Value Ref Range   Prothrombin Time 13.1 11.4 - 15.2 seconds   INR 1.0 0.8 - 1.2    Comment: (NOTE) INR goal varies based on device and disease states. Performed at [facility], [place] [address] [place], [place] [address]   APTT     Status: Abnormal   Collection Time: 09/11/19  9:53 AM  Result Value Ref Range   aPTT 22 (L) 24 - 36 seconds    Comment: Performed at [facility], [place] [address] [address] [place], [place] [address]  Ammonia     Status: None   Collection Time: 09/11/19  9:54 AM  Result Value Ref Range   Ammonia 25 9 - 35 umol/L    Comment: Performed at [facility], [place] [address] [place], [place] [address]  Urinalysis, Complete w Microscopic Urine, Catheterized     Status: Abnormal   Collection Time: 09/11/19 11:27 AM  Result Value Ref Range   Color, Urine YELLOW YELLOW   APPearance CLOUDY (A) CLEAR   Specific Gravity, Urine 1.017 1.005 - 1.030   pH 5.0  5.0 - 8.0   Glucose, UA NEGATIVE NEGATIVE mg/dL   Hgb urine dipstick NEGATIVE NEGATIVE   Bilirubin Urine NEGATIVE NEGATIVE   Ketones, ur NEGATIVE NEGATIVE mg/dL   Protein,  ur 30 (A) NEGATIVE mg/dL   Nitrite NEGATIVE NEGATIVE   Leukocytes,Ua NEGATIVE NEGATIVE   RBC / HPF 0-5 0 - 5 RBC/hpf   WBC, UA 0-5 0 - 5 WBC/hpf   Bacteria, UA RARE (A) NONE SEEN   Squamous Epithelial / LPF 0-5 0 - 5   Hyaline Casts, UA PRESENT    Sperm, UA PRESENT     Comment: Performed at [facility], [place] [address], [place], [place] [address]  Urine culture     Status: None   Collection Time: 09/11/19 11:27 AM   Specimen: Urine, Clean Catch  Result Value Ref Range   Specimen Description      URINE, CLEAN CATCH Performed at [facility], [place] [address], [place], [place] [address]    Special Requests      NONE Performed at [facility], [place] [address] [place], [place] [address]    Culture      NO GROWTH Performed at [facility], [place] [address] [place], [place] [address]    Report Status 09/12/2019 FINAL   CSF cell count with differential collection tube #: 1     Status: Abnormal   Collection Time: 09/11/19  1:47 PM  Result Value Ref Range   Tube # 1    Color, CSF COLORLESS COLORLESS   Appearance, CSF CLEAR CLEAR   Supernatant NOT INDICATED    RBC Count, CSF 2 (H) 0 /cu mm   WBC, CSF 1 0 - 5 /cu mm   Other Cells, CSF TOO FEW TO COUNT, SMEAR AVAILABLE FOR REVIEW     Comment: Performed at [facility], [place] [address], [place], [place] [address]  SARS Coronavirus 2 by RT PCR (hospital order, performed in [facility] hospital lab) Nasopharyngeal Nasopharyngeal Swab     Status: None   Collection Time: 09/11/19  1:47 PM   Specimen: Nasopharyngeal Swab  Result Value Ref Range   SARS Coronavirus 2 NEGATIVE NEGATIVE    Comment: (NOTE) SARS-CoV-2 target nucleic acids are NOT DETECTED.  The SARS-CoV-2 RNA is generally  detectable in upper and lower respiratory specimens during the acute phase of infection. The lowest concentration of SARS-CoV-2 viral copies this assay can detect is 250 copies / mL. A negative result does not preclude SARS-CoV-2 infection and should not be used as the sole basis for treatment or other patient management decisions.  A negative result may occur with improper specimen collection / handling, submission of specimen other than nasopharyngeal swab, presence of viral mutation(s) within the areas targeted by this assay, and inadequate number of viral copies (<250 copies / mL). A negative result must be combined with clinical observations, patient history, and epidemiological information.  Fact Sheet for Patients:   StrictlyIdeas.no  Fact Sheet for Healthcare Providers: BankingDealers.co.za  This test is not yet approved or  cleared by the [place] FDA and has been authorized for detection and/or diagnosis of SARS-CoV-2 by FDA under an Emergency Use Authorization (EUA).  This EUA will remain in effect (meaning this test can be used) for the duration of the COVID-19 declaration under Section 564(b)(1) of the Act, 21 U.S.C. section 360bbb-3(b)(1), unless the authorization is terminated or revoked sooner.  Performed at [facility], [place] [address] [place],  [address]   CSF cell count with differential collection tube #: 4     Status: None   Collection Time: 09/11/19  1:48 PM  Result Value Ref Range  Tube # 4    Color, CSF COLORLESS COLORLESS   Appearance, CSF CLEAR CLEAR   Supernatant NOT INDICATED    RBC Count, CSF 0 0 /cu mm   WBC, CSF 1 0 - 5 /cu mm   Other Cells, CSF TOO FEW TO COUNT, SMEAR AVAILABLE FOR REVIEW     Comment: Performed at [facility], [place] [address] [place], [place] [address]  CSF culture     Status: None (Preliminary result)   Collection Time: 09/11/19   1:48 PM   Specimen: CSF; Cerebrospinal Fluid  Result Value Ref Range   Specimen Description      CSF Performed at [place] [address] [place], [place] [address]    Special Requests      Immunocompromised Performed at [facility], [place] [address] [place], [place] [address]    Gram Stain      NO WBC SEEN NO ORGANISMS SEEN CYTOSPIN SMEAR Gram Stain Report Called to,Read Back By and Verified With: L.BALDWIN AT 3151 ON 09/11/19 BY N.THOMPSON Performed at [facility], [place] [address] [place], [place] [address]    Culture      NO GROWTH < 12 HOURS Performed at [place] [address], [place], [place] [address]    Report Status PENDING   Glucose, CSF     Status: Abnormal   Collection Time: 09/11/19  1:48 PM  Result Value Ref Range   Glucose, CSF 79 (H) 40 - 70 mg/dL    Comment: Performed at [facility], [place] [address] [place], [place] [address]  Protein, CSF     Status: None   Collection Time: 09/11/19  1:48 PM  Result Value Ref Range   Total  Protein, CSF 24 15 - 45 mg/dL    Comment: Performed at [facility], [place] [address], [place], [place] [address]  Cryptococcal antigen, CSF     Status: None   Collection Time: 09/11/19  1:48 PM  Result Value Ref Range   Crypto Ag NEGATIVE NEGATIVE   Cryptococcal Ag Titer NOT INDICATED NOT INDICATED    Comment: Performed at [facility], [place] [address] [place], [place] [address]  Culture, blood (single)     Status: None (Preliminary result)   Collection Time: 09/11/19  1:48 PM   Specimen: BLOOD  Result Value Ref Range   Specimen Description      BLOOD RIGHT ANTECUBITAL Performed at [place] [address] [place], [place] [address]    Special Requests      BOTTLES DRAWN AEROBIC AND ANAEROBIC Blood Culture results may not be optimal due to an inadequate volume of blood received in culture  bottles Performed at [facility], [place] [address] [name], [place] [address]    Culture      NO GROWTH < 24 HOURS Performed at [place] [address] [place], [place] [address]    Report Status PENDING   Lactic acid, plasma     Status: None   Collection Time: 09/11/19  1:48 PM  Result Value Ref Range   Lactic Acid, Venous 1.8 0.5 - 1.9 mmol/L    Comment: Performed at [facility], [place] [address], [place], [place] [address]  Lactic acid, plasma     Status: None   Collection Time: 09/11/19  1:49 PM  Result Value Ref Range   Lactic Acid, Venous 1.8  0.5 - 1.9 mmol/L    Comment: Performed at [facility], [place] [address], [place], [place] [address]  Procalcitonin     Status: None   Collection Time: 09/11/19  3:16 PM  Result Value Ref Range   Procalcitonin <0.10 ng/mL    Comment:        Interpretation: PCT (Procalcitonin) <= 0.5 ng/mL: Systemic infection (sepsis) is not likely. Local bacterial infection is possible. (NOTE)       Sepsis PCT Algorithm           Lower Respiratory Tract                                      Infection PCT Algorithm    ----------------------------     ----------------------------         PCT < 0.25 ng/mL                PCT < 0.10 ng/mL          Strongly encourage             Strongly discourage   discontinuation of antibiotics    initiation of antibiotics    ----------------------------     -----------------------------       PCT 0.25 - 0.50 ng/mL            PCT 0.10 - 0.25 ng/mL               OR       >80% decrease in PCT            Discourage initiation of                                            antibiotics      Encourage discontinuation           of antibiotics    ----------------------------     -----------------------------         PCT >= 0.50 ng/mL              PCT 0.26 - 0.50 ng/mL               AND        <80% decrease in PCT             Encourage initiation of                                              antibiotics       Encourage continuation           of antibiotics    ----------------------------     -----------------------------        PCT >= 0.50 ng/mL                  PCT > 0.50 ng/mL               AND         increase in PCT                  Strongly encourage  initiation of antibiotics    Strongly encourage escalation           of antibiotics                                     -----------------------------                                           PCT <= 0.25 ng/mL                                                 OR                                        > 80% decrease in PCT                                      Discontinue / Do not initiate                                             antibiotics  Performed at [place] [address], [place], [place] [address]   Comprehensive metabolic panel     Status: Abnormal   Collection Time: 09/12/19  5:45 AM  Result Value Ref Range   Sodium 142 135 - 145 mmol/L   Potassium 3.0 (L) 3.5 - 5.1 mmol/L   Chloride 117 (H) 98 - 111 mmol/L   CO2 15 (L) 22 - 32 mmol/L   Glucose, Bld 84 70 - 99 mg/dL    Comment: Glucose reference range applies only to samples taken after fasting for at least 8 hours.   BUN 12 8 - 23 mg/dL   Creatinine, Ser 0.70 0.61 - 1.24 mg/dL   Calcium 6.9 (L) 8.9 - 10.3 mg/dL   Total Protein 4.8 (L) 6.5 - 8.1 g/dL   Albumin 2.9 (L) 3.5 - 5.0 g/dL   AST 40 15 - 41 U/L   ALT 25 0 - 44 U/L   Alkaline Phosphatase 51 38 - 126 U/L   Total Bilirubin 0.9 0.3 - 1.2 mg/dL   GFR calc non Af Amer >60 >60 mL/min   GFR calc Af Amer >60 >60 mL/min   Anion gap 10 5 - 15    Comment: Performed at [facility], [place] [address], [place], [place] [address]  HIV Antibody (routine testing w rflx)     Status: None   Collection Time: 09/12/19  5:45 AM  Result Value Ref Range   HIV Screen 4th Generation wRfx Non Reactive Non  Reactive    Comment: Performed at [facility], [place] [address] [place], [place] [address]  TSH     Status: None   Collection Time: 09/12/19  5:46 AM  Result Value Ref Range   TSH 0.784 0.350 - 4.500 uIU/mL    Comment: Performed by a 3rd  Generation assay with a functional sensitivity of <=0.01 uIU/mL. Performed at [facility], [place] [address], [place], [place] [address]   Vitamin B12     Status: None   Collection Time: 09/12/19  5:46 AM  Result Value Ref Range   Vitamin B-12 759 180 - 914 pg/mL    Comment: (NOTE) This assay is not validated for testing neonatal or myeloproliferative syndrome specimens for Vitamin B12 levels. Performed at [facility], [place] [address] [place], [place] [address]   Folate     Status: None   Collection Time: 09/12/19  5:46 AM  Result Value Ref Range   Folate 76.9 >5.9 ng/mL    Comment: RESULTS CONFIRMED BY MANUAL DILUTION Performed at [place] [address], [place], [place] [address]   Iron and TIBC     Status: None   Collection Time: 09/12/19  5:46 AM  Result Value Ref Range   Iron 126 45 - 182 ug/dL   TIBC 404 250 - 450 ug/dL   Saturation Ratios 31 17.9 - 39.5 %   UIBC 278 ug/dL    Comment: Performed at [facility], [place] [address] [place], [place] [address]  Ferritin     Status: None   Collection Time: 09/12/19  5:46 AM  Result Value Ref Range   Ferritin 141 24 - 336 ng/mL    Comment: Performed at [facility], [place] [address] [place], [place] [address]  CBG monitoring, ED     Status: None   Collection Time: 09/12/19  8:35 AM  Result Value Ref Range   Glucose-Capillary 89 70 - 99 mg/dL    Comment: Glucose reference range applies only to samples taken after fasting for at least 8 hours.  CBG monitoring, ED     Status: Abnormal   Collection Time: 09/12/19 12:38 PM  Result Value Ref Range   Glucose-Capillary 113 (H) 70 - 99 mg/dL     Comment: Glucose reference range applies only to samples taken after fasting for at least 8 hours.   CT Head Wo Contrast  Result Date: 09/11/2019 CLINICAL DATA:  Altered mental status EXAM: CT HEAD WITHOUT CONTRAST TECHNIQUE: Contiguous axial images were obtained from the base of the skull through the vertex without intravenous contrast. COMPARISON:  None. FINDINGS: Brain: Ventricles are mildly prominent with normal appearing sulci. There is no intracranial mass, hemorrhage, extra-axial fluid collection, or midline shift. The brain parenchyma appears unremarkable. No acute infarct demonstrable. Vascular: No hyperdense vessel. There are foci of calcification in each carotid siphon region. Skull: Bony calvarium appears intact. Sinuses/Orbits: There is mucosal thickening in several ethmoid air cells. Visualized orbits appear symmetric bilaterally. Other: Mastoid air cells are clear. There is debris in the right external auditory canal. IMPRESSION: Mild ventricular prominence with normal appearing sulci. Question early communicating hydrocephalus. Brain parenchyma appears unremarkable. No acute infarct. No mass or hemorrhage. Foci of arterial vascular calcification noted. There is mucosal thickening in several ethmoid air cells. There is probable cerumen in the right external auditory canal. Electronically Signed   By: Lowella Grip III M.D.   On: 09/11/2019 10:37   MR BRAIN WO CONTRAST  Result Date: 09/11/2019 CLINICAL DATA:  Seizure with abnormal neuro exam EXAM: MRI HEAD WITHOUT CONTRAST TECHNIQUE: Multiplanar, multiecho pulse sequences of the brain and surrounding structures were obtained without intravenous contrast. COMPARISON:  CT head 09/11/2019.  MRI head 04/03/2019 FINDINGS: Brain: Limited study. The patient was not able to complete  all sequences. Images obtained are of diagnostic quality. Negative for acute infarct. Mild ventricular enlargement is stable from prior studies and may be due to atrophy.  Negative for hemorrhage or mass. Vascular: Normal arterial flow voids Skull and upper cervical spine: No focal skeletal lesion. Sinuses/Orbits: Mild mucosal edema paranasal sinuses. Negative orbit Other: None IMPRESSION: Incomplete study. Images obtained reveal no acute abnormality. Negative for acute infarct. Atrophy and mild ventricular enlargement stable from prior studies. Electronically Signed   By: Franchot Gallo M.D.   On: 09/11/2019 19:27   DG CHEST PORT 1 VIEW  Result Date: 09/11/2019 CLINICAL DATA:  Combative.  History of rectal adenocarcinoma. EXAM: PORTABLE CHEST 1 VIEW COMPARISON:  Chest x-ray dated Jul 01, 2019 FINDINGS: The right-sided Port-A-Cath is unchanged in positioning. There is a new hazy airspace opacity at the right lung base. There is no pneumothorax. No definite pleural effusion. The heart size is stable. There is no acute osseous abnormality. IMPRESSION: 1. New hazy airspace opacity at the right lung base, suspicious for pneumonia or atelectasis. 2. Stable positioning of the right-sided Port-A-Cath. Electronically Signed   By: Constance Holster M.D.   On: 09/11/2019 17:58   EEG adult  Result Date: 09/11/2019 Lora Havens, MD     09/11/2019  5:28 PM Patient Name: Carlos [name] MRN: [id number] Epilepsy Attending: Lora Havens Referring Physician/Provider: Dr Bonnielee Haff Date: 09/11/2019 Duration: 25.13 mins Patient history: 74yo M with ams. EEG to evaluate for seizure Level of alertness: Awake, asleep AEDs during EEG study: None Technical aspects: This EEG study was done with scalp electrodes positioned according to the 10-20 International system of electrode placement. Electrical activity was acquired at a sampling rate of 500Hz  and reviewed with a high frequency filter of 70Hz  and a low frequency filter of 1Hz . EEG data were recorded continuously and digitally stored. Description: The posterior dominant rhythm consists of 8 Hz activity of moderate voltage (25-35 uV) seen  predominantly in posterior head regions, symmetric and reactive to eye opening and eye closing. Sleep was characterized by vertex waves, sleep spindles (12 to 14 Hz), maximal frontocentral region. Intermittent generalized 3 to 6 Hz theta-delta slowing as well as bilateral independent left> right frontotemporal spikes were noted.  Hyperventilation and photic stimulation were not performed.   ABNORMALITY -Intermittent slow, generalized -Spike, left and right frontotemporal IMPRESSION: This study showed evidence of potential epileptogenicity arising from left more than right frontotemporal region as well as mild diffuse encephalopathy, nonspecific etiology. No seizures were seen throughout the recording. [employer] (From admission, onward) Comment          Start     Ordered   09/13/19 4696  Basic metabolic panel  Daily,   R     Question:  Specimen collection method  Answer:  Lab=Lab collect   09/12/19 0818   09/13/19 0500  CBC  Daily,   R     Question:  Specimen collection method  Answer:  Lab=Lab collect   09/12/19 0818   09/13/19 0500  Magnesium  Daily,   R     Question:  Specimen collection method  Answer:  Lab=Lab collect   09/12/19 0818   09/12/19 0730  CBC with Differential/Platelet  Once,   R        09/12/19 0730   09/12/19 0730  Reticulocytes  Once,   R        09/12/19 0730   09/12/19 0500  RPR  Tomorrow morning,   R        09/11/19 1516   09/12/19 0500  CBC WITH DIFFERENTIAL  Tomorrow morning,   R        09/11/19 1516   09/11/19 1348  Hsv Culture And Typing  Once,   R        09/11/19 1348   09/11/19 1330  Herpes simplex virus (HSV), DNA by PCR Cerebrospinal Fluid  [facility] ED ADULT CSF PANEL)  ONCE - STAT,   STAT        09/11/19 1329          Vitals/Pain Today's Vitals   09/12/19 0816 09/12/19 0817 09/12/19 1018 09/12/19 1239  BP: (!) 97/58  117/69 134/78  Pulse: 78  71 64  Resp:   14 15  Temp:    98.1 F (36.7 C)  TempSrc:    Oral   SpO2: 100%  100% 100%  Weight:    60.8 kg  Height:    5\' 7"  (1.702 m)  PainSc:  0-No pain      Isolation Precautions No active isolations  Medications Medications  acetaminophen (TYLENOL) tablet 650 mg (has no administration in time range)    Or  acetaminophen (TYLENOL) suppository 650 mg (has no administration in time range)  ondansetron (ZOFRAN) tablet 4 mg (has no administration in time range)    Or  ondansetron (ZOFRAN) injection 4 mg (has no administration in time range)  levETIRAcetam (KEPPRA) IVPB 1000 mg/100 mL premix (1,000 mg Intravenous New Bag/Given 09/12/19 0811)  LORazepam (ATIVAN) injection 1 mg (has no administration in time range)  dextrose 5 %-0.45 % sodium chloride infusion ( Intravenous New Bag/Given 09/12/19 0958)  polyvinyl alcohol (LIQUIFILM TEARS) 1.4 % ophthalmic solution 2 drop (has no administration in time range)  midazolam (VERSED) injection 2 mg (2 mg Intravenous Given 09/11/19 1447)  vancomycin (VANCOREADY) IVPB 1250 mg/250 mL (0 mg Intravenous Stopped 09/11/19 1743)  potassium chloride 10 mEq in 100 mL IVPB (0 mEq Intravenous Stopped 09/12/19 1348)    Mobility walks with device     Focused Assessments .   R Recommendations: See Admitting Provider Note  Report given to:   Additional Notes: n/a

## 2019-09-13 LAB — GLUCOSE, CAPILLARY
Glucose-Capillary: 105 mg/dL — ABNORMAL HIGH (ref 70–99)
Glucose-Capillary: 120 mg/dL — ABNORMAL HIGH (ref 70–99)

## 2019-09-13 LAB — CBC
HCT: 40 % (ref 39.0–52.0)
Hemoglobin: 13.2 g/dL (ref 13.0–17.0)
MCH: 29.5 pg (ref 26.0–34.0)
MCHC: 33 g/dL (ref 30.0–36.0)
MCV: 89.3 fL (ref 80.0–100.0)
Platelets: 123 10*3/uL — ABNORMAL LOW (ref 150–400)
RBC: 4.48 MIL/uL (ref 4.22–5.81)
RDW: 15.4 % (ref 11.5–15.5)
WBC: 7.6 10*3/uL (ref 4.0–10.5)
nRBC: 0 % (ref 0.0–0.2)

## 2019-09-13 LAB — RPR: RPR Ser Ql: NONREACTIVE

## 2019-09-13 LAB — MAGNESIUM: Magnesium: 2.1 mg/dL (ref 1.7–2.4)

## 2019-09-13 LAB — BASIC METABOLIC PANEL
Anion gap: 11 (ref 5–15)
BUN: 11 mg/dL (ref 8–23)
CO2: 20 mmol/L — ABNORMAL LOW (ref 22–32)
Calcium: 9 mg/dL (ref 8.9–10.3)
Chloride: 108 mmol/L (ref 98–111)
Creatinine, Ser: 0.82 mg/dL (ref 0.61–1.24)
GFR calc Af Amer: >60 mL/min (ref >60)
GFR calc non Af Amer: >60 mL/min (ref >60)
Glucose, Bld: 124 mg/dL — ABNORMAL HIGH (ref 70–99)
Potassium: 3.6 mmol/L (ref 3.5–5.1)
Sodium: 139 mmol/L (ref 135–145)

## 2019-09-13 MED ORDER — SODIUM CHLORIDE 0.9 % IV SOLN: INTRAVENOUS | Status: AC

## 2019-09-13 MED ORDER — POTASSIUM CHLORIDE CRYS ER 20 MEQ PO TBCR
40.0000 meq | EXTENDED_RELEASE_TABLET | Freq: Once | ORAL | Status: AC
  Administered 2019-09-13: 40 meq via ORAL
  Filled 2019-09-13: qty 2

## 2019-09-13 NOTE — Progress Notes (Signed)
PROGRESS NOTE    Carlos [name]  ZOX:[id number] DOB: [birth year] DOA: 09/11/2019 PCP: [name], MD   Brief Narrative:  74-year with history of rectal adenocarcinoma on FOLFOX, mild cognitive impairment admitted for agitation and confusion.  Upon admission he was noted to be confused, febrile, leukocytosis.  CT head was nonspecific, LP performed did not show any evidence of obvious infection.  EEG showed potential epileptic spike therefore started on Keppra.  MRI study was incomplete but overall negative.  All the antibiotics was stopped, started on Keppra.  Monitoring patient.   Assessment & Plan:   Principal Problem:   Acute encephalopathy Active Problems:   Benign prostatic hyperplasia   Mild neurocognitive disorder of unclear etiology   Rectal adenocarcinoma (HCC)   Fever   Port-A-Cath in place  Fever/leukocytosis/altered mental status -Concerns of possible seizures.  EEG shows epileptic spike, started on Keppra.  MRI study incomplete but overall negative.  CT head negative.  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 -Lumbar puncture did not show any evidence of obvious infection, culture data remains negative thus far.  Gram stain negative.  Cryptococcal antigen-negative -UA-negative -Chest x-ray-haziness at right lung base, procalcitonin negative -For now continue IV fluids until his p.o. intake is remain consistent.  Monitor blood glucose.  SIRS -No obvious evidence of infection noted. -Procalcitonin-negative, TSH normal.  Lactic acid-normal -Right chest wall Chemo-Port in place -Thus far cultures remain negative.  Monitor him off antibiotics/antiviral.  Rectal adenocarcinoma -Currently on chemotherapy.  Follows with Dr. Burr Medico  PT/OT-pending   DVT prophylaxis: SCDs Start: 09/11/19 1514 Code Status: Full code Family Communication: Extensive discussion with patient's sister yesterday.  Today's brother is at bedside. Status is:  Inpatient  Remains inpatient appropriate because:Altered mental status   Dispo: The patient is from: Home              Anticipated d/c is to: Home              Anticipated d/c date is: 1-2 days              Patient currently is not medically stable to d/c.  Patient's mentation is slowly improving and much better this morning but his p.o. intake is still very inconsistent.   Body mass index is 20.99 kg/m.      Subjective: Patient is sitting at the side of the bed eating his breakfast.  He is alert awake oriented X4 denies any complaints.  According to nursing staff his oral intake is still inconsistent.  Early morning he did have an episode of confusion where he thought he woke up at his workplace.  Review of Systems Otherwise negative except as per HPI, including: General: Denies fever, chills, night sweats or unintended weight loss. Resp: Denies cough, wheezing, shortness of breath. Cardiac: Denies chest pain, palpitations, orthopnea, paroxysmal nocturnal dyspnea. GI: Denies abdominal pain, nausea, vomiting, diarrhea or constipation GU: Denies dysuria, frequency, hesitancy or incontinence MS: Denies muscle aches, joint pain or swelling Neuro: Denies headache, neurologic deficits (focal weakness, numbness, tingling), abnormal gait Psych: Denies anxiety, depression, SI/HI/AVH Skin: Denies new rashes or lesions ID: Denies sick contacts, exotic exposures, travel  Examination:  Constitutional: Not in acute distress Respiratory: Clear to auscultation bilaterally Cardiovascular: Normal sinus rhythm, no rubs Abdomen: Nontender nondistended good bowel sounds Musculoskeletal: No edema noted Skin: No rashes seen Neurologic: CN 2-12 grossly intact.  And nonfocal Psychiatric: Poor judgment and insight. Alert and oriented x 3. Normal mood.   Objective: Vitals:  09/12/19 1556 09/12/19 2039 09/13/19 0119 09/13/19 0522  BP: 127/79 140/88 128/67 (!) 136/92  Pulse: 64 (!) 58 61 (!) 58   Resp: 16 (!) 24 20 18   Temp: 98.4 F (36.9 C) 98.4 F (36.9 C) 98.6 F (37 C) 98.6 F (37 C)  TempSrc: Oral Oral Oral Oral  SpO2: 100% 100% 100% 100%  Weight:      Height:        Intake/Output Summary (Last 24 hours) at 09/13/2019 0743 Last data filed at 09/13/2019 0659 Gross per 24 hour  Intake 2409.73 ml  Output 650 ml  Net 1759.73 ml   Filed Weights   09/12/19 1239  Weight: 60.8 kg     Data Reviewed:   CBC: Recent Labs  Lab 09/10/19 0905 09/11/19 0953 09/12/19 1626 09/13/19 0555  WBC 5.4 12.0* 5.9 7.6  NEUTROABS 2.2 10.2* 3.1  --   HGB 12.5* 12.3* 12.5* 13.2  HCT 37.6* 36.7* 39.0 40.0  MCV 87.2 89.3 92.2 89.3  PLT 172 145* 118* 568*   Basic Metabolic Panel: Recent Labs  Lab 09/10/19 0905 09/11/19 0953 09/12/19 0545 09/13/19 0555  NA 141 138 142 139  K 3.9 3.6 3.0* 3.6  CL 107 105 117* 108  CO2 25 23 15* 20*  GLUCOSE 114* 116* 84 124*  BUN 12 16 12 11   CREATININE 0.84 0.89 0.70 0.82  CALCIUM 9.6 8.8* 6.9* 9.0  MG  --   --   --  2.1   GFR: Estimated Creatinine Clearance: 68 mL/min (by C-G formula based on SCr of 0.82 mg/dL). Liver Function Tests: Recent Labs  Lab 09/10/19 0905 09/11/19 0953 09/12/19 0545  AST 31 42* 40  ALT 20 31 25   ALKPHOS 82 67 51  BILITOT 0.5 0.5 0.9  PROT 6.8 6.8 4.8*  ALBUMIN 3.9 4.0 2.9*   No results for input(s): LIPASE, AMYLASE in the last 168 hours. Recent Labs  Lab 09/11/19 0954  AMMONIA 25   Coagulation Profile: Recent Labs  Lab 09/11/19 0953  INR 1.0   Cardiac Enzymes: No results for input(s): CKTOTAL, CKMB, CKMBINDEX, TROPONINI in the last 168 hours. BNP (last 3 results) No results for input(s): PROBNP in the last 8760 hours. HbA1C: No results for input(s): HGBA1C in the last 72 hours. CBG: Recent Labs  Lab 09/11/19 0943 09/12/19 0835 09/12/19 1238  GLUCAP 113* 89 113*   Lipid Profile: No results for input(s): CHOL, HDL, LDLCALC, TRIG, CHOLHDL, LDLDIRECT in the last 72 hours. Thyroid  Function Tests: Recent Labs    09/12/19 0546  TSH 0.784   Anemia Panel: Recent Labs    09/12/19 0546 09/12/19 1626  VITAMINB12 759  --   FOLATE 76.9  --   FERRITIN 141  --   TIBC 404  --   IRON 126  --   RETICCTPCT  --  1.2   Sepsis Labs: Recent Labs  Lab 09/11/19 1348 09/11/19 1349 09/11/19 1516  PROCALCITON  --   --  <0.10  LATICACIDVEN 1.8 1.8  --     Recent Results (from the past 240 hour(s))  Urine culture     Status: None   Collection Time: 09/11/19 11:27 AM   Specimen: Urine, Clean Catch  Result Value Ref Range Status   Specimen Description   Final    URINE, CLEAN CATCH Performed at [facility], [place] [address], [place], [place] [address]    Special Requests   Final    NONE Performed at [facility], [place] [address], [place], [place] [address]    Culture   Final    NO GROWTH Performed at [facility], [place] [address] [place], [place] [address]    Report Status 09/12/2019 FINAL  Final  SARS Coronavirus 2 by RT PCR (hospital order, performed in [facility] hospital lab) Nasopharyngeal Nasopharyngeal Swab     Status: None   Collection Time: 09/11/19  1:47 PM   Specimen: Nasopharyngeal Swab  Result Value Ref Range Status   SARS Coronavirus 2 NEGATIVE NEGATIVE Final    Comment: (NOTE) SARS-CoV-2 target nucleic acids are NOT DETECTED.  The SARS-CoV-2 RNA is generally detectable in upper and lower respiratory specimens during the acute phase of infection. The lowest concentration of SARS-CoV-2 viral copies this assay can detect is 250 copies / mL. A negative result does not preclude SARS-CoV-2 infection and should not be used as the sole basis for treatment or other patient management decisions.  A negative result may occur with improper specimen collection / handling, submission of specimen other than nasopharyngeal swab, presence of viral mutation(s) within the areas targeted by this assay, and inadequate  number of viral copies (<250 copies / mL). A negative result must be combined with clinical observations, patient history, and epidemiological information.  Fact Sheet for Patients:   StrictlyIdeas.no  Fact Sheet for Healthcare Providers: BankingDealers.co.za  This test is not yet approved or  cleared by the [place] FDA and has been authorized for detection and/or diagnosis of SARS-CoV-2 by FDA under an Emergency Use Authorization (EUA).  This EUA will remain in effect (meaning this test can be used) for the duration of the COVID-19 declaration under Section 564(b)(1) of the Act, 21 U.S.C. section 360bbb-3(b)(1), unless the authorization is terminated or revoked sooner.  Performed at [facility], [place] [address] [place], [place] [address]   CSF culture     Status: None (Preliminary result)   Collection Time: 09/11/19  1:48 PM   Specimen: CSF; Cerebrospinal Fluid  Result Value Ref Range Status   Specimen Description   Final    CSF Performed at [place] [address], [place], [place] [address]    Special Requests   Final    Immunocompromised Performed at [facility], [place] [address], [place], [place] [address]    Gram Stain   Final    NO WBC SEEN NO ORGANISMS SEEN CYTOSPIN SMEAR Gram Stain Report Called to,Read Back By and Verified With: L.BALDWIN AT 1761 ON 09/11/19 BY N.THOMPSON Performed at [facility], [place] [address] [place], [place] [address]    Culture   Final    NO GROWTH < 12 HOURS Performed at [facility] [address], [place], [place] [address]    Report Status PENDING  Incomplete  Culture, blood (single)     Status: None (Preliminary result)   Collection Time: 09/11/19  1:48 PM   Specimen: BLOOD  Result Value Ref Range Status   Specimen Description   Final    BLOOD RIGHT ANTECUBITAL Performed at [name] [address] [place], [place] [address]    Special Requests   Final    BOTTLES DRAWN AEROBIC AND ANAEROBIC Blood Culture results may not be optimal due to an inadequate volume of blood received in culture bottles Performed at [facility] [address] [place],  [address]    Culture   Final    NO  GROWTH < 24 HOURS Performed at [facility], [place] [address] [place], [place] [address]    Report Status PENDING  Incomplete         Radiology Studies: CT Head Wo Contrast  Result Date: 09/11/2019 CLINICAL DATA:  Altered mental status EXAM: CT HEAD WITHOUT CONTRAST TECHNIQUE: Contiguous axial images were obtained from the base of the skull through the vertex without intravenous contrast. COMPARISON:  None. FINDINGS: Brain: Ventricles are mildly prominent with normal appearing sulci. There is no intracranial mass, hemorrhage, extra-axial fluid collection, or midline shift. The brain parenchyma appears unremarkable. No acute infarct demonstrable. Vascular: No hyperdense vessel. There are foci of calcification in each carotid siphon region. Skull: Bony calvarium appears intact. Sinuses/Orbits: There is mucosal thickening in several ethmoid air cells. Visualized orbits appear symmetric bilaterally. Other: Mastoid air cells are clear. There is debris in the right external auditory canal. IMPRESSION: Mild ventricular prominence with normal appearing sulci. Question early communicating hydrocephalus. Brain parenchyma appears unremarkable. No acute infarct. No mass or hemorrhage. Foci of arterial vascular calcification noted. There is mucosal thickening in several ethmoid air cells. There is probable cerumen in the right external auditory canal. Electronically Signed   By: Lowella Grip III M.D.   On: 09/11/2019 10:37   MR BRAIN WO CONTRAST  Result Date: 09/11/2019 CLINICAL DATA:  Seizure with abnormal neuro exam EXAM: MRI HEAD WITHOUT CONTRAST  TECHNIQUE: Multiplanar, multiecho pulse sequences of the brain and surrounding structures were obtained without intravenous contrast. COMPARISON:  CT head 09/11/2019.  MRI head 04/03/2019 FINDINGS: Brain: Limited study. The patient was not able to complete all sequences. Images obtained are of diagnostic quality. Negative for acute infarct. Mild ventricular enlargement is stable from prior studies and may be due to atrophy. Negative for hemorrhage or mass. Vascular: Normal arterial flow voids Skull and upper cervical spine: No focal skeletal lesion. Sinuses/Orbits: Mild mucosal edema paranasal sinuses. Negative orbit Other: None IMPRESSION: Incomplete study. Images obtained reveal no acute abnormality. Negative for acute infarct. Atrophy and mild ventricular enlargement stable from prior studies. Electronically Signed   By: Franchot Gallo M.D.   On: 09/11/2019 19:27   DG CHEST PORT 1 VIEW  Result Date: 09/11/2019 CLINICAL DATA:  Combative.  History of rectal adenocarcinoma. EXAM: PORTABLE CHEST 1 VIEW COMPARISON:  Chest x-ray dated Jul 01, 2019 FINDINGS: The right-sided Port-A-Cath is unchanged in positioning. There is a new hazy airspace opacity at the right lung base. There is no pneumothorax. No definite pleural effusion. The heart size is stable. There is no acute osseous abnormality. IMPRESSION: 1. New hazy airspace opacity at the right lung base, suspicious for pneumonia or atelectasis. 2. Stable positioning of the right-sided Port-A-Cath. Electronically Signed   By: Constance Holster M.D.   On: 09/11/2019 17:58   EEG adult  Result Date: 09/11/2019 Lora Havens, MD     09/11/2019  5:28 PM Patient Name: Carlos [name] MRN: [id number] Epilepsy Attending: Lora Havens Referring Physician/Provider: Dr Bonnielee Haff Date: 09/11/2019 Duration: 25.13 mins Patient history: 74yo M with ams. EEG to evaluate for seizure Level of alertness: Awake, asleep AEDs during EEG study: None Technical aspects: This  EEG study was done with scalp electrodes positioned according to the 10-20 International system of electrode placement. Electrical activity was acquired at a sampling rate of 500Hz  and reviewed with a high frequency filter of 70Hz  and a low frequency filter of 1Hz . EEG data were recorded continuously and digitally stored. Description: The posterior dominant  rhythm consists of 8 Hz activity of moderate voltage (25-35 uV) seen predominantly in posterior head regions, symmetric and reactive to eye opening and eye closing. Sleep was characterized by vertex waves, sleep spindles (12 to 14 Hz), maximal frontocentral region. Intermittent generalized 3 to 6 Hz theta-delta slowing as well as bilateral independent left> right frontotemporal spikes were noted.  Hyperventilation and photic stimulation were not performed.   ABNORMALITY -Intermittent slow, generalized -Spike, left and right frontotemporal IMPRESSION: This study showed evidence of potential epileptogenicity arising from left more than right frontotemporal region as well as mild diffuse encephalopathy, nonspecific etiology. No seizures were seen throughout the recording. Priyanka Barbra Sarks        Scheduled Meds: . LORazepam  1 mg Intravenous Once   Continuous Infusions: . dextrose 5 % and 0.45% NaCl Stopped (09/13/19 0659)  . levETIRAcetam 1,000 mg (09/13/19 0659)     LOS: 2 days   Time spent= 35 mins    Aldine Grainger Arsenio Loader, MD [facility]  If 7PM-7AM, please contact night-coverage  09/13/2019, 7:43 AM

## 2019-09-14 LAB — CBC
HCT: 36.4 % — ABNORMAL LOW (ref 39.0–52.0)
Hemoglobin: 11.9 g/dL — ABNORMAL LOW (ref 13.0–17.0)
MCH: 29.6 pg (ref 26.0–34.0)
MCHC: 32.7 g/dL (ref 30.0–36.0)
MCV: 90.5 fL (ref 80.0–100.0)
Platelets: 123 10*3/uL — ABNORMAL LOW (ref 150–400)
RBC: 4.02 MIL/uL — ABNORMAL LOW (ref 4.22–5.81)
RDW: 15.3 % (ref 11.5–15.5)
WBC: 5.9 10*3/uL (ref 4.0–10.5)
nRBC: 0 % (ref 0.0–0.2)

## 2019-09-14 LAB — GLUCOSE, CAPILLARY
Glucose-Capillary: 100 mg/dL — ABNORMAL HIGH (ref 70–99)
Glucose-Capillary: 94 mg/dL (ref 70–99)
Glucose-Capillary: 77 mg/dL (ref 70–99)

## 2019-09-14 LAB — BASIC METABOLIC PANEL
Anion gap: 7 (ref 5–15)
BUN: 14 mg/dL (ref 8–23)
CO2: 23 mmol/L (ref 22–32)
Calcium: 8.6 mg/dL — ABNORMAL LOW (ref 8.9–10.3)
Chloride: 108 mmol/L (ref 98–111)
Creatinine, Ser: 0.75 mg/dL (ref 0.61–1.24)
GFR calc Af Amer: >60 mL/min (ref >60)
GFR calc non Af Amer: >60 mL/min (ref >60)
Glucose, Bld: 97 mg/dL (ref 70–99)
Potassium: 4.3 mmol/L (ref 3.5–5.1)
Sodium: 138 mmol/L (ref 135–145)

## 2019-09-14 LAB — MAGNESIUM: Magnesium: 2 mg/dL (ref 1.7–2.4)

## 2019-09-14 MED ORDER — LEVETIRACETAM 1000 MG PO TABS: 1000.0000 mg | ORAL_TABLET | Freq: Two times a day (BID) | ORAL | 0 refills | Status: DC

## 2019-09-14 NOTE — TOC Progression Note (Signed)
Transition of Care [facility]) - Progression Note    Patient Details  Name: Carlos [name] MRN: [id number] Date of Birth: 1946-01-02  Transition of Care [facility]) CM/SW Contact  Joaquin Courts, RN Phone Number: 09/14/2019, 2:06 PM  Clinical Narrative:    Patient declined RW.   Expected Discharge Plan: [place] Barriers to Discharge: No Barriers Identified  Expected Discharge Plan and Services Expected Discharge Plan: [place]   Discharge Planning Services: CM Consult Post Acute Care Choice: [place] arrangements for the past 2 months: Single Family Home Expected Discharge Date: 09/14/19               DME Arranged: N/A DME Agency: NA       HH Arranged: Patient Refused HH           Social Determinants of Health (SDOH) Interventions    Readmission Risk Interventions No flowsheet data found.

## 2019-09-14 NOTE — Progress Notes (Signed)
Patient asking to be discharged, and not wait to see OT.  Patient refused all recommendations from PT.  Discharge instructions discussed with patient and family, verbalized agreement and understanding.

## 2019-09-14 NOTE — Plan of Care (Signed)
  Problem: Education: Goal: Knowledge of General Education information will improve Description: Including pain rating scale, medication(s)/side effects and non-pharmacologic comfort measures Outcome: Progressing   Problem: Health Behavior/Discharge Planning: Goal: Ability to manage health-related needs will improve Outcome: Progressing   Problem: Clinical Measurements: Goal: Ability to maintain clinical measurements within normal limits will improve Outcome: Progressing Goal: Respiratory complications will improve Outcome: Progressing Goal: Cardiovascular complication will be avoided Outcome: Progressing   Problem: Activity: Goal: Risk for activity intolerance will decrease Outcome: Progressing   Problem: Coping: Goal: Level of anxiety will decrease Outcome: Progressing   Problem: Elimination: Goal: Will not experience complications related to urinary retention Outcome: Progressing   Problem: Safety: Goal: Ability to remain free from injury will improve Outcome: Progressing   Problem: Skin Integrity: Goal: Risk for impaired skin integrity will decrease Outcome: Progressing

## 2019-09-14 NOTE — Discharge Instructions (Signed)
I discussed the underwritten in detail with him.  He verbalized complete understanding.  Per [place] DMV statutes, patients with seizures are not allowed to drive until they have been seizure-free for six months.   Use caution when using heavy equipment or power tools. Avoid working on ladders or at heights. Take showers instead of baths. Ensure the water temperature is not too high on the home water heater. Do not go swimming alone. Do not lock yourself in a room alone (i.e. bathroom). When caring for infants or small children, sit down when holding, feeding, or changing them to minimize risk of injury to the child in the event you have a seizure. Maintain good sleep hygiene. Avoid alcohol.   If patienthas another seizure, call 911 and bring them back to the ED if: A. The seizure lasts longer than 5 minutes.  B. The patient doesn't wake shortly after the seizure or has new problems such as difficulty seeing, speaking or moving following the seizure C. The patient was injured during the seizure D. The patient has a temperature over 102 F (39C) E. The patient vomited during the seizure and now is having trouble breathing     Seizure, Adult A seizure is a sudden burst of abnormal electrical activity in the brain. Seizures usually last from 30 seconds to 2 minutes. They can cause many different symptoms. Usually, seizures are not harmful unless they last a long time. What are the causes? Common causes of this condition include:  Fever or infection.  Conditions that affect the brain, such as: ? A brain abnormality that you were born with. ? A brain or head injury. ? Bleeding in the brain. ? A tumor. ? Stroke. ? Brain disorders such as autism or cerebral palsy.  Low blood sugar.  Conditions that are passed from parent to child (are inherited).  Problems with substances, such as: ? Having a reaction to a drug or a medicine. ? Suddenly stopping the use of a  substance (withdrawal). In some cases, the cause may not be known. A person who has repeated seizures over time without a clear cause has a condition called epilepsy. What increases the risk? You are more likely to get this condition if you have:  A family history of epilepsy.  Had a seizure in the past.  A brain disorder.  A history of head injury, lack of oxygen at birth, or strokes. What are the signs or symptoms? There are many types of seizures. The symptoms vary depending on the type of seizure you have. Examples of symptoms during a seizure include:  Shaking (convulsions).  Stiffness in the body.  Passing out (losing consciousness).  Head nodding.  Staring.  Not responding to sound or touch.  Loss of bladder control and bowel control. Some people have symptoms right before and right after a seizure happens. Symptoms before a seizure may include:  Fear.  Worry (anxiety).  Feeling like you may vomit (nauseous).  Feeling like the room is spinning (vertigo).  Feeling like you saw or heard something before (dj vu).  Odd tastes or smells.  Changes in how you see. You may see flashing lights or spots. Symptoms after a seizure happens can include:  Confusion.  Sleepiness.  Headache.  Weakness on one side of the body. How is this treated? Most seizures will stop on their own in under 5 minutes. In these cases, no treatment is needed. Seizures that last longer than 5 minutes will usually need treatment. Treatment can   include:  Medicines given through an IV tube.  Avoiding things that are known to cause your seizures. These can include medicines that you take for another condition.  Medicines to treat epilepsy.  Surgery to stop the seizures. This may be needed if medicines do not help. Follow these instructions at home: Medicines  Take over-the-counter and prescription medicines only as told by your doctor.  Do not eat or drink anything that may keep  your medicine from working, such as alcohol. Activity  Do not do any activities that would be dangerous if you had another seizure, like driving or swimming. Wait until your doctor says it is safe for you to do them.  If you live in the [place] ask your local DMV (department of motor vehicles) when you can drive.  Get plenty of rest. Teaching others Teach friends and family what to do when you have a seizure. They should:  Lay you on the ground.  Protect your head and body.  Loosen any tight clothing around your neck.  Turn you on your side.  Not hold you down.  Not put anything into your mouth.  Know whether or not you need emergency care.  Stay with you until you are better.  General instructions  Contact your doctor each time you have a seizure.  Avoid anything that gives you seizures.  Keep a seizure diary. Write down: ? What you think caused each seizure. ? What you remember about each seizure.  Keep all follow-up visits as told by your doctor. This is important. Contact a doctor if:  You have another seizure.  You have seizures more often.  There is any change in what happens during your seizures.  You keep having seizures with treatment.  You have symptoms of being sick or having an infection. Get help right away if:  You have a seizure that: ? Lasts longer than 5 minutes. ? Is different than seizures you had before. ? Makes it harder to breathe. ? Happens after you hurt your head.  You have any of these symptoms after a seizure: ? Not being able to speak. ? Not being able to use a part of your body. ? Confusion. ? A bad headache.  You have two or more seizures in a row.  You do not wake up right after a seizure.  You get hurt during a seizure. These symptoms may be an emergency. Do not wait to see if the symptoms will go away. Get medical help right away. Call your local emergency services (911 in the [place] Do not drive yourself to the  hospital. Summary  Seizures usually last from 30 seconds to 2 minutes. Usually, they are not harmful unless they last a long time.  Do not eat or drink anything that may keep your medicine from working, such as alcohol.  Teach friends and family what to do when you have a seizure.  Contact your doctor each time you have a seizure. This information is not intended to replace advice given to you by your health care provider. Make sure you discuss any questions you have with your health care provider. Document Revised: 04/11/2018 Document Reviewed: 04/11/2018 Elsevier Patient Education  2020 [employer]  

## 2019-09-14 NOTE — Discharge Summary (Signed)
Physician Discharge Summary  [name] NWG:[id number] DOB: Aug 20, 1945 DOA: 09/11/2019  PCP: [name], MD  Admit date: 09/11/2019 Discharge date: 09/14/2019  Admitted From: Home Disposition: Home  Recommendations for Outpatient Follow-up:  1. Follow up with PCP in 1-2 weeks 2. Please obtain BMP/CBC in one week your next doctors visit.  3. Keppra 1000 mg p.o. twice daily started 4. Follow-up outpatient neurology next 2-4 weeks 5. Advised not to drive for at least next 6 months unless cleared by outpatient neurology   Discharge Condition: Stable CODE STATUS: Full code Diet recommendation: Regular  Brief/Interim Summary: 74-year with history of rectal adenocarcinoma on FOLFOX, mild cognitive impairment admitted for agitation and confusion.  Upon admission he was noted to be confused, febrile, leukocytosis.  CT head was nonspecific, LP performed did not show any evidence of obvious infection.  EEG showed potential epileptic spike therefore started on Keppra.  MRI study was incomplete but overall negative.  All the antibiotics was stopped, started on Keppra.   Infectious etiology was ruled out.  Seen by physical therapy.  His mentation has returned to baseline, communicating well, tolerating p.o. without any issues. Patient's family was updated, also attempted to call his sister on the day of discharge but she did not answer therefore left a voicemail. Patient has reached maximum benefit from hospital stay and stable for discharge.   Assessment & Plan:   Principal Problem:   Acute encephalopathy Active Problems:   Benign prostatic hyperplasia   Mild neurocognitive disorder of unclear etiology   Rectal adenocarcinoma (HCC)   Fever   Port-A-Cath in place  Fever/leukocytosis/altered mental status, resolved -All of this has resolved, no evidence of infection.  EEG concerning for epileptic spike therefore on Keppra which will be transitioned to p.o. 1000 mg twice daily.  Will need  to follow-up outpatient primary neurology in next 2-4 weeks. -Lumbar puncture did not show any evidence of obvious infection, culture data remains negative thus far.  Gram stain negative.  Cryptococcal antigen-negative -UA-negative -Chest x-ray-haziness at right lung base, procalcitonin negative  SIRS, resolved -No obvious evidence of infection noted. -Procalcitonin-negative, TSH normal.  Lactic acid-normal -Right chest wall Chemo-Port in place -Thus far cultures remain negative.  Monitor him off antibiotics/antiviral.  Rectal adenocarcinoma -Currently on chemotherapy.  Follows with Dr. Blondell Reveal is updated on the day of discharge.   Body mass index is 20.99 kg/m.         Discharge Diagnoses:  Principal Problem:   Acute encephalopathy Active Problems:   Benign prostatic hyperplasia   Mild neurocognitive disorder of unclear etiology   Rectal adenocarcinoma ([place])   Fever   Port-A-Cath in place      Consultations:  Oncology  Subjective: Feels great no complaints, tolerating orals without any issues.  His brother is at bedside.  Both his brother and patient understand that he is not allowed to drive for at least 6 months unless cleared by neurology.  Discharge Exam: Vitals:   09/13/19 2006 09/14/19 0436  BP: 113/70 119/70  Pulse: 78 75  Resp: 20 18  Temp: 99.4 F (37.4 C) 98.6 F (37 C)  SpO2: 100% 100%   Vitals:   09/13/19 0522 09/13/19 1307 09/13/19 2006 09/14/19 0436  BP: (!) 136/92 (!) 149/81 113/70 119/70  Pulse: (!) 58 82 78 75  Resp: 18 18 20 18   Temp: 98.6 F (37 C) 98.5 F (36.9 C) 99.4 F (37.4 C) 98.6 F (37 C)  TempSrc: Oral Oral Oral Oral  SpO2: 100% 100%  100% 100%  Weight:      Height:        General: Pt is alert, awake, not in acute distress Cardiovascular: RRR, S1/S2 +, no rubs, no gallops Respiratory: CTA bilaterally, no wheezing, no rhonchi Abdominal: Soft, NT, ND, bowel sounds + Extremities: no edema, no  cyanosis  Discharge Instructions   Allergies as of 09/14/2019   No Known Allergies     Medication List    TAKE these medications   levETIRAcetam 1000 MG tablet Commonly known as: Keppra Take 1 tablet (1,000 mg total) by mouth 2 (two) times daily.       Follow-up Information    [name], MD. Schedule an appointment as soon as possible for a visit in 1 week(s).   Specialty: Family Medicine Contact information: [place] [address] [phone]        Cameron Sprang, MD. Schedule an appointment as soon as possible for a visit in 2 week(s).   Specialty: Neurology Contact information: [place] [address] [phone]              No Known Allergies  You were cared for by a hospitalist during your hospital stay. If you have any questions about your discharge medications or the care you received while you were in the hospital after you are discharged, you can call the unit and asked to speak with the hospitalist on call if the hospitalist that took care of you is not available. Once you are discharged, your primary care physician will handle any further medical issues. Please note that no refills for any discharge medications will be authorized once you are discharged, as it is imperative that you return to your primary care physician (or establish a relationship with a primary care physician if you do not have one) for your aftercare needs so that they can reassess your need for medications and monitor your lab values.   Procedures/Studies: CT Head Wo Contrast  Result Date: 09/11/2019 CLINICAL DATA:  Altered mental status EXAM: CT HEAD WITHOUT CONTRAST TECHNIQUE: Contiguous axial images were obtained from the base of the skull through the vertex without intravenous contrast. COMPARISON:  None. FINDINGS: Brain: Ventricles are mildly prominent with normal appearing sulci. There is no intracranial mass,  hemorrhage, extra-axial fluid collection, or midline shift. The brain parenchyma appears unremarkable. No acute infarct demonstrable. Vascular: No hyperdense vessel. There are foci of calcification in each carotid siphon region. Skull: Bony calvarium appears intact. Sinuses/Orbits: There is mucosal thickening in several ethmoid air cells. Visualized orbits appear symmetric bilaterally. Other: Mastoid air cells are clear. There is debris in the right external auditory canal. IMPRESSION: Mild ventricular prominence with normal appearing sulci. Question early communicating hydrocephalus. Brain parenchyma appears unremarkable. No acute infarct. No mass or hemorrhage. Foci of arterial vascular calcification noted. There is mucosal thickening in several ethmoid air cells. There is probable cerumen in the right external auditory canal. Electronically Signed   By: Lowella Grip III M.D.   On: 09/11/2019 10:37   MR BRAIN WO CONTRAST  Result Date: 09/11/2019 CLINICAL DATA:  Seizure with abnormal neuro exam EXAM: MRI HEAD WITHOUT CONTRAST TECHNIQUE: Multiplanar, multiecho pulse sequences of the brain and surrounding structures were obtained without intravenous contrast. COMPARISON:  CT head 09/11/2019.  MRI head 04/03/2019 FINDINGS: Brain: Limited study. The patient was not able to complete all sequences. Images obtained are of diagnostic quality. Negative for acute infarct. Mild ventricular enlargement is  stable from prior studies and may be due to atrophy. Negative for hemorrhage or mass. Vascular: Normal arterial flow voids Skull and upper cervical spine: No focal skeletal lesion. Sinuses/Orbits: Mild mucosal edema paranasal sinuses. Negative orbit Other: None IMPRESSION: Incomplete study. Images obtained reveal no acute abnormality. Negative for acute infarct. Atrophy and mild ventricular enlargement stable from prior studies. Electronically Signed   By: Franchot Gallo M.D.   On: 09/11/2019 19:27   DG CHEST PORT 1  VIEW  Result Date: 09/11/2019 CLINICAL DATA:  Combative.  History of rectal adenocarcinoma. EXAM: PORTABLE CHEST 1 VIEW COMPARISON:  Chest x-ray dated Jul 01, 2019 FINDINGS: The right-sided Port-A-Cath is unchanged in positioning. There is a new hazy airspace opacity at the right lung base. There is no pneumothorax. No definite pleural effusion. The heart size is stable. There is no acute osseous abnormality. IMPRESSION: 1. New hazy airspace opacity at the right lung base, suspicious for pneumonia or atelectasis. 2. Stable positioning of the right-sided Port-A-Cath. Electronically Signed   By: Constance Holster M.D.   On: 09/11/2019 17:58   EEG adult  Result Date: 09/11/2019 Lora Havens, MD     09/11/2019  5:28 PM Patient Name: Carlos [name] MRN: [id number] Epilepsy Attending: Lora Havens Referring Physician/Provider: Dr Bonnielee Haff Date: 09/11/2019 Duration: 25.13 mins Patient history: 74yo M with ams. EEG to evaluate for seizure Level of alertness: Awake, asleep AEDs during EEG study: None Technical aspects: This EEG study was done with scalp electrodes positioned according to the 10-20 International system of electrode placement. Electrical activity was acquired at a sampling rate of 500Hz  and reviewed with a high frequency filter of 70Hz  and a low frequency filter of 1Hz . EEG data were recorded continuously and digitally stored. Description: The posterior dominant rhythm consists of 8 Hz activity of moderate voltage (25-35 uV) seen predominantly in posterior head regions, symmetric and reactive to eye opening and eye closing. Sleep was characterized by vertex waves, sleep spindles (12 to 14 Hz), maximal frontocentral region. Intermittent generalized 3 to 6 Hz theta-delta slowing as well as bilateral independent left> right frontotemporal spikes were noted.  Hyperventilation and photic stimulation were not performed.   ABNORMALITY -Intermittent slow, generalized -Spike, left and right  frontotemporal IMPRESSION: This study showed evidence of potential epileptogenicity arising from left more than right frontotemporal region as well as mild diffuse encephalopathy, nonspecific etiology. No seizures were seen throughout the recording. Lora Havens      The results of significant diagnostics from this hospitalization (including imaging, microbiology, ancillary and laboratory) are listed below for reference.     Microbiology: Recent Results (from the past 240 hour(s))  Urine culture     Status: None   Collection Time: 09/11/19 11:27 AM   Specimen: Urine, Clean Catch  Result Value Ref Range Status   Specimen Description   Final    URINE, CLEAN CATCH Performed at [facility], [place] [address], [place], [place] [address]    Special Requests   Final    NONE Performed at [facility], [place] [address], [place], [place] [address]    Culture   Final    NO GROWTH Performed at [facility], [place] [address], [place], [place] [address]    Report Status 09/12/2019 FINAL  Final  SARS Coronavirus 2 by RT PCR (hospital order, performed in [facility] hospital lab) Nasopharyngeal Nasopharyngeal Swab     Status: None   Collection Time: 09/11/19  1:47  PM   Specimen: Nasopharyngeal Swab  Result Value Ref Range Status   SARS Coronavirus 2 NEGATIVE NEGATIVE Final    Comment: (NOTE) SARS-CoV-2 target nucleic acids are NOT DETECTED.  The SARS-CoV-2 RNA is generally detectable in upper and lower respiratory specimens during the acute phase of infection. The lowest concentration of SARS-CoV-2 viral copies this assay can detect is 250 copies / mL. A negative result does not preclude SARS-CoV-2 infection and should not be used as the sole basis for treatment or other patient management decisions.  A negative result may occur with improper specimen collection / handling, submission of specimen other than nasopharyngeal swab, presence of viral  mutation(s) within the areas targeted by this assay, and inadequate number of viral copies (<250 copies / mL). A negative result must be combined with clinical observations, patient history, and epidemiological information.  Fact Sheet for Patients:   StrictlyIdeas.no  Fact Sheet for Healthcare Providers: BankingDealers.co.za  This test is not yet approved or  cleared by the [place] FDA and has been authorized for detection and/or diagnosis of SARS-CoV-2 by FDA under an Emergency Use Authorization (EUA).  This EUA will remain in effect (meaning this test can be used) for the duration of the COVID-19 declaration under Section 564(b)(1) of the Act, 21 U.S.C. section 360bbb-3(b)(1), unless the authorization is terminated or revoked sooner.  Performed at [facility], [place] [address], [place], [place] [address]   CSF culture     Status: None (Preliminary result)   Collection Time: 09/11/19  1:48 PM   Specimen: CSF; Cerebrospinal Fluid  Result Value Ref Range Status   Specimen Description   Final    CSF Performed at [place] [address] [place], [place] [address]    Special Requests   Final    Immunocompromised Performed at [facility], [place] [address] [place], [place] [address]    Gram Stain   Final    NO WBC SEEN NO ORGANISMS SEEN CYTOSPIN SMEAR Gram Stain Report Called to,Read Back By and Verified With: L.BALDWIN AT 6144 ON 09/11/19 BY N.THOMPSON Performed at [facility], [place] [address] [place], [place] [address]    Culture   Final    NO GROWTH 3 DAYS Performed at [facility], [place] [address] [place], [place] [address]    Report Status PENDING  Incomplete  Culture, blood (single)     Status: None (Preliminary result)   Collection Time: 09/11/19  1:48 PM   Specimen: BLOOD  Result Value Ref Range Status   Specimen Description    Final    BLOOD RIGHT ANTECUBITAL Performed at [place] [address], [place], [place] [address]    Special Requests   Final    BOTTLES DRAWN AEROBIC AND ANAEROBIC Blood Culture results may not be optimal due to an inadequate volume of blood received in culture bottles Performed at [place] [address], [place], [place] [address]    Culture   Final    NO GROWTH 3 DAYS Performed at [facility], [place] [address], [place], [place] [address]    Report Status PENDING  Incomplete     Labs: BNP (last 3 results) No results for input(s): BNP in the last 8760 hours. Basic Metabolic Panel: Recent Labs  Lab 09/10/19 0905 09/11/19 0953 09/12/19 0545 09/13/19 0555 09/14/19 0519  NA 141 138 142 139 138  K 3.9 3.6 3.0* 3.6 4.3  CL 107 105 117* 108 108  CO2 25 23 15* 20* 23  GLUCOSE 114* 116* 84 124* 97  BUN 12 16 12 11 14   CREATININE 0.84 0.89 0.70 0.82 0.75  CALCIUM 9.6 8.8* 6.9* 9.0 8.6*  MG  --   --   --  2.1 2.0   Liver Function Tests: Recent Labs  Lab 09/10/19 0905 09/11/19 0953 09/12/19 0545  AST 31 42* 40  ALT 20 31 25   ALKPHOS 82 67 51  BILITOT 0.5 0.5 0.9  PROT 6.8 6.8 4.8*  ALBUMIN 3.9 4.0 2.9*   No results for input(s): LIPASE, AMYLASE in the last 168 hours. Recent Labs  Lab 09/11/19 0954  AMMONIA 25   CBC: Recent Labs  Lab 09/10/19 0905 09/11/19 0953 09/12/19 1626 09/13/19 0555 09/14/19 0519  WBC 5.4 12.0* 5.9 7.6 5.9  NEUTROABS 2.2 10.2* 3.1  --   --   HGB 12.5* 12.3* 12.5* 13.2 11.9*  HCT 37.6* 36.7* 39.0 40.0 36.4*  MCV 87.2 89.3 92.2 89.3 90.5  PLT 172 145* 118* 123* 123*   Cardiac Enzymes: No results for input(s): CKTOTAL, CKMB, CKMBINDEX, TROPONINI in the last 168 hours. BNP: Invalid input(s): POCBNP CBG: Recent Labs  Lab 09/12/19 1238 09/13/19 2005 09/13/19 2341 09/14/19 0434 09/14/19 0744  GLUCAP 113* 120* 105* 100* 94   D-Dimer No results for input(s): DDIMER in the last  72 hours. Hgb A1c No results for input(s): HGBA1C in the last 72 hours. Lipid Profile No results for input(s): CHOL, HDL, LDLCALC, TRIG, CHOLHDL, LDLDIRECT in the last 72 hours. Thyroid function studies Recent Labs    09/12/19 0546  TSH 0.784   Anemia work up Recent Labs    09/12/19 0546 09/12/19 1626  VITAMINB12 759  --   FOLATE 76.9  --   FERRITIN 141  --   TIBC 404  --   IRON 126  --   RETICCTPCT  --  1.2   Urinalysis    Component Value Date/Time   COLORURINE YELLOW 09/11/2019 1127   APPEARANCEUR CLOUDY (A) 09/11/2019 1127   LABSPEC 1.017 09/11/2019 1127   PHURINE 5.0 09/11/2019 1127   GLUCOSEU NEGATIVE 09/11/2019 1127   [place] 09/11/2019 1127   [place] 09/11/2019 1127   [place] 09/11/2019 1127   PROTEINUR 30 (A) 09/11/2019 1127   NITRITE NEGATIVE 09/11/2019 1127   LEUKOCYTESUR NEGATIVE 09/11/2019 1127   Sepsis Labs Invalid input(s): PROCALCITONIN,  WBC,  LACTICIDVEN Microbiology Recent Results (from the past 240 hour(s))  Urine culture     Status: None   Collection Time: 09/11/19 11:27 AM   Specimen: Urine, Clean Catch  Result Value Ref Range Status   Specimen Description   Final    URINE, CLEAN CATCH Performed at [facility], [place] [address] [place], [place] [address]    Special Requests   Final    NONE Performed at [facility], [place] [address], [place], [place] [address]    Culture   Final    NO GROWTH Performed at [facility], [place] [address], [place], [place] [address]    Report Status 09/12/2019 FINAL  Final  SARS Coronavirus 2 by RT PCR (hospital order, performed in [facility] hospital lab) Nasopharyngeal Nasopharyngeal Swab     Status: None   Collection Time: 09/11/19  1:47 PM   Specimen: Nasopharyngeal Swab  Result Value Ref Range Status   SARS Coronavirus 2 NEGATIVE NEGATIVE Final    Comment: (NOTE) SARS-CoV-2 target  nucleic acids are NOT DETECTED.  The  SARS-CoV-2 RNA is generally detectable in upper and lower respiratory specimens during the acute phase of infection. The lowest concentration of SARS-CoV-2 viral copies this assay can detect is 250 copies / mL. A negative result does not preclude SARS-CoV-2 infection and should not be used as the sole basis for treatment or other patient management decisions.  A negative result may occur with improper specimen collection / handling, submission of specimen other than nasopharyngeal swab, presence of viral mutation(s) within the areas targeted by this assay, and inadequate number of viral copies (<250 copies / mL). A negative result must be combined with clinical observations, patient history, and epidemiological information.  Fact Sheet for Patients:   StrictlyIdeas.no  Fact Sheet for Healthcare Providers: BankingDealers.co.za  This test is not yet approved or  cleared by the [place] FDA and has been authorized for detection and/or diagnosis of SARS-CoV-2 by FDA under an Emergency Use Authorization (EUA).  This EUA will remain in effect (meaning this test can be used) for the duration of the COVID-19 declaration under Section 564(b)(1) of the Act, 21 U.S.C. section 360bbb-3(b)(1), unless the authorization is terminated or revoked sooner.  Performed at [facility], [place] [address] [place], [place] [address]   CSF culture     Status: None (Preliminary result)   Collection Time: 09/11/19  1:48 PM   Specimen: CSF; Cerebrospinal Fluid  Result Value Ref Range Status   Specimen Description   Final    CSF Performed at [place] [address], [place], [place] [address]    Special Requests   Final    Immunocompromised Performed at [facility], [place] [address], [place], [place] [address]    Gram Stain   Final    NO WBC SEEN NO ORGANISMS SEEN CYTOSPIN SMEAR Gram Stain  Report Called to,Read Back By and Verified With: L.BALDWIN AT 7867 ON 09/11/19 BY N.THOMPSON Performed at [facility], [place] [address], [place], [place] [address]    Culture   Final    NO GROWTH 3 DAYS Performed at [facility], [place] [address], [place], [place] [address]    Report Status PENDING  Incomplete  Culture, blood (single)     Status: None (Preliminary result)   Collection Time: 09/11/19  1:48 PM   Specimen: BLOOD  Result Value Ref Range Status   Specimen Description   Final    BLOOD RIGHT ANTECUBITAL Performed at [place] [address] [place], [place] [address]    Special Requests   Final    BOTTLES DRAWN AEROBIC AND ANAEROBIC Blood Culture results may not be optimal due to an inadequate volume of blood received in culture bottles Performed at [place] [address] [place], [place] [address]    Culture   Final    NO GROWTH 3 DAYS Performed at [facility], [place] [address], [place], [place] [address]    Report Status PENDING  Incomplete     Time coordinating discharge:  I have spent 35 minutes face to face with the patient and on the ward discussing the patients care, assessment, plan and disposition with other care givers. >50% of the time was devoted counseling the patient about the risks and benefits of treatment/Discharge disposition and coordinating care.   SIGNED:   Damita Lack, MD  [facility] 09/14/2019, 10:01 AM   If 7PM-7AM, please contact night-coverage

## 2019-09-14 NOTE — TOC Initial Note (Signed)
Transition of Care [facility]) - Initial/Assessment Note    Patient Details  Name: Carlos [name] MRN: [id number] Date of Birth: 1945-02-11  Transition of Care [facility]) CM/SW Contact:    Joaquin Courts, RN Phone Number: 09/14/2019, 12:53 PM  Clinical Narrative:        CM spoke with patient regarding [place] needs. Patient reports he goes to outpatient PT and wishes to continue this.  Patient declines [place] services.            Expected Discharge Plan: [place] Barriers to Discharge: No Barriers Identified   Patient Goals and CMS Choice Patient states their goals for this hospitalization and ongoing recovery are:: to go home CMS Medicare.gov Compare Post Acute Care list provided to:: Patient Choice offered to / list presented to : Patient  Expected Discharge Plan and Services Expected Discharge Plan: [place]   Discharge Planning Services: CM Consult Post Acute Care Choice: [place] arrangements for the past 2 months: Single Family Home Expected Discharge Date: 09/14/19               DME Arranged: N/A DME Agency: NA       HH Arranged: Patient Refused HH          Prior Living Arrangements/Services Living arrangements for the past 2 months: [place]   Patient language and need for interpreter reviewed:: Yes Do you feel safe going back to the place where you live?: Yes      Need for Family Participation in Patient Care: No (Comment) Care giver support system in place?: No (comment)   Criminal Activity/Legal Involvement Pertinent to Current Situation/Hospitalization: No - Comment as needed  Activities of Daily Living Home Assistive Devices/Equipment: Eyeglasses ADL Screening (condition at time of admission) Patient's cognitive ability adequate to safely complete daily activities?: No (patient having altered mental status) Is the patient deaf or have difficulty hearing?: No Does the patient have difficulty seeing, even  when wearing glasses/contacts?: No Does the patient have difficulty concentrating, remembering, or making decisions?: Yes Patient able to express need for assistance with ADLs?: Yes Does the patient have difficulty dressing or bathing?: No Independently performs ADLs?: Yes (appropriate for developmental age) Does the patient have difficulty walking or climbing stairs?: No Weakness of Legs: None Weakness of Arms/Hands: None  Permission Sought/Granted                  Emotional Assessment Appearance:: Appears stated age Attitude/Demeanor/Rapport: Engaged Affect (typically observed): Accepting Orientation: : Oriented to Self, Oriented to Place, Oriented to  Time, Oriented to Situation   Psych Involvement: No (comment)  Admission diagnosis:  Encephalopathy [G93.40] Fever [R50.9] Acute encephalopathy [G93.40] Patient Active Problem List   Diagnosis Date Noted  . Acute encephalopathy 09/11/2019  . Fever 09/11/2019  . Port-A-Cath in place 09/11/2019  . Rectal adenocarcinoma ([place]) 06/18/2019  . 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 Benign prostatic hyperplasia 03/30/2013   PCP:  [name], MD Pharmacy:   [facility] [place], [place] [address] Phone: [phone] Fax: [phone]     Social Determinants of Health (SDOH) Interventions    Readmission Risk Interventions No flowsheet data found.

## 2019-09-14 NOTE — Evaluation (Signed)
Physical Therapy Evaluation Patient Details Name: Carlos [name] MRN: [id number] DOB: 1946/01/17 Today's Date: 09/14/2019   History of Present Illness  74 yo male admitted with acute encephalopathy, fever. EEG abnormal-possible sz. Hx of rectal cancer-chemo, mild cog impairment  Clinical Impression  On eval, pt required Min guard-Min assist for mobility. R knee appears warm to touch, swollen, and is painful. Initially, gait was very antalgic and pt was very unsteady. With use of a RW, gait improved. R knee pain rated ~4/10 with activity. Discussed d/c plan-pt plans to return home. He is eager to return to work. Attempted to explain to pt that he is fairly unsteady due to R knee pain with activity. Cautioned him against returning to work too soon. Encouraged pt to discuss this with MD and to have MD assess R knee as well. R LE elevated and ice pack placed on R knee. Will continue to follow pt during hospital stay.     Follow Up Recommendations Home health PT;Supervision for mobility/OOB    Equipment Recommendations  Rolling walker with 5" wheels    Recommendations for Other Services       Precautions / Restrictions Precautions Precautions: Fall Restrictions Weight Bearing Restrictions: No      Mobility  Bed Mobility Overal bed mobility: Modified Independent                Transfers Overall transfer level: Needs assistance Equipment used: None;Rolling walker (2 wheeled) Transfers: Sit to/from Stand Sit to Stand: Min guard            Ambulation/Gait Ambulation/Gait assistance: Min assist Gait Distance (Feet): 100 Feet (x2) Assistive device: IV Pole;Rolling walker (2 wheeled) Gait Pattern/deviations: Step-to pattern;Step-through pattern;Antalgic     General Gait Details: Walked x 1 without UE support-moderately antalgic gait with unsteadiness. Walked x 1 with IV pole-gait remained antalgic but more steady with UE support. Walked x 1 with RW-mildly antalgic gait,  more steady.  Stairs            Wheelchair Mobility    Modified Rankin (Stroke Patients Only)       Balance Overall balance assessment: Needs assistance         Standing balance support: Bilateral upper extremity supported Standing balance-Leahy Scale: Fair                               Pertinent Vitals/Pain Pain Assessment: 0-10 Pain Score: 4  Pain Location: R knee Pain Descriptors / Indicators: Discomfort;Aching;Sore Pain Intervention(s): Monitored during session;Repositioned;Ice applied    Home Living Family/patient expects to be discharged to:: Private residence Living Arrangements: Parent   Type of Home: House Home Access: Stairs to enter;Ramped entrance Entrance Stairs-Rails: Right Entrance Stairs-Number of Steps: 3 Home Layout: Two level Home Equipment: None      Prior Function Level of Independence: Independent               Hand Dominance        Extremity/Trunk Assessment   Upper Extremity Assessment Upper Extremity Assessment: Defer to OT evaluation    Lower Extremity Assessment Lower Extremity Assessment: Generalized weakness    Cervical / Trunk Assessment Cervical / Trunk Assessment: Normal  Communication   Communication: No difficulties  Cognition Arousal/Alertness: Awake/alert Behavior During Therapy: WFL for tasks assessed/performed Overall Cognitive Status: Decreased safety awareness; decreased awareness of deficits  General Comments      Exercises     Assessment/Plan    PT Assessment Patient needs continued PT services  PT Problem List Decreased mobility;Decreased activity tolerance;Decreased balance;Pain;Decreased knowledge of use of DME;Decreased safety awareness       PT Treatment Interventions DME instruction;Gait training;Therapeutic activities;Therapeutic exercise;Patient/family education;Balance training;Functional mobility  training;Stair training    PT Goals (Current goals can be found in the Care Plan section)  Acute Rehab PT Goals 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 PT Goal Formulation: With patient Time For Goal Achievement: 09/28/19 Potential to Achieve Goals: Good    Frequency Min 3X/week   Barriers to discharge        Co-evaluation               AM-PAC PT "6 Clicks" Mobility  Outcome Measure Help needed turning from your back to your side while in a flat bed without using bedrails?: None Help needed moving from lying on your back to sitting on the side of a flat bed without using bedrails?: None Help needed moving to and from a bed to a chair (including a wheelchair)?: A Little Help needed standing up from a chair using your arms (e.g., wheelchair or bedside chair)?: A Little Help needed to walk in hospital room?: A Little Help needed climbing 3-5 steps with a railing? : A Little 6 Click Score: 20    End of Session Equipment Utilized During Treatment: Gait belt Activity Tolerance: Patient tolerated treatment well Patient left: in chair;with call bell/phone within reach;with chair alarm set   PT Visit Diagnosis: Pain;Difficulty in walking, not elsewhere classified (R26.2);Unsteadiness on feet (R26.81) Pain - Right/Left: Right Pain - part of body: Knee    Time: 1000-1028 PT Time Calculation (min) (ACUTE ONLY): 28 min   Charges:   PT Evaluation $PT Eval Low Complexity: 1 Low PT Treatments $Gait Training: 8-22 mins        Doreatha Massed, PT Acute Rehabilitation  Office: [phone] Pager: [phone]

## 2019-09-15 ENCOUNTER — Telehealth: Payer: Self-pay | Admitting: Neurology

## 2019-09-15 ENCOUNTER — Ambulatory Visit: Payer: 59

## 2019-09-15 LAB — CSF CULTURE W GRAM STAIN
Culture: NO GROWTH
Gram Stain: NONE SEEN

## 2019-09-15 LAB — HSV CULTURE AND TYPING

## 2019-09-15 NOTE — Telephone Encounter (Signed)
Patient's sister [name] called in and left a message. She stated the patient was recently hospitalized for several days after a seizure.

## 2019-09-16 ENCOUNTER — Telehealth: Payer: Self-pay | Admitting: Neurology

## 2019-09-16 LAB — CULTURE, BLOOD (SINGLE): Culture: NO GROWTH

## 2019-09-16 NOTE — Telephone Encounter (Signed)
Spoke with pts sister and told her that Dr Delice Lesch is okay with pt taking Keppra 500 mg BID if he hasn't had further seizures. Told her to call office if pt has any further seizure activity. She verbalized understanding of inst.

## 2019-09-16 NOTE — Telephone Encounter (Signed)
AccessNurse 09/16/19 @ 12:47pm:  "Caller states that she is calling in regards to her brother, he had a seizure on Friday and he was taken to the hospital and when he was discharged from the hospital he was prescribed keppra 1000 mg twice a day, however the dr had recommended 500 mg twice a day. The caller would like to find out if she can get this changed to the 500 mg twice a day as her brother is having trouble with his stomach on the higher dose. Abdominal pain and cramps. The dr he was under the care of at the hospital was Dr Virgina Norfolk.   Nurse assessment: Caller states brother was taken to the hospital Friday for a seizure. He was discharged Monday afternoon. Hospitalist said he was prescribing Keppra 500 mg BID. He was actually prescribed keppra 1000 mg BID. Having abd cramping when he takes the keppra. Wants to know if he can take keppra 500 mg BID. She is out of town not with the pt he is taking chemo."

## 2019-09-16 NOTE — Telephone Encounter (Signed)
Pls put on waitlist, thanks

## 2019-09-16 NOTE — Telephone Encounter (Signed)
If he has not had any seizures, try reducing to 500mg  BID, thanks

## 2019-09-16 NOTE — Telephone Encounter (Signed)
Pt c/o:  side effect on medication New medication started: Keppra When did they start medication?  Mon 09/14/19 When did side effects start? Wed 8/11 PM Side effects reported: stomach pain and cramping, body aches Still taking medication? Yes.     Pts sister reports that pt was in the hospital from 8/6-09/14/19 after having had a seizure. He was on IV Keppra while in the hospital. She says the discharging MD told her pt would be on Keppra 500 mg PO BID but was actually sent home with script for Keppra 1000 mg BID. Since then he has had an onset of abdominal pain/cramping as well as body soreness and she is concerned that this might be from too high of a Keppra dose. He had chemo infusion on 8/5 (oxaliplatin, leucovorin, fluorouracil). Told her that Dr Delice Lesch out of the office until [facility] but will get an update for her ASAP, she verbalized understanding. She does state that the Keppra 1000 mg tabs have a score line in case she needs to break them in half if decreasing dose advised. Told her I'd let her know an answer as soon as I can, she verbalized understanding.

## 2019-09-17 ENCOUNTER — Telehealth: Payer: Self-pay

## 2019-09-17 NOTE — Telephone Encounter (Signed)
Pts sister called with update after yesterdays communication. She says that family members report that pt had c/o abdominal pain after having to be restrained in the hospital. She says that PCP was contacted and agreed with discharge order of Keppra 1000 mg BID and she plans to cont pt at that dosing. Told her this would be relayed to Dr Delice Lesch. She is questioning refills when this script runs out, inst to contact pharmacy and they will route to [place] for refill, she verbalized understanding.

## 2019-09-23 ENCOUNTER — Ambulatory Visit (HOSPITAL_COMMUNITY)
Admission: RE | Admit: 2019-09-23 | Discharge: 2019-09-23 | Disposition: A | Discharge status: Home / Self Care | Payer: 59 | Source: Ambulatory Visit | Attending: Hematology | Admitting: Hematology

## 2019-09-23 ENCOUNTER — Encounter: Payer: Self-pay | Admitting: Neurology

## 2019-09-23 ENCOUNTER — Other Ambulatory Visit: Payer: Self-pay

## 2019-09-23 ENCOUNTER — Ambulatory Visit (INDEPENDENT_AMBULATORY_CARE_PROVIDER_SITE_OTHER): Payer: 59 | Admitting: Neurology

## 2019-09-23 VITALS — BP 141/101 | HR 88 | Ht 67.0 in | Wt 134.0 lb

## 2019-09-23 DIAGNOSIS — G3184 Mild cognitive impairment, so stated: Secondary | ICD-10-CM

## 2019-09-23 DIAGNOSIS — C20 Malignant neoplasm of rectum: Secondary | ICD-10-CM | POA: Diagnosis present

## 2019-09-23 IMAGING — CT CT ABD-PELV W/ CM
2 of 5 series · 15 of 46 positions shown, 17 images · IV contrast (OMNIPAQUE)
Comparison: PET-CT [DATE]

CLINICAL DATA: Restaging of rectal cancer diagnosed about 3 months
ago. Chemotherapy in progress.

EXAM:
CT ABDOMEN AND PELVIS WITH CONTRAST
TECHNIQUE: Multidetector CT imaging of the abdomen and pelvis was performed
using the standard protocol following bolus administration of
intravenous contrast.
CONTRAST:  100mL OMNIPAQUE IOHEXOL 300 MG/ML  SOLN

[Series 2: axial st · axial · 0.71mm/px · z∈[-391,-51]mm · 12 of 82 slices shown, 14 images]
[im 7/82  soft-tissue]
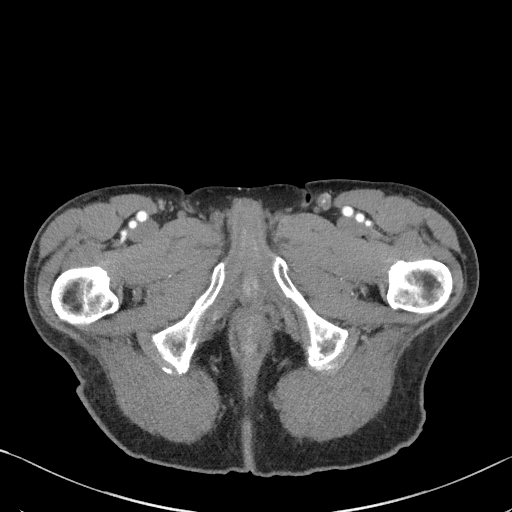
[im 7/82  bone]
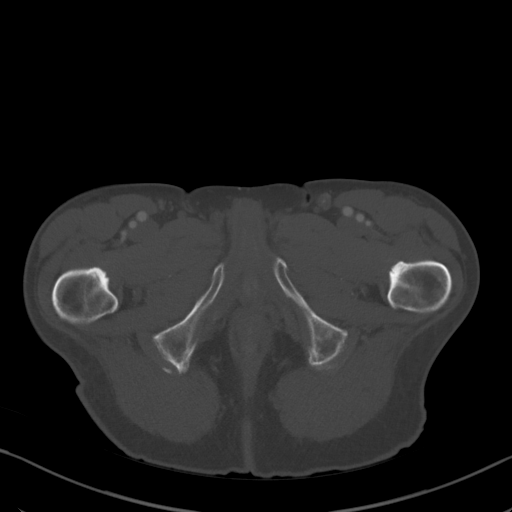
[im 13/82  soft-tissue]
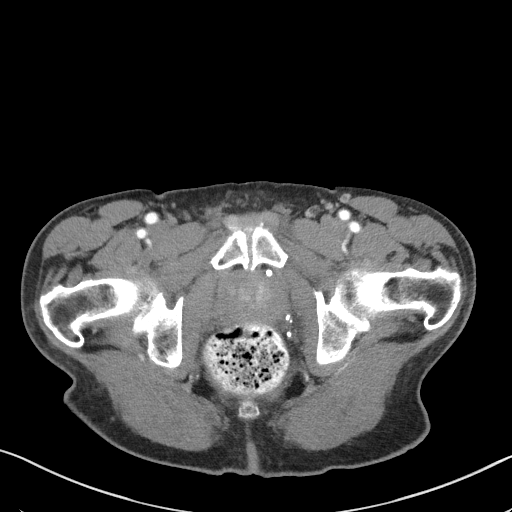
[im 19/82  soft-tissue]
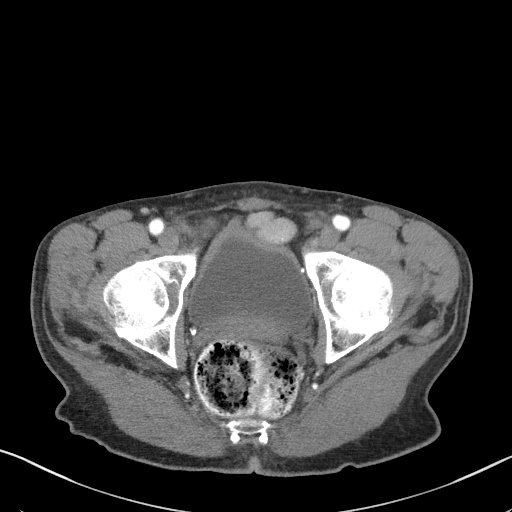
[im 25/82  soft-tissue]
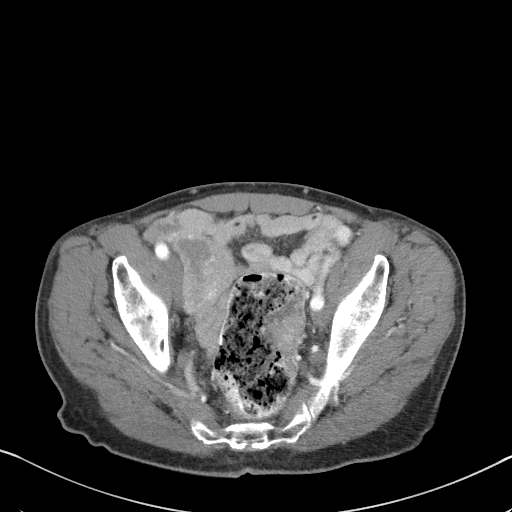
[im 32/82  soft-tissue]
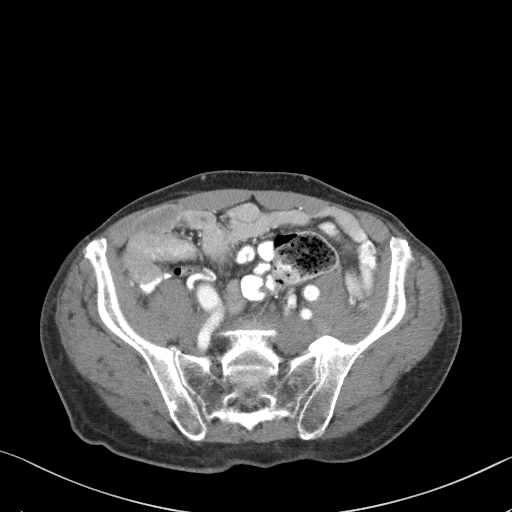
[im 38/82  soft-tissue]
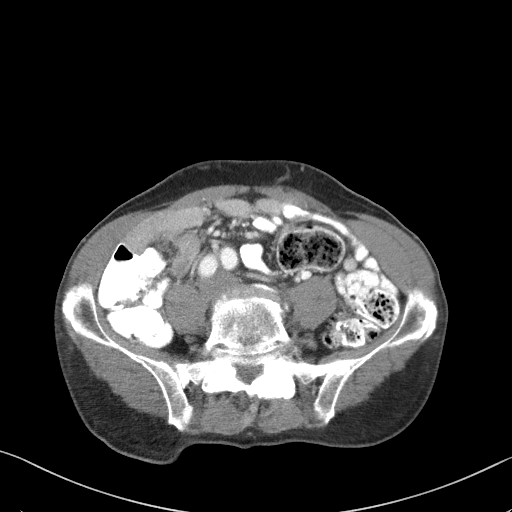
[im 44/82  soft-tissue]
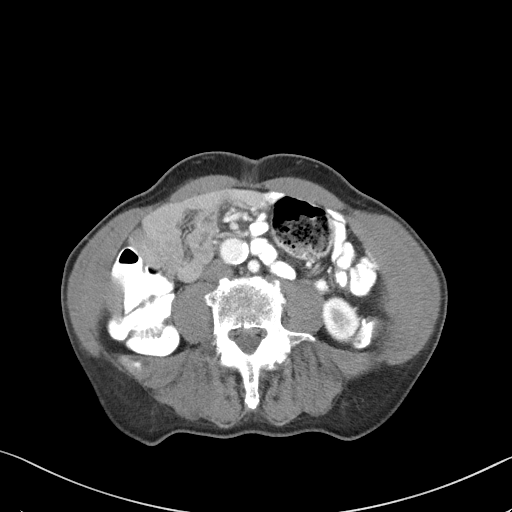
[im 50/82  soft-tissue]
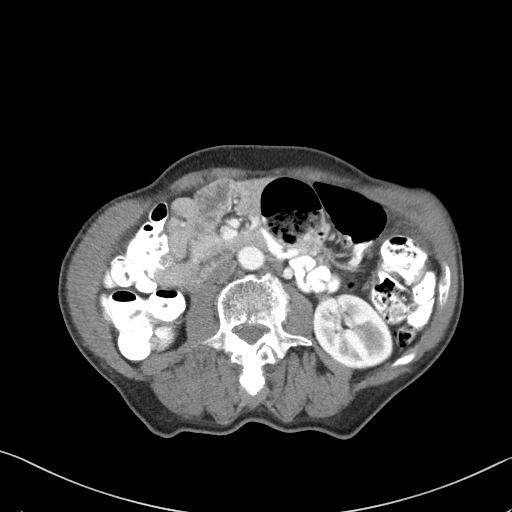
[im 57/82  soft-tissue]
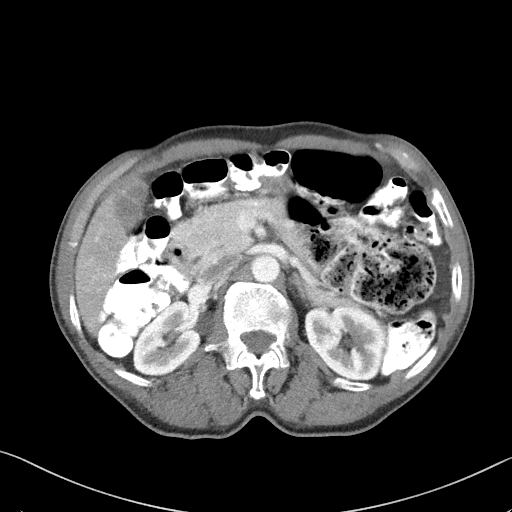
[im 57/82  bone]
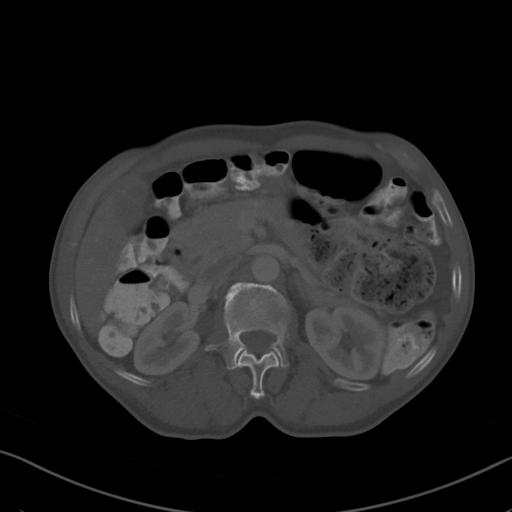
[im 63/82  soft-tissue]
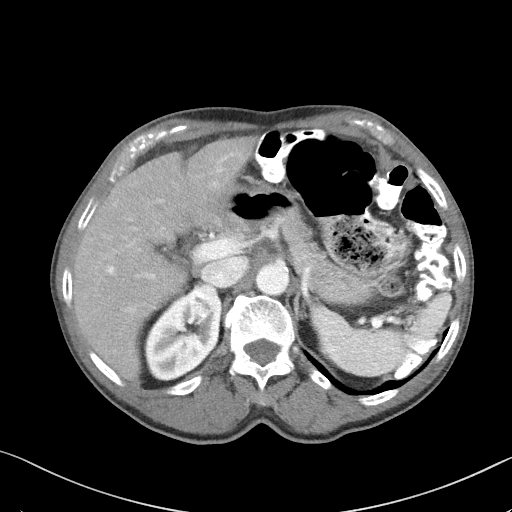
[im 69/82  soft-tissue]
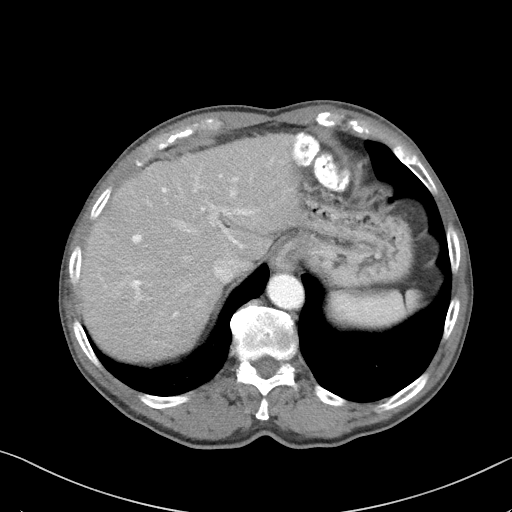
[im 75/82  soft-tissue]
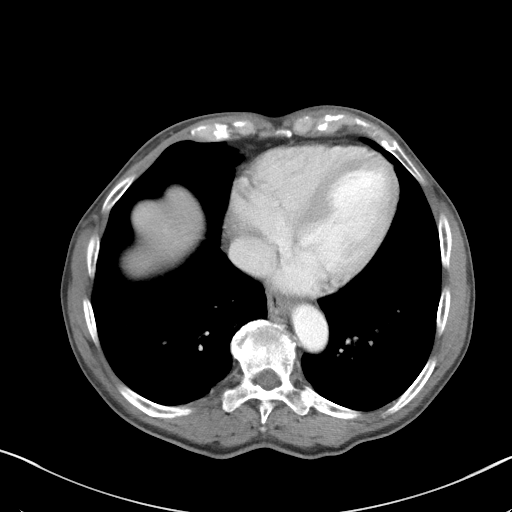

[Series 4: coronal st · coronal · 0.79mm/px · 3 of 79 slices shown]
[im 27/79  soft-tissue]
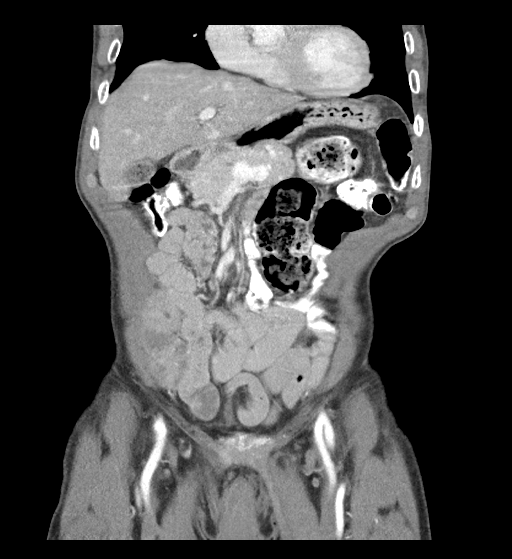
[im 35/79  soft-tissue]
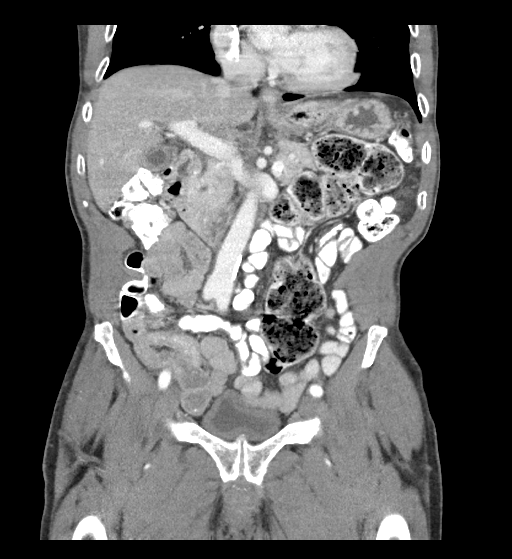
[im 44/79  soft-tissue]
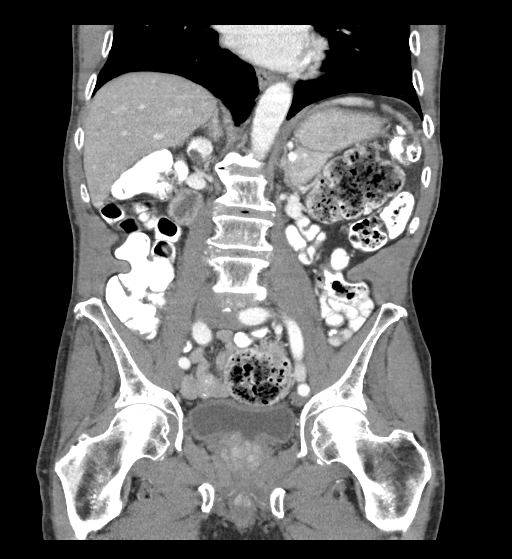

[15 of 46 positions shown; findings below may reference images not displayed]

FINDINGS: Lower chest: Stable scarring in the posterior basal segments of both
lower lobes.

Hepatobiliary: Multiple large gallstones filling the gallbladder.
Nitrogen gas phenomenon within the stones. No hepatic mass
identified. No significant focal hepatic lesion is observed. No
biliary dilatation.

Pancreas: Unremarkable

Spleen: Unremarkable

Adrenals/Urinary Tract: Complex bilateral renal lesions are similar
to prior exams. The hyperdense small right kidney upper pole and
larger left mid kidney lesions are stable and probably hemorrhagic
cysts. There is a 4 mm left kidney lower pole nonobstructive renal
calculus, and a septated cystic lesion anteriorly in the right mid
kidney both which appear stable.

Stomach/Bowel: Solid-appearing left eccentric upper rectal mass
measures about 6.7 by 2.9 cm on image 58/2. This is difficult to
compare directly to the prior exam given differences in orientation
and degree of distension of the rectum. There is some prominence of
stool in the sigmoid colon and rectum on today's examination, but
the mass is not obstructive.

Vascular/Lymphatic: Aortoiliac atherosclerotic vascular disease.

Reproductive: Mild prostatomegaly with nodular prominence the upper
margin of the prostate gland.

Other: No supplemental non-categorized findings.

Musculoskeletal: Bridging spurring of the left sacroiliac joint.
Grade 1 degenerative anterolisthesis of L5 on S1. Lumbar spondylosis
and degenerative disc disease causing multilevel foraminal
impingement.
IMPRESSION: 1. Solid-appearing left eccentric upper rectal mass measures about
6.7 by 2.9 cm. This is difficult to compare directly to the prior
exam given differences in orientation and degree of distension of
the rectum. There is some prominence of stool in the sigmoid colon
and rectum on today's examination, but the mass is not obstructive.
2. Other imaging findings of potential clinical significance:
Cholelithiasis. Complex bilateral renal lesions are similar to prior
exams. Nonobstructive left nephrolithiasis. Mild prostatomegaly with
nodular prominence the upper margin of the prostate gland. Lumbar
spondylosis and degenerative disc disease causing multilevel
foraminal impingement.
3. Aortic atherosclerosis.

Aortic Atherosclerosis ([5O]-[5O]).

## 2019-09-23 MED ORDER — SODIUM CHLORIDE (PF) 0.9 % IJ SOLN
INTRAMUSCULAR | Status: AC
  Filled 2019-09-23: qty 50

## 2019-09-23 MED ORDER — IOHEXOL 300 MG/ML  SOLN
100.0000 mL | Freq: Once | INTRAMUSCULAR | Status: AC | PRN
  Administered 2019-09-23: 100 mL via INTRAVENOUS

## 2019-09-23 MED ORDER — LEVETIRACETAM 1000 MG PO TABS: 1000.0000 mg | ORAL_TABLET | Freq: Two times a day (BID) | ORAL | 11 refills | Status: DC

## 2019-09-23 NOTE — Patient Instructions (Addendum)
1. Continue Levetiracetam 1000mg : take 1 tablet twice a day  2. Restart speech therapy  3. Proceed with repeat memory testing scheduled for January 2022  4. Follow-up after memory testing, call for any changes   Seizure Precautions: 1. If medication has been prescribed for you to prevent seizures, take it exactly as directed.  Do not stop taking the medicine without talking to your doctor first, even if you have not had a seizure in a long time.   2. Avoid activities in which a seizure would cause danger to yourself or to others.  Don't operate dangerous machinery, swim alone, or climb in high or dangerous places, such as on ladders, roofs, or girders.  Do not drive unless your doctor says you may.  3. If you have any warning that you may have a seizure, lay down in a safe place where you can't hurt yourself.    4.  No driving for 6 months from last seizure, as per [facility].   Please refer to the following link on the [facility] website for more information: http://www.epilepsyfoundation.org/answerplace/Social/driving/drivingu.cfm   5.  Maintain good sleep hygiene. Avoid alcohol..  6.  Contact your doctor if you have any problems that may be related to the medicine you are taking.  7.  Call 911 and bring the patient back to the ED if:        A.  The seizure lasts longer than 5 minutes.       B.  The patient doesn't awaken shortly after the seizure  C.  The patient has new problems such as difficulty seeing, speaking or moving  D.  The patient was injured during the seizure  E.  The patient has a temperature over 102 F (39C)  F.  The patient vomited and now is having trouble breathing

## 2019-09-23 NOTE — Progress Notes (Signed)
NEUROLOGY FOLLOW UP OFFICE NOTE  Carlos [name] [id number] [birth year]  HISTORY OF PRESENT ILLNESS: I had the pleasure of seeing Carlos [name] in follow-up in the neurology clinic on 09/23/2019.  The patient was last seen 6 months ago for memory loss. He is again accompanied by his sister Carlos [name] who helps supplement the history today.  Records and images were personally reviewed where available.  Since his last visit, he was found to have a rectal adenocarcinoma in April 2021. He is currently on chemotherapy with plans for radiation then surgery. He was brought to [facility] on 09/14/19 for confusion, fever, leukocytosis. His mother's caregiver heard him banging around in his room and saw him flailing around then confused, trying to get out. He was brought to [facility] where they had to get the police to restrain him. MRI was incomplete but no acute changes seen. He had a lumbar puncture which was normal. TSH, B12, RPR, HIV negative. His EEG was abnormal showing intermittent diffuse slowing with frequent left frontotemporal spike waves and occasional right frontotemporal sharp waves. He was discharged home on Levetiracetam 1066m BID which he is tolerating without side effects. No further confusional episodes. His sister denies any staring/unresponsive episodes. He denies any gaps in time. He denies any olfactory/gustatory hallucinations, focal numbness/tingling/weakness, myoclonic jerks. No headaches, dizziness, vision changes. Family has not noticed any change in memory with initiation of Levetiracetam. 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 He is currently on an OTC memory supplement called Mindworks which may be helping, but Carlos [name] does report he did well with speech therapy. Family has to remind him to take his medication regularly. He has not been driving. He cut down hours at work. He is feeling stiff  today, he had to go home early from work last night because he was having trouble standing, feeling weak, "not in good shape."    History on Initial Assessment 11/07/2018: This is a pleasant 74 year old right-handed man with no significant past medical history presenting for evaluation of memory loss and frequent falls. His sister is present to provide additional information. He thinks his memory is reasonable. He states family has noticed more changes, they ask him things and "I don't give good answers." He endorses short-term memory changes. He lives with his 96 year old mother who has 4 caregivers. 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 He denies getting lost driving. He denies any missed bill payments. He is not on any medications. He denies any difficulties at work, he states it is routine work. His sister started noticing changes a year ago, he would forget conversations. He would forget he had already finished a task, for instance that he had already came from the grocery store. She feels symptoms worsened in the past 5-6 months. Last month, he was visiting another sister and forgot the last portion of the trip, he did not get to his destination and just went home. He has told family he has gotten lost coming home from work, he arrived home an hour later (he has been in the same place for 7 years). His family is also concerned about an increase in frequency of falls. He has had 3 falls in the past 6 months. He reports injuring his foot and hitting his head with a fall in April, then hurting his back in July. He thinks it is due  to clumsiness. No loss of consciousness. They are concerned about the possibility of a brain tumor, their father had a brain tumor. There is a history of memory loss in his mother and younger brother (secondary to stroke). He denies any history of significant head injuries or alcohol use.   He denies any headaches, dizziness, diplopia, dysarthria, dysphagia, neck/back pain,  focal numbness/tingling/weakness, bowel/bladder dysfunction, anosmia, or tremors. He felt unwell last night and BP was 151/86, he went home early from work. He states he is on his feet 12 hours a day, and he is now required to work 6 days a week (mandatory due to Covid-19 changes), he was previously working 5 days a week. His sister is concerned he is not eating well, mostly sugars and hotdogs. He is overly tired and sleepy some days. Mood is good, he has "always been hyper, but now it it more" per sister. No hallucinations or paranoia.   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 There were "impairments in retrieval/consolidation aspects of verbal memory, working memory, confrontation naming, and complex visuoconstructional abilities. Additional variability was seen across processing speed and executive functioning. Visual encoding (i.e., learning) was also a relative weakness. Findings could suggest prominent left temporal dysfunction assuming normal lateralization in this right-handed individual. However, other aspects of expressive language (i.e., verbal fluency) were within normal limits. The aforementioned deficits, coupled with additional weaknesses across aspects of executive functioning and more advanced visuoconstructional abilities could be concerning for Alzheimer's disease. However appropriate retrieval/consolidation aspects of visual memory, coupled with strong semantic fluency scores, is inconsistent with what would typically be expected."   PAST MEDICAL HISTORY: Past Medical History:  Diagnosis Date  . Anxiety   . Benign prostatic hyperplasia 03/30/2013   10/1 IMO update  . Cancer ([place])   . Mild neurocognitive disorder of  unclear etiology 01/23/2019    MEDICATIONS: Current Outpatient Medications on File Prior to Visit  Medication Sig Dispense Refill  . levETIRAcetam (KEPPRA) 1000 MG tablet Take 1 tablet (1,000 mg total) by mouth 2 (two) times daily. 60 tablet 0   Current Facility-Administered Medications on File Prior to Visit  Medication Dose Route Frequency Provider Last Rate Last Admin  . sodium chloride (PF) 0.9 % injection             ALLERGIES: No Known Allergies  FAMILY HISTORY: Family History  Problem Relation Age of Onset  . Osteoporosis Mother   . Dementia Mother   . Diabetes Mother   . Brain cancer Father     SOCIAL HISTORY: Social History   Socioeconomic History  . Marital status: Single    Spouse name: Not on file  . Number of children: 0  . Years of education: 16  . Highest education level: Bachelor's degree (e.g., BA, AB, BS)  Occupational History  . Not on file  Tobacco Use  . Smoking status: Never Smoker  . Smokeless tobacco: Never Used  Vaping Use  . Vaping Use: Never used  Substance and Sexual Activity  . Alcohol use: No  . Drug use: No  . Sexual activity: Not Currently  Other Topics Concern  . Not on file  Social History Narrative   4 year College- history major      Pts mother lives with him  Right handed   Two story home   Drinks tea   Used to live in [place]   Social Determinants of Health   Financial Resource Strain:   . Difficulty of Paying Living Expenses:   Food Insecurity:   . Worried About Charity fundraiser in the Last Year:   . Arboriculturist in the Last Year:   Transportation Needs:   . Film/video editor (Medical):   Marland Kitchen Lack of Transportation (Non-Medical):   Physical Activity:   . Days of Exercise per Week:   . Minutes of Exercise per Session:   Stress:   . Feeling of Stress :   Social Connections:   . Frequency of Communication with Friends and Family:   . Frequency of Social Gatherings with Friends and Family:   .  Attends Religious Services:   . Active Member of Clubs or Organizations:   . Attends Archivist Meetings:   Marland Kitchen Marital Status:   Intimate Partner Violence:   . Fear of Current or Ex-Partner:   . Emotionally Abused:   Marland Kitchen Physically Abused:   . Sexually Abused:     PHYSICAL EXAM: Vitals:   09/23/19 1441  BP: (!) 141/101  Pulse: 88  SpO2: 99%   General: No acute distress Head:  Normocephalic/atraumatic Skin/Extremities: No rash, no edema Neurological Exam: alert and oriented to person, place, and time. No aphasia or dysarthria. Fund of knowledge is appropriate.  Recent and remote memory are impaired. Attention and concentration are reduced.  Cranial nerves: Pupils equal, round. Extraocular movements intact with no nystagmus. Visual fields full.. No facial asymmetry.  Motor: Bulk and tone normal, muscle strength 5/5 throughout with no pronator drift. Finger to nose testing intact.  Gait slightly unsteady, no ataxia.    IMPRESSION: This is a pleasant 74 yo RH man with worsening memory. MRI brain no acute changes, there is mild diffuse atrophy and chronic microvascular disease. Neuropsychological testing in December 2020 showed Mild Neurocognitive Disorder, etiology unclear, he had impairments in retrieval/consolidation of verbal memory, working memory, confrontation naming, and complex visuoconstructional abilities. It was noted that findings could suggest prominent left temporal dysfunction. He was admitted for a confusional episode last 09/14/19 with EEG showing frequent left frontotemporal epileptiform discharges, occasional right temporal epileptiform discharges. He is now on Levetiracetam 1000mg  BID, no further confusion but memory still the same. We discussed findings at length, etiology of seizures unknown, MRI brain no significant abnormalities. We discussed [place] driving laws to stop driving until seizure-free for 6 months. Speech therapy was helpful for cognitive therapy, referral  will be sent again. Proceed with repeat Neuropsychological testing in January 2022, follow-up after testing. They know to call for any changes.    Thank you for allowing me to participate in his care.  Please do not hesitate to call for any questions or concerns.   Ellouise Newer, M.D.   CC: Dr. Orland Mustard

## 2019-09-24 ENCOUNTER — Inpatient Hospital Stay: Payer: 59 | Admitting: Nurse Practitioner

## 2019-09-24 ENCOUNTER — Inpatient Hospital Stay: Payer: 59

## 2019-09-24 ENCOUNTER — Other Ambulatory Visit: Payer: Self-pay

## 2019-09-24 ENCOUNTER — Inpatient Hospital Stay: Payer: 59 | Admitting: Nutrition

## 2019-09-24 VITALS — BP 143/86 | HR 76 | Temp 97.0°F | Resp 16 | Ht 67.0 in | Wt 133.4 lb

## 2019-09-24 DIAGNOSIS — Z95828 Presence of other vascular implants and grafts: Secondary | ICD-10-CM

## 2019-09-24 DIAGNOSIS — C20 Malignant neoplasm of rectum: Secondary | ICD-10-CM

## 2019-09-24 DIAGNOSIS — Z23 Encounter for immunization: Secondary | ICD-10-CM | POA: Diagnosis not present

## 2019-09-24 DIAGNOSIS — Z5111 Encounter for antineoplastic chemotherapy: Secondary | ICD-10-CM | POA: Diagnosis present

## 2019-09-24 DIAGNOSIS — Z79899 Other long term (current) drug therapy: Secondary | ICD-10-CM | POA: Diagnosis not present

## 2019-09-24 LAB — CBC WITH DIFFERENTIAL (CANCER CENTER ONLY)
Abs Immature Granulocytes: 0.02 10*3/uL (ref 0.00–0.07)
Basophils Absolute: 0 10*3/uL (ref 0.0–0.1)
Basophils Relative: 0 %
Eosinophils Absolute: 0.1 10*3/uL (ref 0.0–0.5)
Eosinophils Relative: 1 %
HCT: 35.5 % — ABNORMAL LOW (ref 39.0–52.0)
Hemoglobin: 11.9 g/dL — ABNORMAL LOW (ref 13.0–17.0)
Immature Granulocytes: 0 %
Lymphocytes Relative: 47 %
Lymphs Abs: 2.2 10*3/uL (ref 0.7–4.0)
MCH: 29.5 pg (ref 26.0–34.0)
MCHC: 33.5 g/dL (ref 30.0–36.0)
MCV: 88.1 fL (ref 80.0–100.0)
Monocytes Absolute: 0.7 10*3/uL (ref 0.1–1.0)
Monocytes Relative: 15 %
Neutro Abs: 1.8 10*3/uL (ref 1.7–7.7)
Neutrophils Relative %: 37 %
Platelet Count: 176 10*3/uL (ref 150–400)
RBC: 4.03 MIL/uL — ABNORMAL LOW (ref 4.22–5.81)
RDW: 15 % (ref 11.5–15.5)
WBC Count: 4.8 10*3/uL (ref 4.0–10.5)
nRBC: 0 % (ref 0.0–0.2)

## 2019-09-24 LAB — CMP (CANCER CENTER ONLY)
ALT: 26 U/L (ref 0–44)
AST: 36 U/L (ref 15–41)
Albumin: 3.6 g/dL (ref 3.5–5.0)
Alkaline Phosphatase: 80 U/L (ref 38–126)
Anion gap: 10 (ref 5–15)
BUN: 6 mg/dL — ABNORMAL LOW (ref 8–23)
CO2: 26 mmol/L (ref 22–32)
Calcium: 9.8 mg/dL (ref 8.9–10.3)
Chloride: 105 mmol/L (ref 98–111)
Creatinine: 0.84 mg/dL (ref 0.61–1.24)
GFR, Est AFR Am: >60 mL/min (ref >60)
GFR, Estimated: >60 mL/min (ref >60)
Glucose, Bld: 123 mg/dL — ABNORMAL HIGH (ref 70–99)
Potassium: 3.9 mmol/L (ref 3.5–5.1)
Sodium: 141 mmol/L (ref 135–145)
Total Bilirubin: 0.4 mg/dL (ref 0.3–1.2)
Total Protein: 6.7 g/dL (ref 6.5–8.1)

## 2019-09-24 LAB — CEA (IN HOUSE-CHCC): CEA (CHCC-In House): 1.57 ng/mL (ref 0.00–5.00)

## 2019-09-24 MED ORDER — SODIUM CHLORIDE 0.9% FLUSH
10.0000 mL | INTRAVENOUS | Status: DC | PRN
  Filled 2019-09-24: qty 10

## 2019-09-24 MED ORDER — OXALIPLATIN CHEMO INJECTION 100 MG/20ML
60.0000 mg/m2 | Freq: Once | INTRAVENOUS | Status: AC
  Administered 2019-09-24: 100 mg via INTRAVENOUS
  Filled 2019-09-24: qty 20

## 2019-09-24 MED ORDER — FLUOROURACIL CHEMO INJECTION 2.5 GM/50ML
400.0000 mg/m2 | Freq: Once | INTRAVENOUS | Status: AC
  Administered 2019-09-24: 650 mg via INTRAVENOUS
  Filled 2019-09-24: qty 13

## 2019-09-24 MED ORDER — SODIUM CHLORIDE 0.9 % IV SOLN
10.0000 mg | Freq: Once | INTRAVENOUS | Status: AC
  Administered 2019-09-24: 10 mg via INTRAVENOUS
  Filled 2019-09-24: qty 10

## 2019-09-24 MED ORDER — ALTEPLASE 2 MG IJ SOLR
2.0000 mg | Freq: Once | INTRAMUSCULAR | Status: DC | PRN
  Filled 2019-09-24: qty 2

## 2019-09-24 MED ORDER — PALONOSETRON HCL INJECTION 0.25 MG/5ML
INTRAVENOUS | Status: AC
  Filled 2019-09-24: qty 5

## 2019-09-24 MED ORDER — ACETAMINOPHEN 325 MG PO TABS
ORAL_TABLET | ORAL | Status: AC
  Filled 2019-09-24: qty 2

## 2019-09-24 MED ORDER — SODIUM CHLORIDE 0.9 % IV SOLN
2400.0000 mg/m2 | INTRAVENOUS | Status: DC
  Administered 2019-09-24: 4050 mg via INTRAVENOUS
  Filled 2019-09-24: qty 81

## 2019-09-24 MED ORDER — HEPARIN SOD (PORK) LOCK FLUSH 100 UNIT/ML IV SOLN
250.0000 [IU] | INTRAVENOUS | Status: DC | PRN
  Filled 2019-09-24: qty 5

## 2019-09-24 MED ORDER — ANTICOAGULANT SODIUM CITRATE 4% (200MG/5ML) IV SOLN
5.0000 mL | Status: DC | PRN
  Filled 2019-09-24: qty 5

## 2019-09-24 MED ORDER — ACETAMINOPHEN 325 MG PO TABS
650.0000 mg | ORAL_TABLET | Freq: Once | ORAL | Status: AC
  Administered 2019-09-24: 650 mg via ORAL

## 2019-09-24 MED ORDER — HEPARIN SOD (PORK) LOCK FLUSH 100 UNIT/ML IV SOLN
500.0000 [IU] | INTRAVENOUS | Status: DC | PRN
  Filled 2019-09-24: qty 5

## 2019-09-24 MED ORDER — HEPARIN SOD (PORK) LOCK FLUSH 100 UNIT/ML IV SOLN
500.0000 [IU] | Freq: Once | INTRAVENOUS | Status: DC | PRN
  Filled 2019-09-24: qty 5

## 2019-09-24 MED ORDER — PALONOSETRON HCL INJECTION 0.25 MG/5ML
0.2500 mg | Freq: Once | INTRAVENOUS | Status: AC
  Administered 2019-09-24: 0.25 mg via INTRAVENOUS

## 2019-09-24 MED ORDER — LEUCOVORIN CALCIUM INJECTION 350 MG
400.0000 mg/m2 | Freq: Once | INTRAVENOUS | Status: AC
  Administered 2019-09-24: 672 mg via INTRAVENOUS
  Filled 2019-09-24: qty 33.6

## 2019-09-24 MED ORDER — DEXTROSE 5 % IV SOLN
Freq: Once | INTRAVENOUS | Status: AC
  Filled 2019-09-24: qty 250

## 2019-09-24 MED ORDER — SODIUM CHLORIDE 0.9% FLUSH
10.0000 mL | INTRAVENOUS | Status: AC | PRN
  Administered 2019-09-24: 10 mL
  Filled 2019-09-24: qty 10

## 2019-09-24 NOTE — Patient Instructions (Signed)

## 2019-09-24 NOTE — Progress Notes (Addendum)
Carlos [name]   [phone]) [phone] [phone]) [phone]   Clinic Follow up Note   Patient Care Team: [name], MD as PCP - General (Family Medicine) Cameron Sprang, MD as Consulting Physician (Neurology) Jonnie Finner, RN as Oncology Nurse Navigator Truitt Merle, MD as Consulting Physician (Hematology) Ronnette Juniper, MD as Consulting Physician (Gastroenterology) 09/25/2019  CHIEF COMPLAINT: F/u rectal cancer, hospital admission   SUMMARY OF ONCOLOGIC HISTORY: Oncology History Overview Note  Cancer Staging Rectal adenocarcinoma [facility]) Staging form: Colon and Rectum, AJCC 8th Edition - Clinical stage from 06/12/2019: cT4, cN1 - Unsigned    Rectal adenocarcinoma ([place])  05/29/2019 Procedure   Colonoscopy per Dr. Therisa Doyne A digital rectal exam was normal  frond-like fungating infiltrative non-obstructing large mass in the rectum. The mass was partially circumferential, measured 5 cm in length. The colonoscopy also showed 11 sessile polyps in the transverse and asecnding colon and hepatic flexure.    05/29/2019 Initial Biopsy   Pathology on the rectal biopsy showed at least intramucosal adenocarcinoma involving tubulovillous adenoma with high grade dysplasia.   06/05/2019 Imaging   CT CAP IMPRESSION: 1. Cholelithiasis and gallbladder wall thickening with equivocal pericholecystic inflammation. Acute cholecystitis not excluded. If there is clinical suspicion for acute cholecystitis, recommend ultrasound evaluation. 2. Approximately 5 cm irregular mass within the distal sigmoid colon with possible extension through the wall. No definite abnormal adjacent lymph nodes. 3. Mild omental haziness-nonspecific. Metastatic disease not excluded. 4. Indeterminate 3-4 mm RIGHT pulmonary nodules which could represent metastatic disease. These are too small for PET CT evaluation. Consider CT follow-up. 5. 2.5 cm irregular cystic mass within the RIGHT kidney. Elective MRI  recommended for further evaluation. 6. 4 mm nonobstructing LEFT LOWER pole renal calculus. 7. Coronary artery disease. 8. Aortic Atherosclerosis (ICD10-I70.0).   06/12/2019 Imaging   Staging MRI pelvis  FINDINGS: TUMOR LOCATION Tumor distance from Anal Verge/Skin Surface:  11.4 cm Tumor distance to Internal Anal Sphincter: 6.5 cm TUMOR DESCRIPTION Circumferential Extent: 100% Tumor Length: 8.3 cm T - CATEGORY Extension through Muscularis Propria: Yes>14m=T3d Shortest Distance of any tumor/node from Mesorectal Fascia: 0 mm, along the left lateral and posterior walls (tumor abuts the left lateral pelvic sidewall and sacrum) Extramural Vascular Invasion/Tumor Thrombus: No Invasion of Anterior Peritoneal Reflection: No Involvement of Adjacent Organs or Pelvic Sidewall: Yes, involving the left lateral pelvic sidewall =T4 Levator Ani Involvement: No N - CATEGORY Mesorectal Lymph Nodes >=599m 2=N1 Extra-mesorectal Lymphadenopathy: No Other:  None. IMPRESSION: Rectal adenocarcinoma T stage: T4 Rectal adenocarcinoma N stage:  N1 Distance from tumor to the internal anal sphincter is 6.5 cm.   06/18/2019 Initial Diagnosis   [place]al [place]  07/02/2019 -  Chemotherapy   Total Neoadjuvant FOLFOX q2weeks starting 07/02/19 for 4 months, followed by CC-ChemoRT   07/13/2019 PET scan   IMPRESSION: 1. Known rectal mass is intensely hypermetabolic compatible with primary rectal adenocarcinoma. No findings of FDG avid nodal metastasis or distant metastatic disease. 2. Hyperdense kidney cysts are identified bilaterally favored to represent hemorrhagic cyst. 3. Nonobstructing left renal calculi. 4. Aortic atherosclerosis, in addition to lad coronary artery disease. Please note that although the presence of coronary artery calcium documents the presence of coronary artery disease, the severity of this disease and any potential stenosis cannot be assessed on this non-gated CT  examination. Assessment for potential risk factor modification, dietary therapy or pharmacologic therapy may be warranted, if clinically indicated. Aortic Atherosclerosis (ICD10-I70.0). 5. Gallstones.     CURRENT THERAPY: Total Neoadjuvant FOLFOX  q2weeks starting 07/02/19 for 4 months, followed by ccChemoRT  INTERVAL HISTORY: Carlos [name] returns for f/u and treatment as scheduled. He was last seen 09/10/19 and completed cycle 6 FOLFOX. He was found to be confused and disoriented on 09/11/19 and was admitted for altered mental status. LP negative, MRI with no acute changes, EEG showed possible epileptic spike and he was started on Keppra. Infectious etiology ruled out. He has been seen by neuro in the past for memory issues.   Today, he presents by himself.  He feels back to his normal self.  He has a "nagging soreness in the rear."  This began 2 to 3 days ago, he did not have this pain prior to starting treatment.  He denies constipation, rectal bleeding.  He does have some soreness with BM, pain is 3 out of 10 today.  He does not really notice cold sensitivity, palms are dark with "dullness or tightness" in his hands.  He is able to function normally.  His energy and appetite are normal, he went back to work 1 or 2 days this week.  He denies fever, chills, cough, chest pain, dyspnea, leg edema, nausea or vomiting.   MEDICAL HISTORY:  Past Medical History:  Diagnosis Date  . Anxiety   . Benign prostatic hyperplasia 03/30/2013   10/1 IMO update  . Cancer ([place])   . 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 PORTACATH PLACEMENT N/A 07/01/2019   Procedure: PORT ULTRASOUND GUIDED PLACEMENT;  Surgeon: Alphonsa Overall, MD;  Location: [facility];  Service: General;  Laterality: N/A;  . PROSTATE SURGERY  2004  . TONSILECTOMY, ADENOIDECTOMY, BILATERAL MYRINGOTOMY AND TUBES      I have reviewed the social history and  family history with the patient and they are unchanged from previous note.  ALLERGIES:  has No Known Allergies.  MEDICATIONS:  Current Outpatient Medications  Medication Sig Dispense Refill  . levETIRAcetam (KEPPRA) 1000 MG tablet Take 1 tablet (1,000 mg total) by mouth 2 (two) times daily. 60 tablet 11   No current facility-administered medications for this visit.    PHYSICAL EXAMINATION: ECOG PERFORMANCE STATUS: 1 - Symptomatic but completely ambulatory  Vitals:   09/24/19 0910  BP: (!) 143/86  Pulse: 76  Resp: 16  Temp: (!) 97 F (36.1 C)  SpO2: 100%   Filed Weights   09/24/19 0910  Weight: 133 lb 6.4 oz (60.5 kg)    GENERAL:alert, no distress and comfortable SKIN: no rash to exposed skin  EYES: sclera clear OROPHARYNX: no thrush or ulcers LUNGS: clear with normal breathing effort HEART: regular rate & rhythm, no lower extremity edema ABDOMEN:abdomen soft, non-tender and normal bowel sounds NEURO: alert & oriented x 3 with fluent speech, no focal motor/sensory deficits  LABORATORY DATA:  I have reviewed the data as listed CBC Latest Ref Rng & Units 09/24/2019 09/14/2019 09/13/2019  WBC 4.0 - 10.5 K/uL 4.8 5.9 7.6  Hemoglobin 13.0 - 17.0 g/dL 11.9(L) 11.9(L) 13.2  Hematocrit 39 - 52 % 35.5(L) 36.4(L) 40.0  Platelets 150 - 400 K/uL 176 123(L) 123(L)     CMP Latest Ref Rng & Units 09/24/2019 09/14/2019 09/13/2019  Glucose 70 - 99 mg/dL 123(H) 97 124(H)  BUN 8 - 23 mg/dL 6(L) 14 11  Creatinine 0.61 - 1.24 mg/dL 0.84 0.75 0.82  Sodium 135 - 145 mmol/L 141 138 139  Potassium 3.5 - 5.1 mmol/L 3.9 4.3 3.6  Chloride  98 - 111 mmol/L 105 108 108  CO2 22 - 32 mmol/L 26 23 20(L)  Calcium 8.9 - 10.3 mg/dL 9.8 8.6(L) 9.0  Total Protein 6.5 - 8.1 g/dL 6.7 - -  Total Bilirubin 0.3 - 1.2 mg/dL 0.4 - -  Alkaline Phos 38 - 126 U/L 80 - -  AST 15 - 41 U/L 36 - -  ALT 0 - 44 U/L 26 - -      RADIOGRAPHIC STUDIES: I have personally reviewed the radiological images as listed and  agreed with the findings in the report. CT Abdomen Pelvis W Contrast  Result Date: 09/23/2019 CLINICAL DATA:  Restaging of rectal cancer diagnosed about 3 months ago. Chemotherapy in progress. EXAM: CT ABDOMEN AND PELVIS WITH CONTRAST TECHNIQUE: Multidetector CT imaging of the abdomen and pelvis was performed using the standard protocol following bolus administration of intravenous contrast. CONTRAST:  178m OMNIPAQUE IOHEXOL 300 MG/ML  SOLN COMPARISON:  PET-CT 07/13/2019 FINDINGS: Lower chest: Stable scarring in the posterior basal segments of both lower lobes. Hepatobiliary: Multiple large gallstones filling the gallbladder. Nitrogen gas phenomenon within the stones. No hepatic mass identified. No significant focal hepatic lesion is observed. No biliary dilatation. Pancreas: Unremarkable Spleen: Unremarkable Adrenals/Urinary Tract: Complex bilateral renal lesions are similar to prior exams. The hyperdense small right kidney upper pole and larger left mid kidney lesions are stable and probably hemorrhagic cysts. There is a 4 mm left kidney lower pole nonobstructive renal calculus, and a septated cystic lesion anteriorly in the right mid kidney both which appear stable. Stomach/Bowel: Solid-appearing left eccentric upper rectal mass measures about 6.7 by 2.9 cm on image 58/2. This is difficult to compare directly to the prior exam given differences in orientation and degree of distension of the rectum. There is some prominence of stool in the sigmoid colon and rectum on today's examination, but the mass is not obstructive. Vascular/Lymphatic: Aortoiliac atherosclerotic vascular disease. Reproductive: Mild prostatomegaly with nodular prominence the upper margin of the prostate gland. Other: No supplemental non-categorized findings. Musculoskeletal: Bridging spurring of the left sacroiliac joint. Grade 1 degenerative anterolisthesis of L5 on S1. Lumbar spondylosis and degenerative disc disease causing multilevel  foraminal impingement. IMPRESSION: 1. Solid-appearing left eccentric upper rectal mass measures about 6.7 by 2.9 cm. This is difficult to compare directly to the prior exam given differences in orientation and degree of distension of the rectum. There is some prominence of stool in the sigmoid colon and rectum on today's examination, but the mass is not obstructive. 2. Other imaging findings of potential clinical significance: Cholelithiasis. Complex bilateral renal lesions are similar to prior exams. Nonobstructive left nephrolithiasis. Mild prostatomegaly with nodular prominence the upper margin of the prostate gland. Lumbar spondylosis and degenerative disc disease causing multilevel foraminal impingement. 3. Aortic atherosclerosis. Aortic Atherosclerosis (ICD10-I70.0). Electronically Signed   By: WVan ClinesM.D.   On: 09/23/2019 15:49     ASSESSMENT & PLAN: 74yo male with   1. Adenocarcinoma of the rectum, cT4N1M0 stage III -He was found to have proximal rectal mass on first screening colonoscopy on 05/29/19 by Dr. KTherisa Doyne with associated frequent stool and 10 lbs weight loss.  -Work up showedsmallindeterminate pulmonary nodules, no distant metastasis. This was locally staged T4N1 on MRI. There was omental haziness noted on CT, which was not obvious on MRI and negative on PET from 07/13/19.  -His case was discussed in our GI tumor conference and the consensus is to proceed with total neoadjuvant chemotherapy before surgical resection for his locally  advanced disease. Goal of neoadjuvant chemo is curative. He has met with Dr. Lisbeth Renshaw.  -Baseline CEA normal 2.24 -enrolled in exact science study  -He began the initial course of chemotherapy with FOLFOX on 07/02/19. Tolerated first cycle very well. The plan is to complete 4 months of FOLFOX, then ccRT with sensitizing capecitabine for 5-6 weeks, then eventual surgery.  -S/p 6 cycles of FOLFOX, he tolerated well until he developed altered mental  status change the day after C6, he was admitted and found to have had a seizure. Infectious cause ruled out. MRI negative for brain metastasis  -we reviewed the possibility this could be from oxaliplatin-related neurotoxicity. He appears to have completely recovered to baseline. Tolerating keppra now  -we reviewed his restaging CT AP 09/23/19 which measures the rectal mass slightly enlarged from April CT and June PET, but direct comparison is difficult. The patient was seen with Dr. Burr Medico today who reviewed the report.   -we plan to review his case in tumor board next week. If the consensus is tumor growth, then he is not responding well to FOLFOX and we will move to radiation. Will call him next week after discussion -Ms. Drapeau has mild rectal pain today but otherwise appears stable. He will be given tylenol 650 mg which he can continue PRN at home. if not enough he will call us.  -CBC and CMP are adequate for treatment. Will proceed with cycle 7 FOLFOX with dose-reduced oxaliplatin  2. Acute AMS, seizure 09/11/19 -Developed acute altered mental status day after Cycle 6 chemo while 5FU pump infusing, he pulled the needle out and pump was stopped.  -he was admitted for work up, EEG showed epileptic spike. ID work up negative, MRI negative or metastasis  -? oxali neurotoxicity  -further neuro work up is pending, followed by Dr. Delice Lesch   3. Mild cognitive impairment -followed by [facility] -He is a reliable historian, independent with ADLs, a/o x4,   PLAN: -Hospital course reviewed -Labs reviewed -CT AP reviewed, discuss in tumor board  -Proceed with cycle 7 FOLFOX today, oxali will be dose-reduced -Call patient next week after tumor board  -Tylenol 650 mg po x1 in clinic for rectal pain   All questions were answered. The patient knows to call the clinic with any problems, questions or concerns. No barriers to learning was detected.     Alla Feeling, NP 09/25/19   Addendum  I have  seen the patient, examined him. I agree with the assessment and and plan and have edited the notes.   Carlos [name] recovered very well from his recent hospitalization for AMS, which thought to be related to seizure. I have some concern this could be related to oxaliplatin neurotoxicity.  I reviewed his restaging CT scan in person, his rectal tumor seems to be slightly larger (the biggest dimention)  than before but I am not sure about the total volume has increased also. Plan to review it in next GI conference.  If the consensus is cancer progression, plan to move to radiation sooner.  lab reviewed, will proceed chemo today with oxaliplatin dose reduction. Pt voiced good understanding, and agrees with the plan.  Truitt Merle  09/26/2019

## 2019-09-24 NOTE — Progress Notes (Signed)
Nutrition follow-up completed with patient during infusion for colorectal cancer. Weight is stable at 133.4 pounds on August 19. Patient denies nausea, vomiting, constipation and diarrhea. Patient reports his appetite is good. He has many questions about why he had a seizure and wants to know if it is food related.  Nutrition diagnosis: Food and nutrition related knowledge deficit improved.  Intervention: Encourage patient to continue strategies for weight maintenance maintenance including adequate calories and protein. Encouraged patient to discuss his questions about the cause of his seizure with his MD.  Monitoring, Evaluation, Goals: Patient will tolerate adequate calories and protein for weight maintenance.  Next Visit: To be scheduled as needed.  **Disclaimer: This note was dictated with voice recognition software. Similar sounding words can inadvertently be transcribed and this note may contain transcription errors which may not have been corrected upon publication of note.**

## 2019-09-24 NOTE — Patient Instructions (Signed)
[facility] Discharge Instructions for Patients Receiving Chemotherapy  Today you received the following chemotherapy agents: oxaliplatin, leucovorin, and fluorouracil.  To help prevent nausea and vomiting after your treatment, we encourage you to take your nausea medication as directed.   If you develop nausea and vomiting that is not controlled by your nausea medication, call the clinic.   BELOW ARE SYMPTOMS THAT SHOULD BE REPORTED IMMEDIATELY:  *FEVER GREATER THAN 100.5 F  *CHILLS WITH OR WITHOUT FEVER  NAUSEA AND VOMITING THAT IS NOT CONTROLLED WITH YOUR NAUSEA MEDICATION  *UNUSUAL SHORTNESS OF BREATH  *UNUSUAL BRUISING OR BLEEDING  TENDERNESS IN MOUTH AND THROAT WITH OR WITHOUT PRESENCE OF ULCERS  *URINARY PROBLEMS  *BOWEL PROBLEMS  UNUSUAL RASH Items with * indicate a potential emergency and should be followed up as soon as possible.  Feel free to call the clinic should you have any questions or concerns. The clinic phone number is [phone].  Please show the CHEMO ALERT CARD at check-in to the Emergency Department and triage nurse.   

## 2019-09-25 ENCOUNTER — Telehealth: Payer: Self-pay | Admitting: Neurology

## 2019-09-25 ENCOUNTER — Encounter: Payer: Self-pay | Admitting: Nurse Practitioner

## 2019-09-25 NOTE — Telephone Encounter (Signed)
Called patients sister [name] and left a message for a call back.

## 2019-09-25 NOTE — Telephone Encounter (Signed)
[name] returned call and was informed of Dr. Amparo Bristol recommendation. [name] verbalized understanding.

## 2019-09-25 NOTE — Telephone Encounter (Signed)
Patient's sister called in and wants to find out if the patient is stable enough for him to stay by himself with his 74 year old mother without someone staying the night with him? He has been going to work most days and has been able to stay.

## 2019-09-25 NOTE — Telephone Encounter (Signed)
I think it should be okay, but certainly if things change, this will need to be re-evaluated. Thanks

## 2019-09-26 ENCOUNTER — Other Ambulatory Visit: Payer: Self-pay

## 2019-09-26 ENCOUNTER — Inpatient Hospital Stay: Payer: 59

## 2019-09-26 VITALS — BP 122/72 | HR 79 | Temp 100.3°F | Resp 18

## 2019-09-26 DIAGNOSIS — Z5111 Encounter for antineoplastic chemotherapy: Secondary | ICD-10-CM | POA: Diagnosis not present

## 2019-09-26 DIAGNOSIS — C20 Malignant neoplasm of rectum: Secondary | ICD-10-CM

## 2019-09-26 MED ORDER — SODIUM CHLORIDE 0.9% FLUSH
10.0000 mL | INTRAVENOUS | Status: DC | PRN
  Administered 2019-09-26: 10 mL
  Filled 2019-09-26: qty 10

## 2019-09-26 MED ORDER — HEPARIN SOD (PORK) LOCK FLUSH 100 UNIT/ML IV SOLN
500.0000 [IU] | Freq: Once | INTRAVENOUS | Status: AC | PRN
  Administered 2019-09-26: 500 [IU]
  Filled 2019-09-26: qty 5

## 2019-09-26 NOTE — Patient Instructions (Signed)

## 2019-09-28 ENCOUNTER — Telehealth: Payer: Self-pay

## 2019-09-28 ENCOUNTER — Other Ambulatory Visit: Payer: Self-pay | Admitting: Hematology

## 2019-09-28 MED ORDER — TRAMADOL HCL 50 MG PO TABS
50.0000 mg | ORAL_TABLET | Freq: Four times a day (QID) | ORAL | 0 refills | Status: DC | PRN
Start: 2019-09-28 — End: 2019-11-23

## 2019-09-28 NOTE — Telephone Encounter (Signed)
Carlos Callow' sister [name] called.  Requesting appt for covid booster for Carlos [name],  This has been scheduled.  She also states he has been complaining of increased rectal pain that is not controled by tylenol.  She states he took a tramadol left over from port placement and that worked.  She is requesting a prescription for tramadol.  Forwarded to Dr Burr Medico

## 2019-09-29 ENCOUNTER — Ambulatory Visit: Payer: 59 | Admitting: Speech Pathology

## 2019-09-29 ENCOUNTER — Other Ambulatory Visit: Payer: Self-pay

## 2019-09-29 ENCOUNTER — Inpatient Hospital Stay: Payer: 59

## 2019-09-29 ENCOUNTER — Telehealth: Payer: Self-pay

## 2019-09-29 NOTE — Telephone Encounter (Signed)
Carlos Carlos [name]' sister Carlos [name] called stating Carlos [name] is having rectal pain.  I reviewed with her that Dr. Burr Medico sent a prescription for tramadol to his pharmacy yesterday.  She states that he has been taking tramadol 2 times a day for the past couple of days, this tramadol was left over from surgery. She states the 2 tablets per day reduces the pain but does not take it away.  I told her that we can not always make all of the pain go away but reach an acceptable level for Carlos [name].  I instructed her to have him take tramadol every 6 hours as needed for pain as this is how Dr Burr Medico has prescribed. She stated they had not gotten his prescription from the pharmacy. She also requested a rectal suppository to help with the pain.  I explained the pain is coming from the rectal mass and Dr. Burr Medico does not usually prescribe a suppository medication for this pain.  I told her I would review with Dr. Burr Medico tomorrow. She verbalized understanding

## 2019-09-30 ENCOUNTER — Inpatient Hospital Stay: Payer: 59

## 2019-09-30 ENCOUNTER — Other Ambulatory Visit: Payer: Self-pay

## 2019-09-30 DIAGNOSIS — Z23 Encounter for immunization: Secondary | ICD-10-CM

## 2019-09-30 NOTE — Progress Notes (Signed)
   Covid-19 Vaccination Clinic  Name:  Carlos [name]    MRN: [id number] DOB: 1945-11-12  09/30/2019  Mr. Marrow was observed post Covid-19 immunization for 15 minutes without incident. He was provided with Vaccine Information Sheet and instruction to access the V-Safe system.   [name] was instructed to call 911 with any severe reactions post vaccine: Marland Kitchen Difficulty breathing  . Swelling of face and throat  . A fast heartbeat  . A bad rash all over body  . Dizziness and weakness   Immunizations Administered    Name Date Dose VIS Date Route   Pfizer COVID-19 Vaccine 09/30/2019  9:52 AM 0.3 mL 04/01/2018 Intramuscular   Manufacturer: [place]   Lot: [id number]   [place]: [id number]

## 2019-10-01 ENCOUNTER — Telehealth: Payer: Self-pay | Admitting: Neurology

## 2019-10-01 NOTE — Telephone Encounter (Signed)
Patient's sister called to check on the status of some forms she left with the doctor that are related to transportation needs for the patient. She'd like a call back with a status update.

## 2019-10-02 ENCOUNTER — Telehealth: Payer: Self-pay

## 2019-10-02 NOTE — Telephone Encounter (Signed)
This has been done already

## 2019-10-02 NOTE — Telephone Encounter (Signed)
Ms Carlos [name], [name]' sister called and left a vm asking if he can take tylenol with the tramadol and should he alternate.  I left her a vm stating he can alternate tylenol and tramadol.  I told her he can take tylenol 4 times per day.

## 2019-10-07 NOTE — Progress Notes (Signed)
[facility]   [phone] [phone]   Clinic Follow up Note   Patient Care Team: [name], MD as PCP - General (Family Medicine) Cameron Sprang, MD as Consulting Physician (Neurology) Jonnie Finner, RN as Oncology Nurse Navigator Truitt Merle, MD as Consulting Physician (Hematology) Ronnette Juniper, MD as Consulting Physician (Gastroenterology)  Date of Service:  10/08/2019  CHIEF COMPLAINT:  F/u rectal cancer  SUMMARY OF ONCOLOGIC HISTORY: Oncology History Overview Note  Cancer Staging Rectal adenocarcinoma [facility]) Staging form: Colon and Rectum, AJCC 8th Edition - Clinical stage from 06/12/2019: cT4, cN1 - Unsigned    Rectal adenocarcinoma ([place])  05/29/2019 Procedure   Colonoscopy per Dr. Therisa Doyne A digital rectal exam was normal  frond-like fungating infiltrative non-obstructing large mass in the rectum. The mass was partially circumferential, measured 5 cm in length. The colonoscopy also showed 11 sessile polyps in the transverse and asecnding colon and hepatic flexure.    05/29/2019 Initial Biopsy   Pathology on the rectal biopsy showed at least intramucosal adenocarcinoma involving tubulovillous adenoma with high grade dysplasia.   06/05/2019 Imaging   CT CAP IMPRESSION: 1. Cholelithiasis and gallbladder wall thickening with equivocal pericholecystic inflammation. Acute cholecystitis not excluded. If there is clinical suspicion for acute cholecystitis, recommend ultrasound evaluation. 2. Approximately 5 cm irregular mass within the distal sigmoid colon with possible extension through the wall. No definite abnormal adjacent lymph nodes. 3. Mild omental haziness-nonspecific. Metastatic disease not excluded. 4. Indeterminate 3-4 mm RIGHT pulmonary nodules which could represent metastatic disease. These are too small for PET CT evaluation. Consider CT follow-up. 5. 2.5 cm irregular cystic mass within the RIGHT kidney. Elective MRI  recommended for further evaluation. 6. 4 mm nonobstructing LEFT LOWER pole renal calculus. 7. Coronary artery disease. 8. Aortic Atherosclerosis (ICD10-I70.0).   06/12/2019 Imaging   Staging MRI pelvis  FINDINGS: TUMOR LOCATION Tumor distance from Anal Verge/Skin Surface:  11.4 cm Tumor distance to Internal Anal Sphincter: 6.5 cm TUMOR DESCRIPTION Circumferential Extent: 100% Tumor Length: 8.3 cm T - CATEGORY Extension through Muscularis Propria: Yes>26mm=T3d Shortest Distance of any tumor/node from Mesorectal Fascia: 0 mm, along the left lateral and posterior walls (tumor abuts the left lateral pelvic sidewall and sacrum) Extramural Vascular Invasion/Tumor Thrombus: No Invasion of Anterior Peritoneal Reflection: No Involvement of Adjacent Organs or Pelvic Sidewall: Yes, involving the left lateral pelvic sidewall =T4 Levator Ani Involvement: No N - CATEGORY Mesorectal Lymph Nodes >=12mm: 2=N1 Extra-mesorectal Lymphadenopathy: No Other:  None. IMPRESSION: Rectal adenocarcinoma T stage: T4 Rectal adenocarcinoma N stage:  N1 Distance from tumor to the internal anal sphincter is 6.5 cm.   06/18/2019 Initial Diagnosis   Rectal adenocarcinoma ([place])   07/02/2019 -  Chemotherapy   Total Neoadjuvant FOLFOX q2weeks starting 07/02/19 for 4 months, followed by CC-ChemoRT   07/13/2019 PET scan   IMPRESSION: 1. Known rectal mass is intensely hypermetabolic compatible with primary rectal adenocarcinoma. No findings of FDG avid nodal metastasis or distant metastatic disease. 2. Hyperdense kidney cysts are identified bilaterally favored to represent hemorrhagic cyst. 3. Nonobstructing left renal calculi. 4. Aortic atherosclerosis, in addition to lad coronary artery disease. Please note that although the presence of coronary artery calcium documents the presence of coronary artery disease, the severity of this disease and any potential stenosis cannot be assessed on this non-gated CT  examination. Assessment for potential risk factor modification, dietary therapy or pharmacologic therapy may be warranted, if clinically indicated. Aortic Atherosclerosis (ICD10-I70.0). 5. Gallstones.   09/11/2019 - 09/14/2019 Hospital  Admission   Acute AMS, seizure 09/11/19 -Developed acute altered mental status day after Cycle 6 chemo (09/11/19).  -He was admitted for work up, EEG showed epileptic spike. ID work up negative, MRI negative for metastasis.    09/11/2019 Imaging   MRI Brain  IMPRESSION: Incomplete study. Images obtained reveal no acute abnormality. Negative for acute infarct.   Atrophy and mild ventricular enlargement stable from prior studies.   09/11/2019 Imaging   CT head  IMPRESSION: Mild ventricular prominence with normal appearing sulci. Question early communicating hydrocephalus. Brain parenchyma appears unremarkable. No acute infarct. No mass or hemorrhage.   Foci of arterial vascular calcification noted. There is mucosal thickening in several ethmoid air cells.   There is probable cerumen in the right external auditory canal.     09/23/2019 Imaging   CT AP  IMPRESSION: 1. Solid-appearing left eccentric upper rectal mass measures about 6.7 by 2.9 cm. This is difficult to compare directly to the prior exam given differences in orientation and degree of distension of the rectum. There is some prominence of stool in the sigmoid colon and rectum on today's examination, but the mass is not obstructive. 2. Other imaging findings of potential clinical significance: Cholelithiasis. Complex bilateral renal lesions are similar to prior exams. Nonobstructive left nephrolithiasis. Mild prostatomegaly with nodular prominence the upper margin of the prostate gland. Lumbar spondylosis and degenerative disc disease causing multilevel foraminal impingement. 3. Aortic atherosclerosis.   Aortic Atherosclerosis (ICD10-I70.0).      CURRENT THERAPY:  Total Neoadjuvant FOLFOX  q2weeks starting 07/02/19 for 4 months, followed by CC-ChemoRT  INTERVAL HISTORY:  Carlos [name] is here for a follow up and treatment. He presents to the clinic with his sister.   He has been more fatigued lately, and sleeps more during day after work (he still works 40 hr a day), per his sister. He also reports rectal pain, he is taking tramadol once a day but his pain is not well controlled. BM normal, no diarrhea or bleeding. No fever, chills or other new complains, no significant weight loss since last visit.  ROS otherwise negative.   MEDICAL HISTORY:  Past Medical History:  Diagnosis Date   Anxiety    Benign prostatic hyperplasia 03/30/2013   10/1 IMO update   Cancer ([place])    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/23/2019    SURGICAL HISTORY: Past Surgical History:  Procedure Laterality Date   PORTACATH PLACEMENT N/A 07/01/2019   Procedure: PORT ULTRASOUND GUIDED PLACEMENT;  Surgeon: Alphonsa Overall, MD;  Location: [facility];  Service: General;  Laterality: N/A;   PROSTATE SURGERY  2004   TONSILECTOMY, ADENOIDECTOMY, BILATERAL MYRINGOTOMY AND TUBES      I have reviewed the social history and family history with the patient and they are unchanged from previous note.  ALLERGIES:  has No Known Allergies.  MEDICATIONS:  Current Outpatient Medications  Medication Sig Dispense Refill   levETIRAcetam (KEPPRA) 1000 MG tablet Take 1 tablet (1,000 mg total) by mouth 2 (two) times daily. 60 tablet 11   traMADol (ULTRAM) 50 MG tablet Take 1 tablet (50 mg total) by mouth every 6 (six) hours as needed for moderate pain. 30 tablet 0   No current facility-administered medications for this visit.    PHYSICAL EXAMINATION: ECOG PERFORMANCE STATUS: 1 - Symptomatic but completely ambulatory  Vitals:   10/08/19 0900  BP: (!) 149/88  Pulse: 95  Resp: 18  Temp: (!) 95.7 F (35.4 C)  SpO2: 100%   Filed Weights  10/08/19 0900  Weight: 133 lb 8 oz (60.6 kg)     GENERAL:alert, no distress and comfortable SKIN: skin color, texture, turgor are normal, no rashes or significant lesions EYES: normal, Conjunctiva are pink and non-injected, sclera clear Musculoskeletal:no cyanosis of digits and no clubbing  NEURO: alert & oriented x 3 with fluent speech, no focal motor/sensory deficits  LABORATORY DATA:  I have reviewed the data as listed CBC Latest Ref Rng & Units 10/08/2019 09/24/2019 09/14/2019  WBC 4.0 - 10.5 K/uL 5.2 4.8 5.9  Hemoglobin 13.0 - 17.0 g/dL 12.1(L) 11.9(L) 11.9(L)  Hematocrit 39 - 52 % 36.1(L) 35.5(L) 36.4(L)  Platelets 150 - 400 K/uL 183 176 123(L)     CMP Latest Ref Rng & Units 09/24/2019 09/14/2019 09/13/2019  Glucose 70 - 99 mg/dL 123(H) 97 124(H)  BUN 8 - 23 mg/dL 6(L) 14 11  Creatinine 0.61 - 1.24 mg/dL 0.84 0.75 0.82  Sodium 135 - 145 mmol/L 141 138 139  Potassium 3.5 - 5.1 mmol/L 3.9 4.3 3.6  Chloride 98 - 111 mmol/L 105 108 108  CO2 22 - 32 mmol/L 26 23 20(L)  Calcium 8.9 - 10.3 mg/dL 9.8 8.6(L) 9.0  Total Protein 6.5 - 8.1 g/dL 6.7 - -  Total Bilirubin 0.3 - 1.2 mg/dL 0.4 - -  Alkaline Phos 38 - 126 U/L 80 - -  AST 15 - 41 U/L 36 - -  ALT 0 - 44 U/L 26 - -      RADIOGRAPHIC STUDIES: I have personally reviewed the radiological images as listed and agreed with the findings in the report. No results found.   ASSESSMENT & PLAN:  PARAG DORTON is a 74 y.o. male with    1. Adenocarcinoma of the rectum, cT4N1M0 stage III -He was recently diagnosed in 05/2019 with locally advanced stage III rectal cancer.  -I started him on standard total neoadjuvant chemotherapy, including 4 months of FOLFOX q2weeks starting 07/02/19, followed byconcurrent chemoRT with Xeloda for 5-6 weeks prior to proceeding with surgery with Dr Marcello Moores. The treatment goal is curative. -S/p 6 cycles of FOLFOX, he tolerated well until he developed altered mental status change the day after C6, he was admitted and found to have had a seizure. He was  able to recover back to baseline.  -His restaging CT AP 09/23/19 measures the rectal mass slightly enlarged from April CT and June PET. I discussed his case and scan in GI Tumor Board, and we felt the tumor volume has decreased since he started chemo.  No new lesions. -Lab reviewed, adequate for treatment, plan to proceed last cycle chemotherapy FOLFOX today, with dose reduction due to his worsening fatigue per his sister. -We will refer him back to radiation oncology for concurrent chemoradiation, starting in about 3 weeks. -I will call in Xeloda today, start with his first dose of radiation.  I reviewed the potential side effect with patient and his sister in details, he agrees to proceed. -Follow-up in 2 to 3 weeks    2. Rectal Pain, secondary to #1 -onset in mid August.  -Currently on Tramadol q6hours for pain control.  He takes about once a day, pain not very well controlled, I encouraged him to increase tramadol as needed, okay to use Tylenol also.   3. Acute AMS, seizure 09/11/19 -Developed acute altered mental status day after Cycle 6 chemo (09/11/19).  -He was admitted for work up, EEG showed epileptic spike. ID work up negative, MRI negative for metastasis.  -Followed by Dr.  Aquino   4. Mild cognitive impairment, Social support  -followed by [employer] [facility] -He is a reliable historian, independent with ADLs, a/o x4, no signs of obvious impairment -Will monitor for neurotoxicities on chemo, especially oxaliplatin  -He has very good family support from his 2 sisters.  -He currently has been able to go to work everyday since being on chemo. -Pt's sister is very concerned about his long working hour (pt would like to increase).  After lengthy discussion, patient agreed to stay on 40 hours a week.    PLAN: -Labs reviewed and adequate to proceed with [id number] todayat further dose reduction due to worsening fatigue -refer back to Dr. Lisbeth Renshaw -I will call in Xeloda today, start with  concurrent radiation -f/u in 2-3 weeks    No problem-specific Assessment & Plan notes found for this encounter.   No orders of the defined types were placed in this encounter.  Pt's sister had many questions, all were answered to her satisfaction. The patient knows to call the clinic with any problems, questions or concerns. No barriers to learning was detected. The total time spent in the appointment was 40 minutes.     Truitt Merle, MD 10/08/2019   I, Joslyn Devon, am acting as scribe for Truitt Merle, MD.   I have reviewed the above documentation for accuracy and completeness, and I agree with the above.

## 2019-10-08 ENCOUNTER — Inpatient Hospital Stay: Payer: 59

## 2019-10-08 ENCOUNTER — Other Ambulatory Visit: Payer: Self-pay

## 2019-10-08 ENCOUNTER — Telehealth: Payer: Self-pay | Admitting: Pharmacist

## 2019-10-08 ENCOUNTER — Encounter: Payer: Self-pay | Admitting: Hematology

## 2019-10-08 ENCOUNTER — Inpatient Hospital Stay: Payer: 59 | Admitting: Hematology

## 2019-10-08 ENCOUNTER — Telehealth: Payer: Self-pay

## 2019-10-08 VITALS — BP 149/88 | HR 95 | Temp 95.7°F | Resp 18 | Ht 67.0 in | Wt 133.5 lb

## 2019-10-08 VITALS — Temp 97.9°F

## 2019-10-08 DIAGNOSIS — G3184 Mild cognitive impairment, so stated: Secondary | ICD-10-CM | POA: Insufficient documentation

## 2019-10-08 DIAGNOSIS — Z510 Encounter for antineoplastic radiation therapy: Secondary | ICD-10-CM | POA: Diagnosis not present

## 2019-10-08 DIAGNOSIS — I700 Atherosclerosis of aorta: Secondary | ICD-10-CM | POA: Insufficient documentation

## 2019-10-08 DIAGNOSIS — C20 Malignant neoplasm of rectum: Secondary | ICD-10-CM

## 2019-10-08 DIAGNOSIS — G893 Neoplasm related pain (acute) (chronic): Secondary | ICD-10-CM | POA: Insufficient documentation

## 2019-10-08 DIAGNOSIS — N400 Enlarged prostate without lower urinary tract symptoms: Secondary | ICD-10-CM | POA: Insufficient documentation

## 2019-10-08 DIAGNOSIS — Z5111 Encounter for antineoplastic chemotherapy: Secondary | ICD-10-CM | POA: Insufficient documentation

## 2019-10-08 DIAGNOSIS — Z95828 Presence of other vascular implants and grafts: Secondary | ICD-10-CM

## 2019-10-08 DIAGNOSIS — I2510 Atherosclerotic heart disease of native coronary artery without angina pectoris: Secondary | ICD-10-CM | POA: Insufficient documentation

## 2019-10-08 LAB — CMP (CANCER CENTER ONLY)
ALT: 18 U/L (ref 0–44)
AST: 27 U/L (ref 15–41)
Albumin: 3.8 g/dL (ref 3.5–5.0)
Alkaline Phosphatase: 86 U/L (ref 38–126)
Anion gap: 9 (ref 5–15)
BUN: 11 mg/dL (ref 8–23)
CO2: 27 mmol/L (ref 22–32)
Calcium: 10.1 mg/dL (ref 8.9–10.3)
Chloride: 105 mmol/L (ref 98–111)
Creatinine: 0.8 mg/dL (ref 0.61–1.24)
GFR, Est AFR Am: >60 mL/min (ref >60)
GFR, Estimated: >60 mL/min (ref >60)
Glucose, Bld: 136 mg/dL — ABNORMAL HIGH (ref 70–99)
Potassium: 3.7 mmol/L (ref 3.5–5.1)
Sodium: 141 mmol/L (ref 135–145)
Total Bilirubin: 0.3 mg/dL (ref 0.3–1.2)
Total Protein: 7.1 g/dL (ref 6.5–8.1)

## 2019-10-08 LAB — CBC WITH DIFFERENTIAL (CANCER CENTER ONLY)
Abs Immature Granulocytes: 0.03 10*3/uL (ref 0.00–0.07)
Basophils Absolute: 0 10*3/uL (ref 0.0–0.1)
Basophils Relative: 0 %
Eosinophils Absolute: 0.2 10*3/uL (ref 0.0–0.5)
Eosinophils Relative: 3 %
HCT: 36.1 % — ABNORMAL LOW (ref 39.0–52.0)
Hemoglobin: 12.1 g/dL — ABNORMAL LOW (ref 13.0–17.0)
Immature Granulocytes: 1 %
Lymphocytes Relative: 51 %
Lymphs Abs: 2.7 10*3/uL (ref 0.7–4.0)
MCH: 29.6 pg (ref 26.0–34.0)
MCHC: 33.5 g/dL (ref 30.0–36.0)
MCV: 88.3 fL (ref 80.0–100.0)
Monocytes Absolute: 0.7 10*3/uL (ref 0.1–1.0)
Monocytes Relative: 14 %
Neutro Abs: 1.6 10*3/uL — ABNORMAL LOW (ref 1.7–7.7)
Neutrophils Relative %: 31 %
Platelet Count: 183 10*3/uL (ref 150–400)
RBC: 4.09 MIL/uL — ABNORMAL LOW (ref 4.22–5.81)
RDW: 14.7 % (ref 11.5–15.5)
WBC Count: 5.2 10*3/uL (ref 4.0–10.5)
nRBC: 0 % (ref 0.0–0.2)

## 2019-10-08 MED ORDER — SODIUM CHLORIDE 0.9% FLUSH
10.0000 mL | INTRAVENOUS | Status: DC | PRN
  Administered 2019-10-08: 10 mL via INTRAVENOUS
  Filled 2019-10-08: qty 10

## 2019-10-08 MED ORDER — SODIUM CHLORIDE 0.9 % IV SOLN
10.0000 mg | Freq: Once | INTRAVENOUS | Status: AC
  Administered 2019-10-08: 10 mg via INTRAVENOUS
  Filled 2019-10-08: qty 10

## 2019-10-08 MED ORDER — DEXTROSE 5 % IV SOLN
Freq: Once | INTRAVENOUS | Status: AC
  Filled 2019-10-08: qty 250

## 2019-10-08 MED ORDER — SODIUM CHLORIDE 0.9 % IV SOLN
2200.0000 mg/m2 | INTRAVENOUS | Status: DC
  Administered 2019-10-08: 3700 mg via INTRAVENOUS
  Filled 2019-10-08: qty 74

## 2019-10-08 MED ORDER — CAPECITABINE 500 MG PO TABS: 825.0000 mg/m2 | ORAL_TABLET | Freq: Two times a day (BID) | ORAL | 1 refills | Status: DC

## 2019-10-08 MED ORDER — LEUCOVORIN CALCIUM INJECTION 350 MG
400.0000 mg/m2 | Freq: Once | INTRAVENOUS | Status: AC
  Administered 2019-10-08: 672 mg via INTRAVENOUS
  Filled 2019-10-08: qty 33.6

## 2019-10-08 MED ORDER — PALONOSETRON HCL INJECTION 0.25 MG/5ML
0.2500 mg | Freq: Once | INTRAVENOUS | Status: AC
  Administered 2019-10-08: 0.25 mg via INTRAVENOUS

## 2019-10-08 MED ORDER — OXALIPLATIN CHEMO INJECTION 100 MG/20ML
60.0000 mg/m2 | Freq: Once | INTRAVENOUS | Status: AC
  Administered 2019-10-08: 100 mg via INTRAVENOUS
  Filled 2019-10-08: qty 20

## 2019-10-08 MED ORDER — PALONOSETRON HCL INJECTION 0.25 MG/5ML
INTRAVENOUS | Status: AC
  Filled 2019-10-08: qty 5

## 2019-10-08 NOTE — Addendum Note (Signed)
Addended by: Truitt Merle on: 10/08/2019 11:47 AM   Modules accepted: Orders

## 2019-10-08 NOTE — Telephone Encounter (Signed)
Oral Oncology Pharmacist Encounter  Received new prescription for Xeloda (capecitabine) for the treatment of locally advanced stage III rectal cancer in conjunction with radiation, planned for duration of radiation.  Prescription dose and frequency assessed for appropriateness. Appropriate for therapy initiation.   CBC w/ Diff and CMP from 10/08/2019 assessed, noted ANC 1.6 K/uL - all other labs stable for treatment initiation.  Current medication list in Epic reviewed, no significant/relevant DDIs with Xeloda identified.  Evaluated chart and no patient barriers to medication adherence noted.   Prescription has been e-scribed to [facility] due to insurance requirements.  Oral Oncology Clinic will continue to follow for insurance authorization, copayment issues, initial counseling and start date.  Leron Croak, PharmD, BCPS Hematology/Oncology Clinical Pharmacist [facility] [phone] 10/08/2019 1:19 PM

## 2019-10-08 NOTE — Telephone Encounter (Signed)
Oral Oncology Patient Advocate Encounter  Received notification from [place] that prior authorization for Xeloda is required.  PA submitted on CoverMyMeds Key [id number] Status is pending  Oral Oncology Clinic will continue to follow.  [place] Patient [place] Phone [phone] Fax [phone] 10/08/2019 1:16 PM

## 2019-10-08 NOTE — Patient Instructions (Signed)
[facility] Discharge Instructions for Patients Receiving Chemotherapy  Today you received the following chemotherapy agents: oxaliplatin, leucovorin, and fluorouracil.  To help prevent nausea and vomiting after your treatment, we encourage you to take your nausea medication as directed.   If you develop nausea and vomiting that is not controlled by your nausea medication, call the clinic.   BELOW ARE SYMPTOMS THAT SHOULD BE REPORTED IMMEDIATELY:  *FEVER GREATER THAN 100.5 F  *CHILLS WITH OR WITHOUT FEVER  NAUSEA AND VOMITING THAT IS NOT CONTROLLED WITH YOUR NAUSEA MEDICATION  *UNUSUAL SHORTNESS OF BREATH  *UNUSUAL BRUISING OR BLEEDING  TENDERNESS IN MOUTH AND THROAT WITH OR WITHOUT PRESENCE OF ULCERS  *URINARY PROBLEMS  *BOWEL PROBLEMS  UNUSUAL RASH Items with * indicate a potential emergency and should be followed up as soon as possible.  Feel free to call the clinic should you have any questions or concerns. The clinic phone number is [phone].  Please show the CHEMO ALERT CARD at check-in to the Emergency Department and triage nurse.   

## 2019-10-09 ENCOUNTER — Other Ambulatory Visit: Payer: Self-pay | Admitting: Hematology

## 2019-10-09 ENCOUNTER — Telehealth: Payer: Self-pay

## 2019-10-09 ENCOUNTER — Telehealth: Payer: Self-pay | Admitting: Hematology

## 2019-10-09 MED ORDER — HYDROCODONE-ACETAMINOPHEN 5-300 MG PO TABS
1.0000 | ORAL_TABLET | Freq: Three times a day (TID) | ORAL | 0 refills | Status: DC | PRN
Start: 2019-10-09 — End: 2019-12-15

## 2019-10-09 NOTE — Telephone Encounter (Signed)
Pt's sister called and requested strongly pain medication for his rectal pain. I called pt, his rectal pain occasionally gets bad and tramadol is not enough sometime. I called in hydrocodone for him for severe pain, he is still ok to use tramadol as needed. He voiced good understanding and will pick up tomorrow morning.   Truitt Merle  10/09/2019

## 2019-10-09 NOTE — Telephone Encounter (Signed)
Carlos [name], Mr [place]' sister left vm requesting different pain medication.  She stated the tramadol is not helping his pain.  I called both Gonzella Lex and Mr Kotch for more information but neither answered the phone. Forwarded to Dr. Burr Medico

## 2019-10-09 NOTE — Telephone Encounter (Signed)
Scheduled appts per 9/2 los. Pt's sister confirmed appt date and time.

## 2019-10-09 NOTE — Telephone Encounter (Signed)
Oral Oncology Patient Advocate Encounter  Prior Authorization for Xeloda  has been approved.    PA# [id number] Effective dates: 10/08/19 through 10/07/20  Patient must fill with [facility] will continue to follow.   [place] Patient [place] Phone [phone] Fax [phone] 10/09/2019 10:55 AM

## 2019-10-10 ENCOUNTER — Inpatient Hospital Stay: Payer: 59

## 2019-10-10 ENCOUNTER — Other Ambulatory Visit: Payer: Self-pay

## 2019-10-10 VITALS — BP 126/76 | HR 74 | Temp 96.1°F | Resp 19

## 2019-10-10 DIAGNOSIS — Z510 Encounter for antineoplastic radiation therapy: Secondary | ICD-10-CM | POA: Diagnosis not present

## 2019-10-10 MED ORDER — HEPARIN SOD (PORK) LOCK FLUSH 100 UNIT/ML IV SOLN
500.0000 [IU] | Freq: Once | INTRAVENOUS | Status: AC | PRN
  Administered 2019-10-10: 500 [IU]
  Filled 2019-10-10: qty 5

## 2019-10-10 MED ORDER — SODIUM CHLORIDE 0.9% FLUSH
10.0000 mL | INTRAVENOUS | Status: DC | PRN
  Administered 2019-10-10: 10 mL
  Filled 2019-10-10: qty 10

## 2019-10-13 ENCOUNTER — Ambulatory Visit: Payer: 59 | Attending: Neurology | Admitting: Speech Pathology

## 2019-10-13 ENCOUNTER — Other Ambulatory Visit: Payer: Self-pay

## 2019-10-13 DIAGNOSIS — R41841 Cognitive communication deficit: Secondary | ICD-10-CM | POA: Diagnosis present

## 2019-10-13 NOTE — Therapy (Signed)
[facility] [address] [place] [employer] [place], [place], [address] Phone: [phone]   Fax:  [phone]  Speech Language Pathology Treatment  Patient Details  Name: Carlos [name] MRN: [id number] Date of Birth: 08/18/1945 Referring Provider (SLP): Dr. Delice Lesch   Encounter Date: 10/13/2019   End of Session - 10/13/19 1229    Visit Number 3    Number of Visits 5    Date for SLP Re-Evaluation 12/12/19    SLP Start Time 1103    SLP Stop Time  1145    SLP Time Calculation (min) 42 min    Activity Tolerance Patient tolerated treatment well           Past Medical History:  Diagnosis Date  . Anxiety   . Benign prostatic hyperplasia 03/30/2013   10/1 IMO update  . Cancer ([place])   . 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/23/2019    Past Surgical History:  Procedure Laterality Date  . PORTACATH PLACEMENT N/A 07/01/2019   Procedure: PORT ULTRASOUND GUIDED PLACEMENT;  Surgeon: Alphonsa Overall, MD;  Location: [facility];  Service: General;  Laterality: N/A;  . PROSTATE SURGERY  2004  . TONSILECTOMY, ADENOIDECTOMY, BILATERAL MYRINGOTOMY AND TUBES      There were no vitals filed for this visit.   Subjective Assessment - 10/13/19 1211    Subjective Pt was hospitalized 8/6-09/14/19    Patient is accompained by: Family member   sister [name]   Currently in Pain? No/denies                 ADULT SLP TREATMENT - 10/13/19 1212      General Information   Behavior/Cognition Alert;Distractible      Treatment Provided   Treatment provided Cognitive-Linquistic      Pain Assessment   Pain Assessment No/denies pain      Cognitive-Linquistic Treatment   Treatment focused on Cognition;Patient/family/caregiver education    Skilled Treatment Pt was hospitalized ~1 month ago due to encephalopathy, EEG concerning for epileptic spike and pt now taking Keppra. He has resumed working 5 days per week. Caregivers for his mother are  currently reminding him to take his medication twice a day; [name] reports pt is also writing it down each time he takes it. SLP educated that greater supervision may be needed, suggested automated pill planner and reminder calls from sister. Pt distractible and did not make eye contact for most of session. He asked SLP same questions x4 without awareness (why he had a seizure, and something about military service). Required frequent redirection to topic. SLP worked with pt and sister to ID compensations, routines and structure that may be helpful for pt. 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 See pt instructions for strategies discussed with pt and sister. Assisted in creating laundry checklist for pt as guide for additional checklists. Encouraged pt and sister to focus at least initially on a limited number of tasks or priorities before adding in additional tasks for pt to complete. Did not bring calendar today so SLP unable to assess whether pt is using this.      Assessment / Recommendations / Plan   Plan Continue with current plan of care      Progression Toward Goals   Progression toward goals Progressing toward goals            SLP Education - 10/13/19 1229    Education Details start with simple priorites  vs attempting to fill pt's day with scheduled activities    Person(s) Educated Patient   sister [name]   Methods Explanation;Handout    Comprehension Verbalized understanding;Need further instruction              SLP Long Term Goals - 10/13/19 1235      SLP LONG TERM GOAL #1   Title Patient will demonstrate carryover of memory compensation system x 2 visits.    Time 4   or 5 total sessions   Period Weeks    Status On-going      SLP LONG TERM GOAL #2   Title Patient will demonstrate knowledge of appropriate cognitive activities for home.    Time 4   or 5 total sessions   Period Weeks    Status On-going             Plan - 10/13/19 1233    Clinical Impression Statement Mr. Martindale presents with functionally moderate cognitive communication deficits characterized by impaired attention (selective, attention to detail), working and visual memory, short-term recall, emergent awareness, as well as higher level functions such as organization and problem solving. Pt was hospitalized briefly for encephalopathy and possible seizure; family supportive and eager to put structure into place to assist pt in his independence. Patient would benefit from skilled ST to train in compensatory strategies for areas of cognitive deficits in order to improve functioning in daily activities and increase safety and quality of life. As patient is undergoing treatment for cancer, his ability to attend appointments regularly is limited; this may impact his ability to progress with therapy. Renewed certification today as pt missed appointments due to hospitalization.    Speech Therapy Frequency --   recommended 1x a week for 8 weeks; pt requests every other week   Duration --   1x every other week for 8 weeks   Potential to Achieve Goals Fair    Potential Considerations Ability to learn/carryover information;Other (comment)   awareness, unable to attend therapy regularly   Louise Pt to bring planner or notebook    Consulted and Agree with Plan of Care Patient;Family member/caregiver           Patient will benefit from skilled therapeutic intervention in order to improve the following deficits and impairments:   Cognitive communication deficit    Problem List Patient Active Problem List   Diagnosis Date Noted  . Acute encephalopathy 09/11/2019  . Fever 09/11/2019  . Port-A-Cath in place 09/11/2019  . Rectal adenocarcinoma ([place]) 06/18/2019  . 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 Benign prostatic hyperplasia 03/30/2013   Deneise Lever, [place], [place] Speech-Language  Pathologist  Aliene Altes 10/13/2019, 12:36 PM  [facility] [address] [place], [place], [address] Phone: [phone]   Fax:  [phone]   Name: Carlos [name] MRN: [id number] Date of Birth: Mar 25, 1945

## 2019-10-13 NOTE — Patient Instructions (Signed)
Think about building onto a successful routine vs making a new one. (Add work prep activities to the end of the morning routine)  Try a basket by your door to collect items (keys, wallet, etc) put them there as soon as you get home and check basket before you leave the house. Place items you need to take with you as you prep for work- (lunch, etc).   Use checklists and timers for tasks to help stay on task (laundry)  Bills: pick one or two days (say 1st and 15th) to pay bills and write it on the calendar. Make a list of your bills and monthly checkbox, check off each month when paid.  Rather than adding a bunch of items to the schedule (leisure activities, etc), focus at first on the daily tasks that need to be done (laundry, meals, etc). If those are going well, then consider adding more complex activities.  Try putting all your checklists and schedules in a binder. Use a dry erase marker to check things off when done.  Look in to automatic pill planners; you can find some for under $100 on [place].

## 2019-10-16 ENCOUNTER — Telehealth: Payer: Self-pay

## 2019-10-16 NOTE — Telephone Encounter (Signed)
Carlos Carlos [name]' brother Carlos [name] called stating that Carlos [name] is not eating well and is loosing weight.  He is requesting a stimulant for appetite.  He also states that the hydrocodone is not helping his pain.  I told him I will need to speak with Carlos [name] regarding his pain. I spoke with Carlos [name].  He states that he eats a large breakfast but does not eat much the rest of the day.  He states he weighs around 130 lbs and that has been stable.  He states that he has started using topical lidocaine on his rectum and that is significantly helping his pain.  He did say that the hydrocodone was not controling pain will.  He states at this point he is satisfied with using the topical lidocaine.

## 2019-10-19 ENCOUNTER — Telehealth: Payer: Self-pay

## 2019-10-19 DIAGNOSIS — C20 Malignant neoplasm of rectum: Secondary | ICD-10-CM

## 2019-10-19 DIAGNOSIS — K6289 Other specified diseases of anus and rectum: Secondary | ICD-10-CM

## 2019-10-19 MED ORDER — LIDOCAINE (ANORECTAL) 50 MG RE SUPP: 50.0000 mg | Freq: Three times a day (TID) | RECTAL | 2 refills | Status: DC | PRN

## 2019-10-19 NOTE — Telephone Encounter (Signed)
Mr Keech' sister Sarina Ill called requesting appetite stimulant and lidocaine suppositories for her brother.  I reviewed with Dr. Burr Medico.  She will address an appetite stimulant at his next appt.  She will rx lidocine suppositories. I left a vm for MS Banks regarding the above.

## 2019-10-20 ENCOUNTER — Emergency Department (HOSPITAL_COMMUNITY)
Admission: EM | Admit: 2019-10-20 | Discharge: 2019-10-20 | Disposition: A | Discharge status: Home / Self Care | Attending: Emergency Medicine | Admitting: Emergency Medicine

## 2019-10-20 ENCOUNTER — Encounter (HOSPITAL_COMMUNITY): Payer: Self-pay | Admitting: Emergency Medicine

## 2019-10-20 DIAGNOSIS — Y92242 Post office as the place of occurrence of the external cause: Secondary | ICD-10-CM | POA: Insufficient documentation

## 2019-10-20 DIAGNOSIS — Z79899 Other long term (current) drug therapy: Secondary | ICD-10-CM | POA: Diagnosis not present

## 2019-10-20 DIAGNOSIS — W010XXA Fall on same level from slipping, tripping and stumbling without subsequent striking against object, initial encounter: Secondary | ICD-10-CM | POA: Diagnosis not present

## 2019-10-20 DIAGNOSIS — Y990 Civilian activity done for income or pay: Secondary | ICD-10-CM | POA: Insufficient documentation

## 2019-10-20 DIAGNOSIS — Y939 Activity, unspecified: Secondary | ICD-10-CM | POA: Insufficient documentation

## 2019-10-20 DIAGNOSIS — W19XXXA Unspecified fall, initial encounter: Secondary | ICD-10-CM

## 2019-10-20 DIAGNOSIS — S0990XA Unspecified injury of head, initial encounter: Secondary | ICD-10-CM | POA: Insufficient documentation

## 2019-10-20 DIAGNOSIS — Z85040 Personal history of malignant carcinoid tumor of rectum: Secondary | ICD-10-CM | POA: Diagnosis not present

## 2019-10-20 NOTE — ED Notes (Signed)
Patient verbalizes understanding of discharge instructions. Opportunity for questioning and answers were provided. Armband removed by staff, pt discharged from ED. Pt. ambulatory and discharged home.  

## 2019-10-20 NOTE — ED Provider Notes (Signed)
[facility] EMERGENCY DEPARTMENT Provider Note   CSN: [id number] Arrival date & time: 10/20/19  0005     History Chief Complaint  Patient presents with  . Head Injury    Carlos [name] is a 74 y.o. male with PMH significant for stage III rectal cancer followed by Dr. Burr Medico who presents to the ED after sustaining mechanical fall at work.  Patient works at the [employer] and at approximately 10 PM tripped and fell forward landing on his outstretched arms and forehead.  Patient has a small, linear, well approximated, nonbleeding laceration above his left eyebrow measuring approximately 2 cm.  Patient was sent here by his employer for evaluation.  He denies any current headache symptoms, LOC, numbness or weakness, ataxia, blurred vision, or other focal deficits.  He is not on any blood thinners.  He is on Keppra for a seizure that he sustained last month, however has already been followed by neurology and has had an EEG and MRIs performed.  He adamantly denies seizure is provoking factor for his fall and maintains that he simply tripped.  I reviewed patient's medical record and he has had multiple unremarkable imaging of head performed in early August 2021.  He reports that tetanus was updated last month.  HPI     Past Medical History:  Diagnosis Date  . Anxiety   . Benign prostatic hyperplasia 03/30/2013   10/1 IMO update  . Cancer ([place])   . 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 Acute encephalopathy 09/11/2019  . Fever 09/11/2019  . Port-A-Cath in place 09/11/2019  . Rectal adenocarcinoma ([place]) 06/18/2019  . 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 Benign prostatic hyperplasia 03/30/2013    Past Surgical History:  Procedure Laterality Date  . PORTACATH PLACEMENT N/A 07/01/2019   Procedure: PORT ULTRASOUND GUIDED PLACEMENT;  Surgeon: Alphonsa Overall, MD;   Location: [facility];  Service: General;  Laterality: N/A;  . PROSTATE SURGERY  2004  . TONSILECTOMY, ADENOIDECTOMY, BILATERAL MYRINGOTOMY AND TUBES         Family History  Problem Relation Age of Onset  . Osteoporosis Mother   . Dementia Mother   . Diabetes Mother   . Brain cancer Father     Social History   Tobacco Use  . Smoking status: Never Smoker  . Smokeless tobacco: Never Used  Vaping Use  . Vaping Use: Never used  Substance Use Topics  . Alcohol use: No  . Drug use: No    Home Medications Prior to Admission medications   Medication Sig Start Date End Date Taking? Authorizing Provider  capecitabine (XELODA) 500 MG tablet Take 3 tablets (1,500 mg total) by mouth 2 (two) times daily after a meal. Take on days of radiation, Monday through Friday 10/08/19   Truitt Merle, MD  HYDROcodone-Acetaminophen 5-300 MG TABS Take 1 tablet by mouth every 8 (eight) hours as needed (for severe pain as needed). 10/09/19   Truitt Merle, MD  levETIRAcetam (KEPPRA) 1000 MG tablet Take 1 tablet (1,000 mg total) by mouth 2 (two) times daily. 09/23/19   Cameron Sprang, MD  Lidocaine, Anorectal, 50 MG SUPP Place 50 mg rectally every 8 (eight) hours as needed. 10/19/19   Truitt Merle, MD  traMADol (ULTRAM) 50 MG tablet Take 1 tablet (50 mg total) by mouth every 6 (six) hours as needed for moderate pain. 09/28/19 09/27/20  Truitt Merle, MD  Allergies    Patient has no known allergies.  Review of Systems   Review of Systems  All other systems reviewed and are negative.   Physical Exam Updated Vital Signs BP 128/69   Pulse 69   Temp 97.7 F (36.5 C) (Oral)   Resp 15   SpO2 100%   Physical Exam Vitals and nursing note reviewed. Exam conducted with a chaperone present.  Constitutional:      Appearance: Normal appearance.  HENT:     Head: Normocephalic and atraumatic.  Eyes:     General: No scleral icterus.    Extraocular Movements: Extraocular movements intact.     Conjunctiva/sclera: Conjunctivae  normal.     Pupils: Pupils are equal, round, and reactive to light.  Neck:     Comments: No midline cervical tenderness palpation.  ROM fully intact. Cardiovascular:     Rate and Rhythm: Normal rate and regular rhythm.     Pulses: Normal pulses.     Heart sounds: Normal heart sounds.  Pulmonary:     Effort: Pulmonary effort is normal. No respiratory distress.     Breath sounds: Normal breath sounds.  Musculoskeletal:     Cervical back: Normal range of motion and neck supple. No rigidity.  Skin:    General: Skin is dry.     Capillary Refill: Capillary refill takes less than 2 seconds.  Neurological:     General: No focal deficit present.     Mental Status: He is alert and oriented to person, place, and time.     GCS: GCS eye subscore is 4. GCS verbal subscore is 5. GCS motor subscore is 6.     Cranial Nerves: No cranial nerve deficit.     Sensory: No sensory deficit.     Motor: No weakness.     Coordination: Coordination normal.     Gait: Gait normal.     Comments: CN II to XII grossly intact.  PERRL and EOM intact.  Negative cerebellar and Romberg exams.  Ambulates without ataxia.  Psychiatric:        Mood and Affect: Mood normal.        Behavior: Behavior normal.        Thought Content: Thought content normal.     ED Results / Procedures / Treatments   Labs (all labs ordered are listed, but only abnormal results are displayed) Labs Reviewed - No data to display  EKG None  Radiology No results found.  Procedures Procedures (including critical care time)  Medications Ordered in ED Medications - No data to display  ED Course  I have reviewed the triage vital signs and the nursing notes.  Pertinent labs & imaging results that were available during my care of the patient were reviewed by me and considered in my medical decision making (see chart for details).    MDM Rules/Calculators/A&P                          Patient has a mild, nonbleeding laceration that he  reports stopped bleeding very shortly after initial fall.  It is already well approximated and it has been over 12 hours since his initial fall.  Repair not warranted.  Instead, we will clean and apply topical bacitracin and encouraged him to keep it covered and clean.  We will update patient's tetanus with Boostrix injection here in the ED.  Patient with head injury which did not cause of loss of consciousness and without  headache since the initial trauma.  No evidence of skull fracture on physical exam. Patient is not taking anticoagulants and there is no history of subarachnoid or subdural hemorrhage. Patient denies nausea, vomiting, amnesia, vision changes,cognitive or memory dysfunction and vertigo.  Patient with no focal neurological deficits on physical exam.  Discussed thoroughly symptoms to return to the emergency department including severe headaches, disequilibrium, vomiting, double vision, extremity weakness, difficulty ambulating, or any other concerning symptoms.  Discussed the risk versus benefit of CT scan at this time I do not believe he warrants one. Patient agrees that CT is not indicated at this time.  Patient will be discharged with information pertaining to diagnosis and advised to use over-the-counter Tylenol for pain relief.  Discussed case with Dr. Alvino Chapel who agrees with assessment and plan.    Patient and/or family were informed that while patient is appropriate for discharge at this time, some medical emergencies may only develop or become detectable after a period of time.  I specifically instructed patient and/or family to return to return to the ED or seek immediate medical attention for any new or worsening symptoms.  They were provided opportunity to ask any additional questions and have none at this time.  Prior to discharge patient is feeling well, agreeable with plan for discharge home.  They have expressed understanding of verbal discharge instructions as well as return  precautions and are agreeable to the plan.    Final Clinical Impression(s) / ED Diagnoses Final diagnoses:  Fall, initial encounter    Rx / DC Orders ED Discharge Orders    None       Corena Herter, PA-C 10/20/19 1346    Davonna Belling, MD 10/20/19 1512

## 2019-10-20 NOTE — Discharge Instructions (Signed)
Please follow-up with your primary care provider regarding today's encounter.  You will also need to follow-up with your oncologist and neurology, as scheduled.  Your physical exam is remarkable for suspicion for intracranial bleed at this time.  Please continue to keep the laceration clean and covered.  Apply topical bacitracin, as directed.    Return to the ED or seek immediate medical attention should you experience any significant headache, blurred vision, numbness or weakness, other neurologic deficits, or any other new or worsening symptoms.

## 2019-10-20 NOTE — ED Triage Notes (Signed)
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 Pt denies LOC, denies dizziness or blurred vision. Bleeding controlled  Pt has workers comp documents to be completed.

## 2019-10-22 ENCOUNTER — Ambulatory Visit
Admission: RE | Admit: 2019-10-22 | Discharge: 2019-10-22 | Disposition: A | Discharge status: Home / Self Care | Payer: 59 | Source: Ambulatory Visit | Attending: Radiation Oncology | Admitting: Radiation Oncology

## 2019-10-22 ENCOUNTER — Ambulatory Visit: Payer: 59 | Admitting: Radiation Oncology

## 2019-10-22 ENCOUNTER — Encounter: Payer: Self-pay | Admitting: Radiation Oncology

## 2019-10-22 ENCOUNTER — Other Ambulatory Visit: Payer: Self-pay

## 2019-10-22 VITALS — BP 134/86 | HR 75 | Temp 98.2°F | Resp 18 | Ht 67.0 in | Wt 132.2 lb

## 2019-10-22 DIAGNOSIS — Z79899 Other long term (current) drug therapy: Secondary | ICD-10-CM | POA: Insufficient documentation

## 2019-10-22 DIAGNOSIS — Z808 Family history of malignant neoplasm of other organs or systems: Secondary | ICD-10-CM | POA: Insufficient documentation

## 2019-10-22 DIAGNOSIS — G3184 Mild cognitive impairment, so stated: Secondary | ICD-10-CM | POA: Diagnosis not present

## 2019-10-22 DIAGNOSIS — D123 Benign neoplasm of transverse colon: Secondary | ICD-10-CM | POA: Insufficient documentation

## 2019-10-22 DIAGNOSIS — C20 Malignant neoplasm of rectum: Secondary | ICD-10-CM

## 2019-10-22 DIAGNOSIS — N200 Calculus of kidney: Secondary | ICD-10-CM | POA: Diagnosis not present

## 2019-10-22 DIAGNOSIS — I700 Atherosclerosis of aorta: Secondary | ICD-10-CM | POA: Diagnosis not present

## 2019-10-22 DIAGNOSIS — M4317 Spondylolisthesis, lumbosacral region: Secondary | ICD-10-CM | POA: Insufficient documentation

## 2019-10-22 DIAGNOSIS — F419 Anxiety disorder, unspecified: Secondary | ICD-10-CM | POA: Insufficient documentation

## 2019-10-22 DIAGNOSIS — Z9221 Personal history of antineoplastic chemotherapy: Secondary | ICD-10-CM | POA: Insufficient documentation

## 2019-10-22 DIAGNOSIS — N400 Enlarged prostate without lower urinary tract symptoms: Secondary | ICD-10-CM | POA: Diagnosis not present

## 2019-10-22 NOTE — Progress Notes (Signed)
Radiation Oncology         [phone] ________________________________  Name: [name]        MRN: [id number]  Date of Service: 10/22/2019 DOB: 29-Apr-1945  CC:[name], MD  Truitt Merle, MD     REFERRING PHYSICIAN: Truitt Merle, MD   DIAGNOSIS: The encounter diagnosis was Rectal adenocarcinoma [facility]).   HISTORY OF PRESENT ILLNESS: Carlos [name] is a 74 y.o. male with a history of rectal cancer. The patient had been noting increasing looseness of stool over approximately 1 years time and presented to GI for further evaluation.  He underwent colonoscopy with Dr. Therisa Doyne on 05/29/2019 which revealed a polyp at the hepatic flexure, and a mass in the proximal rectum.  CT scans revealed some possible chicken changes of the distal colon/upper rectum, consistent with the biopsy findings, a 3 to 4 mm area of pulmonary nodularity was seen in the right lung base as well.  He underwent an MRI on 06/12/2019 which revealed T4N1 disease in the pelvis specifically involving the left lateral pelvic sidewall,  and he is scheduled to undergo PET scan on Monday.  He has met with Dr. Marcello Moores, and given the extent of involvement would proceed surgically with Dr. Morton Stall at [facility]  He met with Dr. Burr Medico, and has been offered total neoadjuvant chemotherapy.  We met with the patient about 3 months ago, and he began total neoadjuvant chemotherapy on 07/02/2019, and received his eighth cycle on 10/08/19.  He is seen today to discuss treatment recommendations for chemoradiation.    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 He has never used smokeless tobacco. He reports that he does not drink alcohol and does not use drugs.  The patient is single.  He lives in [place].  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   ALLERGIES: Patient has no known allergies.   MEDICATIONS:  Current Outpatient Medications  Medication Sig Dispense Refill   capecitabine (XELODA) 500 MG tablet Take 3 tablets (1,500 mg total) by mouth 2 (two) times daily after a meal. Take on days of radiation, Monday through Friday 90 tablet 1   HYDROcodone-Acetaminophen 5-300 MG TABS Take 1 tablet by mouth every 8 (eight) hours as needed (for severe pain as needed). 20 tablet 0   levETIRAcetam (KEPPRA) 1000 MG tablet Take 1 tablet (1,000 mg total) by mouth 2 (two) times daily. 60 tablet 11   Lidocaine, Anorectal, 50 MG SUPP Place 50 mg rectally every 8 (eight) hours as needed. 14 suppository 2   traMADol (ULTRAM) 50 MG tablet Take 1 tablet (  50 mg total) by mouth every 6 (six) hours as needed for moderate pain. 30 tablet 0   No current facility-administered medications for this encounter.     REVIEW OF SYSTEMS: On review of systems, the patient reports that he is doing well overall. He does have some discomfort in the rectal area and has noted this is somewhat external and when he passes stool. He has tried several OTC medications for hemorrhoids. His bowels are not moving as frequently as before chemo, but he does notice some infrequent red mucous but not frank bright red blood. He has had some dry hyperpigmented skin of  his hands. No other complaints are verbalized.     PHYSICAL EXAM:  Wt Readings from Last 3 Encounters:  10/08/19 133 lb 8 oz (60.6 kg)  09/24/19 133 lb 6.4 oz (60.5 kg)  09/23/19 134 lb (60.8 kg)   Temp Readings from Last 3 Encounters:  10/20/19 97.7 F (36.5 C) (Oral)  10/10/19 (!) 96.1 F (35.6 C) (Tympanic)  10/08/19 97.9 F (36.6 C) (Oral)   BP Readings from Last 3 Encounters:  10/20/19 128/69  10/10/19 126/76  10/08/19 (!) 149/88   Pulse Readings from Last 3 Encounters:  10/20/19 69  10/10/19 74  10/08/19 95   In general this is a well appearing African-American in no acute distress. He's alert and oriented x4 and appropriate throughout the examination. Cardiopulmonary assessment is negative for acute distress and he exhibits normal effort. He has hyperpigmented changes of his ands and nails without punctate lesion or infectious appearing rash.  ECOG = 1  0 - Asymptomatic (Fully active, able to carry on all predisease activities without restriction)  1 - Symptomatic but completely ambulatory (Restricted in physically strenuous activity but ambulatory and able to carry out work of a light or sedentary nature. For example, light housework, office work)  2 - Symptomatic, <50% in bed during the day (Ambulatory and capable of all self care but unable to carry out any work activities. Up and about more than 50% of waking hours)  3 - Symptomatic, >50% in bed, but not bedbound (Capable of only limited self-care, confined to bed or chair 50% or more of waking hours)  4 - Bedbound (Completely disabled. Cannot carry on any self-care. Totally confined to bed or chair)  5 - Death   Eustace Pen MM, Creech RH, Tormey DC, et al. [phone]). "Toxicity and response criteria of the [facility]". [place] Oncol. 5 (6): 649-55    LABORATORY DATA:  Lab Results  Component Value Date   WBC 5.2 10/08/2019   HGB 12.1 (L) 10/08/2019   HCT 36.1 (L) 10/08/2019   MCV 88.3  10/08/2019   PLT 183 10/08/2019   Lab Results  Component Value Date   NA 141 10/08/2019   K 3.7 10/08/2019   CL 105 10/08/2019   CO2 27 10/08/2019   Lab Results  Component Value Date   ALT 18 10/08/2019   AST 27 10/08/2019   ALKPHOS 86 10/08/2019   BILITOT 0.3 10/08/2019      RADIOGRAPHY: CT Abdomen Pelvis W Contrast  Result Date: 09/23/2019 CLINICAL DATA:  Restaging of rectal cancer diagnosed about 3 months ago. Chemotherapy in progress. EXAM: CT ABDOMEN AND PELVIS WITH CONTRAST TECHNIQUE: Multidetector CT imaging of the abdomen and pelvis was performed using the standard protocol following bolus administration of intravenous contrast. CONTRAST:  125m OMNIPAQUE IOHEXOL 300 MG/ML  SOLN COMPARISON:  PET-CT 07/13/2019 FINDINGS: Lower chest: Stable scarring in  the posterior basal segments of both lower lobes. Hepatobiliary: Multiple large gallstones filling the gallbladder. Nitrogen gas phenomenon within the stones. No hepatic mass identified. No significant focal hepatic lesion is observed. No biliary dilatation. Pancreas: Unremarkable Spleen: Unremarkable Adrenals/Urinary Tract: Complex bilateral renal lesions are similar to prior exams. The hyperdense small right kidney upper pole and larger left mid kidney lesions are stable and probably hemorrhagic cysts. There is a 4 mm left kidney lower pole nonobstructive renal calculus, and a septated cystic lesion anteriorly in the right mid kidney both which appear stable. Stomach/Bowel: Solid-appearing left eccentric upper rectal mass measures about 6.7 by 2.9 cm on image 58/2. This is difficult to compare directly to the prior exam given differences in orientation and degree of distension of the rectum. There is some prominence of stool in the sigmoid colon and rectum on today's examination, but the mass is not obstructive. Vascular/Lymphatic: Aortoiliac atherosclerotic vascular disease. Reproductive: Mild prostatomegaly with nodular prominence the  upper margin of the prostate gland. Other: No supplemental non-categorized findings. Musculoskeletal: Bridging spurring of the left sacroiliac joint. Grade 1 degenerative anterolisthesis of L5 on S1. Lumbar spondylosis and degenerative disc disease causing multilevel foraminal impingement. IMPRESSION: 1. Solid-appearing left eccentric upper rectal mass measures about 6.7 by 2.9 cm. This is difficult to compare directly to the prior exam given differences in orientation and degree of distension of the rectum. There is some prominence of stool in the sigmoid colon and rectum on today's examination, but the mass is not obstructive. 2. Other imaging findings of potential clinical significance: Cholelithiasis. Complex bilateral renal lesions are similar to prior exams. Nonobstructive left nephrolithiasis. Mild prostatomegaly with nodular prominence the upper margin of the prostate gland. Lumbar spondylosis and degenerative disc disease causing multilevel foraminal impingement. 3. Aortic atherosclerosis. Aortic Atherosclerosis (ICD10-I70.0). Electronically Signed   By: Van Clines M.D.   On: 09/23/2019 15:49       IMPRESSION/PLAN: 1. Stage III, ST4H9Q2 adenocarcinoma of the rectum. Dr. Lisbeth Renshaw reviews the patient's course since his last visit in our office.  He has since met with Dr. Morton Stall, and has plans to follow-up in that surgical clinic at [facility]  We will notify Dr. Morton Stall of our plans.  Dr. Lisbeth Renshaw discusses that the patient has done well since his systemic chemotherapy and is ready to proceed with simulation and subsequent treatment.  We anticipate his treatment start date would be 11/02/19. We discussed the risks, benefits, short, and long term effects of radiotherapy, and the patient is interested in proceeding. Dr. Lisbeth Renshaw discusses the delivery and logistics of radiotherapy and recommends 5 1/2 weeks of radiotherapy.  He will simulate on Monday 10/26/19. Written consent is obtained and placed in the  chart, a copy was provided to the patient.  I will notify Dr. Morton Stall that we would anticipate his treatment would be completed by 12/11/19.  In a visit lasting 60 minutes, greater than 50% of the time was spent face to face discussing the patient's condition, in preparation for the discussion, and coordinating the patient's care.   The above documentation reflects my direct findings during this shared patient visit. Please see the separate note by Dr. Lisbeth Renshaw on this date for the remainder of the patient's plan of care.    Carola Rhine, PAC

## 2019-10-23 ENCOUNTER — Ambulatory Visit: Payer: 59

## 2019-10-26 ENCOUNTER — Ambulatory Visit
Admission: RE | Admit: 2019-10-26 | Discharge: 2019-10-26 | Disposition: A | Discharge status: Home / Self Care | Payer: 59 | Source: Ambulatory Visit | Attending: Radiation Oncology | Admitting: Radiation Oncology

## 2019-10-26 ENCOUNTER — Telehealth: Payer: Self-pay | Admitting: Hematology

## 2019-10-26 ENCOUNTER — Other Ambulatory Visit: Payer: Self-pay

## 2019-10-26 DIAGNOSIS — Z510 Encounter for antineoplastic radiation therapy: Secondary | ICD-10-CM | POA: Diagnosis not present

## 2019-10-26 NOTE — Telephone Encounter (Signed)
Oral Chemotherapy Pharmacist Encounter  I spoke with patient's sister, Carlos [name], for overview of: Xeloda (capecitabine) for the treatment of locally advanced stage III rectal cancer in conjunction with radiation, planned duration 5 1/2 - 6 weeks.   Counseled on administration, dosing, side effects, monitoring, drug-food interactions, safe handling, storage, and disposal.  Patient will take Xeloda 500mg  tablets, 3 tablets (1500mg ) by mouth in AM and 3 tabs (1500mg ) by mouth in PM, within 30 minutes of finishing meals, on days of radiation only.  Xeloda and radiation start date: 11/02/19  Adverse effects of Xeloda include but are not limited to: fatigue, decreased blood counts, GI upset, diarrhea, mouth sores, and hand-foot syndrome.  Patient will obtain anti diarrheal and alert the office of 4 or more loose stools above baseline.   Reviewed importance of keeping a medication schedule and plan for any missed doses. No barriers to medication adherence identified.  Medication reconciliation performed and medication/allergy list updated.  Insurance authorization for Xeloda has been obtained. Patient's insurance requires that medication be filled through [facility]. [facility] stated medication was delivered to patient's home on 10/16/19. Patient's sister provided this information.   All questions answered.  Ms. Carlos [name] voiced understanding and appreciation.   Medication education handout placed in mail for patient. Patient and family know to call the office with questions or concerns. Oral Chemotherapy Clinic phone number provided to patient.   Leron Croak, PharmD, BCPS Hematology/Oncology Clinical Pharmacist [facility] [phone] 10/26/2019 3:12 PM

## 2019-10-26 NOTE — Telephone Encounter (Signed)
R/s 9/23 appt per patient request. Called and spoke with patient. Confirmed new appt

## 2019-10-27 ENCOUNTER — Ambulatory Visit: Payer: 59 | Admitting: Speech Pathology

## 2019-10-27 DIAGNOSIS — R41841 Cognitive communication deficit: Secondary | ICD-10-CM

## 2019-10-27 NOTE — Telephone Encounter (Signed)
error 

## 2019-10-27 NOTE — Patient Instructions (Signed)
You will need some support to make new habits.  Follow a regular routine at home to help you get your priorities accomplished.  Use a schedule and checklists to help you keep focused.  Use your weekly planner; it might help to put this in binder.  -Write down all of your appointments  -Write down when you go to work - Have [name] help you identify 3-5 priorities for the week (laundry, etc. These should be "must do" activities, not optional tasks). Write them in the "To Do" section - Schedule these priorities on your calendar.  -Family can remind you to check your schedule (see what's scheduled for the day, see if you accomplished what you scheduled the previous day).  -Make a new schedule for the following week every Sunday.

## 2019-10-27 NOTE — Therapy (Signed)
[facility] [address] [place], [place], [address] Phone: [phone]   Fax:  [phone]  Speech Language Pathology Treatment  Patient Details  Name: Carlos [name] MRN: [id number] Date of Birth: 11/04/1945 Referring Provider (SLP): Dr. Delice Lesch   Encounter Date: 10/27/2019   End of Session - 10/27/19 1152    Visit Number 4    Number of Visits 5    Date for SLP Re-Evaluation 12/12/19    SLP Start Time 1150    SLP Stop Time  1230    SLP Time Calculation (min) 40 min    Activity Tolerance Patient tolerated treatment well           Past Medical History:  Diagnosis Date  . Anxiety   . Benign prostatic hyperplasia 03/30/2013   10/1 IMO update  . Cancer ([place])   . 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/23/2019    Past Surgical History:  Procedure Laterality Date  . PORTACATH PLACEMENT N/A 07/01/2019   Procedure: PORT ULTRASOUND GUIDED PLACEMENT;  Surgeon: Alphonsa Overall, MD;  Location: [facility];  Service: General;  Laterality: N/A;  . PROSTATE SURGERY  2004  . TONSILECTOMY, ADENOIDECTOMY, BILATERAL MYRINGOTOMY AND TUBES      There were no vitals filed for this visit.   Subjective Assessment - 10/27/19 1150    Subjective "I haven't done much."    Patient is accompained by: Family member   sister [name]   Currently in Pain? No/denies                 ADULT SLP TREATMENT - 10/27/19 1150      General Information   Behavior/Cognition Alert;Distractible      Treatment Provided   Treatment provided Cognitive-Linquistic      Pain Assessment   Pain Assessment No/denies pain      Cognitive-Linquistic Treatment   Treatment focused on Cognition;Patient/family/caregiver education    Skilled Treatment ED visit on 10/20/19 for fall at work; tripped over a pallet sustained non-bleeding laceration of scalp; per MD no focal neurologic deficits and they felt CT was not necessary. Is being referred for  PT. SLP suspects attention deficits may have contributed to pt's fall, and he agreed with this. Pt and sister did not bring schedule or checklists. Pt required frequent redirection for topic maintenance; asked same question x2 this session (which he has also asked in previous sessions). Provided weekly planner handout at pt suggestion that his brother might be able to help him ID goals for the week. Pt ID'd 2 goals with min cues and wrote in the "To Do" section. Required cues for impulsivity, sustained attention when inputting work and appointment information. Copies given for sister. Recommended keeping materials in binder. Educated that due to attention and memory deficits pt will need support and regular cuing to be able to maintain a schedule and routine. Pt wanted to schedule one follow-up visit in ~4 weeks.      Assessment / Recommendations / Plan   Plan Continue with current plan of care      Progression Toward Goals   Progression toward goals Not progressing toward goals (comment)   unable to attend at recommended frequency, awareness hinder           SLP Education - 10/27/19 1439    Education Details assistance from family will be necessary for pt to carry over strategies due to deficits    Person(s) Educated Patient   sister [name]   Methods Explanation  Comprehension Verbalized understanding              SLP Long Term Goals - 10/27/19 1444      SLP LONG TERM GOAL #1   Title Patient will demonstrate carryover of memory compensation system x 2 visits.    Time 3   or 5 total sessions   Period Weeks    Status On-going      SLP LONG TERM GOAL #2   Title Patient will demonstrate knowledge of appropriate cognitive activities for home.    Time 3   or 5 total sessions   Period Weeks    Status On-going            Plan - 10/27/19 1442    Clinical Impression Statement Mr. Lefebre presents with functionally moderate cognitive communication deficits characterized by impaired  attention (selective, attention to detail), working and visual memory, short-term recall, emergent awareness, as well as higher level functions such as organization and problem solving. Family supportive and eager to put structure into place to assist pt in his independence, however require ongoing training and education regarding how to implement this. Patient would benefit from skilled ST to train in compensatory strategies for areas of cognitive deficits in order to improve functioning in daily activities and increase safety and quality of life. As patient is undergoing treatment for cancer, his ability to attend appointments regularly is limited; this may impact his ability to progress with therapy.    Speech Therapy Frequency --   recommended 1x a week for 8 weeks; pt requests every other week   Duration --   1x every other week for 8 weeks   Potential to Achieve Goals Fair    Potential Considerations Ability to learn/carryover information;Other (comment)   awareness, unable to attend therapy regularly   [place] Pt to bring planner or notebook    Consulted and Agree with Plan of Care Patient;Family member/caregiver           Patient will benefit from skilled therapeutic intervention in order to improve the following deficits and impairments:   Cognitive communication deficit    Problem List Patient Active Problem List   Diagnosis Date Noted  . Acute encephalopathy 09/11/2019  . Fever 09/11/2019  . Port-A-Cath in place 09/11/2019  . 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 Benign prostatic hyperplasia 03/30/2013   Deneise Lever, [place], [place] Speech-Language Pathologist  Aliene Altes 10/27/2019, 2:45 PM  [facility] [address] [place], [place], [address] Phone: [phone]   Fax:  [phone]   Name: Carlos [name] MRN:  [id number] Date of Birth: 11-24-1945

## 2019-10-29 ENCOUNTER — Inpatient Hospital Stay: Payer: 59 | Admitting: Nurse Practitioner

## 2019-10-29 ENCOUNTER — Telehealth: Payer: Self-pay

## 2019-10-29 ENCOUNTER — Inpatient Hospital Stay: Payer: 59

## 2019-10-29 NOTE — Telephone Encounter (Signed)
I spoke with [name]' sister [name] regarding additional appts for Monday 11/02/2019 at [facility] and after rad onc treatment.  I let her know dr. Burr Medico wants to discuss Xeloda treatment with [name] prior to radiation therapy.  She verbalized understanding.  Ms Volanda Napoleon said their brother [name] will be accompanying him to this appt

## 2019-10-30 DIAGNOSIS — Z510 Encounter for antineoplastic radiation therapy: Secondary | ICD-10-CM | POA: Diagnosis not present

## 2019-10-30 NOTE — Progress Notes (Signed)
[facility]   Telephone:([phone] Fax:([phone]) [phone]   Clinic Follow up Note   Patient Care Team: [name], MD as PCP - General (Family Medicine) Cameron Sprang, MD as Consulting Physician (Neurology) Jonnie Finner, RN as Oncology Nurse Navigator Truitt Merle, MD as Consulting Physician (Hematology) Ronnette Juniper, MD as Consulting Physician (Gastroenterology)  Date of Service:  11/02/2019  CHIEF COMPLAINT: F/u rectal cancer  SUMMARY OF ONCOLOGIC HISTORY: Oncology History Overview Note  Cancer Staging Rectal adenocarcinoma [facility]) Staging form: Colon and Rectum, AJCC 8th Edition - Clinical stage from 06/12/2019: cT4, cN1 - Unsigned    Rectal adenocarcinoma ([place])  05/29/2019 Procedure   Colonoscopy per Dr. Therisa Doyne A digital rectal exam was normal  frond-like fungating infiltrative non-obstructing large mass in the rectum. The mass was partially circumferential, measured 5 cm in length. The colonoscopy also showed 11 sessile polyps in the transverse and asecnding colon and hepatic flexure.    05/29/2019 Initial Biopsy   Pathology on the rectal biopsy showed at least intramucosal adenocarcinoma involving tubulovillous adenoma with high grade dysplasia.   06/05/2019 Imaging   CT CAP IMPRESSION: 1. Cholelithiasis and gallbladder wall thickening with equivocal pericholecystic inflammation. Acute cholecystitis not excluded. If there is clinical suspicion for acute cholecystitis, recommend ultrasound evaluation. 2. Approximately 5 cm irregular mass within the distal sigmoid colon with possible extension through the wall. No definite abnormal adjacent lymph nodes. 3. Mild omental haziness-nonspecific. Metastatic disease not excluded. 4. Indeterminate 3-4 mm RIGHT pulmonary nodules which could represent metastatic disease. These are too small for PET CT evaluation. Consider CT follow-up. 5. 2.5 cm irregular cystic mass within the RIGHT kidney. Elective MRI  recommended for further evaluation. 6. 4 mm nonobstructing LEFT LOWER pole renal calculus. 7. Coronary artery disease. 8. Aortic Atherosclerosis (ICD10-I70.0).   06/12/2019 Imaging   Staging MRI pelvis  FINDINGS: TUMOR LOCATION Tumor distance from Anal Verge/Skin Surface:  11.4 cm Tumor distance to Internal Anal Sphincter: 6.5 cm TUMOR DESCRIPTION Circumferential Extent: 100% Tumor Length: 8.3 cm T - CATEGORY Extension through Muscularis Propria: Yes>27mm=T3d Shortest Distance of any tumor/node from Mesorectal Fascia: 0 mm, along the left lateral and posterior walls (tumor abuts the left lateral pelvic sidewall and sacrum) Extramural Vascular Invasion/Tumor Thrombus: No Invasion of Anterior Peritoneal Reflection: No Involvement of Adjacent Organs or Pelvic Sidewall: Yes, involving the left lateral pelvic sidewall =T4 Levator Ani Involvement: No N - CATEGORY Mesorectal Lymph Nodes >=43mm: 2=N1 Extra-mesorectal Lymphadenopathy: No Other:  None. IMPRESSION: Rectal adenocarcinoma T stage: T4 Rectal adenocarcinoma N stage:  N1 Distance from tumor to the internal anal sphincter is 6.5 cm.   06/18/2019 Initial Diagnosis   Rectal adenocarcinoma ([place])   07/02/2019 - 10/08/2019 Chemotherapy   Total Neoadjuvant FOLFOX q2weeks starting 07/02/19-10/08/19,  8 cycles   07/13/2019 PET scan   IMPRESSION: 1. Known rectal mass is intensely hypermetabolic compatible with primary rectal adenocarcinoma. No findings of FDG avid nodal metastasis or distant metastatic disease. 2. Hyperdense kidney cysts are identified bilaterally favored to represent hemorrhagic cyst. 3. Nonobstructing left renal calculi. 4. Aortic atherosclerosis, in addition to lad coronary artery disease. Please note that although the presence of coronary artery calcium documents the presence of coronary artery disease, the severity of this disease and any potential stenosis cannot be assessed on this non-gated CT examination.  Assessment for potential risk factor modification, dietary therapy or pharmacologic therapy may be warranted, if clinically indicated. Aortic Atherosclerosis (ICD10-I70.0). 5. Gallstones.   09/11/2019 - 09/14/2019 Hospital Admission   Acute  AMS, seizure 09/11/19 -Developed acute altered mental status day after Cycle 6 chemo (09/11/19).  -He was admitted for work up, EEG showed epileptic spike. ID work up negative, MRI negative for metastasis.    09/11/2019 Imaging   MRI Brain  IMPRESSION: Incomplete study. Images obtained reveal no acute abnormality. Negative for acute infarct.   Atrophy and mild ventricular enlargement stable from prior studies.   09/11/2019 Imaging   CT head  IMPRESSION: Mild ventricular prominence with normal appearing sulci. Question early communicating hydrocephalus. Brain parenchyma appears unremarkable. No acute infarct. No mass or hemorrhage.   Foci of arterial vascular calcification noted. There is mucosal thickening in several ethmoid air cells.   There is probable cerumen in the right external auditory canal.     09/23/2019 Imaging   CT AP  IMPRESSION: 1. Solid-appearing left eccentric upper rectal mass measures about 6.7 by 2.9 cm. This is difficult to compare directly to the prior exam given differences in orientation and degree of distension of the rectum. There is some prominence of stool in the sigmoid colon and rectum on today's examination, but the mass is not obstructive. 2. Other imaging findings of potential clinical significance: Cholelithiasis. Complex bilateral renal lesions are similar to prior exams. Nonobstructive left nephrolithiasis. Mild prostatomegaly with nodular prominence the upper margin of the prostate gland. Lumbar spondylosis and degenerative disc disease causing multilevel foraminal impingement. 3. Aortic atherosclerosis.   Aortic Atherosclerosis (ICD10-I70.0).   11/02/2019 -  Chemotherapy   Concurrent chemo RT with Oral  Xeloda 1500mg  BID on days of RT starting 11/02/19   11/02/2019 -  Radiation Therapy   Concurrent chemo RT by Dr Lisbeth Renshaw with Xeloda  starting 11/02/19      CURRENT THERAPY:  Concurrent chemo RT with Oral Xeloda 1500mg  BID starting 11/02/19  INTERVAL HISTORY:  Carlos [name] is here for a follow up and treatment. He presents to the clinic with his brother, he has recovered well from last chemotherapy which was 3 weeks ago.  He still has skin tightness in his palms, appetite and energy level are much better, he is eating well.  He works 8 hours a day.  He denies significant pain, abdominal discomfort or other new symptoms, no hematochezia or melena.  Review of system otherwise negative.   MEDICAL HISTORY:  Past Medical History:  Diagnosis Date  . Anxiety   . Benign prostatic hyperplasia 03/30/2013   10/1 IMO update  . Cancer ([place])   . 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 PORTACATH PLACEMENT N/A 07/01/2019   Procedure: PORT ULTRASOUND GUIDED PLACEMENT;  Surgeon: Alphonsa Overall, MD;  Location: [facility];  Service: General;  Laterality: N/A;  . PROSTATE SURGERY  2004  . TONSILECTOMY, ADENOIDECTOMY, BILATERAL MYRINGOTOMY AND TUBES      I have reviewed the social history and family history with the patient and they are unchanged from previous note.  ALLERGIES:  has No Known Allergies.  MEDICATIONS:  Current Outpatient Medications  Medication Sig Dispense Refill  . capecitabine (XELODA) 500 MG tablet Take 3 tablets (1,500 mg total) by mouth 2 (two) times daily after a meal. Take on days of radiation, Monday through Friday 90 tablet 1  . HYDROcodone-Acetaminophen 5-300 MG TABS Take 1 tablet by mouth every 8 (eight) hours as needed (for severe pain as needed). 20 tablet 0  . levETIRAcetam (KEPPRA) 1000 MG tablet Take 1 tablet (1,000 mg total) by mouth 2 (  two) times daily. 60 tablet 11  .  Lidocaine, Anorectal, 50 MG SUPP Place 50 mg rectally every 8 (eight) hours as needed. 14 suppository 2  . traMADol (ULTRAM) 50 MG tablet Take 1 tablet (50 mg total) by mouth every 6 (six) hours as needed for moderate pain. 30 tablet 0   No current facility-administered medications for this visit.    PHYSICAL EXAMINATION: ECOG PERFORMANCE STATUS: 1 - Symptomatic but completely ambulatory  Vitals:   11/02/19 0700  BP: (!) 146/82  Pulse: 98  Resp: 16  Temp: 97.8 F (36.6 C)  SpO2: 99%   Filed Weights   11/02/19 0700  Weight: 136 lb 11.2 oz (62 kg)    GENERAL:alert, no distress and comfortable SKIN: skin color, texture, turgor are normal, no rashes or significant lesions EYES: normal, Conjunctiva are pink and non-injected, sclera clear Musculoskeletal:no cyanosis of digits and no clubbing  NEURO: alert & oriented x 3 with fluent speech, no focal motor/sensory deficits  LABORATORY DATA:  I have reviewed the data as listed CBC Latest Ref Rng & Units 10/08/2019 09/24/2019 09/14/2019  WBC 4.0 - 10.5 K/uL 5.2 4.8 5.9  Hemoglobin 13.0 - 17.0 g/dL 12.1(L) 11.9(L) 11.9(L)  Hematocrit 39 - 52 % 36.1(L) 35.5(L) 36.4(L)  Platelets 150 - 400 K/uL 183 176 123(L)     CMP Latest Ref Rng & Units 10/08/2019 09/24/2019 09/14/2019  Glucose 70 - 99 mg/dL 136(H) 123(H) 97  BUN 8 - 23 mg/dL 11 6(L) 14  Creatinine 0.61 - 1.24 mg/dL 0.80 0.84 0.75  Sodium 135 - 145 mmol/L 141 141 138  Potassium 3.5 - 5.1 mmol/L 3.7 3.9 4.3  Chloride 98 - 111 mmol/L 105 105 108  CO2 22 - 32 mmol/L 27 26 23   Calcium 8.9 - 10.3 mg/dL 10.1 9.8 8.6(L)  Total Protein 6.5 - 8.1 g/dL 7.1 6.7 -  Total Bilirubin 0.3 - 1.2 mg/dL 0.3 0.4 -  Alkaline Phos 38 - 126 U/L 86 80 -  AST 15 - 41 U/L 27 36 -  ALT 0 - 44 U/L 18 26 -      RADIOGRAPHIC STUDIES: I have personally reviewed the radiological images as listed and agreed with the findings in the report. No results found.   ASSESSMENT & PLAN:  Carlos [name] is a 74  y.o. male with    1. Adenocarcinoma of the rectum, cT4N1M0 stage III -He was recently diagnosed in 05/2019 with locally advanced stage III rectal cancer.  -He recently completed neoadjuvant chemotherapy with 8 cycles of FOLFOX 07/02/19-10/08/19. Plan to start concurrent chemoRT with Xeloda 1500mg  BID for 5-6 weeks on 11/02/19. Then he will proceed with surgery after chemoRT. The treatment goal is curative. -He has recovered well from previous chemo, will proceed chemo and radiation today. -I reviewed the potential side effect from Xeloda, and how to take it with patient and his siblings.  He voiced good understanding. -We will monitor his lab weekly, plan to see him next week, then every two weeks.   2. Rectal Pain, secondary to #1 -onset in mid August.  -Previously on Tramadol, now on Hydrocodone and lidocaine suppositories.  -stable   3. Acute AMS, seizure 09/11/19 -Developed acute altered mental status day after Cycle 6 chemo (09/11/19).  -He was admitted for work up, EEG showed epileptic spike. ID work up negative, MRI negative for metastasis.  -Followed by Dr. Delice Lesch  4. Mild cognitive impairment, Social support -followed by [employer] [facility] -He is a reliable historian, independent with ADLs,  a/o x4, no signs of obvious impairment -Will monitor for neurotoxicities on chemo, especially oxaliplatin -He has very good family support from his 2 sisters.  -He currently has been able to go to work everyday since being on chemo. -Pt's sister is very concerned about his long working hour (pt would like to increase). After lengthy discussion, patient agreed to stay on 40 hours a week.    PLAN: -will proceed with first chemoRT today  -lab weekly, -f/u next week, then every 2 weeks    No problem-specific Assessment & Plan notes found for this encounter.   No orders of the defined types were placed in this encounter.  All questions were answered. The patient knows to call the clinic with  any problems, questions or concerns. No barriers to learning was detected. The total time spent in the appointment was 25 minutes.     Truitt Merle, MD 11/02/2019   I, Joslyn Devon, am acting as scribe for Truitt Merle, MD.   I have reviewed the above documentation for accuracy and completeness, and I agree with the above.

## 2019-11-02 ENCOUNTER — Inpatient Hospital Stay (HOSPITAL_BASED_OUTPATIENT_CLINIC_OR_DEPARTMENT_OTHER): Payer: 59 | Admitting: Hematology

## 2019-11-02 ENCOUNTER — Ambulatory Visit
Admission: RE | Admit: 2019-11-02 | Discharge: 2019-11-02 | Disposition: A | Discharge status: Home / Self Care | Payer: 59 | Source: Ambulatory Visit | Attending: Radiation Oncology | Admitting: Radiation Oncology

## 2019-11-02 ENCOUNTER — Other Ambulatory Visit: Payer: Self-pay

## 2019-11-02 ENCOUNTER — Inpatient Hospital Stay: Payer: 59

## 2019-11-02 ENCOUNTER — Encounter: Payer: Self-pay | Admitting: Hematology

## 2019-11-02 VITALS — BP 146/82 | HR 98 | Temp 97.8°F | Resp 16 | Ht 67.0 in | Wt 136.7 lb

## 2019-11-02 DIAGNOSIS — C20 Malignant neoplasm of rectum: Secondary | ICD-10-CM

## 2019-11-02 DIAGNOSIS — Z510 Encounter for antineoplastic radiation therapy: Secondary | ICD-10-CM | POA: Diagnosis not present

## 2019-11-02 LAB — CBC WITH DIFFERENTIAL (CANCER CENTER ONLY)
Abs Immature Granulocytes: 0.02 10*3/uL (ref 0.00–0.07)
Basophils Absolute: 0 10*3/uL (ref 0.0–0.1)
Basophils Relative: 0 %
Eosinophils Absolute: 0.1 10*3/uL (ref 0.0–0.5)
Eosinophils Relative: 2 %
HCT: 36.5 % — ABNORMAL LOW (ref 39.0–52.0)
Hemoglobin: 12.2 g/dL — ABNORMAL LOW (ref 13.0–17.0)
Immature Granulocytes: 0 %
Lymphocytes Relative: 44 %
Lymphs Abs: 2.4 10*3/uL (ref 0.7–4.0)
MCH: 29.2 pg (ref 26.0–34.0)
MCHC: 33.4 g/dL (ref 30.0–36.0)
MCV: 87.3 fL (ref 80.0–100.0)
Monocytes Absolute: 0.7 10*3/uL (ref 0.1–1.0)
Monocytes Relative: 13 %
Neutro Abs: 2.3 10*3/uL (ref 1.7–7.7)
Neutrophils Relative %: 41 %
Platelet Count: 193 10*3/uL (ref 150–400)
RBC: 4.18 MIL/uL — ABNORMAL LOW (ref 4.22–5.81)
RDW: 14.7 % (ref 11.5–15.5)
WBC Count: 5.6 10*3/uL (ref 4.0–10.5)
nRBC: 0 % (ref 0.0–0.2)

## 2019-11-02 LAB — CMP (CANCER CENTER ONLY)
ALT: 18 U/L (ref 0–44)
AST: 26 U/L (ref 15–41)
Albumin: 3.9 g/dL (ref 3.5–5.0)
Alkaline Phosphatase: 91 U/L (ref 38–126)
Anion gap: 6 (ref 5–15)
BUN: 11 mg/dL (ref 8–23)
CO2: 28 mmol/L (ref 22–32)
Calcium: 9.4 mg/dL (ref 8.9–10.3)
Chloride: 106 mmol/L (ref 98–111)
Creatinine: 0.8 mg/dL (ref 0.61–1.24)
GFR, Est AFR Am: >60 mL/min (ref >60)
GFR, Estimated: >60 mL/min (ref >60)
Glucose, Bld: 113 mg/dL — ABNORMAL HIGH (ref 70–99)
Potassium: 4.3 mmol/L (ref 3.5–5.1)
Sodium: 140 mmol/L (ref 135–145)
Total Bilirubin: 0.5 mg/dL (ref 0.3–1.2)
Total Protein: 7.1 g/dL (ref 6.5–8.1)

## 2019-11-02 LAB — CEA (IN HOUSE-CHCC): CEA (CHCC-In House): 1.33 ng/mL (ref 0.00–5.00)

## 2019-11-02 NOTE — Patient Instructions (Signed)

## 2019-11-03 ENCOUNTER — Ambulatory Visit
Admission: RE | Admit: 2019-11-03 | Discharge: 2019-11-03 | Disposition: A | Discharge status: Home / Self Care | Payer: 59 | Source: Ambulatory Visit | Attending: Radiation Oncology | Admitting: Radiation Oncology

## 2019-11-03 ENCOUNTER — Telehealth: Payer: Self-pay | Admitting: Hematology

## 2019-11-03 ENCOUNTER — Other Ambulatory Visit: Payer: Self-pay

## 2019-11-03 DIAGNOSIS — Z510 Encounter for antineoplastic radiation therapy: Secondary | ICD-10-CM | POA: Diagnosis not present

## 2019-11-03 NOTE — Telephone Encounter (Signed)
Scheduled per 9/27 los. Pt is aware of appt times and date.

## 2019-11-04 ENCOUNTER — Other Ambulatory Visit: Payer: Self-pay

## 2019-11-04 ENCOUNTER — Ambulatory Visit
Admission: RE | Admit: 2019-11-04 | Discharge: 2019-11-04 | Disposition: A | Discharge status: Home / Self Care | Payer: 59 | Source: Ambulatory Visit | Attending: Radiation Oncology | Admitting: Radiation Oncology

## 2019-11-04 DIAGNOSIS — Z510 Encounter for antineoplastic radiation therapy: Secondary | ICD-10-CM | POA: Diagnosis not present

## 2019-11-05 ENCOUNTER — Other Ambulatory Visit: Payer: 59

## 2019-11-05 ENCOUNTER — Ambulatory Visit: Payer: 59 | Admitting: Nurse Practitioner

## 2019-11-05 ENCOUNTER — Ambulatory Visit
Admission: RE | Admit: 2019-11-05 | Discharge: 2019-11-05 | Disposition: A | Discharge status: Home / Self Care | Payer: 59 | Source: Ambulatory Visit | Attending: Radiation Oncology | Admitting: Radiation Oncology

## 2019-11-05 DIAGNOSIS — Z510 Encounter for antineoplastic radiation therapy: Secondary | ICD-10-CM | POA: Diagnosis not present

## 2019-11-05 NOTE — Progress Notes (Signed)
Pt here for patient teaching.  Pt given Radiation and You booklet.  Reviewed areas of pertinence such as diarrhea, fatigue, hair loss, sexual and fertility changes, skin changes and urinary and bladder changes . Pt able to give teach back of to pat skin, use unscented/gentle soap, use baby wipes, have Imodium on hand, drink plenty of water and sitz bath,avoid applying anything to skin within 4 hours of treatment. Pt verbalizes understanding of information given and will contact nursing with any questions or concerns.     August Longest M. Khylee Algeo RN, BSN     

## 2019-11-06 ENCOUNTER — Ambulatory Visit
Admission: RE | Admit: 2019-11-06 | Discharge: 2019-11-06 | Disposition: A | Discharge status: Home / Self Care | Payer: 59 | Source: Ambulatory Visit | Attending: Radiation Oncology | Admitting: Radiation Oncology

## 2019-11-06 ENCOUNTER — Other Ambulatory Visit: Payer: Self-pay

## 2019-11-06 DIAGNOSIS — C20 Malignant neoplasm of rectum: Secondary | ICD-10-CM | POA: Insufficient documentation

## 2019-11-06 DIAGNOSIS — G893 Neoplasm related pain (acute) (chronic): Secondary | ICD-10-CM | POA: Diagnosis not present

## 2019-11-06 DIAGNOSIS — Z9221 Personal history of antineoplastic chemotherapy: Secondary | ICD-10-CM | POA: Diagnosis not present

## 2019-11-06 DIAGNOSIS — Z510 Encounter for antineoplastic radiation therapy: Secondary | ICD-10-CM | POA: Insufficient documentation

## 2019-11-06 NOTE — Progress Notes (Signed)
[place]   [phone] [phone]   Clinic Follow up Note   Patient Care Team: [name], MD as PCP - General (Family Medicine) Cameron Sprang, MD as Consulting Physician (Neurology) Jonnie Finner, RN as Oncology Nurse Navigator Truitt Merle, MD as Consulting Physician (Hematology) Ronnette Juniper, MD as Consulting Physician (Gastroenterology)  Date of Service:  11/09/2019  CHIEF COMPLAINT: F/u rectal cancer  SUMMARY OF ONCOLOGIC HISTORY: Oncology History Overview Note  Cancer Staging Rectal adenocarcinoma [facility]) Staging form: Colon and Rectum, AJCC 8th Edition - Clinical stage from 06/12/2019: cT4, cN1 - Unsigned    Rectal adenocarcinoma ([place])  05/29/2019 Procedure   Colonoscopy per Dr. Therisa Doyne A digital rectal exam was normal  frond-like fungating infiltrative non-obstructing large mass in the rectum. The mass was partially circumferential, measured 5 cm in length. The colonoscopy also showed 11 sessile polyps in the transverse and asecnding colon and hepatic flexure.    05/29/2019 Initial Biopsy   Pathology on the rectal biopsy showed at least intramucosal adenocarcinoma involving tubulovillous adenoma with high grade dysplasia.   06/05/2019 Imaging   CT CAP IMPRESSION: 1. Cholelithiasis and gallbladder wall thickening with equivocal pericholecystic inflammation. Acute cholecystitis not excluded. If there is clinical suspicion for acute cholecystitis, recommend ultrasound evaluation. 2. Approximately 5 cm irregular mass within the distal sigmoid colon with possible extension through the wall. No definite abnormal adjacent lymph nodes. 3. Mild omental haziness-nonspecific. Metastatic disease not excluded. 4. Indeterminate 3-4 mm RIGHT pulmonary nodules which could represent metastatic disease. These are too small for PET CT evaluation. Consider CT follow-up. 5. 2.5 cm irregular cystic mass within the RIGHT kidney. Elective MRI  recommended for further evaluation. 6. 4 mm nonobstructing LEFT LOWER pole renal calculus. 7. Coronary artery disease. 8. Aortic Atherosclerosis (ICD10-I70.0).   06/12/2019 Imaging   Staging MRI pelvis  FINDINGS: TUMOR LOCATION Tumor distance from Anal Verge/Skin Surface:  11.4 cm Tumor distance to Internal Anal Sphincter: 6.5 cm TUMOR DESCRIPTION Circumferential Extent: 100% Tumor Length: 8.3 cm T - CATEGORY Extension through Muscularis Propria: Yes>73mm=T3d Shortest Distance of any tumor/node from Mesorectal Fascia: 0 mm, along the left lateral and posterior walls (tumor abuts the left lateral pelvic sidewall and sacrum) Extramural Vascular Invasion/Tumor Thrombus: No Invasion of Anterior Peritoneal Reflection: No Involvement of Adjacent Organs or Pelvic Sidewall: Yes, involving the left lateral pelvic sidewall =T4 Levator Ani Involvement: No N - CATEGORY Mesorectal Lymph Nodes >=30mm: 2=N1 Extra-mesorectal Lymphadenopathy: No Other:  None. IMPRESSION: Rectal adenocarcinoma T stage: T4 Rectal adenocarcinoma N stage:  N1 Distance from tumor to the internal anal sphincter is 6.5 cm.   06/18/2019 Initial Diagnosis   Rectal adenocarcinoma ([place])   07/02/2019 - 10/08/2019 Chemotherapy   Total Neoadjuvant FOLFOX q2weeks starting 07/02/19-10/08/19,  8 cycles   07/13/2019 PET scan   IMPRESSION: 1. Known rectal mass is intensely hypermetabolic compatible with primary rectal adenocarcinoma. No findings of FDG avid nodal metastasis or distant metastatic disease. 2. Hyperdense kidney cysts are identified bilaterally favored to represent hemorrhagic cyst. 3. Nonobstructing left renal calculi. 4. Aortic atherosclerosis, in addition to lad coronary artery disease. Please note that although the presence of coronary artery calcium documents the presence of coronary artery disease, the severity of this disease and any potential stenosis cannot be assessed on this non-gated CT examination.  Assessment for potential risk factor modification, dietary therapy or pharmacologic therapy may be warranted, if clinically indicated. Aortic Atherosclerosis (ICD10-I70.0). 5. Gallstones.   09/11/2019 - 09/14/2019 Hospital Admission   Acute  AMS, seizure 09/11/19 -Developed acute altered mental status day after Cycle 6 chemo (09/11/19).  -He was admitted for work up, EEG showed epileptic spike. ID work up negative, MRI negative for metastasis.    09/11/2019 Imaging   MRI Brain  IMPRESSION: Incomplete study. Images obtained reveal no acute abnormality. Negative for acute infarct.   Atrophy and mild ventricular enlargement stable from prior studies.   09/11/2019 Imaging   CT head  IMPRESSION: Mild ventricular prominence with normal appearing sulci. Question early communicating hydrocephalus. Brain parenchyma appears unremarkable. No acute infarct. No mass or hemorrhage.   Foci of arterial vascular calcification noted. There is mucosal thickening in several ethmoid air cells.   There is probable cerumen in the right external auditory canal.     09/23/2019 Imaging   CT AP  IMPRESSION: 1. Solid-appearing left eccentric upper rectal mass measures about 6.7 by 2.9 cm. This is difficult to compare directly to the prior exam given differences in orientation and degree of distension of the rectum. There is some prominence of stool in the sigmoid colon and rectum on today's examination, but the mass is not obstructive. 2. Other imaging findings of potential clinical significance: Cholelithiasis. Complex bilateral renal lesions are similar to prior exams. Nonobstructive left nephrolithiasis. Mild prostatomegaly with nodular prominence the upper margin of the prostate gland. Lumbar spondylosis and degenerative disc disease causing multilevel foraminal impingement. 3. Aortic atherosclerosis.   Aortic Atherosclerosis (ICD10-I70.0).   11/02/2019 -  Chemotherapy   Concurrent chemo RT with Oral  Xeloda 1500mg  BID on days of RT starting 11/02/19   11/02/2019 -  Radiation Therapy   Concurrent chemo RT by Dr Lisbeth Renshaw with Xeloda  starting 11/02/19      CURRENT THERAPY:  Concurrent chemo RT with Oral Xeloda 1500mg  BID starting 11/02/19  INTERVAL HISTORY:  KWASI JOUNG is here for a follow up and treatment. He presents to the clinic with his sister. He notes radiation is going well. He is also taking Xeloda 1500mg  BID. He denies any major issues. He notes having rectal skin irration. He has been using salve. He does not have any severe pain from this. His sister wonders can his anusol suppository at a Journal[employer], newspaper. He plans to have dental visit during RT and Eye exam after CCCRT is done. He notes he continues to have frequent urination. He note she does drinks more water before his Radiation. His urine output fluctuates and denies dysuria or hematuria.     REVIEW OF SYSTEMS:   Constitutional: Denies fevers, chills or abnormal weight loss Eyes: Denies blurriness of vision Ears, nose, mouth, throat, and face: Denies mucositis or sore throat Respiratory: Denies cough, dyspnea or wheezes Cardiovascular: Denies palpitation, chest discomfort or lower extremity swelling Gastrointestinal:  Denies nausea, heartburn or change in bowel habits (+) rectal skin irritation Skin: Denies abnormal skin rashes Lymphatics: Denies new lymphadenopathy or easy bruising Neurological:Denies numbness, tingling or new weaknesses Behavioral/Psych: Mood is stable, no new changes  All other systems were reviewed with the patient and are negative.  MEDICAL HISTORY:  Past Medical History:  Diagnosis Date  . Anxiety   . Benign prostatic hyperplasia 03/30/2013   10/1 IMO update  . Cancer ([place])   . 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 PORTACATH PLACEMENT N/A 07/01/2019   Procedure: PORT ULTRASOUND GUIDED  PLACEMENT;  Surgeon: Alphonsa Overall, MD;  Location: [facility];  Service: General;  Laterality: N/A;  .  PROSTATE SURGERY  2004  . TONSILECTOMY, ADENOIDECTOMY, BILATERAL MYRINGOTOMY AND TUBES      I have reviewed the social history and family history with the patient and they are unchanged from previous note.  ALLERGIES:  has No Known Allergies.  MEDICATIONS:  Current Outpatient Medications  Medication Sig Dispense Refill  . capecitabine (XELODA) 500 MG tablet Take 3 tablets (1,500 mg total) by mouth 2 (two) times daily after a meal. Take on days of radiation, Monday through Friday 90 tablet 1  . HYDROcodone-Acetaminophen 5-300 MG TABS Take 1 tablet by mouth every 8 (eight) hours as needed (for severe pain as needed). 20 tablet 0  . levETIRAcetam (KEPPRA) 1000 MG tablet Take 1 tablet (1,000 mg total) by mouth 2 (two) times daily. 60 tablet 11  . Lidocaine, Anorectal, 50 MG SUPP Place 50 mg rectally every 8 (eight) hours as needed. 14 suppository 2  . traMADol (ULTRAM) 50 MG tablet Take 1 tablet (50 mg total) by mouth every 6 (six) hours as needed for moderate pain. 30 tablet 0   No current facility-administered medications for this visit.    PHYSICAL EXAMINATION: ECOG PERFORMANCE STATUS: 1 - Symptomatic but completely ambulatory  Vitals:   11/09/19 1002  BP: (!) 137/91  Pulse: 82  Resp: 16  Temp: 98 F (36.7 C)  SpO2: 100%   Filed Weights   11/09/19 1002  Weight: 133 lb 14.4 oz (60.7 kg)    Due to COVID19 we will limit examination to appearance. Patient had no complaints.  GENERAL:alert, no distress and comfortable SKIN: skin color normal, no rashes or significant lesions EYES: normal, Conjunctiva are pink and non-injected, sclera clear  NEURO: alert & oriented x 3 with fluent speech    LABORATORY DATA:  I have reviewed the data as listed CBC Latest Ref Rng & Units 11/09/2019 11/02/2019 10/08/2019  WBC 4.0 - 10.5 K/uL 4.1 5.6 5.2  Hemoglobin 13.0 - 17.0 g/dL 11.7(L) 12.2(L)  12.1(L)  Hematocrit 39 - 52 % 34.7(L) 36.5(L) 36.1(L)  Platelets 150 - 400 K/uL 187 193 183     CMP Latest Ref Rng & Units 11/09/2019 11/02/2019 10/08/2019  Glucose 70 - 99 mg/dL 116(H) 113(H) 136(H)  BUN 8 - 23 mg/dL 13 11 11   Creatinine 0.61 - 1.24 mg/dL 0.78 0.80 0.80  Sodium 135 - 145 mmol/L 138 140 141  Potassium 3.5 - 5.1 mmol/L 4.1 4.3 3.7  Chloride 98 - 111 mmol/L 105 106 105  CO2 22 - 32 mmol/L 28 28 27   Calcium 8.9 - 10.3 mg/dL 9.5 9.4 10.1  Total Protein 6.5 - 8.1 g/dL 6.7 7.1 7.1  Total Bilirubin 0.3 - 1.2 mg/dL 0.7 0.5 0.3  Alkaline Phos 38 - 126 U/L 82 91 86  AST 15 - 41 U/L 23 26 27   ALT 0 - 44 U/L 17 18 18       RADIOGRAPHIC STUDIES: I have personally reviewed the radiological images as listed and agreed with the findings in the report. No results found.   ASSESSMENT & PLAN:  YOUCEF KLAS is a 74 y.o. male with    1. Adenocarcinoma of the rectum, cT4N1M0 stage III -He was recently diagnosed in 4/2021with locally advanced stage III rectal cancer.  -He recently completed neoadjuvant chemotherapy with 8 cycles of FOLFOX 07/02/19-10/08/19. Plan to start concurrent chemoRT with Xeloda 1500mg  BID for 5-6 weeks on 11/02/19. Then he will proceed with surgeryafter chemoRT. The treatment goal is curative. -S/p week 1 CCRT he has tolerated well with mild  rectal skin irritation. Weight remains stable.  -Labs reviewed, CBC and CMP WNL except Hg 11.7, BG 116. Overall adequate to continue Xeloda 1500mg  BID on days of RT.  -F/u with NP Lacie in 2 weeks and F/u with me in 4 weeks.  -continue lab weekly   2. Rectal Pain, secondary to #1 -onset in mid August.  -Previously on Tramadol, now on Hydrocodone. Once he finds a compounding pharmacy I will call in his lidocaine/anusol suppositories. If he cannot find this anusol he can use other topical OTC options.  -stable   3. Acute AMS, seizure 09/11/19 -Developed acute altered mental status day after Cycle 6  chemo(09/11/19). -Hewas admitted for work up, EEG showed epileptic spike. ID work up negative, MRI negative for metastasis. -Followed by Dr. Delice Lesch  4. Mild cognitive impairment, Social support -followed by [employer] [facility] -He is a reliable historian, independent with ADLs, a/o x4, no signs of obvious impairment -Will monitor for neurotoxicities on chemo, especially oxaliplatin -He has very good family support from his 2 sisters.  -He currently has been able to go to work everyday since being on chemo. -Pt's sister is very concerned about his long working hour (pt would like to increase).After lengthy discussion, patient agreed to stay on 40 hours a week.    PLAN: -Continue Radiation  -Continue Xeloda 1500mg  BID on days of RT -Lab and F/u with NP Lacie in 2 weeks    No problem-specific Assessment & Plan notes found for this encounter.   No orders of the defined types were placed in this encounter.  All questions were answered. The patient knows to call the clinic with any problems, questions or concerns. No barriers to learning was detected. The total time spent in the appointment was 30 minutes.     Truitt Merle, MD 11/09/2019   I, Joslyn Devon, am acting as scribe for Truitt Merle, MD.   I have reviewed the above documentation for accuracy and completeness, and I agree with the above.

## 2019-11-09 ENCOUNTER — Other Ambulatory Visit: Payer: Self-pay

## 2019-11-09 ENCOUNTER — Inpatient Hospital Stay: Payer: 59

## 2019-11-09 ENCOUNTER — Encounter: Payer: Self-pay | Admitting: Hematology

## 2019-11-09 ENCOUNTER — Inpatient Hospital Stay: Payer: 59 | Attending: Nurse Practitioner

## 2019-11-09 ENCOUNTER — Inpatient Hospital Stay (HOSPITAL_BASED_OUTPATIENT_CLINIC_OR_DEPARTMENT_OTHER): Payer: 59 | Admitting: Hematology

## 2019-11-09 ENCOUNTER — Other Ambulatory Visit: Payer: Self-pay | Admitting: Hematology

## 2019-11-09 ENCOUNTER — Ambulatory Visit
Admission: RE | Admit: 2019-11-09 | Discharge: 2019-11-09 | Disposition: A | Discharge status: Home / Self Care | Payer: 59 | Source: Ambulatory Visit | Attending: Radiation Oncology | Admitting: Radiation Oncology

## 2019-11-09 VITALS — BP 137/91 | HR 82 | Temp 98.0°F | Resp 16 | Ht 67.0 in | Wt 133.9 lb

## 2019-11-09 DIAGNOSIS — C20 Malignant neoplasm of rectum: Secondary | ICD-10-CM

## 2019-11-09 DIAGNOSIS — Z9221 Personal history of antineoplastic chemotherapy: Secondary | ICD-10-CM | POA: Insufficient documentation

## 2019-11-09 DIAGNOSIS — G893 Neoplasm related pain (acute) (chronic): Secondary | ICD-10-CM | POA: Insufficient documentation

## 2019-11-09 DIAGNOSIS — Z95828 Presence of other vascular implants and grafts: Secondary | ICD-10-CM

## 2019-11-09 LAB — CMP (CANCER CENTER ONLY)
ALT: 17 U/L (ref 0–44)
AST: 23 U/L (ref 15–41)
Albumin: 3.7 g/dL (ref 3.5–5.0)
Alkaline Phosphatase: 82 U/L (ref 38–126)
Anion gap: 5 (ref 5–15)
BUN: 13 mg/dL (ref 8–23)
CO2: 28 mmol/L (ref 22–32)
Calcium: 9.5 mg/dL (ref 8.9–10.3)
Chloride: 105 mmol/L (ref 98–111)
Creatinine: 0.78 mg/dL (ref 0.61–1.24)
GFR, Est AFR Am: >60 mL/min (ref >60)
GFR, Estimated: >60 mL/min (ref >60)
Glucose, Bld: 116 mg/dL — ABNORMAL HIGH (ref 70–99)
Potassium: 4.1 mmol/L (ref 3.5–5.1)
Sodium: 138 mmol/L (ref 135–145)
Total Bilirubin: 0.7 mg/dL (ref 0.3–1.2)
Total Protein: 6.7 g/dL (ref 6.5–8.1)

## 2019-11-09 LAB — CBC WITH DIFFERENTIAL (CANCER CENTER ONLY)
Abs Immature Granulocytes: 0.03 10*3/uL (ref 0.00–0.07)
Basophils Absolute: 0 10*3/uL (ref 0.0–0.1)
Basophils Relative: 0 %
Eosinophils Absolute: 0.1 10*3/uL (ref 0.0–0.5)
Eosinophils Relative: 2 %
HCT: 34.7 % — ABNORMAL LOW (ref 39.0–52.0)
Hemoglobin: 11.7 g/dL — ABNORMAL LOW (ref 13.0–17.0)
Immature Granulocytes: 1 %
Lymphocytes Relative: 42 %
Lymphs Abs: 1.7 10*3/uL (ref 0.7–4.0)
MCH: 29.9 pg (ref 26.0–34.0)
MCHC: 33.7 g/dL (ref 30.0–36.0)
MCV: 88.7 fL (ref 80.0–100.0)
Monocytes Absolute: 0.5 10*3/uL (ref 0.1–1.0)
Monocytes Relative: 12 %
Neutro Abs: 1.8 10*3/uL (ref 1.7–7.7)
Neutrophils Relative %: 43 %
Platelet Count: 187 10*3/uL (ref 150–400)
RBC: 3.91 MIL/uL — ABNORMAL LOW (ref 4.22–5.81)
RDW: 14.5 % (ref 11.5–15.5)
WBC Count: 4.1 10*3/uL (ref 4.0–10.5)
nRBC: 0 % (ref 0.0–0.2)

## 2019-11-09 MED ORDER — SODIUM CHLORIDE 0.9% FLUSH
10.0000 mL | Freq: Once | INTRAVENOUS | Status: AC
  Administered 2019-11-09: 10 mL
  Filled 2019-11-09: qty 10

## 2019-11-09 MED ORDER — HEPARIN SOD (PORK) LOCK FLUSH 100 UNIT/ML IV SOLN
500.0000 [IU] | Freq: Once | INTRAVENOUS | Status: AC
  Administered 2019-11-09: 500 [IU]
  Filled 2019-11-09: qty 5

## 2019-11-10 ENCOUNTER — Ambulatory Visit
Admission: RE | Admit: 2019-11-10 | Discharge: 2019-11-10 | Disposition: A | Discharge status: Home / Self Care | Payer: 59 | Source: Ambulatory Visit | Attending: Radiation Oncology | Admitting: Radiation Oncology

## 2019-11-10 DIAGNOSIS — C20 Malignant neoplasm of rectum: Secondary | ICD-10-CM | POA: Diagnosis not present

## 2019-11-10 NOTE — Telephone Encounter (Signed)
Refill request

## 2019-11-11 ENCOUNTER — Ambulatory Visit
Admission: RE | Admit: 2019-11-11 | Discharge: 2019-11-11 | Disposition: A | Discharge status: Home / Self Care | Payer: 59 | Source: Ambulatory Visit | Attending: Radiation Oncology | Admitting: Radiation Oncology

## 2019-11-11 ENCOUNTER — Telehealth: Payer: Self-pay | Admitting: Hematology

## 2019-11-11 DIAGNOSIS — C20 Malignant neoplasm of rectum: Secondary | ICD-10-CM | POA: Diagnosis not present

## 2019-11-11 NOTE — Telephone Encounter (Signed)
No 10/4 los °

## 2019-11-12 ENCOUNTER — Ambulatory Visit
Admission: RE | Admit: 2019-11-12 | Discharge: 2019-11-12 | Disposition: A | Discharge status: Home / Self Care | Payer: 59 | Source: Ambulatory Visit | Attending: Radiation Oncology | Admitting: Radiation Oncology

## 2019-11-12 DIAGNOSIS — C20 Malignant neoplasm of rectum: Secondary | ICD-10-CM | POA: Diagnosis not present

## 2019-11-13 ENCOUNTER — Ambulatory Visit
Admission: RE | Admit: 2019-11-13 | Discharge: 2019-11-13 | Disposition: A | Discharge status: Home / Self Care | Payer: 59 | Source: Ambulatory Visit | Attending: Radiation Oncology | Admitting: Radiation Oncology

## 2019-11-13 DIAGNOSIS — C20 Malignant neoplasm of rectum: Secondary | ICD-10-CM | POA: Diagnosis not present

## 2019-11-16 ENCOUNTER — Ambulatory Visit
Admission: RE | Admit: 2019-11-16 | Discharge: 2019-11-16 | Disposition: A | Discharge status: Home / Self Care | Payer: 59 | Source: Ambulatory Visit | Attending: Radiation Oncology | Admitting: Radiation Oncology

## 2019-11-16 ENCOUNTER — Inpatient Hospital Stay: Payer: 59

## 2019-11-16 ENCOUNTER — Other Ambulatory Visit: Payer: Self-pay

## 2019-11-16 DIAGNOSIS — Z95828 Presence of other vascular implants and grafts: Secondary | ICD-10-CM

## 2019-11-16 DIAGNOSIS — C20 Malignant neoplasm of rectum: Secondary | ICD-10-CM

## 2019-11-16 LAB — CMP (CANCER CENTER ONLY)
ALT: 85 U/L — ABNORMAL HIGH (ref 0–44)
AST: 43 U/L — ABNORMAL HIGH (ref 15–41)
Albumin: 3.6 g/dL (ref 3.5–5.0)
Alkaline Phosphatase: 98 U/L (ref 38–126)
Anion gap: 4 — ABNORMAL LOW (ref 5–15)
BUN: 9 mg/dL (ref 8–23)
CO2: 29 mmol/L (ref 22–32)
Calcium: 9.4 mg/dL (ref 8.9–10.3)
Chloride: 106 mmol/L (ref 98–111)
Creatinine: 0.74 mg/dL (ref 0.61–1.24)
GFR, Estimated: >60 mL/min (ref >60)
Glucose, Bld: 107 mg/dL — ABNORMAL HIGH (ref 70–99)
Potassium: 4.1 mmol/L (ref 3.5–5.1)
Sodium: 139 mmol/L (ref 135–145)
Total Bilirubin: 0.5 mg/dL (ref 0.3–1.2)
Total Protein: 6.7 g/dL (ref 6.5–8.1)

## 2019-11-16 LAB — CBC WITH DIFFERENTIAL (CANCER CENTER ONLY)
Abs Immature Granulocytes: 0.03 10*3/uL (ref 0.00–0.07)
Basophils Absolute: 0 10*3/uL (ref 0.0–0.1)
Basophils Relative: 0 %
Eosinophils Absolute: 0.2 10*3/uL (ref 0.0–0.5)
Eosinophils Relative: 5 %
HCT: 35 % — ABNORMAL LOW (ref 39.0–52.0)
Hemoglobin: 11.8 g/dL — ABNORMAL LOW (ref 13.0–17.0)
Immature Granulocytes: 1 %
Lymphocytes Relative: 25 %
Lymphs Abs: 1.2 10*3/uL (ref 0.7–4.0)
MCH: 30.1 pg (ref 26.0–34.0)
MCHC: 33.7 g/dL (ref 30.0–36.0)
MCV: 89.3 fL (ref 80.0–100.0)
Monocytes Absolute: 0.6 10*3/uL (ref 0.1–1.0)
Monocytes Relative: 12 %
Neutro Abs: 2.8 10*3/uL (ref 1.7–7.7)
Neutrophils Relative %: 57 %
Platelet Count: 151 10*3/uL (ref 150–400)
RBC: 3.92 MIL/uL — ABNORMAL LOW (ref 4.22–5.81)
RDW: 15.3 % (ref 11.5–15.5)
WBC Count: 4.8 10*3/uL (ref 4.0–10.5)
nRBC: 0 % (ref 0.0–0.2)

## 2019-11-16 MED ORDER — SODIUM CHLORIDE 0.9% FLUSH
10.0000 mL | Freq: Once | INTRAVENOUS | Status: AC
  Administered 2019-11-16: 10 mL
  Filled 2019-11-16: qty 10

## 2019-11-16 MED ORDER — HEPARIN SOD (PORK) LOCK FLUSH 100 UNIT/ML IV SOLN
500.0000 [IU] | Freq: Once | INTRAVENOUS | Status: AC
  Administered 2019-11-16: 500 [IU]
  Filled 2019-11-16: qty 5

## 2019-11-17 ENCOUNTER — Ambulatory Visit
Admission: RE | Admit: 2019-11-17 | Discharge: 2019-11-17 | Disposition: A | Discharge status: Home / Self Care | Payer: 59 | Source: Ambulatory Visit | Attending: Radiation Oncology | Admitting: Radiation Oncology

## 2019-11-17 DIAGNOSIS — C20 Malignant neoplasm of rectum: Secondary | ICD-10-CM | POA: Diagnosis not present

## 2019-11-18 ENCOUNTER — Other Ambulatory Visit: Payer: Self-pay | Admitting: Hematology

## 2019-11-18 ENCOUNTER — Ambulatory Visit
Admission: RE | Admit: 2019-11-18 | Discharge: 2019-11-18 | Disposition: A | Discharge status: Home / Self Care | Payer: 59 | Source: Ambulatory Visit | Attending: Radiation Oncology | Admitting: Radiation Oncology

## 2019-11-18 DIAGNOSIS — C20 Malignant neoplasm of rectum: Secondary | ICD-10-CM | POA: Diagnosis not present

## 2019-11-18 MED ORDER — CAPECITABINE 500 MG PO TABS: 825.0000 mg/m2 | ORAL_TABLET | Freq: Two times a day (BID) | ORAL | 0 refills | Status: DC

## 2019-11-19 ENCOUNTER — Telehealth: Payer: Self-pay

## 2019-11-19 ENCOUNTER — Ambulatory Visit
Admission: RE | Admit: 2019-11-19 | Discharge: 2019-11-19 | Disposition: A | Discharge status: Home / Self Care | Payer: 59 | Source: Ambulatory Visit | Attending: Radiation Oncology | Admitting: Radiation Oncology

## 2019-11-19 DIAGNOSIS — C20 Malignant neoplasm of rectum: Secondary | ICD-10-CM | POA: Diagnosis not present

## 2019-11-19 NOTE — Telephone Encounter (Signed)
Ms Volanda Napoleon left vm asking if it was ok for Mr Balthazor to have dental procedures after radiation.  I reviewed with Dr. Burr Medico.  He is to wait until after radiation is complete.  He will see Dr Burr Medico with labs several days before the finish of rad therapy.  I left a vm for Ms Volanda Napoleon with this information.  I encouraged her to call with any questions.

## 2019-11-20 ENCOUNTER — Ambulatory Visit
Admission: RE | Admit: 2019-11-20 | Discharge: 2019-11-20 | Disposition: A | Discharge status: Home / Self Care | Payer: 59 | Source: Ambulatory Visit | Attending: Radiation Oncology | Admitting: Radiation Oncology

## 2019-11-20 DIAGNOSIS — C20 Malignant neoplasm of rectum: Secondary | ICD-10-CM | POA: Diagnosis not present

## 2019-11-22 ENCOUNTER — Other Ambulatory Visit: Payer: Self-pay | Admitting: Hematology

## 2019-11-22 NOTE — Progress Notes (Signed)
[place]   [phone] [phone]   Clinic Follow up Note   Patient Care Team: [name], MD as PCP - General (Family Medicine) Cameron Sprang, MD as Consulting Physician (Neurology) Jonnie Finner, RN as Oncology Nurse Navigator Truitt Merle, MD as Consulting Physician (Hematology) Ronnette Juniper, MD as Consulting Physician (Gastroenterology) 11/23/2019  CHIEF COMPLAINT: F/u rectal cancer   SUMMARY OF ONCOLOGIC HISTORY: Oncology History Overview Note  Cancer Staging Rectal adenocarcinoma [facility]) Staging form: Colon and Rectum, AJCC 8th Edition - Clinical stage from 06/12/2019: cT4, cN1 - Unsigned    Rectal adenocarcinoma ([place])  05/29/2019 Procedure   Colonoscopy per Dr. Therisa Doyne A digital rectal exam was normal  frond-like fungating infiltrative non-obstructing large mass in the rectum. The mass was partially circumferential, measured 5 cm in length. The colonoscopy also showed 11 sessile polyps in the transverse and asecnding colon and hepatic flexure.    05/29/2019 Initial Biopsy   Pathology on the rectal biopsy showed at least intramucosal adenocarcinoma involving tubulovillous adenoma with high grade dysplasia.   06/05/2019 Imaging   CT CAP IMPRESSION: 1. Cholelithiasis and gallbladder wall thickening with equivocal pericholecystic inflammation. Acute cholecystitis not excluded. If there is clinical suspicion for acute cholecystitis, recommend ultrasound evaluation. 2. Approximately 5 cm irregular mass within the distal sigmoid colon with possible extension through the wall. No definite abnormal adjacent lymph nodes. 3. Mild omental haziness-nonspecific. Metastatic disease not excluded. 4. Indeterminate 3-4 mm RIGHT pulmonary nodules which could represent metastatic disease. These are too small for PET CT evaluation. Consider CT follow-up. 5. 2.5 cm irregular cystic mass within the RIGHT kidney. Elective MRI recommended for further  evaluation. 6. 4 mm nonobstructing LEFT LOWER pole renal calculus. 7. Coronary artery disease. 8. Aortic Atherosclerosis (ICD10-I70.0).   06/12/2019 Imaging   Staging MRI pelvis  FINDINGS: TUMOR LOCATION Tumor distance from Anal Verge/Skin Surface:  11.4 cm Tumor distance to Internal Anal Sphincter: 6.5 cm TUMOR DESCRIPTION Circumferential Extent: 100% Tumor Length: 8.3 cm T - CATEGORY Extension through Muscularis Propria: Yes>5mm=T3d Shortest Distance of any tumor/node from Mesorectal Fascia: 0 mm, along the left lateral and posterior walls (tumor abuts the left lateral pelvic sidewall and sacrum) Extramural Vascular Invasion/Tumor Thrombus: No Invasion of Anterior Peritoneal Reflection: No Involvement of Adjacent Organs or Pelvic Sidewall: Yes, involving the left lateral pelvic sidewall =T4 Levator Ani Involvement: No N - CATEGORY Mesorectal Lymph Nodes >=60mm: 2=N1 Extra-mesorectal Lymphadenopathy: No Other:  None. IMPRESSION: Rectal adenocarcinoma T stage: T4 Rectal adenocarcinoma N stage:  N1 Distance from tumor to the internal anal sphincter is 6.5 cm.   06/18/2019 Initial Diagnosis   Rectal adenocarcinoma ([place])   07/02/2019 - 10/08/2019 Chemotherapy   Total Neoadjuvant FOLFOX q2weeks starting 07/02/19-10/08/19,  8 cycles   07/13/2019 PET scan   IMPRESSION: 1. Known rectal mass is intensely hypermetabolic compatible with primary rectal adenocarcinoma. No findings of FDG avid nodal metastasis or distant metastatic disease. 2. Hyperdense kidney cysts are identified bilaterally favored to represent hemorrhagic cyst. 3. Nonobstructing left renal calculi. 4. Aortic atherosclerosis, in addition to lad coronary artery disease. Please note that although the presence of coronary artery calcium documents the presence of coronary artery disease, the severity of this disease and any potential stenosis cannot be assessed on this non-gated CT examination. Assessment for  potential risk factor modification, dietary therapy or pharmacologic therapy may be warranted, if clinically indicated. Aortic Atherosclerosis (ICD10-I70.0). 5. Gallstones.   09/11/2019 - 09/14/2019 Hospital Admission   Acute AMS, seizure 09/11/19 -Developed  acute altered mental status day after Cycle 6 chemo (09/11/19).  -He was admitted for work up, EEG showed epileptic spike. ID work up negative, MRI negative for metastasis.    09/11/2019 Imaging   MRI Brain  IMPRESSION: Incomplete study. Images obtained reveal no acute abnormality. Negative for acute infarct.   Atrophy and mild ventricular enlargement stable from prior studies.   09/11/2019 Imaging   CT head  IMPRESSION: Mild ventricular prominence with normal appearing sulci. Question early communicating hydrocephalus. Brain parenchyma appears unremarkable. No acute infarct. No mass or hemorrhage.   Foci of arterial vascular calcification noted. There is mucosal thickening in several ethmoid air cells.   There is probable cerumen in the right external auditory canal.     09/23/2019 Imaging   CT AP  IMPRESSION: 1. Solid-appearing left eccentric upper rectal mass measures about 6.7 by 2.9 cm. This is difficult to compare directly to the prior exam given differences in orientation and degree of distension of the rectum. There is some prominence of stool in the sigmoid colon and rectum on today's examination, but the mass is not obstructive. 2. Other imaging findings of potential clinical significance: Cholelithiasis. Complex bilateral renal lesions are similar to prior exams. Nonobstructive left nephrolithiasis. Mild prostatomegaly with nodular prominence the upper margin of the prostate gland. Lumbar spondylosis and degenerative disc disease causing multilevel foraminal impingement. 3. Aortic atherosclerosis.   Aortic Atherosclerosis (ICD10-I70.0).   11/02/2019 -  Chemotherapy   Concurrent chemo RT with Oral Xeloda 1500mg  BID  on days of RT starting 11/02/19   11/02/2019 -  Radiation Therapy   Concurrent chemo RT by Dr Lisbeth Renshaw with Xeloda  starting 11/02/19     CURRENT THERAPY: Concurrent chemo RT with Oral Xeloda 1500mg  BID starting 11/02/19  INTERVAL HISTORY: Mr. Ramsay returns for f/u as scheduled. He continues chemoRT with Xeloda as prescribed.  He is having soft bowel movements up to 3/day with occasional "blowouts" exacerbated by hot beverages such as hot tea and coffee which tends to "flush me out."  He has taken Imodium inconsistently which helps, he forgets it at work.  He also uses sits baths and "rubs" on the rectal area.  Denies obvious rectal bleeding or discharge.  He denies any other issues.  Hands remain dry but lighter now.  Denies fever, chills, cough, chest pain, dyspnea, mucositis, nausea/vomiting, neuropathy.    MEDICAL HISTORY:  Past Medical History:  Diagnosis Date  . Anxiety   . Benign prostatic hyperplasia 03/30/2013   10/1 IMO update  . Cancer ([place])   . 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 PORTACATH PLACEMENT N/A 07/01/2019   Procedure: PORT ULTRASOUND GUIDED PLACEMENT;  Surgeon: Alphonsa Overall, MD;  Location: [facility]S;  Service: General;  Laterality: N/A;  . PROSTATE SURGERY  2004  . TONSILECTOMY, ADENOIDECTOMY, BILATERAL MYRINGOTOMY AND TUBES      I have reviewed the social history and family history with the patient and they are unchanged from previous note.  ALLERGIES:  has No Known Allergies.  MEDICATIONS:  Current Outpatient Medications  Medication Sig Dispense Refill  . capecitabine (XELODA) 500 MG tablet Take 3 tablets (1,500 mg total) by mouth 2 (two) times daily after a meal. Take on days of radiation, Monday through Friday 90 tablet 0  . HYDROcodone-Acetaminophen 5-300 MG TABS Take 1 tablet by mouth every 8 (eight) hours as needed (for severe pain as needed). 20 tablet 0  .  levETIRAcetam (KEPPRA) 1000 MG tablet Take 1 tablet (1,000 mg total) by mouth 2 (two) times daily. 60 tablet 11  . Lidocaine, Anorectal, 50 MG SUPP Place 50 mg rectally every 8 (eight) hours as needed. 14 suppository 2  . lidocaine-prilocaine (EMLA) cream Apply topically once.    . traMADol (ULTRAM) 50 MG tablet Take 1 tablet (50 mg total) by mouth every 6 (six) hours as needed for moderate pain. 30 tablet 0  . potassium chloride SA (KLOR-CON) 20 MEQ tablet Take 1 tablet (20 mEq total) by mouth daily. 14 tablet 0   No current facility-administered medications for this visit.    PHYSICAL EXAMINATION: ECOG PERFORMANCE STATUS: 1 - Symptomatic but completely ambulatory  Vitals:   11/23/19 0950  BP: (!) 159/90  Pulse: 85  Resp: 18  Temp: (!) 97 F (36.1 C)  SpO2: 100%   Filed Weights   11/23/19 0950  Weight: 131 lb 1.6 oz (59.5 kg)    GENERAL:alert, no distress and comfortable SKIN: Palms are dry with hyperpigmentation, no obvious rash EYES:  sclera clear LUNGS: normal breathing effort HEART:  no lower extremity edema NEURO: alert & oriented x 3 with fluent speech Rectal/external exam: No significant hyperpigmentation, no erythema, ulceration, peeling, open wound, or skin breakdown  LABORATORY DATA:  I have reviewed the data as listed CBC Latest Ref Rng & Units 11/23/2019 11/16/2019 11/09/2019  WBC 4.0 - 10.5 K/uL 3.5(L) 4.8 4.1  Hemoglobin 13.0 - 17.0 g/dL 12.1(L) 11.8(L) 11.7(L)  Hematocrit 39 - 52 % 35.4(L) 35.0(L) 34.7(L)  Platelets 150 - 400 K/uL 152 151 187     CMP Latest Ref Rng & Units 11/23/2019 11/16/2019 11/09/2019  Glucose 70 - 99 mg/dL 152(H) 107(H) 116(H)  BUN 8 - 23 mg/dL 10 9 13   Creatinine 0.61 - 1.24 mg/dL 0.73 0.74 0.78  Sodium 135 - 145 mmol/L 142 139 138  Potassium 3.5 - 5.1 mmol/L 3.4(L) 4.1 4.1  Chloride 98 - 111 mmol/L 110 106 105  CO2 22 - 32 mmol/L 27 29 28   Calcium 8.9 - 10.3 mg/dL 8.3(L) 9.4 9.5  Total Protein 6.5 - 8.1 g/dL 6.1(L) 6.7 6.7   Total Bilirubin 0.3 - 1.2 mg/dL 0.6 0.5 0.7  Alkaline Phos 38 - 126 U/L 80 98 82  AST 15 - 41 U/L 20 43(H) 23  ALT 0 - 44 U/L 22 85(H) 17      RADIOGRAPHIC STUDIES: I have personally reviewed the radiological images as listed and agreed with the findings in the report. No results found.   ASSESSMENT & PLAN: 74 yo male with   1. Adenocarcinoma of the rectum, cT4N1M0 stage III -Diagnosed in 05/2019 with locally advanced stage III rectal cancer  -Completed neoadjuvant chemo with 8 cycles of FOLFOX from 5/2 7/21-10/08/2019.  He started concurrent chemo RT with Xeloda on 11/02/2019.  The plan will be to proceed with surgery after chemo RT.  The goal is curative -Tolerating expectedly with loose stools and rectal pain  2.  Rectal pain, secondary to #1  -On tramadol and hydrocodone -Worsened with large BM secondary to drinking hot liquids, he knows to avoid certain triggers  3. Acute AMS, seizure 09/11/19 -Developed acute altered mental status day after Cycle 6 chemo while 5FU pump infusing, he pulled the needle out and pump was stopped.  -he was admitted for work up, EEG showed epileptic spike. ID work up negative, MRI negative or metastasis  -? oxali neurotoxicity  -further neuro work up is pending, followed by Dr.  Aquino   4. Mild cognitive impairment -followed by [facility] -He is a reliable historian, independent with ADLs, a/o x4,   Disposition: Mr. Bogden appears stable.  He continues neoadjuvant chemoRT with Xeloda 1500 mg twice daily on M-F with radiation.  He is responding expectantly with soft stools and rectal pain, worsened by larger BM after hot beverages.  We discussed avoiding triggers and symptom management.  He is otherwise tolerating treatment well, able to recover and function and continue working.  He will receive Imodium and Tylenol today in clinic, I refilled tramadol.  We reviewed the CBC and CMP from today.  He will start oral potassium supplement once daily for  K3.4.  I encouraged him to remain hydrated.  No other supportive care needed today.  He will return for lab and follow-up on 11/1, anticipate completing chemoRT on 11/3.  All questions were answered. The patient knows to call the clinic with any problems, questions or concerns. No barriers to learning was detected.     Alla Feeling, NP 11/23/19

## 2019-11-23 ENCOUNTER — Inpatient Hospital Stay: Payer: 59

## 2019-11-23 ENCOUNTER — Encounter: Payer: Self-pay | Admitting: Nurse Practitioner

## 2019-11-23 ENCOUNTER — Inpatient Hospital Stay: Payer: 59 | Admitting: Nurse Practitioner

## 2019-11-23 ENCOUNTER — Ambulatory Visit
Admission: RE | Admit: 2019-11-23 | Discharge: 2019-11-23 | Disposition: A | Discharge status: Home / Self Care | Payer: 59 | Source: Ambulatory Visit | Attending: Radiation Oncology | Admitting: Radiation Oncology

## 2019-11-23 ENCOUNTER — Other Ambulatory Visit: Payer: Self-pay

## 2019-11-23 VITALS — BP 159/90 | HR 85 | Temp 97.0°F | Resp 18 | Ht 67.0 in | Wt 131.1 lb

## 2019-11-23 DIAGNOSIS — C20 Malignant neoplasm of rectum: Secondary | ICD-10-CM

## 2019-11-23 DIAGNOSIS — K6289 Other specified diseases of anus and rectum: Secondary | ICD-10-CM | POA: Diagnosis not present

## 2019-11-23 DIAGNOSIS — Z95828 Presence of other vascular implants and grafts: Secondary | ICD-10-CM

## 2019-11-23 LAB — CBC WITH DIFFERENTIAL (CANCER CENTER ONLY)
Abs Immature Granulocytes: 0.03 10*3/uL (ref 0.00–0.07)
Basophils Absolute: 0 10*3/uL (ref 0.0–0.1)
Basophils Relative: 1 %
Eosinophils Absolute: 0.3 10*3/uL (ref 0.0–0.5)
Eosinophils Relative: 9 %
HCT: 35.4 % — ABNORMAL LOW (ref 39.0–52.0)
Hemoglobin: 12.1 g/dL — ABNORMAL LOW (ref 13.0–17.0)
Immature Granulocytes: 1 %
Lymphocytes Relative: 21 %
Lymphs Abs: 0.7 10*3/uL (ref 0.7–4.0)
MCH: 30.6 pg (ref 26.0–34.0)
MCHC: 34.2 g/dL (ref 30.0–36.0)
MCV: 89.6 fL (ref 80.0–100.0)
Monocytes Absolute: 0.4 10*3/uL (ref 0.1–1.0)
Monocytes Relative: 12 %
Neutro Abs: 2 10*3/uL (ref 1.7–7.7)
Neutrophils Relative %: 56 %
Platelet Count: 152 10*3/uL (ref 150–400)
RBC: 3.95 MIL/uL — ABNORMAL LOW (ref 4.22–5.81)
RDW: 15.5 % (ref 11.5–15.5)
WBC Count: 3.5 10*3/uL — ABNORMAL LOW (ref 4.0–10.5)
nRBC: 0 % (ref 0.0–0.2)

## 2019-11-23 LAB — CMP (CANCER CENTER ONLY)
ALT: 22 U/L (ref 0–44)
AST: 20 U/L (ref 15–41)
Albumin: 3.4 g/dL — ABNORMAL LOW (ref 3.5–5.0)
Alkaline Phosphatase: 80 U/L (ref 38–126)
Anion gap: 5 (ref 5–15)
BUN: 10 mg/dL (ref 8–23)
CO2: 27 mmol/L (ref 22–32)
Calcium: 8.3 mg/dL — ABNORMAL LOW (ref 8.9–10.3)
Chloride: 110 mmol/L (ref 98–111)
Creatinine: 0.73 mg/dL (ref 0.61–1.24)
GFR, Estimated: >60 mL/min (ref >60)
Glucose, Bld: 152 mg/dL — ABNORMAL HIGH (ref 70–99)
Potassium: 3.4 mmol/L — ABNORMAL LOW (ref 3.5–5.1)
Sodium: 142 mmol/L (ref 135–145)
Total Bilirubin: 0.6 mg/dL (ref 0.3–1.2)
Total Protein: 6.1 g/dL — ABNORMAL LOW (ref 6.5–8.1)

## 2019-11-23 MED ORDER — SODIUM CHLORIDE 0.9% FLUSH
10.0000 mL | Freq: Once | INTRAVENOUS | Status: AC
  Administered 2019-11-23: 10 mL
  Filled 2019-11-23: qty 10

## 2019-11-23 MED ORDER — HEPARIN SOD (PORK) LOCK FLUSH 100 UNIT/ML IV SOLN
500.0000 [IU] | Freq: Once | INTRAVENOUS | Status: AC
  Administered 2019-11-23: 500 [IU]
  Filled 2019-11-23: qty 5

## 2019-11-23 MED ORDER — ACETAMINOPHEN 325 MG PO TABS
650.0000 mg | ORAL_TABLET | Freq: Once | ORAL | Status: AC
  Administered 2019-11-23: 650 mg via ORAL

## 2019-11-23 MED ORDER — LOPERAMIDE HCL 2 MG PO CAPS
ORAL_CAPSULE | ORAL | Status: AC
  Filled 2019-11-23: qty 2

## 2019-11-23 MED ORDER — TRAMADOL HCL 50 MG PO TABS
50.0000 mg | ORAL_TABLET | Freq: Four times a day (QID) | ORAL | 0 refills | Status: DC | PRN
Start: 2019-11-23 — End: 2019-12-15

## 2019-11-23 MED ORDER — POTASSIUM CHLORIDE CRYS ER 20 MEQ PO TBCR: 20.0000 meq | EXTENDED_RELEASE_TABLET | Freq: Every day | ORAL | 0 refills | Status: DC

## 2019-11-23 MED ORDER — LOPERAMIDE HCL 2 MG PO TABS
4.0000 mg | ORAL_TABLET | Freq: Once | ORAL | Status: AC
  Administered 2019-11-23: 4 mg via ORAL

## 2019-11-23 MED ORDER — ACETAMINOPHEN 325 MG PO TABS
ORAL_TABLET | ORAL | Status: AC
  Filled 2019-11-23: qty 2

## 2019-11-24 ENCOUNTER — Ambulatory Visit
Admission: RE | Admit: 2019-11-24 | Discharge: 2019-11-24 | Disposition: A | Discharge status: Home / Self Care | Payer: 59 | Source: Ambulatory Visit | Attending: Radiation Oncology | Admitting: Radiation Oncology

## 2019-11-24 DIAGNOSIS — C20 Malignant neoplasm of rectum: Secondary | ICD-10-CM | POA: Diagnosis not present

## 2019-11-25 ENCOUNTER — Ambulatory Visit
Admission: RE | Admit: 2019-11-25 | Discharge: 2019-11-25 | Disposition: A | Discharge status: Home / Self Care | Payer: 59 | Source: Ambulatory Visit | Attending: Radiation Oncology | Admitting: Radiation Oncology

## 2019-11-25 ENCOUNTER — Telehealth: Payer: Self-pay | Admitting: *Deleted

## 2019-11-25 DIAGNOSIS — C20 Malignant neoplasm of rectum: Secondary | ICD-10-CM | POA: Diagnosis not present

## 2019-11-25 NOTE — Telephone Encounter (Signed)
Spoke with the patient's sister about all the loose stools he has been having.  I explained to her that this is the result of his radiation treatments.  She had questions about imodium and how to take it.  I advised her that he can take 2 tablets in the morning, 2 around lunch time and 2 in the evening.  She states that he is also having pain with bowel movements and asked about taking tramadol.  We discussed instructions for how to take his tramadol.  She verbalized understanding of all instructions given, she will pass the information on to her brother as well as the patient.  Advised her to call if she has any further questions or concerns.  Gloriajean Dell. Leonie Green, BSN

## 2019-11-26 ENCOUNTER — Ambulatory Visit
Admission: RE | Admit: 2019-11-26 | Discharge: 2019-11-26 | Disposition: A | Discharge status: Home / Self Care | Payer: 59 | Source: Ambulatory Visit | Attending: Radiation Oncology | Admitting: Radiation Oncology

## 2019-11-26 DIAGNOSIS — C20 Malignant neoplasm of rectum: Secondary | ICD-10-CM | POA: Diagnosis not present

## 2019-11-27 ENCOUNTER — Ambulatory Visit
Admission: RE | Admit: 2019-11-27 | Discharge: 2019-11-27 | Disposition: A | Discharge status: Home / Self Care | Payer: 59 | Source: Ambulatory Visit | Attending: Radiation Oncology | Admitting: Radiation Oncology

## 2019-11-27 ENCOUNTER — Ambulatory Visit: Payer: 59

## 2019-11-27 DIAGNOSIS — C20 Malignant neoplasm of rectum: Secondary | ICD-10-CM | POA: Diagnosis not present

## 2019-11-30 ENCOUNTER — Other Ambulatory Visit: Payer: Self-pay

## 2019-11-30 ENCOUNTER — Inpatient Hospital Stay: Payer: 59

## 2019-11-30 ENCOUNTER — Ambulatory Visit
Admission: RE | Admit: 2019-11-30 | Discharge: 2019-11-30 | Disposition: A | Discharge status: Home / Self Care | Payer: 59 | Source: Ambulatory Visit | Attending: Radiation Oncology | Admitting: Radiation Oncology

## 2019-11-30 ENCOUNTER — Ambulatory Visit: Payer: 59

## 2019-11-30 VITALS — BP 118/66 | HR 64 | Resp 18

## 2019-11-30 DIAGNOSIS — C20 Malignant neoplasm of rectum: Secondary | ICD-10-CM

## 2019-11-30 DIAGNOSIS — Z95828 Presence of other vascular implants and grafts: Secondary | ICD-10-CM

## 2019-11-30 LAB — CBC WITH DIFFERENTIAL (CANCER CENTER ONLY)
Abs Immature Granulocytes: 0.03 10*3/uL (ref 0.00–0.07)
Basophils Absolute: 0 10*3/uL (ref 0.0–0.1)
Basophils Relative: 1 %
Eosinophils Absolute: 0.5 10*3/uL (ref 0.0–0.5)
Eosinophils Relative: 12 %
HCT: 33.9 % — ABNORMAL LOW (ref 39.0–52.0)
Hemoglobin: 11.3 g/dL — ABNORMAL LOW (ref 13.0–17.0)
Immature Granulocytes: 1 %
Lymphocytes Relative: 23 %
Lymphs Abs: 0.9 10*3/uL (ref 0.7–4.0)
MCH: 30.9 pg (ref 26.0–34.0)
MCHC: 33.3 g/dL (ref 30.0–36.0)
MCV: 92.6 fL (ref 80.0–100.0)
Monocytes Absolute: 0.5 10*3/uL (ref 0.1–1.0)
Monocytes Relative: 13 %
Neutro Abs: 1.9 10*3/uL (ref 1.7–7.7)
Neutrophils Relative %: 50 %
Platelet Count: 146 10*3/uL — ABNORMAL LOW (ref 150–400)
RBC: 3.66 MIL/uL — ABNORMAL LOW (ref 4.22–5.81)
RDW: 15.6 % — ABNORMAL HIGH (ref 11.5–15.5)
WBC Count: 3.7 10*3/uL — ABNORMAL LOW (ref 4.0–10.5)
nRBC: 0 % (ref 0.0–0.2)

## 2019-11-30 LAB — CMP (CANCER CENTER ONLY)
ALT: 17 U/L (ref 0–44)
AST: 22 U/L (ref 15–41)
Albumin: 3.7 g/dL (ref 3.5–5.0)
Alkaline Phosphatase: 83 U/L (ref 38–126)
Anion gap: 3 — ABNORMAL LOW (ref 5–15)
BUN: 11 mg/dL (ref 8–23)
CO2: 28 mmol/L (ref 22–32)
Calcium: 8.9 mg/dL (ref 8.9–10.3)
Chloride: 106 mmol/L (ref 98–111)
Creatinine: 0.7 mg/dL (ref 0.61–1.24)
GFR, Estimated: >60 mL/min (ref >60)
Glucose, Bld: 95 mg/dL (ref 70–99)
Potassium: 3.9 mmol/L (ref 3.5–5.1)
Sodium: 137 mmol/L (ref 135–145)
Total Bilirubin: 0.6 mg/dL (ref 0.3–1.2)
Total Protein: 6.4 g/dL — ABNORMAL LOW (ref 6.5–8.1)

## 2019-11-30 MED ORDER — HEPARIN SOD (PORK) LOCK FLUSH 100 UNIT/ML IV SOLN
500.0000 [IU] | Freq: Once | INTRAVENOUS | Status: AC
  Administered 2019-11-30: 500 [IU]
  Filled 2019-11-30: qty 5

## 2019-11-30 MED ORDER — SODIUM CHLORIDE 0.9% FLUSH
10.0000 mL | Freq: Once | INTRAVENOUS | Status: AC
  Administered 2019-11-30: 10 mL
  Filled 2019-11-30: qty 10

## 2019-11-30 MED ORDER — HEPARIN SOD (PORK) LOCK FLUSH 100 UNIT/ML IV SOLN
250.0000 [IU] | Freq: Once | INTRAVENOUS | Status: DC
  Filled 2019-11-30: qty 5

## 2019-12-01 ENCOUNTER — Ambulatory Visit: Payer: 59

## 2019-12-01 ENCOUNTER — Other Ambulatory Visit: Payer: Self-pay

## 2019-12-01 ENCOUNTER — Ambulatory Visit
Admission: RE | Admit: 2019-12-01 | Discharge: 2019-12-01 | Disposition: A | Discharge status: Home / Self Care | Payer: 59 | Source: Ambulatory Visit | Attending: Radiation Oncology | Admitting: Radiation Oncology

## 2019-12-01 DIAGNOSIS — C20 Malignant neoplasm of rectum: Secondary | ICD-10-CM | POA: Diagnosis not present

## 2019-12-02 ENCOUNTER — Ambulatory Visit: Payer: 59

## 2019-12-02 ENCOUNTER — Ambulatory Visit
Admission: RE | Admit: 2019-12-02 | Discharge: 2019-12-02 | Disposition: A | Discharge status: Home / Self Care | Payer: 59 | Source: Ambulatory Visit | Attending: Radiation Oncology | Admitting: Radiation Oncology

## 2019-12-02 ENCOUNTER — Other Ambulatory Visit: Payer: Self-pay

## 2019-12-02 DIAGNOSIS — C20 Malignant neoplasm of rectum: Secondary | ICD-10-CM | POA: Diagnosis not present

## 2019-12-03 ENCOUNTER — Ambulatory Visit: Payer: 59 | Admitting: Speech Pathology

## 2019-12-03 ENCOUNTER — Ambulatory Visit
Admission: RE | Admit: 2019-12-03 | Discharge: 2019-12-03 | Disposition: A | Discharge status: Home / Self Care | Payer: 59 | Source: Ambulatory Visit | Attending: Radiation Oncology | Admitting: Radiation Oncology

## 2019-12-03 ENCOUNTER — Ambulatory Visit: Payer: 59

## 2019-12-03 ENCOUNTER — Telehealth: Payer: Self-pay

## 2019-12-03 DIAGNOSIS — C20 Malignant neoplasm of rectum: Secondary | ICD-10-CM | POA: Diagnosis not present

## 2019-12-03 NOTE — Telephone Encounter (Signed)
Carlos [name] called wanting to confirm that MR Lenz will only be taking the xeloda during radiation.  Per Dr. Ernestina Penna note that is accurate.  She verbalized understanding.

## 2019-12-04 ENCOUNTER — Ambulatory Visit: Payer: 59

## 2019-12-04 ENCOUNTER — Ambulatory Visit
Admission: RE | Admit: 2019-12-04 | Discharge: 2019-12-04 | Disposition: A | Discharge status: Home / Self Care | Payer: 59 | Source: Ambulatory Visit | Attending: Radiation Oncology | Admitting: Radiation Oncology

## 2019-12-04 ENCOUNTER — Other Ambulatory Visit: Payer: Self-pay

## 2019-12-04 DIAGNOSIS — C20 Malignant neoplasm of rectum: Secondary | ICD-10-CM | POA: Diagnosis not present

## 2019-12-04 NOTE — Progress Notes (Signed)
Carlos [name]   [phone] [phone]   Clinic Follow up Note   Patient Care Team: [name], MD as PCP - General (Family Medicine) Cameron Sprang, MD as Consulting Physician (Neurology) Jonnie Finner, RN as Oncology Nurse Navigator Truitt Merle, MD as Consulting Physician (Hematology) Ronnette Juniper, MD as Consulting Physician (Gastroenterology)  Date of Service:  12/07/2019  CHIEF COMPLAINT: F/u rectal cancer  SUMMARY OF ONCOLOGIC HISTORY: Oncology History Overview Note  Cancer Staging Rectal adenocarcinoma [facility]) Staging form: Colon and Rectum, AJCC 8th Edition - Clinical stage from 06/12/2019: cT4, cN1 - Unsigned    Rectal adenocarcinoma ([place])  05/29/2019 Procedure   Colonoscopy per Dr. Therisa Doyne A digital rectal exam was normal  frond-like fungating infiltrative non-obstructing large mass in the rectum. The mass was partially circumferential, measured 5 cm in length. The colonoscopy also showed 11 sessile polyps in the transverse and asecnding colon and hepatic flexure.    05/29/2019 Initial Biopsy   Pathology on the rectal biopsy showed at least intramucosal adenocarcinoma involving tubulovillous adenoma with high grade dysplasia.   06/05/2019 Imaging   CT CAP IMPRESSION: 1. Cholelithiasis and gallbladder wall thickening with equivocal pericholecystic inflammation. Acute cholecystitis not excluded. If there is clinical suspicion for acute cholecystitis, recommend ultrasound evaluation. 2. Approximately 5 cm irregular mass within the distal sigmoid colon with possible extension through the wall. No definite abnormal adjacent lymph nodes. 3. Mild omental haziness-nonspecific. Metastatic disease not excluded. 4. Indeterminate 3-4 mm RIGHT pulmonary nodules which could represent metastatic disease. These are too small for PET CT evaluation. Consider CT follow-up. 5. 2.5 cm irregular cystic mass within the RIGHT kidney. Elective MRI  recommended for further evaluation. 6. 4 mm nonobstructing LEFT LOWER pole renal calculus. 7. Coronary artery disease. 8. Aortic Atherosclerosis (ICD10-I70.0).   06/12/2019 Imaging   Staging MRI pelvis  FINDINGS: TUMOR LOCATION Tumor distance from Anal Verge/Skin Surface:  11.4 cm Tumor distance to Internal Anal Sphincter: 6.5 cm TUMOR DESCRIPTION Circumferential Extent: 100% Tumor Length: 8.3 cm T - CATEGORY Extension through Muscularis Propria: Yes>87mm=T3d Shortest Distance of any tumor/node from Mesorectal Fascia: 0 mm, along the left lateral and posterior walls (tumor abuts the left lateral pelvic sidewall and sacrum) Extramural Vascular Invasion/Tumor Thrombus: No Invasion of Anterior Peritoneal Reflection: No Involvement of Adjacent Organs or Pelvic Sidewall: Yes, involving the left lateral pelvic sidewall =T4 Levator Ani Involvement: No N - CATEGORY Mesorectal Lymph Nodes >=76mm: 2=N1 Extra-mesorectal Lymphadenopathy: No Other:  None. IMPRESSION: Rectal adenocarcinoma T stage: T4 Rectal adenocarcinoma N stage:  N1 Distance from tumor to the internal anal sphincter is 6.5 cm.   06/18/2019 Initial Diagnosis   Rectal adenocarcinoma ([place])   07/02/2019 - 10/08/2019 Chemotherapy   Total Neoadjuvant FOLFOX q2weeks starting 07/02/19-10/08/19,  8 cycles   07/13/2019 PET scan   IMPRESSION: 1. Known rectal mass is intensely hypermetabolic compatible with primary rectal adenocarcinoma. No findings of FDG avid nodal metastasis or distant metastatic disease. 2. Hyperdense kidney cysts are identified bilaterally favored to represent hemorrhagic cyst. 3. Nonobstructing left renal calculi. 4. Aortic atherosclerosis, in addition to lad coronary artery disease. Please note that although the presence of coronary artery calcium documents the presence of coronary artery disease, the severity of this disease and any potential stenosis cannot be assessed on this non-gated CT examination.  Assessment for potential risk factor modification, dietary therapy or pharmacologic therapy may be warranted, if clinically indicated. Aortic Atherosclerosis (ICD10-I70.0). 5. Gallstones.   09/11/2019 - 09/14/2019 Hospital Admission   Acute  AMS, seizure 09/11/19 -Developed acute altered mental status day after Cycle 6 chemo (09/11/19).  -He was admitted for work up, EEG showed epileptic spike. ID work up negative, MRI negative for metastasis.    09/11/2019 Imaging   MRI Brain  IMPRESSION: Incomplete study. Images obtained reveal no acute abnormality. Negative for acute infarct.   Atrophy and mild ventricular enlargement stable from prior studies.   09/11/2019 Imaging   CT head  IMPRESSION: Mild ventricular prominence with normal appearing sulci. Question early communicating hydrocephalus. Brain parenchyma appears unremarkable. No acute infarct. No mass or hemorrhage.   Foci of arterial vascular calcification noted. There is mucosal thickening in several ethmoid air cells.   There is probable cerumen in the right external auditory canal.     09/23/2019 Imaging   CT AP  IMPRESSION: 1. Solid-appearing left eccentric upper rectal mass measures about 6.7 by 2.9 cm. This is difficult to compare directly to the prior exam given differences in orientation and degree of distension of the rectum. There is some prominence of stool in the sigmoid colon and rectum on today's examination, but the mass is not obstructive. 2. Other imaging findings of potential clinical significance: Cholelithiasis. Complex bilateral renal lesions are similar to prior exams. Nonobstructive left nephrolithiasis. Mild prostatomegaly with nodular prominence the upper margin of the prostate gland. Lumbar spondylosis and degenerative disc disease causing multilevel foraminal impingement. 3. Aortic atherosclerosis.   Aortic Atherosclerosis (ICD10-I70.0).   11/02/2019 - 12/09/2019 Chemotherapy   Concurrent chemo RT with  Oral Xeloda 1500mg  BID on days of RT starting 11/02/19-12/09/19   11/02/2019 - 12/09/2019 Radiation Therapy   Concurrent chemo RT by Dr Lisbeth Renshaw with Xeloda  starting 11/02/19-12/09/19      CURRENT THERAPY:  Concurrent chemo RT with Oral Xeloda 1500mg  BID starting 11/02/19. Plan to complete 12/09/19  INTERVAL HISTORY:  Carlos [name] is here for a follow up. He presents to the clinic with his sister. He notes one of his brother died 2 days ago. He notes radiation is going well mostly. He notes he wants loose stool but not diarrhea given his irritated rectum. His BM is currently loose, but still causes pain. He was taking Miralax once daily but stopped after diarrhea. His sister wonders about his BG. He notes he is eating well but does have lower protein.    REVIEW OF SYSTEMS:   Constitutional: Denies fevers, chills or abnormal weight loss Eyes: Denies blurriness of vision Ears, nose, mouth, throat, and face: Denies mucositis or sore throat Respiratory: Denies cough, dyspnea or wheezes Cardiovascular: Denies palpitation, chest discomfort or lower extremity swelling Gastrointestinal:  Denies nausea, heartburn or change in bowel habits Skin: Denies abnormal skin rashes Lymphatics: Denies new lymphadenopathy or easy bruising Neurological:Denies numbness, tingling or new weaknesses Behavioral/Psych: Mood is stable, no new changes  All other systems were reviewed with the patient and are negative.  MEDICAL HISTORY:  Past Medical History:  Diagnosis Date  . Anxiety   . Benign prostatic hyperplasia 03/30/2013   10/1 IMO update  . Cancer ([place])   . 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 PORTACATH PLACEMENT N/A 07/01/2019   Procedure: PORT ULTRASOUND GUIDED PLACEMENT;  Surgeon: Alphonsa Overall, MD;  Location: [facility];  Service: General;  Laterality: N/A;  . PROSTATE SURGERY  2004  . TONSILECTOMY,  ADENOIDECTOMY, BILATERAL MYRINGOTOMY AND TUBES      I have reviewed the social history and family history  with the patient and they are unchanged from previous note.  ALLERGIES:  has No Known Allergies.  MEDICATIONS:  Current Outpatient Medications  Medication Sig Dispense Refill  . capecitabine (XELODA) 500 MG tablet Take 3 tablets (1,500 mg total) by mouth 2 (two) times daily after a meal. Take on days of radiation, Monday through Friday 90 tablet 0  . HYDROcodone-Acetaminophen 5-300 MG TABS Take 1 tablet by mouth every 8 (eight) hours as needed (for severe pain as needed). 20 tablet 0  . levETIRAcetam (KEPPRA) 1000 MG tablet Take 1 tablet (1,000 mg total) by mouth 2 (two) times daily. 60 tablet 11  . Lidocaine, Anorectal, 50 MG SUPP Place 50 mg rectally every 8 (eight) hours as needed. 14 suppository 2  . lidocaine-prilocaine (EMLA) cream Apply topically once.    . potassium chloride SA (KLOR-CON) 20 MEQ tablet Take 1 tablet (20 mEq total) by mouth daily. 14 tablet 0  . traMADol (ULTRAM) 50 MG tablet Take 1 tablet (50 mg total) by mouth every 6 (six) hours as needed for moderate pain. 30 tablet 0   No current facility-administered medications for this visit.    PHYSICAL EXAMINATION: ECOG PERFORMANCE STATUS: 1 - Symptomatic but completely ambulatory  Vitals:   12/07/19 1019  BP: (!) 154/87  Pulse: 93  Resp: 20  Temp: 97.8 F (36.6 C)  SpO2: 100%   Filed Weights   12/07/19 1019  Weight: 131 lb 11.2 oz (59.7 kg)    GENERAL:alert, no distress and comfortable SKIN: skin color, texture, turgor are normal, no rashes or significant lesions EYES: normal, Conjunctiva are pink and non-injected, sclera clear  NECK: supple, thyroid normal size, non-tender, without nodularity LYMPH:  no palpable lymphadenopathy in the cervical, axillary  LUNGS: clear to auscultation and percussion with normal breathing effort HEART: regular rate & rhythm and no murmurs and no lower extremity  edema ABDOMEN:abdomen soft, non-tender and normal bowel sounds Musculoskeletal:no cyanosis of digits and no clubbing  NEURO: alert & oriented x 3 with fluent speech, no focal motor/sensory deficits External RECTAL: No skin breakdown or significant skin erythema  LABORATORY DATA:  I have reviewed the data as listed CBC Latest Ref Rng & Units 12/07/2019 11/30/2019 11/23/2019  WBC 4.0 - 10.5 K/uL 3.7(L) 3.7(L) 3.5(L)  Hemoglobin 13.0 - 17.0 g/dL 12.2(L) 11.3(L) 12.1(L)  Hematocrit 39 - 52 % 36.0(L) 33.9(L) 35.4(L)  Platelets 150 - 400 K/uL 181 146(L) 152     CMP Latest Ref Rng & Units 12/07/2019 11/30/2019 11/23/2019  Glucose 70 - 99 mg/dL 105(H) 95 152(H)  BUN 8 - 23 mg/dL 9 11 10   Creatinine 0.61 - 1.24 mg/dL 0.76 0.70 0.73  Sodium 135 - 145 mmol/L 139 137 142  Potassium 3.5 - 5.1 mmol/L 4.2 3.9 3.4(L)  Chloride 98 - 111 mmol/L 105 106 110  CO2 22 - 32 mmol/L 28 28 27   Calcium 8.9 - 10.3 mg/dL 9.6 8.9 8.3(L)  Total Protein 6.5 - 8.1 g/dL 7.2 6.4(L) 6.1(L)  Total Bilirubin 0.3 - 1.2 mg/dL 0.6 0.6 0.6  Alkaline Phos 38 - 126 U/L 87 83 80  AST 15 - 41 U/L 31 22 20   ALT 0 - 44 U/L 24 17 22       RADIOGRAPHIC STUDIES: I have personally reviewed the radiological images as listed and agreed with the findings in the report. No results found.   ASSESSMENT & PLAN:  Carlos [name] is a 74 y.o. male with    1. Adenocarcinoma of the rectum, cT4N1M0 stage  III -He was recently diagnosed in 4/2021with locally advanced stage III rectal cancer.  -He recently completedneoadjuvant chemotherapy with 8 cycles ofFOLFOX 07/02/19-10/08/19. He is currently on concurrent chemoRT with Xeloda1500mg  BIDfor 5-6 weeks 11/02/19-12/09/19. Then he will proceed withsurgery.The treatment goal is curative. -He continues to tolerate chemoRT with rectal irritation and no skin breakdown on exam today. I reviewed management with him.  -Labs reviewed, WBC 3.7, Hg 12.2. Overall adequate to proceed with RT and  Xeloda at same dose to complete on 12/09/19.  -He will f/u with his surgeon at [facility] this week. -F/u in 4 weeks   2. Rectal Pain, secondary to #1 -onset in mid August. -Previously on Tramadol, now on Hydrocodone. Once he finds a compounding pharmacy I will call in his lidocaine/anusol suppositories.If he cannot find this anusol he can use other topical OTC options.  -Miralax was too much for him. I recommend Colase stool softener which is gentler. I also recommend Sitz bath. He is willing to try.   3. Acute AMS, seizure 09/11/19 -Developed acute altered mental status day after Cycle 6 chemo(09/11/19). -[facility] admitted for work up, EEG showed epileptic spike. ID work up negative, MRI negative for metastasis. -Followed by Dr. Delice Lesch  4. Mild cognitive impairment, Social support -followed by [employer] [facility] -He is a reliable historian, independent with ADLs, a/o x4, no signs of obvious impairment -Will monitor for neurotoxicities on chemo, especially oxaliplatin -He has very good family support from his 2 sisters.  -He currently has been able to go to work everyday since being on chemo. -Pt's sister is very concerned about his long working hour (pt would like to increase).After lengthy discussion, patient agreed to stay on 40 hours a week.    PLAN: -Continue Radiation to complete 11/3 -Continue Xeloda 1500mg  BID on days of RT to complete 11/3 -Lab and f/u in 4 weeks  -He will follow-up with his surgeon at [facility] later this week   No problem-specific Assessment & Plan notes found for this encounter.   No orders of the defined types were placed in this encounter.  All questions were answered. The patient knows to call the clinic with any problems, questions or concerns. No barriers to learning was detected. The total time spent in the appointment was 30 minutes.     Truitt Merle, MD 12/07/2019   I, Joslyn Devon, am acting as scribe for Truitt Merle, MD.   I have reviewed the above documentation for accuracy and completeness, and I agree with the above.

## 2019-12-07 ENCOUNTER — Other Ambulatory Visit: Payer: Self-pay

## 2019-12-07 ENCOUNTER — Inpatient Hospital Stay: Payer: 59 | Attending: Nurse Practitioner | Admitting: Hematology

## 2019-12-07 ENCOUNTER — Ambulatory Visit
Admission: RE | Admit: 2019-12-07 | Discharge: 2019-12-07 | Disposition: A | Discharge status: Home / Self Care | Payer: 59 | Source: Ambulatory Visit | Attending: Radiation Oncology | Admitting: Radiation Oncology

## 2019-12-07 ENCOUNTER — Encounter: Payer: Self-pay | Admitting: Hematology

## 2019-12-07 ENCOUNTER — Inpatient Hospital Stay: Payer: 59

## 2019-12-07 VITALS — BP 154/87 | HR 93 | Temp 97.8°F | Resp 20 | Ht 67.0 in | Wt 131.7 lb

## 2019-12-07 DIAGNOSIS — I700 Atherosclerosis of aorta: Secondary | ICD-10-CM | POA: Diagnosis not present

## 2019-12-07 DIAGNOSIS — C20 Malignant neoplasm of rectum: Secondary | ICD-10-CM | POA: Insufficient documentation

## 2019-12-07 DIAGNOSIS — I2510 Atherosclerotic heart disease of native coronary artery without angina pectoris: Secondary | ICD-10-CM | POA: Insufficient documentation

## 2019-12-07 DIAGNOSIS — G3184 Mild cognitive impairment, so stated: Secondary | ICD-10-CM | POA: Insufficient documentation

## 2019-12-07 DIAGNOSIS — Z79899 Other long term (current) drug therapy: Secondary | ICD-10-CM | POA: Diagnosis not present

## 2019-12-07 DIAGNOSIS — G893 Neoplasm related pain (acute) (chronic): Secondary | ICD-10-CM | POA: Insufficient documentation

## 2019-12-07 DIAGNOSIS — Z95828 Presence of other vascular implants and grafts: Secondary | ICD-10-CM

## 2019-12-07 DIAGNOSIS — S72142A Displaced intertrochanteric fracture of left femur, initial encounter for closed fracture: Secondary | ICD-10-CM | POA: Diagnosis not present

## 2019-12-07 DIAGNOSIS — Z510 Encounter for antineoplastic radiation therapy: Secondary | ICD-10-CM | POA: Insufficient documentation

## 2019-12-07 DIAGNOSIS — R569 Unspecified convulsions: Secondary | ICD-10-CM | POA: Diagnosis not present

## 2019-12-07 LAB — CBC WITH DIFFERENTIAL (CANCER CENTER ONLY)
Abs Immature Granulocytes: 0.04 10*3/uL (ref 0.00–0.07)
Basophils Absolute: 0 10*3/uL (ref 0.0–0.1)
Basophils Relative: 0 %
Eosinophils Absolute: 0.2 10*3/uL (ref 0.0–0.5)
Eosinophils Relative: 6 %
HCT: 36 % — ABNORMAL LOW (ref 39.0–52.0)
Hemoglobin: 12.2 g/dL — ABNORMAL LOW (ref 13.0–17.0)
Immature Granulocytes: 1 %
Lymphocytes Relative: 13 %
Lymphs Abs: 0.5 10*3/uL — ABNORMAL LOW (ref 0.7–4.0)
MCH: 31 pg (ref 26.0–34.0)
MCHC: 33.9 g/dL (ref 30.0–36.0)
MCV: 91.4 fL (ref 80.0–100.0)
Monocytes Absolute: 0.5 10*3/uL (ref 0.1–1.0)
Monocytes Relative: 13 %
Neutro Abs: 2.5 10*3/uL (ref 1.7–7.7)
Neutrophils Relative %: 67 %
Platelet Count: 181 10*3/uL (ref 150–400)
RBC: 3.94 MIL/uL — ABNORMAL LOW (ref 4.22–5.81)
RDW: 15.9 % — ABNORMAL HIGH (ref 11.5–15.5)
WBC Count: 3.7 10*3/uL — ABNORMAL LOW (ref 4.0–10.5)
nRBC: 0 % (ref 0.0–0.2)

## 2019-12-07 LAB — CMP (CANCER CENTER ONLY)
ALT: 24 U/L (ref 0–44)
AST: 31 U/L (ref 15–41)
Albumin: 4 g/dL (ref 3.5–5.0)
Alkaline Phosphatase: 87 U/L (ref 38–126)
Anion gap: 6 (ref 5–15)
BUN: 9 mg/dL (ref 8–23)
CO2: 28 mmol/L (ref 22–32)
Calcium: 9.6 mg/dL (ref 8.9–10.3)
Chloride: 105 mmol/L (ref 98–111)
Creatinine: 0.76 mg/dL (ref 0.61–1.24)
GFR, Estimated: >60 mL/min (ref >60)
Glucose, Bld: 105 mg/dL — ABNORMAL HIGH (ref 70–99)
Potassium: 4.2 mmol/L (ref 3.5–5.1)
Sodium: 139 mmol/L (ref 135–145)
Total Bilirubin: 0.6 mg/dL (ref 0.3–1.2)
Total Protein: 7.2 g/dL (ref 6.5–8.1)

## 2019-12-07 MED ORDER — HEPARIN SOD (PORK) LOCK FLUSH 100 UNIT/ML IV SOLN
500.0000 [IU] | Freq: Once | INTRAVENOUS | Status: AC
  Administered 2019-12-07: 500 [IU]
  Filled 2019-12-07: qty 5

## 2019-12-07 MED ORDER — SODIUM CHLORIDE 0.9% FLUSH
10.0000 mL | Freq: Once | INTRAVENOUS | Status: AC
  Administered 2019-12-07: 10 mL
  Filled 2019-12-07: qty 10

## 2019-12-08 ENCOUNTER — Inpatient Hospital Stay (HOSPITAL_COMMUNITY)
Admission: EM | Admit: 2019-12-08 | Discharge: 2019-12-15 | DRG: 481 | Disposition: A | Discharge status: Rehabilitation Facility | Payer: PRIVATE HEALTH INSURANCE | Attending: Internal Medicine | Admitting: Internal Medicine

## 2019-12-08 ENCOUNTER — Emergency Department (HOSPITAL_COMMUNITY): Payer: PRIVATE HEALTH INSURANCE

## 2019-12-08 ENCOUNTER — Ambulatory Visit
Admission: RE | Admit: 2019-12-08 | Discharge: 2019-12-08 | Disposition: A | Discharge status: Home / Self Care | Payer: 59 | Source: Ambulatory Visit | Attending: Radiation Oncology | Admitting: Radiation Oncology

## 2019-12-08 ENCOUNTER — Observation Stay (HOSPITAL_COMMUNITY): Payer: PRIVATE HEALTH INSURANCE

## 2019-12-08 ENCOUNTER — Other Ambulatory Visit: Payer: Self-pay

## 2019-12-08 ENCOUNTER — Encounter (HOSPITAL_COMMUNITY): Payer: Self-pay

## 2019-12-08 DIAGNOSIS — E872 Acidosis: Secondary | ICD-10-CM | POA: Diagnosis present

## 2019-12-08 DIAGNOSIS — E7220 Disorder of urea cycle metabolism, unspecified: Secondary | ICD-10-CM | POA: Diagnosis not present

## 2019-12-08 DIAGNOSIS — G40919 Epilepsy, unspecified, intractable, without status epilepticus: Secondary | ICD-10-CM | POA: Diagnosis present

## 2019-12-08 DIAGNOSIS — L8990 Pressure ulcer of unspecified site, unspecified stage: Secondary | ICD-10-CM | POA: Insufficient documentation

## 2019-12-08 DIAGNOSIS — Y92230 Patient room in hospital as the place of occurrence of the external cause: Secondary | ICD-10-CM | POA: Diagnosis present

## 2019-12-08 DIAGNOSIS — F419 Anxiety disorder, unspecified: Secondary | ICD-10-CM | POA: Diagnosis present

## 2019-12-08 DIAGNOSIS — G3184 Mild cognitive impairment, so stated: Secondary | ICD-10-CM | POA: Diagnosis present

## 2019-12-08 DIAGNOSIS — N400 Enlarged prostate without lower urinary tract symptoms: Secondary | ICD-10-CM | POA: Diagnosis present

## 2019-12-08 DIAGNOSIS — D649 Anemia, unspecified: Secondary | ICD-10-CM | POA: Diagnosis present

## 2019-12-08 DIAGNOSIS — L8992 Pressure ulcer of unspecified site, stage 2: Secondary | ICD-10-CM | POA: Insufficient documentation

## 2019-12-08 DIAGNOSIS — Z20822 Contact with and (suspected) exposure to covid-19: Secondary | ICD-10-CM | POA: Diagnosis present

## 2019-12-08 DIAGNOSIS — S72122A Displaced fracture of lesser trochanter of left femur, initial encounter for closed fracture: Secondary | ICD-10-CM | POA: Diagnosis present

## 2019-12-08 DIAGNOSIS — R569 Unspecified convulsions: Secondary | ICD-10-CM

## 2019-12-08 DIAGNOSIS — C20 Malignant neoplasm of rectum: Secondary | ICD-10-CM | POA: Diagnosis present

## 2019-12-08 DIAGNOSIS — S72142A Displaced intertrochanteric fracture of left femur, initial encounter for closed fracture: Principal | ICD-10-CM

## 2019-12-08 DIAGNOSIS — L89319 Pressure ulcer of right buttock, unspecified stage: Secondary | ICD-10-CM | POA: Clinically undetermined

## 2019-12-08 DIAGNOSIS — Z79899 Other long term (current) drug therapy: Secondary | ICD-10-CM

## 2019-12-08 DIAGNOSIS — W06XXXA Fall from bed, initial encounter: Secondary | ICD-10-CM | POA: Diagnosis present

## 2019-12-08 DIAGNOSIS — Z419 Encounter for procedure for purposes other than remedying health state, unspecified: Secondary | ICD-10-CM

## 2019-12-08 DIAGNOSIS — E876 Hypokalemia: Secondary | ICD-10-CM | POA: Diagnosis present

## 2019-12-08 DIAGNOSIS — Z01818 Encounter for other preprocedural examination: Secondary | ICD-10-CM

## 2019-12-08 DIAGNOSIS — L89329 Pressure ulcer of left buttock, unspecified stage: Secondary | ICD-10-CM | POA: Clinically undetermined

## 2019-12-08 DIAGNOSIS — G40909 Epilepsy, unspecified, not intractable, without status epilepticus: Secondary | ICD-10-CM | POA: Diagnosis present

## 2019-12-08 HISTORY — DX: Epilepsy, unspecified, intractable, without status epilepticus: G40.919

## 2019-12-08 HISTORY — DX: Unspecified convulsions: R56.9

## 2019-12-08 LAB — CBC WITH DIFFERENTIAL/PLATELET
Abs Immature Granulocytes: 0.03 10*3/uL (ref 0.00–0.07)
Basophils Absolute: 0 10*3/uL (ref 0.0–0.1)
Basophils Relative: 0 %
Eosinophils Absolute: 0 10*3/uL (ref 0.0–0.5)
Eosinophils Relative: 0 %
HCT: 43 % (ref 39.0–52.0)
Hemoglobin: 13.3 g/dL (ref 13.0–17.0)
Immature Granulocytes: 1 %
Lymphocytes Relative: 16 %
Lymphs Abs: 0.9 10*3/uL (ref 0.7–4.0)
MCH: 30.9 pg (ref 26.0–34.0)
MCHC: 30.9 g/dL (ref 30.0–36.0)
MCV: 99.8 fL (ref 80.0–100.0)
Monocytes Absolute: 0.6 10*3/uL (ref 0.1–1.0)
Monocytes Relative: 11 %
Neutro Abs: 4.2 10*3/uL (ref 1.7–7.7)
Neutrophils Relative %: 72 %
Platelets: 229 10*3/uL (ref 150–400)
RBC: 4.31 MIL/uL (ref 4.22–5.81)
RDW: 16.5 % — ABNORMAL HIGH (ref 11.5–15.5)
WBC: 5.8 10*3/uL (ref 4.0–10.5)
nRBC: 0 % (ref 0.0–0.2)

## 2019-12-08 LAB — COMPREHENSIVE METABOLIC PANEL
ALT: 28 U/L (ref 0–44)
AST: 36 U/L (ref 15–41)
Albumin: 4.9 g/dL (ref 3.5–5.0)
Alkaline Phosphatase: 92 U/L (ref 38–126)
Anion gap: 26 — ABNORMAL HIGH (ref 5–15)
BUN: 13 mg/dL (ref 8–23)
CO2: 17 mmol/L — ABNORMAL LOW (ref 22–32)
Calcium: 10 mg/dL (ref 8.9–10.3)
Chloride: 102 mmol/L (ref 98–111)
Creatinine, Ser: 0.92 mg/dL (ref 0.61–1.24)
GFR, Estimated: >60 mL/min (ref >60)
Glucose, Bld: 136 mg/dL — ABNORMAL HIGH (ref 70–99)
Potassium: 3 mmol/L — ABNORMAL LOW (ref 3.5–5.1)
Sodium: 145 mmol/L (ref 135–145)
Total Bilirubin: 0.8 mg/dL (ref 0.3–1.2)
Total Protein: 8.4 g/dL — ABNORMAL HIGH (ref 6.5–8.1)

## 2019-12-08 LAB — AMMONIA
Ammonia: 182 umol/L — ABNORMAL HIGH (ref 9–35)
Ammonia: 24 umol/L (ref 9–35)

## 2019-12-08 LAB — LACTIC ACID, PLASMA: Lactic Acid, Venous: 1.9 mmol/L (ref 0.5–1.9)

## 2019-12-08 LAB — MAGNESIUM: Magnesium: 2.6 mg/dL — ABNORMAL HIGH (ref 1.7–2.4)

## 2019-12-08 LAB — CBG MONITORING, ED: Glucose-Capillary: 117 mg/dL — ABNORMAL HIGH (ref 70–99)

## 2019-12-08 IMAGING — CT CT HEAD W/O CM
3 series · 16 of 47 positions shown, 19 images · non-contrast
Comparison: [DATE].

CLINICAL DATA: Altered mental status.

EXAM:
CT HEAD WITHOUT CONTRAST
TECHNIQUE: Contiguous axial images were obtained from the base of the skull
through the vertex without intravenous contrast.

[Series 2: head wo · axial · 0.43mm/px · z∈[-128,-3]mm · 10 of 31 slices shown, 13 images]
[im 3/31  brain]
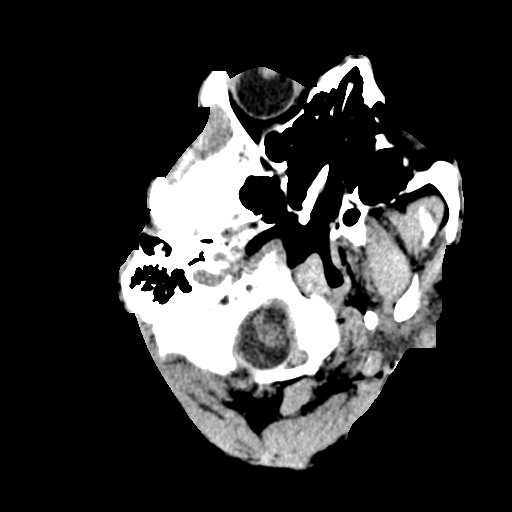
[im 3/31  bone]
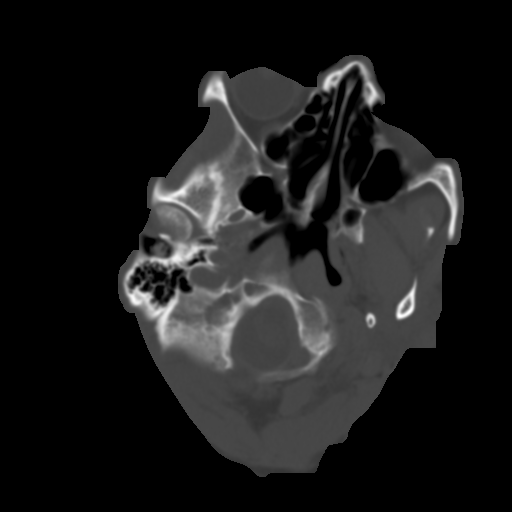
[im 6/31  brain]
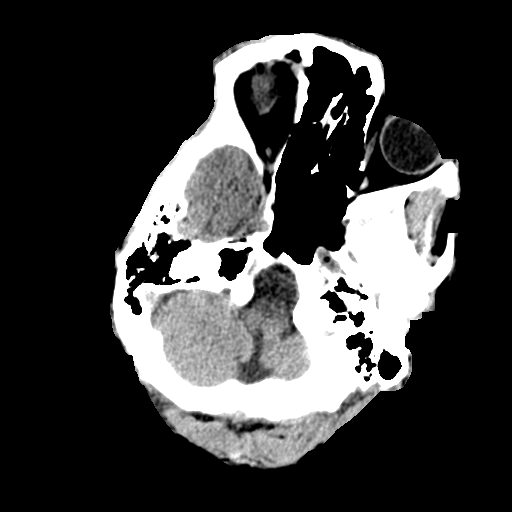
[im 9/31  brain]
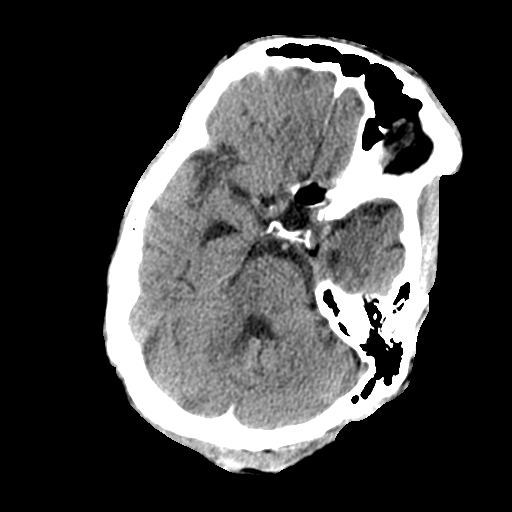
[im 11/31  brain]
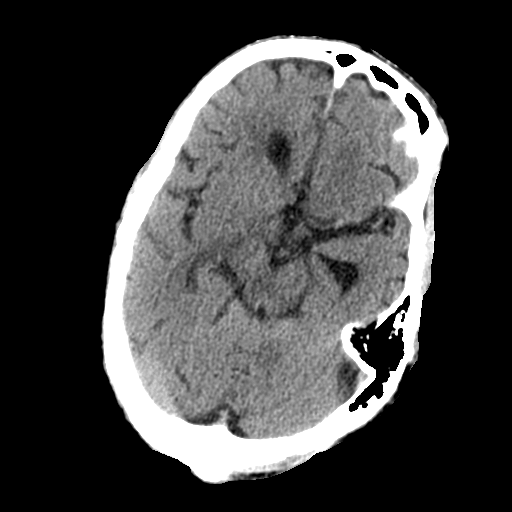
[im 14/31  brain]
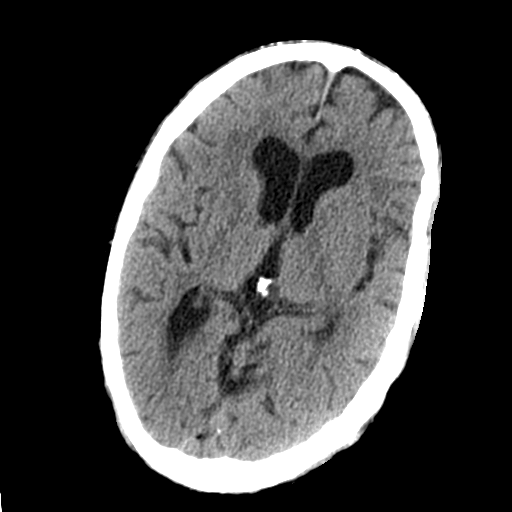
[im 14/31  bone]
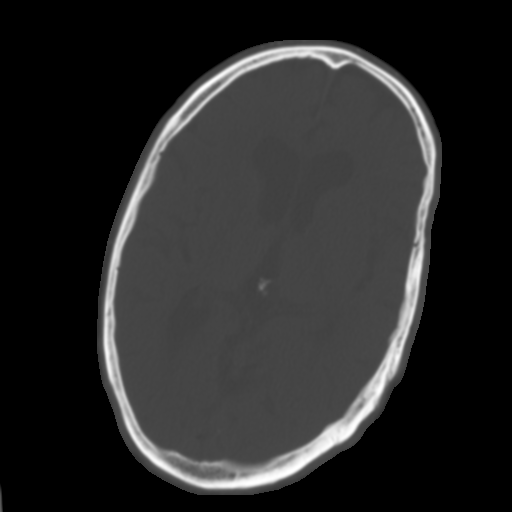
[im 17/31  brain]
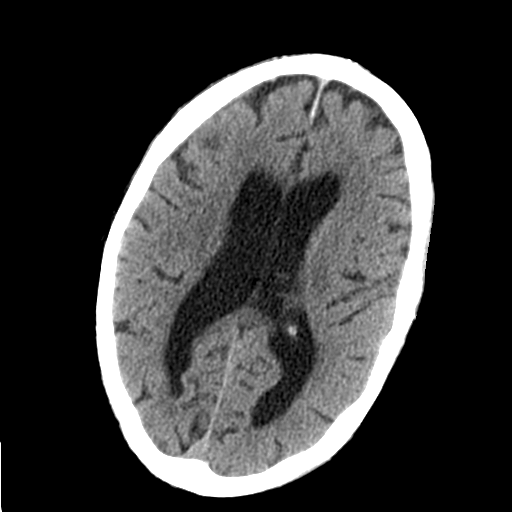
[im 20/31  brain]
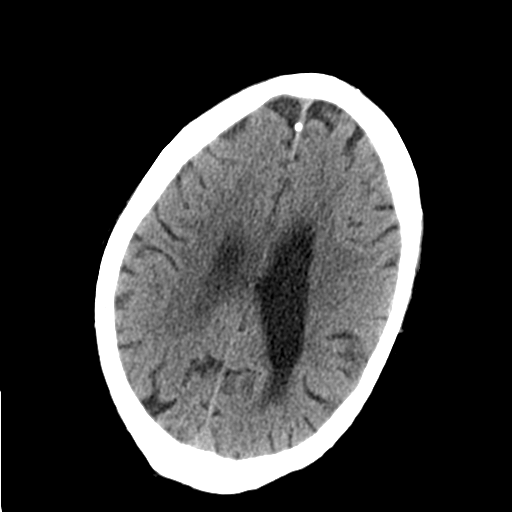
[im 23/31  brain]
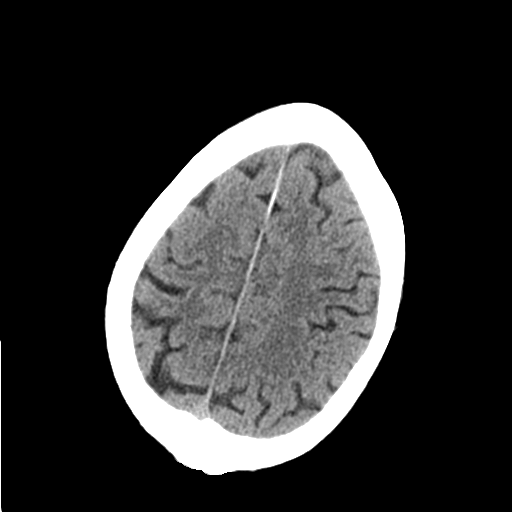
[im 25/31  brain]
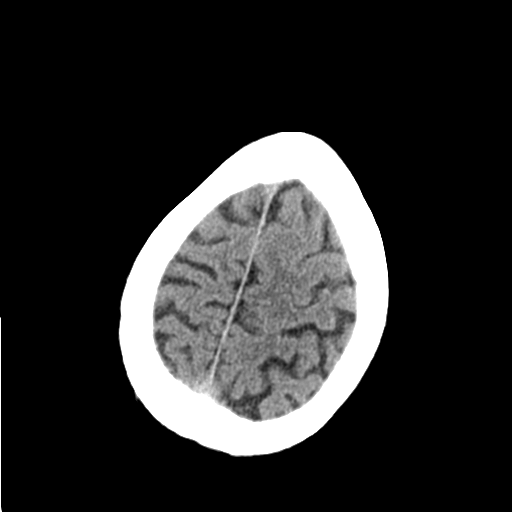
[im 25/31  bone]
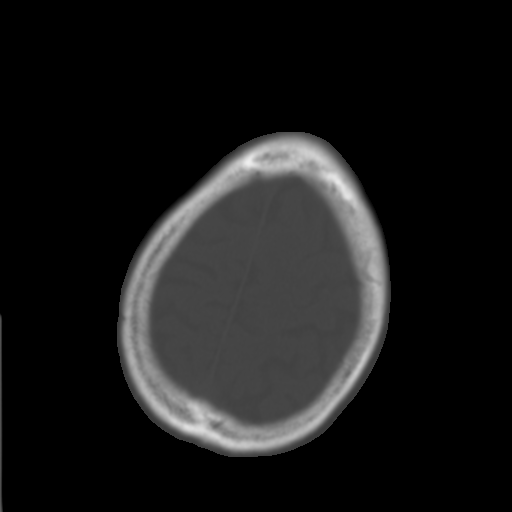
[im 28/31  brain]
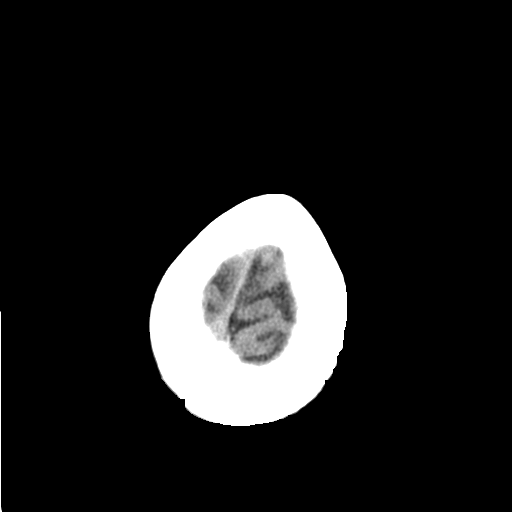

[Series 5: coronal soft tissue · coronal · 0.30mm/px · 3 of 70 slices shown]
[im 24/70  brain]
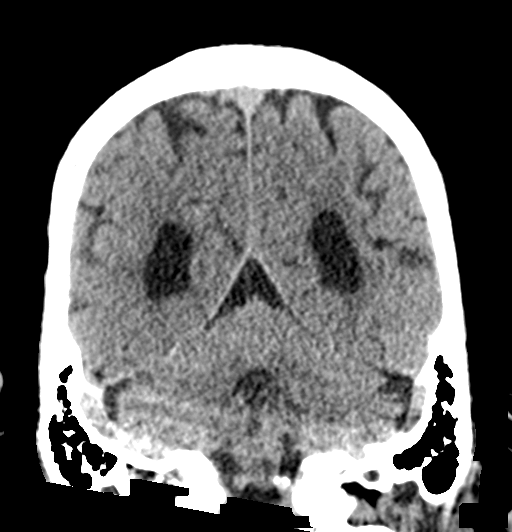
[im 31/70  brain]
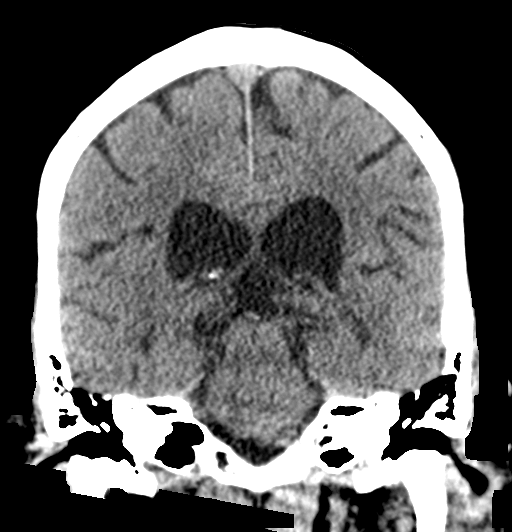
[im 39/70  brain]
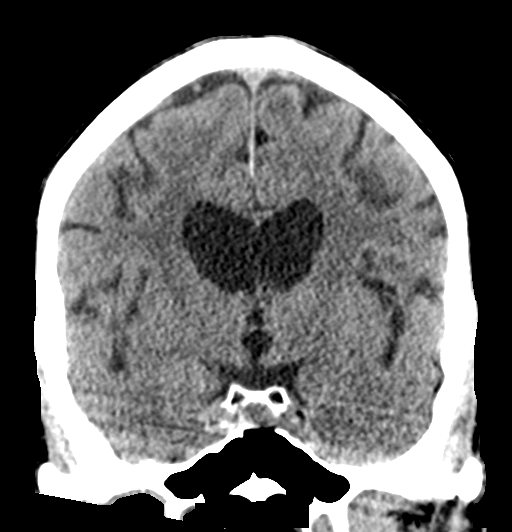

[Series 6: sagittal soft tissue · sagittal · 0.31mm/px · 3 of 51 slices shown]
[im 18/51  brain]
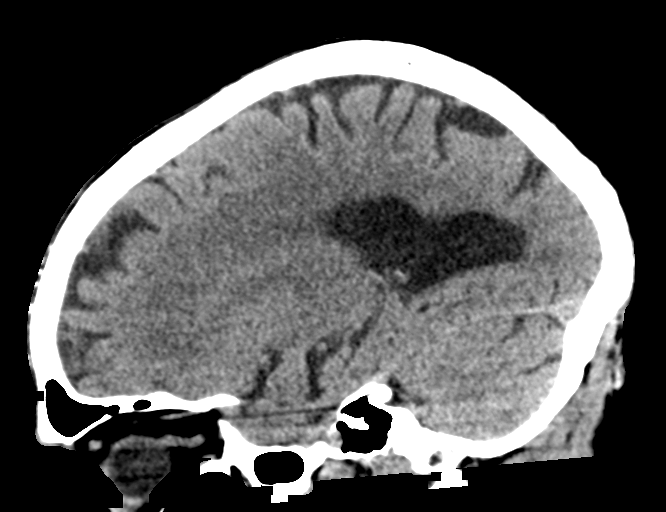
[im 26/51  brain]
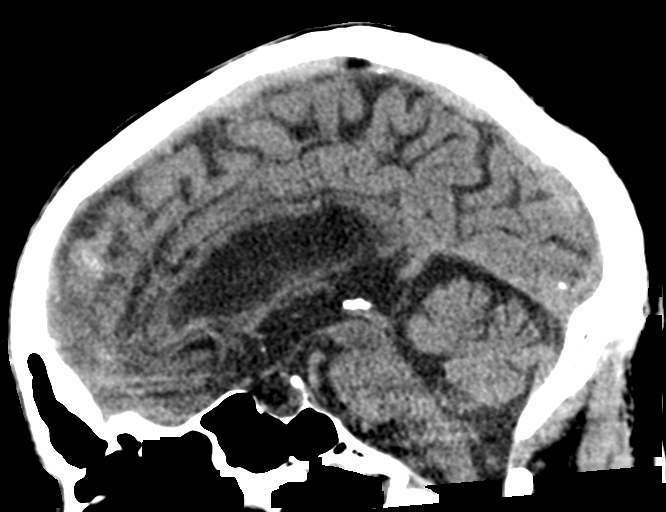
[im 34/51  brain]
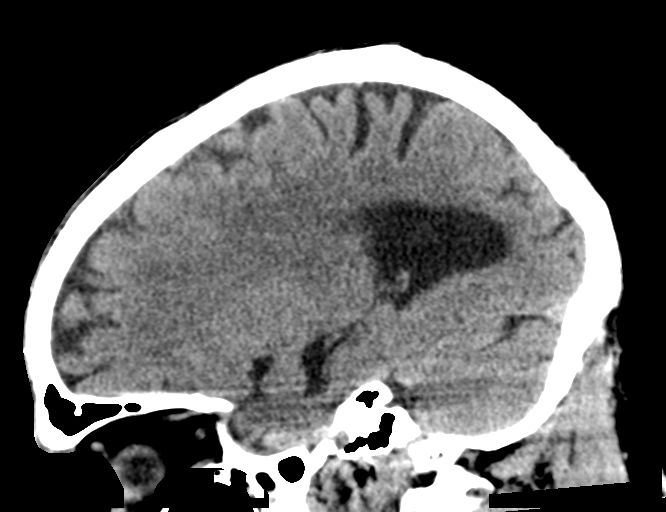

[16 of 47 positions shown; findings below may reference images not displayed]

FINDINGS: Brain: No evidence of acute infarction, hemorrhage, hydrocephalus,
extra-axial collection or mass lesion/mass effect.

Vascular: No hyperdense vessel or unexpected calcification.

Skull: Normal. Negative for fracture or focal lesion.

Sinuses/Orbits: No acute finding.

Other: None.
IMPRESSION: Normal head CT.

## 2019-12-08 IMAGING — CR DG HIP (WITH OR WITHOUT PELVIS) 2-3V*L*
4 series · 4 of 4 positions shown · non-contrast
Comparison: None.

CLINICAL DATA: Fall, left hip pain

EXAM:
DG HIP (WITH OR WITHOUT PELVIS) 2-3V LEFT

[x hip ap left]
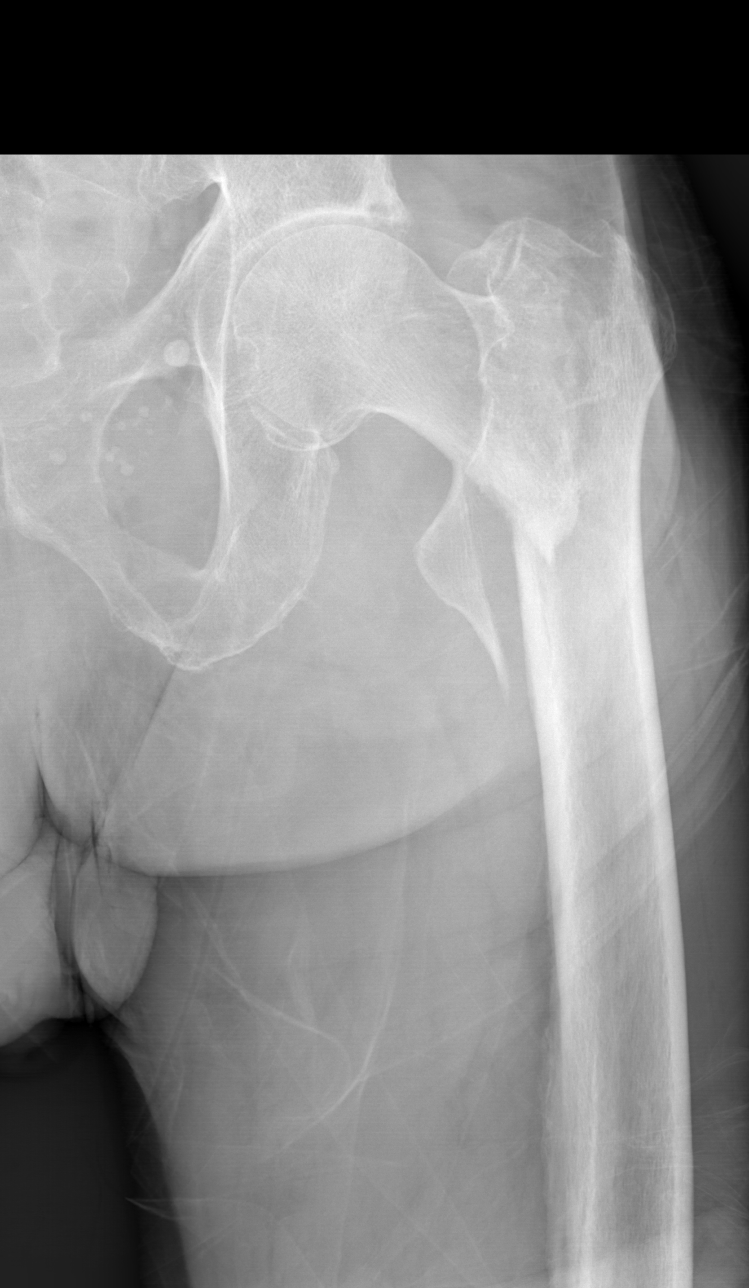

[x pelvis]
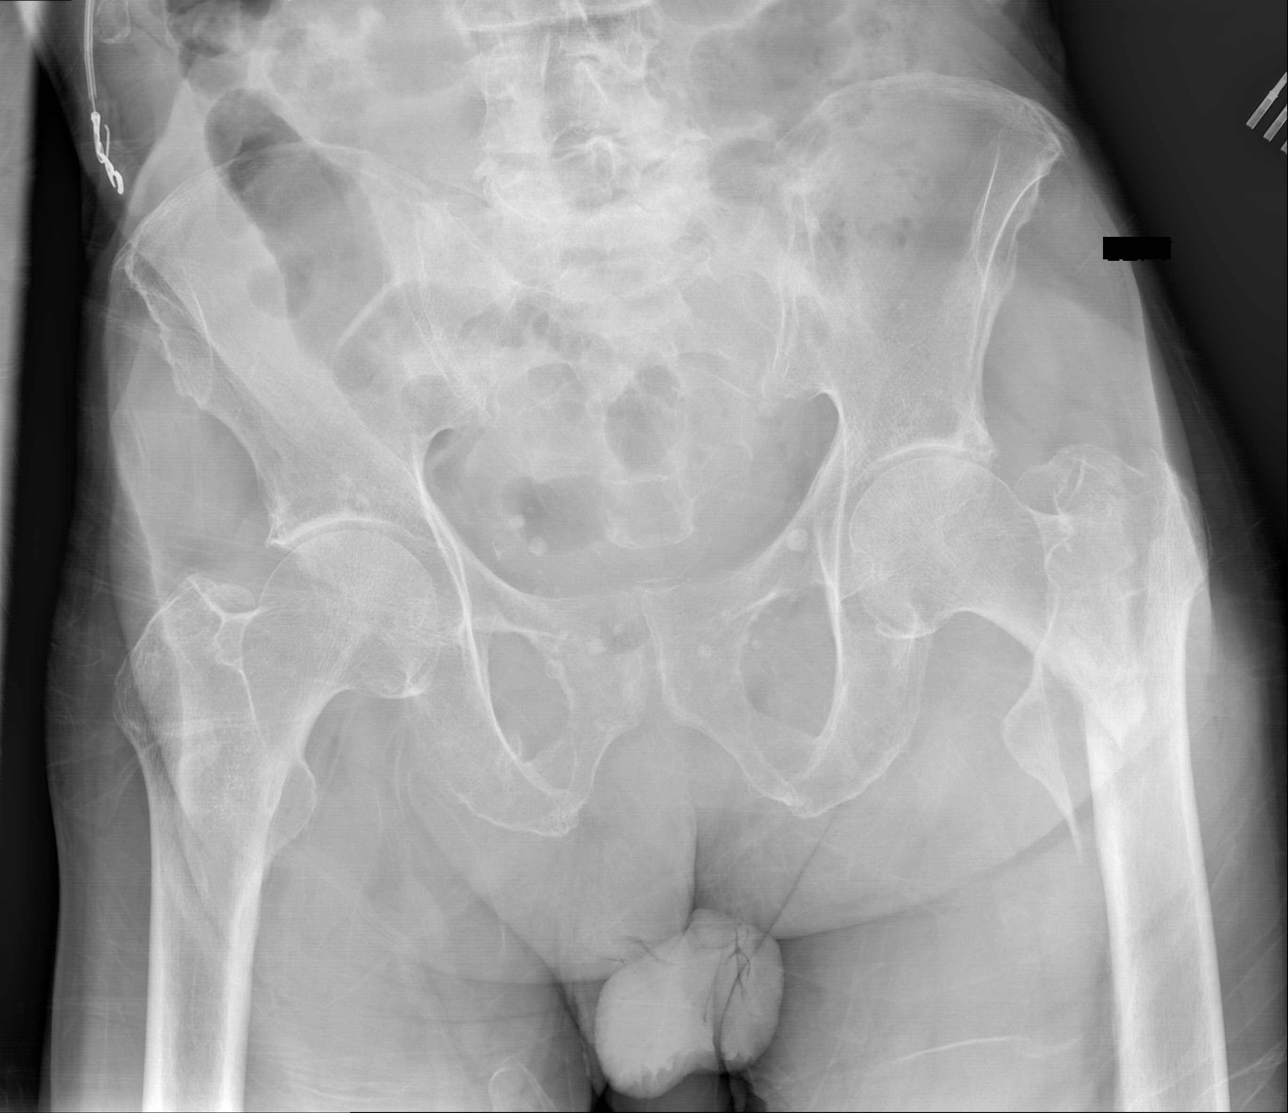

[w hip lat left]
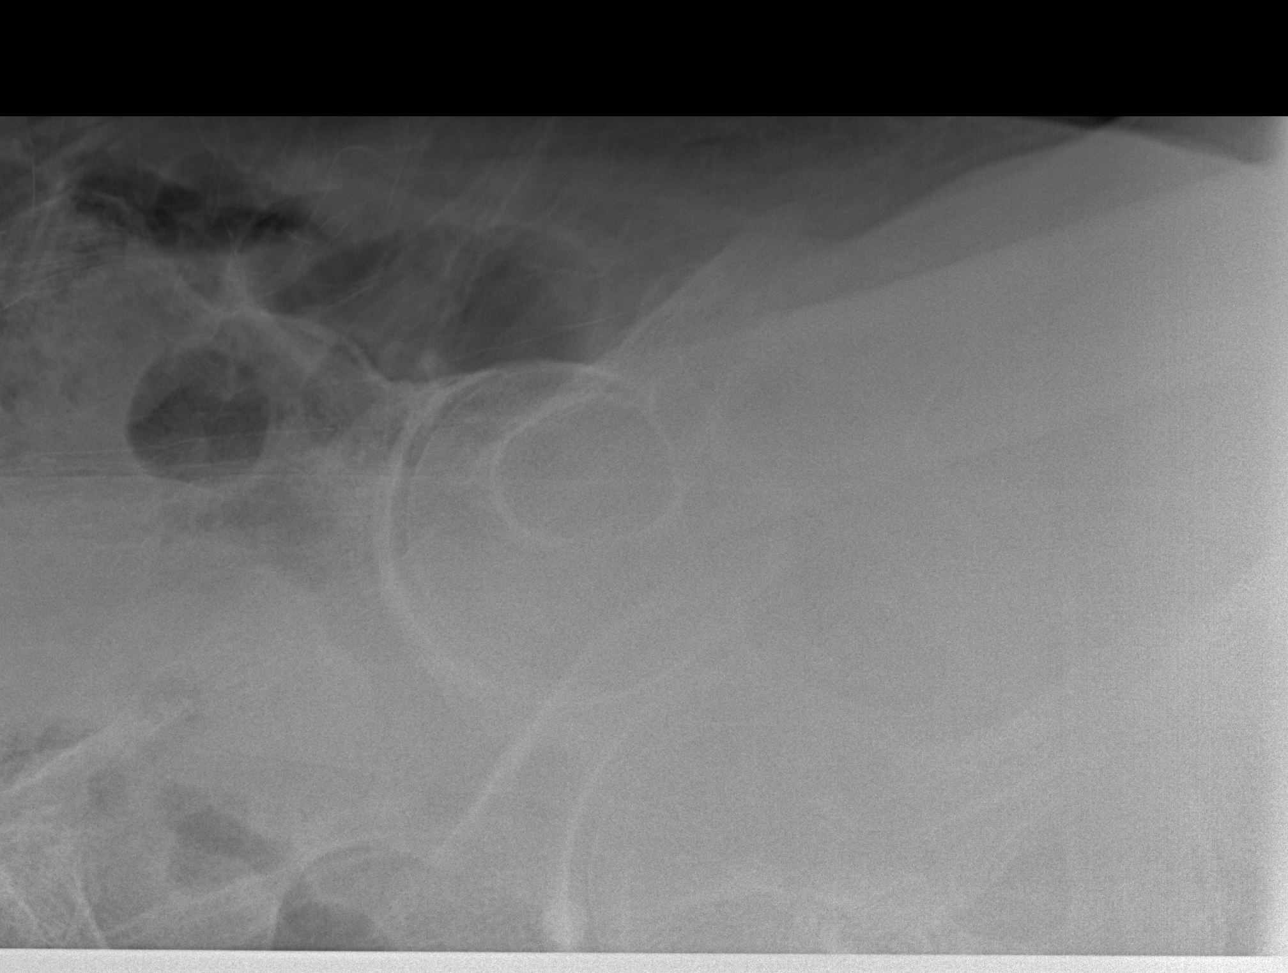

[x hip lat left]
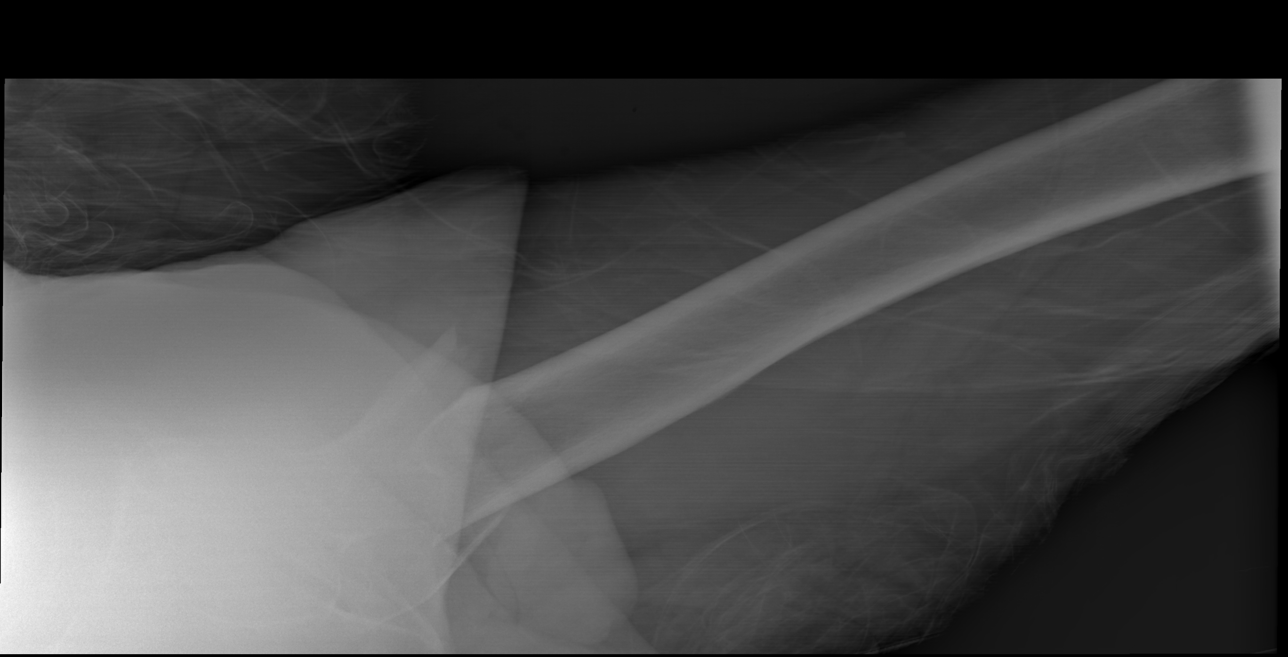

[4 of 4 positions shown; findings below may reference images not displayed]

FINDINGS: Single view radiograph pelvis and two view radiograph left hip
demonstrates an acute, mildly comminuted intratrochanteric fracture
of the left hip with avulsion of the lesser trochanter, mild
override of the major fracture fragments, and mild varus angulation.
The femoral head is still seated within the left acetabulum. There
is mild superimposed bilateral degenerative hip arthritis with joint
space narrowing noted. The pelvis and sacrum are intact. Limited
evaluation of the right hip is unremarkable.
IMPRESSION: Comminuted, angulated left intratrochanteric hip fracture with
avulsion of the lesser trochanter.

## 2019-12-08 MED ORDER — HYDROMORPHONE HCL 1 MG/ML IJ SOLN
0.5000 mg | INTRAMUSCULAR | Status: DC | PRN
  Administered 2019-12-08 – 2019-12-10 (×8): 1 mg via INTRAVENOUS
  Filled 2019-12-08 (×8): qty 1

## 2019-12-08 MED ORDER — HYDROCODONE-ACETAMINOPHEN 5-325 MG PO TABS
1.0000 | ORAL_TABLET | ORAL | Status: DC | PRN
  Administered 2019-12-09 – 2019-12-11 (×8): 1 via ORAL
  Filled 2019-12-08 (×8): qty 1

## 2019-12-08 MED ORDER — CAPECITABINE 500 MG PO TABS
1500.0000 mg | ORAL_TABLET | ORAL | Status: DC
Start: 2019-12-09 — End: 2019-12-10

## 2019-12-08 MED ORDER — LEVETIRACETAM IN NACL 1000 MG/100ML IV SOLN
1000.0000 mg | Freq: Once | INTRAVENOUS | Status: AC
  Administered 2019-12-08: 1000 mg via INTRAVENOUS
  Filled 2019-12-08: qty 100

## 2019-12-08 MED ORDER — ONDANSETRON HCL 4 MG PO TABS: 4.0000 mg | ORAL_TABLET | Freq: Four times a day (QID) | ORAL | Status: DC | PRN

## 2019-12-08 MED ORDER — LEVETIRACETAM 500 MG PO TABS
1000.0000 mg | ORAL_TABLET | Freq: Two times a day (BID) | ORAL | Status: DC
  Administered 2019-12-08 – 2019-12-09 (×3): 1000 mg via ORAL
  Filled 2019-12-08 (×3): qty 2

## 2019-12-08 MED ORDER — SODIUM CHLORIDE 0.9 % IV BOLUS
500.0000 mL | Freq: Once | INTRAVENOUS | Status: AC
  Administered 2019-12-08: 500 mL via INTRAVENOUS

## 2019-12-08 MED ORDER — ONDANSETRON HCL 4 MG/2ML IJ SOLN: 4.0000 mg | Freq: Four times a day (QID) | INTRAMUSCULAR | Status: DC | PRN

## 2019-12-08 MED ORDER — HEPARIN SODIUM (PORCINE) 5000 UNIT/ML IJ SOLN
5000.0000 [IU] | Freq: Three times a day (TID) | INTRAMUSCULAR | Status: DC
  Administered 2019-12-09 – 2019-12-11 (×5): 5000 [IU] via SUBCUTANEOUS
  Filled 2019-12-08 (×7): qty 1

## 2019-12-08 MED ORDER — ZOLPIDEM TARTRATE 5 MG PO TABS
5.0000 mg | ORAL_TABLET | Freq: Every evening | ORAL | Status: DC | PRN
  Administered 2019-12-10: 5 mg via ORAL
  Filled 2019-12-08: qty 1

## 2019-12-08 MED ORDER — LIDOCAINE (ANORECTAL) 50 MG RE SUPP: 50.0000 mg | Freq: Three times a day (TID) | RECTAL | Status: DC | PRN

## 2019-12-08 MED ORDER — LORAZEPAM 2 MG/ML IJ SOLN
INTRAMUSCULAR | Status: AC
  Filled 2019-12-08: qty 1

## 2019-12-08 MED ORDER — POTASSIUM CHLORIDE 20 MEQ PO PACK
40.0000 meq | PACK | Freq: Once | ORAL | Status: AC
  Administered 2019-12-08: 40 meq via ORAL
  Filled 2019-12-08: qty 2

## 2019-12-08 NOTE — H&P (Signed)
TRH H&P    Patient Demographics:    Carlos [name], is a 74 y.o. male  MRN: [id number]  DOB - [birth year]  Admit Date - 12/08/2019  Referring MD/NP/PA: Ralene Bathe  Outpatient Primary MD for the patient is [name], MD  Patient coming from: Home  Chief complaint- Seizure   HPI:    Carlos [name]  is a 74 y.o. male, no history of seizures, mild neurocognitive disorder of unclear etiology, rectal adenocarcinoma, BPH, and anxiety presents to the ED with a chief complaint of seizure.  Patient himself is not able to provide a history as he does not remember the events.  Sister at bedside was also not present, but reports that people walked into the room to find him somnolent with blood in his mouth.  He was supposed to have been napping after his radiation treatment today.  They suspected that he had a seizure so they called EMS to come into the ER.  While in the ER patient had another witnessed seizure.  ED provider reports tonic-clonic activity for 1 minute, Ativan was given and then he had contracture or spasm of his right side for another 10 minutes.  Patient remained nonverbal for 2 hours after this is post physical state and at the time of admission he is still somnolent and a little confused.  Patient was put on seizure medication in August 2021 when he developed seizure activity during chemo.  He was started on Keppra 1 g twice daily.  He and his family are very strict with his meds and he has not missed any doses.  Patient has been having trouble sleeping lately due to rectal pain, especially after radiation treatments.  Possibly the seizure was triggered by sleep deprivation.  In the ED Temperature 98.1, heart rate 90, respiratory 20, blood pressure 123/74 CT head was done and was a normal head CT. Initial lab work showed a elevated ammonia 182, repeat was 24 Mag was 2.6 Neuro was consulted and advised admission to  [facility] where they can consult.  They advised 2 g loading dose of Keppra in the ER tonight, and then to continue his 1 g twice daily after that. And patient CHEM panel he did have a hypokalemia 3.0    Review of systems:    In addition to the HPI above,  No Fever-chills, No Headache, No changes with Vision or hearing, No problems swallowing food or Liquids, No Chest pain, Cough or Shortness of Breath, Difficulty regulating bowel swinging between constipation and diarrhea No Blood in stool or Urine, No dysuria, No new skin rashes or bruises, No new joints pains-aches,  No new weakness, tingling, numbness in any extremity, No polyuria, polydypsia or polyphagia, No significant Mental Stressors.  All other systems reviewed and are negative.    Past History of the following :    Past Medical History:  Diagnosis Date  . Anxiety   . Benign prostatic hyperplasia 03/30/2013   10/1 IMO update  . Cancer ([place])   . 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  Seizures ([place])       Past Surgical History:  Procedure Laterality Date  . PORTACATH PLACEMENT N/A 07/01/2019   Procedure: PORT ULTRASOUND GUIDED PLACEMENT;  Surgeon: Alphonsa Overall, MD;  Location: [facility];  Service: General;  Laterality: N/A;  . PROSTATE SURGERY  2004  . TONSILECTOMY, ADENOIDECTOMY, BILATERAL MYRINGOTOMY AND TUBES        Social History:      Social History   Tobacco Use  . Smoking status: Never Smoker  . Smokeless tobacco: Never Used  Substance Use Topics  . Alcohol use: No       Family History :     Family History  Problem Relation Age of Onset  . Osteoporosis Mother   . Dementia Mother   . Diabetes Mother   . Brain cancer Father      Home Medications:   Prior to Admission medications   Medication Sig Start Date End Date Taking? Authorizing Provider  bismuth subsalicylate (PEPTO BISMOL) 262 MG/15ML suspension Take 30 mLs by mouth every 6 (six) hours as needed for  indigestion.   Yes [provider]  HYDROcodone-Acetaminophen 5-300 MG TABS Take 1 tablet by mouth every 8 (eight) hours as needed (for severe pain as needed). 10/09/19  Yes Truitt Merle, MD  levETIRAcetam (KEPPRA) 1000 MG tablet Take 1 tablet (1,000 mg total) by mouth 2 (two) times daily. 09/23/19  Yes Cameron Sprang, MD  lidocaine-prilocaine (EMLA) cream Apply 1 application topically daily as needed (pain).  11/19/19  Yes [provider]  Multiple Vitamin (MULTIVITAMIN WITH MINERALS) TABS tablet Take 1 tablet by mouth daily.   Yes [provider]  OVER THE COUNTER MEDICATION Take 1 tablet by mouth daily. Mind work -  Supplement for brain   Yes [provider]  traMADol (ULTRAM) 50 MG tablet Take 1 tablet (50 mg total) by mouth every 6 (six) hours as needed for moderate pain. 11/23/19 11/22/20 Yes Alla Feeling, NP  capecitabine (XELODA) 500 MG tablet Take 3 tablets (1,500 mg total) by mouth 2 (two) times daily after a meal. Take on days of radiation, Monday through Friday Patient taking differently: Take 500 mg by mouth See admin instructions. Takes 3 tablets in the morning and 3 tablets at night .Take on days of radiation, Monday through Friday. 11/18/19   Truitt Merle, MD  Lidocaine, Anorectal, 50 MG SUPP Place 50 mg rectally every 8 (eight) hours as needed. Patient not taking: Reported on 12/08/2019 10/19/19   Truitt Merle, MD  potassium chloride SA (KLOR-CON) 20 MEQ tablet Take 1 tablet (20 mEq total) by mouth daily. Patient not taking: Reported on 12/08/2019 11/23/19   Alla Feeling, NP     Allergies:    No Known Allergies   Physical Exam:   Vitals  Blood pressure 124/72, pulse 98, temperature 98.1 F (36.7 C), temperature source Oral, resp. rate (!) 21, SpO2 99 %.  1.  General: Patient lying left lateral decubitus in bed in no acute distress  2. Psychiatric: Patient is somnolent, confused, fixated on getting his medications in a routine for with his  bowels will be regulated  3. Neurologic: Cranial nerves II through XII intact, moves all 4 extremities voluntarily, coordination intact, equal sensation upper and lower extremities bilaterally, remains somnolent  4. HEENMT:  Head is atraumatic, normocephalic, pupils are reactive to light, neck is supple, trachea is midline, dried blood on lip, no deep lacerations noted on tongue or lips  5. Respiratory : Lungs are clear to  auscultation bilaterally  6. Cardiovascular : Heart rate is tachycardic, rhythm is regular, no murmurs rubs or gallops No peripheral edema  7. Gastrointestinal:  Abdomen is soft, nondistended, nontender to palpation  8. Skin:  No acute lesions on limited skin exam  9.Musculoskeletal:  No acute deformity, or calf tenderness    Data Review:    CBC Recent Labs  Lab 12/07/19 1005 12/08/19 1708  WBC 3.7* 5.8  HGB 12.2* 13.3  HCT 36.0* 43.0  PLT 181 229  MCV 91.4 99.8  MCH 31.0 30.9  MCHC 33.9 30.9  RDW 15.9* 16.5*  LYMPHSABS 0.5* 0.9  MONOABS 0.5 0.6  EOSABS 0.2 0.0  BASOSABS 0.0 0.0   ------------------------------------------------------------------------------------------------------------------  Results for orders placed or performed during the hospital encounter of 12/08/19 (from the past 48 hour(s))  CBC with Differential     Status: Abnormal   Collection Time: 12/08/19  5:08 PM  Result Value Ref Range   WBC 5.8 4.0 - 10.5 K/uL   RBC 4.31 4.22 - 5.81 MIL/uL   Hemoglobin 13.3 13.0 - 17.0 g/dL   HCT 43.0 39 - 52 %   MCV 99.8 80.0 - 100.0 fL   MCH 30.9 26.0 - 34.0 pg   MCHC 30.9 30.0 - 36.0 g/dL   RDW 16.5 (H) 11.5 - 15.5 %   Platelets 229 150 - 400 K/uL   nRBC 0.0 0.0 - 0.2 %   Neutrophils Relative % 72 %   Neutro Abs 4.2 1.7 - 7.7 K/uL   Lymphocytes Relative 16 %   Lymphs Abs 0.9 0.7 - 4.0 K/uL   Monocytes Relative 11 %   Monocytes Absolute 0.6 0.1 - 1.0 K/uL   Eosinophils Relative 0 %   Eosinophils Absolute 0.0 0.0 - 0.5 K/uL    Basophils Relative 0 %   Basophils Absolute 0.0 0.0 - 0.1 K/uL   Immature Granulocytes 1 %   Abs Immature Granulocytes 0.03 0.00 - 0.07 K/uL    Comment: Performed at [facility], [place] [address], [place], [place] [address]  Comprehensive metabolic panel     Status: Abnormal   Collection Time: 12/08/19  5:08 PM  Result Value Ref Range   Sodium 145 135 - 145 mmol/L   Potassium 3.0 (L) 3.5 - 5.1 mmol/L   Chloride 102 98 - 111 mmol/L   CO2 17 (L) 22 - 32 mmol/L   Glucose, Bld 136 (H) 70 - 99 mg/dL    Comment: Glucose reference range applies only to samples taken after fasting for at least 8 hours.   BUN 13 8 - 23 mg/dL   Creatinine, Ser 0.92 0.61 - 1.24 mg/dL   Calcium 10.0 8.9 - 10.3 mg/dL   Total Protein 8.4 (H) 6.5 - 8.1 g/dL   Albumin 4.9 3.5 - 5.0 g/dL   AST 36 15 - 41 U/L   ALT 28 0 - 44 U/L   Alkaline Phosphatase 92 38 - 126 U/L   Total Bilirubin 0.8 0.3 - 1.2 mg/dL   GFR, Estimated >60 >60 mL/min    Comment: (NOTE) Calculated using the CKD-EPI Creatinine Equation (2021)    Anion gap 26 (H) 5 - 15    Comment: REPEATED TO VERIFY Performed at [facility] [address] [place], [place] [address]   Ammonia     Status: Abnormal   Collection Time: 12/08/19  5:08 PM  Result Value Ref Range   Ammonia 182 (H) 9 - 35 umol/L    Comment: Performed  at [facility], [place] [address], [place], [place] [address]  CBG monitoring, ED     Status: Abnormal   Collection Time: 12/08/19  5:46 PM  Result Value Ref Range   Glucose-Capillary 117 (H) 70 - 99 mg/dL    Comment: Glucose reference range applies only to samples taken after fasting for at least 8 hours.  Magnesium     Status: Abnormal   Collection Time: 12/08/19  5:53 PM  Result Value Ref Range   Magnesium 2.6 (H) 1.7 - 2.4 mg/dL    Comment: Performed at [facility], [place] [address] [place], [place] [address]  Ammonia     Status: None   Collection Time:  12/08/19  8:14 PM  Result Value Ref Range   Ammonia 24 9 - 35 umol/L    Comment: Performed at [facility], [place] [address] [place], [place] [address]    Chemistries  Recent Labs  Lab 12/07/19 1005 12/08/19 1708 12/08/19 1753  NA 139 145  --   K 4.2 3.0*  --   CL 105 102  --   CO2 28 17*  --   GLUCOSE 105* 136*  --   BUN 9 13  --   CREATININE 0.76 0.92  --   CALCIUM 9.6 10.0  --   MG  --   --  2.6*  AST 31 36  --   ALT 24 28  --   ALKPHOS 87 92  --   BILITOT 0.6 0.8  --    ------------------------------------------------------------------------------------------------------------------  ------------------------------------------------------------------------------------------------------------------ GFR: Estimated Creatinine Clearance: 59.5 mL/min (by C-G formula based on SCr of 0.92 mg/dL). Liver Function Tests: Recent Labs  Lab 12/07/19 1005 12/08/19 1708  AST 31 36  ALT 24 28  ALKPHOS 87 92  BILITOT 0.6 0.8  PROT 7.2 8.4*  ALBUMIN 4.0 4.9   No results for input(s): LIPASE, AMYLASE in the last 168 hours. Recent Labs  Lab 12/08/19 1708 12/08/19 2014  AMMONIA 182* 24   Coagulation Profile: No results for input(s): INR, PROTIME in the last 168 hours. Cardiac Enzymes: No results for input(s): CKTOTAL, CKMB, CKMBINDEX, TROPONINI in the last 168 hours. BNP (last 3 results) No results for input(s): PROBNP in the last 8760 hours. HbA1C: No results for input(s): HGBA1C in the last 72 hours. CBG: Recent Labs  Lab 12/08/19 1746  GLUCAP 117*   Lipid Profile: No results for input(s): CHOL, HDL, LDLCALC, TRIG, CHOLHDL, LDLDIRECT in the last 72 hours. Thyroid Function Tests: No results for input(s): TSH, T4TOTAL, FREET4, T3FREE, THYROIDAB in the last 72 hours. Anemia Panel: No results for input(s): VITAMINB12, FOLATE, FERRITIN, TIBC, IRON, RETICCTPCT in the last 72  hours.  --------------------------------------------------------------------------------------------------------------- Urine analysis:    Component Value Date/Time   COLORURINE YELLOW 09/11/2019 1127   APPEARANCEUR CLOUDY (A) 09/11/2019 1127   LABSPEC 1.017 09/11/2019 1127   PHURINE 5.0 09/11/2019 1127   GLUCOSEU NEGATIVE 09/11/2019 1127   HGBUR NEGATIVE 09/11/2019 1127   [place] 09/11/2019 1127   KETONESUR NEGATIVE 09/11/2019 1127   PROTEINUR 30 (A) 09/11/2019 1127   NITRITE NEGATIVE 09/11/2019 1127   LEUKOCYTESUR NEGATIVE 09/11/2019 1127      Imaging Results:    CT Head Wo Contrast  Result Date: 12/08/2019 CLINICAL DATA:  Altered mental status. EXAM: CT HEAD WITHOUT CONTRAST TECHNIQUE: Contiguous axial images were obtained from the base of the skull through the vertex without intravenous contrast. COMPARISON:  September 11, 2019. FINDINGS: Brain: No evidence of acute  infarction, hemorrhage, hydrocephalus, extra-axial collection or mass lesion/mass effect. Vascular: No hyperdense vessel or unexpected calcification. Skull: Normal. Negative for fracture or focal lesion. Sinuses/Orbits: No acute finding. Other: None. IMPRESSION: Normal head CT. Electronically Signed   By: Marijo Conception M.D.   On: 12/08/2019 18:27    My personal review of EKG: Rhythm NSR, Rate 103 /min, QTc 443 ,no Acute ST changes   Assessment & Plan:    Active Problems:   Breakthrough seizure ([place])   1. Breakthrough seizure 1. Patient developed seizures while in chemo in 09/2019 2. He has been compliant with his keppra 1g BID 3. Today, he was found somnolent with blood in his mouth - suspected seizure 4. 2nd witnessed seizure in ED 5. Neuro advises, load with 2 g Keppra, continue 1g BID, and transfer to [facility] for neuro consult 6. Continue seizure precautions 2. Hypokalemia 1. Replace and recheck 3. Hyperammonemia 1. Initially 182, and then 24 2. Neuro had advised an MRI brain if ammonia remained  high, since it normalized without intervention - no MRI 3. Possible lab error with first read, or more likely -related to seizure 4. Rectal adenocarcinoma 1. Continue home antineoplastic agent 2. Continue to monitor 5. Anion gap and low bicarb 1. Likely acidotic state 2/2 seizure 2. Lactic acid pending 3. Continue to monitor    DVT Prophylaxis-   heparin - SCDs   AM Labs Ordered, also please review Full Orders  Family Communication: Admission, patients condition and plan of care including tests being ordered have been discussed with the patient and sister who indicate understanding and agree with the plan and Code Status.  Code Status:  FULL  Admission status: Observation  Time spent in minutes : [place]

## 2019-12-08 NOTE — ED Notes (Signed)
Mel Almond, RN walked in patients room and found patient on the floor. RN called for staff help. This writer,Miquela NT, and Harrell Gave, RN helped get patient back in bed. Pt began stating that he decided to get off the bed and walk because he normally can walk and that he has never done anything like this before. Staff got patient back onto bed and got patient cleaned up. There was stool all on the floor and patient pulled off monitoring cords and condom catheter.  EDP Ralene Bathe came in and talked to patient. Pt stated that his leg is hurting. Pt continue to state "I normally can walk by myself I do not do this."

## 2019-12-08 NOTE — ED Notes (Signed)
This nurse went in the room to medicate the patient and found him laying on the floor with stool all over himself and the floor. This nurse called for help and then assisted the patient back to the bed with Sunday Spillers NT and [place] NT. Dr. Ralene Bathe and Dr. Clearence Ped were notified. Dr. Ralene Bathe assessed the patient. Patient is now complaining of left sided hip and leg pain. X-rays have been ordered and ice has been applied to the area.

## 2019-12-08 NOTE — ED Notes (Signed)
While accessing the port the patient suddenly started falling back and began seizing. This nurse and Max, NT, laid the patient back and protected his head and airway. Pt's whole body shook and he had some coughing. Pt was turned on his side and suctioned. Pt did bite his tongue and had some blood in his mouth. The seizure lasted approximately 1 minute. Pt's oxygen saturation remained 99% on RA.

## 2019-12-08 NOTE — ED Provider Notes (Signed)
[facility] DEPT Provider Note   CSN: [id number] Arrival date & time: 12/08/19  1657     History Chief Complaint  Patient presents with  . Seizures    Carlos [name] is a 74 y.o. male.  The history is provided by the patient, the EMS personnel and medical records.   Carlos [name] is a 74 y.o. male who presents to the Emergency Department complaining of altered mental status. Level V caveat due to confusion. He presents the emergency department by EMS for evaluation after being difficulty awakened from a nap. He lay down for a nap around 330 this afternoon and at 4 o'clock when family came to awaken him and he was difficult to arouse. On EMS arrival he was awake, GCS of 15. He has a history of colon cancer and did receive treatment today. Per report family did find some blood in his mouth. EMS denies blood in the mouth on their arrival.    Past Medical History:  Diagnosis Date  . Anxiety   . Benign prostatic hyperplasia 03/30/2013   10/1 IMO update  . Cancer ([place])   . 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 Seizures [facility])     Patient Active Problem List   Diagnosis Date Noted  . 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 Acute encephalopathy 09/11/2019  . Fever 09/11/2019  . Port-A-Cath in place 09/11/2019  . 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 Benign prostatic hyperplasia 03/30/2013    Past Surgical History:  Procedure Laterality Date  . PORTACATH PLACEMENT N/A 07/01/2019   Procedure: PORT ULTRASOUND GUIDED PLACEMENT;  Surgeon: Alphonsa Overall, MD;  Location: [facility] ORS;  Service: General;  Laterality: N/A;  . PROSTATE SURGERY  2004  . TONSILECTOMY, ADENOIDECTOMY, BILATERAL MYRINGOTOMY AND TUBES         Family History  Problem Relation Age of Onset  . Osteoporosis Mother   . Dementia Mother   . Diabetes Mother   . Brain cancer Father      Social History   Tobacco Use  . Smoking status: Never Smoker  . Smokeless tobacco: Never Used  Vaping Use  . Vaping Use: Never used  Substance Use Topics  . Alcohol use: No  . Drug use: No    Home Medications Prior to Admission medications   Medication Sig Start Date End Date Taking? Authorizing Provider  bismuth subsalicylate (PEPTO BISMOL) 262 MG/15ML suspension Take 30 mLs by mouth every 6 (six) hours as needed for indigestion.   Yes [provider]  HYDROcodone-Acetaminophen 5-300 MG TABS Take 1 tablet by mouth every 8 (eight) hours as needed (for severe pain as needed). 10/09/19  Yes Truitt Merle, MD  levETIRAcetam (KEPPRA) 1000 MG tablet Take 1 tablet (1,000 mg total) by mouth 2 (two) times daily. 09/23/19  Yes Cameron Sprang, MD  lidocaine-prilocaine (EMLA) cream Apply 1 application topically daily as needed (pain).  11/19/19  Yes [provider]  Multiple Vitamin (MULTIVITAMIN WITH MINERALS) TABS tablet Take 1 tablet by mouth daily.   Yes [provider]  OVER THE COUNTER MEDICATION Take 1 tablet by mouth daily. Mind work -  Supplement for brain   Yes [provider]  traMADol (ULTRAM) 50 MG tablet Take 1 tablet (50 mg total) by mouth every 6 (six) hours as needed for moderate pain. 11/23/19 11/22/20 Yes Alla Feeling, NP  capecitabine (XELODA) 500 MG tablet Take 3 tablets (1,500 mg  total) by mouth 2 (two) times daily after a meal. Take on days of radiation, Monday through Friday Patient taking differently: Take 500 mg by mouth See admin instructions. Takes 3 tablets in the morning and 3 tablets at night .Take on days of radiation, Monday through Friday. 11/18/19   Truitt Merle, MD  Lidocaine, Anorectal, 50 MG SUPP Place 50 mg rectally every 8 (eight) hours as needed. Patient not taking: Reported on 12/08/2019 10/19/19   Truitt Merle, MD  potassium chloride SA (KLOR-CON) 20 MEQ tablet Take 1 tablet (20 mEq total) by mouth daily. Patient not taking:  Reported on 12/08/2019 11/23/19   Alla Feeling, NP    Allergies    Patient has no known allergies.  Review of Systems   Review of Systems  All other systems reviewed and are negative.   Physical Exam Updated Vital Signs BP 105/66   Pulse 92   Temp 98.1 F (36.7 C) (Oral)   Resp (!) 25   SpO2 100%   Physical Exam Vitals and nursing note reviewed.  Constitutional:      Appearance: He is well-developed.  HENT:     Head: Normocephalic and atraumatic.  Cardiovascular:     Rate and Rhythm: Normal rate and regular rhythm.     Heart sounds: No murmur heard.   Pulmonary:     Effort: Pulmonary effort is normal. No respiratory distress.     Breath sounds: Normal breath sounds.  Abdominal:     Palpations: Abdomen is soft.     Tenderness: There is no abdominal tenderness. There is no guarding or rebound.  Musculoskeletal:        General: No tenderness.  Skin:    General: Skin is warm and dry.  Neurological:     Mental Status: He is alert and oriented to person, place, and time.     Comments: Five out of five strength in all four extremities with sensation light touch intact in all four extremities. Perseverating questions.  Psychiatric:        Behavior: Behavior normal.     ED Results / Procedures / Treatments   Labs (all labs ordered are listed, but only abnormal results are displayed) Labs Reviewed  CBC WITH DIFFERENTIAL/PLATELET - Abnormal; Notable for the following components:      Result Value   RDW 16.5 (*)    All other components within normal limits  COMPREHENSIVE METABOLIC PANEL - Abnormal; Notable for the following components:   Potassium 3.0 (*)    CO2 17 (*)    Glucose, Bld 136 (*)    Total Protein 8.4 (*)    Anion gap 26 (*)    All other components within normal limits  AMMONIA - Abnormal; Notable for the following components:   Ammonia 182 (*)    All other components within normal limits  MAGNESIUM - Abnormal; Notable for the following components:    Magnesium 2.6 (*)    All other components within normal limits  CBG MONITORING, ED - Abnormal; Notable for the following components:   Glucose-Capillary 117 (*)    All other components within normal limits  RESPIRATORY PANEL BY RT PCR (FLU A&B, COVID)  AMMONIA  URINALYSIS, ROUTINE W REFLEX MICROSCOPIC    EKG None  Radiology CT Head Wo Contrast  Result Date: 12/08/2019 CLINICAL DATA:  Altered mental status. EXAM: CT HEAD WITHOUT CONTRAST TECHNIQUE: Contiguous axial images were obtained from the base of the skull through the vertex without intravenous contrast. COMPARISON:  September 11, 2019. FINDINGS: Brain: No evidence of acute infarction, hemorrhage, hydrocephalus, extra-axial collection or mass lesion/mass effect. Vascular: No hyperdense vessel or unexpected calcification. Skull: Normal. Negative for fracture or focal lesion. Sinuses/Orbits: No acute finding. Other: None. IMPRESSION: Normal head CT. Electronically Signed   By: Marijo Conception M.D.   On: 12/08/2019 18:27    Procedures Procedures (including critical care time) CRITICAL CARE Performed by: Quintella Reichert   Total critical care time: 35 minutes  Critical care time was exclusive of separately billable procedures and treating other patients.  Critical care was necessary to treat or prevent imminent or life-threatening deterioration.  Critical care was time spent personally by me on the following activities: development of treatment plan with patient and/or surrogate as well as nursing, discussions with consultants, evaluation of patient's response to treatment, examination of patient, obtaining history from patient or surrogate, ordering and performing treatments and interventions, ordering and review of laboratory studies, ordering and review of radiographic studies, pulse oximetry and re-evaluation of patient's condition.  Medications Ordered in ED Medications  LORazepam (ATIVAN) 2 MG/ML injection ( Intramuscular Given  12/08/19 1745)  levETIRAcetam (KEPPRA) IVPB 1000 mg/100 mL premix (0 mg Intravenous Stopped 12/08/19 1831)  sodium chloride 0.9 % bolus 500 mL (0 mLs Intravenous Stopped 12/08/19 2008)  levETIRAcetam (KEPPRA) IVPB 1000 mg/100 mL premix (0 mg Intravenous Stopped 12/08/19 2111)    ED Course  I have reviewed the triage vital signs and the nursing notes.  Pertinent labs & imaging results that were available during my care of the patient were reviewed by me and considered in my medical decision making (see chart for details).    MDM Rules/Calculators/A&P                         patient with history of colorectal cancer currently under going treatment here for evaluation of altered mental status. On ED presentation patient awake and alert with no focal neuro- deficits. Awaiting his complete workup patient was found to have seizure activity by nursing staff. Seizure activity lasted about one minute of generalized seizure. Following generalized seizure patient did have several minutes of persistent clenching of the right hand and arm and did not follow any commands. He was treated with 2 mg of Ativan IM. He was also loaded with Keppra. He was observed in for several hours after his seizure he continued to be persistently somnolent and nonverbal. Patient then slowly began to awaken to verbal commands but false right back asleep. Labs with mild hypokalemia. Initial ammonia is very elevated unclear of the significance. CT scan without acute abnormality. Discussed with Dr. Cheral Marker with neurology, who recommends 2 g Keppra load followed by Keppra 1 g BID PO. He also recommends rechecking ammonia to see if initial results is spurious. He recommends transfer to [facility] in the event patient needs continuous EEG. Hospitalist consulted for admission. Sr. updated findings and recommendation for mission and she is in agreement with treatment plan.  Final Clinical Impression(s) / ED Diagnoses Final diagnoses:  Seizures  [facility])    Rx / [place] Orders ED Discharge Orders    None       Quintella Reichert, MD 12/08/19 2122

## 2019-12-08 NOTE — ED Triage Notes (Addendum)
Pt arrived via [facility] from home. Pt's family states he took a nap and was difficult to wake up. Family reported blood in pt's mouth. EMS reports there was no blood present and has had a GCS of 15. Hx of colon cancer and had his last treatment today.   Vitals from EMS: HR: 104 RR: 22 O2: 99% BP: 136/90 CBG: 89

## 2019-12-09 ENCOUNTER — Observation Stay (HOSPITAL_COMMUNITY): Payer: PRIVATE HEALTH INSURANCE

## 2019-12-09 ENCOUNTER — Telehealth: Payer: Self-pay

## 2019-12-09 ENCOUNTER — Observation Stay (HOSPITAL_COMMUNITY)
Admit: 2019-12-09 | Discharge: 2019-12-09 | Disposition: A | Discharge status: Home / Self Care | Payer: PRIVATE HEALTH INSURANCE | Attending: Family Medicine | Admitting: Family Medicine

## 2019-12-09 ENCOUNTER — Ambulatory Visit: Payer: 59

## 2019-12-09 ENCOUNTER — Telehealth: Payer: Self-pay | Admitting: Radiation Oncology

## 2019-12-09 DIAGNOSIS — S72142A Displaced intertrochanteric fracture of left femur, initial encounter for closed fracture: Secondary | ICD-10-CM | POA: Diagnosis not present

## 2019-12-09 DIAGNOSIS — R569 Unspecified convulsions: Secondary | ICD-10-CM

## 2019-12-09 DIAGNOSIS — S72002A Fracture of unspecified part of neck of left femur, initial encounter for closed fracture: Secondary | ICD-10-CM

## 2019-12-09 DIAGNOSIS — G3184 Mild cognitive impairment, so stated: Secondary | ICD-10-CM | POA: Diagnosis not present

## 2019-12-09 DIAGNOSIS — D649 Anemia, unspecified: Secondary | ICD-10-CM | POA: Diagnosis not present

## 2019-12-09 DIAGNOSIS — S72122A Displaced fracture of lesser trochanter of left femur, initial encounter for closed fracture: Secondary | ICD-10-CM | POA: Diagnosis not present

## 2019-12-09 DIAGNOSIS — W06XXXA Fall from bed, initial encounter: Secondary | ICD-10-CM | POA: Diagnosis present

## 2019-12-09 DIAGNOSIS — G40919 Epilepsy, unspecified, intractable, without status epilepticus: Secondary | ICD-10-CM | POA: Diagnosis not present

## 2019-12-09 DIAGNOSIS — Y92230 Patient room in hospital as the place of occurrence of the external cause: Secondary | ICD-10-CM | POA: Diagnosis present

## 2019-12-09 DIAGNOSIS — G40909 Epilepsy, unspecified, not intractable, without status epilepticus: Secondary | ICD-10-CM | POA: Diagnosis not present

## 2019-12-09 DIAGNOSIS — E7220 Disorder of urea cycle metabolism, unspecified: Secondary | ICD-10-CM | POA: Diagnosis not present

## 2019-12-09 DIAGNOSIS — E872 Acidosis: Secondary | ICD-10-CM | POA: Diagnosis not present

## 2019-12-09 DIAGNOSIS — Z79899 Other long term (current) drug therapy: Secondary | ICD-10-CM | POA: Diagnosis not present

## 2019-12-09 DIAGNOSIS — F419 Anxiety disorder, unspecified: Secondary | ICD-10-CM | POA: Diagnosis not present

## 2019-12-09 DIAGNOSIS — L89329 Pressure ulcer of left buttock, unspecified stage: Secondary | ICD-10-CM | POA: Diagnosis not present

## 2019-12-09 DIAGNOSIS — C20 Malignant neoplasm of rectum: Secondary | ICD-10-CM | POA: Diagnosis not present

## 2019-12-09 DIAGNOSIS — E876 Hypokalemia: Secondary | ICD-10-CM | POA: Diagnosis not present

## 2019-12-09 DIAGNOSIS — L89319 Pressure ulcer of right buttock, unspecified stage: Secondary | ICD-10-CM | POA: Diagnosis not present

## 2019-12-09 DIAGNOSIS — N400 Enlarged prostate without lower urinary tract symptoms: Secondary | ICD-10-CM | POA: Diagnosis not present

## 2019-12-09 DIAGNOSIS — Z20822 Contact with and (suspected) exposure to covid-19: Secondary | ICD-10-CM | POA: Diagnosis not present

## 2019-12-09 LAB — URINALYSIS, ROUTINE W REFLEX MICROSCOPIC
Bilirubin Urine: NEGATIVE
Glucose, UA: NEGATIVE mg/dL
Hgb urine dipstick: NEGATIVE
Ketones, ur: NEGATIVE mg/dL
Leukocytes,Ua: NEGATIVE
Nitrite: NEGATIVE
Protein, ur: NEGATIVE mg/dL
Specific Gravity, Urine: 1.021 (ref 1.005–1.030)
pH: 5 (ref 5.0–8.0)

## 2019-12-09 LAB — COMPREHENSIVE METABOLIC PANEL
ALT: 26 U/L (ref 0–44)
AST: 28 U/L (ref 15–41)
Albumin: 3.9 g/dL (ref 3.5–5.0)
Alkaline Phosphatase: 65 U/L (ref 38–126)
Anion gap: 7 (ref 5–15)
BUN: 14 mg/dL (ref 8–23)
CO2: 23 mmol/L (ref 22–32)
Calcium: 8.5 mg/dL — ABNORMAL LOW (ref 8.9–10.3)
Chloride: 108 mmol/L (ref 98–111)
Creatinine, Ser: 0.7 mg/dL (ref 0.61–1.24)
GFR, Estimated: >60 mL/min (ref >60)
Glucose, Bld: 135 mg/dL — ABNORMAL HIGH (ref 70–99)
Potassium: 4 mmol/L (ref 3.5–5.1)
Sodium: 138 mmol/L (ref 135–145)
Total Bilirubin: 0.5 mg/dL (ref 0.3–1.2)
Total Protein: 6.3 g/dL — ABNORMAL LOW (ref 6.5–8.1)

## 2019-12-09 LAB — LACTIC ACID, PLASMA: Lactic Acid, Venous: 1.9 mmol/L (ref 0.5–1.9)

## 2019-12-09 LAB — CBC WITH DIFFERENTIAL/PLATELET
Abs Immature Granulocytes: 0.03 10*3/uL (ref 0.00–0.07)
Basophils Absolute: 0 10*3/uL (ref 0.0–0.1)
Basophils Relative: 0 %
Eosinophils Absolute: 0 10*3/uL (ref 0.0–0.5)
Eosinophils Relative: 0 %
HCT: 32.9 % — ABNORMAL LOW (ref 39.0–52.0)
Hemoglobin: 10.8 g/dL — ABNORMAL LOW (ref 13.0–17.0)
Immature Granulocytes: 0 %
Lymphocytes Relative: 5 %
Lymphs Abs: 0.4 10*3/uL — ABNORMAL LOW (ref 0.7–4.0)
MCH: 31.1 pg (ref 26.0–34.0)
MCHC: 32.8 g/dL (ref 30.0–36.0)
MCV: 94.8 fL (ref 80.0–100.0)
Monocytes Absolute: 0.8 10*3/uL (ref 0.1–1.0)
Monocytes Relative: 9 %
Neutro Abs: 7 10*3/uL (ref 1.7–7.7)
Neutrophils Relative %: 86 %
Platelets: 161 10*3/uL (ref 150–400)
RBC: 3.47 MIL/uL — ABNORMAL LOW (ref 4.22–5.81)
RDW: 16.7 % — ABNORMAL HIGH (ref 11.5–15.5)
WBC: 8.1 10*3/uL (ref 4.0–10.5)
nRBC: 0 % (ref 0.0–0.2)

## 2019-12-09 LAB — RESPIRATORY PANEL BY RT PCR (FLU A&B, COVID)
Influenza A by PCR: NEGATIVE
Influenza B by PCR: NEGATIVE
SARS Coronavirus 2 by RT PCR: NEGATIVE

## 2019-12-09 LAB — MAGNESIUM: Magnesium: 2.2 mg/dL (ref 1.7–2.4)

## 2019-12-09 LAB — ABO/RH: ABO/RH(D): A POS

## 2019-12-09 LAB — TYPE AND SCREEN
ABO/RH(D): A POS
Antibody Screen: NEGATIVE

## 2019-12-09 IMAGING — DX DG KNEE 1-2V PORT*L*
2 series · 2 of 2 positions shown · non-contrast
Comparison: None.

CLINICAL DATA: Left femur fracture.

EXAM:
PORTABLE LEFT KNEE - 1-2 VIEW

[knee ap]
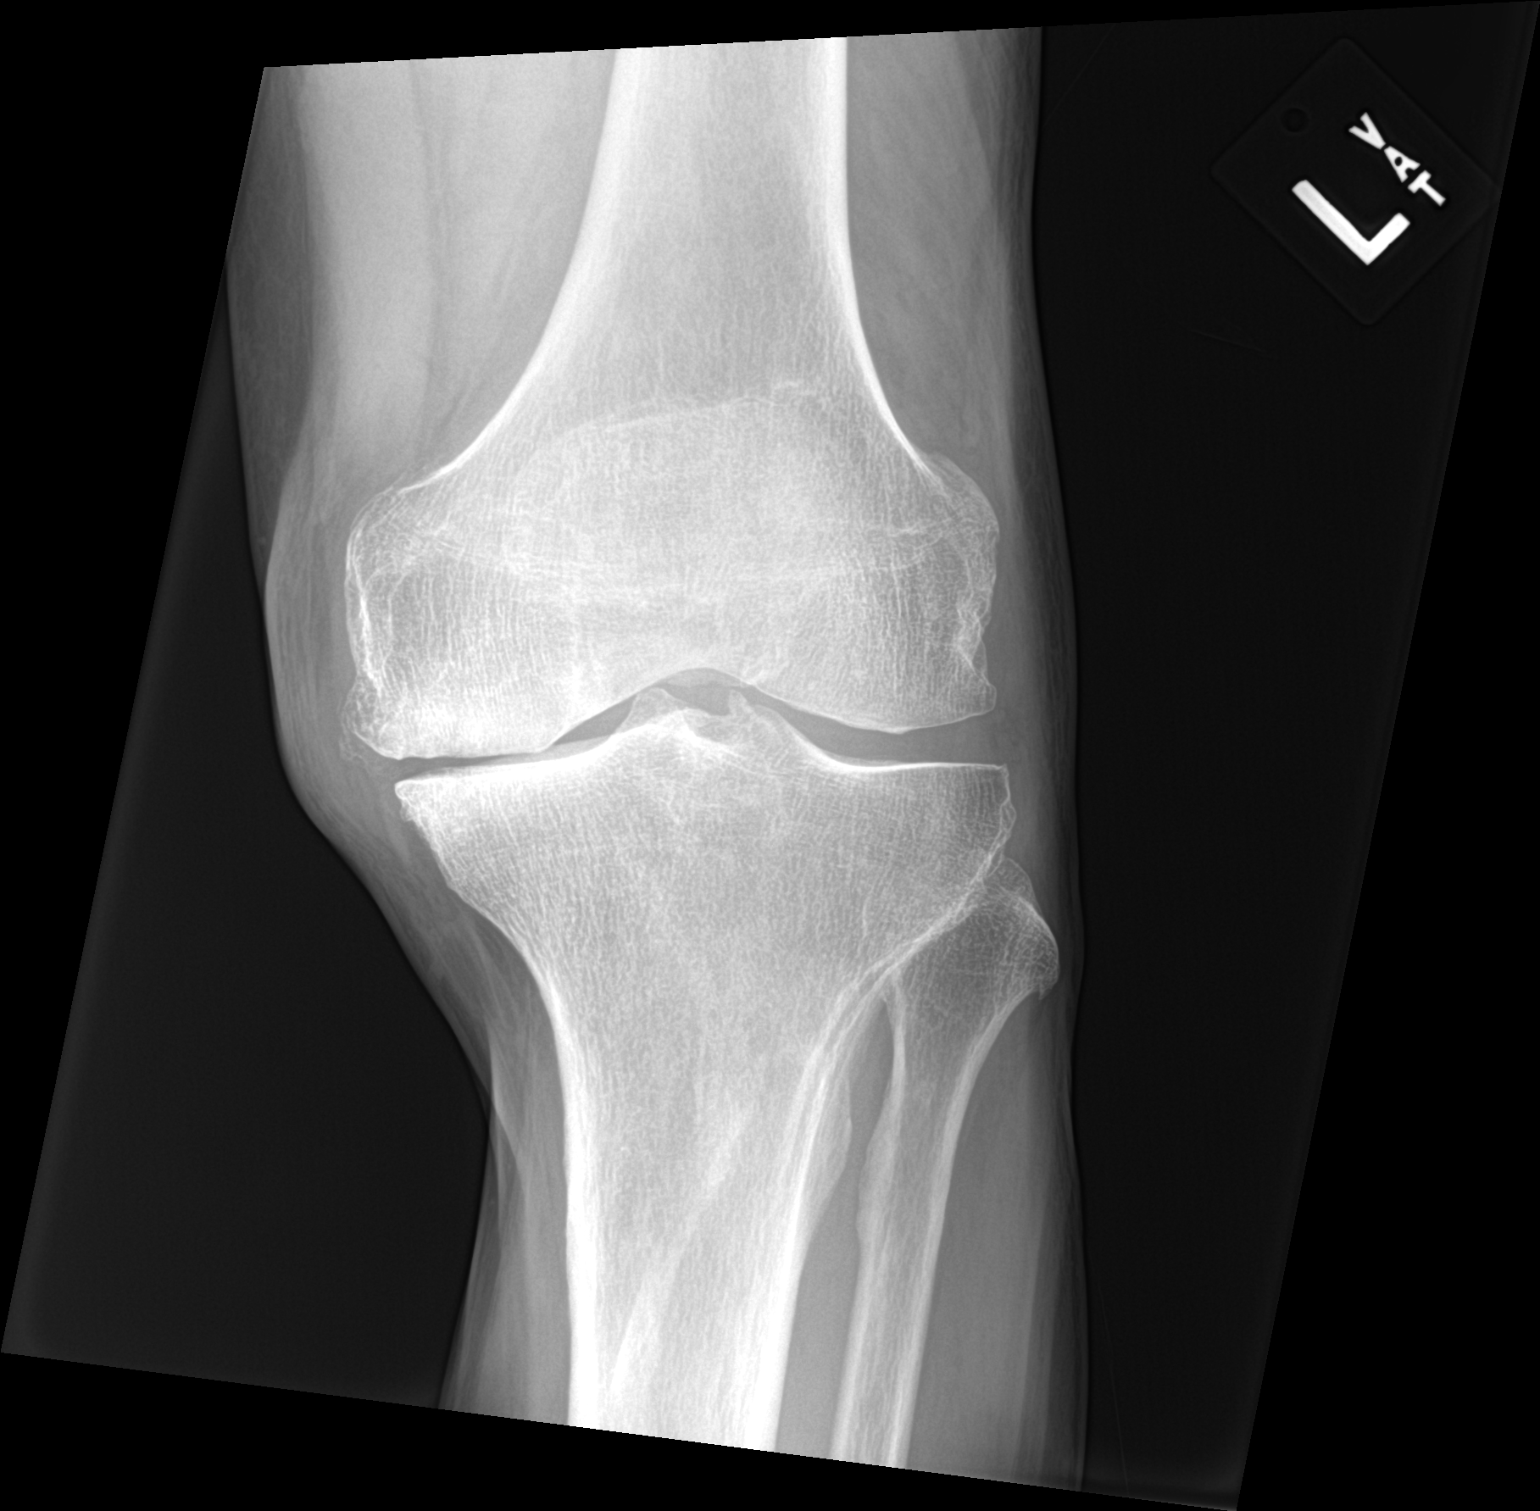

[knee lat]
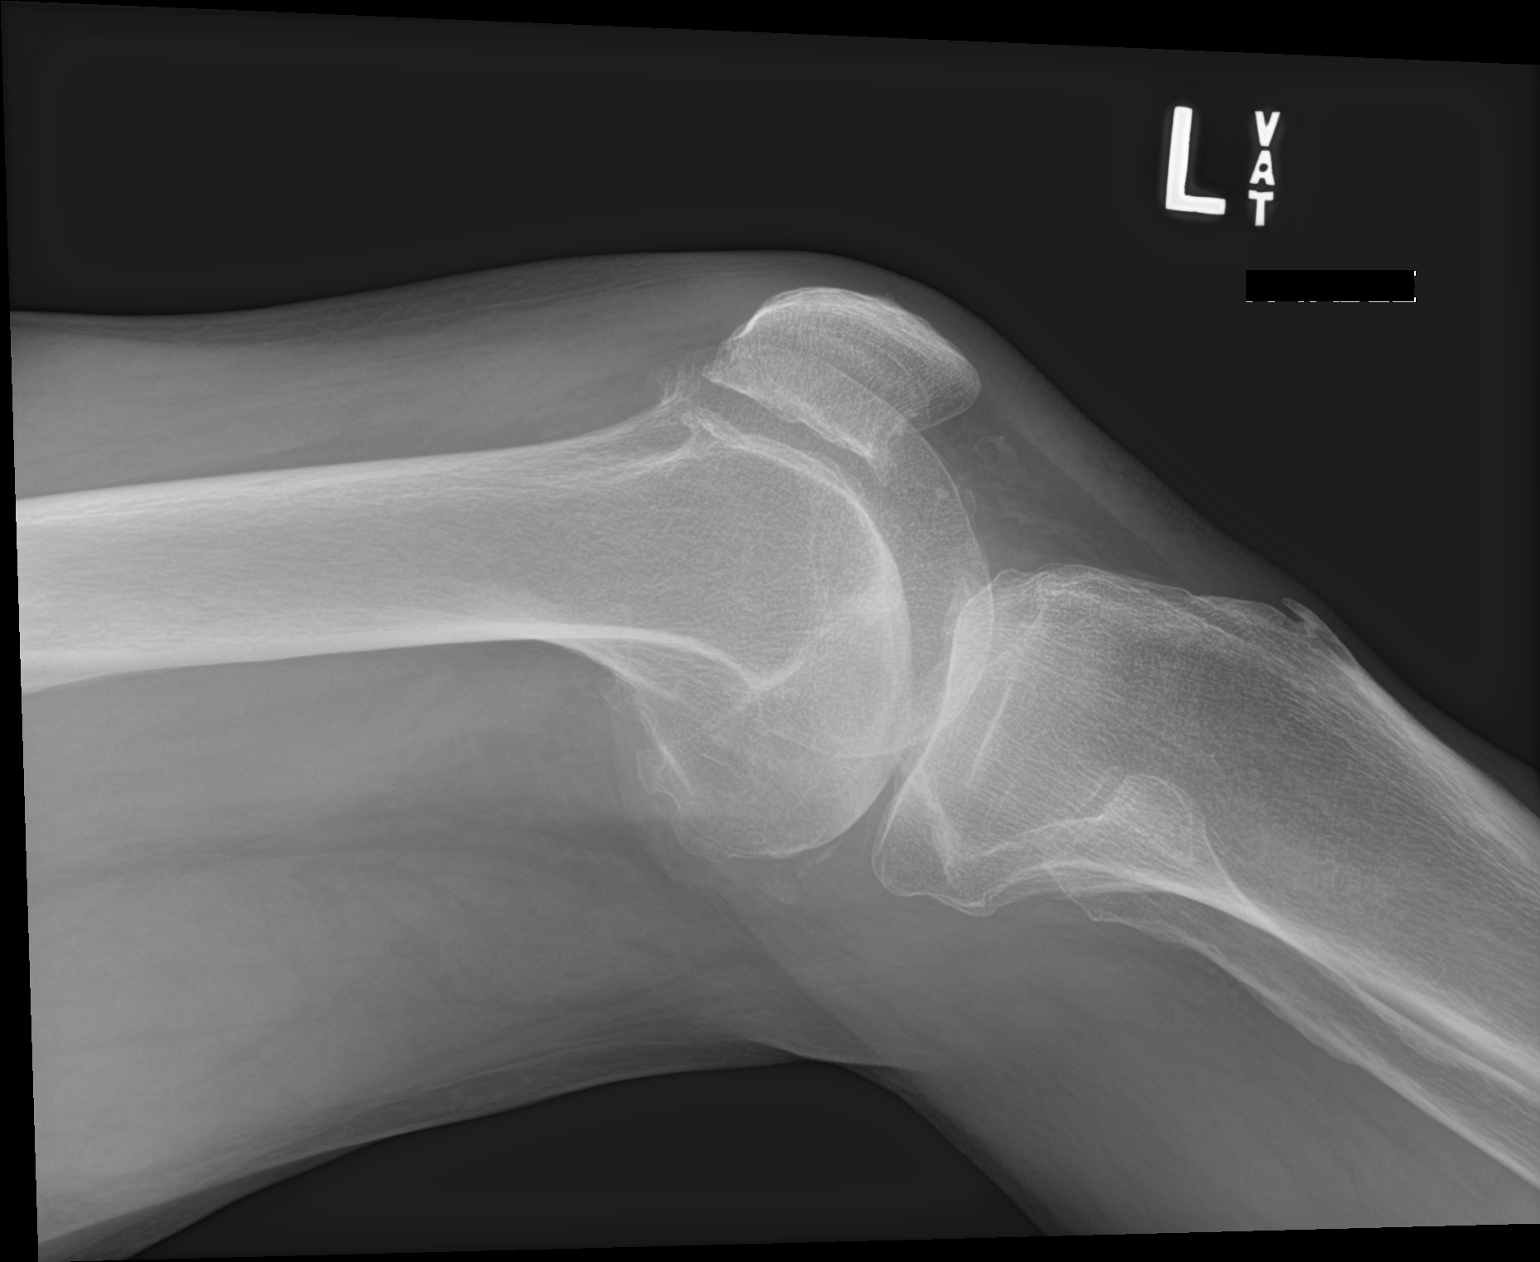

[2 of 2 positions shown; findings below may reference images not displayed]

FINDINGS: No evidence of fracture, dislocation, or joint effusion. Severe
narrowing of medial joint space is noted. Soft tissues are
unremarkable.
IMPRESSION: Severe degenerative joint disease is noted medially. No acute
abnormality seen in the left knee.

## 2019-12-09 IMAGING — MR MR HEAD W/O CM
12 series · 45 of 48 positions shown · non-contrast
Comparison: CT head [DATE].  MRI head [DATE].

CLINICAL DATA: Seizure.  Abnormal neuro exam.

EXAM:
MRI HEAD WITHOUT CONTRAST
TECHNIQUE: Multiplanar, multiecho pulse sequences of the brain and surrounding
structures were obtained without intravenous contrast.

[Series 5: dwi_tracew · axial · 3.0mm · 0.92mm/px · z∈[-65,+91]mm · 8 of 108 slices shown]
[im 1/108]
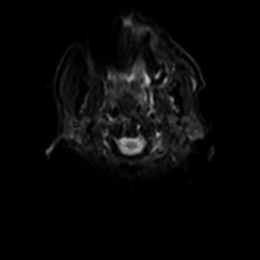
[im 14/108]
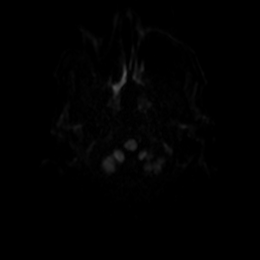
[im 27/108]
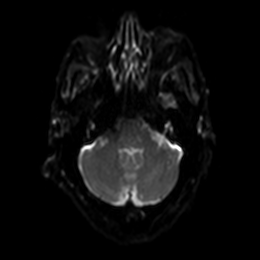
[im 41/108]
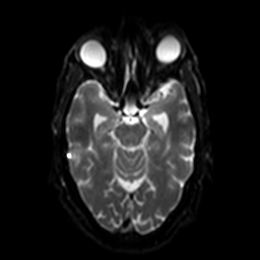
[im 67/108]
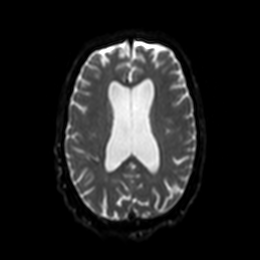
[im 81/108]
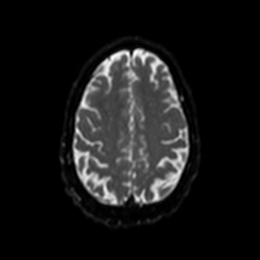
[im 94/108]
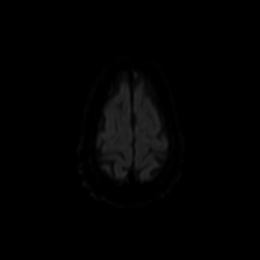
[im 108/108]
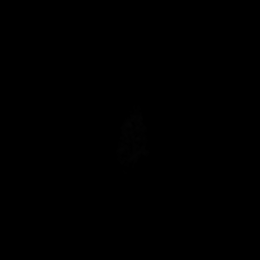

[Series 6: dwi_adc · axial · 3.0mm · 0.92mm/px · z∈[-65,+12]mm · 3 of 54 slices shown]
[im 1/54]
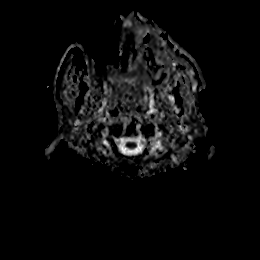
[im 14/54]
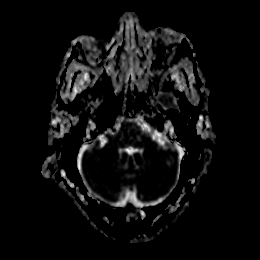
[im 27/54]
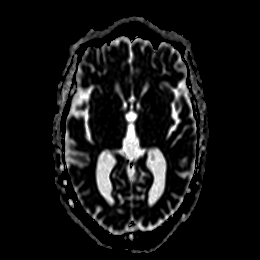

[Series 7: FLAIR · axial · 4.0mm · 0.90mm/px · z∈[-59,+95]mm · 4 of 40 slices shown (1 of 2)]
[im 1/40]
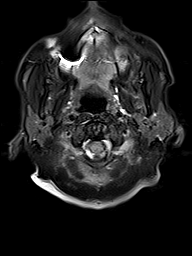
[im 14/40]
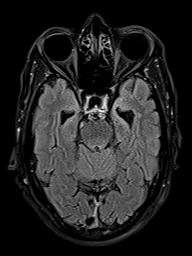
[im 27/40]
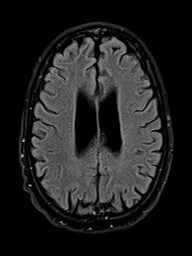
[im 40/40]
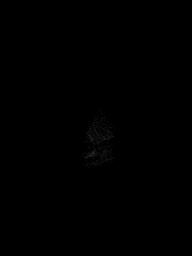

[Series 8: GRE · axial · 4.0mm · 0.45mm/px · z∈[-63,+95]mm · 4 of 41 slices shown]
[im 1/41]
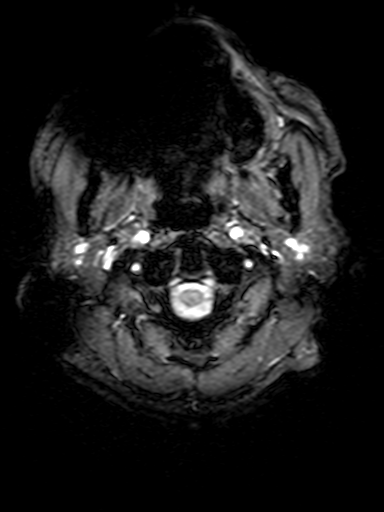
[im 14/41]
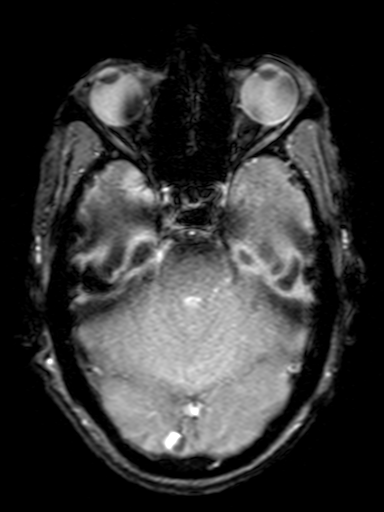
[im 27/41]
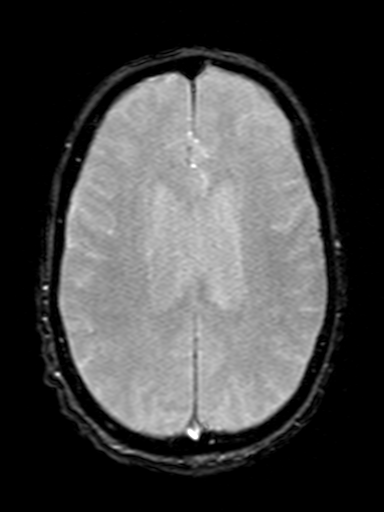
[im 41/41]
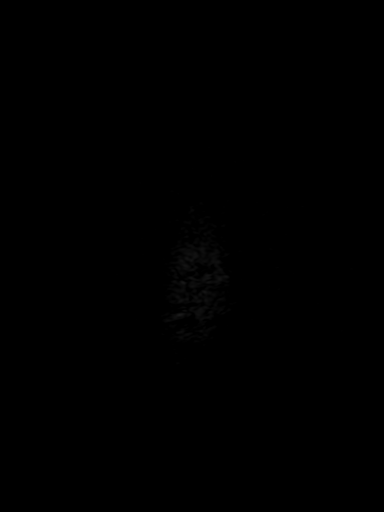

[Series 9: DWI · coronal · 5.0mm · 1.31mm/px · 6 of 60 slices shown (1 of 2)]
[im 1/60]
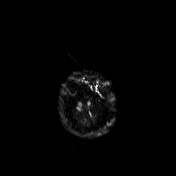
[im 12/60]
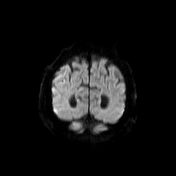
[im 24/60]
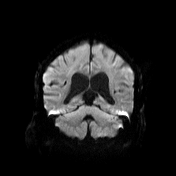
[im 36/60]
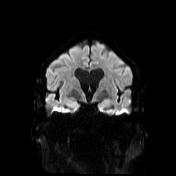
[im 48/60]
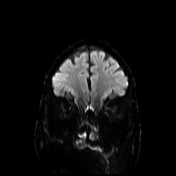
[im 60/60]
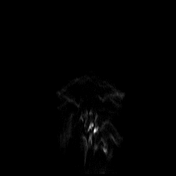

[Series 10: DWI · coronal · 5.0mm · 1.31mm/px · 3 of 30 slices shown (2 of 2)]
[im 1/30]
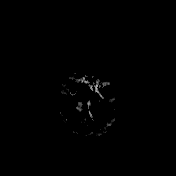
[im 15/30]
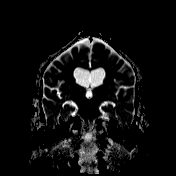
[im 30/30]
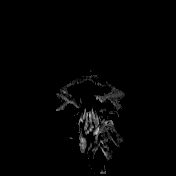

[Series 11: T2 · sagittal · 5.0mm · 0.47mm/px · 2 of 24 slices shown (1 of 4)]
[im 1/24]
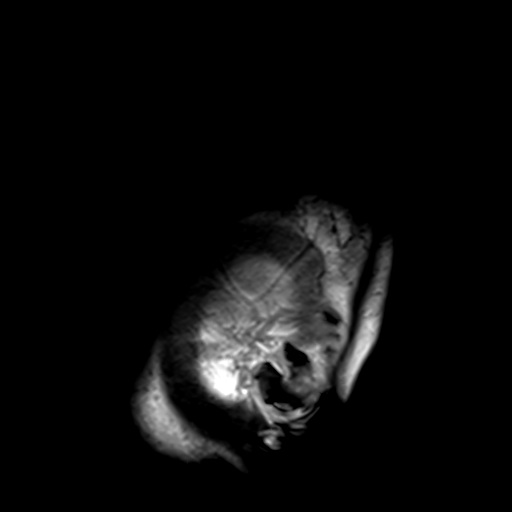
[im 24/24]
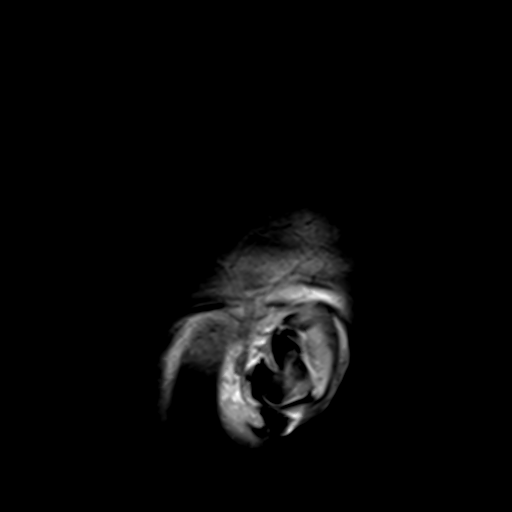

[Series 12: T2 · axial · 5.0mm · 0.45mm/px · z∈[-54,+93]mm · 2 of 24 slices shown (2 of 4)]
[im 1/24]
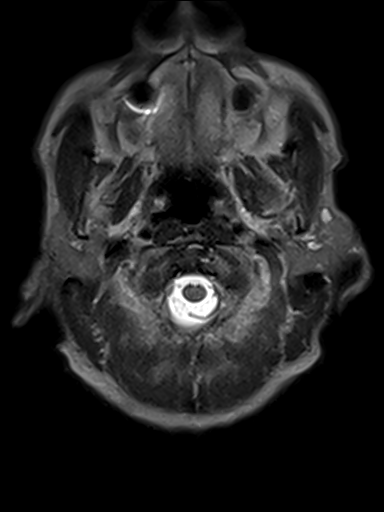
[im 24/24]
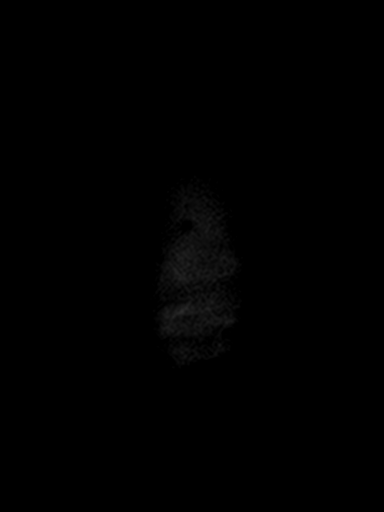

[Series 13: T1 · axial · 4.0mm · 0.45mm/px · z∈[-63,+95]mm · 4 of 41 slices shown]
[im 1/41]
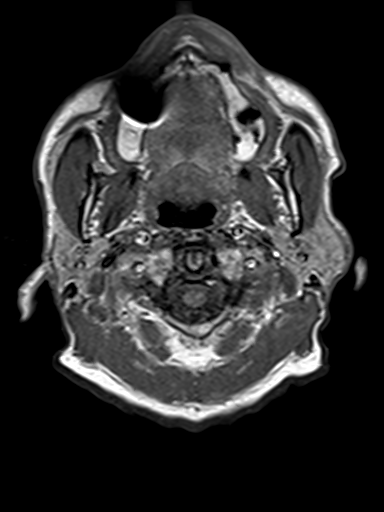
[im 14/41]
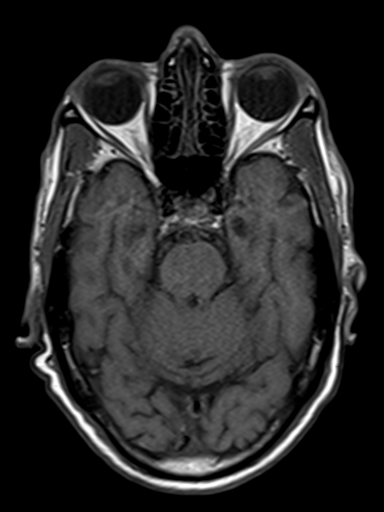
[im 27/41]
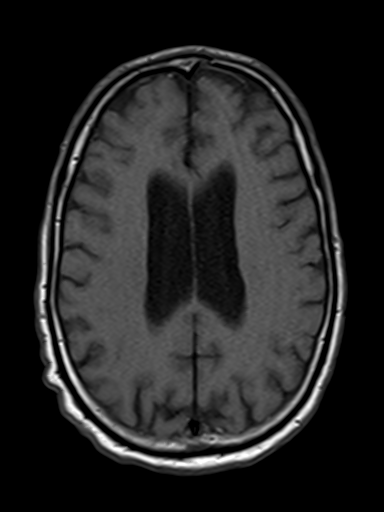
[im 41/41]
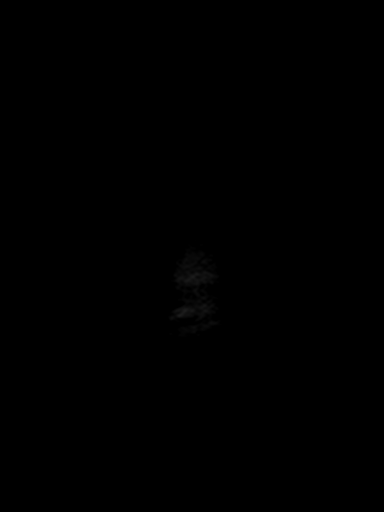

[Series 14: T2 · coronal · 5.0mm · 0.86mm/px · 3 of 30 slices shown (3 of 4)]
[im 1/30]
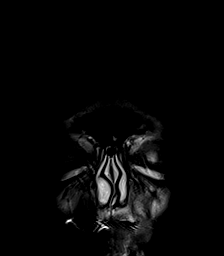
[im 15/30]
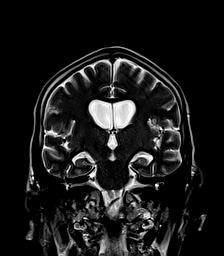
[im 30/30]
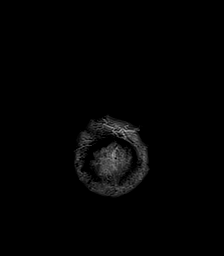

[Series 15: T2 · coronal · 3.0mm · 0.40mm/px · 3 of 34 slices shown (4 of 4)]
[im 1/34]
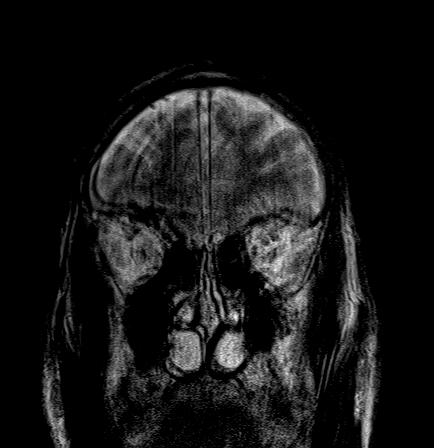
[im 17/34]
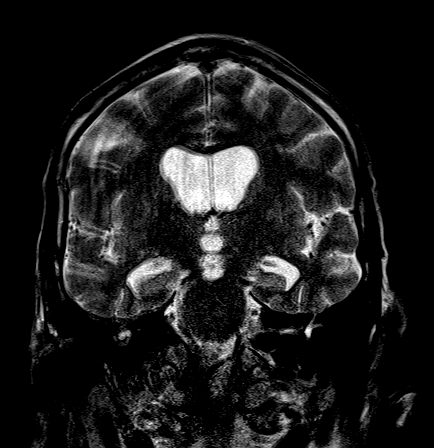
[im 34/34]
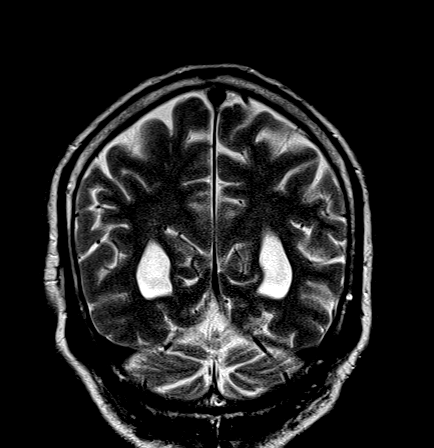

[Series 16: FLAIR · coronal · 3.0mm · 0.56mm/px · 3 of 34 slices shown (2 of 2)]
[im 1/34]
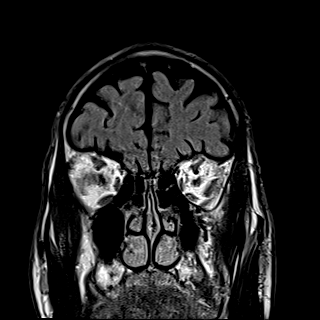
[im 17/34]
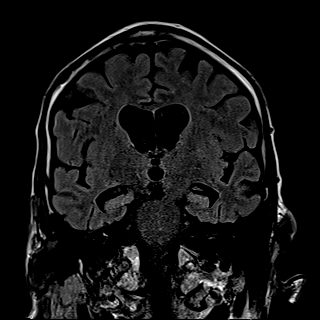
[im 34/34]
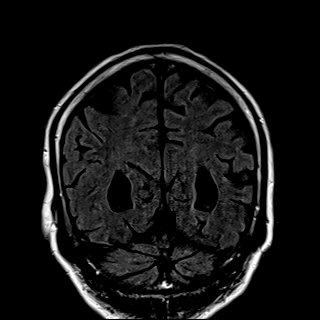

[45 of 48 positions shown; findings below may reference images not displayed]

FINDINGS: Brain: No acute infarction, hemorrhage, hydrocephalus, extra-axial
collection or mass lesion. Minimal T2/FLAIR hyperintensities within
the white matter, likely related to chronic microvascular ischemic
disease. Mild generalized cerebral atrophy with ex vacuo ventricular
dilation. Small remote left cerebellar lacunar infarct with focus of
susceptibility in this region likely related to prior hemorrhage.

Vascular: Major arterial flow voids are maintained at the skull
base.

Skull and upper cervical spine: Normal marrow signal.

Sinuses/Orbits: Minimal paranasal sinus mucosal thickening.
Unremarkable orbits.

Other: No mastoid effusions.
IMPRESSION: No evidence of acute intracranial abnormality.

## 2019-12-09 IMAGING — DX DG CHEST 1V
1 series · 1 of 1 positions shown · non-contrast
Comparison: [DATE]

CLINICAL DATA: Preop hip fracture

EXAM:
CHEST  1 VIEW

[chest ap]
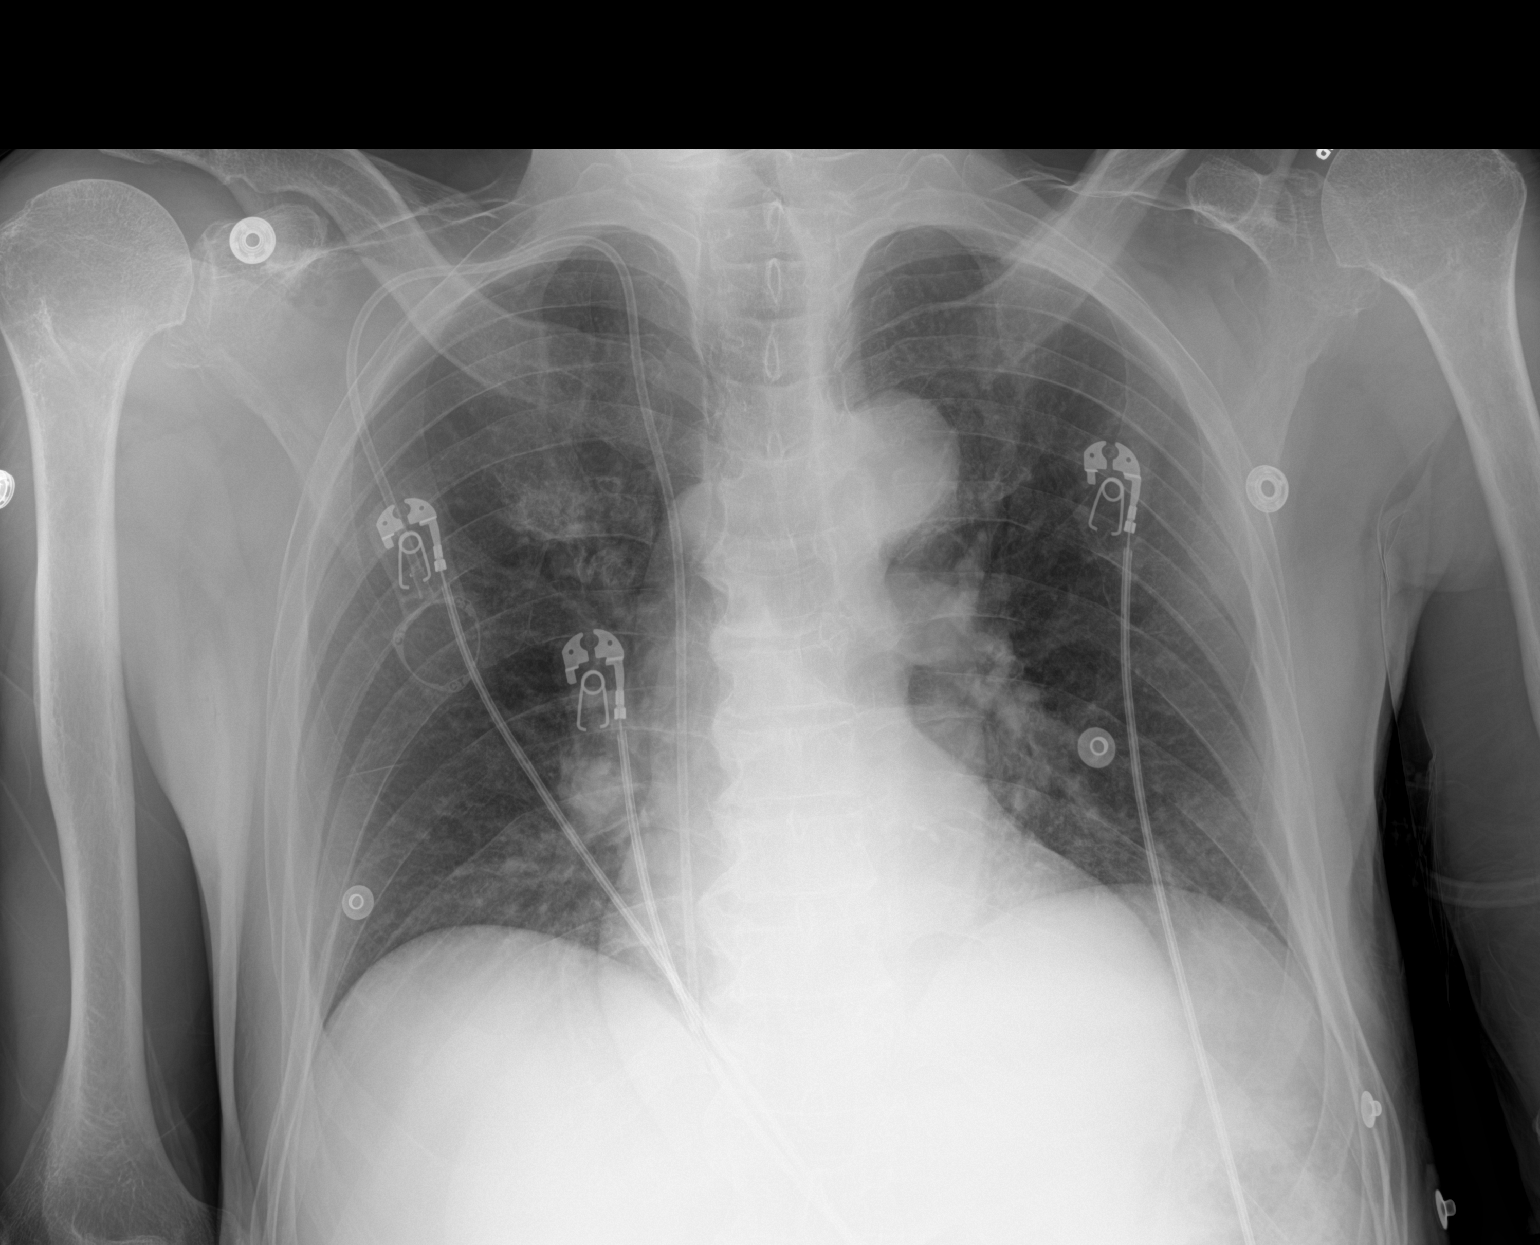

[1 of 1 positions shown; findings below may reference images not displayed]

FINDINGS: Right Port-A-Cath remains in place, unchanged. Heart is normal size.
Lungs are clear. No effusions. No acute bony abnormality.
IMPRESSION: No active disease.

## 2019-12-09 NOTE — Telephone Encounter (Signed)
Ov note from 12/07/2019 faxed to Dr Morton Stall at [place]

## 2019-12-09 NOTE — ED Notes (Signed)
Patients family approaching nurses station again asking about estimated time for transport to [facility]. This nurse spoke with 3W stating the room should be approved shortly.

## 2019-12-09 NOTE — ED Notes (Signed)
Sister in room. Would like to talk to MD. Provided MD with sisters phone number.

## 2019-12-09 NOTE — ED Notes (Signed)
[name] sister POA called, informed patient was doing okay and that I cant give info out over phone but if she would like to come to hospital I can give it to her in person

## 2019-12-09 NOTE — ED Notes (Signed)
Patients family approached nurses station about estimated time to transport to [facility], this nurse explained we were waiting for the room to be cleaned and will transport as soon as able.

## 2019-12-09 NOTE — ED Notes (Signed)
Xray in room at this time.

## 2019-12-09 NOTE — Consult Note (Signed)
NEURO HOSPITALIST CONSULT NOTE   Requestig physician: Dr. Pietro Cassis  Reason for Consult: Breakthrough seizures  History obtained from:  Patient and Chart    HPI:                                                                                                                                          Carlos [name] is an 74 y.o. male with an approximately 2 year history of epilepsy (on Keppra), mild neurocognitive disorder and cancer who presented to the ED in the evening on Tuesday via EMS for assessment of breakthrough seizure. Vitals were stable on arrival, except for mildly elevated HR and RR. Family stated he took a nap and was difficult to wake up. They also noticed blood in his mouth. He has a history of colon cancer and had his last treatment on the day of admission.   In the ED, while awaiting his complete workup, the patient was found to have seizure activity by nursing staff. Semiology was consistent with GTC seizure and lasted about one minute. Following the seizure the patient had several minutes of persistent clenching of the right hand and arm and did not follow any commands. He was treated with 2 mg of Ativan IM. He was also loaded with Keppra. He was observed in for several hours after his seizure he continued to be persistently somnolent and nonverbal. He then slowly began to awaken to verbal commands but was somnolent. Ammonia was very elevated, which was of unclear significance. CT scan showed no acute abnormality.   He was loaded with Keppra 2 g and scheduled Keppra was continued at his home dosing regimen of 1 g BID PO.   Ammonia had normalized on second draw to 24. Most likely the initial result was a false positive.   Past Medical History:  Diagnosis Date  . Anxiety   . Benign prostatic hyperplasia 03/30/2013   10/1 IMO update  . Cancer ([place])   . 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 Seizures ([place])     Past Surgical History:   Procedure Laterality Date  . PORTACATH PLACEMENT N/A 07/01/2019   Procedure: PORT ULTRASOUND GUIDED PLACEMENT;  Surgeon: Alphonsa Overall, MD;  Location: [facility];  Service: General;  Laterality: N/A;  . PROSTATE SURGERY  2004  . TONSILECTOMY, ADENOIDECTOMY, BILATERAL MYRINGOTOMY AND TUBES      Family History  Problem Relation Age of Onset  . Osteoporosis Mother   . Dementia Mother   . Diabetes Mother   . Brain cancer Father               Social History:  reports that he has never smoked. He has never used smokeless tobacco. He reports that he does not drink alcohol and does not use  drugs.  No Known Allergies  MEDICATIONS:                                                                                                                     Prior to Admission:  Medications Prior to Admission  Medication Sig Dispense Refill Last Dose  . bismuth subsalicylate (PEPTO BISMOL) 262 MG/15ML suspension Take 30 mLs by mouth every 6 (six) hours as needed for indigestion.   12/08/2019 at Unknown time  . capecitabine (XELODA) 500 MG tablet Take 3 tablets (1,500 mg total) by mouth 2 (two) times daily after a meal. Take on days of radiation, Monday through Friday (Patient taking differently: Take 500 mg by mouth See admin instructions. Takes 3 tablets in the morning and 3 tablets at night .Take on days of radiation, Monday through Friday. Takes after the radiation.) 90 tablet 0 12/08/2019 at Unknown time  . HYDROcodone-Acetaminophen 5-300 MG TABS Take 1 tablet by mouth every 8 (eight) hours as needed (for severe pain as needed). 20 tablet 0 Past Week at Unknown time  . levETIRAcetam (KEPPRA) 1000 MG tablet Take 1 tablet (1,000 mg total) by mouth 2 (two) times daily. 60 tablet 11 12/08/2019 at Unknown time  . lidocaine-prilocaine (EMLA) cream Apply 1 application topically daily as needed (pain).    12/07/2019 at Unknown time  . Multiple Vitamin (MULTIVITAMIN WITH MINERALS) TABS tablet Take 1 tablet by mouth daily.    12/08/2019 at Unknown time  . OVER THE COUNTER MEDICATION Take 1 tablet by mouth daily. Mind work -  Supplement for brain   12/08/2019 at Unknown time  . traMADol (ULTRAM) 50 MG tablet Take 1 tablet (50 mg total) by mouth every 6 (six) hours as needed for moderate pain. 30 tablet 0 12/07/2019  . Lidocaine, Anorectal, 50 MG SUPP Place 50 mg rectally every 8 (eight) hours as needed. (Patient not taking: Reported on 12/08/2019) 14 suppository 2 Not Taking at Unknown time  . potassium chloride SA (KLOR-CON) 20 MEQ tablet Take 1 tablet (20 mEq total) by mouth daily. (Patient not taking: Reported on 12/08/2019) 14 tablet 0 Not Taking at Unknown time   Scheduled: . capecitabine  1,500 mg Oral 2 times per day on Mon Tue Wed Thu Fri  . heparin  5,000 Units Subcutaneous Q8H  . levETIRAcetam  1,000 mg Oral BID   Continuous:    ROS:  As per HPI. The patient has no additional complaints.    Blood pressure 123/75, pulse 82, temperature 98 F (36.7 C), temperature source Oral, resp. rate 20, SpO2 99 %.   General Examination:                                                                                                       Physical Exam  HEENT-  [place]/AT  Lungs- Respirations unlabored Extremities- Warm and well perfused  Neurological Examination Mental Status: Alert, oriented, thought content appropriate.  Speech fluent without evidence of aphasia.  Able to follow all commands without difficulty. Cranial Nerves: II: Visual fields grossly normal with no extinction to DSS  III,IV, VI: No ptosis. EOMI.  V,VII: Smile symmetric, facial temp sensation equal bilaterally VIII: Hearing intact to voice IX,X: No hypophonia XI: Symmetric XII: Midline tongue extension Motor: Right : Upper extremity   5/5    Left:     Upper extremity   5/5  Lower extremity   5/5     Lower  extremity   5/5 Lower extremity strength was tested distally due to hip fracture Sensory: Temp and light touch intact throughout, bilaterally Deep Tendon Reflexes: 2+ and symmetric throughout Plantars: Right: downgoing   Left: downgoing Cerebellar: No ataxia with FNF bilaterally Gait: Deferred   Lab Results: Basic Metabolic Panel: Recent Labs  Lab 12/07/19 1005 12/08/19 1708 12/08/19 1753 12/09/19 0408  NA 139 145  --  138  K 4.2 3.0*  --  4.0  CL 105 102  --  108  CO2 28 17*  --  23  GLUCOSE 105* 136*  --  135*  BUN 9 13  --  14  CREATININE 0.76 0.92  --  0.70  CALCIUM 9.6 10.0  --  8.5*  MG  --   --  2.6* 2.2    CBC: Recent Labs  Lab 12/07/19 1005 12/08/19 1708 12/09/19 0408  WBC 3.7* 5.8 8.1  NEUTROABS 2.5 4.2 7.0  HGB 12.2* 13.3 10.8*  HCT 36.0* 43.0 32.9*  MCV 91.4 99.8 94.8  PLT 181 229 161    Cardiac Enzymes: No results for input(s): CKTOTAL, CKMB, CKMBINDEX, TROPONINI in the last 168 hours.  Lipid Panel: No results for input(s): CHOL, TRIG, HDL, CHOLHDL, VLDL, LDLCALC in the last 168 hours.  Imaging: DG Chest 1 View  Result Date: 12/09/2019 CLINICAL DATA:  Preop hip fracture EXAM: CHEST  1 VIEW COMPARISON:  09/11/2019 FINDINGS: Right Port-A-Cath remains in place, unchanged. Heart is normal size. Lungs are clear. No effusions. No acute bony abnormality. IMPRESSION: No active disease. Electronically Signed   By: Rolm Baptise M.D.   On: 12/09/2019 01:31   CT Head Wo Contrast  Result Date: 12/08/2019 CLINICAL DATA:  Altered mental status. EXAM: CT HEAD WITHOUT CONTRAST TECHNIQUE: Contiguous axial images were obtained from the base of the skull through the vertex without intravenous contrast. COMPARISON:  September 11, 2019. FINDINGS: Brain: No evidence of acute infarction, hemorrhage, hydrocephalus, extra-axial collection or mass lesion/mass effect. Vascular: No hyperdense vessel or unexpected calcification. Skull: Normal. Negative for  fracture or focal lesion.  Sinuses/Orbits: No acute finding. Other: None. IMPRESSION: Normal head CT. Electronically Signed   By: Marijo Conception M.D.   On: 12/08/2019 18:27   MR BRAIN WO CONTRAST  Result Date: 12/09/2019 CLINICAL DATA:  Seizure.  Abnormal neuro exam. EXAM: MRI HEAD WITHOUT CONTRAST TECHNIQUE: Multiplanar, multiecho pulse sequences of the brain and surrounding structures were obtained without intravenous contrast. COMPARISON:  CT head 11/07/2019.  MRI head 09/11/2019. FINDINGS: Brain: No acute infarction, hemorrhage, hydrocephalus, extra-axial collection or mass lesion. Minimal T2/FLAIR hyperintensities within the white matter, likely related to chronic microvascular ischemic disease. Mild generalized cerebral atrophy with ex vacuo ventricular dilation. Small remote left cerebellar lacunar infarct with focus of susceptibility in this region likely related to prior hemorrhage. Vascular: Major arterial flow voids are maintained at the skull base. Skull and upper cervical spine: Normal marrow signal. Sinuses/Orbits: Minimal paranasal sinus mucosal thickening. Unremarkable orbits. Other: No mastoid effusions. IMPRESSION: No evidence of acute intracranial abnormality. Electronically Signed   By: Margaretha Sheffield MD   On: 12/09/2019 14:11   DG Knee Left Port  Result Date: 12/09/2019 CLINICAL DATA:  Left femur fracture. EXAM: PORTABLE LEFT KNEE - 1-2 VIEW COMPARISON:  None. FINDINGS: No evidence of fracture, dislocation, or joint effusion. Severe narrowing of medial joint space is noted. Soft tissues are unremarkable. 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 No acute abnormality seen in the left knee. Electronically Signed   By: Marijo Conception M.D.   On: 12/09/2019 12:32   EEG adult  Result Date: 12/09/2019 Lora Havens, MD     12/09/2019  1:57 PM Patient Name: Carlos [name] MRN: [id number] Epilepsy Attending: Lora Havens Referring Physician/Provider: Reed Pandy Date: 12/09/2019  Duration: 25.42 mins Patient history: 74 year old male with history of epilepsy who presented with breakthrough seizure.  EEG to evaluate with seizures. Level of alertness: Awake, asleep AEDs during EEG study: Keppra Technical aspects: This EEG study was done with scalp electrodes positioned according to the 10-20 International system of electrode placement. Electrical activity was acquired at a sampling rate of 500Hz  and reviewed with a high frequency filter of 70Hz  and a low frequency filter of 1Hz . EEG data were recorded continuously and digitally stored. Description: The posterior dominant rhythm consists of 8 Hz activity of moderate voltage (25-35 uV) seen predominantly in posterior head regions, symmetric and reactive to eye opening and eye closing.Sleep was characterized by vertex waves, sleep spindles (12 to 14 Hz), maximal frontocentral region.   EEG showed independent left frontotemporal (maximal Fp1, F7) and right anterior temporal (maximal F8) spikes.  Hyperventilation and photic stimulation were not performed.  ABNORMALITY -Spikes, independent left and right temporal. IMPRESSION: This study is consistent with patient's known history of independent left and right temporal spikes (maximal Fp1, F7 and F8). No seizures were seen throughout the recording. Lora Havens   DG Hip Unilat W or Wo Pelvis 2-3 Views Left  Result Date: 12/09/2019 CLINICAL DATA:  Fall, left hip pain EXAM: DG HIP (WITH OR WITHOUT PELVIS) 2-3V LEFT COMPARISON:  None. FINDINGS: Single view radiograph pelvis and two view radiograph left hip demonstrates an acute, mildly comminuted intratrochanteric fracture of the left hip with avulsion of the lesser trochanter, mild override of the major fracture fragments, and mild varus angulation. The femoral head is still seated within the left acetabulum. There is mild superimposed bilateral degenerative hip arthritis with joint space narrowing noted. The pelvis and sacrum are intact.  Limited evaluation  of the right hip is unremarkable. IMPRESSION: Comminuted, angulated left intratrochanteric hip fracture with avulsion of the lesser trochanter. Electronically Signed   By: Fidela Salisbury MD   On: 12/09/2019 00:11   EEG:  ABNORMALITY -Spikes, independent left and right temporal. IMPRESSION: This study is consistent with patient's known history of independent left and right temporal spikes (maximal Fp1, F7 and F8). No seizures were seen throughout the recording.   Assessment: 73 year old male  1. Neurological exam is nonfocal. Recent hip fracture compromises testing of proximal lower extremity strength.  2. MRI brain:  No evidence of acute intracranial abnormality. 3. Plain films of left hip. Comminuted, angulated left intratrochanteric hip fracture with avulsion of the lesser trochanter.  4. EEG: Spikes, independent left and right temporal.This study is consistent with patient's known history of independent left and right temporal spikes. No seizures were seen throughout the recording.  Recommendations: 1. Increasing Keppra to 1250 mg po BID 2. Outpatient Neurology follow up.  3. Per [facility] statutes, patients with seizures are not allowed to drive until  they have been seizure-free for six months. Use caution when using heavy equipment or power tools. Avoid working on ladders or at heights. Take showers instead of baths. Ensure the water temperature is not too high on the home water heater. Do not go swimming alone. When caring for infants or small children, sit down when holding, feeding, or changing them to minimize risk of injury to the child in the event you have a seizure. Also, Maintain good sleep hygiene. Avoid alcohol. 4. Neurohospitalist service will sign off. Please call if there are additional questions.    Electronically signed: Dr. Kerney Elbe 12/09/2019, 7:25 PM

## 2019-12-09 NOTE — ED Notes (Signed)
Fall alarm placed under patient. Patient wearing nonslip socks and a fall risk bracelet. Patient reminded to use his call light when needing assistance, patient verbalized understanding.

## 2019-12-09 NOTE — Progress Notes (Signed)
EEG complete - results pending 

## 2019-12-09 NOTE — ED Notes (Signed)
MD in room

## 2019-12-09 NOTE — ED Notes (Signed)
Patient transported to MRI 

## 2019-12-09 NOTE — ED Notes (Signed)
[facility] called for transport. 

## 2019-12-09 NOTE — ED Notes (Signed)
Informed sister that surgery is scheduled for Friday and that [facility], room is currently being cleaned. Obtained diet order from MD due to patient not having surgery tonight.

## 2019-12-09 NOTE — Procedures (Signed)
Patient Name: AZAM GERVASI  MRN: [id number]  Epilepsy Attending: Lora Havens  Referring Physician/Provider: Reed Pandy Date: 12/09/2019 Duration: 25.42 mins  Patient history: 74 year old male with history of epilepsy who presented with breakthrough seizure.  EEG to evaluate with seizures.  Level of alertness: Awake, asleep  AEDs during EEG study: Keppra  Technical aspects: This EEG study was done with scalp electrodes positioned according to the 10-20 International system of electrode placement. Electrical activity was acquired at a sampling rate of 500Hz  and reviewed with a high frequency filter of 70Hz  and a low frequency filter of 1Hz . EEG data were recorded continuously and digitally stored.   Description: The posterior dominant rhythm consists of 8 Hz activity of moderate voltage (25-35 uV) seen predominantly in posterior head regions, symmetric and reactive to eye opening and eye closing.Sleep was characterized by vertex waves, sleep spindles (12 to 14 Hz), maximal frontocentral region.   EEG showed independent left frontotemporal (maximal Fp1, F7) and right anterior temporal (maximal F8) spikes.  Hyperventilation and photic stimulation were not performed.    ABNORMALITY -Spikes, independent left and right temporal.  IMPRESSION: This study is consistent with patient's known history of independent left and right temporal spikes (maximal Fp1, F7 and F8). No seizures were seen throughout the recording.  Kendarius Vigen Barbra Sarks

## 2019-12-09 NOTE — ED Notes (Signed)
assisted patient with use of urinal and patient states unable to use at this time. EEG states would like patient later in the morning.

## 2019-12-09 NOTE — ED Notes (Signed)
EEG in room at this time  

## 2019-12-09 NOTE — Progress Notes (Signed)
PROGRESS NOTE    Carlos [name]  SWH:[id number] DOB: 06/11/1945 DOA: 12/08/2019 PCP: [name], MD   Brief Narrative:  HPI per Dr. Carlynn Purl on 12/08/19  Carlos [name]  is a 74 y.o. male, no history of seizures, mild neurocognitive disorder of unclear etiology, rectal adenocarcinoma, BPH, and anxiety presents to the ED with a chief complaint of seizure.  Patient himself is not able to provide a history as he does not remember the events.  Sister at bedside was also not present, but reports that people walked into the room to find him somnolent with blood in his mouth.  He was supposed to have been napping after his radiation treatment today.  They suspected that he had a seizure so they called EMS to come into the ER.  While in the ER patient had another witnessed seizure.  ED provider reports tonic-clonic activity for 1 minute, Ativan was given and then he had contracture or spasm of his right side for another 10 minutes.  Patient remained nonverbal for 2 hours after this is post physical state and at the time of admission he is still somnolent and a little confused.  Patient was put on seizure medication in August 2021 when he developed seizure activity during chemo.  He was started on Keppra 1 g twice daily.  He and his family are very strict with his meds and he has not missed any doses.  Patient has been having trouble sleeping lately due to rectal pain, especially after radiation treatments.  Possibly the seizure was triggered by sleep deprivation.  In the ED Temperature 98.1, heart rate 90, respiratory 20, blood pressure 123/74 CT head was done and was a normal head CT. Initial lab work showed a elevated ammonia 182, repeat was 24 Mag was 2.6 Neuro was consulted and advised admission to [facility] where they can consult.  They advised 2 g loading dose of Keppra in the ER tonight, and then to continue his 1 g twice daily after that. And patient CHEM panel he did have a hypokalemia  3.0  **Interim History  Patient fell out of his bed and landed on his left hip and x-rays were done and showed he had an acute fracture.  Orthopedic surgery was consulted and they are planning for surgical intervention once cleared by neuro.  Patient still awaiting transfer to [facility] and I spoke with Dr. Curly Shores of neurology who recommends further work-up and wants to evaluate the patient prior to patient going for surgical intervention.  Assessment & Plan:   Active Problems:   Breakthrough seizure ([place])  Breakthrough Seizures -Patient developed seizures while in chemo in 09/2019 -He has been compliant with his keppra 1g BID -Today, he was found somnolent with blood in his mouth - suspected seizure -2nd witnessed seizure in ED -Overnight Neuro advised load with 2 g Keppra, continue 1g BID, and transfer to [facility] for neuro consult -Continue seizure precautions -Patient fell out of the bed and likely had another seizure as he was found on the floor and found in stool. -I spoke with the neurologist Dr. Curly Shores recommended MRI and felt that the patient needed to come to [facility] prior to his surgical intervention for further evaluation given the unclear etiology of his seizures -Patient may need LTM -MRI done and showed no evidence of any acute intracranial abnormality -Spot EEG done and showed the study was consistent with the patient's known history of independent left and right temporal strokes; no seizures were  seen throughout the recording -Awaiting transfer to [facility] for further neurological intervention and evaluation  Left Hip Fracture -Patient found on the floor after 10 PM and x-ray of his hip showed comminuted, angulated intertrochanteric hip fracture with avulsion of the lesser trochanter  -Orthopedic Surgery ([facility]) was notified and patient is to have surgical intervention done at [facility] after neurological evaluation -Continue with pain control and will have  orthopedic follow-up once he is cleared from a neurologic perspective after that evaluation by Dr. Maurine Minister or one of her partners -Avoid tramadol but will continue hydromorphone 0.5-1 mg every 2 hours as needed severe pain and hydrocodone-acetaminophen 1 tab p.o. every 4 as needed for moderate pain  Hypokalemia -Improved.  Potassium is now 4.0 -Continue to monitor and replete as necessary -Repeat CMP in a.m.  Hyperammonemia -Initially 182, and then 24 -Neuro had advised an MRI brain if ammonia remained high, since it normalized without intervention - no MR -Possible lab error with first read, or more likely -related to seizure -Continue to Monitor and Repeat in the AM   Rectal Adenocarcinoma -Continue home antineoplastic agent with capecitabine 1500 g p.o. twice daily and continue with lidocaine and rectal 50 mg rectally every 8 hours as needed for pain -Continue to monitor -May need to notify his oncologist as a courtesy  Elevated Anion Gap and Metabolic Acidosis -Likely acidotic state 2/2 seizure -Lactic acid was 1.9 -Anion gap metabolic acidosis is improved as CO2 is 23, anion gap is 7, chloride level is 108 -Continue monitor and repeat CMP in a.m.  Normocytic Anemia -Patient's hemoglobin/hematocrit went from 12.2/36.0 and trended down to 13.3/43.0 and is now 10.8/32.9  -Check anemia panel in the a.m. -Continue to monitor for signs and symptoms of bleeding; currently no overt bleeding noted  -Repeat CBC in a.m.  DVT prophylaxis: Heparin injection 5000 units subcu every 8 hours Code Status: FULL CODE Family Communication: Discussed with sister via the telephone Disposition Plan: Transfer to [facility] once bed is available  Status is: Observation  The patient will require care spanning > 2 midnights and should be moved to inpatient because: Unsafe d/c plan, IV treatments appropriate due to intensity of illness or inability to take PO and Inpatient level of care appropriate  due to severity of illness  Dispo: The patient is from: Home              Anticipated d/c is to: SNF              Anticipated d/c date is: 3 days              Patient currently is not medically stable to d/c.  Consultants:   Neurology   Procedures: EEG This study is consistent with patient's known history of independent left and right temporal spikes (maximal Fp1, F7 and F8). No seizures were seen throughout the recording.  Antimicrobials:  Anti-infectives (From admission, onward)   None       Subjective: Seen and examined at bedside and he is complaining of some left hip pain.  No nausea or vomiting.  Does not know how he fell out of the bed but states that he fell out of bed when his guardrails was not up.  No nausea or vomiting.  Feels weak.  No other concerns or plans at this time and no further seizure activity and is much more awake now.  Objective: Vitals:   12/09/19 0630 12/09/19 0700 12/09/19 0745 12/09/19 0800  BP:  123/78 123/73 119/75 118/74  Pulse: 79   74  Resp: 15 14 14 15   Temp:      TempSrc:      SpO2: 100% 100% 100% 100%    Intake/Output Summary (Last 24 hours) at 12/09/2019 0839 Last data filed at 12/08/2019 2111 Gross per 24 hour  Intake 700 ml  Output --  Net 700 ml   There were no vitals filed for this visit.  Examination: Physical Exam:  Constitutional: Thin elderly African-American male currently in NAD and appears calm  Eyes: PERRL, lids and conjunctivae normal, sclerae anicteric  ENMT: External Ears, Nose appear normal. Grossly normal hearing. Mucous membranes are moist. Posterior pharynx clear of any exudate or lesions. Normal dentition.  Neck: Appears normal, supple, no cervical masses, normal ROM, no appreciable thyromegaly; no JVD Respiratory: Diminished to auscultation bilaterally, no wheezing, rales, rhonchi or crackles. Normal respiratory effort and patient is not tachypenic. No accessory muscle use.  Unlabored breathing Cardiovascular:  RRR, no murmurs / rubs / gallops. S1 and S2 auscultated. Trace extremity edema Abdomen: Soft, non-tender, non-distended. Bowel sounds positive x4.  GU: Deferred. Musculoskeletal: No clubbing / cyanosis of digits/nails.  Left leg is shortened and rotated externally Skin: No rashes, lesions, ulcers on limited skin evaluation. No induration; Warm and dry.  Neurologic: CN 2-12 grossly intact with no focal deficits. Romberg sign cerebellar reflexes not assessed.  Psychiatric: Normal judgment and insight. Alert and oriented x 3. Normal mood and appropriate affect.   Data Reviewed: I have personally reviewed following labs and imaging studies  CBC: Recent Labs  Lab 12/07/19 1005 12/08/19 1708 12/09/19 0408  WBC 3.7* 5.8 8.1  NEUTROABS 2.5 4.2 7.0  HGB 12.2* 13.3 10.8*  HCT 36.0* 43.0 32.9*  MCV 91.4 99.8 94.8  PLT 181 229 854   Basic Metabolic Panel: Recent Labs  Lab 12/07/19 1005 12/08/19 1708 12/08/19 1753 12/09/19 0408  NA 139 145  --  138  K 4.2 3.0*  --  4.0  CL 105 102  --  108  CO2 28 17*  --  23  GLUCOSE 105* 136*  --  135*  BUN 9 13  --  14  CREATININE 0.76 0.92  --  0.70  CALCIUM 9.6 10.0  --  8.5*  MG  --   --  2.6* 2.2   GFR: Estimated Creatinine Clearance: 68.4 mL/min (by C-G formula based on SCr of 0.7 mg/dL). Liver Function Tests: Recent Labs  Lab 12/07/19 1005 12/08/19 1708 12/09/19 0408  AST 31 36 28  ALT 24 28 26   ALKPHOS 87 92 65  BILITOT 0.6 0.8 0.5  PROT 7.2 8.4* 6.3*  ALBUMIN 4.0 4.9 3.9   No results for input(s): LIPASE, AMYLASE in the last 168 hours. Recent Labs  Lab 12/08/19 1708 12/08/19 2014  AMMONIA 182* 24   Coagulation Profile: No results for input(s): INR, PROTIME in the last 168 hours. Cardiac Enzymes: No results for input(s): CKTOTAL, CKMB, CKMBINDEX, TROPONINI in the last 168 hours. BNP (last 3 results) No results for input(s): PROBNP in the last 8760 hours. HbA1C: No results for input(s): HGBA1C in the last 72  hours. CBG: Recent Labs  Lab 12/08/19 1746  GLUCAP 117*   Lipid Profile: No results for input(s): CHOL, HDL, LDLCALC, TRIG, CHOLHDL, LDLDIRECT in the last 72 hours. Thyroid Function Tests: No results for input(s): TSH, T4TOTAL, FREET4, T3FREE, THYROIDAB in the last 72 hours. Anemia Panel: No results for input(s): VITAMINB12, FOLATE, FERRITIN, TIBC, IRON, RETICCTPCT in  the last 72 hours. Sepsis Labs: Recent Labs  Lab 12/08/19 2230 12/09/19 0119  LATICACIDVEN 1.9 1.9    Recent Results (from the past 240 hour(s))  Respiratory Panel by RT PCR (Flu A&B, Covid) - Nasopharyngeal Swab     Status: None   Collection Time: 12/08/19  9:17 PM   Specimen: Nasopharyngeal Swab  Result Value Ref Range Status   SARS Coronavirus 2 by RT PCR NEGATIVE NEGATIVE Final    Comment: (NOTE) SARS-CoV-2 target nucleic acids are NOT DETECTED.  The SARS-CoV-2 RNA is generally detectable in upper respiratoy specimens during the acute phase of infection. The lowest concentration of SARS-CoV-2 viral copies this assay can detect is 131 copies/mL. A negative result does not preclude SARS-Cov-2 infection and should not be used as the sole basis for treatment or other patient management decisions. A negative result may occur with  improper specimen collection/handling, submission of specimen other than nasopharyngeal swab, presence of viral mutation(s) within the areas targeted by this assay, and inadequate number of viral copies (<131 copies/mL). A negative result must be combined with clinical observations, patient history, and epidemiological information. The expected result is Negative.  Fact Sheet for Patients:  PinkCheek.be  Fact Sheet for Healthcare Providers:  GravelBags.it  This test is no t yet approved or cleared by the [place] FDA and  has been authorized for detection and/or diagnosis of SARS-CoV-2 by FDA under an Emergency Use  Authorization (EUA). This EUA will remain  in effect (meaning this test can be used) for the duration of the COVID-19 declaration under Section 564(b)(1) of the Act, 21 U.S.C. section 360bbb-3(b)(1), unless the authorization is terminated or revoked sooner.     Influenza A by PCR NEGATIVE NEGATIVE Final   Influenza B by PCR NEGATIVE NEGATIVE Final    Comment: (NOTE) The Xpert Xpress SARS-CoV-2/FLU/RSV assay is intended as an aid in  the diagnosis of influenza from Nasopharyngeal swab specimens and  should not be used as a sole basis for treatment. Nasal washings and  aspirates are unacceptable for Xpert Xpress SARS-CoV-2/FLU/RSV  testing.  Fact Sheet for Patients: PinkCheek.be  Fact Sheet for Healthcare Providers: GravelBags.it  This test is not yet approved or cleared by the [place] FDA and  has been authorized for detection and/or diagnosis of SARS-CoV-2 by  FDA under an Emergency Use Authorization (EUA). This EUA will remain  in effect (meaning this test can be used) for the duration of the  Covid-19 declaration under Section 564(b)(1) of the Act, 21  U.S.C. section 360bbb-3(b)(1), unless the authorization is  terminated or revoked. Performed at [facility], [place] [address], [place], [place] [address]     RN Pressure Injury Documentation:     Estimated body mass index is 20.63 kg/m as calculated from the following:   Height as of 12/07/19: 5\' 7"  (1.702 m).   Weight as of 12/07/19: 59.7 kg.  Malnutrition Type:      Malnutrition Characteristics:      Nutrition Interventions:    Radiology Studies: DG Chest 1 View  Result Date: 12/09/2019 CLINICAL DATA:  Preop hip fracture EXAM: CHEST  1 VIEW COMPARISON:  09/11/2019 FINDINGS: Right Port-A-Cath remains in place, unchanged. Heart is normal size. Lungs are clear. No effusions. No acute bony abnormality. IMPRESSION: No active disease.  Electronically Signed   By: Rolm Baptise M.D.   On: 12/09/2019 01:31   CT Head Wo Contrast  Result Date: 12/08/2019 CLINICAL DATA:  Altered mental status. EXAM: CT HEAD  WITHOUT CONTRAST TECHNIQUE: Contiguous axial images were obtained from the base of the skull through the vertex without intravenous contrast. COMPARISON:  September 11, 2019. FINDINGS: Brain: No evidence of acute infarction, hemorrhage, hydrocephalus, extra-axial collection or mass lesion/mass effect. Vascular: No hyperdense vessel or unexpected calcification. Skull: Normal. Negative for fracture or focal lesion. Sinuses/Orbits: No acute finding. Other: None. IMPRESSION: Normal head CT. Electronically Signed   By: Marijo Conception M.D.   On: 12/08/2019 18:27   DG Hip Unilat W or Wo Pelvis 2-3 Views Left  Result Date: 12/09/2019 CLINICAL DATA:  Fall, left hip pain EXAM: DG HIP (WITH OR WITHOUT PELVIS) 2-3V LEFT COMPARISON:  None. FINDINGS: Single view radiograph pelvis and two view radiograph left hip demonstrates an acute, mildly comminuted intratrochanteric fracture of the left hip with avulsion of the lesser trochanter, mild override of the major fracture fragments, and mild varus angulation. The femoral head is still seated within the left acetabulum. There is mild superimposed bilateral degenerative hip arthritis with joint space narrowing noted. The pelvis and sacrum are intact. Limited evaluation of the right hip is unremarkable. IMPRESSION: Comminuted, angulated left intratrochanteric hip fracture with avulsion of the lesser trochanter. Electronically Signed   By: Fidela Salisbury MD   On: 12/09/2019 00:11   Scheduled Meds: . capecitabine  1,500 mg Oral 2 times per day on Mon Tue Wed Thu Fri  . heparin  5,000 Units Subcutaneous Q8H  . levETIRAcetam  1,000 mg Oral BID   Continuous Infusions:   LOS: 0 days   Kerney Elbe, DO [facility] PAGER is on [place]  If 7PM-7AM, please contact night-coverage [email or web address]

## 2019-12-10 ENCOUNTER — Ambulatory Visit: Payer: 59

## 2019-12-10 ENCOUNTER — Telehealth: Payer: Self-pay | Admitting: Hematology

## 2019-12-10 DIAGNOSIS — G40909 Epilepsy, unspecified, not intractable, without status epilepticus: Secondary | ICD-10-CM | POA: Diagnosis present

## 2019-12-10 DIAGNOSIS — S72142A Displaced intertrochanteric fracture of left femur, initial encounter for closed fracture: Secondary | ICD-10-CM

## 2019-12-10 DIAGNOSIS — R569 Unspecified convulsions: Secondary | ICD-10-CM | POA: Diagnosis present

## 2019-12-10 DIAGNOSIS — C20 Malignant neoplasm of rectum: Secondary | ICD-10-CM | POA: Diagnosis present

## 2019-12-10 DIAGNOSIS — G40919 Epilepsy, unspecified, intractable, without status epilepticus: Secondary | ICD-10-CM | POA: Diagnosis not present

## 2019-12-10 DIAGNOSIS — W06XXXA Fall from bed, initial encounter: Secondary | ICD-10-CM | POA: Diagnosis present

## 2019-12-10 DIAGNOSIS — M7989 Other specified soft tissue disorders: Secondary | ICD-10-CM | POA: Diagnosis not present

## 2019-12-10 DIAGNOSIS — Z79899 Other long term (current) drug therapy: Secondary | ICD-10-CM | POA: Diagnosis not present

## 2019-12-10 DIAGNOSIS — Z20822 Contact with and (suspected) exposure to covid-19: Secondary | ICD-10-CM | POA: Diagnosis present

## 2019-12-10 DIAGNOSIS — E876 Hypokalemia: Secondary | ICD-10-CM | POA: Diagnosis present

## 2019-12-10 DIAGNOSIS — E46 Unspecified protein-calorie malnutrition: Secondary | ICD-10-CM | POA: Diagnosis not present

## 2019-12-10 DIAGNOSIS — L89329 Pressure ulcer of left buttock, unspecified stage: Secondary | ICD-10-CM | POA: Diagnosis not present

## 2019-12-10 DIAGNOSIS — D62 Acute posthemorrhagic anemia: Secondary | ICD-10-CM | POA: Diagnosis not present

## 2019-12-10 DIAGNOSIS — E8809 Other disorders of plasma-protein metabolism, not elsewhere classified: Secondary | ICD-10-CM | POA: Diagnosis not present

## 2019-12-10 DIAGNOSIS — S72122A Displaced fracture of lesser trochanter of left femur, initial encounter for closed fracture: Secondary | ICD-10-CM | POA: Diagnosis present

## 2019-12-10 DIAGNOSIS — S72145S Nondisplaced intertrochanteric fracture of left femur, sequela: Secondary | ICD-10-CM | POA: Diagnosis not present

## 2019-12-10 DIAGNOSIS — D649 Anemia, unspecified: Secondary | ICD-10-CM | POA: Diagnosis present

## 2019-12-10 DIAGNOSIS — R8290 Unspecified abnormal findings in urine: Secondary | ICD-10-CM | POA: Diagnosis not present

## 2019-12-10 DIAGNOSIS — E7220 Disorder of urea cycle metabolism, unspecified: Secondary | ICD-10-CM | POA: Diagnosis not present

## 2019-12-10 DIAGNOSIS — Z741 Need for assistance with personal care: Secondary | ICD-10-CM | POA: Diagnosis not present

## 2019-12-10 DIAGNOSIS — N400 Enlarged prostate without lower urinary tract symptoms: Secondary | ICD-10-CM | POA: Diagnosis present

## 2019-12-10 DIAGNOSIS — Y92230 Patient room in hospital as the place of occurrence of the external cause: Secondary | ICD-10-CM | POA: Diagnosis present

## 2019-12-10 DIAGNOSIS — R195 Other fecal abnormalities: Secondary | ICD-10-CM | POA: Diagnosis not present

## 2019-12-10 DIAGNOSIS — S72145D Nondisplaced intertrochanteric fracture of left femur, subsequent encounter for closed fracture with routine healing: Secondary | ICD-10-CM | POA: Diagnosis not present

## 2019-12-10 DIAGNOSIS — G3184 Mild cognitive impairment, so stated: Secondary | ICD-10-CM | POA: Diagnosis present

## 2019-12-10 DIAGNOSIS — Z7409 Other reduced mobility: Secondary | ICD-10-CM | POA: Diagnosis not present

## 2019-12-10 DIAGNOSIS — M25552 Pain in left hip: Secondary | ICD-10-CM | POA: Diagnosis not present

## 2019-12-10 DIAGNOSIS — E872 Acidosis: Secondary | ICD-10-CM | POA: Diagnosis present

## 2019-12-10 DIAGNOSIS — L89319 Pressure ulcer of right buttock, unspecified stage: Secondary | ICD-10-CM | POA: Diagnosis not present

## 2019-12-10 DIAGNOSIS — F419 Anxiety disorder, unspecified: Secondary | ICD-10-CM | POA: Diagnosis present

## 2019-12-10 DIAGNOSIS — G8918 Other acute postprocedural pain: Secondary | ICD-10-CM | POA: Diagnosis not present

## 2019-12-10 MED ORDER — LACTATED RINGERS IV SOLN: INTRAVENOUS | Status: AC

## 2019-12-10 MED ORDER — LEVETIRACETAM 750 MG PO TABS
1250.0000 mg | ORAL_TABLET | Freq: Two times a day (BID) | ORAL | Status: DC
  Administered 2019-12-10 – 2019-12-15 (×10): 1250 mg via ORAL
  Filled 2019-12-10 (×10): qty 1

## 2019-12-10 MED ORDER — HYDROMORPHONE HCL 1 MG/ML IJ SOLN
0.5000 mg | INTRAMUSCULAR | Status: DC | PRN
  Filled 2019-12-10: qty 0.5

## 2019-12-10 MED ORDER — CHLORHEXIDINE GLUCONATE CLOTH 2 % EX PADS
6.0000 | MEDICATED_PAD | Freq: Every day | CUTANEOUS | Status: DC
  Administered 2019-12-10: 6 via TOPICAL

## 2019-12-10 NOTE — Progress Notes (Signed)
PROGRESS NOTE  Carlos [name]  DOB: 1945/12/08  PCP: [name], MD RSW:[id number]  DOA: 12/08/2019  LOS: 0 days   Chief Complaint  Patient presents with  . Seizures   Brief narrative: Patient is a 74 y.o. 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 Patient presented to the [facility] ED on 11/2 via EMS for a breakthrough seizure. Patient had radiation treatment on the day of admission. Family stated he took a nap after the treatment and was difficult to wake up. They also noticed blood in his mouth. EMS brought him to ED.  In the ED, patient was hemodynamically stable. While awaiting his complete workup, patient was found to have generalized tonic-clonic seizure activity which lasted for about a minute. Following the seizure the patient had several minutes of persistent clenching of the right hand and arm and did not follow any commands. He was treated with 2 mg of Ativan IM. He was also loaded with Keppra. He was observed in for several hours after his seizure he continued to be persistently somnolent and nonverbal. He then slowly began to awaken to verbal commands but was somnolent.  CT scan of head was unremarkable. He was loaded with Keppra 2 g and scheduled Keppra was continued at his home dosing regimen of 1 g BID PO.   While having seizure in the ED, patient fell out of his bed and landed on his left hip.  Left hip x-ray showed comminuted, angulated left intratrochanteric hip fracture with avulsion of the lesser trochanter.  Neurology and orthopedic consultation were obtained.  Patient was admitted to hospitalist service at [facility].  Subjective: Patient was seen and examined this morning. Pleasant elderly African-American male. Propped up in bed. Not in distress. Pain controlled. Chart reviewed. No fever, hemodynamically stable.  No labs  today.  Assessment/Plan: Breakthrough seizures History of epilepsy -Unclear trigger of breakthrough seizures. -EEG showed bitemporal spikes consistent with patient known history of seizures.  No seizures were seen throughout the recording. -Postictal status is resolved.  Neurology consult appreciated. -CT scan of head and MRI brain did not show any evidence of intracranial abnormality. -Neurology has increased Keppra to 1250 mg twice daily. -Outpatient neurology follow-up. -Seizure precautions at discharge.  Left Hip Fracture -While having seizure in the ED, patient fell off his bed on his left side.   -X-ray of left hip showed comminuted, angulated left intratrochanteric hip fracture with avulsion of the lesser trochanter.  -Orthopedic consult appreciated.  Tentative plan of surgery tomorrow 11/5.   -P.o. Norco as needed for moderate pain IV Dilaudid as needed for severe pain  Hypokalemia -Improved with replacement.  Recheck Recent Labs  Lab 12/07/19 1005 12/08/19 1708 12/08/19 1753 12/09/19 0408  K 4.2 3.0*  --  4.0  MG  --   --  2.6* 2.2   Hyperammonemia -Initially 182, and then 24 -Unclear significance of the first elevated value, suspect an error.. Recent Labs  Lab 12/08/19 1708 12/08/19 2014  AMMONIA 182* 24   Rectal Adenocarcinoma -Continue home antineoplastic agent with capecitabine 1500 g p.o. twice daily and continue with lidocaine suppository 50 mg rectally every 8 hours as needed for pain -Continue to monitor  Mobility: PT eval after procedure Code Status:   Code Status: Full Code  Nutritional status: Body mass index is 22.06 kg/m.     Diet Order  Diet Heart Room service appropriate? Yes; Fluid consistency: Thin  Diet effective now                 DVT prophylaxis: heparin injection 5,000 Units Start: 12/08/19 2245 SCDs Start: 12/08/19 2231   Antimicrobials:  None Fluid: None Consultants: Orthopedics, neurology Family  Communication:  Not at bedside  Status is: Observation  The patient will require care spanning > 2 midnights and should be moved to inpatient because: Inpatient level of care appropriate due to severity of illness  Dispo: The patient is from: Home              Anticipated d/c is to: Pending procedure and PT eval              Anticipated d/c date is: > 3 days              Patient currently is not medically stable to d/c.       Infusions:    Scheduled Meds: . Chlorhexidine Gluconate Cloth  6 each Topical Daily  . heparin  5,000 Units Subcutaneous Q8H  . levETIRAcetam  1,250 mg Oral BID    Antimicrobials: Anti-infectives (From admission, onward)   None      PRN meds: HYDROcodone-acetaminophen, HYDROmorphone (DILAUDID) injection, Lidocaine (Anorectal), ondansetron **OR** ondansetron (ZOFRAN) IV, zolpidem   Objective: Vitals:   12/10/19 0845 12/10/19 1155  BP: 123/75 108/65  Pulse: 92 97  Resp: 18 18  Temp: 98.3 F (36.8 C) 98.5 F (36.9 C)  SpO2: 99% 100%    Intake/Output Summary (Last 24 hours) at 12/10/2019 1400 Last data filed at 12/10/2019 0629 Gross per 24 hour  Intake --  Output 450 ml  Net -450 ml   Filed Weights   12/09/19 2200 12/10/19 0312  Weight: 63.9 kg 63.9 kg   Weight change:  Body mass index is 22.06 kg/m.   Physical Exam: General exam: Appears calm and comfortable. Not in physical distress Skin: No rashes, lesions or ulcers. HEENT: Atraumatic, normocephalic, supple neck, no obvious bleeding Lungs: Clear to auscultation bilaterally CVS: Regular rate and rhythm, no murmur GI/Abd soft, nontender, nondistended, bowel sound present CNS: Alert, awake, slow to respond but oriented x3. Psychiatry: Mood appropriate Extremities: Short-term none external rotated left lower extremity. Swelling and tenderness present on the left hip  Data Review: I have personally reviewed the laboratory data and studies available.  Recent Labs  Lab  12/07/19 1005 12/08/19 1708 12/09/19 0408  WBC 3.7* 5.8 8.1  NEUTROABS 2.5 4.2 7.0  HGB 12.2* 13.3 10.8*  HCT 36.0* 43.0 32.9*  MCV 91.4 99.8 94.8  PLT 181 229 161   Recent Labs  Lab 12/07/19 1005 12/08/19 1708 12/08/19 1753 12/09/19 0408  NA 139 145  --  138  K 4.2 3.0*  --  4.0  CL 105 102  --  108  CO2 28 17*  --  23  GLUCOSE 105* 136*  --  135*  BUN 9 13  --  14  CREATININE 0.76 0.92  --  0.70  CALCIUM 9.6 10.0  --  8.5*  MG  --   --  2.6* 2.2    F/u labs ordered.  Signed, Terrilee Croak, MD [facility] 12/10/2019

## 2019-12-10 NOTE — H&P (View-Only) (Signed)
Reason for Consult:Left hip fx Referring Physician: B Dahal  Carlos [name] is an 74 y.o. male.  HPI: Carlos [name] was admitted at [facility] with a breakthrough seizure. He had first had seizures about 2 years earlier and was doing well. While at [facility] he had another seizure and fell out of bed. When he came to he c/o left hip pain. X-rays showed a fx and orthopedic surgery was consulted. He c/o localized pain to the hip. He is mildly but persistently confused this morning.  Past Medical History:  Diagnosis Date  . Anxiety   . Benign prostatic hyperplasia 03/30/2013   10/1 IMO update  . Cancer ([place])   . 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 Seizures ([place])     Past Surgical History:  Procedure Laterality Date  . PORTACATH PLACEMENT N/A 07/01/2019   Procedure: PORT ULTRASOUND GUIDED PLACEMENT;  Surgeon: Alphonsa Overall, MD;  Location: [facility];  Service: General;  Laterality: N/A;  . PROSTATE SURGERY  2004  . TONSILECTOMY, ADENOIDECTOMY, BILATERAL MYRINGOTOMY AND TUBES      Family History  Problem Relation Age of Onset  . Osteoporosis Mother   . Dementia Mother   . Diabetes Mother   . Brain cancer Father     Social History:  reports that he has never smoked. He has never used smokeless tobacco. He reports that he does not drink alcohol and does not use drugs.  Allergies: No Known Allergies  Medications: I have reviewed the patient's current medications.  Results for orders placed or performed during the hospital encounter of 12/08/19 (from the past 48 hour(s))  CBC with Differential     Status: Abnormal   Collection Time: 12/08/19  5:08 PM  Result Value Ref Range   WBC 5.8 4.0 - 10.5 K/uL   RBC 4.31 4.22 - 5.81 MIL/uL   Hemoglobin 13.3 13.0 - 17.0 g/dL   HCT 43.0 39 - 52 %   MCV 99.8 80.0 - 100.0 fL   MCH 30.9 26.0 - 34.0 pg   MCHC 30.9 30.0 - 36.0 g/dL   RDW 16.5 (H) 11.5 - 15.5 %   Platelets 229 150 - 400 K/uL   nRBC 0.0 0.0 - 0.2 %   Neutrophils Relative % 72  %   Neutro Abs 4.2 1.7 - 7.7 K/uL   Lymphocytes Relative 16 %   Lymphs Abs 0.9 0.7 - 4.0 K/uL   Monocytes Relative 11 %   Monocytes Absolute 0.6 0.1 - 1.0 K/uL   Eosinophils Relative 0 %   Eosinophils Absolute 0.0 0.0 - 0.5 K/uL   Basophils Relative 0 %   Basophils Absolute 0.0 0.0 - 0.1 K/uL   Immature Granulocytes 1 %   Abs Immature Granulocytes 0.03 0.00 - 0.07 K/uL    Comment: Performed at [facility], [place] [address] [place], [place] [address]  Comprehensive metabolic panel     Status: Abnormal   Collection Time: 12/08/19  5:08 PM  Result Value Ref Range   Sodium 145 135 - 145 mmol/L   Potassium 3.0 (L) 3.5 - 5.1 mmol/L   Chloride 102 98 - 111 mmol/L   CO2 17 (L) 22 - 32 mmol/L   Glucose, Bld 136 (H) 70 - 99 mg/dL    Comment: Glucose reference range applies only to samples taken after fasting for at least 8 hours.   BUN 13 8 - 23 mg/dL   Creatinine, Ser 0.92 0.61 - 1.24 mg/dL   Calcium 10.0 8.9 -  10.3 mg/dL   Total Protein 8.4 (H) 6.5 - 8.1 g/dL   Albumin 4.9 3.5 - 5.0 g/dL   AST 36 15 - 41 U/L   ALT 28 0 - 44 U/L   Alkaline Phosphatase 92 38 - 126 U/L   Total Bilirubin 0.8 0.3 - 1.2 mg/dL   GFR, Estimated >60 >60 mL/min    Comment: (NOTE) Calculated using the CKD-EPI Creatinine Equation (2021)    Anion gap 26 (H) 5 - 15    Comment: REPEATED TO VERIFY Performed at [place] [address], [place], [place] [address]   Ammonia     Status: Abnormal   Collection Time: 12/08/19  5:08 PM  Result Value Ref Range   Ammonia 182 (H) 9 - 35 umol/L    Comment: Performed at [facility], [place] [address], [place], [place] [address]  ABO/Rh     Status: None   Collection Time: 12/08/19  5:08 PM  Result Value Ref Range   ABO/RH(D)      A POS Performed at [facility], [place] [address] [place], [place] [address]   CBG monitoring, ED     Status: Abnormal   Collection Time: 12/08/19  5:46 PM  Result  Value Ref Range   Glucose-Capillary 117 (H) 70 - 99 mg/dL    Comment: Glucose reference range applies only to samples taken after fasting for at least 8 hours.  Magnesium     Status: Abnormal   Collection Time: 12/08/19  5:53 PM  Result Value Ref Range   Magnesium 2.6 (H) 1.7 - 2.4 mg/dL    Comment: Performed at [facility], [place] [address], [place], [place] [address]  Ammonia     Status: None   Collection Time: 12/08/19  8:14 PM  Result Value Ref Range   Ammonia 24 9 - 35 umol/L    Comment: Performed at [facility], [place] [address], [place], [place] [address]  Respiratory Panel by RT PCR (Flu A&B, Covid) - Nasopharyngeal Swab     Status: None   Collection Time: 12/08/19  9:17 PM   Specimen: Nasopharyngeal Swab  Result Value Ref Range   SARS Coronavirus 2 by RT PCR NEGATIVE NEGATIVE    Comment: (NOTE) SARS-CoV-2 target nucleic acids are NOT DETECTED.  The SARS-CoV-2 RNA is generally detectable in upper respiratoy specimens during the acute phase of infection. The lowest concentration of SARS-CoV-2 viral copies this assay can detect is 131 copies/mL. A negative result does not preclude SARS-Cov-2 infection and should not be used as the sole basis for treatment or other patient management decisions. A negative result may occur with  improper specimen collection/handling, submission of specimen other than nasopharyngeal swab, presence of viral mutation(s) within the areas targeted by this assay, and inadequate number of viral copies (<131 copies/mL). A negative result must be combined with clinical observations, patient history, and epidemiological information. The expected result is Negative.  Fact Sheet for Patients:  PinkCheek.be  Fact Sheet for Healthcare Providers:  GravelBags.it  This test is no t yet approved or cleared by the [place] FDA and  has been authorized for  detection and/or diagnosis of SARS-CoV-2 by FDA under an Emergency Use Authorization (EUA). This EUA will remain  in effect (meaning this test can be used) for the duration of the COVID-19 declaration under Section 564(b)(1) of the Act, 21 U.S.C. section 360bbb-3(b)(1), unless the authorization is terminated or revoked sooner.  Influenza A by PCR NEGATIVE NEGATIVE   Influenza B by PCR NEGATIVE NEGATIVE    Comment: (NOTE) The Xpert Xpress SARS-CoV-2/FLU/RSV assay is intended as an aid in  the diagnosis of influenza from Nasopharyngeal swab specimens and  should not be used as a sole basis for treatment. Nasal washings and  aspirates are unacceptable for Xpert Xpress SARS-CoV-2/FLU/RSV  testing.  Fact Sheet for Patients: PinkCheek.be  Fact Sheet for Healthcare Providers: GravelBags.it  This test is not yet approved or cleared by the [place] FDA and  has been authorized for detection and/or diagnosis of SARS-CoV-2 by  FDA under an Emergency Use Authorization (EUA). This EUA will remain  in effect (meaning this test can be used) for the duration of the  Covid-19 declaration under Section 564(b)(1) of the Act, 21  U.S.C. section 360bbb-3(b)(1), unless the authorization is  terminated or revoked. Performed at [facility], [place] [address], [place], [place] [address]   Lactic acid, plasma     Status: None   Collection Time: 12/08/19 10:30 PM  Result Value Ref Range   Lactic Acid, Venous 1.9 0.5 - 1.9 mmol/L    Comment: Performed at [facility], [place] [address], [place], [place] [address]  Lactic acid, plasma     Status: None   Collection Time: 12/09/19  1:19 AM  Result Value Ref Range   Lactic Acid, Venous 1.9 0.5 - 1.9 mmol/L    Comment: Performed at [facility], [place] [address] [place], [place] [address]  Comprehensive metabolic panel     Status: Abnormal    Collection Time: 12/09/19  4:08 AM  Result Value Ref Range   Sodium 138 135 - 145 mmol/L    Comment: DELTA CHECK NOTED   Potassium 4.0 3.5 - 5.1 mmol/L    Comment: DELTA CHECK NOTED   Chloride 108 98 - 111 mmol/L   CO2 23 22 - 32 mmol/L   Glucose, Bld 135 (H) 70 - 99 mg/dL    Comment: Glucose reference range applies only to samples taken after fasting for at least 8 hours.   BUN 14 8 - 23 mg/dL   Creatinine, Ser 0.70 0.61 - 1.24 mg/dL   Calcium 8.5 (L) 8.9 - 10.3 mg/dL   Total Protein 6.3 (L) 6.5 - 8.1 g/dL   Albumin 3.9 3.5 - 5.0 g/dL   AST 28 15 - 41 U/L   ALT 26 0 - 44 U/L   Alkaline Phosphatase 65 38 - 126 U/L   Total Bilirubin 0.5 0.3 - 1.2 mg/dL   GFR, Estimated >60 >60 mL/min    Comment: (NOTE) Calculated using the CKD-EPI Creatinine Equation (2021)    Anion gap 7 5 - 15    Comment: Performed at [facility], [place] [address], [place], [place] [address]  Magnesium     Status: None   Collection Time: 12/09/19  4:08 AM  Result Value Ref Range   Magnesium 2.2 1.7 - 2.4 mg/dL    Comment: Performed at [facility], [place] [address] [place], [place] [address]  CBC WITH DIFFERENTIAL     Status: Abnormal   Collection Time: 12/09/19  4:08 AM  Result Value Ref Range   WBC 8.1 4.0 - 10.5 K/uL   RBC 3.47 (L) 4.22 - 5.81 MIL/uL   Hemoglobin 10.8 (L) 13.0 - 17.0 g/dL   HCT 32.9 (L) 39 - 52 %   MCV 94.8 80.0 - 100.0 fL  MCH 31.1 26.0 - 34.0 pg   MCHC 32.8 30.0 - 36.0 g/dL   RDW 16.7 (H) 11.5 - 15.5 %   Platelets 161 150 - 400 K/uL   nRBC 0.0 0.0 - 0.2 %   Neutrophils Relative % 86 %   Neutro Abs 7.0 1.7 - 7.7 K/uL   Lymphocytes Relative 5 %   Lymphs Abs 0.4 (L) 0.7 - 4.0 K/uL   Monocytes Relative 9 %   Monocytes Absolute 0.8 0.1 - 1.0 K/uL   Eosinophils Relative 0 %   Eosinophils Absolute 0.0 0.0 - 0.5 K/uL   Basophils Relative 0 %   Basophils Absolute 0.0 0.0 - 0.1 K/uL   Immature Granulocytes 0 %   Abs Immature Granulocytes 0.03 0.00  - 0.07 K/uL    Comment: Performed at [facility], [place] [address] [place], [place] [address]  Type and screen [place]     Status: None   Collection Time: 12/09/19  4:08 AM  Result Value Ref Range   ABO/RH(D) A POS    Antibody Screen NEG    Sample Expiration      12/12/2019,2359 Performed at [facility], [place] [address] [place], [place] [address]   Urinalysis, Routine w reflex microscopic Urine, Clean Catch     Status: None   Collection Time: 12/09/19  5:13 AM  Result Value Ref Range   Color, Urine YELLOW YELLOW   APPearance CLEAR CLEAR   Specific Gravity, Urine 1.021 1.005 - 1.030   pH 5.0 5.0 - 8.0   Glucose, UA NEGATIVE NEGATIVE mg/dL   Hgb urine dipstick NEGATIVE NEGATIVE   Bilirubin Urine NEGATIVE NEGATIVE   Ketones, ur NEGATIVE NEGATIVE mg/dL   Protein, ur NEGATIVE NEGATIVE mg/dL   Nitrite NEGATIVE NEGATIVE   Leukocytes,Ua NEGATIVE NEGATIVE    Comment: Performed at [place] [address], [place], [place] [address]    DG Chest 1 View  Result Date: 12/09/2019 CLINICAL DATA:  Preop hip fracture EXAM: CHEST  1 VIEW COMPARISON:  09/11/2019 FINDINGS: Right Port-A-Cath remains in place, unchanged. Heart is normal size. Lungs are clear. No effusions. No acute bony abnormality. IMPRESSION: No active disease. Electronically Signed   By: Rolm Baptise M.D.   On: 12/09/2019 01:31   CT Head Wo Contrast  Result Date: 12/08/2019 CLINICAL DATA:  Altered mental status. EXAM: CT HEAD WITHOUT CONTRAST TECHNIQUE: Contiguous axial images were obtained from the base of the skull through the vertex without intravenous contrast. COMPARISON:  September 11, 2019. FINDINGS: Brain: No evidence of acute infarction, hemorrhage, hydrocephalus, extra-axial collection or mass lesion/mass effect. Vascular: No hyperdense vessel or unexpected calcification. Skull: Normal. Negative for fracture or focal lesion. Sinuses/Orbits: No  acute finding. Other: None. IMPRESSION: Normal head CT. Electronically Signed   By: Marijo Conception M.D.   On: 12/08/2019 18:27   MR BRAIN WO CONTRAST  Result Date: 12/09/2019 CLINICAL DATA:  Seizure.  Abnormal neuro exam. EXAM: MRI HEAD WITHOUT CONTRAST TECHNIQUE: Multiplanar, multiecho pulse sequences of the brain and surrounding structures were obtained without intravenous contrast. COMPARISON:  CT head 11/07/2019.  MRI head 09/11/2019. FINDINGS: Brain: No acute infarction, hemorrhage, hydrocephalus, extra-axial collection or mass lesion. Minimal T2/FLAIR hyperintensities within the white matter, likely related to chronic microvascular ischemic disease. Mild generalized cerebral atrophy with ex vacuo ventricular dilation. Small remote left cerebellar lacunar infarct with focus of susceptibility in this region likely related to prior hemorrhage. Vascular: Major arterial flow voids are maintained  at the skull base. Skull and upper cervical spine: Normal marrow signal. Sinuses/Orbits: Minimal paranasal sinus mucosal thickening. Unremarkable orbits. Other: No mastoid effusions. IMPRESSION: No evidence of acute intracranial abnormality. Electronically Signed   By: Margaretha Sheffield MD   On: 12/09/2019 14:11   DG Knee Left Port  Result Date: 12/09/2019 CLINICAL DATA:  Left femur fracture. EXAM: PORTABLE LEFT KNEE - 1-2 VIEW COMPARISON:  None. FINDINGS: No evidence of fracture, dislocation, or joint effusion. Severe narrowing of medial joint space is noted. Soft tissues are unremarkable. 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 No acute abnormality seen in the left knee. Electronically Signed   By: Marijo Conception M.D.   On: 12/09/2019 12:32   EEG adult  Result Date: 12/09/2019 Lora Havens, MD     12/09/2019  1:57 PM Patient Name: Carlos [name] MRN: [id number] Epilepsy Attending: Lora Havens Referring Physician/Provider: Reed Pandy Date: 12/09/2019 Duration: 25.42  mins Patient history: 74 year old male with history of epilepsy who presented with breakthrough seizure.  EEG to evaluate with seizures. Level of alertness: Awake, asleep AEDs during EEG study: Keppra Technical aspects: This EEG study was done with scalp electrodes positioned according to the 10-20 International system of electrode placement. Electrical activity was acquired at a sampling rate of 500Hz  and reviewed with a high frequency filter of 70Hz  and a low frequency filter of 1Hz . EEG data were recorded continuously and digitally stored. Description: The posterior dominant rhythm consists of 8 Hz activity of moderate voltage (25-35 uV) seen predominantly in posterior head regions, symmetric and reactive to eye opening and eye closing.Sleep was characterized by vertex waves, sleep spindles (12 to 14 Hz), maximal frontocentral region.   EEG showed independent left frontotemporal (maximal Fp1, F7) and right anterior temporal (maximal F8) spikes.  Hyperventilation and photic stimulation were not performed.  ABNORMALITY -Spikes, independent left and right temporal. IMPRESSION: This study is consistent with patient's known history of independent left and right temporal spikes (maximal Fp1, F7 and F8). No seizures were seen throughout the recording. Lora Havens   DG Hip Unilat W or Wo Pelvis 2-3 Views Left  Result Date: 12/09/2019 CLINICAL DATA:  Fall, left hip pain EXAM: DG HIP (WITH OR WITHOUT PELVIS) 2-3V LEFT COMPARISON:  None. FINDINGS: Single view radiograph pelvis and two view radiograph left hip demonstrates an acute, mildly comminuted intratrochanteric fracture of the left hip with avulsion of the lesser trochanter, mild override of the major fracture fragments, and mild varus angulation. The femoral head is still seated within the left acetabulum. There is mild superimposed bilateral degenerative hip arthritis with joint space narrowing noted. The pelvis and sacrum are intact. Limited evaluation of  the right hip is unremarkable. IMPRESSION: Comminuted, angulated left intratrochanteric hip fracture with avulsion of the lesser trochanter. Electronically Signed   By: Fidela Salisbury MD   On: 12/09/2019 00:11    Review of Systems  HENT: Negative for ear discharge, ear pain, hearing loss and tinnitus.   Eyes: Negative for photophobia and pain.  Respiratory: Negative for cough and shortness of breath.   Cardiovascular: Negative for chest pain.  Gastrointestinal: Negative for abdominal pain, nausea and vomiting.  Genitourinary: Negative for dysuria, flank pain, frequency and urgency.  Musculoskeletal: Positive for arthralgias (Left hip). Negative for back pain, myalgias and neck pain.  Neurological: Positive for seizures. Negative for dizziness and headaches.  Hematological: Does not bruise/bleed easily.  Psychiatric/Behavioral: The patient is not nervous/anxious.    Blood pressure 120/75,  pulse 88, temperature 98.3 F (36.8 C), temperature source Oral, resp. rate 16, height 5\' 7"  (1.702 m), weight 63.9 kg, SpO2 100 %. Physical Exam Constitutional:      General: He is not in acute distress.    Appearance: He is well-developed. He is not diaphoretic.  HENT:     Head: Normocephalic and atraumatic.  Eyes:     General: No scleral icterus.       Right eye: No discharge.        Left eye: No discharge.     Conjunctiva/sclera: Conjunctivae normal.  Cardiovascular:     Rate and Rhythm: Normal rate and regular rhythm.  Pulmonary:     Effort: Pulmonary effort is normal. No respiratory distress.  Musculoskeletal:     Cervical back: Normal range of motion.     Comments: LLE No traumatic wounds, ecchymosis, or rash  Mild TTP hip  No knee or ankle effusion  Knee stable to varus/ valgus and anterior/posterior stress  Sens DPN, SPN, TN intact  Motor EHL, ext, flex, evers 5/5  DP 2+, PT 2+, No significant edema  Skin:    General: Skin is warm and dry.  Neurological:     Mental Status: He is  alert.  Psychiatric:        Behavior: Behavior normal.     Assessment/Plan: Left hip fx -- Plan cannulated hip pinning in the AM by Dr. Lyla Glassing. Please keep NPO. Multiple medical problems including seizure disorder, mild neurocognitive disorder of unclear etiology, rectal adenocarcinoma, BPH, and anxiety -- per primary service    Lisette Abu, PA-C Orthopedic Surgery [phone] 12/10/2019, 8:44 AM

## 2019-12-10 NOTE — Plan of Care (Signed)
  Problem: Education: Goal: Knowledge of General Education information will improve Description: Including pain rating scale, medication(s)/side effects and non-pharmacologic comfort measures Outcome: Not Progressing   Problem: Health Behavior/Discharge Planning: Goal: Ability to manage health-related needs will improve Outcome: Not Progressing   Problem: Clinical Measurements: Goal: Ability to maintain clinical measurements within normal limits will improve Outcome: Not Progressing Goal: Will remain free from infection Outcome: Not Progressing Goal: Diagnostic test results will improve Outcome: Not Progressing Goal: Respiratory complications will improve Outcome: Not Progressing Goal: Cardiovascular complication will be avoided Outcome: Not Progressing   Problem: Activity: Goal: Risk for activity intolerance will decrease Outcome: Not Progressing   Problem: Nutrition: Goal: Adequate nutrition will be maintained Outcome: Not Progressing  Patient is currently experiencing intermittent confusion and altered mental status. He is unable to progress towards goals at this time.  Problem: Coping: Goal: Level of anxiety will decrease Outcome: Not Progressing   Problem: Elimination: Goal: Will not experience complications related to bowel motility Outcome: Not Progressing Goal: Will not experience complications related to urinary retention Outcome: Not Progressing   Problem: Pain Managment: Goal: General experience of comfort will improve Outcome: Not Progressing   Problem: Safety: Goal: Ability to remain free from injury will improve Outcome: Not Progressing   Problem: Skin Integrity: Goal: Risk for impaired skin integrity will decrease Outcome: Not Progressing

## 2019-12-10 NOTE — Progress Notes (Addendum)
I called the patient's sister [name] and reviewed that Dr. Lisbeth Renshaw was aware of Mr. Olivo' situation and current hospitalization and recommends forgoing the last treatment to the pelvis given his fracture and neurological condition. We will plan to follow up in 4 weeks. I've reached out to his surgeon, Dr. Morton Stall at [facility] for his upcoming rectal cancer surgery which will likely take place in the next two to two and a half months.     Carola Rhine, PAC

## 2019-12-10 NOTE — Telephone Encounter (Signed)
Entry delayed but I spoke with the patient's sister Carlos [name] yesterday to review the condition that Carlos [name] was in. We agreed to cancel Wednesday and Thursday's radiation, but were not sure at the time the plan for surgical pinning of his femur. He will now have this performed tomorrow (Friday). Dr. Lisbeth Renshaw recommends forgoing remaining fraction of radiotherapy given his ongoing neurologic and orthopedic needs.     Carola Rhine, PAC

## 2019-12-10 NOTE — Telephone Encounter (Signed)
Scheduled per 11/1 los. Unable to reach pt. Mailing pt appt calendar.

## 2019-12-10 NOTE — Consult Note (Signed)
Reason for Consult:Left hip fx Referring Physician: B Dahal  Carlos [name] is an 74 y.o. male.  HPI: Carlos [name] was admitted at [facility] with a breakthrough seizure. He had first had seizures about 2 years earlier and was doing well. While at [facility] he had another seizure and fell out of bed. When he came to he c/o left hip pain. X-rays showed a fx and orthopedic surgery was consulted. He c/o localized pain to the hip. He is mildly but persistently confused this morning.  Past Medical History:  Diagnosis Date  . Anxiety   . Benign prostatic hyperplasia 03/30/2013   10/1 IMO update  . Cancer ([place])   . 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 Seizures ([place])     Past Surgical History:  Procedure Laterality Date  . PORTACATH PLACEMENT N/A 07/01/2019   Procedure: PORT ULTRASOUND GUIDED PLACEMENT;  Surgeon: Alphonsa Overall, MD;  Location: [facility];  Service: General;  Laterality: N/A;  . PROSTATE SURGERY  2004  . TONSILECTOMY, ADENOIDECTOMY, BILATERAL MYRINGOTOMY AND TUBES      Family History  Problem Relation Age of Onset  . Osteoporosis Mother   . Dementia Mother   . Diabetes Mother   . Brain cancer Father     Social History:  reports that he has never smoked. He has never used smokeless tobacco. He reports that he does not drink alcohol and does not use drugs.  Allergies: No Known Allergies  Medications: I have reviewed the patient's current medications.  Results for orders placed or performed during the hospital encounter of 12/08/19 (from the past 48 hour(s))  CBC with Differential     Status: Abnormal   Collection Time: 12/08/19  5:08 PM  Result Value Ref Range   WBC 5.8 4.0 - 10.5 K/uL   RBC 4.31 4.22 - 5.81 MIL/uL   Hemoglobin 13.3 13.0 - 17.0 g/dL   HCT 43.0 39 - 52 %   MCV 99.8 80.0 - 100.0 fL   MCH 30.9 26.0 - 34.0 pg   MCHC 30.9 30.0 - 36.0 g/dL   RDW 16.5 (H) 11.5 - 15.5 %   Platelets 229 150 - 400 K/uL   nRBC 0.0 0.0 - 0.2 %   Neutrophils Relative % 72  %   Neutro Abs 4.2 1.7 - 7.7 K/uL   Lymphocytes Relative 16 %   Lymphs Abs 0.9 0.7 - 4.0 K/uL   Monocytes Relative 11 %   Monocytes Absolute 0.6 0.1 - 1.0 K/uL   Eosinophils Relative 0 %   Eosinophils Absolute 0.0 0.0 - 0.5 K/uL   Basophils Relative 0 %   Basophils Absolute 0.0 0.0 - 0.1 K/uL   Immature Granulocytes 1 %   Abs Immature Granulocytes 0.03 0.00 - 0.07 K/uL    Comment: Performed at [facility], [place] [address], [place], [place] [address]  Comprehensive metabolic panel     Status: Abnormal   Collection Time: 12/08/19  5:08 PM  Result Value Ref Range   Sodium 145 135 - 145 mmol/L   Potassium 3.0 (L) 3.5 - 5.1 mmol/L   Chloride 102 98 - 111 mmol/L   CO2 17 (L) 22 - 32 mmol/L   Glucose, Bld 136 (H) 70 - 99 mg/dL    Comment: Glucose reference range applies only to samples taken after fasting for at least 8 hours.   BUN 13 8 - 23 mg/dL   Creatinine, Ser 0.92 0.61 - 1.24 mg/dL   Calcium 10.0 8.9 -  10.3 mg/dL   Total Protein 8.4 (H) 6.5 - 8.1 g/dL   Albumin 4.9 3.5 - 5.0 g/dL   AST 36 15 - 41 U/L   ALT 28 0 - 44 U/L   Alkaline Phosphatase 92 38 - 126 U/L   Total Bilirubin 0.8 0.3 - 1.2 mg/dL   GFR, Estimated >60 >60 mL/min    Comment: (NOTE) Calculated using the CKD-EPI Creatinine Equation (2021)    Anion gap 26 (H) 5 - 15    Comment: REPEATED TO VERIFY Performed at [place] [address] [place], [place] [address]   Ammonia     Status: Abnormal   Collection Time: 12/08/19  5:08 PM  Result Value Ref Range   Ammonia 182 (H) 9 - 35 umol/L    Comment: Performed at [facility], [place] [address] [place], [place] [address]  ABO/Rh     Status: None   Collection Time: 12/08/19  5:08 PM  Result Value Ref Range   ABO/RH(D)      A POS Performed at [facility], [place] [address] [place], [place] [address]   CBG monitoring, ED     Status: Abnormal   Collection Time: 12/08/19  5:46 PM  Result  Value Ref Range   Glucose-Capillary 117 (H) 70 - 99 mg/dL    Comment: Glucose reference range applies only to samples taken after fasting for at least 8 hours.  Magnesium     Status: Abnormal   Collection Time: 12/08/19  5:53 PM  Result Value Ref Range   Magnesium 2.6 (H) 1.7 - 2.4 mg/dL    Comment: Performed at [facility], [place] [address] [place], [place] [address]  Ammonia     Status: None   Collection Time: 12/08/19  8:14 PM  Result Value Ref Range   Ammonia 24 9 - 35 umol/L    Comment: Performed at [facility], [place] [address] [place], [place] [address]  Respiratory Panel by RT PCR (Flu A&B, Covid) - Nasopharyngeal Swab     Status: None   Collection Time: 12/08/19  9:17 PM   Specimen: Nasopharyngeal Swab  Result Value Ref Range   SARS Coronavirus 2 by RT PCR NEGATIVE NEGATIVE    Comment: (NOTE) SARS-CoV-2 target nucleic acids are NOT DETECTED.  The SARS-CoV-2 RNA is generally detectable in upper respiratoy specimens during the acute phase of infection. The lowest concentration of SARS-CoV-2 viral copies this assay can detect is 131 copies/mL. A negative result does not preclude SARS-Cov-2 infection and should not be used as the sole basis for treatment or other patient management decisions. A negative result may occur with  improper specimen collection/handling, submission of specimen other than nasopharyngeal swab, presence of viral mutation(s) within the areas targeted by this assay, and inadequate number of viral copies (<131 copies/mL). A negative result must be combined with clinical observations, patient history, and epidemiological information. The expected result is Negative.  Fact Sheet for Patients:  PinkCheek.be  Fact Sheet for Healthcare Providers:  GravelBags.it  This test is no t yet approved or cleared by the [place] FDA and  has been authorized for  detection and/or diagnosis of SARS-CoV-2 by FDA under an Emergency Use Authorization (EUA). This EUA will remain  in effect (meaning this test can be used) for the duration of the COVID-19 declaration under Section 564(b)(1) of the Act, 21 U.S.C. section 360bbb-3(b)(1), unless the authorization is terminated or revoked sooner.  Influenza A by PCR NEGATIVE NEGATIVE   Influenza B by PCR NEGATIVE NEGATIVE    Comment: (NOTE) The Xpert Xpress SARS-CoV-2/FLU/RSV assay is intended as an aid in  the diagnosis of influenza from Nasopharyngeal swab specimens and  should not be used as a sole basis for treatment. Nasal washings and  aspirates are unacceptable for Xpert Xpress SARS-CoV-2/FLU/RSV  testing.  Fact Sheet for Patients: PinkCheek.be  Fact Sheet for Healthcare Providers: GravelBags.it  This test is not yet approved or cleared by the [place] FDA and  has been authorized for detection and/or diagnosis of SARS-CoV-2 by  FDA under an Emergency Use Authorization (EUA). This EUA will remain  in effect (meaning this test can be used) for the duration of the  Covid-19 declaration under Section 564(b)(1) of the Act, 21  U.S.C. section 360bbb-3(b)(1), unless the authorization is  terminated or revoked. Performed at [facility], [place] [address] [place], [place] [address]   Lactic acid, plasma     Status: None   Collection Time: 12/08/19 10:30 PM  Result Value Ref Range   Lactic Acid, Venous 1.9 0.5 - 1.9 mmol/L    Comment: Performed at [facility], [place] [address] [place], [place] [address]  Lactic acid, plasma     Status: None   Collection Time: 12/09/19  1:19 AM  Result Value Ref Range   Lactic Acid, Venous 1.9 0.5 - 1.9 mmol/L    Comment: Performed at [facility], [place] [address] [place], [place] [address]  Comprehensive metabolic panel     Status: Abnormal    Collection Time: 12/09/19  4:08 AM  Result Value Ref Range   Sodium 138 135 - 145 mmol/L    Comment: DELTA CHECK NOTED   Potassium 4.0 3.5 - 5.1 mmol/L    Comment: DELTA CHECK NOTED   Chloride 108 98 - 111 mmol/L   CO2 23 22 - 32 mmol/L   Glucose, Bld 135 (H) 70 - 99 mg/dL    Comment: Glucose reference range applies only to samples taken after fasting for at least 8 hours.   BUN 14 8 - 23 mg/dL   Creatinine, Ser 0.70 0.61 - 1.24 mg/dL   Calcium 8.5 (L) 8.9 - 10.3 mg/dL   Total Protein 6.3 (L) 6.5 - 8.1 g/dL   Albumin 3.9 3.5 - 5.0 g/dL   AST 28 15 - 41 U/L   ALT 26 0 - 44 U/L   Alkaline Phosphatase 65 38 - 126 U/L   Total Bilirubin 0.5 0.3 - 1.2 mg/dL   GFR, Estimated >60 >60 mL/min    Comment: (NOTE) Calculated using the CKD-EPI Creatinine Equation (2021)    Anion gap 7 5 - 15    Comment: Performed at [facility], [place] [address], [place], [place] [address]  Magnesium     Status: None   Collection Time: 12/09/19  4:08 AM  Result Value Ref Range   Magnesium 2.2 1.7 - 2.4 mg/dL    Comment: Performed at [facility], [place] [address] [place], [place] [address]  CBC WITH DIFFERENTIAL     Status: Abnormal   Collection Time: 12/09/19  4:08 AM  Result Value Ref Range   WBC 8.1 4.0 - 10.5 K/uL   RBC 3.47 (L) 4.22 - 5.81 MIL/uL   Hemoglobin 10.8 (L) 13.0 - 17.0 g/dL   HCT 32.9 (L) 39 - 52 %   MCV 94.8 80.0 - 100.0 fL  MCH 31.1 26.0 - 34.0 pg   MCHC 32.8 30.0 - 36.0 g/dL   RDW 16.7 (H) 11.5 - 15.5 %   Platelets 161 150 - 400 K/uL   nRBC 0.0 0.0 - 0.2 %   Neutrophils Relative % 86 %   Neutro Abs 7.0 1.7 - 7.7 K/uL   Lymphocytes Relative 5 %   Lymphs Abs 0.4 (L) 0.7 - 4.0 K/uL   Monocytes Relative 9 %   Monocytes Absolute 0.8 0.1 - 1.0 K/uL   Eosinophils Relative 0 %   Eosinophils Absolute 0.0 0.0 - 0.5 K/uL   Basophils Relative 0 %   Basophils Absolute 0.0 0.0 - 0.1 K/uL   Immature Granulocytes 0 %   Abs Immature Granulocytes 0.03 0.00  - 0.07 K/uL    Comment: Performed at [facility], [place] [address], [place], [place] [address]  Type and screen [place]     Status: None   Collection Time: 12/09/19  4:08 AM  Result Value Ref Range   ABO/RH(D) A POS    Antibody Screen NEG    Sample Expiration      12/12/2019,2359 Performed at [facility], [place] [address] [place], [place] [address]   Urinalysis, Routine w reflex microscopic Urine, Clean Catch     Status: None   Collection Time: 12/09/19  5:13 AM  Result Value Ref Range   Color, Urine YELLOW YELLOW   APPearance CLEAR CLEAR   Specific Gravity, Urine 1.021 1.005 - 1.030   pH 5.0 5.0 - 8.0   Glucose, UA NEGATIVE NEGATIVE mg/dL   Hgb urine dipstick NEGATIVE NEGATIVE   Bilirubin Urine NEGATIVE NEGATIVE   Ketones, ur NEGATIVE NEGATIVE mg/dL   Protein, ur NEGATIVE NEGATIVE mg/dL   Nitrite NEGATIVE NEGATIVE   Leukocytes,Ua NEGATIVE NEGATIVE    Comment: Performed at [place] [address], [place],  [address]    DG Chest 1 View  Result Date: 12/09/2019 CLINICAL DATA:  Preop hip fracture EXAM: CHEST  1 VIEW COMPARISON:  09/11/2019 FINDINGS: Right Port-A-Cath remains in place, unchanged. Heart is normal size. Lungs are clear. No effusions. No acute bony abnormality. IMPRESSION: No active disease. Electronically Signed   By: Rolm Baptise M.D.   On: 12/09/2019 01:31   CT Head Wo Contrast  Result Date: 12/08/2019 CLINICAL DATA:  Altered mental status. EXAM: CT HEAD WITHOUT CONTRAST TECHNIQUE: Contiguous axial images were obtained from the base of the skull through the vertex without intravenous contrast. COMPARISON:  September 11, 2019. FINDINGS: Brain: No evidence of acute infarction, hemorrhage, hydrocephalus, extra-axial collection or mass lesion/mass effect. Vascular: No hyperdense vessel or unexpected calcification. Skull: Normal. Negative for fracture or focal lesion. Sinuses/Orbits: No  acute finding. Other: None. IMPRESSION: Normal head CT. Electronically Signed   By: Marijo Conception M.D.   On: 12/08/2019 18:27   MR BRAIN WO CONTRAST  Result Date: 12/09/2019 CLINICAL DATA:  Seizure.  Abnormal neuro exam. EXAM: MRI HEAD WITHOUT CONTRAST TECHNIQUE: Multiplanar, multiecho pulse sequences of the brain and surrounding structures were obtained without intravenous contrast. COMPARISON:  CT head 11/07/2019.  MRI head 09/11/2019. FINDINGS: Brain: No acute infarction, hemorrhage, hydrocephalus, extra-axial collection or mass lesion. Minimal T2/FLAIR hyperintensities within the white matter, likely related to chronic microvascular ischemic disease. Mild generalized cerebral atrophy with ex vacuo ventricular dilation. Small remote left cerebellar lacunar infarct with focus of susceptibility in this region likely related to prior hemorrhage. Vascular: Major arterial flow voids are maintained  at the skull base. Skull and upper cervical spine: Normal marrow signal. Sinuses/Orbits: Minimal paranasal sinus mucosal thickening. Unremarkable orbits. Other: No mastoid effusions. IMPRESSION: No evidence of acute intracranial abnormality. Electronically Signed   By: Margaretha Sheffield MD   On: 12/09/2019 14:11   DG Knee Left Port  Result Date: 12/09/2019 CLINICAL DATA:  Left femur fracture. EXAM: PORTABLE LEFT KNEE - 1-2 VIEW COMPARISON:  None. FINDINGS: No evidence of fracture, dislocation, or joint effusion. Severe narrowing of medial joint space is noted. Soft tissues are unremarkable. 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 No acute abnormality seen in the left knee. Electronically Signed   By: Marijo Conception M.D.   On: 12/09/2019 12:32   EEG adult  Result Date: 12/09/2019 Lora Havens, MD     12/09/2019  1:57 PM Patient Name: ELIHUE EBERT MRN: [id number] Epilepsy Attending: Lora Havens Referring Physician/Provider: Reed Pandy Date: 12/09/2019 Duration: 25.42  mins Patient history: 74 year old male with history of epilepsy who presented with breakthrough seizure.  EEG to evaluate with seizures. Level of alertness: Awake, asleep AEDs during EEG study: [place] Technical aspects: This EEG study was done with scalp electrodes positioned according to the 10-20 International system of electrode placement. Electrical activity was acquired at a sampling rate of 500Hz  and reviewed with a high frequency filter of 70Hz  and a low frequency filter of 1Hz . EEG data were recorded continuously and digitally stored. Description: The posterior dominant rhythm consists of 8 Hz activity of moderate voltage (25-35 uV) seen predominantly in posterior head regions, symmetric and reactive to eye opening and eye closing.Sleep was characterized by vertex waves, sleep spindles (12 to 14 Hz), maximal frontocentral region.   EEG showed independent left frontotemporal (maximal Fp1, F7) and right anterior temporal (maximal F8) spikes.  Hyperventilation and photic stimulation were not performed.  ABNORMALITY -Spikes, independent left and right temporal. IMPRESSION: This study is consistent with patient's known history of independent left and right temporal spikes (maximal Fp1, F7 and F8). No seizures were seen throughout the recording. Lora Havens   DG Hip Unilat W or Wo Pelvis 2-3 Views Left  Result Date: 12/09/2019 CLINICAL DATA:  Fall, left hip pain EXAM: DG HIP (WITH OR WITHOUT PELVIS) 2-3V LEFT COMPARISON:  None. FINDINGS: Single view radiograph pelvis and two view radiograph left hip demonstrates an acute, mildly comminuted intratrochanteric fracture of the left hip with avulsion of the lesser trochanter, mild override of the major fracture fragments, and mild varus angulation. The femoral head is still seated within the left acetabulum. There is mild superimposed bilateral degenerative hip arthritis with joint space narrowing noted. The pelvis and sacrum are intact. Limited evaluation of  the right hip is unremarkable. IMPRESSION: Comminuted, angulated left intratrochanteric hip fracture with avulsion of the lesser trochanter. Electronically Signed   By: Fidela Salisbury MD   On: 12/09/2019 00:11    Review of Systems  HENT: Negative for ear discharge, ear pain, hearing loss and tinnitus.   Eyes: Negative for photophobia and pain.  Respiratory: Negative for cough and shortness of breath.   Cardiovascular: Negative for chest pain.  Gastrointestinal: Negative for abdominal pain, nausea and vomiting.  Genitourinary: Negative for dysuria, flank pain, frequency and urgency.  Musculoskeletal: Positive for arthralgias (Left hip). Negative for back pain, myalgias and neck pain.  Neurological: Positive for seizures. Negative for dizziness and headaches.  Hematological: Does not bruise/bleed easily.  Psychiatric/Behavioral: The patient is not nervous/anxious.    Blood pressure 120/75,  pulse 88, temperature 98.3 F (36.8 C), temperature source Oral, resp. rate 16, height 5\' 7"  (1.702 m), weight 63.9 kg, SpO2 100 %. Physical Exam Constitutional:      General: He is not in acute distress.    Appearance: He is well-developed. He is not diaphoretic.  HENT:     Head: Normocephalic and atraumatic.  Eyes:     General: No scleral icterus.       Right eye: No discharge.        Left eye: No discharge.     Conjunctiva/sclera: Conjunctivae normal.  Cardiovascular:     Rate and Rhythm: Normal rate and regular rhythm.  Pulmonary:     Effort: Pulmonary effort is normal. No respiratory distress.  Musculoskeletal:     Cervical back: Normal range of motion.     Comments: LLE No traumatic wounds, ecchymosis, or rash  Mild TTP hip  No knee or ankle effusion  Knee stable to varus/ valgus and anterior/posterior stress  Sens DPN, SPN, TN intact  Motor EHL, ext, flex, evers 5/5  DP 2+, PT 2+, No significant edema  Skin:    General: Skin is warm and dry.  Neurological:     Mental Status: He is  alert.  Psychiatric:        Behavior: Behavior normal.     Assessment/Plan: Left hip fx -- Plan cannulated hip pinning in the AM by Dr. Lyla Glassing. Please keep NPO. Multiple medical problems including seizure disorder, mild neurocognitive disorder of unclear etiology, rectal adenocarcinoma, BPH, and anxiety -- per primary service    Lisette Abu, PA-C Orthopedic Surgery [phone] 12/10/2019, 8:44 AM

## 2019-12-10 NOTE — Progress Notes (Signed)
Carlos [name]   DOB:[birth year]   YQ#:[id number]   GLO#:[id number]  Oncology service   Subjective: pt is well known to me, under my care for his rectal cancer. He was scheduled to complete neoadjuvant chemoRT today, but was admitted yesterday for seizure, and unfortunately had fall and broke his left hip in [name] ED.    Objective:  Vitals:   12/10/19 1618 12/10/19 1930  BP: 136/78 120/71  Pulse: 91 100  Resp: 18 18  Temp: 98.4 F (36.9 C) 99.6 F (37.6 C)  SpO2: 100% 99%    Body mass index is 22.06 kg/m.  Intake/Output Summary (Last 24 hours) at 12/10/2019 2048 Last data filed at 12/10/2019 1624 Gross per 24 hour  Intake --  Output 850 ml  Net -850 ml     Sclerae unicteric  Oropharynx clear  No peripheral adenopathy  Lungs clear -- no rales or rhonchi  Heart regular rate and rhythm  Abdomen benign  Neuro nonfocal    CBG (last 3)  Recent Labs    12/08/19 1746  GLUCAP 117*     Labs:  Urine Studies No results for input(s): UHGB, CRYS in the last 72 hours.  Invalid input(s): UACOL, UAPR, USPG, UPH, UTP, UGL, UKET, UBIL, UNIT, UROB, ULEU, UEPI, UWBC, URBC, UBAC, CAST, UCOM, [place]  Basic Metabolic Panel: Recent Labs  Lab 12/07/19 1005 12/07/19 1005 12/08/19 1708 12/08/19 1753 12/09/19 0408  NA 139  --  145  --  138  K 4.2   < > 3.0*  --  4.0  CL 105  --  102  --  108  CO2 28  --  17*  --  23  GLUCOSE 105*  --  136*  --  135*  BUN 9  --  13  --  14  CREATININE 0.76  --  0.92  --  0.70  CALCIUM 9.6  --  10.0  --  8.5*  MG  --   --   --  2.6* 2.2   < > = values in this interval not displayed.   GFR Estimated Creatinine Clearance: 73.2 mL/min (by C-G formula based on SCr of 0.7 mg/dL). Liver Function Tests: Recent Labs  Lab 12/07/19 1005 12/08/19 1708 12/09/19 0408  AST 31 36 28  ALT 24 28 26   ALKPHOS 87 92 65  BILITOT 0.6 0.8 0.5  PROT 7.2 8.4* 6.3*  ALBUMIN 4.0 4.9 3.9   No results for input(s): LIPASE, AMYLASE in the last 168 hours. Recent Labs   Lab 12/08/19 1708 12/08/19 2014  AMMONIA 182* 24   Coagulation profile No results for input(s): INR, PROTIME in the last 168 hours.  CBC: Recent Labs  Lab 12/07/19 1005 12/08/19 1708 12/09/19 0408  WBC 3.7* 5.8 8.1  NEUTROABS 2.5 4.2 7.0  HGB 12.2* 13.3 10.8*  HCT 36.0* 43.0 32.9*  MCV 91.4 99.8 94.8  PLT 181 229 161   Cardiac Enzymes: No results for input(s): CKTOTAL, CKMB, CKMBINDEX, TROPONINI in the last 168 hours. BNP: Invalid input(s): POCBNP CBG: Recent Labs  Lab 12/08/19 1746  GLUCAP 117*   D-Dimer No results for input(s): DDIMER in the last 72 hours. Hgb A1c No results for input(s): HGBA1C in the last 72 hours. Lipid Profile No results for input(s): CHOL, HDL, LDLCALC, TRIG, CHOLHDL, LDLDIRECT in the last 72 hours. Thyroid function studies No results for input(s): TSH, T4TOTAL, T3FREE, THYROIDAB in the last 72 hours.  Invalid input(s): FREET3 Anemia work up No results for input(s): VITAMINB12, FOLATE,  FERRITIN, TIBC, IRON, RETICCTPCT in the last 72 hours. Microbiology Recent Results (from the past 240 hour(s))  Respiratory Panel by RT PCR (Flu A&B, Covid) - Nasopharyngeal Swab     Status: None   Collection Time: 12/08/19  9:17 PM   Specimen: Nasopharyngeal Swab  Result Value Ref Range Status   SARS Coronavirus 2 by RT PCR NEGATIVE NEGATIVE Final    Comment: (NOTE) SARS-CoV-2 target nucleic acids are NOT DETECTED.  The SARS-CoV-2 RNA is generally detectable in upper respiratoy specimens during the acute phase of infection. The lowest concentration of SARS-CoV-2 viral copies this assay can detect is 131 copies/mL. A negative result does not preclude SARS-Cov-2 infection and should not be used as the sole basis for treatment or other patient management decisions. A negative result may occur with  improper specimen collection/handling, submission of specimen other than nasopharyngeal swab, presence of viral mutation(s) within the areas targeted by  this assay, and inadequate number of viral copies (<131 copies/mL). A negative result must be combined with clinical observations, patient history, and epidemiological information. The expected result is Negative.  Fact Sheet for Patients:  PinkCheek.be  Fact Sheet for Healthcare Providers:  GravelBags.it  This test is no t yet approved or cleared by the [place] FDA and  has been authorized for detection and/or diagnosis of SARS-CoV-2 by FDA under an Emergency Use Authorization (EUA). This EUA will remain  in effect (meaning this test can be used) for the duration of the COVID-19 declaration under Section 564(b)(1) of the Act, 21 U.S.C. section 360bbb-3(b)(1), unless the authorization is terminated or revoked sooner.     Influenza A by PCR NEGATIVE NEGATIVE Final   Influenza B by PCR NEGATIVE NEGATIVE Final    Comment: (NOTE) The Xpert Xpress SARS-CoV-2/FLU/RSV assay is intended as an aid in  the diagnosis of influenza from Nasopharyngeal swab specimens and  should not be used as a sole basis for treatment. Nasal washings and  aspirates are unacceptable for Xpert Xpress SARS-CoV-2/FLU/RSV  testing.  Fact Sheet for Patients: PinkCheek.be  Fact Sheet for Healthcare Providers: GravelBags.it  This test is not yet approved or cleared by the [place] FDA and  has been authorized for detection and/or diagnosis of SARS-CoV-2 by  FDA under an Emergency Use Authorization (EUA). This EUA will remain  in effect (meaning this test can be used) for the duration of the  Covid-19 declaration under Section 564(b)(1) of the Act, 21  U.S.C. section 360bbb-3(b)(1), unless the authorization is  terminated or revoked. Performed at [facility], [place] [address], [place], [place] [address]       Studies:  DG Chest 1 View  Result Date:  12/09/2019 CLINICAL DATA:  Preop hip fracture EXAM: CHEST  1 VIEW COMPARISON:  09/11/2019 FINDINGS: Right Port-A-Cath remains in place, unchanged. Heart is normal size. Lungs are clear. No effusions. No acute bony abnormality. IMPRESSION: No active disease. Electronically Signed   By: Rolm Baptise M.D.   On: 12/09/2019 01:31   MR BRAIN WO CONTRAST  Result Date: 12/09/2019 CLINICAL DATA:  Seizure.  Abnormal neuro exam. EXAM: MRI HEAD WITHOUT CONTRAST TECHNIQUE: Multiplanar, multiecho pulse sequences of the brain and surrounding structures were obtained without intravenous contrast. COMPARISON:  CT head 11/07/2019.  MRI head 09/11/2019. FINDINGS: Brain: No acute infarction, hemorrhage, hydrocephalus, extra-axial collection or mass lesion. Minimal T2/FLAIR hyperintensities within the white matter, likely related to chronic microvascular ischemic disease. Mild generalized cerebral atrophy with ex vacuo ventricular dilation. Small remote left  cerebellar lacunar infarct with focus of susceptibility in this region likely related to prior hemorrhage. Vascular: Major arterial flow voids are maintained at the skull base. Skull and upper cervical spine: Normal marrow signal. Sinuses/Orbits: Minimal paranasal sinus mucosal thickening. Unremarkable orbits. Other: No mastoid effusions. IMPRESSION: No evidence of acute intracranial abnormality. Electronically Signed   By: Margaretha Sheffield MD   On: 12/09/2019 14:11   DG Knee Left Port  Result Date: 12/09/2019 CLINICAL DATA:  Left femur fracture. EXAM: PORTABLE LEFT KNEE - 1-2 VIEW COMPARISON:  None. FINDINGS: No evidence of fracture, dislocation, or joint effusion. Severe narrowing of medial joint space is noted. Soft tissues are unremarkable. 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 No acute abnormality seen in the left knee. Electronically Signed   By: Marijo Conception M.D.   On: 12/09/2019 12:32   EEG adult  Result Date: 12/09/2019 Lora Havens, MD     12/09/2019  1:57 PM Patient Name: ALEZANDER DIMAANO MRN: [id number] Epilepsy Attending: Lora Havens Referring Physician/Provider: Reed Pandy Date: 12/09/2019 Duration: 25.42 mins Patient history: 74 year old male with history of epilepsy who presented with breakthrough seizure.  EEG to evaluate with seizures. Level of alertness: Awake, asleep AEDs during EEG study: Keppra Technical aspects: This EEG study was done with scalp electrodes positioned according to the 10-20 International system of electrode placement. Electrical activity was acquired at a sampling rate of 500Hz  and reviewed with a high frequency filter of 70Hz  and a low frequency filter of 1Hz . EEG data were recorded continuously and digitally stored. Description: The posterior dominant rhythm consists of 8 Hz activity of moderate voltage (25-35 uV) seen predominantly in posterior head regions, symmetric and reactive to eye opening and eye closing.Sleep was characterized by vertex waves, sleep spindles (12 to 14 Hz), maximal frontocentral region.   EEG showed independent left frontotemporal (maximal Fp1, F7) and right anterior temporal (maximal F8) spikes.  Hyperventilation and photic stimulation were not performed.  ABNORMALITY -Spikes, independent left and right temporal. IMPRESSION: This study is consistent with patient's known history of independent left and right temporal spikes (maximal Fp1, F7 and F8). No seizures were seen throughout the recording. Lora Havens   DG Hip Unilat W or Wo Pelvis 2-3 Views Left  Result Date: 12/09/2019 CLINICAL DATA:  Fall, left hip pain EXAM: DG HIP (WITH OR WITHOUT PELVIS) 2-3V LEFT COMPARISON:  None. FINDINGS: Single view radiograph pelvis and two view radiograph left hip demonstrates an acute, mildly comminuted intratrochanteric fracture of the left hip with avulsion of the lesser trochanter, mild override of the major fracture fragments, and mild varus angulation. The femoral  head is still seated within the left acetabulum. There is mild superimposed bilateral degenerative hip arthritis with joint space narrowing noted. The pelvis and sacrum are intact. Limited evaluation of the right hip is unremarkable. IMPRESSION: Comminuted, angulated left intratrochanteric hip fracture with avulsion of the lesser trochanter. Electronically Signed   By: Fidela Salisbury MD   On: 12/09/2019 00:11    Assessment: 74 y.o. AAM  1. Breakthrough seizure, history of epilelsy  2. Left hip fracture secondary to fall  3. Stage III rectal cancer, s/p total neoadjuvant chemo and radiation. 4.  Mild cognitive impairment   Plan:  -Due to his current hospitalization for breakthrough seizure and left hip fracture, his last 2 radiation treatments (yesterday and today) was canceled and Dr. Lisbeth Renshaw will forgo the last two treatment -pt has stopped oral Xeloda also  -He has  now completed total neoadjuvant chemo and radiation.  He has 6 to 12 weeks interval before he will definitive rectal cancer surgery at [facility].  He was supposed to see his surgeon Dr. Morton Stall today, and it was rescheduled for early December.  -I encouraged him to focus on treatment for his seizure and femur fracture and rehab -I will see him back in my clinic in 4-5 weeks.  He knows to call me if he has any concerns about his cancer before his next appointment.  Truitt Merle, MD 12/10/2019

## 2019-12-11 ENCOUNTER — Ambulatory Visit: Payer: 59

## 2019-12-11 ENCOUNTER — Encounter: Payer: Self-pay | Admitting: Radiation Oncology

## 2019-12-11 ENCOUNTER — Encounter (HOSPITAL_COMMUNITY)
Admission: EM | Disposition: A | Discharge status: Rehabilitation Facility | Payer: Self-pay | Source: Home / Self Care | Attending: Internal Medicine

## 2019-12-11 ENCOUNTER — Inpatient Hospital Stay (HOSPITAL_COMMUNITY): Payer: PRIVATE HEALTH INSURANCE | Admitting: Certified Registered"

## 2019-12-11 ENCOUNTER — Inpatient Hospital Stay (HOSPITAL_COMMUNITY): Payer: PRIVATE HEALTH INSURANCE

## 2019-12-11 ENCOUNTER — Encounter (HOSPITAL_COMMUNITY): Payer: Self-pay | Admitting: Family Medicine

## 2019-12-11 DIAGNOSIS — E872 Acidosis: Secondary | ICD-10-CM | POA: Diagnosis not present

## 2019-12-11 HISTORY — PX: INTRAMEDULLARY (IM) NAIL INTERTROCHANTERIC: SHX5875

## 2019-12-11 LAB — CBC
HCT: 30.5 % — ABNORMAL LOW (ref 39.0–52.0)
Hemoglobin: 10.1 g/dL — ABNORMAL LOW (ref 13.0–17.0)
MCH: 31 pg (ref 26.0–34.0)
MCHC: 33.1 g/dL (ref 30.0–36.0)
MCV: 93.6 fL (ref 80.0–100.0)
Platelets: 110 10*3/uL — ABNORMAL LOW (ref 150–400)
RBC: 3.26 MIL/uL — ABNORMAL LOW (ref 4.22–5.81)
RDW: 16.4 % — ABNORMAL HIGH (ref 11.5–15.5)
WBC: 8.4 10*3/uL (ref 4.0–10.5)
nRBC: 0 % (ref 0.0–0.2)

## 2019-12-11 LAB — TYPE AND SCREEN
ABO/RH(D): A POS
Antibody Screen: NEGATIVE

## 2019-12-11 LAB — CREATININE, SERUM
Creatinine, Ser: 0.66 mg/dL (ref 0.61–1.24)
GFR, Estimated: >60 mL/min (ref >60)

## 2019-12-11 IMAGING — RF DG C-ARM 1-60 MIN
1 series · 3 of 3 positions shown · non-contrast
Comparison: [DATE]

CLINICAL DATA: Left intertrochanteric hip fracture.

EXAM:
OPERATIVE LEFT HIP (WITH PELVIS IF PERFORMED) 3 VIEWS
TECHNIQUE: Fluoroscopic spot image(s) were submitted for interpretation
post-operatively.

[Series 1: run · 3 of 3 slices shown]
[im 1/3]
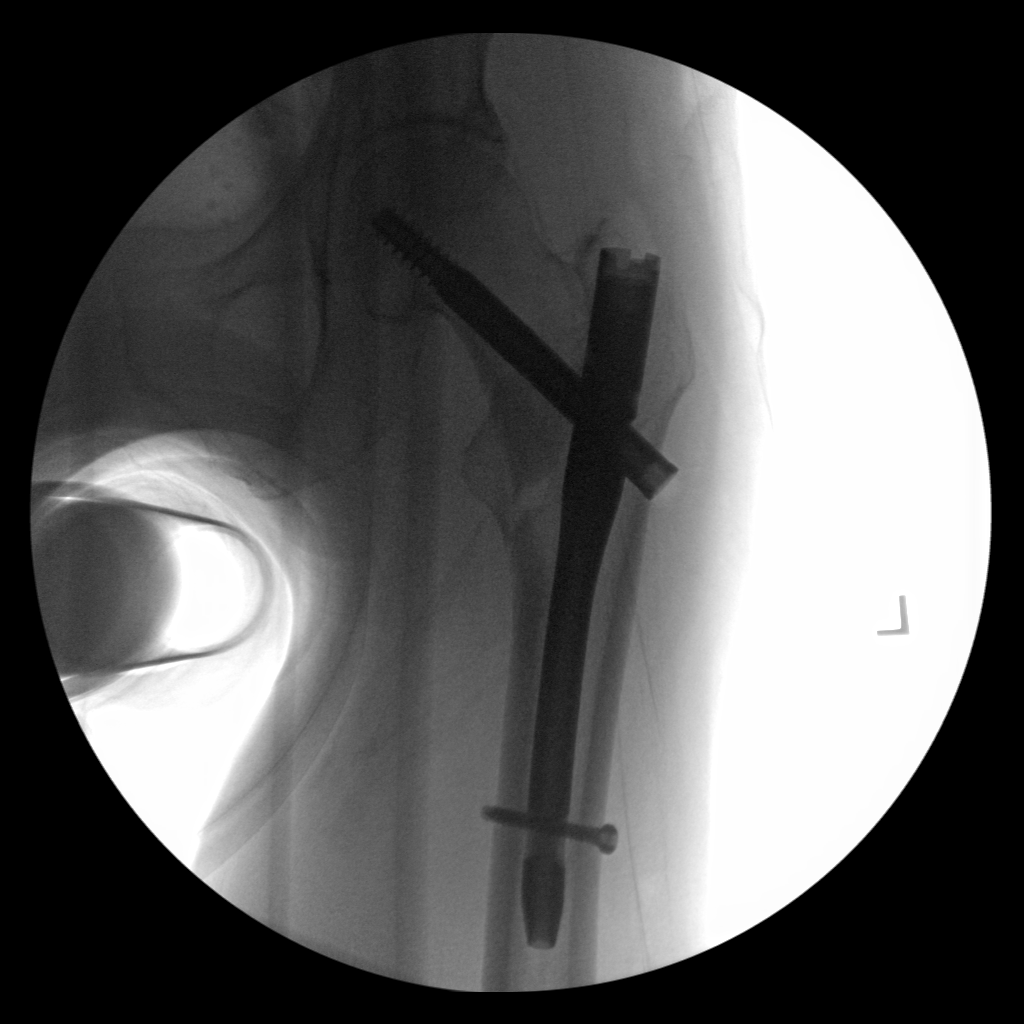
[im 2/3]
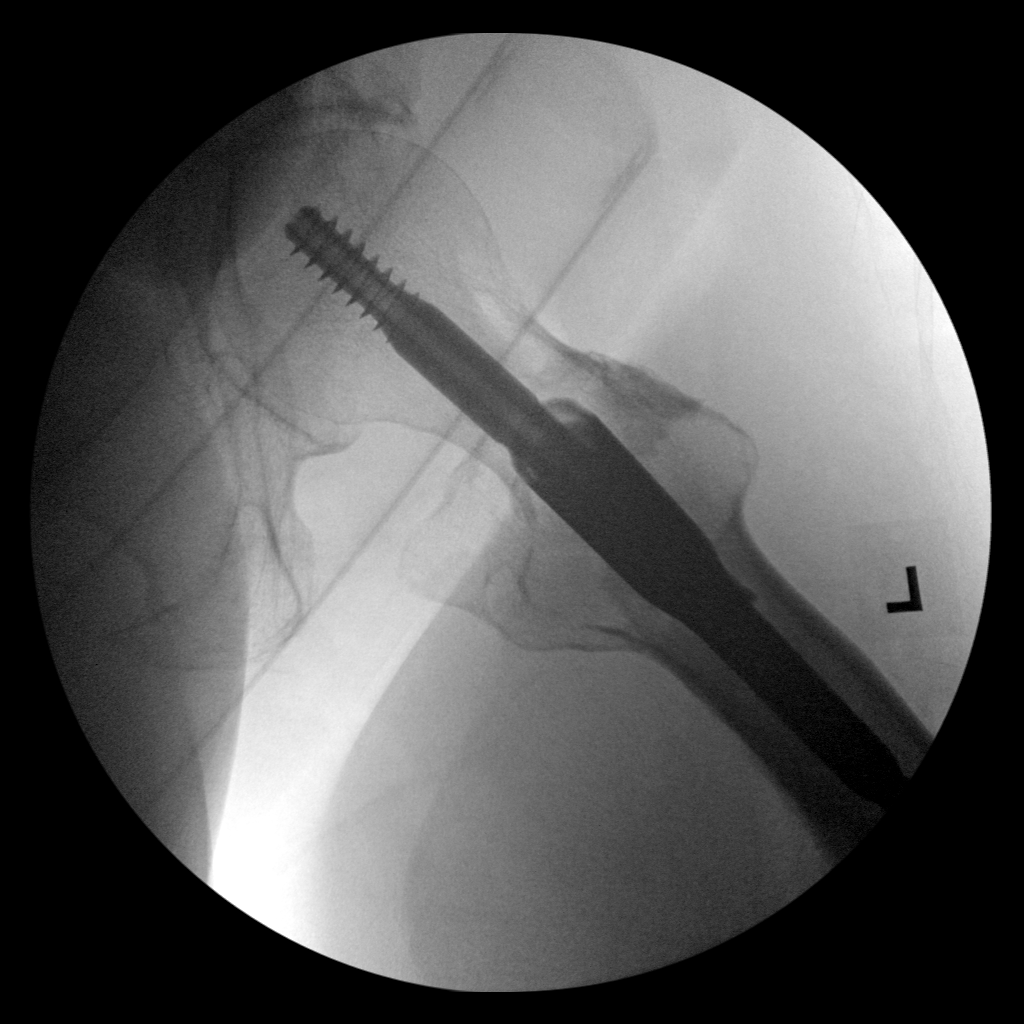
[im 3/3]
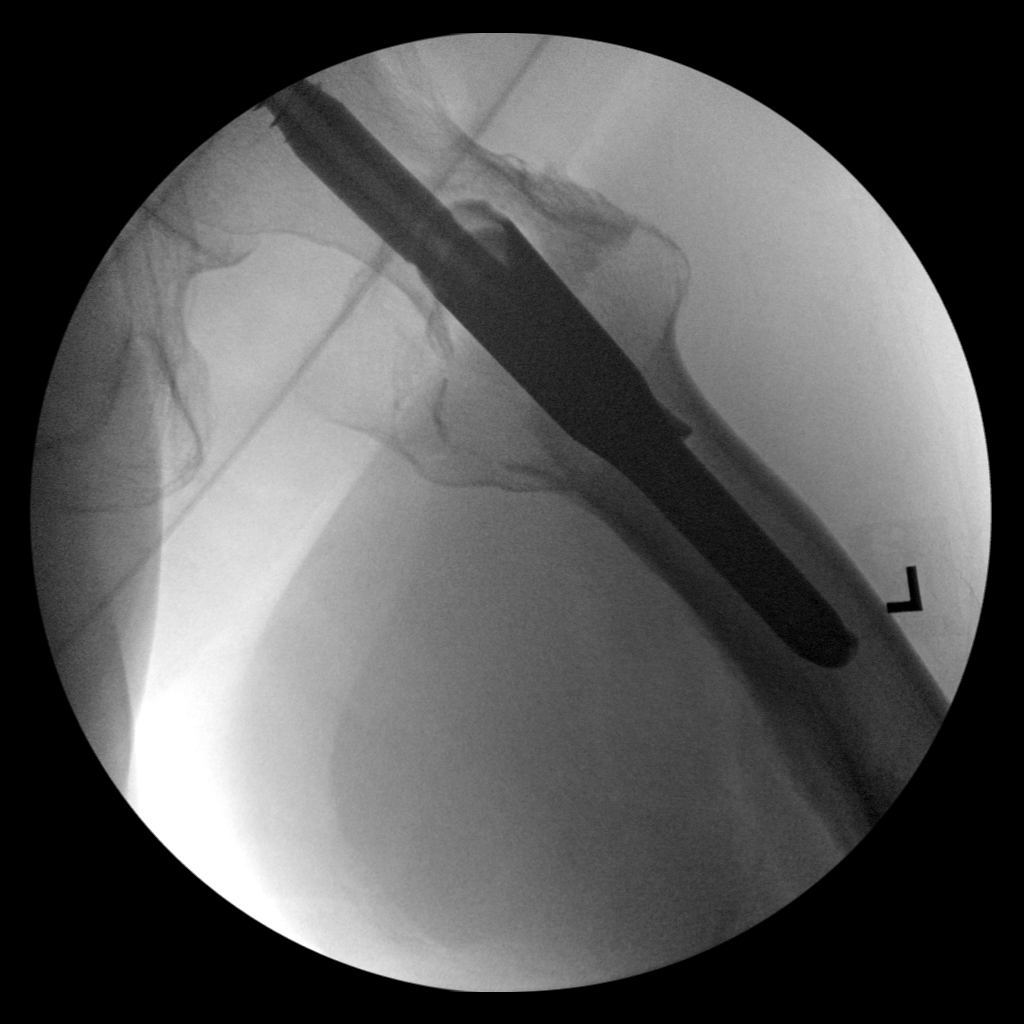

[3 of 3 positions shown; findings below may reference images not displayed]

FINDINGS: Intramedullary rod and compression screw fixation of the previously
demonstrated left femoral intertrochanteric fracture with anatomic
position and alignment.
IMPRESSION: Hardware fixation of the previously demonstrated left femoral
intertrochanteric fracture with anatomic position and alignment.

## 2019-12-11 IMAGING — DX DG PORTABLE PELVIS
1 series · 1 of 1 positions shown · non-contrast
Comparison: Earlier today

CLINICAL DATA: Status post ORIF of proximal left femur.

EXAM:
PORTABLE PELVIS 1-2 VIEWS

[pelvis]
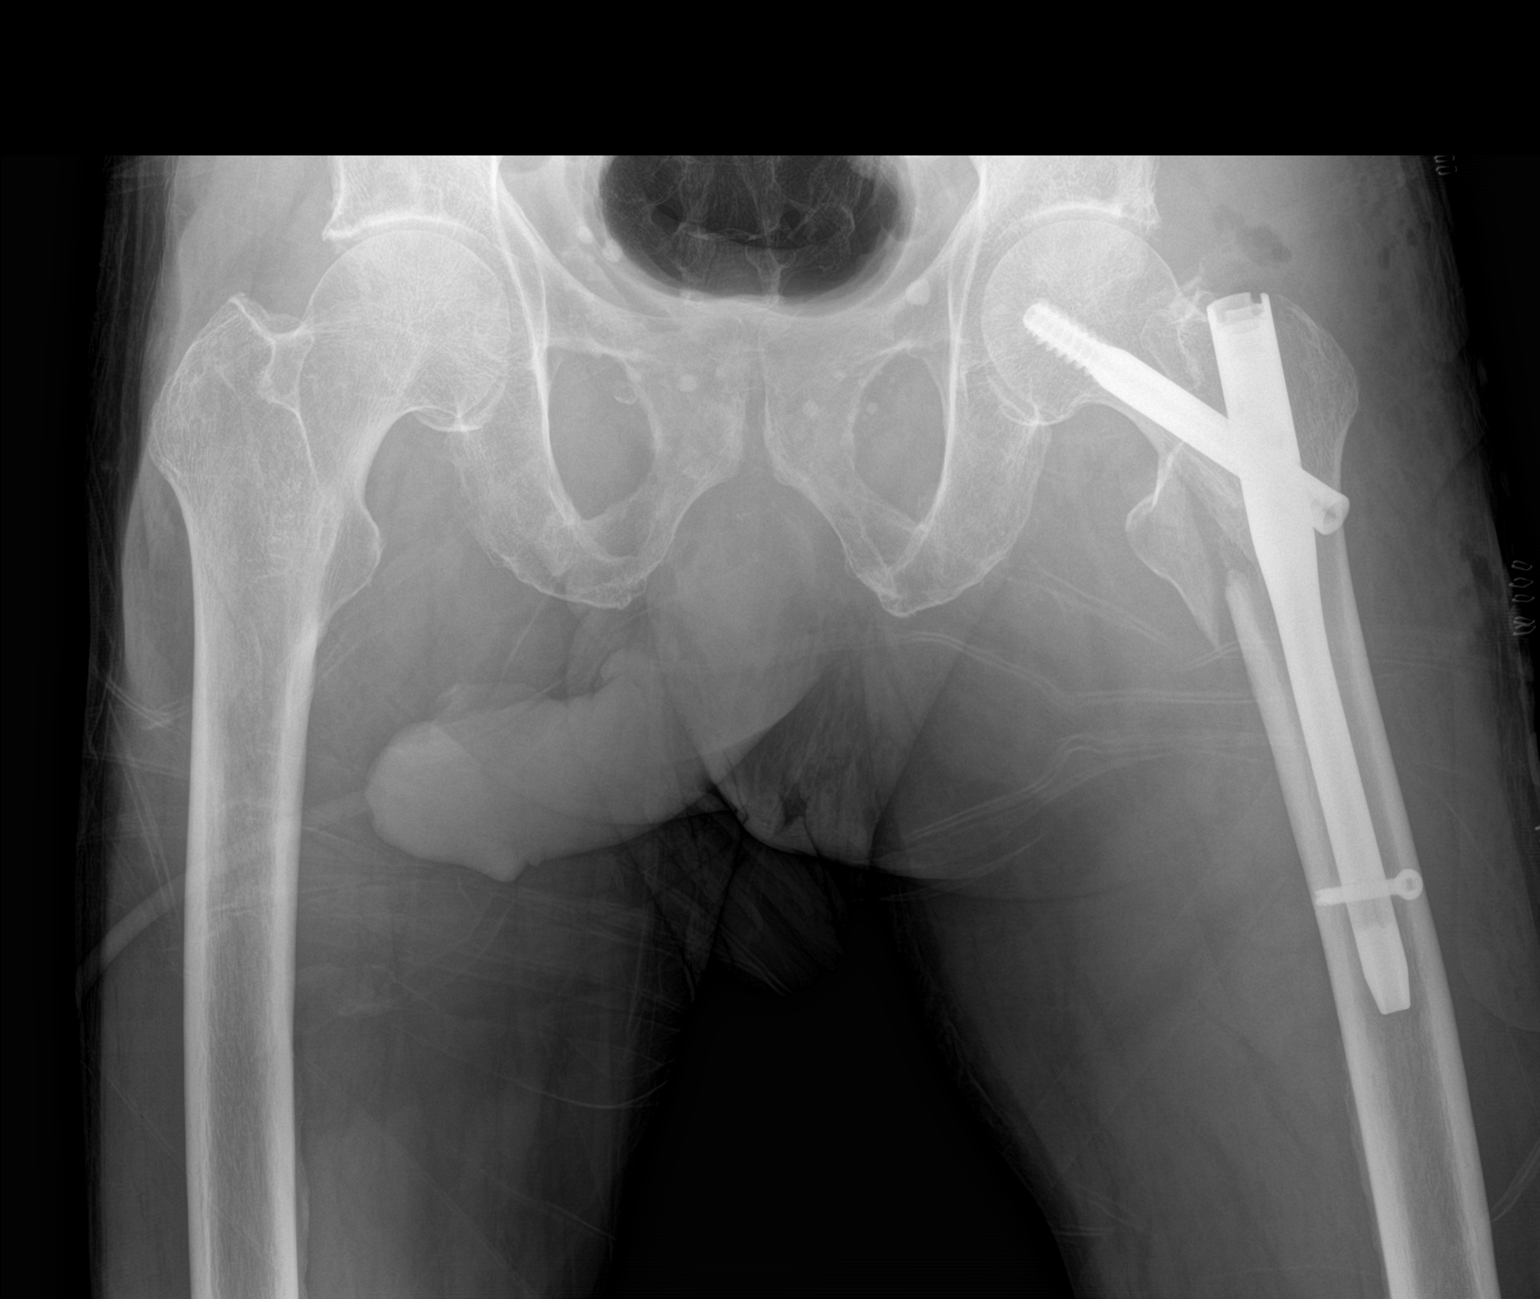

[1 of 1 positions shown; findings below may reference images not displayed]

FINDINGS: The patient is status post ORIF of comminuted fracture deformity
involving the intertrochanteric portions of the proximal left femur.
There is been placement of IM rod with compression screw. The
hardware components and fracture fragments are in anatomic
alignment.
IMPRESSION: 1. Status post ORIF of proximal left femur.

## 2019-12-11 IMAGING — RF DG HIP (WITH PELVIS) OPERATIVE*L*
1 series · 3 of 3 positions shown · non-contrast
Comparison: [DATE]

CLINICAL DATA: Left intertrochanteric hip fracture.

EXAM:
OPERATIVE LEFT HIP (WITH PELVIS IF PERFORMED) 3 VIEWS
TECHNIQUE: Fluoroscopic spot image(s) were submitted for interpretation
post-operatively.

[Series 1: run · 3 of 3 slices shown]
[im 1/3]
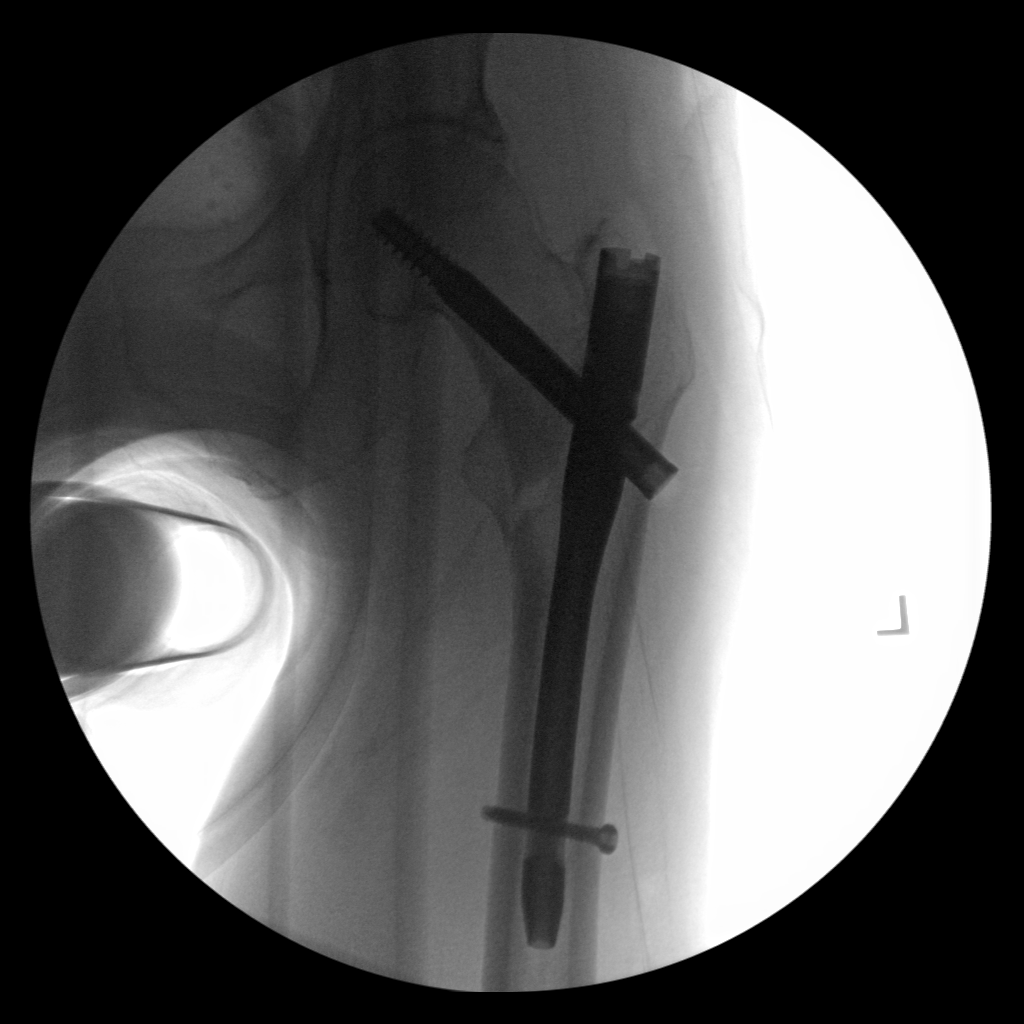
[im 2/3]
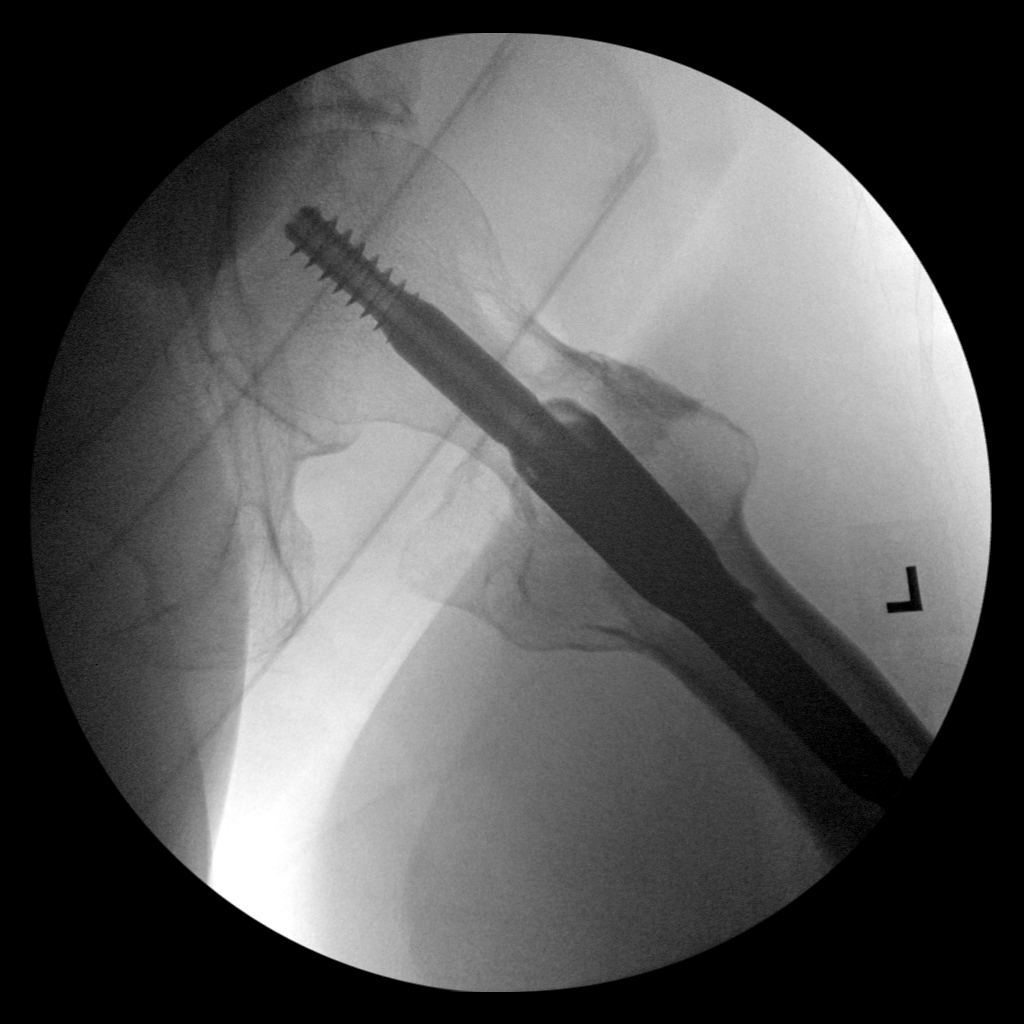
[im 3/3]
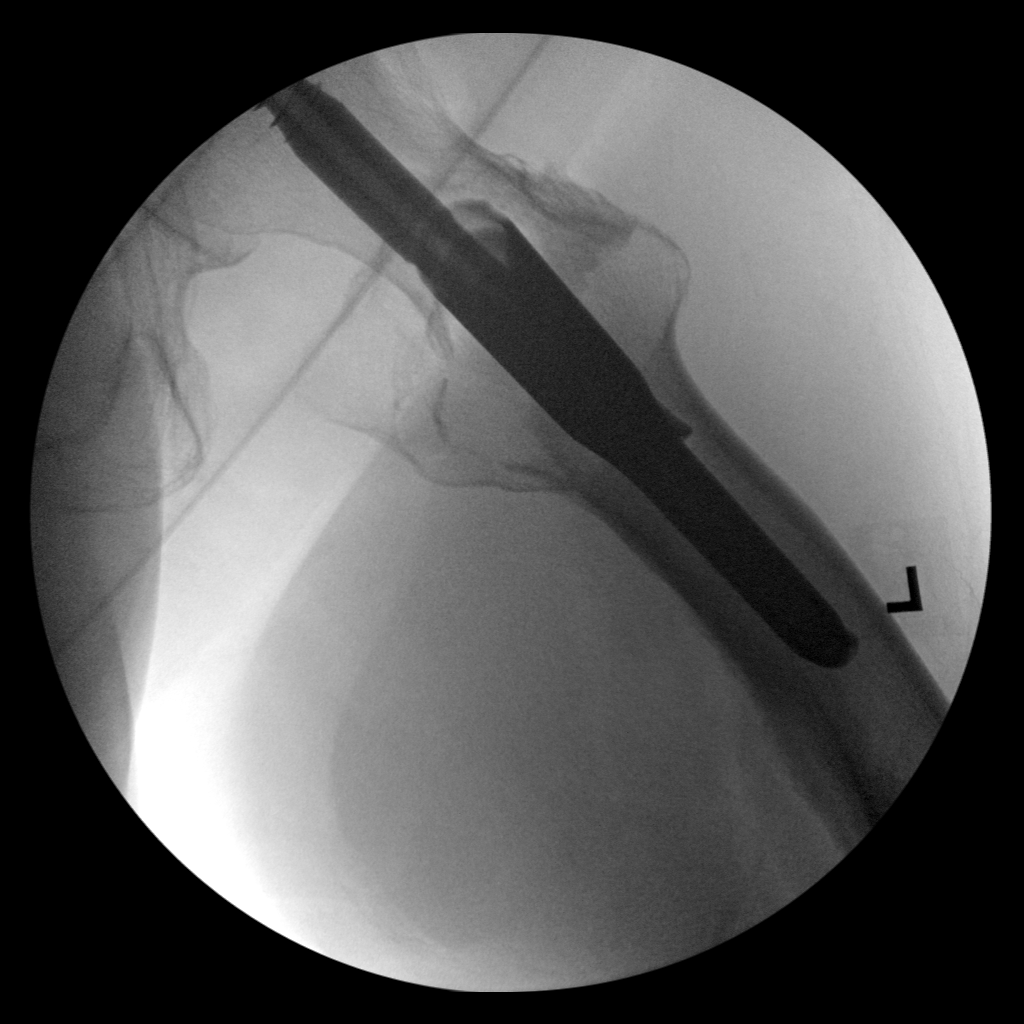

[3 of 3 positions shown; findings below may reference images not displayed]

FINDINGS: Intramedullary rod and compression screw fixation of the previously
demonstrated left femoral intertrochanteric fracture with anatomic
position and alignment.
IMPRESSION: Hardware fixation of the previously demonstrated left femoral
intertrochanteric fracture with anatomic position and alignment.

## 2019-12-11 SURGERY — FIXATION, FRACTURE, INTERTROCHANTERIC, WITH INTRAMEDULLARY ROD
Anesthesia: General | Site: Hip | Laterality: Left

## 2019-12-11 MED ORDER — ROCURONIUM BROMIDE 10 MG/ML (PF) SYRINGE
PREFILLED_SYRINGE | INTRAVENOUS | Status: DC | PRN
  Administered 2019-12-11: 70 mg via INTRAVENOUS

## 2019-12-11 MED ORDER — PROPOFOL 10 MG/ML IV BOLUS
INTRAVENOUS | Status: AC
  Filled 2019-12-11: qty 20

## 2019-12-11 MED ORDER — PHENOL 1.4 % MT LIQD: 1.0000 | OROMUCOSAL | Status: DC | PRN

## 2019-12-11 MED ORDER — HYDROMORPHONE HCL 1 MG/ML IJ SOLN
INTRAMUSCULAR | Status: AC
  Administered 2019-12-11: 0.25 mg via INTRAVENOUS
  Filled 2019-12-11: qty 1

## 2019-12-11 MED ORDER — FENTANYL CITRATE (PF) 250 MCG/5ML IJ SOLN
INTRAMUSCULAR | Status: AC
  Filled 2019-12-11: qty 5

## 2019-12-11 MED ORDER — METHOCARBAMOL 1000 MG/10ML IJ SOLN
500.0000 mg | Freq: Four times a day (QID) | INTRAVENOUS | Status: DC | PRN
  Filled 2019-12-11: qty 5

## 2019-12-11 MED ORDER — PHENYLEPHRINE 40 MCG/ML (10ML) SYRINGE FOR IV PUSH (FOR BLOOD PRESSURE SUPPORT)
PREFILLED_SYRINGE | INTRAVENOUS | Status: DC | PRN
  Administered 2019-12-11 (×2): 120 ug via INTRAVENOUS

## 2019-12-11 MED ORDER — ONDANSETRON HCL 4 MG PO TABS
4.0000 mg | ORAL_TABLET | Freq: Four times a day (QID) | ORAL | Status: DC | PRN
  Administered 2019-12-13: 4 mg via ORAL
  Filled 2019-12-11: qty 1

## 2019-12-11 MED ORDER — 0.9 % SODIUM CHLORIDE (POUR BTL) OPTIME
TOPICAL | Status: DC | PRN
  Administered 2019-12-11: 1000 mL

## 2019-12-11 MED ORDER — PROPOFOL 10 MG/ML IV BOLUS
INTRAVENOUS | Status: DC | PRN
  Administered 2019-12-11: 100 mg via INTRAVENOUS

## 2019-12-11 MED ORDER — SUGAMMADEX SODIUM 200 MG/2ML IV SOLN
INTRAVENOUS | Status: DC | PRN
  Administered 2019-12-11: 120 mg via INTRAVENOUS

## 2019-12-11 MED ORDER — MEPERIDINE HCL 25 MG/ML IJ SOLN: 6.2500 mg | INTRAMUSCULAR | Status: DC | PRN

## 2019-12-11 MED ORDER — ONDANSETRON HCL 4 MG/2ML IJ SOLN: 4.0000 mg | Freq: Four times a day (QID) | INTRAMUSCULAR | Status: DC | PRN

## 2019-12-11 MED ORDER — TRANEXAMIC ACID-NACL 1000-0.7 MG/100ML-% IV SOLN
INTRAVENOUS | Status: AC
  Filled 2019-12-11: qty 100

## 2019-12-11 MED ORDER — CHLORHEXIDINE GLUCONATE CLOTH 2 % EX PADS
6.0000 | MEDICATED_PAD | Freq: Every day | CUTANEOUS | Status: DC
  Administered 2019-12-13 – 2019-12-15 (×3): 6 via TOPICAL

## 2019-12-11 MED ORDER — METOCLOPRAMIDE HCL 10 MG PO TABS: 5.0000 mg | ORAL_TABLET | Freq: Three times a day (TID) | ORAL | Status: DC | PRN

## 2019-12-11 MED ORDER — CEFAZOLIN SODIUM-DEXTROSE 2-4 GM/100ML-% IV SOLN
INTRAVENOUS | Status: AC
  Filled 2019-12-11: qty 100

## 2019-12-11 MED ORDER — POVIDONE-IODINE 10 % EX SWAB: 2.0000 "application " | Freq: Once | CUTANEOUS | Status: DC

## 2019-12-11 MED ORDER — LIDOCAINE 2% (20 MG/ML) 5 ML SYRINGE
INTRAMUSCULAR | Status: DC | PRN
  Administered 2019-12-11: 100 mg via INTRAVENOUS

## 2019-12-11 MED ORDER — DOCUSATE SODIUM 100 MG PO CAPS
100.0000 mg | ORAL_CAPSULE | Freq: Two times a day (BID) | ORAL | Status: DC
  Administered 2019-12-11 – 2019-12-15 (×8): 100 mg via ORAL
  Filled 2019-12-11 (×8): qty 1

## 2019-12-11 MED ORDER — FENTANYL CITRATE (PF) 100 MCG/2ML IJ SOLN
INTRAMUSCULAR | Status: DC | PRN
  Administered 2019-12-11: 25 ug via INTRAVENOUS
  Administered 2019-12-11: 100 ug via INTRAVENOUS
  Administered 2019-12-11: 25 ug via INTRAVENOUS

## 2019-12-11 MED ORDER — CHLORHEXIDINE GLUCONATE 4 % EX LIQD: 60.0000 mL | Freq: Once | CUTANEOUS | Status: DC

## 2019-12-11 MED ORDER — ENOXAPARIN SODIUM 40 MG/0.4ML ~~LOC~~ SOLN
40.0000 mg | SUBCUTANEOUS | Status: DC
  Administered 2019-12-12 – 2019-12-13 (×2): 40 mg via SUBCUTANEOUS
  Filled 2019-12-11 (×2): qty 0.4

## 2019-12-11 MED ORDER — ONDANSETRON HCL 4 MG/2ML IJ SOLN
INTRAMUSCULAR | Status: AC
  Filled 2019-12-11: qty 2

## 2019-12-11 MED ORDER — DEXAMETHASONE SODIUM PHOSPHATE 10 MG/ML IJ SOLN
INTRAMUSCULAR | Status: DC | PRN
  Administered 2019-12-11: 10 mg via INTRAVENOUS

## 2019-12-11 MED ORDER — HYDROMORPHONE HCL 1 MG/ML IJ SOLN
0.2500 mg | INTRAMUSCULAR | Status: DC | PRN
  Administered 2019-12-11: 0.25 mg via INTRAVENOUS

## 2019-12-11 MED ORDER — METHOCARBAMOL 500 MG PO TABS: 500.0000 mg | ORAL_TABLET | Freq: Four times a day (QID) | ORAL | Status: DC | PRN

## 2019-12-11 MED ORDER — ONDANSETRON HCL 4 MG/2ML IJ SOLN: 4.0000 mg | Freq: Once | INTRAMUSCULAR | Status: DC | PRN

## 2019-12-11 MED ORDER — METOCLOPRAMIDE HCL 5 MG/ML IJ SOLN: 5.0000 mg | Freq: Three times a day (TID) | INTRAMUSCULAR | Status: DC | PRN

## 2019-12-11 MED ORDER — TRANEXAMIC ACID-NACL 1000-0.7 MG/100ML-% IV SOLN
1000.0000 mg | INTRAVENOUS | Status: AC
  Administered 2019-12-11: 1000 mg via INTRAVENOUS

## 2019-12-11 MED ORDER — ONDANSETRON HCL 4 MG/2ML IJ SOLN
INTRAMUSCULAR | Status: DC | PRN
  Administered 2019-12-11: 4 mg via INTRAVENOUS

## 2019-12-11 MED ORDER — CEFAZOLIN SODIUM-DEXTROSE 2-4 GM/100ML-% IV SOLN
2.0000 g | Freq: Four times a day (QID) | INTRAVENOUS | Status: AC
  Administered 2019-12-11 (×2): 2 g via INTRAVENOUS
  Filled 2019-12-11 (×2): qty 100

## 2019-12-11 MED ORDER — CEFAZOLIN SODIUM-DEXTROSE 2-4 GM/100ML-% IV SOLN
2.0000 g | INTRAVENOUS | Status: AC
  Administered 2019-12-11: 2 g via INTRAVENOUS

## 2019-12-11 MED ORDER — MENTHOL 3 MG MT LOZG: 1.0000 | LOZENGE | OROMUCOSAL | Status: DC | PRN

## 2019-12-11 SURGICAL SUPPLY — 57 items
ADH SKN CLS APL DERMABOND .7 (GAUZE/BANDAGES/DRESSINGS) ×1
ALCOHOL 70% 16 OZ (MISCELLANEOUS) ×2 IMPLANT
APL PRP STRL LF DISP 70% ISPRP (MISCELLANEOUS) ×1
BIT DRILL CANN LG 4.3MM (BIT) IMPLANT
BNDG COHESIVE 4X5 TAN STRL (GAUZE/BANDAGES/DRESSINGS) IMPLANT
CHLORAPREP W/TINT 26 (MISCELLANEOUS) ×2 IMPLANT
COVER PERINEAL POST (MISCELLANEOUS) ×2 IMPLANT
COVER SURGICAL LIGHT HANDLE (MISCELLANEOUS) ×2 IMPLANT
COVER WAND RF STERILE (DRAPES) ×2 IMPLANT
DERMABOND ADVANCED (GAUZE/BANDAGES/DRESSINGS) ×1
DERMABOND ADVANCED .7 DNX12 (GAUZE/BANDAGES/DRESSINGS) ×2 IMPLANT
DRAPE C-ARM 42X72 X-RAY (DRAPES) ×2 IMPLANT
DRAPE C-ARMOR (DRAPES) ×2 IMPLANT
DRAPE HALF SHEET 40X57 (DRAPES) ×2 IMPLANT
DRAPE IMP U-DRAPE 54X76 (DRAPES) ×4 IMPLANT
DRAPE STERI IOBAN 125X83 (DRAPES) ×2 IMPLANT
DRAPE U-SHAPE 47X51 STRL (DRAPES) ×4 IMPLANT
DRILL BIT CANN LG 4.3MM (BIT) ×2
DRSG AQUACEL AG ADV 3.5X 4 (GAUZE/BANDAGES/DRESSINGS) ×1 IMPLANT
DRSG AQUACEL AG ADV 3.5X 6 (GAUZE/BANDAGES/DRESSINGS) ×1 IMPLANT
DRSG MEPILEX BORDER 4X4 (GAUZE/BANDAGES/DRESSINGS) ×4 IMPLANT
DRSG PAD ABDOMINAL 8X10 ST (GAUZE/BANDAGES/DRESSINGS) IMPLANT
ELECT REM PT RETURN 9FT ADLT (ELECTROSURGICAL) ×2
ELECTRODE REM PT RTRN 9FT ADLT (ELECTROSURGICAL) ×1 IMPLANT
FACESHIELD WRAPAROUND (MASK) ×2 IMPLANT
FACESHIELD WRAPAROUND OR TEAM (MASK) ×1 IMPLANT
GLOVE BIO SURGEON STRL SZ8.5 (GLOVE) ×4 IMPLANT
GLOVE BIOGEL M 7.0 STRL (GLOVE) ×2 IMPLANT
GLOVE BIOGEL PI IND STRL 7.5 (GLOVE) ×1 IMPLANT
GLOVE BIOGEL PI IND STRL 8.5 (GLOVE) ×1 IMPLANT
GLOVE BIOGEL PI INDICATOR 7.5 (GLOVE) ×1
GLOVE BIOGEL PI INDICATOR 8.5 (GLOVE) ×1
GOWN STRL REUS W/ TWL LRG LVL3 (GOWN DISPOSABLE) ×2 IMPLANT
GOWN STRL REUS W/ TWL XL LVL3 (GOWN DISPOSABLE) ×1 IMPLANT
GOWN STRL REUS W/TWL 2XL LVL3 (GOWN DISPOSABLE) ×2 IMPLANT
GOWN STRL REUS W/TWL LRG LVL3 (GOWN DISPOSABLE) ×4
GOWN STRL REUS W/TWL XL LVL3 (GOWN DISPOSABLE) ×2
GUIDEPIN 3.2X17.5 THRD DISP (PIN) ×1 IMPLANT
HFN 125 DEG 11MM X 180MM (Orthopedic Implant) ×1 IMPLANT
HIP FRAC NAIL LAG SCR 10.5X100 (Orthopedic Implant) ×1 IMPLANT
KIT TURNOVER KIT B (KITS) ×2 IMPLANT
MANIFOLD NEPTUNE II (INSTRUMENTS) ×2 IMPLANT
MARKER SKIN DUAL TIP RULER LAB (MISCELLANEOUS) ×2 IMPLANT
NS IRRIG 1000ML POUR BTL (IV SOLUTION) ×2 IMPLANT
PACK GENERAL/GYN (CUSTOM PROCEDURE TRAY) ×2 IMPLANT
PACK UNIVERSAL I (CUSTOM PROCEDURE TRAY) ×2 IMPLANT
PAD ARMBOARD 7.5X6 YLW CONV (MISCELLANEOUS) ×4 IMPLANT
PADDING CAST ABS 4INX4YD NS (CAST SUPPLIES)
PADDING CAST ABS COTTON 4X4 ST (CAST SUPPLIES) IMPLANT
SCREW BONE CORTICAL 5.0X36 (Screw) ×1 IMPLANT
SCREW CANN THRD AFF 10.5X100 (Orthopedic Implant) IMPLANT
SUT MNCRL AB 3-0 PS2 27 (SUTURE) ×2 IMPLANT
SUT MON AB 2-0 CT1 27 (SUTURE) ×2 IMPLANT
SUT VIC AB 1 CT1 27 (SUTURE) ×2
SUT VIC AB 1 CT1 27XBRD ANBCTR (SUTURE) ×1 IMPLANT
TOWEL GREEN STERILE (TOWEL DISPOSABLE) ×2 IMPLANT
TOWEL GREEN STERILE FF (TOWEL DISPOSABLE) ×2 IMPLANT

## 2019-12-11 NOTE — Transfer of Care (Signed)
Immediate Anesthesia Transfer of Care Note  Patient: Carlos [name]  Procedure(s) Performed: INTRAMEDULLARY (IM) NAIL INTERTROCHANTRIC (Left Hip)  Patient Location: PACU  Anesthesia Type:General  Level of Consciousness: drowsy  Airway & Oxygen Therapy: Patient Spontanous Breathing and Patient connected to nasal cannula oxygen  Post-op Assessment: Report given to RN and Post -op Vital signs reviewed and stable  Post vital signs: Reviewed and stable  Last Vitals:  Vitals Value Taken Time  BP 143/82 12/11/19 0906  Temp    Pulse 77 12/11/19 0906  Resp 16 12/11/19 0906  SpO2 100 % 12/11/19 0906  Vitals shown include unvalidated device data.  Last Pain:  Vitals:   12/11/19 0400  TempSrc: Oral  PainSc:       Patients Stated Pain Goal: 2 (02/63/78 5885)  Complications: No complications documented.

## 2019-12-11 NOTE — Interval H&P Note (Signed)
History and Physical Interval Note:  12/11/2019 7:20 AM  Carlos [name]  has presented today for surgery, with the diagnosis of Left hip fx.  The various methods of treatment have been discussed with the patient and family. After consideration of risks, benefits and other options for treatment, the patient has consented to  Procedure(s): INTRAMEDULLARY (IM) NAIL INTERTROCHANTRIC (Left) as a surgical intervention.  The patient's history has been reviewed, patient examined, no change in status, stable for surgery.  I have reviewed the patient's chart and labs.  Questions were answered to the patient's satisfaction.    The risks, benefits, and alternatives were discussed with the patient. There are risks associated with the surgery including, but not limited to, problems with anesthesia (death), infection, differences in leg length/angulation/rotation, fracture of bones, loosening or failure of implants, malunion, nonunion, hematoma (blood accumulation) which may require surgical drainage, blood clots, pulmonary embolism, nerve injury (foot drop), and blood vessel injury. The patient understands these risks and elects to proceed.    Hilton Cork Breklyn Fabrizio

## 2019-12-11 NOTE — Anesthesia Preprocedure Evaluation (Signed)
Anesthesia Evaluation  Patient identified by MRN, date of birth, ID band Patient awake    Reviewed: Allergy & Precautions, NPO status , Patient's Chart, lab work & pertinent test results  Airway Mallampati: I  TM Distance: >3 FB Neck ROM: Full    Dental   Pulmonary    Pulmonary exam normal        Cardiovascular Normal cardiovascular exam     Neuro/Psych Seizures -,  Anxiety    GI/Hepatic   Endo/Other    Renal/GU      Musculoskeletal   Abdominal   Peds  Hematology   Anesthesia Other Findings   Reproductive/Obstetrics                             Anesthesia Physical Anesthesia Plan  ASA: II  Anesthesia Plan: General   Post-op Pain Management:    Induction: Intravenous  PONV Risk Score and Plan: 2  Airway Management Planned: Oral ETT  Additional Equipment:   Intra-op Plan:   Post-operative Plan: Extubation in OR  Informed Consent: I have reviewed the patients History and Physical, chart, labs and discussed the procedure including the risks, benefits and alternatives for the proposed anesthesia with the patient or authorized representative who has indicated his/her understanding and acceptance.       Plan Discussed with: CRNA and Surgeon  Anesthesia Plan Comments:         Anesthesia Quick Evaluation

## 2019-12-11 NOTE — Progress Notes (Signed)
   12/11/19 1117  Vitals  Temp 98 F (36.7 C)  Temp Source Oral  BP 136/78  MAP (mmHg) 95  BP Location Right Arm  BP Method Automatic  Patient Position (if appropriate) Lying  Pulse Rate 90  Pulse Rate Source Dinamap  Resp 16  MEWS COLOR  MEWS Score Color Green  Oxygen Therapy  SpO2 100 %  O2 Device Room Air  MEWS Score  MEWS Temp 0  MEWS Systolic 0  MEWS Pulse 0  MEWS RR 0  MEWS LOC 0  MEWS Score 0  Patient returned to the unit today following hip surgery. A&O x 1 to 2, disoriented to time and situation, occasional disorientation to place. Pt in bed resting, denies pain or discomfort, family at bedside.

## 2019-12-11 NOTE — Plan of Care (Signed)
Patient is currently resting in bed. AO x2-3. In a low bed with fall matts on the floor for safety. C/o pain with movement given norco PRN. Leg elevated on pillow. CHG bath done this AM. Patient NPO for OR today. MIVF infusing. Male pure wick in place.   Patients Sister called to informed them patient will be leaving the floor for surgery shortly. RN unaware of when patient will return. Will communicate to oncoming RN to keep family update on patients return and have surgeon update when done with surgery.   Problem: Education: Goal: Knowledge of General Education information will improve Description: Including pain rating scale, medication(s)/side effects and non-pharmacologic comfort measures Outcome: Progressing   Problem: Health Behavior/Discharge Planning: Goal: Ability to manage health-related needs will improve Outcome: Progressing   Problem: Clinical Measurements: Goal: Ability to maintain clinical measurements within normal limits will improve Outcome: Progressing Goal: Will remain free from infection Outcome: Progressing Goal: Diagnostic test results will improve Outcome: Progressing Goal: Respiratory complications will improve Outcome: Progressing Goal: Cardiovascular complication will be avoided Outcome: Progressing   Problem: Activity: Goal: Risk for activity intolerance will decrease Outcome: Progressing   Problem: Nutrition: Goal: Adequate nutrition will be maintained Outcome: Progressing   Problem: Coping: Goal: Level of anxiety will decrease Outcome: Progressing   Problem: Elimination: Goal: Will not experience complications related to bowel motility Outcome: Progressing Goal: Will not experience complications related to urinary retention Outcome: Progressing   Problem: Pain Managment: Goal: General experience of comfort will improve Outcome: Progressing   Problem: Safety: Goal: Ability to remain free from injury will improve Outcome: Progressing    Problem: Skin Integrity: Goal: Risk for impaired skin integrity will decrease Outcome: Progressing

## 2019-12-11 NOTE — Op Note (Signed)
OPERATIVE REPORT  SURGEON: Rod Can, MD   ASSISTANT: Cherlynn June, PA-C.  PREOPERATIVE DIAGNOSIS: Left intertrochanteric femur fracture.   POSTOPERATIVE DIAGNOSIS: Left intertrochanteric femur fracture.   PROCEDURE: Intramedullary fixation, Left femur.   IMPLANTS: Biomet Affixus Hip Fracture Nail, 11 by 180 mm, 125 degrees. 10.5 x 100 mm Hip Fracture Nail Lag Screw. 5 x 36 mm distal interlocking screw 1.  ANESTHESIA:  General  ESTIMATED BLOOD LOSS: 50 mL.    ANTIBIOTICS: 2 g Ancef.  DRAINS: None.  COMPLICATIONS: None.   CONDITION: PACU - hemodynamically stable.   BRIEF CLINICAL NOTE: Carlos [name] is a 74 y.o. male who presented with an intertrochanteric femur fracture. The patient was admitted to the hospitalist service and underwent perioperative risk stratification and medical optimization. The risks, benefits, and alternatives to the procedure were explained, and the patient elected to proceed.  PROCEDURE IN DETAIL: Surgical site was marked by myself. The patient was taken to the operating room and anesthesia was induced on the bed. A foley catheter was placed. The patient was then transferred to the [facility] table and the nonoperative lower extremity was scissored underneath the operative side. The fracture was reduced with traction, internal rotation, and adduction. The hip was prepped and draped in the normal sterile surgical fashion. Timeout was called verifying side and site of surgery. Preop antibiotics were given with 60 minutes of beginning the procedure.  Fluoroscopy was used to define the patient's anatomy. A 4 cm incision was made just proximal to the tip of the greater trochanter. The awl was used to obtain the standard starting point for a trochanteric entry nail under fluoroscopic control. The guidepin was placed. The entry reamer was used to open the proximal femur.  On the back table, the nail was assembled onto the jig. The nail was placed into the femur  without any difficulty. Through a separate stab incision, the cannula was placed down to the bone in preparation for the cephalomedullary device. A guidepin was placed into the femoral head using AP and lateral fluoroscopy views. The pin was measured, and then reaming was performed to the appropriate depth. The lag screw was inserted to the appropriate depth. The fracture was compressed through the jig. The setscrew was tightened and then loosened one quarter turn. A separate stab incision was created, and the distal interlocking screw was placed using standard AO technique. The jig was removed. Final AP and lateral fluoroscopy views were obtained to confirm fracture reduction and hardware placement. Tip apex distance was appropriate. There was no chondral penetration.  The wounds were copiously irrigated with saline. The wound was closed in layers with #1 Vicryl for the fascia, 2-0 Monocryl for the deep dermal layer, and skin staples. Glue was applied to the skin. Once the glue was fully hardened, sterile dressing was applied. The patient was then awakened from anesthesia and taken to the PACU in stable condition. Sponge needle and instrument counts were correct at the end of the case 2. There were no known complications.  We will readmit the patient to the hospitalist. Weightbearing status will be weightbearing as tolerated with a walker. We will begin Lovenox for DVT prophylaxis. The patient will work with physical therapy and undergo disposition planning.  Please note that a surgical assistant was a medical necessity for this procedure to perform it in a safe and expeditious manner. Assistant was necessary to provide appropriate retraction of vital neurovascular structures, to prevent femoral fracture, and to allow for anatomic placement of the  prosthesis.

## 2019-12-11 NOTE — Plan of Care (Signed)
Pt is unable to progress towards goals at this time due to ongoing confusion due to altered mental status and hx of mild neurocognitive disorder.    Problem: Education: Goal: Knowledge of General Education information will improve Description: Including pain rating scale, medication(s)/side effects and non-pharmacologic comfort measures Outcome: Not Progressing   Problem: Health Behavior/Discharge Planning: Goal: Ability to manage health-related needs will improve Outcome: Not Progressing   Problem: Clinical Measurements: Goal: Ability to maintain clinical measurements within normal limits will improve Outcome: Not Progressing Goal: Will remain free from infection Outcome: Not Progressing Goal: Diagnostic test results will improve Outcome: Not Progressing Goal: Respiratory complications will improve Outcome: Not Progressing Goal: Cardiovascular complication will be avoided Outcome: Not Progressing   Problem: Activity: Goal: Risk for activity intolerance will decrease Outcome: Not Progressing   Problem: Nutrition: Goal: Adequate nutrition will be maintained Outcome: Not Progressing   Problem: Coping: Goal: Level of anxiety will decrease Outcome: Not Progressing   Problem: Elimination: Goal: Will not experience complications related to bowel motility Outcome: Not Progressing Goal: Will not experience complications related to urinary retention Outcome: Not Progressing   Problem: Pain Managment: Goal: General experience of comfort will improve Outcome: Not Progressing   Problem: Safety: Goal: Ability to remain free from injury will improve Outcome: Not Progressing   Problem: Skin Integrity: Goal: Risk for impaired skin integrity will decrease Outcome: Not Progressing

## 2019-12-11 NOTE — Progress Notes (Signed)
PROGRESS NOTE  Carlos [name]  DOB: 10-Dec-1945  PCP: [name], MD HQI:[id number]  DOA: 12/08/2019  LOS: 1 day   Chief Complaint  Patient presents with  . Seizures   Brief narrative: Patient is a 74 y.o. 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 Patient presented to the [facility] ED on 11/2 via EMS for a breakthrough seizure. Patient had radiation treatment on the day of admission. Family stated he took a nap after the treatment and was difficult to wake up. They also noticed blood in his mouth. EMS brought him to ED.  In the ED, patient was hemodynamically stable. While awaiting his complete workup, patient was found to have generalized tonic-clonic seizure activity which lasted for about a minute. Following the seizure the patient had several minutes of persistent clenching of the right hand and arm and did not follow any commands. He was treated with 2 mg of Ativan IM. He was also loaded with Keppra. He was observed in for several hours after his seizure he continued to be persistently somnolent and nonverbal. He then slowly began to awaken to verbal commands but was somnolent.  CT scan of head was unremarkable. He was loaded with Keppra 2 g and scheduled Keppra was continued at his home dosing regimen of 1 g BID PO.   While having seizure in the ED, patient fell out of his bed and landed on his left hip.  Left hip x-ray showed comminuted, angulated left intratrochanteric hip fracture with avulsion of the lesser trochanter.  Neurology and orthopedic consultation were obtained.  Patient was admitted to hospitalist service at [facility].  Subjective: Patient was seen and examined this afternoon. Underwent intramedullary fixation of left femur fracture today.  Pain controlled.  Assessment/Plan: Breakthrough seizures History of epilepsy -Unclear trigger of  breakthrough seizures. -EEG showed bitemporal spikes consistent with patient known history of seizures.  No seizures were seen throughout the recording. -Postictal status is resolved.  Neurology consult appreciated. -CT scan of head and MRI brain did not show any evidence of intracranial abnormality. -Neurology has increased Keppra to 1250 mg twice daily. -Outpatient neurology follow-up. -Seizure precautions at discharge.  Left Hip Fracture -While having seizure in the ED, patient fell off his bed on his left side.   -X-ray of left hip showed comminuted, angulated left intratrochanteric hip fracture with avulsion of the lesser trochanter.  -Orthopedic consult appreciated.  -Underwent intramedullary fixation of left femur fracture today.  Pain controlled. -P.o. Norco as needed for moderate pain IV Dilaudid as needed for severe pain  Hypokalemia -Improved with replacement.  Recheck Recent Labs  Lab 12/07/19 1005 12/08/19 1708 12/08/19 1753 12/09/19 0408  K 4.2 3.0*  --  4.0  MG  --   --  2.6* 2.2   Hyperammonemia -Initially 182, and then 24 -Unclear significance of the first elevated value, suspect an error.. Recent Labs  Lab 12/08/19 1708 12/08/19 2014  AMMONIA 182* 24   Rectal Adenocarcinoma -Continue home antineoplastic agent with capecitabine 1500 g p.o. twice daily and continue with lidocaine suppository 50 mg rectally every 8 hours as needed for pain -Continue to monitor -Oncology follow-up appreciated.  Mobility: PT eval after procedure Code Status:   Code Status: Full Code  Nutritional status: Body mass index is 21.3 kg/m.     Diet Order            Diet regular  Room service appropriate? Yes; Fluid consistency: Thin  Diet effective now                 DVT prophylaxis: enoxaparin (LOVENOX) injection 40 mg Start: 12/12/19 0800 SCDs Start: 12/11/19 1347   Antimicrobials:  None Fluid: None Consultants: Orthopedics, neurology Family Communication:  Not  at bedside  Status is: Observation  The patient will require care spanning > 2 midnights and should be moved to inpatient because: Inpatient level of care appropriate due to severity of illness  Dispo: The patient is from: Home              Anticipated d/c is to: Pending PT eval              Anticipated d/c date is: > 3 days              Patient currently is not medically stable to d/c.       Infusions:  .  ceFAZolin (ANCEF) IV    . lactated ringers 75 mL/hr at 12/11/19 1112  . methocarbamol (ROBAXIN) IV      Scheduled Meds: . docusate sodium  100 mg Oral BID  . [START ON 12/12/2019] enoxaparin (LOVENOX) injection  40 mg Subcutaneous Q24H  . levETIRAcetam  1,250 mg Oral BID    Antimicrobials: Anti-infectives (From admission, onward)   Start     Dose/Rate Route Frequency Ordered Stop   12/11/19 1445  ceFAZolin (ANCEF) IVPB 2g/100 mL premix        2 g 200 mL/hr over 30 Minutes Intravenous Every 6 hours 12/11/19 1346 12/12/19 0244   12/11/19 0730  ceFAZolin (ANCEF) IVPB 2g/100 mL premix        2 g 200 mL/hr over 30 Minutes Intravenous On call to O.R. 12/11/19 0719 12/11/19 0737   12/11/19 0712  ceFAZolin (ANCEF) 2-4 GM/100ML-% IVPB       Note to Pharmacy: Imagene Riches   : cabinet override      12/11/19 0712 12/11/19 0750      PRN meds: HYDROcodone-acetaminophen, HYDROmorphone (DILAUDID) injection, Lidocaine (Anorectal), menthol-cetylpyridinium **OR** phenol, methocarbamol **OR** methocarbamol (ROBAXIN) IV, metoCLOPramide **OR** metoCLOPramide (REGLAN) injection, ondansetron **OR** ondansetron (ZOFRAN) IV, zolpidem   Objective: Vitals:   12/11/19 1040 12/11/19 1117  BP: (!) 154/76 136/78  Pulse: 89 90  Resp: 18 16  Temp:  98 F (36.7 C)  SpO2: 98% 100%    Intake/Output Summary (Last 24 hours) at 12/11/2019 1524 Last data filed at 12/11/2019 1018 Gross per 24 hour  Intake 227.42 ml  Output 850 ml  Net -622.58 ml   Filed Weights   12/09/19 2200 12/10/19 0312  12/11/19 0400  Weight: 63.9 kg 63.9 kg 61.7 kg   Weight change: -2.211 kg Body mass index is 21.3 kg/m.   Physical Exam: General exam: Appears calm and comfortable. Not in physical distress Skin: No rashes, lesions or ulcers. HEENT: Atraumatic, normocephalic, supple neck, no obvious bleeding Lungs: Clear to auscultation bilaterally CVS: Regular rate and rhythm, no murmur GI/Abd soft, nontender, nondistended, bowel sound present CNS: Alert, awake, slow to respond but oriented x3. Psychiatry: Mood appropriate Extremities: No pedal edema, no calf tenderness Data Review: I have personally reviewed the laboratory data and studies available.  Recent Labs  Lab 12/07/19 1005 12/08/19 1708 12/09/19 0408 12/11/19 1424  WBC 3.7* 5.8 8.1 8.4  NEUTROABS 2.5 4.2 7.0  --   HGB 12.2* 13.3 10.8* 10.1*  HCT 36.0* 43.0 32.9* 30.5*  MCV 91.4 99.8 94.8 93.6  PLT 181 229 161 PENDING   Recent Labs  Lab 12/07/19 1005 12/08/19 1708 12/08/19 1753 12/09/19 0408 12/11/19 1424  NA 139 145  --  138  --   K 4.2 3.0*  --  4.0  --   CL 105 102  --  108  --   CO2 28 17*  --  23  --   GLUCOSE 105* 136*  --  135*  --   BUN 9 13  --  14  --   CREATININE 0.76 0.92  --  0.70 0.66  CALCIUM 9.6 10.0  --  8.5*  --   MG  --   --  2.6* 2.2  --     F/u labs ordered.  Signed, Terrilee Croak, MD [facility] 12/11/2019

## 2019-12-11 NOTE — Anesthesia Procedure Notes (Signed)
Procedure Name: Intubation Date/Time: 12/11/2019 7:35 AM Performed by: Imagene Riches, CRNA Pre-anesthesia Checklist: Patient identified, Emergency Drugs available, Suction available and Patient being monitored Patient Re-evaluated:Patient Re-evaluated prior to induction Oxygen Delivery Method: Circle System Utilized Preoxygenation: Pre-oxygenation with 100% oxygen Induction Type: IV induction Ventilation: Mask ventilation without difficulty Laryngoscope Size: Miller and 2 Grade View: Grade I Tube type: Oral Tube size: 7.5 mm Number of attempts: 1 Airway Equipment and Method: Stylet and Oral airway Placement Confirmation: ETT inserted through vocal cords under direct vision,  positive ETCO2 and breath sounds checked- equal and bilateral Secured at: 22 cm Tube secured with: Tape Dental Injury: Teeth and Oropharynx as per pre-operative assessment

## 2019-12-11 NOTE — Discharge Instructions (Signed)
 Dr. Ashlynn Gunnels Adult Hip & Knee Specialist [facility] [address] [place], [place] [address] [phone]   POSTOPERATIVE DIRECTIONS    Hip Rehabilitation, Guidelines Following Surgery   WEIGHT BEARING Weight bearing as tolerated with assist device (walker, cane, etc) as directed, use it as long as suggested by your surgeon or therapist, typically at least 4-6 weeks.   HOME CARE INSTRUCTIONS  Remove items at home which could result in a fall. This includes throw rugs or furniture in walking pathways.  Continue medications as instructed at time of discharge.  You may have some home medications which will be placed on hold until you complete the course of blood thinner medication.  4 days after discharge, you may start showering. No tub baths or soaking your incisions. Do not put on socks or shoes without following the instructions of your caregivers.   Sit on chairs with arms. Use the chair arms to help push yourself up when arising.  Arrange for the use of a toilet seat elevator so you are not sitting low.   Walk with walker as instructed.  You may resume a sexual relationship in one month or when given the OK by your caregiver.  Use walker as long as suggested by your caregivers.  Avoid periods of inactivity such as sitting longer than an hour when not asleep. This helps prevent blood clots.  You may return to work once you are cleared by your surgeon.  Do not drive a car for 6 weeks or until released by your surgeon.  Do not drive while taking narcotics.  Wear elastic stockings for two weeks following surgery during the day but you may remove then at night.  Make sure you keep all of your appointments after your operation with all of your doctors and caregivers. You should call the office at the above phone number and make an appointment for approximately two weeks after the date of your surgery. Please pick up a stool softener and laxative  for home use as long as you are requiring pain medications.  ICE to the affected hip every three hours for 30 minutes at a time and then as needed for pain and swelling. Continue to use ice on the hip for pain and swelling from surgery. You may notice swelling that will progress down to the foot and ankle.  This is normal after surgery.  Elevate the leg when you are not up walking on it.   It is important for you to complete the blood thinner medication as prescribed by your doctor.  Continue to use the breathing machine which will help keep your temperature down.  It is common for your temperature to cycle up and down following surgery, especially at night when you are not up moving around and exerting yourself.  The breathing machine keeps your lungs expanded and your temperature down.  RANGE OF MOTION AND STRENGTHENING EXERCISES  These exercises are designed to help you keep full movement of your hip joint. Follow your caregiver's or physical therapist's instructions. Perform all exercises about fifteen times, three times per day or as directed. Exercise both hips, even if you have had only one joint replacement. These exercises can be done on a training (exercise) mat, on the floor, on a table or on a bed. Use whatever works the best and is most comfortable for you. Use music or television while you are exercising so that the exercises are a pleasant break in your day. This   will make your life better with the exercises acting as a break in routine you can look forward to.  Lying on your back, slowly slide your foot toward your buttocks, raising your knee up off the floor. Then slowly slide your foot back down until your leg is straight again.  Lying on your back spread your legs as far apart as you can without causing discomfort.  Lying on your side, raise your upper leg and foot straight up from the floor as far as is comfortable. Slowly lower the leg and repeat.  Lying on your back, tighten up the  muscle in the front of your thigh (quadriceps muscles). You can do this by keeping your leg straight and trying to raise your heel off the floor. This helps strengthen the largest muscle supporting your knee.  Lying on your back, tighten up the muscles of your buttocks both with the legs straight and with the knee bent at a comfortable angle while keeping your heel on the floor.   SKILLED REHAB INSTRUCTIONS: If the patient is transferred to a skilled rehab facility following release from the hospital, a list of the current medications will be sent to the facility for the patient to continue.  When discharged from the skilled rehab facility, please have the facility set up the patient's Home Health Physical Therapy prior to being released. Also, the skilled facility will be responsible for providing the patient with their medications at time of release from the facility to include their pain medication and their blood thinner medication. If the patient is still at the rehab facility at time of the two week follow up appointment, the skilled rehab facility will also need to assist the patient in arranging follow up appointment in our office and any transportation needs.  MAKE SURE YOU:  Understand these instructions.  Will watch your condition.  Will get help right away if you are not doing well or get worse.  Pick up stool softner and laxative for home use following surgery while on pain medications. Daily dry dressing changes as needed. In 4 days, you may remove your dressings and begin taking showers - no tub baths or soaking the incisions. Continue to use ice for pain and swelling after surgery. Do not use any lotions or creams on the incision until instructed by your surgeon.   

## 2019-12-11 NOTE — Anesthesia Postprocedure Evaluation (Signed)
Anesthesia Post Note  Patient: Carlos [name]  Procedure(s) Performed: INTRAMEDULLARY (IM) NAIL INTERTROCHANTRIC (Left Hip)     Patient location during evaluation: PACU Anesthesia Type: General Level of consciousness: awake and alert Pain management: pain level controlled Vital Signs Assessment: post-procedure vital signs reviewed and stable Respiratory status: spontaneous breathing, nonlabored ventilation, respiratory function stable and patient connected to nasal cannula oxygen Cardiovascular status: blood pressure returned to baseline and stable Postop Assessment: no apparent nausea or vomiting Anesthetic complications: no   No complications documented.  Last Vitals:  Vitals:   12/11/19 1040 12/11/19 1117  BP: (!) 154/76 136/78  Pulse: 89 90  Resp: 18 16  Temp:  36.7 C  SpO2: 98% 100%    Last Pain:  Vitals:   12/11/19 1117  TempSrc: Oral  PainSc:                  Atisha Hamidi DAVID

## 2019-12-12 ENCOUNTER — Other Ambulatory Visit: Payer: Self-pay

## 2019-12-12 DIAGNOSIS — L8992 Pressure ulcer of unspecified site, stage 2: Secondary | ICD-10-CM

## 2019-12-12 DIAGNOSIS — L8990 Pressure ulcer of unspecified site, unspecified stage: Secondary | ICD-10-CM | POA: Insufficient documentation

## 2019-12-12 DIAGNOSIS — G40919 Epilepsy, unspecified, intractable, without status epilepticus: Secondary | ICD-10-CM | POA: Diagnosis not present

## 2019-12-12 DIAGNOSIS — E872 Acidosis: Secondary | ICD-10-CM | POA: Diagnosis not present

## 2019-12-12 HISTORY — DX: Pressure ulcer of unspecified site, stage 2: L89.92

## 2019-12-12 LAB — CBC
HCT: 29.3 % — ABNORMAL LOW (ref 39.0–52.0)
Hemoglobin: 9.6 g/dL — ABNORMAL LOW (ref 13.0–17.0)
MCH: 30.8 pg (ref 26.0–34.0)
MCHC: 32.8 g/dL (ref 30.0–36.0)
MCV: 93.9 fL (ref 80.0–100.0)
Platelets: 113 10*3/uL — ABNORMAL LOW (ref 150–400)
RBC: 3.12 MIL/uL — ABNORMAL LOW (ref 4.22–5.81)
RDW: 16.5 % — ABNORMAL HIGH (ref 11.5–15.5)
WBC: 7.9 10*3/uL (ref 4.0–10.5)
nRBC: 0 % (ref 0.0–0.2)

## 2019-12-12 LAB — BASIC METABOLIC PANEL
Anion gap: 9 (ref 5–15)
BUN: 10 mg/dL (ref 8–23)
CO2: 27 mmol/L (ref 22–32)
Calcium: 8.5 mg/dL — ABNORMAL LOW (ref 8.9–10.3)
Chloride: 101 mmol/L (ref 98–111)
Creatinine, Ser: 0.68 mg/dL (ref 0.61–1.24)
GFR, Estimated: >60 mL/min (ref >60)
Glucose, Bld: 111 mg/dL — ABNORMAL HIGH (ref 70–99)
Potassium: 3.5 mmol/L (ref 3.5–5.1)
Sodium: 137 mmol/L (ref 135–145)

## 2019-12-12 NOTE — Consult Note (Signed)
Physical Medicine and Rehabilitation Consult Reason for Consult: Breakthrough seizures Referring Physician: Magnus Sinning, MD   HPI: Carlos [name] is a 74 y.o. 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 He fell off his bed in [facility] and broke his left hip, s/p L hip IM nailing 12/11/19. He thinks he is at work right now and says he never missed a day of work.    Review of Systems  Constitutional: Negative.   HENT: Negative.   Eyes: Negative.   Respiratory: Negative.   Cardiovascular: Negative.   Gastrointestinal: Negative.   Genitourinary: Negative.   Musculoskeletal: Negative.   Skin: Negative.   Neurological: Negative.   Endo/Heme/Allergies: Negative.   Psychiatric/Behavioral: Positive for memory loss.   Past Medical History:  Diagnosis Date  . Anxiety   . Benign prostatic hyperplasia 03/30/2013   10/1 IMO update  . Cancer ([place])   . 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 Seizures ([place])    Past Surgical History:  Procedure Laterality Date  . PORTACATH PLACEMENT N/A 07/01/2019   Procedure: PORT ULTRASOUND GUIDED PLACEMENT;  Surgeon: Alphonsa Overall, MD;  Location: [facility];  Service: General;  Laterality: N/A;  . PROSTATE SURGERY  2004  . TONSILECTOMY, ADENOIDECTOMY, BILATERAL MYRINGOTOMY AND TUBES     Family History  Problem Relation Age of Onset  . Osteoporosis Mother   . Dementia Mother   . Diabetes Mother   . Brain cancer Father    Social History:  reports that he has never smoked. He has never used smokeless tobacco. He reports that he does not drink alcohol and does not use drugs. Allergies: No Known Allergies Medications Prior to Admission  Medication Sig Dispense Refill  . bismuth subsalicylate (PEPTO BISMOL) 262 MG/15ML suspension Take 30 mLs by mouth  every 6 (six) hours as needed for indigestion.    . capecitabine (XELODA) 500 MG tablet Take 3 tablets (1,500 mg total) by mouth 2 (two) times daily after a meal. Take on days of radiation, Monday through Friday (Patient taking differently: Take 500 mg by mouth See admin instructions. Takes 3 tablets in the morning and 3 tablets at night .Take on days of radiation, Monday through Friday. Takes after the radiation.) 90 tablet 0  . HYDROcodone-Acetaminophen 5-300 MG TABS Take 1 tablet by mouth every 8 (eight) hours as needed (for severe pain as needed). 20 tablet 0  . levETIRAcetam (KEPPRA) 1000 MG tablet Take 1 tablet (1,000 mg total) by mouth 2 (two) times daily. 60 tablet 11  . lidocaine-prilocaine (EMLA) cream Apply 1 application topically daily as needed (pain).     . Multiple Vitamin (MULTIVITAMIN WITH MINERALS) TABS tablet Take 1 tablet by mouth daily.    Marland Kitchen OVER THE COUNTER MEDICATION Take 1 tablet by mouth daily. Mind work -  Supplement for brain    . traMADol (ULTRAM) 50 MG tablet Take 1 tablet (50 mg total) by mouth every 6 (six) hours as needed for moderate pain. 30 tablet 0  . Lidocaine, Anorectal, 50 MG SUPP Place 50 mg rectally every 8 (eight) hours as needed. (Patient not taking: Reported on 12/08/2019) 14 suppository 2  . potassium chloride SA (KLOR-CON) 20 MEQ tablet Take 1 tablet (20 mEq total) by mouth daily. (Patient not taking: Reported on 12/08/2019) 14 tablet 0    Home: Home Living Family/patient  expects to be discharged to:: Private residence Living Arrangements: Spouse/significant other Available Help at Discharge: Family, Personal care attendant, Available 24 hours/day Type of Home: House Home Access: Stairs to enter, Ramped entrance Technical brewer of Steps: 2-3 Entrance Stairs-Rails: None Home Layout: Two level Alternate Level Stairs-Number of Steps: 13-16 Alternate Level Stairs-Rails: Right Bathroom Shower/Tub: Chiropodist:  Standard Bathroom Accessibility: Yes Home Equipment: Cane - single point (pt's mother utilizes all other DME owned)  Lives With: Spouse (caregivers/family present for 24 hour care)  Functional History: Prior Function Level of Independence: Independent 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post [employer] Functional Status:  Mobility: Bed Mobility Overal bed mobility: Needs Assistance Bed Mobility: Supine to Sit Supine to sit: Mod assist, HOB elevated General bed mobility comments: pt requires assistance to mobilize LLE and to pull trunk into sitting Transfers Overall transfer level: Needs assistance Equipment used: Rolling walker (2 wheeled) Transfers: Sit to/from Stand Sit to Stand: Mod assist General transfer comment: increased time, PT cueing for hand placement and technique. Pt requires PT physical assistance to power up into standing Ambulation/Gait Ambulation/Gait assistance: Mod assist Gait Distance (Feet): 2 Feet Assistive device: Rolling walker (2 wheeled) Gait Pattern/deviations: Step-to pattern General Gait Details: short step to gait with reduced LLE foot clearance, increased trunk flexion, 2 losses of balance when stepping backward to turn to chair Gait velocity: reduced Gait velocity interpretation: <1.31 ft/sec, indicative of household ambulator    ADL:    Cognition: Cognition Overall Cognitive Status: Impaired/Different from baseline Orientation Level: Oriented to person, Disoriented to place, Disoriented to time, Disoriented to situation Cognition Arousal/Alertness: Awake/alert Behavior During Therapy: WFL for tasks assessed/performed Overall Cognitive Status: Impaired/Different from baseline Area of Impairment: Orientation, Attention, Memory, Following commands, Safety/judgement, Awareness, Problem solving Orientation Level: Disoriented to, Time (pt is able to report date but poor memory of when sx happene) Current Attention Level: Sustained Memory: Decreased recall of  precautions, Decreased short-term memory Following Commands: Follows one step commands consistently Safety/Judgement: Decreased awareness of safety, Decreased awareness of deficits Awareness: Emergent Problem Solving: Slow processing, Difficulty sequencing, Requires verbal cues  Blood pressure 113/69, pulse 100, temperature 98.5 F (36.9 C), resp. rate 20, height 5\' 7"  (1.702 m), weight 61.7 kg, SpO2 99 %. Physical Exam   General: Very confused, AOx1 HEENT: Head is normocephalic, atraumatic, PERRLA, EOMI, sclera anicteric, oral mucosa pink and moist, dentition intact, ext ear canals clear,  Neck: Supple without JVD or lymphadenopathy Heart: Reg rate and rhythm. No murmurs rubs or gallops Chest: CTA bilaterally without wheezes, rales, or rhonchi; no distress Abdomen: Soft, non-tender, non-distended, bowel sounds positive. Extremities: No clubbing, cyanosis, or edema. Pulses are 2+ Skin: L Hip incision C/D/I, b/l buttock pressure injuries Neuro: Pt is very confused, thinks he is at work. Acknowledges memory loss. Cranial nerves 2-12 are intact. Sensory exam is normal. Reflexes are 2+ in all 4's. Fine motor coordination is intact. No tremors. Motor function is grossly 5/5.  Psych: Pt's affect is appropriate. Pt is cooperative   Results for orders placed or performed during the hospital encounter of 12/08/19 (from the past 24 hour(s))  CBC     Status: Abnormal   Collection Time: 12/12/19  1:57 AM  Result Value Ref Range   WBC 7.9 4.0 - 10.5 K/uL   RBC 3.12 (L) 4.22 - 5.81 MIL/uL   Hemoglobin 9.6 (L) 13.0 - 17.0 g/dL   HCT 29.3 (L) 39 - 52 %   MCV 93.9 80.0 - 100.0 fL   MCH 30.8 26.0 -  34.0 pg   MCHC 32.8 30.0 - 36.0 g/dL   RDW 16.5 (H) 11.5 - 15.5 %   Platelets 113 (L) 150 - 400 K/uL   nRBC 0.0 0.0 - 0.2 %  Basic metabolic panel     Status: Abnormal   Collection Time: 12/12/19  1:57 AM  Result Value Ref Range   Sodium 137 135 - 145 mmol/L   Potassium 3.5 3.5 - 5.1 mmol/L    Chloride 101 98 - 111 mmol/L   CO2 27 22 - 32 mmol/L   Glucose, Bld 111 (H) 70 - 99 mg/dL   BUN 10 8 - 23 mg/dL   Creatinine, Ser 0.68 0.61 - 1.24 mg/dL   Calcium 8.5 (L) 8.9 - 10.3 mg/dL   GFR, Estimated >60 >60 mL/min   Anion gap 9 5 - 15   Pelvis Portable  Result Date: 12/11/2019 CLINICAL DATA:  Status post ORIF of proximal left femur. EXAM: PORTABLE PELVIS 1-2 VIEWS COMPARISON:  Earlier today FINDINGS: The patient is status post ORIF of comminuted fracture deformity involving the intertrochanteric portions of the proximal left femur. There is been placement of IM rod with compression screw. The hardware components and fracture fragments are in anatomic alignment. IMPRESSION: 1. Status post ORIF of proximal left femur. Electronically Signed   By: Kerby Moors M.D.   On: 12/11/2019 10:23   DG C-Arm 1-60 Min  Result Date: 12/11/2019 CLINICAL DATA:  Left intertrochanteric hip fracture. EXAM: OPERATIVE LEFT HIP (WITH PELVIS IF PERFORMED) 3 VIEWS TECHNIQUE: Fluoroscopic spot image(s) were submitted for interpretation post-operatively. COMPARISON:  12/09/2019 FINDINGS: Intramedullary rod and compression screw fixation of the previously demonstrated left femoral intertrochanteric fracture with anatomic position and alignment. IMPRESSION: Hardware fixation of the previously demonstrated left femoral intertrochanteric fracture with anatomic position and alignment. Electronically Signed   By: Claudie Revering M.D.   On: 12/11/2019 08:59   DG HIP OPERATIVE UNILAT W OR W/O PELVIS LEFT  Result Date: 12/11/2019 CLINICAL DATA:  Left intertrochanteric hip fracture. EXAM: OPERATIVE LEFT HIP (WITH PELVIS IF PERFORMED) 3 VIEWS TECHNIQUE: Fluoroscopic spot image(s) were submitted for interpretation post-operatively. COMPARISON:  12/09/2019 FINDINGS: Intramedullary rod and compression screw fixation of the previously demonstrated left femoral intertrochanteric fracture with anatomic position and alignment.  IMPRESSION: Hardware fixation of the previously demonstrated left femoral intertrochanteric fracture with anatomic position and alignment. Electronically Signed   By: Claudie Revering M.D.   On: 12/11/2019 08:59     Assessment/Plan: Diagnosis: Breakthrough seziure 1. Does the need for close, 24 hr/day medical supervision in concert with the patient's rehab needs make it unreasonable for this patient to be served in a less intensive setting? Yes 2. Co-Morbidities requiring supervision/potential complications: closed comminuted intertrochanteric fracture of left femur, pressure injury of bilateral buttocks. PMH, mild neurocognitive disorder of unclear etiology, rectal adenocarcinoma 3. Due to bladder management, bowel management, safety, skin/wound care, disease management, medication administration, pain management, patient education, he require 24 hr/day rehab nursing? Yes 4. Does the patient require coordinated care of a physician, rehab nurse, therapy disciplines of PT, OT, SLP to address physical and functional deficits in the context of the above medical diagnosis(es)? Yes Addressing deficits in the following areas: balance, endurance, locomotion, strength, transferring, bowel/bladder control, bathing, dressing, feeding, grooming, toileting, cognition and psychosocial support 5. Can the patient actively participate in an intensive therapy program of at least 3 hrs of therapy per day at least 5 days per week? Yes 6. The potential for patient to make measurable gains while  on inpatient rehab is good 7. Anticipated functional outcomes upon discharge from inpatient rehab are supervision  with PT, supervision with OT, supervision with SLP. 8. Estimated rehab length of stay to reach the above functional goals is: 10-14 days 9. Anticipated discharge destination: Home 10. Overall Rehab/Functional Prognosis: good  RECOMMENDATIONS: This patient's condition is appropriate for continued rehabilitative care in  the following setting: CIR Patient has agreed to participate in recommended program. Yes Note that insurance prior authorization may be required for reimbursement for recommended care.  Comment: Carlos [name] would be a good candidate if he has 24/7 supervision due to his significant long-term cognitive deficits.  Izora Ribas, MD 12/12/2019

## 2019-12-12 NOTE — Progress Notes (Signed)
PROGRESS NOTE  Carlos [name]  DOB: 01-02-1946  PCP: [name], MD JOA:[id number]  DOA: 12/08/2019  LOS: 2 days   Chief Complaint  Patient presents with  . Seizures   Brief narrative: Patient is a 74 y.o. 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 Patient presented to the [facility] ED on 11/2 via EMS for a breakthrough seizure. Patient had radiation treatment on the day of admission. Family stated he took a nap after the treatment and was difficult to wake up. They also noticed blood in his mouth. EMS brought him to ED.  In the ED, patient was hemodynamically stable. While awaiting his complete workup, patient was found to have generalized tonic-clonic seizure activity which lasted for about a minute. Following the seizure the patient had several minutes of persistent clenching of the right hand and arm and did not follow any commands. He was treated with 2 mg of Ativan IM. He was also loaded with Keppra. He was observed in for several hours after his seizure he continued to be persistently somnolent and nonverbal. He then slowly began to awaken to verbal commands but was somnolent.  CT scan of head was unremarkable. He was loaded with Keppra 2 g and scheduled Keppra was continued at his home dosing regimen of 1 g BID PO.   While having seizure in the ED, patient fell out of his bed and landed on his left hip.  Left hip x-ray showed comminuted, angulated left intratrochanteric hip fracture with avulsion of the lesser trochanter.  Neurology and orthopedic consultation were obtained.  Patient was admitted to hospitalist service at [facility].  Subjective: Patient was seen and examined this morning.  Not in physical distress.  He slips into confusion in the middle of the talk. Easy to reorient.  Assessment/Plan: Breakthrough seizures History of  epilepsy -Unclear trigger of breakthrough seizures. -EEG showed bitemporal spikes consistent with patient known history of seizures.  No seizures were seen throughout the recording. -Postictal status is resolved.  Neurology consult appreciated. -CT scan of head and MRI brain did not show any evidence of intracranial abnormality. -Neurology has increased Keppra to 1250 mg twice daily. -Outpatient neurology follow-up. -Seizure precautions at discharge.  Left Hip Fracture -While having seizure in the ED, patient fell off his bed on his left side.   -X-ray of left hip showed comminuted, angulated left intratrochanteric hip fracture with avulsion of the lesser trochanter.  -Orthopedic consult appreciated.  -11/5 underwent intramedullary fixation of left femur fracture. -P.o. Norco as needed for moderate pain IV Dilaudid as needed for severe pain  Acute delirium -Patient has history of mild cognitive disorder. -In the hospital, he is having episodes of confusion but not agitated or restless.  Confusion likely multifactorial -hospitalization, fracture, seizure, pain medicines -Fall precautions.  Hypokalemia -Improved with replacement.  Recheck Recent Labs  Lab 12/07/19 1005 12/08/19 1708 12/08/19 1753 12/09/19 0408 12/12/19 0157  K 4.2 3.0*  --  4.0 3.5  MG  --   --  2.6* 2.2  --    Hyperammonemia -Initially 182, and then 24 -Unclear significance of the first elevated value, suspect an error.. Recent Labs  Lab 12/08/19 1708 12/08/19 2014  AMMONIA 182* 24   Rectal Adenocarcinoma -At home on antineoplastic agent with capecitabine 1500 g p.o. twice daily and continue with lidocaine suppository 50 mg rectally every 8 hours as needed for pain -Continue  to monitor -Oncology follow-up appreciated.  Mobility: PT eval after procedure Code Status:   Code Status: Full Code  Nutritional status: Body mass index is 21.3 kg/m.     Diet Order            Diet regular Room service  appropriate? Yes; Fluid consistency: Thin  Diet effective now                 DVT prophylaxis: enoxaparin (LOVENOX) injection 40 mg Start: 12/12/19 0800 SCDs Start: 12/11/19 1347   Antimicrobials:  None Fluid: None Consultants: Orthopedics, neurology Family Communication:  Not at bedside  Status is: Observation  The patient will require care spanning > 2 midnights and should be moved to inpatient because: Inpatient level of care appropriate due to severity of illness  Dispo: The patient is from: Home              Anticipated d/c is to: Pending PT eval              Anticipated d/c date is: > 3 days              Patient currently is not medically stable to d/c.       Infusions:  . methocarbamol (ROBAXIN) IV      Scheduled Meds: . Chlorhexidine Gluconate Cloth  6 each Topical Daily  . docusate sodium  100 mg Oral BID  . enoxaparin (LOVENOX) injection  40 mg Subcutaneous Q24H  . levETIRAcetam  1,250 mg Oral BID    Antimicrobials: Anti-infectives (From admission, onward)   Start     Dose/Rate Route Frequency Ordered Stop   12/11/19 1445  ceFAZolin (ANCEF) IVPB 2g/100 mL premix        2 g 200 mL/hr over 30 Minutes Intravenous Every 6 hours 12/11/19 1346 12/11/19 2143   12/11/19 0730  ceFAZolin (ANCEF) IVPB 2g/100 mL premix        2 g 200 mL/hr over 30 Minutes Intravenous On call to O.R. 12/11/19 0719 12/11/19 0737   12/11/19 0712  ceFAZolin (ANCEF) 2-4 GM/100ML-% IVPB       Note to Pharmacy: Imagene Riches   : cabinet override      12/11/19 0712 12/11/19 0750      PRN meds: HYDROcodone-acetaminophen, HYDROmorphone (DILAUDID) injection, Lidocaine (Anorectal), menthol-cetylpyridinium **OR** phenol, methocarbamol **OR** methocarbamol (ROBAXIN) IV, metoCLOPramide **OR** metoCLOPramide (REGLAN) injection, ondansetron **OR** ondansetron (ZOFRAN) IV, zolpidem   Objective: Vitals:   12/12/19 0438 12/12/19 0853  BP: 127/78 113/69  Pulse: 85 100  Resp: 19 20  Temp:  98.2 F (36.8 C) 98.5 F (36.9 C)  SpO2: 100% 99%    Intake/Output Summary (Last 24 hours) at 12/12/2019 1049 Last data filed at 12/11/2019 1948 Gross per 24 hour  Intake --  Output 600 ml  Net -600 ml   Filed Weights   12/09/19 2200 12/10/19 0312 12/11/19 0400  Weight: 63.9 kg 63.9 kg 61.7 kg   Weight change:  Body mass index is 21.3 kg/m.   Physical Exam: General exam: Appears calm and comfortable. Not in physical distress Skin: No rashes, lesions or ulcers. HEENT: Atraumatic, normocephalic, supple neck, no obvious bleeding Lungs: Clear to auscultation bilaterally CVS: Regular rate and rhythm, no murmur GI/Abd soft, nontender, nondistended, bowel sound present CNS: Alert, awake, able to answer questions but slipped into confusional state in the middle of conversation. Psychiatry: Mood appropriate Extremities: No pedal edema, no calf tenderness Data Review: I have personally reviewed the laboratory data and studies available.  Recent Labs  Lab 12/07/19 1005 12/08/19 1708 12/09/19 0408 12/11/19 1424 12/12/19 0157  WBC 3.7* 5.8 8.1 8.4 7.9  NEUTROABS 2.5 4.2 7.0  --   --   HGB 12.2* 13.3 10.8* 10.1* 9.6*  HCT 36.0* 43.0 32.9* 30.5* 29.3*  MCV 91.4 99.8 94.8 93.6 93.9  PLT 181 229 161 110* 113*   Recent Labs  Lab 12/07/19 1005 12/08/19 1708 12/08/19 1753 12/09/19 0408 12/11/19 1424 12/12/19 0157  NA 139 145  --  138  --  137  K 4.2 3.0*  --  4.0  --  3.5  CL 105 102  --  108  --  101  CO2 28 17*  --  23  --  27  GLUCOSE 105* 136*  --  135*  --  111*  BUN 9 13  --  14  --  10  CREATININE 0.76 0.92  --  0.70 0.66 0.68  CALCIUM 9.6 10.0  --  8.5*  --  8.5*  MG  --   --  2.6* 2.2  --   --     F/u labs ordered.  Signed, Terrilee Croak, MD [facility] 12/12/2019

## 2019-12-12 NOTE — Progress Notes (Signed)
Inpatient Rehab Admissions Coordinator Note:   Per PT recommendation, pt was screened for CIR candidacy by Gayland Curry, MS, CCC-SLP.  At this time we are recommending an inpatient rehab consult.  AC will place consult order per protocol.  Please contact me with questions.    Gayland Curry, [place], [place] Admissions Coordinator [phone] 12/12/19 12:38 PM

## 2019-12-12 NOTE — Progress Notes (Signed)
   Subjective: 1 Day Post-Op Procedure(s) (LRB): INTRAMEDULLARY (IM) NAIL INTERTROCHANTRIC (Left)  Pt doing well this morning Minimal pain to left hip/leg this morning Denies any new symptoms or issues Patient reports pain as mild.  Objective:   VITALS:   Vitals:   12/12/19 0035 12/12/19 0438  BP: 127/77 127/78  Pulse: 92 85  Resp: 18 19  Temp: 98.4 F (36.9 C) 98.2 F (36.8 C)  SpO2: 100% 100%    Left hip/.leg incisions healing well nv intact distally No rashes or edema distally  LABS Recent Labs    12/11/19 1424 12/12/19 0157  HGB 10.1* 9.6*  HCT 30.5* 29.3*  WBC 8.4 7.9  PLT 110* 113*    Recent Labs    12/11/19 1424 12/12/19 0157  NA  --  137  K  --  3.5  BUN  --  10  CREATININE 0.66 0.68  GLUCOSE  --  111*     Assessment/Plan: 1 Day Post-Op Procedure(s) (LRB): INTRAMEDULLARY (IM) NAIL INTERTROCHANTRIC (Left) Pt doing well PT/OT Pain management as needed Will continue to monitor his progress    Merla Riches PA-C, [place] [facility] [employer] [facility] [address], [place], [place], [place] [address] Phone: [phone] [email or web address] Facebook  [employer]

## 2019-12-12 NOTE — Progress Notes (Addendum)
Inpatient Rehab Admissions:  Inpatient Rehab Consult received.  I met with patient at the bedside for rehabilitation assessment and to discuss goals and expectations of an inpatient rehab admission.  Pt indicated that he was interested in pursuing CIR.  Due to his apparent confusion and family request, AC called pt's daughter, [name].  Explained CIR goals and expectations to her and she acknowledged understanding.  [name] is interested in pursuing CIR for pt.  [name] reported adequate disposition for pt to be considered an appropriate candidate for potential admission.  Awaiting OT/ST evaluations.  Will continue to follow progress with PT and medical workup.  Signed: Gayland Curry, MS, CCC-SLP Admissions Coordinator [phone]   [name] is pt's sister.

## 2019-12-12 NOTE — Evaluation (Signed)
Physical Therapy Evaluation Patient Details Name: Carlos [name] MRN: [id number] DOB: 1945-04-04 Today's Date: 12/12/2019   History of Present Illness  74 y.o.malewith an approximately 2 year history of epilepsy(on Keppra), mild neurocognitive disorder of unclear etiology, rectal adenocarcinoma, BPH, and anxiety. Patient presented to the [facility] ED on 11/2 via EMS for a breakthrough seizure. Patient had radiation treatment on the day of admission. Familystatedhe took a nap after the treatment and was difficult to wake up. They also noticedblood in hismouth. EMS brought him to ED. Pt found to have generalized tonic-clonic seizure in ED. Pt fell off bed at [facility] long and broke L hip. Pt underwent L hip IM nailing on 12/11/2019.  Clinical Impression  Pt presents to PT with deficits in functional mobility, gait, balance, endurance, strength, power, safety awareness, cognition. Pt is limited by L hip pain at this time and requires physical assistance to perform all functional mobility tasks. Pt experiences multiple losses of balance during short transfer from bed to recliner and is at a high falls risk currently. Pt will benefit from aggressive mobilization and PT POC to improve mobility quality and to aide in a return to his prior level of function. PT recommends discharge to CIR as the pt was independent prior to admission and demonstrates the potential to make significant functional gains with high intensity inpatient PT services.    Follow Up Recommendations CIR    Equipment Recommendations  Rolling walker with 5" wheels;Wheelchair (measurements PT) (if home today)    Recommendations for Other Services Rehab consult     Precautions / Restrictions Precautions Precautions: Fall Restrictions Weight Bearing Restrictions: Yes LUE Weight Bearing: Weight bearing as tolerated      Mobility  Bed Mobility Overal bed mobility: Needs Assistance Bed Mobility: Supine to Sit     Supine  to sit: Mod assist;HOB elevated     General bed mobility comments: pt requires assistance to mobilize LLE and to pull trunk into sitting    Transfers Overall transfer level: Needs assistance Equipment used: Rolling walker (2 wheeled) Transfers: Sit to/from Stand Sit to Stand: Mod assist         General transfer comment: increased time, PT cueing for hand placement and technique. Pt requires PT physical assistance to power up into standing  Ambulation/Gait Ambulation/Gait assistance: Mod assist Gait Distance (Feet): 2 Feet Assistive device: Rolling walker (2 wheeled) Gait Pattern/deviations: Step-to pattern Gait velocity: reduced Gait velocity interpretation: <1.31 ft/sec, indicative of household ambulator General Gait Details: short step to gait with reduced LLE foot clearance, increased trunk flexion, 2 losses of balance when stepping backward to turn to chair  Stairs            Wheelchair Mobility    Modified Rankin (Stroke Patients Only)       Balance Overall balance assessment: Needs assistance Sitting-balance support: Single extremity supported;Bilateral upper extremity supported;Feet supported Sitting balance-Leahy Scale: Poor Sitting balance - Comments: reliant on UE support of bed   Standing balance support: Bilateral upper extremity supported Standing balance-Leahy Scale: Poor Standing balance comment: min-modA with BUE support of RW                             Pertinent Vitals/Pain Pain Assessment: Faces Faces Pain Scale: Hurts even more Pain Location: L hip with mobility Pain Descriptors / Indicators: Aching Pain Intervention(s): Monitored during session    Home Living Family/patient expects to be discharged to:: Private residence Living  Arrangements: Parent (mother) Available Help at Discharge: Personal care attendant (near 24/7 assistance from staff per pt and sister) Type of Home: House Home Access: Stairs to enter;Ramped  entrance Entrance Stairs-Rails: Right Entrance Stairs-Number of Steps: 3 Home Layout: Two level Home Equipment: [place] - single point (pt's mother utilizes all other DME owned)      Prior Function Level of Independence: Independent         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[employer], maintenance (IT) Dominance   Dominant Hand: Right    Extremity/Trunk Assessment   Upper Extremity Assessment Upper Extremity Assessment: Overall WFL for tasks assessed    Lower Extremity Assessment Lower Extremity Assessment: LLE deficits/detail LLE Deficits / Details: L hip flexion 3-/5, knee flexion/extension 3-/5, ankle PF/DF 4/5, pain limiting    Cervical / Trunk Assessment Cervical / Trunk Assessment: Normal  Communication   Communication: No difficulties  Cognition Arousal/Alertness: Awake/alert Behavior During Therapy: WFL for tasks assessed/performed Overall Cognitive Status: Impaired/Different from baseline Area of Impairment: Orientation;Attention;Memory;Following commands;Safety/judgement;Awareness;Problem solving                 Orientation Level: Disoriented to;Time (pt is able to report date but poor memory of when sx happene) Current Attention Level: Sustained Memory: Decreased recall of precautions;Decreased short-term memory Following Commands: Follows one step commands consistently Safety/Judgement: Decreased awareness of safety;Decreased awareness of deficits Awareness: Emergent Problem Solving: Slow processing;Difficulty sequencing;Requires verbal cues        General Comments General comments (skin integrity, edema, etc.): VSS on RA. initially upon arrival pt reports he had seen a sofa moving across the room and behind the bed. RN made aware    Exercises General Exercises - Lower Extremity Ankle Circles/Pumps: AROM;Left;10 reps Quad Sets: AROM;Left;10 reps Gluteal Sets: AROM;Both;10 reps   Assessment/Plan    PT Assessment Patient needs continued PT services  PT Problem  List Decreased strength;Decreased activity tolerance;Decreased balance;Decreased mobility;Decreased knowledge of use of DME;Decreased safety awareness;Decreased knowledge of precautions;Pain       PT Treatment Interventions DME instruction;Gait training;Stair training;Functional mobility training;Therapeutic activities;Therapeutic exercise;Balance training;Neuromuscular re-education;Patient/family education    PT Goals (Current goals can be found in the Care Plan section)  Acute Rehab PT Goals Patient Stated Goal: To return to his prior level of function and to work eventually PT Goal Formulation: With patient Time For Goal Achievement: 12/26/19 Potential to Achieve Goals: Good    Frequency Min 5X/week   Barriers to discharge        Co-evaluation               AM-PAC PT "6 Clicks" Mobility  Outcome Measure Help needed turning from your back to your side while in a flat bed without using bedrails?: A Little Help needed moving from lying on your back to sitting on the side of a flat bed without using bedrails?: A Lot Help needed moving to and from a bed to a chair (including a wheelchair)?: A Lot Help needed standing up from a chair using your arms (e.g., wheelchair or bedside chair)?: A Lot Help needed to walk in hospital room?: A Lot Help needed climbing 3-5 steps with a railing? : Total 6 Click Score: 12    End of Session   Activity Tolerance: Patient limited by pain Patient left: in chair;with call bell/phone within reach;with chair alarm set Nurse Communication: Mobility status;Need for lift equipment (stedy) PT Visit Diagnosis: Unsteadiness on feet (R26.81);Other abnormalities of gait and mobility (R26.89);Muscle weakness (generalized) (M62.81);Pain Pain - Right/Left: Left  Pain - part of body: Hip    Time: 2194-7125 PT Time Calculation (min) (ACUTE ONLY): 40 min   Charges:   PT Evaluation $PT Eval Moderate Complexity: 1 Mod          Zenaida Niece, PT,  DPT Acute Rehabilitation Pager: [phone]   Zenaida Niece 12/12/2019, 12:08 PM

## 2019-12-12 NOTE — TOC Initial Note (Signed)
Transition of Care [facility]) - Initial/Assessment Note    Patient Details  Name: Carlos [name] MRN: [id number] Date of Birth: [birth year]  Transition [facility]) CM/SW Contact:    Carlos [name], [place] Phone Number: 12/12/2019, 5:04 PM  Clinical Narrative:                  CSW received request to contact pt's sister Carlos [name] [phone]) as the family had questions. Carlos [name] added her sister Carlos [name] to the conversation and they informed CSW that pt had been in the [place] emergency room for seizures and was waiting to be transferred to a bed on 3w.  Pt fell in the emergency room and broke his hip, now requiring rehab. CIR is following, and the attending noted that pt will most likely be medically cleared for rehab on Monday. His family feels that [facility] should be financially responsible for his rehab, [place] emailed risk management and patient experience, as well as informing TOC supervisor. They are all aware of the situation.  Carlos [name] is the [place] for this pt and would like to be first contact with all information as pt is not a reliable source to remember or understand things. SW will continue to follow.   Expected Discharge Plan: IP Rehab Facility Barriers to Discharge: Barriers Unresolved (comment) (CIR pending family concerns and bed availability)   Patient Goals and CMS Choice Patient states their goals for this hospitalization and ongoing recovery are:: Pt unable to participate in goal setting due to disorientation, family agreeable to CIR. CMS Medicare.gov Compare Post Acute Care list provided to:: Patient Represenative (must comment) (Sister, Carlos [name]) Choice offered to / list presented to : Sibling  Expected Discharge Plan and Services Expected Discharge Plan: [place] In-house Referral: Clinical Social Work   Post Acute Care Choice: IP Rehab Living arrangements for the past 2 months: [place]                                      Prior Living  Arrangements/Services Living arrangements for the past 2 months: Single Family Home Lives with:: Siblings Patient language and need for interpreter reviewed:: Yes Do you feel safe going back to the place where you live?: Yes      Need for Family Participation in Patient Care: Yes (Comment) Care giver support system in place?: Yes (comment)   Criminal Activity/Legal Involvement Pertinent to Current Situation/Hospitalization: No - Comment as needed  Activities of Daily Living Home Assistive Devices/Equipment: Eyeglasses ADL Screening (condition at time of admission) Patient's cognitive ability adequate to safely complete daily activities?: No Is the patient deaf or have difficulty hearing?: No Does the patient have difficulty seeing, even when wearing glasses/contacts?: No Does the patient have difficulty concentrating, remembering, or making decisions?: Yes (has "mci" per sister) Patient able to express need for assistance with ADLs?: Yes Does the patient have difficulty dressing or bathing?: No Independently performs ADLs?: Yes (appropriate for developmental age) Does the patient have difficulty walking or climbing stairs?: No Weakness of Legs: Both Weakness of Arms/Hands: Both  Permission Sought/Granted Permission sought to share information with : Family Supports    Share Information with NAME: Carlos [name]     Permission granted to share info w Relationship: Sister, Power of [employer] granted to share info w Contact Information: [phone]  Emotional Assessment Appearance:: Other (Comment Required (Unable to Assess)  Attitude/Demeanor/Rapport: Unable to Assess Affect (typically observed): Unable to Assess Orientation: : Oriented to Self Alcohol / Substance Use: Not Applicable Psych Involvement: No (comment)  Admission diagnosis:  Seizures ([place]) [R56.9] Preop examination [Z01.818] Breakthrough seizure ([place]) [G40.919] Closed comminuted intertrochanteric  fracture of left femur ([place]) [S72.142A] Patient Active Problem List   Diagnosis Date Noted  . Pressure injury of skin 12/12/2019  . Closed comminuted intertrochanteric fracture of left femur ([place])   . Breakthrough seizure ([place]) 12/08/2019  . Acute encephalopathy 09/11/2019  . Fever 09/11/2019  . Port-A-Cath in place 09/11/2019  . Rectal adenocarcinoma ([place]) 06/18/2019  . 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 Benign prostatic hyperplasia 03/30/2013   PCP:  [name], MD Pharmacy:   [facility] [place], [address] AT [address] [place] [address] Phone: [phone] Fax: [phone]     Social Determinants of Health (SDOH) Interventions    Readmission Risk Interventions No flowsheet data found.

## 2019-12-13 ENCOUNTER — Encounter (HOSPITAL_COMMUNITY): Payer: Self-pay | Admitting: Orthopedic Surgery

## 2019-12-13 DIAGNOSIS — E872 Acidosis: Secondary | ICD-10-CM | POA: Diagnosis not present

## 2019-12-13 LAB — BASIC METABOLIC PANEL
Anion gap: 11 (ref 5–15)
BUN: 13 mg/dL (ref 8–23)
CO2: 25 mmol/L (ref 22–32)
Calcium: 8.3 mg/dL — ABNORMAL LOW (ref 8.9–10.3)
Chloride: 101 mmol/L (ref 98–111)
Creatinine, Ser: 0.59 mg/dL — ABNORMAL LOW (ref 0.61–1.24)
GFR, Estimated: >60 mL/min (ref >60)
Glucose, Bld: 109 mg/dL — ABNORMAL HIGH (ref 70–99)
Potassium: 3.2 mmol/L — ABNORMAL LOW (ref 3.5–5.1)
Sodium: 137 mmol/L (ref 135–145)

## 2019-12-13 LAB — CBC
HCT: 27.6 % — ABNORMAL LOW (ref 39.0–52.0)
Hemoglobin: 9.3 g/dL — ABNORMAL LOW (ref 13.0–17.0)
MCH: 31.2 pg (ref 26.0–34.0)
MCHC: 33.7 g/dL (ref 30.0–36.0)
MCV: 92.6 fL (ref 80.0–100.0)
Platelets: 148 10*3/uL — ABNORMAL LOW (ref 150–400)
RBC: 2.98 MIL/uL — ABNORMAL LOW (ref 4.22–5.81)
RDW: 16.5 % — ABNORMAL HIGH (ref 11.5–15.5)
WBC: 5.9 10*3/uL (ref 4.0–10.5)
nRBC: 0 % (ref 0.0–0.2)

## 2019-12-13 MED ORDER — MELATONIN 3 MG PO TABS
3.0000 mg | ORAL_TABLET | Freq: Every evening | ORAL | Status: DC | PRN
  Administered 2019-12-14: 3 mg via ORAL
  Filled 2019-12-13: qty 1

## 2019-12-13 MED ORDER — POTASSIUM CHLORIDE CRYS ER 20 MEQ PO TBCR
40.0000 meq | EXTENDED_RELEASE_TABLET | Freq: Once | ORAL | Status: AC
  Administered 2019-12-13: 40 meq via ORAL
  Filled 2019-12-13: qty 2

## 2019-12-13 NOTE — Progress Notes (Addendum)
PROGRESS NOTE  Carlos [name]  DOB: 10-Jul-1945  PCP: [name], MD YOV:[id number]  DOA: 12/08/2019  LOS: 3 days   Chief Complaint  Patient presents with  . Seizures   Brief narrative: Patient is a 74 y.o. 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 Patient presented to the [facility] ED on 11/2 via EMS for a breakthrough seizure. Patient had radiation treatment on the day of admission. Family stated he took a nap after the treatment and was difficult to wake up. They also noticed blood in his mouth. EMS brought him to ED.  In the ED, patient was hemodynamically stable but he had seizures while in the ED.  He was started on Keppra. Unfortunately, during one of the seizure episodes, patient fell out of his bed and landed on his left hip.  Left hip x-ray showed comminuted, angulated left intratrochanteric hip fracture with avulsion of the lesser trochanter.  Neurology and orthopedic consultation were obtained.   Patient was admitted to hospitalist service at [facility]. 11/5, patient underwent intramedullary fixation of left femur fracture Currently waiting for placement  Subjective: Patient was seen and examined this morning.  Not in physical distress.  Awake, alert, oriented to place and person.    Assessment/Plan: Breakthrough seizures History of epilepsy -Unclear trigger of breakthrough seizures. -EEG showed bitemporal spikes consistent with patient known history of seizures.  No seizures were seen throughout the recording. -Postictal status is resolved.  Neurology consult appreciated. -CT scan of head and MRI brain did not show any evidence of intracranial abnormality. -Neurology increased Keppra from 1000 mg twice daily to 1250 mg twice daily. -Outpatient neurology follow-up. -Seizure precautions at discharge.  Left Hip Fracture -While having  seizure in the ED, patient fell off his bed on his left side.   -X-ray of left hip showed comminuted, angulated left intratrochanteric hip fracture with avulsion of the lesser trochanter.  -Orthopedic consult appreciated.  -11/5 underwent intramedullary fixation of left femur fracture. -Overall Norco as needed for pain control. -Working with physical therapy.  CIR recommended. -Currently on Lovenox for DVT prophylaxis.  Aspirin at discharge per orthopedics.  Acute delirium History of mild cognitive disorder -While in the hospital, he is having episodes of confusion but not agitated or restless.  Confusion likely multifactorial -hospitalization, fracture, seizure, pain medicines -Not restless or agitated.  Easily reorientable. -Fall precautions. -Melatonin as needed at bedtime -Continue to monitor for temperature, WBC count  Hypokalemia -Potassium level low at 3.2 today.  Oral replacement with 40 mEq given. Recent Labs  Lab 12/07/19 1005 12/08/19 1708 12/08/19 1753 12/09/19 0408 12/12/19 0157 12/13/19 0424  K 4.2 3.0*  --  4.0 3.5 3.2*  MG  --   --  2.6* 2.2  --   --    Hyperammonemia -Initially 182, and then 24 -Unclear significance of the first elevated value, suspect an error. Recent Labs  Lab 12/08/19 1708 12/08/19 2014  AMMONIA 182* 24   Rectal Adenocarcinoma -At home on antineoplastic agent with capecitabine 1500 g p.o. twice daily and continue with lidocaine suppository 50 mg rectally every 8 hours as needed for pain -Continue to monitor -Oncology follow-up appreciated.  Mobility: PT eval after procedure Code Status:   Code Status: Full Code  Nutritional status: Body mass index is 20.99 kg/m.     Diet Order            Diet  regular Room service appropriate? Yes; Fluid consistency: Thin  Diet effective now                 DVT prophylaxis: enoxaparin (LOVENOX) injection 40 mg Start: 12/12/19 0800 SCDs Start: 12/11/19 1347   Antimicrobials:   None Fluid: None Consultants: Orthopedics, neurology Family Communication: : Updated patient's sister Ms. [name] on the phone.  Answered questions to satisfaction.  Status is: Inpatient  Remains inpatient appropriate because: Pending CIR placement  Dispo: The patient is from: Home              Anticipated d/c is to: CIR              Anticipated d/c date is: Whenever bed/authorization available              Patient currently is medically stable to d/c.   Infusions:  . methocarbamol (ROBAXIN) IV      Scheduled Meds: . Chlorhexidine Gluconate Cloth  6 each Topical Daily  . docusate sodium  100 mg Oral BID  . enoxaparin (LOVENOX) injection  40 mg Subcutaneous Q24H  . levETIRAcetam  1,250 mg Oral BID    Antimicrobials: Anti-infectives (From admission, onward)   Start     Dose/Rate Route Frequency Ordered Stop   12/11/19 1445  ceFAZolin (ANCEF) IVPB 2g/100 mL premix        2 g 200 mL/hr over 30 Minutes Intravenous Every 6 hours 12/11/19 1346 12/11/19 2143   12/11/19 0730  ceFAZolin (ANCEF) IVPB 2g/100 mL premix        2 g 200 mL/hr over 30 Minutes Intravenous On call to O.R. 12/11/19 0719 12/11/19 0737   12/11/19 0712  ceFAZolin (ANCEF) 2-4 GM/100ML-% IVPB       Note to Pharmacy: Imagene Riches   : cabinet override      12/11/19 0712 12/11/19 0750      PRN meds: HYDROcodone-acetaminophen, Lidocaine (Anorectal), melatonin, menthol-cetylpyridinium **OR** phenol, methocarbamol **OR** methocarbamol (ROBAXIN) IV, metoCLOPramide **OR** metoCLOPramide (REGLAN) injection, ondansetron **OR** ondansetron (ZOFRAN) IV   Objective: Vitals:   12/13/19 0456 12/13/19 0911  BP: 118/73 117/71  Pulse: 92 84  Resp: 19 18  Temp: 98.4 F (36.9 C) 98.5 F (36.9 C)  SpO2: 99% 100%    Intake/Output Summary (Last 24 hours) at 12/13/2019 1117 Last data filed at 12/12/2019 2000 Gross per 24 hour  Intake --  Output 300 ml  Net -300 ml   Filed Weights   12/10/19 0312 12/11/19 0400 12/13/19  0457  Weight: 63.9 kg 61.7 kg 60.8 kg   Weight change:  Body mass index is 20.99 kg/m.   Physical Exam: General exam: Appears calm and comfortable. Not in physical distress Skin: No rashes, lesions or ulcers. HEENT: Atraumatic, normocephalic, supple neck, no obvious bleeding Lungs: Clear to auscultation bilaterally CVS: Regular rate and rhythm, no murmur GI/Abd soft, nontender, nondistended, bowel sound present CNS: Alert, awake, oriented to place and person.  Unclear about time.  He tends to slip into episodes of confusion at times. Psychiatry: Mood appropriate Extremities: No pedal edema, no calf tenderness Data Review: I have personally reviewed the laboratory data and studies available.  Recent Labs  Lab 12/07/19 1005 12/07/19 1005 12/08/19 1708 12/09/19 0408 12/11/19 1424 12/12/19 0157 12/13/19 0424  WBC 3.7*  --  5.8 8.1 8.4 7.9 5.9  NEUTROABS 2.5  --  4.2 7.0  --   --   --   HGB 12.2*  --  13.3 10.8* 10.1* 9.6* 9.3*  HCT  36.0*   < > 43.0 32.9* 30.5* 29.3* 27.6*  MCV 91.4   < > 99.8 94.8 93.6 93.9 92.6  PLT 181  --  229 161 110* 113* 148*   < > = values in this interval not displayed.   Recent Labs  Lab 12/07/19 1005 12/08/19 1708 12/08/19 1753 12/09/19 0408 12/11/19 1424 12/12/19 0157 12/13/19 0424  NA 139 145  --  138  --  137 137  K 4.2 3.0*  --  4.0  --  3.5 3.2*  CL 105 102  --  108  --  101 101  CO2 28 17*  --  23  --  27 25  GLUCOSE 105* 136*  --  135*  --  111* 109*  BUN 9 13  --  14  --  10 13  CREATININE 0.76 0.92  --  0.70 0.66 0.68 0.59*  CALCIUM 9.6 10.0  --  8.5*  --  8.5* 8.3*  MG  --   --  2.6* 2.2  --   --   --     F/u labs ordered.  Signed, Terrilee Croak, MD [facility] 12/13/2019

## 2019-12-13 NOTE — Evaluation (Signed)
Occupational Therapy Evaluation Patient Details Name: Carlos [name] MRN: [id number] DOB: 13-Aug-1945 Today's Date: 12/13/2019    History of Present Illness 74 y.o.malewith an approximately 2 year history of epilepsy(on Keppra), mild neurocognitive disorder of unclear etiology, rectal adenocarcinoma, BPH, and anxiety. Patient presented to the [facility] ED on 11/2 via EMS for a breakthrough seizure. Patient had radiation treatment on the day of admission. Familystatedhe took a nap after the treatment and was difficult to wake up. They also noticedblood in hismouth. EMS brought him to ED. Pt found to have generalized tonic-clonic seizure in ED. Pt fell off bed at [facility] long and broke L hip. Pt underwent L hip IM nailing on 12/11/2019.   Clinical Impression   This 74 y/o male presents with the above. PTA pt reports being independent with ADL and functional mobility. Pt currently presenting with the above and below listed deficits including pain, impaired cognition, poor sitting/standing balance impacting his overall functional performance. Pt requiring maxA for bed mobility and up to maxA for sit<>stand at [facility] today. He requires max - totalA for LB and toileting ADL, minA for seated UB ADL. Pt requiring up to max multimodal cues for sequencing/motor planning mobility tasks today as well as redirection for command follow. He will benefit from continued acute OT services and currently recommend CIR level therapies at time of discharge to maximize his overall safety and independence with ADL and mobility.     Follow Up Recommendations  CIR    Equipment Recommendations  3 in 1 bedside commode;Other (comment) (TBD)    Recommendations for Other Services Rehab consult     Precautions / Restrictions Precautions Precautions: Fall Restrictions Weight Bearing Restrictions: Yes LUE Weight Bearing: Weight bearing as tolerated      Mobility Bed Mobility Overal bed mobility: Needs  Assistance Bed Mobility: Supine to Sit;Sit to Supine     Supine to sit: HOB elevated;Max assist Sit to supine: Mod assist   General bed mobility comments: pt requires MAX multimodal cues for following instruction/sequencing mobility to EOB, requires assist for LEs given LLE pain and for trunk elevation. Assist for LEs back onto bed when returning to supine.     Transfers Overall transfer level: Needs assistance Equipment used: Ambulation equipment used Transfers: Sit to/from Stand Sit to Stand: Max assist         General transfer comment: VCs for safety and safe hand placement, boosting and steadying assist throughout. Pt able to maintain static standing with modA at [facility]. Deferred mobility OOB given recent impulsivity/restlessness and RN with concerns of having pt OOB without supervision     Balance Overall balance assessment: Needs assistance Sitting-balance support: Single extremity supported;Bilateral upper extremity supported;Feet supported Sitting balance-Leahy Scale: Poor Sitting balance - Comments: reliant on UE support of bed   Standing balance support: Bilateral upper extremity supported Standing balance-Leahy Scale: Poor Standing balance comment: external assist and UE support                            ADL either performed or assessed with clinical judgement   ADL Overall ADL's : Needs assistance/impaired Eating/Feeding: Set up;Sitting   Grooming: Min guard;Sitting   Upper Body Bathing: Minimal assistance;Sitting   Lower Body Bathing: Maximal assistance;Sit to/from stand;Sitting/lateral leans   Upper Body Dressing : Minimal assistance;Sitting   Lower Body Dressing: Maximal assistance;Sit to/from stand       Toileting- Water quality scientist and Hygiene: Total assistance;Sitting/lateral lean;Sit to/from stand  Functional mobility during ADLs: Maximal assistance (sit<>stand at [facility])                    Pertinent Vitals/Pain Pain  Assessment: Faces Faces Pain Scale: Hurts even more Pain Location: L hip with mobility Pain Descriptors / Indicators: Aching;Discomfort;Grimacing Pain Intervention(s): Monitored during session;Repositioned;Patient requesting pain meds-RN notified;Limited activity within patient's tolerance     Hand Dominance Right   Extremity/Trunk Assessment Upper Extremity Assessment Upper Extremity Assessment: Generalized weakness   Lower Extremity Assessment Lower Extremity Assessment: Defer to PT evaluation   Cervical / Trunk Assessment Cervical / Trunk Assessment: Normal   Communication Communication Communication: No difficulties   Cognition Arousal/Alertness: Awake/alert Behavior During Therapy: WFL for tasks assessed/performed Overall Cognitive Status: Impaired/Different from baseline Area of Impairment: Attention;Memory;Following commands;Safety/judgement;Awareness;Problem solving;Orientation                   Current Attention Level: Sustained Memory: Decreased recall of precautions;Decreased short-term memory Following Commands: Follows one step commands inconsistently Safety/Judgement: Decreased awareness of safety;Decreased awareness of deficits Awareness: Emergent Problem Solving: Slow processing;Difficulty sequencing;Requires verbal cues;Requires tactile cues General Comments: pt with notable difficulty motor planning/carrying out instruction today. stating multiple times he can get up and walk to the bathroom despite having required increased assist with mobility this admission    General Comments       Exercises     Shoulder Instructions      Home Living Family/patient expects to be discharged to:: Private residence Living Arrangements: Parent (mother) Available Help at Discharge: Personal care attendant (near 24/7 assistance from staff per pt and sister) Type of Home: House Home Access: Stairs to enter;Ramped entrance Entrance Stairs-Number of Steps:  3 Entrance Stairs-Rails: Right Home Layout: Two level Alternate Level Stairs-Number of Steps: 1 flight Alternate Level Stairs-Rails: Right;Left Bathroom Shower/Tub: Teacher, early years/pre: Standard     Home Equipment: [employer] - single point (pt's mother utilizes all other DME owned)          Prior Functioning/Environment Level of Independence: Independent        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post office        OT Problem List: Decreased strength;Decreased range of motion;Decreased activity tolerance;Impaired balance (sitting and/or standing);Decreased cognition;Decreased safety awareness;Decreased knowledge of use of DME or AE;Decreased knowledge of precautions;Pain;Decreased coordination      OT Treatment/Interventions: Self-care/ADL training;Therapeutic exercise;Energy conservation;DME and/or AE instruction;Therapeutic activities;Cognitive remediation/compensation;Patient/family education;Balance training    OT Goals(Current goals can be found in the care plan section) Acute Rehab OT Goals Patient Stated Goal: To return to his prior level of function and to work eventually OT Goal Formulation: With patient Time For Goal Achievement: 12/27/19 Potential to Achieve Goals: Good  OT Frequency: Min 2X/week   Barriers to D/C:            Co-evaluation              AM-PAC OT "6 Clicks" Daily Activity     Outcome Measure Help from another person eating meals?: A Little Help from another person taking care of personal grooming?: A Little Help from another person toileting, which includes using toliet, bedpan, or urinal?: Total Help from another person bathing (including washing, rinsing, drying)?: A Lot Help from another person to put on and taking off regular upper body clothing?: A Lot Help from another person to put on and taking off regular lower body clothing?: A Lot 6 Click Score: 13   End of Session Equipment Utilized During Treatment:  Gait belt Nurse  Communication: Mobility status;Patient requests pain meds  Activity Tolerance: Patient tolerated treatment well Patient left: in bed;with call bell/phone within reach;with bed alarm set  OT Visit Diagnosis: Other abnormalities of gait and mobility (R26.89);Other symptoms and signs involving cognitive function;Pain Pain - Right/Left: Left Pain - part of body: Hip;Knee                Time: 9828-6751 OT Time Calculation (min): 29 min Charges:  OT General Charges $OT Visit: 1 Visit OT Evaluation $OT Eval Moderate Complexity: 1 Mod OT Treatments $Self Care/Home Management : 8-22 mins  Lou Cal, OT Acute Rehabilitation Services Pager [phone] Office [phone]   Raymondo Band 12/13/2019, 4:05 PM

## 2019-12-13 NOTE — PMR Pre-admission (Signed)
PMR Admission Coordinator Pre-Admission Assessment  Patient: Carlos [name] is an 74 y.o., male MRN: [id number] DOB: 23-Jul-1945 Height: 5' 7"  (170.2 cm) Weight: 60.3 kg              Insurance Information HMO:     PPO:      PCP:      IPA:      80/20:      OTHER: POS  PRIMARY: [employer] NAP    Policy#: [id number]      Subscriber: patient CM Name: Myles Gip      Phone#: [phone]     Fax#: [phone] Pre-Cert#: [id number].  Approved for 7 days (11/9-11/15).  Review/clinicals due on 11/16.  Fax should be sent to [facility].      Employer:  Benefits:  Phone #: n/a-online at [email or web address] provider portal     Name:  Eff. Date: 02/06/2016-present     Deduct: $350 ($350 met)      Out of Pocket Max: $6,000 ($6,000 met)      Life Max: NA  CIR: $200 co-pay per admission, 100% coverage       SNF: 90% coverage, 10% co-insurance; limited to 40 visits Outpatient: 90% coverage; limited to 40 visits (33 remaining)     Co-Pay: 10% co-insurance Home Health: 90% coverage; limited to 50 visits      Co-Pay: 10% co-insurance DME: 90% coverage     Co-Pay: 10% co-insurance Providers: in-network  SECONDARY:   Medicare A    Policy#: [id number]      Phone#: verified online via [employer] on 12/14/19  Financial Counselor:       Phone#:   The "Data Collection Information Summary" for patients in Inpatient Rehabilitation Facilities with attached "Privacy Act Volin Records" was provided and verbally reviewed with: N/A  Emergency Contact Information Contact Information    Name Relation Home Work Mobile   [place] Sister [phone]  [phone]   [place], [place] [phone]  [phone]     Current Medical History  Patient Admitting Diagnosis: closed comminuted intertrochanteric fx of left femur; s/p left hip IM nailing  History of Present Illness:74 year old right-handed male with history of epilepsy maintained on Keppra, rectal adenocarcinoma maintained on Xeloda receiving  radiation therapy followed by oncology services Dr.Feng as well as radiation oncology Dr. Lisbeth Renshaw, BPH, anxiety as well as history of mild cognitive disorder.   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  Mother has 24/7 assistance from personal care attendant.  Patient reportedly has good support of local family.  Presented 12/08/2019 to [facility] long hospital with reported breakthrough seizure after radiation treatment.  He was treated with Ativan IM and loaded with Keppra.  Cranial CT scan negative.  Admission chemistries potassium 3.0 glucose 136, hemoglobin 12.2 WBC 3.7, ammonia level 182 question false positive and repeated 24, lactic acid 1.9.  EEG with no seizure.  While patient was at [facility] long ED he did sustain another seizure and fell out of the bed without loss of consciousness landing on his left hip.  X-rays and imaging revealed left intertrochanteric femur fracture.  Patient underwent intramedullary fixation 12/11/2019 per Dr. Lyla Glassing.  Weightbearing as tolerated.  Maintained on aspirin 325 mg daily for DVT prophylaxis.  Acute blood loss anemia 8.4 and monitored.    Past Medical History  Past Medical History:  Diagnosis Date  . Anxiety   . Benign prostatic hyperplasia 03/30/2013   10/1 IMO update  . Cancer ([place])   . 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  Seizures ([place])     Family History  family history includes Brain cancer in his father; Dementia in his mother; Diabetes in his mother; Osteoporosis in his mother.  Prior Rehab/Hospitalizations:  Has the patient had prior rehab or hospitalizations prior to admission? Yes  Has the patient had major surgery during 100 days prior to admission? Yes  Current Medications   Current Facility-Administered Medications:  .  0.9 %  sodium chloride infusion, , Intravenous, Continuous, Dahal, Binaya, MD, Last Rate: 75 mL/hr at 12/14/19 0909, New Bag at 12/14/19 0909 .  aspirin EC tablet 325 mg, 325 mg, Oral,  Daily, Dahal, Binaya, MD .  Chlorhexidine Gluconate Cloth 2 % PADS 6 each, 6 each, Topical, Daily, Dahal, Binaya, MD, 6 each at 12/14/19 0910 .  docusate sodium (COLACE) capsule 100 mg, 100 mg, Oral, BID, Swinteck, Aaron Edelman, MD, 100 mg at 12/14/19 2147 .  HYDROcodone-acetaminophen (NORCO/VICODIN) 5-325 MG per tablet 1 tablet, 1 tablet, Oral, Q4H PRN, Rod Can, MD, 1 tablet at 12/11/19 2109 .  levETIRAcetam (KEPPRA) tablet 1,250 mg, 1,250 mg, Oral, BID, Swinteck, Aaron Edelman, MD, 1,250 mg at 12/14/19 2147 .  Lidocaine (Anorectal) SUPP 50 mg, 50 mg, Rectal, Q8H PRN, Swinteck, Aaron Edelman, MD .  loperamide (IMODIUM) capsule 2 mg, 2 mg, Oral, Q6H PRN, Dahal, Binaya, MD, 2 mg at 12/14/19 1354 .  melatonin tablet 3 mg, 3 mg, Oral, QHS PRN, Dahal, Binaya, MD, 3 mg at 12/14/19 2147 .  menthol-cetylpyridinium (CEPACOL) lozenge 3 mg, 1 lozenge, Oral, PRN **OR** phenol (CHLORASEPTIC) mouth spray 1 spray, 1 spray, Mouth/Throat, PRN, Swinteck, Aaron Edelman, MD .  methocarbamol (ROBAXIN) tablet 500 mg, 500 mg, Oral, Q6H PRN **OR** methocarbamol (ROBAXIN) 500 mg in dextrose 5 % 50 mL IVPB, 500 mg, Intravenous, Q6H PRN, Swinteck, Aaron Edelman, MD .  metoCLOPramide (REGLAN) tablet 5-10 mg, 5-10 mg, Oral, Q8H PRN **OR** metoCLOPramide (REGLAN) injection 5-10 mg, 5-10 mg, Intravenous, Q8H PRN, Swinteck, Aaron Edelman, MD .  ondansetron (ZOFRAN) tablet 4 mg, 4 mg, Oral, Q6H PRN, 4 mg at 12/13/19 1620 **OR** ondansetron (ZOFRAN) injection 4 mg, 4 mg, Intravenous, Q6H PRN, Swinteck, Aaron Edelman, MD .  pantoprazole (PROTONIX) EC tablet 40 mg, 40 mg, Oral, Daily, Dahal, Binaya, MD, 40 mg at 12/14/19 0909 .  tamsulosin (FLOMAX) capsule 0.4 mg, 0.4 mg, Oral, Daily, Dahal, Binaya, MD, 0.4 mg at 12/14/19 0910  Patients Current Diet:  Diet Order            Diet regular Room service appropriate? Yes; Fluid consistency: Thin  Diet effective now                 Precautions / Restrictions Precautions Precautions: Fall Restrictions Weight Bearing  Restrictions: Yes LUE Weight Bearing: Weight bearing as tolerated   Has the patient had 2 or more falls or a fall with injury in the past year?pt fell ~2 times resulting in injuries but sister not sure if falls occurred in 2019 or [address] at [place] ED with femur injury leading to this admission for rehab. Office of patient experience involved and in contact with sister, [name].   Prior Activity Level Community (5-7x/wk): 5x/week for medical tx. 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  Prior Functional Level Prior Function Level of Independence: Independent 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post office  Self Care: Did the patient need help bathing, dressing, using the toilet or eating?  Independent  Indoor Mobility: Did the patient need assistance with walking from room to room (with or without device)? Independent  Stairs: Did the patient need assistance with internal or external stairs (with or without device)? Independent  Functional Cognition: Did the patient need help planning regular tasks such as shopping or remembering to take medications? Needed some help  Home Assistive Devices / Equipment Home Assistive Devices/Equipment: Eyeglasses Home Equipment: Kasandra [employer] - single point (pt's mother utilizes all other DME owned)  Prior Device Use: Indicate devices/aids used by the patient prior to current illness, exacerbation or injury? None of the above  Current Functional Level Cognition  Overall Cognitive Status: Impaired/Different from baseline Current Attention Level: Sustained Orientation Level: Oriented X4 Following Commands: Follows one step commands inconsistently, Follows one step commands with increased time Safety/Judgement: Decreased awareness of safety, Decreased awareness of deficits General Comments: Perseverating on abdominal pain and need to have a BM. Nursing assisted pt to/from [facility] just prior to session and PT assisted to/from [facility]  during session. Unsuccessful both attempts.    Extremity Assessment (includes Sensation/Coordination)  Upper Extremity Assessment: Generalized weakness  Lower Extremity Assessment: Defer to PT evaluation LLE Deficits / Details: L hip flexion 3-/5, knee flexion/extension 3-/5, ankle PF/DF 4/5, pain limiting    ADLs  Overall ADL's : Needs assistance/impaired Eating/Feeding: Set up, Sitting Grooming: Min guard, Sitting Upper Body Bathing: Minimal assistance, Sitting Lower Body Bathing: Maximal assistance, Sit to/from stand, Sitting/lateral leans Upper Body Dressing : Minimal assistance, Sitting Lower Body Dressing: Maximal assistance, Sit to/from stand Toileting- Clothing Manipulation and Hygiene: Total assistance, Sitting/lateral lean, Sit to/from stand Functional mobility during ADLs: Maximal assistance (sit<>stand at [facility])    Mobility  Overal bed mobility: Needs Assistance Bed Mobility: Supine to Sit, Sit to Supine Supine to sit: HOB elevated, Max assist Sit to supine: Mod assist General bed mobility comments: Pt received sitting EOB.    Transfers  Overall transfer level: Needs assistance Equipment used: Rolling walker (2 wheeled) Transfer via Lift Equipment: Stedy Transfers: Sit to/from Stand, [employer] Transfers Sit to Stand: Mod assist Stand pivot transfers: Mod assist, +2 safety/equipment General transfer comment: cues for hand placement, assist to power up and stabilize balance    Ambulation / Gait / Stairs / Wheelchair Mobility  Ambulation/Gait Ambulation/Gait assistance: Mod assist, +2 safety/equipment Gait Distance (Feet): 15 Feet Assistive device: Rolling walker (2 wheeled) Gait Pattern/deviations: Step-to pattern, Decreased weight shift to left, Decreased stance time - left General Gait Details: cues for hand placement and sequencing, assist to maintain balance and manage RW Gait velocity: decreased Gait velocity interpretation: <1.31 ft/sec, indicative of  household ambulator    Posture / Balance Dynamic Sitting Balance Sitting balance - Comments: static sit EOB without support Balance Overall balance assessment: Needs assistance Sitting-balance support: Feet supported, Single extremity supported Sitting balance-Leahy Scale: Fair Sitting balance - Comments: static sit EOB without support Standing balance support: Bilateral upper extremity supported, During functional activity Standing balance-Leahy Scale: Poor Standing balance comment: external assist and UE support     Special needs/care consideration Skin Avulsion: buttocks; Blister: buttocks; Pressure injury: R buttocks stage 2; Surgical incision: Left leg and Designated visitor Quantay Zaremba, sister; [name], sister  Completed chemo and radiation for rectal cancer. Not to receive last radiation treatments of 2 due to femur fracture. Planned cancer surgery at [facility] in December.  Patient fell off stretcher in [facility] ED with femur injury. Office of patient experience involved and in contact with sister, [name]. Patient's brother in [place] unexpectedly died this past weekend. He was sibling local who would transport him to work and to all treatments.  Funeral upcoming weekend.   Previous Home Environment  Living Arrangements: Parent (mother)  Lives With: Spouse (caregivers/family present for 24 hour care) Available Help at Discharge: Personal care attendant (near 24/7 assistance from staff per pt and sister) Type of Home: House Home Layout: Two level Alternate Level Stairs-Rails: Right, Left Alternate Level Stairs-Number of Steps: 1 flight Home Access: Stairs to enter, Ramped entrance Entrance Stairs-Rails: Right Entrance Stairs-Number of Steps: 3 Bathroom Shower/Tub: Chiropodist: Standard Bathroom Accessibility: Yes How Accessible: Accessible via walker [place]: Other (Comment) (caregivers make sure he gets food and medicines.)  Discharge  Living Setting Plans for Discharge Living Setting: Patient's home Type of Home at Discharge: House Discharge Home Layout: Two level Alternate Level Stairs-Rails: Right Alternate Level Stairs-Number of Steps: 13-16 Discharge Home Access: Stairs to enter, Ramped entrance Entrance Stairs-Rails: None Entrance Stairs-Number of Steps: 2-3 Discharge Bathroom Shower/Tub: Tub/shower unit Discharge Bathroom Toilet: Standard Discharge Bathroom Accessibility: Yes How Accessible: Accessible via walker Does the patient have any problems obtaining your medications?: No  Social/Family/Support Systems Patient Roles: Spouse Anticipated Caregiver: Lyanne Co, sister; [name], sister Anticipated Caregiver's Contact Information: [name]: [name]: [phone] Caregiver Availability: Other (Comment) (caregivers 4 days/week; family on weekends) Discharge Plan Discussed with Primary Caregiver: Yes Is Caregiver In Agreement with Plan?: Yes Does Caregiver/Family have Issues with Lodging/Transportation while Pt is in Rehab?: No  Patient lives with his 25 year old mother and she has caregivers supports hired and family on the weekends.[name], sister, will need recommendations for patient's care so that she can arrange prior to d/c.  Goals Patient/Family Goal for Rehab: supervision: PT/OT/ST Expected length of stay: 10-14 days Pt/Family Agrees to Admission and willing to participate: Yes Program Orientation Provided & Reviewed with Pt/Caregiver Including Roles  & Responsibilities: Yes  Decrease burden of Care through IP rehab admission: NA  Possible need for SNF placement upon discharge: NA  Patient Condition: This patient's medical and functional status has changed since the consult dated: 12/12/19 in which the Rehabilitation Physician determined and documented that the patient's condition is appropriate for intensive rehabilitative care in an inpatient rehabilitation facility. See "History of  Present Illness" (above) for medical update. Functional changes are: Pt is 44' Mod A +2 with mobility and Min A-Max A with ADLs. Patient's medical and functional status update has been discussed with the Rehabilitation physician and patient remains appropriate for inpatient rehabilitation. Will admit to inpatient rehab today.  Preadmission Screen Completed By:  Cleatrice Burke, RN, 12/15/2019 11:04 AM ______________________________________________________________________   Discussed status with Dr. Dagoberto Ligas on 12/15/2019 at 1105 and received approval for admission today.  Admission Coordinator:  Cleatrice Burke, time 1700 Date 12/15/2019

## 2019-12-13 NOTE — Progress Notes (Signed)
   Subjective: 2 Days Post-Op Procedure(s) (LRB): INTRAMEDULLARY (IM) NAIL INTERTROCHANTRIC (Left) Patient reports pain as mild.   Patient seen in rounds for Dr. Lyla Glassing . Patient is well, and has had no acute complaints or problems other than discomfort in the left hip. He is frustrated that he is not able to ambulate to the commode. Otherwise, he states he is doing well. Foley in place. Denies CP, SHOB, N/V.  We will continue therapy today.   Objective: Vital signs in last 24 hours: Temp:  [97.6 F (36.4 C)-99.1 F (37.3 C)] 98.5 F (36.9 C) (11/07 0911) Pulse Rate:  [84-109] 84 (11/07 0911) Resp:  [18-19] 18 (11/07 0911) BP: (117-137)/(71-85) 117/71 (11/07 0911) SpO2:  [96 %-100 %] 100 % (11/07 0911) Weight:  [60.8 kg] 60.8 kg (11/07 0457)  Intake/Output from previous day:  Intake/Output Summary (Last 24 hours) at 12/13/2019 1019 Last data filed at 12/12/2019 2000 Gross per 24 hour  Intake --  Output 300 ml  Net -300 ml     Intake/Output this shift: No intake/output data recorded.  Labs: Recent Labs    12/11/19 1424 12/12/19 0157 12/13/19 0424  HGB 10.1* 9.6* 9.3*   Recent Labs    12/12/19 0157 12/13/19 0424  WBC 7.9 5.9  RBC 3.12* 2.98*  HCT 29.3* 27.6*  PLT 113* 148*   Recent Labs    12/12/19 0157 12/13/19 0424  NA 137 137  K 3.5 3.2*  CL 101 101  CO2 27 25  BUN 10 13  CREATININE 0.68 0.59*  GLUCOSE 111* 109*  CALCIUM 8.5* 8.3*   No results for input(s): LABPT, INR in the last 72 hours.  Exam: General - Patient is Alert and Appropriate Extremity - Neurologically intact Intact pulses distally Dorsiflexion/Plantar flexion intact Incision: dressing C/D/I Dressing - dressing C/D/I Motor Function - intact, moving foot and toes well on exam.   Past Medical History:  Diagnosis Date  . Anxiety   . Benign prostatic hyperplasia 03/30/2013   10/1 IMO update  . Cancer ([place])   . 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  Seizures ([facility])     Assessment/Plan: 2 Days Post-Op Procedure(s) (LRB): INTRAMEDULLARY (IM) NAIL INTERTROCHANTRIC (Left) Principal Problem:   Breakthrough seizure ([place]) Active Problems:   Closed comminuted intertrochanteric fracture of left femur ([facility])   Pressure injury of skin  Estimated body mass index is 20.99 kg/m as calculated from the following:   Height as of this encounter: 5\' 7"  (1.702 m).   Weight as of this encounter: 60.8 kg. Advance diet Up with therapy  DVT Prophylaxis - Aspirin Weight bearing as tolerated.  Hemoglobin stable at 9.3 this AM.   PT recommends CIR at discharge. Plan for continued PT today to work on safe ambulation and mobility. Discharge to the appropriate venue per medicine team when medically stable. Dressing to remain in place until follow up.   Griffith Citron, PA-C Orthopedic Surgery [phone] 12/13/2019, 10:19 AM

## 2019-12-14 DIAGNOSIS — E872 Acidosis: Secondary | ICD-10-CM | POA: Diagnosis not present

## 2019-12-14 LAB — CBC
HCT: 26.6 % — ABNORMAL LOW (ref 39.0–52.0)
Hemoglobin: 8.9 g/dL — ABNORMAL LOW (ref 13.0–17.0)
MCH: 31 pg (ref 26.0–34.0)
MCHC: 33.5 g/dL (ref 30.0–36.0)
MCV: 92.7 fL (ref 80.0–100.0)
Platelets: 161 10*3/uL (ref 150–400)
RBC: 2.87 MIL/uL — ABNORMAL LOW (ref 4.22–5.81)
RDW: 16 % — ABNORMAL HIGH (ref 11.5–15.5)
WBC: 6.3 10*3/uL (ref 4.0–10.5)
nRBC: 0 % (ref 0.0–0.2)

## 2019-12-14 MED ORDER — ASPIRIN EC 325 MG PO TBEC
325.0000 mg | DELAYED_RELEASE_TABLET | Freq: Every day | ORAL | Status: DC
  Administered 2019-12-15: 325 mg via ORAL
  Filled 2019-12-14: qty 1

## 2019-12-14 MED ORDER — TAMSULOSIN HCL 0.4 MG PO CAPS
0.4000 mg | ORAL_CAPSULE | Freq: Every day | ORAL | Status: DC
  Administered 2019-12-14 – 2019-12-15 (×2): 0.4 mg via ORAL
  Filled 2019-12-14 (×2): qty 1

## 2019-12-14 MED ORDER — SODIUM CHLORIDE 0.9 % IV SOLN: INTRAVENOUS | Status: DC

## 2019-12-14 MED ORDER — PANTOPRAZOLE SODIUM 40 MG PO TBEC
40.0000 mg | DELAYED_RELEASE_TABLET | Freq: Every day | ORAL | Status: DC
  Administered 2019-12-14 – 2019-12-15 (×2): 40 mg via ORAL
  Filled 2019-12-14 (×2): qty 1

## 2019-12-14 MED ORDER — LOPERAMIDE HCL 2 MG PO CAPS
2.0000 mg | ORAL_CAPSULE | Freq: Four times a day (QID) | ORAL | Status: DC | PRN
  Administered 2019-12-14: 2 mg via ORAL
  Filled 2019-12-14: qty 1

## 2019-12-14 NOTE — Progress Notes (Signed)
Inpatient Rehab Admissions Coordinator:  Insurance authorization received.  [name] and Lyanne Co made aware.  CIR admission pending bed availability.  Gayland Curry, [place], [place] Admissions Coordinator [phone]

## 2019-12-14 NOTE — Progress Notes (Signed)
Physical Therapy Treatment Patient Details Name: Carlos [name] MRN: [id number] DOB: [birth year] Today's Date: 12/14/2019    History of Present Illness 74 y.o. 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 Patient presented to the [facility] ED on 11/2 via EMS for a breakthrough seizure. Patient had radiation treatment on the day of admission. Family stated he took a nap after the treatment and was difficult to wake up. They also noticed blood in his mouth. EMS brought him to ED. Pt found to have generalized tonic-clonic seizure in ED. Pt fell off bed at [facility] long and broke L hip. Pt underwent L hip IM nailing on 12/11/2019.    PT Comments    Pt required mod assist transfers and mod assist +2 safety ambulation 15' with RW. Session limited by pt's perseveration on abdominal pain. Pt assisted to/from [facility] by nursing just prior to PT session. PT also assisted pt to/from [employer] without results (no BM). Pt in recliner at end of session with feet elevated.   Follow Up Recommendations  CIR     Equipment Recommendations  Rolling walker with 5" wheels;Wheelchair (measurements PT)    Recommendations for Other Services       Precautions / Restrictions Precautions Precautions: Fall Restrictions LUE Weight Bearing: Weight bearing as tolerated    Mobility  Bed Mobility               General bed mobility comments: Pt received sitting EOB.  Transfers Overall transfer level: Needs assistance Equipment used: Rolling walker (2 wheeled) Transfers: Sit to/from [facility] Sit to Stand: Mod assist Stand pivot transfers: Mod assist;+2 safety/equipment       General transfer comment: cues for hand placement, assist to power up and stabilize balance  Ambulation/Gait Ambulation/Gait assistance: Mod assist;+2 safety/equipment Gait Distance (Feet): 15 Feet Assistive  device: Rolling walker (2 wheeled) Gait Pattern/deviations: Step-to pattern;Decreased weight shift to left;Decreased stance time - left Gait velocity: decreased Gait velocity interpretation: <1.31 ft/sec, indicative of household ambulator General Gait Details: cues for hand placement and sequencing, assist to maintain balance and manage RW   Stairs             Wheelchair Mobility    Modified Rankin (Stroke Patients Only)       Balance Overall balance assessment: Needs assistance Sitting-balance support: Feet supported;Single extremity supported Sitting balance-Leahy Scale: Fair Sitting balance - Comments: static sit EOB without support   Standing balance support: Bilateral upper extremity supported;During functional activity Standing balance-Leahy Scale: Poor Standing balance comment: external assist and UE support                             Cognition Arousal/Alertness: Awake/alert Behavior During Therapy: WFL for tasks assessed/performed Overall Cognitive Status: Impaired/Different from baseline Area of Impairment: Attention;Memory;Following commands;Safety/judgement;Awareness;Problem solving                   Current Attention Level: Sustained Memory: Decreased recall of precautions;Decreased short-term memory Following Commands: Follows one step commands inconsistently;Follows one step commands with increased time Safety/Judgement: Decreased awareness of safety;Decreased awareness of deficits Awareness: Emergent Problem Solving: Slow processing;Difficulty sequencing;Requires verbal cues;Requires tactile cues General Comments: Perseverating on abdominal pain and need to have a BM. Nursing assisted pt to/from [facility] just prior to session and PT assisted to/from [facility] during session. Unsuccessful both attempts.  Exercises      General Comments General comments (skin integrity, edema, etc.): VSS on RA      Pertinent Vitals/Pain Pain  Assessment: Faces Faces Pain Scale: Hurts even more Pain Location: abdomen Pain Descriptors / Indicators: Grimacing;Discomfort Pain Intervention(s): Monitored during session;Limited activity within patient's tolerance;Repositioned    Home Living                      Prior Function            PT Goals (current goals can now be found in the care plan section) Acute Rehab PT Goals Patient Stated Goal: home Progress towards PT goals: Progressing toward goals    Frequency    Min 5X/week      PT Plan Current plan remains appropriate    Co-evaluation              AM-PAC PT "6 Clicks" Mobility   Outcome Measure  Help needed turning from your back to your side while in a flat bed without using bedrails?: A Little Help needed moving from lying on your back to sitting on the side of a flat bed without using bedrails?: A Lot Help needed moving to and from a bed to a chair (including a wheelchair)?: A Lot Help needed standing up from a chair using your arms (e.g., wheelchair or bedside chair)?: A Little Help needed to walk in hospital room?: A Lot Help needed climbing 3-5 steps with a railing? : Total 6 Click Score: 13    End of Session Equipment Utilized During Treatment: Gait belt Activity Tolerance: Patient tolerated treatment well Patient left: in chair;with call bell/phone within reach;with chair alarm set Nurse Communication: Mobility status PT Visit Diagnosis: Unsteadiness on feet (R26.81);Other abnormalities of gait and mobility (R26.89);Muscle weakness (generalized) (M62.81);Pain     Time: 3202-3343 PT Time Calculation (min) (ACUTE ONLY): 15 min  Charges:  $Gait Training: 8-22 mins                     Lorrin Goodell, [place]  Office # [phone] Pager [phone]    Lorriane Shire 12/14/2019, 12:23 PM

## 2019-12-14 NOTE — Progress Notes (Signed)
PROGRESS NOTE  Carlos [name]  DOB: 26-Aug-1945  PCP: [name], MD WRU:[id number]  DOA: 12/08/2019  LOS: 4 days   Chief Complaint  Patient presents with   Seizures   Brief narrative: Patient is a 74 y.o. 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 Patient presented to the [facility] ED on 11/2 via EMS for a breakthrough seizure. Patient had radiation treatment on the day of admission. Family stated he took a nap after the treatment and was difficult to wake up. They also noticed blood in his mouth. EMS brought him to ED.  In the ED, patient was hemodynamically stable but he had seizures while in the ED.  He was started on Keppra. Unfortunately, during one of the seizure episodes, patient fell out of his bed and landed on his left hip.  Left hip x-ray showed comminuted, angulated left intratrochanteric hip fracture with avulsion of the lesser trochanter.  Neurology and orthopedic consultation were obtained.   Patient was admitted to hospitalist service at [facility]. 11/5, patient underwent intramedullary fixation of left femur fracture Currently waiting for placement  Subjective: Patient was seen and examined this morning.  He can answer orientation questions but is inconsistent on the detailed questions.  He then says his stomach is upset and he has for last several days and he came to the doctor's office for the same.  Per RN, patient has not had any diarrhea.  Assessment/Plan: Breakthrough seizures History of epilepsy -Unclear trigger of breakthrough seizures. -EEG showed bitemporal spikes consistent with patient known history of seizures.  No seizures were seen throughout the recording. -Postictal status is resolved. Neurology consult appreciated. -CT scan of head and MRI brain did not show any evidence of intracranial abnormality. -Neurology  increased Keppra from 1000 mg twice daily to 1250 mg twice daily. -Outpatient neurology follow-up. -Seizure precautions at discharge.  Left Hip Fracture -While having seizure in the ED, patient fell off his bed on his left side.   -X-ray of left hip showed comminuted, angulated left intratrochanteric hip fracture with avulsion of the lesser trochanter.  -Orthopedic consult appreciated.  -11/5 underwent intramedullary fixation of left femur fracture. -Overall Norco as needed for pain control. -Working with physical therapy.  CIR recommended. -Switched to aspirin for DVT prophylaxis this morning.  Also ordered Protonix  Acute delirium History of mild cognitive disorder -While in the hospital, he is having episodes of confusion but not agitated or restless.  Confusion likely multifactorial -hospitalization, fracture, seizure, pain medicines -Not restless or agitated.  Easily reorientable. -Fall precautions. -Melatonin as needed at bedtime -Continue to monitor for temperature, WBC count  Postop blood loss drop in hemoglobin -Baseline hemoglobin more than 12.  Hemoglobin postop is mostly between 9-10. -No active blood loss.  Continue to monitor. Recent Labs    09/11/19 0953 09/12/19 0546 09/12/19 1626 09/13/19 0555 12/09/19 0408 12/09/19 0408 12/11/19 1424 12/11/19 1424 12/12/19 0157 12/12/19 0157 12/13/19 0424 12/14/19 0149  HGB   < >  --  12.5*   < > 10.8*  --  10.1*  --  9.6*  --  9.3* 8.9*  MCV   < >  --  92.2   < > 94.8   < > 93.6   < > 93.9   < > 92.6 92.7  VITAMINB12  --  759  --   --   --   --   --   --   --   --   --   --  FOLATE  --  76.9  --   --   --   --   --   --   --   --   --   --   FERRITIN  --  141  --   --   --   --   --   --   --   --   --   --   TIBC  --  404  --   --   --   --   --   --   --   --   --   --   IRON  --  126  --   --   --   --   --   --   --   --   --   --   RETICCTPCT  --   --  1.2  --   --   --   --   --   --   --   --   --    < > =  values in this interval not displayed.   Hypokalemia -Potassium level was low yesterday.  Replaced.  Repeat tomorrow. Recent Labs  Lab 12/08/19 1708 12/08/19 1753 12/09/19 0408 12/12/19 0157 12/13/19 0424  K 3.0*  --  4.0 3.5 3.2*  MG  --  2.6* 2.2  --   --    Hyperammonemia -Initially 182, and then 24 -Unclear significance of the first elevated value, suspect an error. Recent Labs  Lab 12/08/19 1708 12/08/19 2014  AMMONIA 182* 24   Rectal Adenocarcinoma -At home on antineoplastic agent with capecitabine 1500 g p.o. twice daily and continue with lidocaine suppository 50 mg rectally every 8 hours as needed for pain -Continue to monitor -Oncology follow-up appreciated.  Mobility: PT eval after procedure Code Status:   Code Status: Full Code  Nutritional status: Body mass index is 20.83 kg/m.     Diet Order            Diet regular Room service appropriate? Yes; Fluid consistency: Thin  Diet effective now                 DVT prophylaxis: SCDs Start: 12/11/19 1347   Antimicrobials:  None Fluid: None Consultants: Orthopedics, neurology Family Communication: : Family getting regular update.    Status is: Inpatient  Remains inpatient appropriate because: Pending CIR placement  Dispo: The patient is from: Home              Anticipated d/c is to: CIR              Anticipated d/c date is: Whenever bed/authorization available              Patient currently is medically stable to d/c.   Infusions:   sodium chloride 75 mL/hr at 12/14/19 6734   methocarbamol (ROBAXIN) IV      Scheduled Meds:  [START ON 12/15/2019] aspirin EC  325 mg Oral Daily   Chlorhexidine Gluconate Cloth  6 each Topical Daily   docusate sodium  100 mg Oral BID   levETIRAcetam  1,250 mg Oral BID   pantoprazole  40 mg Oral Daily   tamsulosin  0.4 mg Oral Daily    Antimicrobials: Anti-infectives (From admission, onward)   Start     Dose/Rate Route Frequency Ordered Stop    12/11/19 1445  ceFAZolin (ANCEF) IVPB 2g/100 mL premix        2 g 200 mL/hr over  30 Minutes Intravenous Every 6 hours 12/11/19 1346 12/11/19 2143   12/11/19 0730  ceFAZolin (ANCEF) IVPB 2g/100 mL premix        2 g 200 mL/hr over 30 Minutes Intravenous On call to O.R. 12/11/19 0719 12/11/19 0737   12/11/19 0712  ceFAZolin (ANCEF) 2-4 GM/100ML-% IVPB       Note to Pharmacy: Imag[employer]hes   : cabinet override      12/11/19 0712 12/11/19 0750      PRN meds: HYDROcodone-acetaminophen, Lidocaine (Anorectal), melatonin, menthol-cetylpyridinium **OR** phenol, methocarbamol **OR** methocarbamol (ROBAXIN) IV, metoCLOPramide **OR** metoCLOPramide (REGLAN) injection, ondansetron **OR** ondansetron (ZOFRAN) IV   Objective: Vitals:   12/14/19 0500 12/14/19 0813  BP: 122/65 125/72  Pulse: 78 99  Resp: 18 18  Temp: 98.3 F (36.8 C) 98.1 F (36.7 C)  SpO2: 100% 100%    Intake/Output Summary (Last 24 hours) at 12/14/2019 1105 Last data filed at 12/14/2019 0900 Gross per 24 hour  Intake 140 ml  Output 700 ml  Net -560 ml   Filed Weights   12/11/19 0400 12/13/19 0457 12/14/19 0500  Weight: 61.7 kg 60.8 kg 60.3 kg   Weight change: -0.454 kg Body mass index is 20.83 kg/m.   Physical Exam: General exam: Appears calm and comfortable. Not in physical distress Skin: No rashes, lesions or ulcers. HEENT: Atraumatic, normocephalic, supple neck, no obvious bleeding Lungs: Clear to auscultation bilaterally CVS: Regular rate and rhythm, no murmur GI/Abd soft, nontender, nondistended, bowel sound present CNS: Answers orientation questions but gets confused on all the details.   Psychiatry: Mood appropriate Extremities: No pedal edema, no calf tenderness Data Review: I have personally reviewed the laboratory data and studies available.  Recent Labs  Lab 12/08/19 1708 12/08/19 1708 12/09/19 0408 12/11/19 1424 12/12/19 0157 12/13/19 0424 12/14/19 0149  WBC 5.8   < > 8.1 8.4 7.9 5.9 6.3    NEUTROABS 4.2  --  7.0  --   --   --   --   HGB 13.3   < > 10.8* 10.1* 9.6* 9.3* 8.9*  HCT 43.0   < > 32.9* 30.5* 29.3* 27.6* 26.6*  MCV 99.8   < > 94.8 93.6 93.9 92.6 92.7  PLT 229   < > 161 110* 113* 148* 161   < > = values in this interval not displayed.   Recent Labs  Lab 12/08/19 1708 12/08/19 1753 12/09/19 0408 12/11/19 1424 12/12/19 0157 12/13/19 0424  NA 145  --  138  --  137 137  K 3.0*  --  4.0  --  3.5 3.2*  CL 102  --  108  --  101 101  CO2 17*  --  23  --  27 25  GLUCOSE 136*  --  135*  --  111* 109*  BUN 13  --  14  --  10 13  CREATININE 0.92  --  0.70 0.66 0.68 0.59*  CALCIUM 10.0  --  8.5*  --  8.5* 8.3*  MG  --  2.6* 2.2  --   --   --     F/u labs ordered.  Signed, Terrilee Croak, MD [facility] 12/14/2019

## 2019-12-15 ENCOUNTER — Inpatient Hospital Stay (HOSPITAL_COMMUNITY)
Admission: RE | Admit: 2019-12-15 | Discharge: 2019-12-30 | DRG: 559 | Disposition: A | Discharge status: Skilled Nursing Facility | Payer: 59 | Source: Intra-hospital | Attending: Physical Medicine & Rehabilitation | Admitting: Physical Medicine & Rehabilitation

## 2019-12-15 ENCOUNTER — Ambulatory Visit: Payer: 59 | Admitting: Speech Pathology

## 2019-12-15 ENCOUNTER — Other Ambulatory Visit: Payer: Self-pay

## 2019-12-15 ENCOUNTER — Encounter (HOSPITAL_COMMUNITY): Payer: Self-pay | Admitting: Physical Medicine & Rehabilitation

## 2019-12-15 DIAGNOSIS — E43 Unspecified severe protein-calorie malnutrition: Secondary | ICD-10-CM | POA: Diagnosis present

## 2019-12-15 DIAGNOSIS — Z20822 Contact with and (suspected) exposure to covid-19: Secondary | ICD-10-CM | POA: Diagnosis present

## 2019-12-15 DIAGNOSIS — R195 Other fecal abnormalities: Secondary | ICD-10-CM

## 2019-12-15 DIAGNOSIS — L8992 Pressure ulcer of unspecified site, stage 2: Secondary | ICD-10-CM | POA: Diagnosis present

## 2019-12-15 DIAGNOSIS — G40909 Epilepsy, unspecified, not intractable, without status epilepticus: Secondary | ICD-10-CM | POA: Diagnosis present

## 2019-12-15 DIAGNOSIS — Z7409 Other reduced mobility: Secondary | ICD-10-CM | POA: Diagnosis not present

## 2019-12-15 DIAGNOSIS — S72145A Nondisplaced intertrochanteric fracture of left femur, initial encounter for closed fracture: Secondary | ICD-10-CM

## 2019-12-15 DIAGNOSIS — G8918 Other acute postprocedural pain: Secondary | ICD-10-CM | POA: Diagnosis present

## 2019-12-15 DIAGNOSIS — Z923 Personal history of irradiation: Secondary | ICD-10-CM

## 2019-12-15 DIAGNOSIS — L89322 Pressure ulcer of left buttock, stage 2: Secondary | ICD-10-CM | POA: Diagnosis present

## 2019-12-15 DIAGNOSIS — N400 Enlarged prostate without lower urinary tract symptoms: Secondary | ICD-10-CM | POA: Diagnosis present

## 2019-12-15 DIAGNOSIS — E876 Hypokalemia: Secondary | ICD-10-CM | POA: Diagnosis present

## 2019-12-15 DIAGNOSIS — S72145D Nondisplaced intertrochanteric fracture of left femur, subsequent encounter for closed fracture with routine healing: Principal | ICD-10-CM

## 2019-12-15 DIAGNOSIS — D62 Acute posthemorrhagic anemia: Secondary | ICD-10-CM | POA: Diagnosis present

## 2019-12-15 DIAGNOSIS — F419 Anxiety disorder, unspecified: Secondary | ICD-10-CM | POA: Diagnosis present

## 2019-12-15 DIAGNOSIS — S72145S Nondisplaced intertrochanteric fracture of left femur, sequela: Secondary | ICD-10-CM | POA: Diagnosis not present

## 2019-12-15 DIAGNOSIS — Z85048 Personal history of other malignant neoplasm of rectum, rectosigmoid junction, and anus: Secondary | ICD-10-CM | POA: Diagnosis not present

## 2019-12-15 DIAGNOSIS — M7989 Other specified soft tissue disorders: Secondary | ICD-10-CM | POA: Diagnosis not present

## 2019-12-15 DIAGNOSIS — Z681 Body mass index (BMI) 19 or less, adult: Secondary | ICD-10-CM | POA: Diagnosis not present

## 2019-12-15 DIAGNOSIS — R569 Unspecified convulsions: Secondary | ICD-10-CM

## 2019-12-15 DIAGNOSIS — E8809 Other disorders of plasma-protein metabolism, not elsewhere classified: Secondary | ICD-10-CM | POA: Diagnosis present

## 2019-12-15 DIAGNOSIS — Z79899 Other long term (current) drug therapy: Secondary | ICD-10-CM

## 2019-12-15 DIAGNOSIS — L89312 Pressure ulcer of right buttock, stage 2: Secondary | ICD-10-CM | POA: Diagnosis present

## 2019-12-15 DIAGNOSIS — Z741 Need for assistance with personal care: Secondary | ICD-10-CM | POA: Diagnosis not present

## 2019-12-15 DIAGNOSIS — G3184 Mild cognitive impairment, so stated: Secondary | ICD-10-CM | POA: Diagnosis present

## 2019-12-15 DIAGNOSIS — E872 Acidosis: Secondary | ICD-10-CM | POA: Diagnosis not present

## 2019-12-15 DIAGNOSIS — R32 Unspecified urinary incontinence: Secondary | ICD-10-CM | POA: Diagnosis present

## 2019-12-15 DIAGNOSIS — M25552 Pain in left hip: Secondary | ICD-10-CM | POA: Diagnosis not present

## 2019-12-15 DIAGNOSIS — W06XXXD Fall from bed, subsequent encounter: Secondary | ICD-10-CM | POA: Diagnosis present

## 2019-12-15 DIAGNOSIS — E46 Unspecified protein-calorie malnutrition: Secondary | ICD-10-CM

## 2019-12-15 DIAGNOSIS — R8290 Unspecified abnormal findings in urine: Secondary | ICD-10-CM

## 2019-12-15 DIAGNOSIS — C20 Malignant neoplasm of rectum: Secondary | ICD-10-CM | POA: Diagnosis present

## 2019-12-15 DIAGNOSIS — Z7982 Long term (current) use of aspirin: Secondary | ICD-10-CM

## 2019-12-15 LAB — BASIC METABOLIC PANEL
Anion gap: 9 (ref 5–15)
BUN: 9 mg/dL (ref 8–23)
CO2: 25 mmol/L (ref 22–32)
Calcium: 8.2 mg/dL — ABNORMAL LOW (ref 8.9–10.3)
Chloride: 105 mmol/L (ref 98–111)
Creatinine, Ser: 0.59 mg/dL — ABNORMAL LOW (ref 0.61–1.24)
GFR, Estimated: >60 mL/min (ref >60)
Glucose, Bld: 104 mg/dL — ABNORMAL HIGH (ref 70–99)
Potassium: 3.2 mmol/L — ABNORMAL LOW (ref 3.5–5.1)
Sodium: 139 mmol/L (ref 135–145)

## 2019-12-15 LAB — CBC WITH DIFFERENTIAL/PLATELET
Abs Immature Granulocytes: 0.11 10*3/uL — ABNORMAL HIGH (ref 0.00–0.07)
Basophils Absolute: 0 10*3/uL (ref 0.0–0.1)
Basophils Relative: 0 %
Eosinophils Absolute: 0.1 10*3/uL (ref 0.0–0.5)
Eosinophils Relative: 3 %
HCT: 25.6 % — ABNORMAL LOW (ref 39.0–52.0)
Hemoglobin: 8.4 g/dL — ABNORMAL LOW (ref 13.0–17.0)
Immature Granulocytes: 2 %
Lymphocytes Relative: 8 %
Lymphs Abs: 0.4 10*3/uL — ABNORMAL LOW (ref 0.7–4.0)
MCH: 31.3 pg (ref 26.0–34.0)
MCHC: 32.8 g/dL (ref 30.0–36.0)
MCV: 95.5 fL (ref 80.0–100.0)
Monocytes Absolute: 0.7 10*3/uL (ref 0.1–1.0)
Monocytes Relative: 14 %
Neutro Abs: 3.4 10*3/uL (ref 1.7–7.7)
Neutrophils Relative %: 73 %
Platelets: 153 10*3/uL (ref 150–400)
RBC: 2.68 MIL/uL — ABNORMAL LOW (ref 4.22–5.81)
RDW: 16.1 % — ABNORMAL HIGH (ref 11.5–15.5)
WBC: 4.7 10*3/uL (ref 4.0–10.5)
nRBC: 0 % (ref 0.0–0.2)

## 2019-12-15 LAB — LEVETIRACETAM LEVEL: Levetiracetam Lvl: <1 ug/mL — ABNORMAL LOW (ref 10.0–40.0)

## 2019-12-15 MED ORDER — MELATONIN 3 MG PO TABS
3.0000 mg | ORAL_TABLET | Freq: Every evening | ORAL | Status: DC | PRN
  Administered 2019-12-29 (×2): 3 mg via ORAL
  Filled 2019-12-15 (×2): qty 1

## 2019-12-15 MED ORDER — METHOCARBAMOL 500 MG PO TABS: 500.0000 mg | ORAL_TABLET | Freq: Four times a day (QID) | ORAL | Status: DC | PRN

## 2019-12-15 MED ORDER — LEVETIRACETAM 500 MG PO TABS
1250.0000 mg | ORAL_TABLET | Freq: Two times a day (BID) | ORAL | Status: DC
  Administered 2019-12-15 – 2019-12-30 (×30): 1250 mg via ORAL
  Filled 2019-12-15 (×30): qty 2

## 2019-12-15 MED ORDER — HYDROCODONE-ACETAMINOPHEN 5-325 MG PO TABS
1.0000 | ORAL_TABLET | ORAL | 0 refills | Status: DC | PRN
Start: 2019-12-15 — End: 2019-12-15

## 2019-12-15 MED ORDER — ASPIRIN EC 81 MG PO TBEC
81.0000 mg | DELAYED_RELEASE_TABLET | Freq: Two times a day (BID) | ORAL | Status: DC
  Administered 2019-12-16 – 2019-12-30 (×29): 81 mg via ORAL
  Filled 2019-12-15 (×29): qty 1

## 2019-12-15 MED ORDER — MELATONIN 3 MG PO TABS
3.0000 mg | ORAL_TABLET | Freq: Every evening | ORAL | 0 refills | Status: DC | PRN
Start: 2019-12-15 — End: 2020-06-06

## 2019-12-15 MED ORDER — ONDANSETRON HCL 4 MG PO TABS
4.0000 mg | ORAL_TABLET | Freq: Four times a day (QID) | ORAL | Status: DC | PRN
  Administered 2019-12-19: 4 mg via ORAL
  Filled 2019-12-15 (×2): qty 1

## 2019-12-15 MED ORDER — LOPERAMIDE HCL 2 MG PO CAPS: 2.0000 mg | ORAL_CAPSULE | Freq: Four times a day (QID) | ORAL | 0 refills | Status: DC | PRN

## 2019-12-15 MED ORDER — ONDANSETRON HCL 4 MG PO TABS: 4.0000 mg | ORAL_TABLET | Freq: Four times a day (QID) | ORAL | 0 refills | Status: DC | PRN

## 2019-12-15 MED ORDER — DOCUSATE SODIUM 100 MG PO CAPS
100.0000 mg | ORAL_CAPSULE | Freq: Two times a day (BID) | ORAL | Status: DC
  Administered 2019-12-15 – 2019-12-25 (×20): 100 mg via ORAL
  Filled 2019-12-15 (×21): qty 1

## 2019-12-15 MED ORDER — METOCLOPRAMIDE HCL 5 MG PO TABS
5.0000 mg | ORAL_TABLET | Freq: Three times a day (TID) | ORAL | Status: DC | PRN
Start: 2019-12-15 — End: 2019-12-30

## 2019-12-15 MED ORDER — HYDROCODONE-ACETAMINOPHEN 5-325 MG PO TABS: 1.0000 | ORAL_TABLET | ORAL | 0 refills | Status: DC | PRN

## 2019-12-15 MED ORDER — TAMSULOSIN HCL 0.4 MG PO CAPS
0.4000 mg | ORAL_CAPSULE | Freq: Every day | ORAL | Status: DC
  Administered 2019-12-16 – 2019-12-29 (×14): 0.4 mg via ORAL
  Filled 2019-12-15 (×14): qty 1

## 2019-12-15 MED ORDER — DOCUSATE SODIUM 100 MG PO CAPS
100.0000 mg | ORAL_CAPSULE | Freq: Two times a day (BID) | ORAL | 0 refills | Status: DC
Start: 2019-12-15 — End: 2019-12-30

## 2019-12-15 MED ORDER — HYDROCODONE-ACETAMINOPHEN 5-325 MG PO TABS
1.0000 | ORAL_TABLET | ORAL | Status: DC | PRN
  Administered 2019-12-16 – 2019-12-24 (×7): 1 via ORAL
  Filled 2019-12-15 (×7): qty 1

## 2019-12-15 MED ORDER — TAMSULOSIN HCL 0.4 MG PO CAPS
0.4000 mg | ORAL_CAPSULE | Freq: Every day | ORAL | Status: DC
Start: 2019-12-16 — End: 2020-06-06

## 2019-12-15 MED ORDER — ASPIRIN EC 325 MG PO TBEC: 325.0000 mg | DELAYED_RELEASE_TABLET | Freq: Every day | ORAL | Status: DC

## 2019-12-15 MED ORDER — METHOCARBAMOL 1000 MG/10ML IJ SOLN
500.0000 mg | Freq: Four times a day (QID) | INTRAVENOUS | Status: DC | PRN
  Filled 2019-12-15: qty 5

## 2019-12-15 MED ORDER — POTASSIUM CHLORIDE CRYS ER 20 MEQ PO TBCR
40.0000 meq | EXTENDED_RELEASE_TABLET | Freq: Once | ORAL | Status: AC
  Administered 2019-12-15: 40 meq via ORAL
  Filled 2019-12-15: qty 2

## 2019-12-15 MED ORDER — PANTOPRAZOLE SODIUM 40 MG PO TBEC
40.0000 mg | DELAYED_RELEASE_TABLET | Freq: Every day | ORAL | Status: DC
  Administered 2019-12-16 – 2019-12-30 (×15): 40 mg via ORAL
  Filled 2019-12-15 (×15): qty 1

## 2019-12-15 MED ORDER — LIDOCAINE (ANORECTAL) 50 MG RE SUPP: 50.0000 mg | Freq: Three times a day (TID) | RECTAL | Status: DC | PRN

## 2019-12-15 MED ORDER — ASPIRIN 81 MG PO CHEW: 81.0000 mg | CHEWABLE_TABLET | Freq: Two times a day (BID) | ORAL | 0 refills | Status: AC

## 2019-12-15 MED ORDER — METHOCARBAMOL 500 MG PO TABS
500.0000 mg | ORAL_TABLET | Freq: Four times a day (QID) | ORAL | Status: DC | PRN
  Administered 2019-12-17 – 2019-12-18 (×3): 500 mg via ORAL
  Filled 2019-12-15 (×4): qty 1

## 2019-12-15 MED ORDER — LOPERAMIDE HCL 2 MG PO CAPS
2.0000 mg | ORAL_CAPSULE | Freq: Four times a day (QID) | ORAL | Status: DC | PRN
  Administered 2019-12-16: 2 mg via ORAL
  Filled 2019-12-15: qty 1

## 2019-12-15 MED ORDER — ONDANSETRON HCL 4 MG/2ML IJ SOLN: 4.0000 mg | Freq: Four times a day (QID) | INTRAMUSCULAR | Status: DC | PRN

## 2019-12-15 MED ORDER — LEVETIRACETAM 250 MG PO TABS: 1250.0000 mg | ORAL_TABLET | Freq: Two times a day (BID) | ORAL | Status: DC

## 2019-12-15 MED ORDER — PANTOPRAZOLE SODIUM 40 MG PO TBEC
40.0000 mg | DELAYED_RELEASE_TABLET | Freq: Every day | ORAL | Status: DC
Start: 2019-12-16 — End: 2019-12-29

## 2019-12-15 NOTE — Progress Notes (Signed)
Carlos Gong, RN  Rehab Admission Coordinator  Physical Medicine and Rehabilitation  PMR Pre-admission     Signed  Date of Service:  12/13/2019  4:53 PM      Related encounter: ED to Hosp-Admission (Current) from 12/08/2019 in [facility]      Signed       Show:Clear all [x] Manual[x] Template[x] Copied  Added by: [x] Julious Payer, Vertis Kelch, RN[x] Bethel Born, CCC-SLP  [] Hover for details PMR Admission Coordinator Pre-Admission Assessment   Patient: Carlos [name] is an 74 y.o., male MRN: [id number] DOB: 1945-03-03 Height: 5' 7"  (170.2 cm) Weight: 60.3 kg                                                                                                                                                  Insurance Information HMO:     PPO:      PCP:      IPA:      80/20:      OTHER: POS   PRIMARY: [employer] NAP    Policy#: [id number]      Subscriber: patient CM Name: Myles Gip      Phone#: [phone]     Fax#: [phone] Pre-Cert#: [id number].  Approved for 7 days (11/9-11/15).  Review/clinicals due on 11/16.  Fax should be sent to [facility].      Employer:  Benefits:  Phone #: n/a-online at [email or web address] provider portal     Name:  Eff. Date: 02/06/2016-present     Deduct: $350 ($350 met)      Out of Pocket Max: $6,000 ($6,000 met)      Life Max: NA  CIR: $200 co-pay per admission, 100% coverage       SNF: 90% coverage, 10% co-insurance; limited to 40 visits Outpatient: 90% coverage; limited to 40 visits (24 remaining)     Co-Pay: 10% co-insurance Home Health: 90% coverage; limited to 50 visits      Co-Pay: 10% co-insurance DME: 90% coverage     Co-Pay: 10% co-insurance Providers: in-network  SECONDARY:   Medicare A    Policy#: [id number]      Phone#: verified online via [employer] on 12/14/19   Financial Counselor:       Phone#:    The "Data Collection Information Summary" for patients in Inpatient Rehabilitation Facilities with attached "[place] Records" was provided and verbally reviewed with: N/A   Emergency Contact Information         Contact Information     Name Relation Home Work Mobile    [place] Sister [place]    [place], Dileonardo [phone]   [phone]       Current Medical History  Patient Admitting Diagnosis: closed comminuted intertrochanteric fx of left femur; s/p left hip IM nailing   History of Present Illness: 74 year old right-handed male  with history of epilepsy maintained on Keppra, rectal adenocarcinoma maintained on Xeloda receiving radiation therapy followed by oncology services Dr.Feng as well as radiation oncology Dr. Lisbeth Renshaw, BPH, anxiety as well as history of mild cognitive disorder.   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  Mother has 24/7 assistance from personal care attendant.  Patient reportedly has good support of local family.  Presented 12/08/2019 to [facility] long hospital with reported breakthrough seizure after radiation treatment.  He was treated with Ativan IM and loaded with Keppra.  Cranial CT scan negative.  Admission chemistries potassium 3.0 glucose 136, hemoglobin 12.2 WBC 3.7, ammonia level 182 question false positive and repeated 24, lactic acid 1.9.  EEG with no seizure.  While patient was at [facility] long ED he did sustain another seizure and fell out of the bed without loss of consciousness landing on his left hip.  X-rays and imaging revealed left intertrochanteric femur fracture.  Patient underwent intramedullary fixation 12/11/2019 per Dr. Lyla Glassing.  Weightbearing as tolerated.  Maintained on aspirin 325 mg daily for DVT prophylaxis.  Acute blood loss anemia 8.4 and monitored.    Past Medical History      Past Medical History:  Diagnosis Date  . Anxiety    . Benign prostatic hyperplasia 03/30/2013    10/1 IMO update  . Cancer ([place])    . 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 Seizures ([place])         Family History  family history includes Brain cancer in his father; Dementia in his mother; Diabetes in his mother; Osteoporosis in his mother.   Prior Rehab/Hospitalizations:  Has the patient had prior rehab or hospitalizations prior to admission? Yes   Has the patient had major surgery during 100 days prior to admission? Yes   Current Medications    Current Facility-Administered Medications:  .  0.9 %  sodium chloride infusion, , Intravenous, Continuous, Dahal, Binaya, MD, Last Rate: 75 mL/hr at 12/14/19 0909, New Bag at 12/14/19 0909 .  aspirin EC tablet 325 mg, 325 mg, Oral, Daily, Dahal, Binaya, MD .  Chlorhexidine Gluconate Cloth 2 % PADS 6 each, 6 each, Topical, Daily, Dahal, Binaya, MD, 6 each at 12/14/19 0910 .  docusate sodium (COLACE) capsule 100 mg, 100 mg, Oral, BID, Swinteck, Aaron Edelman, MD, 100 mg at 12/14/19 2147 .  HYDROcodone-acetaminophen (NORCO/VICODIN) 5-325 MG per tablet 1 tablet, 1 tablet, Oral, Q4H PRN, Rod Can, MD, 1 tablet at 12/11/19 2109 .  levETIRAcetam (KEPPRA) tablet 1,250 mg, 1,250 mg, Oral, BID, Swinteck, Aaron Edelman, MD, 1,250 mg at 12/14/19 2147 .  Lidocaine (Anorectal) SUPP 50 mg, 50 mg, Rectal, Q8H PRN, Swinteck, Aaron Edelman, MD .  loperamide (IMODIUM) capsule 2 mg, 2 mg, Oral, Q6H PRN, Dahal, Binaya, MD, 2 mg at 12/14/19 1354 .  melatonin tablet 3 mg, 3 mg, Oral, QHS PRN, Dahal, Binaya, MD, 3 mg at 12/14/19 2147 .  menthol-cetylpyridinium (CEPACOL) lozenge 3 mg, 1 lozenge, Oral, PRN **OR** phenol (CHLORASEPTIC) mouth spray 1 spray, 1 spray, Mouth/Throat, PRN, Swinteck, Aaron Edelman, MD .  methocarbamol (ROBAXIN) tablet 500 mg, 500 mg, Oral, Q6H PRN **OR** methocarbamol (ROBAXIN) 500 mg in dextrose 5 % 50 mL IVPB, 500 mg, Intravenous, Q6H PRN, Swinteck, Aaron Edelman, MD .  metoCLOPramide (REGLAN) tablet 5-10 mg, 5-10 mg, Oral, Q8H PRN **OR** metoCLOPramide (REGLAN) injection 5-10 mg, 5-10 mg, Intravenous, Q8H PRN, Swinteck, Aaron Edelman, MD .  ondansetron (ZOFRAN) tablet 4 mg, 4 mg,  Oral, Q6H PRN, 4 mg at 12/13/19 1620 **OR** ondansetron (ZOFRAN)  injection 4 mg, 4 mg, Intravenous, Q6H PRN, Swinteck, Aaron Edelman, MD .  pantoprazole (PROTONIX) EC tablet 40 mg, 40 mg, Oral, Daily, Dahal, Binaya, MD, 40 mg at 12/14/19 0909 .  tamsulosin (FLOMAX) capsule 0.4 mg, 0.4 mg, Oral, Daily, Dahal, Binaya, MD, 0.4 mg at 12/14/19 0910   Patients Current Diet:     Diet Order                      Diet regular Room service appropriate? Yes; Fluid consistency: Thin  Diet effective now                      Precautions / Restrictions Precautions Precautions: Fall Restrictions Weight Bearing Restrictions: Yes LUE Weight Bearing: Weight bearing as tolerated    Has the patient had 2 or more falls or a fall with injury in the past year?pt fell ~2 times resulting in injuries but sister not sure if falls occurred in 2019 or [address] [facility] with femur injury leading to this admission for rehab. Office of patient experience involved and in contact with sister, [name].    Prior Activity Level Community (5-7x/wk): 5x/week for medical tx. 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   Prior Functional Level Prior Function Level of Independence: Independent 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post office   Self Care: Did the patient need help bathing, dressing, using the toilet or eating?  Independent   Indoor Mobility: Did the patient need assistance with walking from room to room (with or without device)? Independent   Stairs: Did the patient need assistance with internal or external stairs (with or without device)? Independent   Functional Cognition: Did the patient need help planning regular tasks such as shopping or remembering to take medications? Needed some help   Home Assistive Devices / Equipment Home Assistive Devices/Equipment: Eyeglasses Home Equipment: Kasandra Knudsen - single point (pt's mother utilizes all other DME owned)   Prior Device Use:  Indicate devices/aids used by the patient prior to current illness, exacerbation or injury? None of the above   Current Functional Level Cognition   Overall Cognitive Status: Impaired/Different from baseline Current Attention Level: Sustained Orientation Level: Oriented X4 Following Commands: Follows one step commands inconsistently, Follows one step commands with increased time Safety/Judgement: Decreased awareness of safety, Decreased awareness of deficits General Comments: Perseverating on abdominal pain and need to have a BM. Nursing assisted pt to[facility]from [facility] just prior to session and PT assisted to/from [facility] during session. Unsuccessful both attempts.    Extremity Assessment (includes Sensation/Coordination)   Upper Extremity Assessment: Generalized weakness  Lower Extremity Assessment: Defer to PT evaluation LLE Deficits / Details: L hip flexion 3-/5, knee flexion/extension 3-/5, ankle PF/DF 4/5, pain limiting     ADLs   Overall ADL's : Needs assistance/impaired Eating/Feeding: Set up, Sitting Grooming: Min guard, Sitting Upper Body Bathing: Minimal assistance, Sitting Lower Body Bathing: Maximal assistance, Sit to/from stand, Sitting/lateral leans Upper Body Dressing : Minimal assistance, Sitting Lower Body Dressing: Maximal assistance, Sit to/from stand Toileting- Clothing Manipulation and Hygiene: Total assistance, Sitting/lateral lean, Sit to/from stand Functional mobility during ADLs: Maximal assistance (sit<>stand at [facility])     Mobility   Overal bed mobility: Needs Assistance Bed Mobility: Supine to Sit, Sit to Supine Supine to sit: HOB elevated, Max assist Sit to supine: Mod assist General bed mobility comments: Pt received sitting EOB.     Transfers   Overall transfer level: Needs assistance Equipment  used: Conservation officer, nature (2 wheeled) Sports coach: [employer] Transfers: Sit to/from Stand, Risk manager Sit to Stand: Mod assist Stand pivot  transfers: Mod assist, +2 safety/equipment General transfer comment: cues for hand placement, assist to power up and stabilize balance     Ambulation / Gait / Stairs / Wheelchair Mobility   Ambulation/Gait Ambulation/Gait assistance: Mod assist, +2 safety/equipment Gait Distance (Feet): 15 Feet Assistive device: Rolling walker (2 wheeled) Gait Pattern/deviations: Step-to pattern, Decreased weight shift to left, Decreased stance time - left General Gait Details: cues for hand placement and sequencing, assist to maintain balance and manage RW Gait velocity: decreased Gait velocity interpretation: <1.31 ft/sec, indicative of household ambulator     Posture / Balance Dynamic Sitting Balance Sitting balance - Comments: static sit EOB without support Balance Overall balance assessment: Needs assistance Sitting-balance support: Feet supported, Single extremity supported Sitting balance-Leahy Scale: Fair Sitting balance - Comments: static sit EOB without support Standing balance support: Bilateral upper extremity supported, During functional activity Standing balance-Leahy Scale: Poor Standing balance comment: external assist and UE support      Special needs/care consideration Skin Avulsion: buttocks; Blister: buttocks; Pressure injury: R buttocks stage 2; Surgical incision: Left leg and Designated visitor Marcellas Marchant, sister; [name], sister  Completed chemo and radiation for rectal cancer. Not to receive last radiation treatments of 2 due to femur fracture. Planned cancer surgery at [place] in December.  Patient fell off stretcher in [facility] ED with femur injury. Office of patient experience involved and in contact with sister, [name]. Patient's brother in [place] unexpectedly died this past weekend. He was sibling local who would transport him to work and to all treatments. Funeral upcoming weekend.    Previous Home Environment  Living Arrangements: Parent (mother)  Lives With:  Spouse (caregivers/family present for 24 hour care) Available Help at Discharge: Personal care attendant (near 24/7 assistance from staff per pt and sister) Type of Home: House Home Layout: Two level Alternate Level Stairs-Rails: Right, Left Alternate Level Stairs-Number of Steps: 1 flight Home Access: Stairs to enter, Ramped entrance Entrance Stairs-Rails: Right Entrance Stairs-Number of Steps: 3 Bathroom Shower/Tub: Chiropodist: Standard Bathroom Accessibility: Yes How Accessible: Accessible via walker [place]: Other (Comment) (caregivers make sure he gets food and medicines.)   Discharge Living Setting Plans for Discharge Living Setting: Patient's home Type of Home at Discharge: House Discharge Home Layout: Two level Alternate Level Stairs-Rails: Right Alternate Level Stairs-Number of Steps: 13-16 Discharge Home Access: Stairs to enter, Ramped entrance Entrance Stairs-Rails: None Entrance Stairs-Number of Steps: 2-3 Discharge Bathroom Shower/Tub: Tub/shower unit Discharge Bathroom Toilet: Standard Discharge Bathroom Accessibility: Yes How Accessible: Accessible via walker Does the patient have any problems obtaining your medications?: No   Social/Family/Support Systems Patient Roles: Spouse Anticipated Caregiver: Lyanne Co, sister; [name], sister Anticipated Caregiver's Contact Information: [name]: [name]: [phone] Caregiver Availability: Other (Comment) (caregivers 4 days/week; family on weekends) Discharge Plan Discussed with Primary Caregiver: Yes Is Caregiver In Agreement with Plan?: Yes Does Caregiver/Family have Issues with Lodging/Transportation while Pt is in Rehab?: No   Patient lives with his 54 year old mother and she has caregivers supports hired and family on the weekends.[name], sister, will need recommendations for patient's care so that she can arrange prior to d/c.   Goals Patient/Family Goal for  Rehab: supervision: PT/OT/ST Expected length of stay: 10-14 days Pt/Family Agrees to Admission and willing to participate: Yes Program Orientation Provided & Reviewed with Pt/Caregiver Including  Roles  & Responsibilities: Yes   Decrease burden of Care through IP rehab admission: NA   Possible need for SNF placement upon discharge: NA   Patient Condition: This patient's medical and functional status has changed since the consult dated: 12/12/19 in which the Rehabilitation Physician determined and documented that the patient's condition is appropriate for intensive rehabilitative care in an inpatient rehabilitation facility. See "History of Present Illness" (above) for medical update. Functional changes are: Pt is 14' Mod A +2 with mobility and Min A-Max A with ADLs. Patient's medical and functional status update has been discussed with the Rehabilitation physician and patient remains appropriate for inpatient rehabilitation. Will admit to inpatient rehab today.   Preadmission Screen Completed By:  Cleatrice Burke, RN, 12/15/2019 11:04 AM ______________________________________________________________________   Discussed status with Dr. Dagoberto Ligas on 12/15/2019 at 1105 and received approval for admission today.   Admission Coordinator:  Cleatrice Burke, time 7129 Date 12/15/2019             Cosigned by: Courtney Heys, MD at 12/15/2019 11:30 AM  Revision History                                              Note Details  Author Carlos Gong, RN File Time 12/15/2019 11:04 AM  Author Type Rehab Admission Coordinator Status Signed  Last Editor Carlos Gong, RN Service Physical Medicine and Rehabilitation

## 2019-12-15 NOTE — Progress Notes (Signed)
Patient arrived on the unit in wheelchair, A&O x4, denied pain or discomfort. Patient educated on the POC and safety plan. Bed in lowest position and alarm on, call bell within reach. We continue to monitor.

## 2019-12-15 NOTE — Progress Notes (Signed)
Carlos Ribas, MD  Physician  Physical Medicine and Rehabilitation  Consult Note     Signed  Date of Service:  12/12/2019  4:07 PM      Related encounter: ED to Hosp-Admission (Current) from 12/08/2019 in [place] [facility]      Signed      Expand All Collapse All  Show:Clear all [x] Manual[x] Template[] Copied  Added by: [x] Raulkar, Clide Deutscher, MD  [] Hover for details          Physical Medicine and Rehabilitation Consult Reason for Consult: Breakthrough seizures Referring Physician: Magnus Sinning, MD     HPI: HENRI BAUMLER is a 74 y.o. 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 He fell off his bed in [facility] and broke his left hip, s/p L hip IM nailing 12/11/19. He thinks he is at work right now and says he never missed a day of work.      Review of Systems  Constitutional: Negative.   HENT: Negative.   Eyes: Negative.   Respiratory: Negative.   Cardiovascular: Negative.   Gastrointestinal: Negative.   Genitourinary: Negative.   Musculoskeletal: Negative.   Skin: Negative.   Neurological: Negative.   Endo/Heme/Allergies: Negative.   Psychiatric/Behavioral: Positive for memory loss.        Past Medical History:  Diagnosis Date  . Anxiety    . Benign prostatic hyperplasia 03/30/2013    10/1 IMO update  . Cancer ([place])    . 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 Seizures ([place])           Past Surgical History:  Procedure Laterality Date  . PORTACATH PLACEMENT N/A 07/01/2019    Procedure: PORT ULTRASOUND GUIDED PLACEMENT;  Surgeon: Alphonsa Overall, MD;  Location: [facility] ORS;  Service: General;  Laterality: N/A;  . PROSTATE SURGERY   2004  . TONSILECTOMY, ADENOIDECTOMY, BILATERAL MYRINGOTOMY AND TUBES             Family History  Problem Relation Age  of Onset  . Osteoporosis Mother    . Dementia Mother    . Diabetes Mother    . Brain cancer Father      Social History:  reports that he has never smoked. He has never used smokeless tobacco. He reports that he does not drink alcohol and does not use drugs. Allergies: No Known Allergies       Medications Prior to Admission  Medication Sig Dispense Refill  . bismuth subsalicylate (PEPTO BISMOL) 262 MG/15ML suspension Take 30 mLs by mouth every 6 (six) hours as needed for indigestion.      . capecitabine (XELODA) 500 MG tablet Take 3 tablets (1,500 mg total) by mouth 2 (two) times daily after a meal. Take on days of radiation, Monday through Friday (Patient taking differently: Take 500 mg by mouth See admin instructions. Takes 3 tablets in the morning and 3 tablets at night .Take on days of radiation, Monday through Friday. Takes after the radiation.) 90 tablet 0  . HYDROcodone-Acetaminophen 5-300 MG TABS Take 1 tablet by mouth every 8 (eight) hours as needed (for severe pain as needed). 20 tablet 0  . levETIRAcetam (KEPPRA) 1000 MG tablet Take 1 tablet (1,000 mg total) by mouth 2 (two) times daily. 60 tablet 11  . lidocaine-prilocaine (EMLA) cream Apply 1 application topically daily as needed (pain).       Marland Kitchen  Multiple Vitamin (MULTIVITAMIN WITH MINERALS) TABS tablet Take 1 tablet by mouth daily.      Marland Kitchen OVER THE COUNTER MEDICATION Take 1 tablet by mouth daily. Mind work -  Supplement for brain      . traMADol (ULTRAM) 50 MG tablet Take 1 tablet (50 mg total) by mouth every 6 (six) hours as needed for moderate pain. 30 tablet 0  . Lidocaine, Anorectal, 50 MG SUPP Place 50 mg rectally every 8 (eight) hours as needed. (Patient not taking: Reported on 12/08/2019) 14 suppository 2  . potassium chloride SA (KLOR-CON) 20 MEQ tablet Take 1 tablet (20 mEq total) by mouth daily. (Patient not taking: Reported on 12/08/2019) 14 tablet 0      Home: Home Living Family/patient expects to be discharged to::  Private residence Living Arrangements: Spouse/significant other Available Help at Discharge: Family, Personal care attendant, Available 24 hours/day Type of Home: House Home Access: Stairs to enter, Ramped entrance Technical brewer of Steps: 2-3 Entrance Stairs-Rails: None Home Layout: Two level Alternate Level Stairs-Number of Steps: 13-16 Alternate Level Stairs-Rails: Right Bathroom Shower/Tub: Chiropodist: Standard Bathroom Accessibility: Yes Home Equipment: Cane - single point (pt's mother utilizes all other DME owned)  Lives With: Spouse (caregivers/family present for 24 hour care)  Functional History: Prior Function Level of Independence: Independent 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[employer] Functional Status:  Mobility: Bed Mobility Overal bed mobility: Needs Assistance Bed Mobility: Supine to Sit Supine to sit: Mod assist, HOB elevated General bed mobility comments: pt requires assistance to mobilize LLE and to pull trunk into sitting Transfers Overall transfer level: Needs assistance Equipment used: Rolling walker (2 wheeled) Transfers: Sit to/from Stand Sit to Stand: Mod assist General transfer comment: increased time, PT cueing for hand placement and technique. Pt requires PT physical assistance to power up into standing Ambulation/Gait Ambulation/Gait assistance: Mod assist Gait Distance (Feet): 2 Feet Assistive device: Rolling walker (2 wheeled) Gait Pattern/deviations: Step-to pattern General Gait Details: short step to gait with reduced LLE foot clearance, increased trunk flexion, 2 losses of balance when stepping backward to turn to chair Gait velocity: reduced Gait velocity interpretation: <1.31 ft/sec, indicative of household ambulator   ADL:   Cognition: Cognition Overall Cognitive Status: Impaired/Different from baseline Orientation Level: Oriented to person, Disoriented to place, Disoriented to time, Disoriented to  situation Cognition Arousal/Alertness: Awake/alert Behavior During Therapy: WFL for tasks assessed/performed Overall Cognitive Status: Impaired/Different from baseline Area of Impairment: Orientation, Attention, Memory, Following commands, Safety/judgement, Awareness, Problem solving Orientation Level: Disoriented to, Time (pt is able to report date but poor memory of when sx happene) Current Attention Level: Sustained Memory: Decreased recall of precautions, Decreased short-term memory Following Commands: Follows one step commands consistently Safety/Judgement: Decreased awareness of safety, Decreased awareness of deficits Awareness: Emergent Problem Solving: Slow processing, Difficulty sequencing, Requires verbal cues   Blood pressure 113/69, pulse 100, temperature 98.5 F (36.9 C), resp. rate 20, height 5\' 7"  (1.702 m), weight 61.7 kg, SpO2 99 %. Physical Exam    General: Very confused, AOx1 HEENT: Head is normocephalic, atraumatic, PERRLA, EOMI, sclera anicteric, oral mucosa pink and moist, dentition intact, ext ear canals clear,  Neck: Supple without JVD or lymphadenopathy Heart: Reg rate and rhythm. No murmurs rubs or gallops Chest: CTA bilaterally without wheezes, rales, or rhonchi; no distress Abdomen: Soft, non-tender, non-distended, bowel sounds positive. Extremities: No clubbing, cyanosis, or edema. Pulses are 2+ Skin: L Hip incision C/D/I, b/l buttock pressure injuries Neuro: Pt is very confused, thinks he is  at work. Acknowledges memory loss. Cranial nerves 2-12 are intact. Sensory exam is normal. Reflexes are 2+ in all 4's. Fine motor coordination is intact. No tremors. Motor function is grossly 5/5.  Psych: Pt's affect is appropriate. Pt is cooperative     Lab Results Last 24 Hours       Results for orders placed or performed during the hospital encounter of 12/08/19 (from the past 24 hour(s))  CBC     Status: Abnormal    Collection Time: 12/12/19  1:57 AM  Result  Value Ref Range    WBC 7.9 4.0 - 10.5 K/uL    RBC 3.12 (L) 4.22 - 5.81 MIL/uL    Hemoglobin 9.6 (L) 13.0 - 17.0 g/dL    HCT 29.3 (L) 39 - 52 %    MCV 93.9 80.0 - 100.0 fL    MCH 30.8 26.0 - 34.0 pg    MCHC 32.8 30.0 - 36.0 g/dL    RDW 16.5 (H) 11.5 - 15.5 %    Platelets 113 (L) 150 - 400 K/uL    nRBC 0.0 0.0 - 0.2 %  Basic metabolic panel     Status: Abnormal    Collection Time: 12/12/19  1:57 AM  Result Value Ref Range    Sodium 137 135 - 145 mmol/L    Potassium 3.5 3.5 - 5.1 mmol/L    Chloride 101 98 - 111 mmol/L    CO2 27 22 - 32 mmol/L    Glucose, Bld 111 (H) 70 - 99 mg/dL    BUN 10 8 - 23 mg/dL    Creatinine, Ser 0.68 0.61 - 1.24 mg/dL    Calcium 8.5 (L) 8.9 - 10.3 mg/dL    GFR, Estimated >60 >60 mL/min    Anion gap 9 5 - 15       Imaging Results (Last 48 hours)  Pelvis Portable   Result Date: 12/11/2019 CLINICAL DATA:  Status post ORIF of proximal left femur. EXAM: PORTABLE PELVIS 1-2 VIEWS COMPARISON:  Earlier today FINDINGS: The patient is status post ORIF of comminuted fracture deformity involving the intertrochanteric portions of the proximal left femur. There is been placement of IM rod with compression screw. The hardware components and fracture fragments are in anatomic alignment. IMPRESSION: 1. Status post ORIF of proximal left femur. Electronically Signed   By: Kerby Moors M.D.   On: 12/11/2019 10:23    DG C-Arm 1-60 Min   Result Date: 12/11/2019 CLINICAL DATA:  Left intertrochanteric hip fracture. EXAM: OPERATIVE LEFT HIP (WITH PELVIS IF PERFORMED) 3 VIEWS TECHNIQUE: Fluoroscopic spot image(s) were submitted for interpretation post-operatively. COMPARISON:  12/09/2019 FINDINGS: Intramedullary rod and compression screw fixation of the previously demonstrated left femoral intertrochanteric fracture with anatomic position and alignment. IMPRESSION: Hardware fixation of the previously demonstrated left femoral intertrochanteric fracture with anatomic position and  alignment. Electronically Signed   By: Claudie Revering M.D.   On: 12/11/2019 08:59    DG HIP OPERATIVE UNILAT W OR W/O PELVIS LEFT   Result Date: 12/11/2019 CLINICAL DATA:  Left intertrochanteric hip fracture. EXAM: OPERATIVE LEFT HIP (WITH PELVIS IF PERFORMED) 3 VIEWS TECHNIQUE: Fluoroscopic spot image(s) were submitted for interpretation post-operatively. COMPARISON:  12/09/2019 FINDINGS: Intramedullary rod and compression screw fixation of the previously demonstrated left femoral intertrochanteric fracture with anatomic position and alignment. IMPRESSION: Hardware fixation of the previously demonstrated left femoral intertrochanteric fracture with anatomic position and alignment. Electronically Signed   By: Claudie Revering M.D.   On: 12/11/2019 08:59  Assessment/Plan: Diagnosis: Breakthrough seziure 1. Does the need for close, 24 hr/day medical supervision in concert with the patient's rehab needs make it unreasonable for this patient to be served in a less intensive setting? Yes 2. Co-Morbidities requiring supervision/potential complications: closed comminuted intertrochanteric fracture of left femur, pressure injury of bilateral buttocks. PMH, mild neurocognitive disorder of unclear etiology, rectal adenocarcinoma 3. Due to bladder management, bowel management, safety, skin/wound care, disease management, medication administration, pain management, patient education, he require 24 hr/day rehab nursing? Yes 4. Does the patient require coordinated care of a physician, rehab nurse, therapy disciplines of PT, OT, SLP to address physical and functional deficits in the context of the above medical diagnosis(es)? Yes Addressing deficits in the following areas: balance, endurance, locomotion, strength, transferring, bowel/bladder control, bathing, dressing, feeding, grooming, toileting, cognition and psychosocial support 5. Can the patient actively participate in an intensive therapy program of at least  3 hrs of therapy per day at least 5 days per week? Yes 6. The potential for patient to make measurable gains while on inpatient rehab is good 7. Anticipated functional outcomes upon discharge from inpatient rehab are supervision  with PT, supervision with OT, supervision with SLP. 8. Estimated rehab length of stay to reach the above functional goals is: 10-14 days 9. Anticipated discharge destination: Home 10. Overall Rehab/Functional Prognosis: good   RECOMMENDATIONS: This patient's condition is appropriate for continued rehabilitative care in the following setting: CIR Patient has agreed to participate in recommended program. Yes Note that insurance prior authorization may be required for reimbursement for recommended care.   Comment: Mr. Olafson would be a good candidate if he has 24/7 supervision due to his significant long-term cognitive deficits.   Carlos Ribas, MD 12/12/2019        Routing History                Note Details  Author Ranell Patrick, Clide Deutscher, MD File Time 12/12/2019  4:18 PM  Author Type Physician Status Signed  Last Editor Carlos Ribas, MD Service Physical Medicine and Rehabilitation

## 2019-12-15 NOTE — H&P (Signed)
Physical Medicine and Rehabilitation Admission H&P    No chief complaint on file. : HPI: Carlos [name] is a 74 year old right-handed male with history of epilepsy maintained on Keppra, rectal adenocarcinoma maintained on Xeloda receiving radiation therapy followed by oncology services Dr.Feng as well as radiation oncology Dr. Lisbeth Renshaw, BPH, anxiety as well as history of mild cognitive disorder. Per chart review patient lives with his 38 year old mother. Independent prior to admission working at the [employer]. His mother has 24-hour personal care attendants. Patient reportedly has good support of local family.  Presented 12/08/2019 to [facility] long hospital with reported breakthrough seizure after radiation treatment.  He was treated with Ativan IM and loaded with Keppra.  Cranial CT scan negative.  Admission chemistries potassium 3.0 glucose 136, hemoglobin 12.2 WBC 3.7, ammonia level 182 question false positive and repeated 24, lactic acid 1.9.  EEG with no seizure.  While patient was at [facility] ED he did sustain another seizure and fell out of the bed without loss of consciousness landing on his left hip.  X-rays and imaging revealed left intertrochanteric femur fracture.  Patient underwent intramedullary fixation 12/11/2019 per Dr. Lyla Glassing.  Weightbearing as tolerated.  Maintained on aspirin 325 mg daily for DVT prophylaxis.  Acute blood loss anemia 8.4 and monitored.  Therapy evaluations completed and patient was admitted for a comprehensive rehab program.  Pt reports no pain at rest- but appears that hurts when attempts to turn over- although still denies it.  Perseverating on why having seizures.  Voiding ok-has condom cathter-   Review of Systems  Constitutional: Negative for chills and fever.  HENT: Negative for hearing loss.   Eyes: Negative for blurred vision and double vision.  Respiratory: Negative for cough and shortness of breath.   Cardiovascular: Negative for chest pain  and palpitations.  Gastrointestinal: Positive for constipation. Negative for heartburn, nausea and vomiting.  Genitourinary: Negative for dysuria, flank pain and hematuria.  Musculoskeletal: Positive for joint pain and myalgias.  Skin: Negative for rash.  Neurological: Positive for seizures.  Psychiatric/Behavioral:       Anxiety  All other systems reviewed and are negative.  Past Medical History:  Diagnosis Date  . Anxiety   . Benign prostatic hyperplasia 03/30/2013   10/1 IMO update  . Cancer ([place])   . 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 Seizures ([place])    Past Surgical History:  Procedure Laterality Date  . INTRAMEDULLARY (IM) NAIL INTERTROCHANTERIC Left 12/11/2019   Procedure: INTRAMEDULLARY (IM) NAIL INTERTROCHANTRIC;  Surgeon: Rod Can, MD;  Location: [facility];  Service: Orthopedics;  Laterality: Left;  . PORTACATH PLACEMENT N/A 07/01/2019   Procedure: PORT ULTRASOUND GUIDED PLACEMENT;  Surgeon: Alphonsa Overall, MD;  Location: [facility];  Service: General;  Laterality: N/A;  . PROSTATE SURGERY  2004  . TONSILECTOMY, ADENOIDECTOMY, BILATERAL MYRINGOTOMY AND TUBES     Family History  Problem Relation Age of Onset  . Osteoporosis Mother   . Dementia Mother   . Diabetes Mother   . Brain cancer Father    Social History:  reports that he has never smoked. He has never used smokeless tobacco. He reports that he does not drink alcohol and does not use drugs. Allergies: No Known Allergies Medications Prior to Admission  Medication Sig Dispense Refill  . aspirin (ASPIRIN CHILDRENS) 81 MG chewable tablet Chew 1 tablet (81 mg total) by mouth 2 (two) times daily with a meal. 90 tablet 0  . bismuth subsalicylate (PEPTO BISMOL) 262  MG/15ML suspension Take 30 mLs by mouth every 6 (six) hours as needed for indigestion.    . docusate sodium (COLACE) 100 MG capsule Take 1 capsule (100 mg total) by mouth 2 (two) times daily. 10 capsule 0  . HYDROcodone-acetaminophen  (NORCO/VICODIN) 5-325 MG tablet Take 1 tablet by mouth every 4 (four) hours as needed for moderate pain. 40 tablet 0  . levETIRAcetam (KEPPRA) 250 MG tablet Take 5 tablets (1,250 mg total) by mouth 2 (two) times daily.    . Lidocaine, Anorectal, 50 MG SUPP Place 50 mg rectally every 8 (eight) hours as needed. (Patient not taking: Reported on 12/08/2019) 14 suppository 2  . lidocaine-prilocaine (EMLA) cream Apply 1 application topically daily as needed (pain).     Marland Kitchen loperamide (IMODIUM) 2 MG capsule Take 1 capsule (2 mg total) by mouth every 6 (six) hours as needed for diarrhea or loose stools. 30 capsule 0  . melatonin 3 MG TABS tablet Take 1 tablet (3 mg total) by mouth at bedtime as needed (insomnia).  0  . methocarbamol (ROBAXIN) 500 MG tablet Take 1 tablet (500 mg total) by mouth every 6 (six) hours as needed for muscle spasms.    . metoCLOPramide (REGLAN) 5 MG tablet Take 1-2 tablets (5-10 mg total) by mouth every 8 (eight) hours as needed for nausea (if ondansetron (ZOFRAN) ineffective.).    Marland Kitchen Multiple Vitamin (MULTIVITAMIN WITH MINERALS) TABS tablet Take 1 tablet by mouth daily.    . ondansetron (ZOFRAN) 4 MG tablet Take 1 tablet (4 mg total) by mouth every 6 (six) hours as needed for nausea. 20 tablet 0  . [START ON 12/16/2019] pantoprazole (PROTONIX) 40 MG tablet Take 1 tablet (40 mg total) by mouth daily.    Derrill Memo ON 12/16/2019] tamsulosin (FLOMAX) 0.4 MG CAPS capsule Take 1 capsule (0.4 mg total) by mouth daily. 30 capsule     Drug Regimen Review Drug regimen was reviewed and remains appropriate with no significant issues identified  Home: Home Living Family/patient expects to be discharged to:: Private residence Living Arrangements: Parent   Functional History:    Functional Status:  Mobility:          ADL:    Cognition: Cognition Orientation Level: Oriented X4    Physical Exam: Blood pressure (!) 97/54, pulse 77, temperature (!) 97.5 F (36.4 C), temperature  source Oral, resp. rate 16, height 5\' 7"  (1.702 m), weight 59.5 kg, SpO2 100 %. Physical Exam Vitals and nursing note reviewed.  Constitutional:      Comments: Awake, alert, perseverating on seizures- laying in bed- supine, appropriate, Ox2, NAD  HENT:     Head: Normocephalic and atraumatic.     Comments: Wearing eyeglasses- has no facial asymmetry    Right Ear: External ear normal.     Left Ear: External ear normal.     Nose: Nose normal. No congestion.     Mouth/Throat:     Mouth: Mucous membranes are moist.     Pharynx: Oropharynx is clear. No oropharyngeal exudate.  Eyes:     General:        Right eye: No discharge.        Left eye: No discharge.     Extraocular Movements: Extraocular movements intact.  Neck:     Comments: Temporal wasting B/L Cardiovascular:     Rate and Rhythm: Normal rate and regular rhythm.  Pulmonary:     Comments: CTA B/L- no W/R/R- good air movement Abdominal:     Comments: Soft,  NT, ND, (+)BS    Genitourinary:    Comments: Wearing condom catheter Musculoskeletal:     Cervical back: Normal range of motion. No rigidity.     Comments: UEs- 5/5 B/L RLE 5/5 in HF, KE, KF, DF and PF LLE- L HF ~ 2/5, KE 2/5- DF and PF 5-5/ due to pain  Skin:    Comments: L hip- surgical dressing with minimal swelling B/L buttocks skin tears-  Appear as stage II 2 on each buttock- ~ silver dollar sized  Neurological:     Comments: Patient is alert in no acute distress. Makes eye contact with examiner and follows commands. He is able to provide his name age and date of birth. [place]. Intact sensation to light touch in all 4 extremities Ox2- slowed processing and perseverating on seizures  Psychiatric:     Comments: Perseverating on seizures     Results for orders placed or performed during the hospital encounter of 12/08/19 (from the past 48 hour(s))  CBC     Status: Abnormal   Collection Time: 12/14/19  1:49 AM  Result Value Ref Range   WBC 6.3  4.0 - 10.5 K/uL   RBC 2.87 (L) 4.22 - 5.81 MIL/uL   Hemoglobin 8.9 (L) 13.0 - 17.0 g/dL   HCT 26.6 (L) 39 - 52 %   MCV 92.7 80.0 - 100.0 fL   MCH 31.0 26.0 - 34.0 pg   MCHC 33.5 30.0 - 36.0 g/dL   RDW 16.0 (H) 11.5 - 15.5 %   Platelets 161 150 - 400 K/uL   nRBC 0.0 0.0 - 0.2 %    Comment: Performed at [facility], [address], [place], [place] [address]  Basic metabolic panel     Status: Abnormal   Collection Time: 12/15/19  3:36 AM  Result Value Ref Range   Sodium 139 135 - 145 mmol/L   Potassium 3.2 (L) 3.5 - 5.1 mmol/L   Chloride 105 98 - 111 mmol/L   CO2 25 22 - 32 mmol/L   Glucose, Bld 104 (H) 70 - 99 mg/dL    Comment: Glucose reference range applies only to samples taken after fasting for at least 8 hours.   BUN 9 8 - 23 mg/dL   Creatinine, Ser 0.59 (L) 0.61 - 1.24 mg/dL   Calcium 8.2 (L) 8.9 - 10.3 mg/dL   GFR, Estimated >60 >60 mL/min    Comment: (NOTE) Calculated using the CKD-EPI Creatinine Equation (2021)    Anion gap 9 5 - 15    Comment: Performed at [facility] [address] [place], [place] [address]  CBC with Differential/Platelet     Status: Abnormal   Collection Time: 12/15/19  3:36 AM  Result Value Ref Range   WBC 4.7 4.0 - 10.5 K/uL   RBC 2.68 (L) 4.22 - 5.81 MIL/uL   Hemoglobin 8.4 (L) 13.0 - 17.0 g/dL   HCT 25.6 (L) 39 - 52 %   MCV 95.5 80.0 - 100.0 fL   MCH 31.3 26.0 - 34.0 pg   MCHC 32.8 30.0 - 36.0 g/dL   RDW 16.1 (H) 11.5 - 15.5 %   Platelets 153 150 - 400 K/uL   nRBC 0.0 0.0 - 0.2 %   Neutrophils Relative % 73 %   Neutro Abs 3.4 1.7 - 7.7 K/uL   Lymphocytes Relative 8 %   Lymphs Abs 0.4 (L) 0.7 - 4.0 K/uL   Monocytes Relative 14 %   Monocytes  Absolute 0.7 0.1 - 1.0 K/uL   Eosinophils Relative 3 %   Eosinophils Absolute 0.1 0.0 - 0.5 K/uL   Basophils Relative 0 %   Basophils Absolute 0.0 0.0 - 0.1 K/uL   Immature Granulocytes 2 %   Abs Immature Granulocytes 0.11 (H) 0.00 - 0.07 K/uL    Comment: Performed at [facility] [address], [place],  [address]   No results found.     Medical Problem List and Plan: 1.  Decreased functional mobility secondary to breakthrough seizure complicated by left intertrochanteric femur fracture.  Status post intramedullary fixation 12/11/2019.  Weightbearing as tolerated  -patient may  Shower-- cover L  Hip for shower  -ELOS/Goals: 10-14 days - Supervision due to cognitive issues- chronic 2.  Antithrombotics:  -DVT/anticoagulation: SCDs  11/9- suggest Lovenox due to cancer dx- but will let primary determine plan  -antiplatelet therapy: Aspirin 325 mg daily 3. Pain Management: Robaxin and hydrocodone as needed 4. Mood: Melatonin 3 mg nightly as needed  -antipsychotic agents: N/A 5. Neuropsych: This patient is not capable of making decisions on his own behalf. 6. Skin/Wound Care: Routine skin checks 7. Fluids/Electrolytes/Nutrition: Routine in and outs with follow-up chemistries 8.  Acute blood loss anemia.  Follow-up CBC 9.  Seizure disorder.  Keppra 1250 mg twice daily.  EEG negative 10.  Rectal adenocarcinoma.  Follow-up oncology service with Dr. Annamaria Boots as well as radiation oncology Dr. Lisbeth Renshaw.  Radiation treatments currently on hold after hip fracture and Xeloda has been stopped for now   Morley Kos, PA-C 12/15/19   I have personally performed a face to face diagnostic evaluation of this patient and formulated the key components of the plan.  Additionally, I have personally reviewed laboratory data, imaging studies, as well as relevant notes and concur with the physician assistant's documentation above.          Courtney Heys, MD 12/15/2019

## 2019-12-15 NOTE — Progress Notes (Signed)
Physical Therapy Treatment Patient Details Name: Carlos [name] MRN: [id number] DOB: December 28, 1945 Today's Date: 12/15/2019    History of Present Illness 74 y.o. 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 Patient presented to the [facility] ED on 11/2 via EMS for a breakthrough seizure. Patient had radiation treatment on the day of admission. Family stated he took a nap after the treatment and was difficult to wake up. They also noticed blood in his mouth. EMS brought him to ED. Pt found to have generalized tonic-clonic seizure in ED. Pt fell off bed at [facility] long and broke L hip. Pt underwent L hip IM nailing on 12/11/2019.    PT Comments    Pt limited with mobility this date secondary to bowel issues, nurse notified. He was able to ambulate ~5 ft x2 bouts with modAx2 and with utilization of a RW without LOB though. He demonstrated decreased stance time on his L LE and thus decreased R LE step length, with min correction when cued. He also required assistance at his L LE with modA to transition supine <> sit in bed. His LE weakness is evident also as he requires modA to perform STS transfer from a lower surface without arm rests compared to being able to perform a STS transfer with minA from the bed with placement of his hands on the bed to assist to push up. Will continue to follow acutely and recommend CIR upon d/c to address his deficits and maximize his independence and safety with all functional mobility.   Follow Up Recommendations  CIR     Equipment Recommendations  Rolling walker with 5" wheels;Wheelchair (measurements PT)    Recommendations for Other Services Rehab consult     Precautions / Restrictions Precautions Precautions: Fall Restrictions Weight Bearing Restrictions: Yes LLE Weight Bearing: Weight bearing as tolerated    Mobility  Bed Mobility Overal bed  mobility: Needs Assistance Bed Mobility: Supine to Sit;Sit to Supine     Supine to sit: HOB elevated;Mod assist Sit to supine: HOB elevated;Mod assist   General bed mobility comments: pt required MOD A +1 needing most assist to maneuver LLE during bed mobility and elevate trunk into sitting  Transfers Overall transfer level: Needs assistance Equipment used: Rolling walker (2 wheeled) Transfers: Sit to/from Stand Sit to Stand: Min assist;Mod assist         General transfer comment: pt required MIN A and cues for hand placement to power up into standing from EOB but required MODA to sit<>stand from chair with no arm rests. Extra time to power up to stand.  Ambulation/Gait Ambulation/Gait assistance: Mod assist;+2 safety/equipment Gait Distance (Feet): 5 Feet (x2 bouts) Assistive device: Rolling walker (2 wheeled) Gait Pattern/deviations: Step-to pattern;Decreased weight shift to left;Decreased stance time - left;Decreased step length - right Gait velocity: decreased Gait velocity interpretation: <1.31 ft/sec, indicative of household ambulator General Gait Details: Cued pt for proper hand placement on RW. VCs to inc L stance time to improve step-through with R, min momentary success.   Stairs             Wheelchair Mobility    Modified Rankin (Stroke Patients Only)       Balance Overall balance assessment: Needs assistance Sitting-balance support: Feet supported;No upper extremity supported Sitting balance-Leahy Scale: Fair Sitting balance - Comments: static sit EOB without support and supervision for safety   Standing balance support: Bilateral upper extremity  supported;During functional activity Standing balance-Leahy Scale: Poor Standing balance comment: B UE support on RW, displaying trunk flexion and sway.                            Cognition Arousal/Alertness: Awake/alert Behavior During Therapy: WFL for tasks assessed/performed Overall  Cognitive Status: Impaired/Different from baseline Area of Impairment: Attention;Memory;Following commands;Safety/judgement;Awareness;Problem solving                   Current Attention Level: Sustained Memory: Decreased short-term memory Following Commands: Follows one step commands with increased time Safety/Judgement: Decreased awareness of safety;Decreased awareness of deficits Awareness: Emergent Problem Solving: Slow processing;Difficulty sequencing;Requires verbal cues;Requires tactile cues General Comments: Perseverating on stomach pain and needing immodium. pt noted to repeat statements such as concern for side effects from doing chemo and radiation but required cues to redirect pt back to session      Exercises      General Comments        Pertinent Vitals/Pain Pain Assessment: Faces Faces Pain Scale: Hurts even more Pain Location: stomach Pain Descriptors / Indicators: Grimacing;Discomfort;Cramping Pain Intervention(s): Limited activity within patient's tolerance;Monitored during session;Repositioned;Patient requesting pain meds-RN notified    Home Living                      Prior Function            PT Goals (current goals can now be found in the care plan section) Acute Rehab PT Goals Patient Stated Goal: home and to improve bowel function PT Goal Formulation: With patient Time For Goal Achievement: 12/26/19 Potential to Achieve Goals: Good Progress towards PT goals: Progressing toward goals    Frequency    Min 5X/week      PT Plan Current plan remains appropriate    Co-evaluation PT/OT/SLP Co-Evaluation/Treatment: Yes Reason for Co-Treatment: For patient/therapist safety;To address functional/ADL transfers PT goals addressed during session: Mobility/safety with mobility;Balance;Proper use of DME        AM-PAC PT "6 Clicks" Mobility   Outcome Measure  Help needed turning from your back to your side while in a flat bed without  using bedrails?: A Little Help needed moving from lying on your back to sitting on the side of a flat bed without using bedrails?: A Lot Help needed moving to and from a bed to a chair (including a wheelchair)?: A Lot Help needed standing up from a chair using your arms (e.g., wheelchair or bedside chair)?: A Little Help needed to walk in hospital room?: A Lot Help needed climbing 3-5 steps with a railing? : Total 6 Click Score: 13    End of Session Equipment Utilized During Treatment: Gait belt Activity Tolerance: Patient tolerated treatment well;Treatment limited secondary to medical complications (Comment) (bowel issues) Patient left: in bed;with call bell/phone within reach;with bed alarm set Nurse Communication: Other (comment) (bowel issues) PT Visit Diagnosis: Unsteadiness on feet (R26.81);Other abnormalities of gait and mobility (R26.89);Muscle weakness (generalized) (M62.81);Pain;History of falling (Z91.81);Difficulty in walking, not elsewhere classified (R26.2) Pain - Right/Left:  (stomach) Pain - part of body:  (stomach)     Time: 3710-6269 PT Time Calculation (min) (ACUTE ONLY): 15 min  Charges:  $Gait Training: 8-22 mins                     Moishe Spice, PT, DPT Acute Rehabilitation Services  Pager: [phone] Office: [place] 12/15/2019, 1:00  PM   

## 2019-12-15 NOTE — TOC Transition Note (Signed)
Transition of Care [facility]) - CM/SW Discharge Note   Patient Details  Name: Carlos [name] MRN: [id number] Date of Birth: [birth year]  Transition of [facility]) CM/SW Contact:  Pollie Friar, RN Phone Number: 12/15/2019, 12:23 PM   Clinical Narrative:    Pt discharging to CIR today. TOC signing off.   Final next level of care: IP Rehab Facility Barriers to Discharge: No Barriers Identified   Patient Goals and CMS Choice Patient states their goals for this hospitalization and ongoing recovery are:: Pt unable to participate in goal setting due to disorientation, family agreeable to CIR. CMS Medicare.gov Compare Post Acute Care list provided to:: Patient Represenative (must comment) (Sister, [name]) Choice offered to / list presented to : Sibling  Discharge Placement                       Discharge Plan and Services In-house Referral: Clinical Social Work   Post Acute Care Choice: IP Rehab                               Social Determinants of Health (SDOH) Interventions     Readmission Risk Interventions No flowsheet data found.

## 2019-12-15 NOTE — Progress Notes (Signed)
Inpatient Rehabilitation Admissions Coordinator  I met at bedside with patient and then contacted his sister, [name], by phone to discuss CIR admit. They are in agreement and bed is available today. I will contact D.r Dahal, acute team and TOC and make arrangements to admit today.  Danne Baxter, RN, MSN Rehab Admissions Coordinator [phone] 12/15/2019 10:56 AM

## 2019-12-15 NOTE — Plan of Care (Signed)
Patient's family/sister's very involved in pt's care and provide care for him outside the hospital setting. Family have been updated from the care team and questions have been addressed accordingly. Adequate for discharge.   Problem: Education: Goal: Knowledge of General Education information will improve Description: Including pain rating scale, medication(s)/side effects and non-pharmacologic comfort measures Outcome: Adequate for Discharge   Problem: Health Behavior/Discharge Planning: Goal: Ability to manage health-related needs will improve Outcome: Adequate for Discharge   Problem: Clinical Measurements: Goal: Ability to maintain clinical measurements within normal limits will improve Outcome: Adequate for Discharge Goal: Will remain free from infection Outcome: Adequate for Discharge Goal: Diagnostic test results will improve Outcome: Adequate for Discharge Goal: Respiratory complications will improve Outcome: Adequate for Discharge Goal: Cardiovascular complication will be avoided Outcome: Adequate for Discharge   Problem: Activity: Goal: Risk for activity intolerance will decrease Outcome: Adequate for Discharge   Problem: Nutrition: Goal: Adequate nutrition will be maintained Outcome: Adequate for Discharge   Problem: Coping: Goal: Level of anxiety will decrease Outcome: Adequate for Discharge   Problem: Elimination: Goal: Will not experience complications related to bowel motility Outcome: Adequate for Discharge Goal: Will not experience complications related to urinary retention Outcome: Adequate for Discharge   Problem: Pain Managment: Goal: General experience of comfort will improve Outcome: Adequate for Discharge   Problem: Safety: Goal: Ability to remain free from injury will improve Outcome: Adequate for Discharge   Problem: Skin Integrity: Goal: Risk for impaired skin integrity will decrease Outcome: Adequate for Discharge

## 2019-12-15 NOTE — Progress Notes (Signed)
Occupational Therapy Treatment Patient Details Name: Carlos [name] MRN: [id number] DOB: July 16, 1945 Today's Date: 12/15/2019    History of present illness 74 y.o. 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 Patient presented to the [facility] ED on 11/2 via EMS for a breakthrough seizure. Patient had radiation treatment on the day of admission. Family stated he took a nap after the treatment and was difficult to wake up. They also noticed blood in his mouth. EMS brought him to ED. Pt found to have generalized tonic-clonic seizure in ED. Pt fell off bed at [facility] long and broke L hip. Pt underwent L hip IM nailing on 12/11/2019.   OT comments  Pt seen in conjunction with PT to maximize pts activity tolerance. Pt limited by stomach pain this session with pt c/o ABD cramping needing to have BM- RN aware. Pt continues to present with pain, decreased activity tolerance and generalized weakness impacting pts ability to complete BADLs independently. Pt perseverating on stomach pain and repeats self often needing cues to reorient back to session. Pt was agreeable to transition OOB to complete household distance functional mobility with Rw MINA +2 for safety. Pt required up to MOD A to stand from chair with no arm rests. Pt would continue to benefit from skilled occupational therapy while admitted and after d/c to address the below listed limitations in order to improve overall functional mobility and facilitate independence with BADL participation. DC plan remains appropriate, will follow acutely per POC.    Follow Up Recommendations  CIR    Equipment Recommendations  3 in 1 bedside commode;Other (comment) (TBD)    Recommendations for Other Services Rehab consult    Precautions / Restrictions Precautions Precautions: Fall Restrictions Weight Bearing Restrictions: Yes LLE Weight Bearing:  Weight bearing as tolerated       Mobility Bed Mobility Overal bed mobility: Needs Assistance Bed Mobility: Supine to Sit;Sit to Supine     Supine to sit: HOB elevated;Mod assist Sit to supine: HOB elevated;Mod assist   General bed mobility comments: pt required MOD A +1 needing most assist to maneuver LLE during bed mobility and elevate trunk into sitting  Transfers Overall transfer level: Needs assistance Equipment used: Rolling walker (2 wheeled) Transfers: Sit to/from Stand Sit to Stand: Min assist;Mod assist         General transfer comment: pt required MIN A and cues for hand placement to power up into standing from EOB but required MODA to sit<>stand from chair with no arm rests.    Balance Overall balance assessment: Needs assistance Sitting-balance support: Feet supported;Single extremity supported Sitting balance-Leahy Scale: Fair Sitting balance - Comments: static sit EOB without support   Standing balance support: Bilateral upper extremity supported;During functional activity Standing balance-Leahy Scale: Poor Standing balance comment: external assist and UE support                            ADL either performed or assessed with clinical judgement   ADL Overall ADL's : Needs assistance/impaired     Grooming: Oral care;Sitting;Set up Grooming Details (indicate cue type and reason): pt agreeable to rinse mouth while seated in recliner, s/u assist                 Toilet Transfer: Moderate assistance;Minimal assistance;RW;Ambulation Toilet Transfer Details (indicate cue type and reason): simulated via functional mobility with needing  MIN- MOD A to power up into standing, MIN A +2 for safety for ambulation with Rw         Functional mobility during ADLs: Minimal assistance;Moderate assistance;+2 for safety/equipment;Rolling walker General ADL Comments: pt distracted by pain from stomach cramping this session but was able to complete  household distance functional mobility with Rw and MIN - MODA +2. pt continues to present with pain, decreased activity tolerance and generalized weakness     Vision Baseline Vision/History: Wears glasses Wears Glasses: At all times (pt reports wearing glassess " when hes up and moving around") Patient Visual Report: No change from baseline     Perception     Praxis      Cognition Arousal/Alertness: Awake/alert Behavior During Therapy: WFL for tasks assessed/performed Overall Cognitive Status: Impaired/Different from baseline Area of Impairment: Attention;Memory;Following commands;Safety/judgement;Awareness;Problem solving                   Current Attention Level: Sustained Memory: Decreased short-term memory Following Commands: Follows one step commands with increased time Safety/Judgement: Decreased awareness of safety;Decreased awareness of deficits Awareness: Emergent Problem Solving: Slow processing;Difficulty sequencing;Requires verbal cues;Requires tactile cues General Comments: Perseverating on stomach pain and needing immodium. pt noted to repeat statements such as concern for side effects from doing chemo and radiation but required cues to redirect pt back to session        Exercises     Shoulder Instructions       General Comments      Pertinent Vitals/ Pain       Pain Assessment: Faces Faces Pain Scale: Hurts even more Pain Location: stomach Pain Descriptors / Indicators: Grimacing;Discomfort;Cramping Pain Intervention(s): Limited activity within patient's tolerance;Monitored during session;Repositioned;Patient requesting pain meds-RN notified  Home Living                                          Prior Functioning/Environment              Frequency  Min 2X/week        Progress Toward Goals  OT Goals(current goals can now be found in the care plan section)  Progress towards OT goals: Progressing toward goals  Acute  Rehab OT Goals Patient Stated Goal: home OT Goal Formulation: With patient Time For Goal Achievement: 12/27/19 Potential to Achieve Goals: Good ADL Goals Pt Will Perform Grooming: with supervision;sitting Pt Will Perform Lower Body Bathing: with min assist;sit to/from stand Pt Will Perform Upper Body Dressing: with min guard assist;sitting Pt Will Perform Lower Body Dressing: with min assist;sit to/from stand Pt Will Transfer to Toilet: with min assist;stand pivot transfer;bedside commode (stand pivot vs ambulation) Pt Will Perform Toileting - Clothing Manipulation and hygiene: with min assist;sit to/from stand Additional ADL Goal #1: Pt will perform bed mobility with minA as precursor to EOB/OOB ADL. Additional ADL Goal #2: Pt will follow 1 step commands with >75% accuracy and no more than min cues. Additional ADL Goal #3: Pt will sustain attention to ADL/functional task >5 min with no more than min cues.  Plan Discharge plan remains appropriate;Frequency remains appropriate    Co-evaluation                 AM-PAC OT "6 Clicks" Daily Activity     Outcome Measure   Help from another person eating meals?: A Little Help from another person taking care of personal grooming?:  A Little Help from another person toileting, which includes using toliet, bedpan, or urinal?: A Lot Help from another person bathing (including washing, rinsing, drying)?: A Lot Help from another person to put on and taking off regular upper body clothing?: A Lot Help from another person to put on and taking off regular lower body clothing?: A Lot 6 Click Score: 14    End of Session Equipment Utilized During Treatment: Gait belt;Rolling walker  OT Visit Diagnosis: Other abnormalities of gait and mobility (R26.89);Other symptoms and signs involving cognitive function;Pain Pain - Right/Left: Left Pain - part of body: Hip;Knee   Activity Tolerance Patient tolerated treatment well   Patient Left in  bed;with call bell/phone within reach;with bed alarm set   Nurse Communication Mobility status;Patient requests pain meds        Time: 7408-1448 OT Time Calculation (min): 16 min  Charges: OT General Charges $OT Visit: 1 Visit  Lanier Clam., COTA/L Acute Rehabilitation Services [phone] [place] 12/15/2019, 12:16 PM

## 2019-12-15 NOTE — Discharge Summary (Signed)
Physician Discharge Summary  Carlos [name] PIR:[id number] DOB: [birth year] DOA: 12/08/2019  PCP: [name], MD  Admit date: 12/08/2019 Discharge date: 12/15/2019  Admitted From: Home Discharge disposition: CIR   Code Status: Full Code  Diet Recommendation: Regular diet  Discharge Diagnosis:   Principal Problem:   Breakthrough seizure ([place]) Active Problems:   Closed comminuted intertrochanteric fracture of left femur ([place])   Pressure injury of skin   History of Present Illness / Brief narrative:  Patientis a 74 y.o.malewith an approximately 2 year history of epilepsy(on Keppra), mild neurocognitive disorder of unclear etiology, rectal adenocarcinoma, BPH, and anxiety. Patient presented to the [facility] ED on 11/2 via EMS for a breakthrough seizure. Patient had radiation treatment on the day of admission. Familystatedhe took a nap after the treatment and was difficult to wake up. They also noticedblood in hismouth. EMS brought him to ED.  In the ED, patient was hemodynamically stable but he had seizures while in the ED.  He was started on Keppra. Unfortunately, during one of the seizure episodes, patient fell out of his bed and landed on his left hip.  Left hip x-ray showed comminuted, angulated left intratrochanteric hip fracture with avulsion of the lesser trochanter. Neurology and orthopedic consultation were obtained.   Patient was admitted to hospitalist service at [facility]. 11/5, patient underwent intramedullary fixation of left femur fracture Currently waiting for placement  Subjective:  Seen and examined this morning.  Pleasant elderly African-American male.  Lying on bed.  Not in distress.  Mental status seems intact this morning.  Hospital Course:  Breakthrough seizures History of epilepsy -Unclear trigger of breakthrough seizures. -EEG showed bitemporal spikes consistent with patient known history of seizures.  No seizures were seen  throughout the recording. -Postictal status is resolved. Neurology consult appreciated. -CT scan of head and MRI brain did not show any evidence of intracranial abnormality. -Neurology increased Keppra from 1000 mg twice daily to 1250 mg twice daily. -Outpatient neurology follow-up. -Seizure precautions at discharge.  LeftHipFracture -While having seizure in the ED, patient fell off his bed on his left side.   -X-ray of left hip showed comminuted, angulated left intratrochanteric hip fracture with avulsion of the lesser trochanter. -Orthopedic consult appreciated.  -11/5 underwent intramedullary fixation of left femur fracture. -Norco as needed for pain control. -PT recommended CIR.  Bed available today. -Per orthopedics, patient is on aspirin 81 mg twice daily for DVT prophylaxis.  I would add Protonix with aspirin because of coexisting anemia.    Acute delirium History of mild cognitive disorder -While in the hospital, he was having episodes of confusion but not agitated or restless.  Confusion likely multifactorial -hospitalization, fracture, seizure, pain medicines -Mental status improved. -Continue fall precautions. -Melatonin as needed at bedtime  Postop blood loss drop in hemoglobin -Baseline hemoglobin more than 12.  Hemoglobin postop is mostly between 8-9. -No active blood loss.  Continue to monitor. Recent Labs    09/11/19 0953 09/12/19 0546 09/12/19 1626 09/13/19 0555 12/11/19 1424 12/11/19 1424 12/12/19 0157 12/12/19 0157 12/13/19 0424 12/13/19 0424 12/14/19 0149 12/15/19 0336  HGB   < >  --  12.5*   < > 10.1*  --  9.6*  --  9.3*  --  8.9* 8.4*  MCV   < >  --  92.2   < > 93.6   < > 93.9   < > 92.6   < > 92.7 95.5  VITAMINB12  --  759  --   --   --   --   --   --   --   --   --   --  FOLATE  --  76.9  --   --   --   --   --   --   --   --   --   --   FERRITIN  --  141  --   --   --   --   --   --   --   --   --   --   TIBC  --  404  --   --   --   --   --    --   --   --   --   --   IRON  --  126  --   --   --   --   --   --   --   --   --   --   RETICCTPCT  --   --  1.2  --   --   --   --   --   --   --   --   --    < > = values in this interval not displayed.    Hypokalemia -Potassium level was low today at 3.2.  Oral replacement given. Recent Labs  Lab 12/08/19 1708 12/08/19 1753 12/09/19 0408 12/12/19 0157 12/13/19 0424 12/15/19 0336  K 3.0*  --  4.0 3.5 3.2* 3.2*  MG  --  2.6* 2.2  --   --   --    Hyperammonemia -Initially 182, and then 24 -Unclear significance of the first elevated value, suspect an error. Recent Labs  Lab 12/08/19 1708 12/08/19 2014  AMMONIA 182* 24   RectalAdenocarcinoma -Completed the course of antineoplastic agentwith capecitabine 1500 g p.o. twice daily. -continue with lidocaine suppository 50 mg rectally every 8 hours as needed for pain -Continue to monitor -Oncology follow-up appreciated.  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 -On Imodium as needed.  Stable for discharge to CIR today.   Wound care: Incision (Closed) 07/01/19 Chest Right (Active)  Date First Assessed/Time First Assessed: 07/01/19 0935   Location: Chest  Location Orientation: Right    Assessments 07/01/2019  9:49 AM 07/01/2019 11:16 AM  Dressing Type Abdominal pads;Gauze (Comment);Tape dressing Abdominal pads;Gauze (Comment);Tape dressing  Dressing Clean;Dry;Intact Clean;Dry;Intact  Site / Wound Assessment Dressing in place / Unable to assess Dressing in place / Unable to assess  Drainage Amount None None     No Linked orders to display     Incision (Closed) 12/11/19 Leg Left (Active)  Date First Assessed/Time First Assessed: 12/11/19 0820   Location: Leg  Location Orientation: Left    Assessments 12/11/2019  9:07 AM 12/14/2019  7:50 AM  Dressing Type Hydrocolloid Hydrocolloid  Dressing Clean;Dry;Intact Intact  Site / Wound Assessment Dressing in place / Unable to  assess Dressing in place / Unable to assess  Drainage Amount None --     No Linked orders to display     Pressure Injury 12/12/19 Buttocks Right Stage 2 -  Partial thickness loss of dermis presenting as a shallow open injury with a red, pink wound bed without slough. (Active)  Date First Assessed/Time First Assessed: 12/12/19 1030   Location: Buttocks  Location Orientation: Right  Staging: Stage 2 -  Partial thickness loss of dermis presenting as a shallow open injury with a red, pink wound bed without slough.    Assessments 12/12/2019  8:00 AM 12/14/2019  7:50 AM  Dressing Type Gauze (Comment) Foam - Lift dressing to  assess site every shift  Dressing Reinforced Intact  Dressing Change Frequency -- PRN  State of Healing -- Early/partial granulation  Site / Wound Assessment -- Clean;Dry;Pink  Wound Length (cm) 4 cm --  Wound Width (cm) 2 cm --  Wound Surface Area (cm^2) 8 cm^2 --  Margins -- Unattached edges (unapproximated)  Drainage Amount Scant Scant  Drainage Description -- No odor;Serous  Treatment -- Cleansed     No Linked orders to display    Discharge Exam:   Vitals:   12/14/19 2025 12/15/19 0002 12/15/19 0444 12/15/19 0737  BP: 121/70 99/65 108/62 (!) 104/54  Pulse: 76 81 73 71  Resp: 18 18 18 20   Temp: 98.2 F (36.8 C) 97.9 F (36.6 C) 98.5 F (36.9 C) 98.5 F (36.9 C)  TempSrc: Oral Oral Oral Oral  SpO2: 100% 100% 100% 100%  Weight:      Height:        Body mass index is 20.83 kg/m.  General exam: Appears calm and comfortable.  Not in physical distress Skin: No rashes, lesions or ulcers. HEENT: Atraumatic, normocephalic, supple neck, no obvious bleeding Lungs: Clear to auscultation bilaterally CVS: Regular rate and rhythm, no murmur GI/Abd soft, nontender, nondistended, bowel sound present CNS: Alert, awake, oriented x3 Psychiatry: Mood appropriate Extremities: No edema, no calf tenderness  Follow ups:   Discharge Instructions    Diet general    Complete by: As directed    If the dressing is still on your incision site when you go home, remove it on the third day after your surgery date. Remove dressing if it begins to fall off, or if it is dirty or damaged before the third day.   Complete by: As directed    Increase activity slowly   Complete by: As directed       Follow-up Information    Swinteck, Aaron Edelman, MD. Schedule an appointment as soon as possible for a visit in 2 weeks.   Specialty: Orthopedic Surgery Why: For wound re-check, For suture removal Contact information: [address] [place] [address] [phone]        [name], MD Follow up.   Specialty: Family Medicine Contact information: [address] [place] [address] [phone]               Recommendations for Outpatient Follow-Up:   1. Follow-up with PCP as an outpatient 2. Follow-up with orthopedics as an outpatient  Discharge Instructions:  Follow with Primary MD [name], MD in 7 days   Get CBC/BMP checked in next visit within 1 week by PCP or SNF MD ( we routinely change or add medications that can affect your baseline labs and fluid status, therefore we recommend that you get the mentioned basic workup next visit with your PCP, your PCP may decide not to get them or add new tests based on their clinical decision)  On your next visit with your PCP, please Get Medicines reviewed and adjusted.  Please request your PCP  to go over all Hospital Tests and Procedure/Radiological results at the follow up, please get all Hospital records sent to your Prim MD by signing hospital release before you go home.  Activity: As tolerated with Full fall precautions use walker/cane & assistance as needed  For Heart failure patients - Check your Weight same time everyday, if you gain over 2 pounds, or you develop in leg swelling, experience more shortness of breath or chest pain, call your Primary  MD  immediately. Follow Cardiac Low Salt Diet and 1.5 lit/day fluid restriction.  If you have smoked or chewed Tobacco in the last 2 yrs please stop smoking, stop any regular Alcohol  and or any Recreational drug use.  If you experience worsening of your admission symptoms, develop shortness of breath, life threatening emergency, suicidal or homicidal thoughts you must seek medical attention immediately by calling 911 or calling your MD immediately  if symptoms less severe.  You Must read complete instructions/literature along with all the possible adverse reactions/side effects for all the Medicines you take and that have been prescribed to you. Take any new Medicines after you have completely understood and accpet all the possible adverse reactions/side effects.   Do not drive, operate heavy machinery, perform activities at heights, swimming or participation in water activities or provide baby sitting services if your were admitted for syncope or siezures until you have seen by Primary MD or a Neurologist and advised to do so again.  Do not drive when taking Pain medications.  Do not take more than prescribed Pain, Sleep and Anxiety Medications  Wear Seat belts while driving.   Please note You were cared for by a hospitalist during your hospital stay. If you have any questions about your discharge medications or the care you received while you were in the hospital after you are discharged, you can call the unit and asked to speak with the hospitalist on call if the hospitalist that took care of you is not available. Once you are discharged, your primary care physician will handle any further medical issues. Please note that NO REFILLS for any discharge medications will be authorized once you are discharged, as it is imperative that you return to your primary care physician (or establish a relationship with a primary care physician if you do not have one) for your aftercare needs so that they can  reassess your need for medications and monitor your lab values.    Allergies as of 12/15/2019   No Known Allergies     Medication List    STOP taking these medications   capecitabine 500 MG tablet Commonly known as: XELODA   HYDROcodone-Acetaminophen 5-300 MG Tabs Replaced by: HYDROcodone-acetaminophen 5-325 MG tablet   OVER THE COUNTER MEDICATION   potassium chloride SA 20 MEQ tablet Commonly known as: KLOR-CON   traMADol 50 MG tablet Commonly known as: Ultram     TAKE these medications   aspirin 81 MG chewable tablet Commonly known as: Aspirin Childrens Chew 1 tablet (81 mg total) by mouth 2 (two) times daily with a meal.   bismuth subsalicylate 440 NU/27OZ suspension Commonly known as: PEPTO BISMOL Take 30 mLs by mouth every 6 (six) hours as needed for indigestion.   docusate sodium 100 MG capsule Commonly known as: COLACE Take 1 capsule (100 mg total) by mouth 2 (two) times daily.   HYDROcodone-acetaminophen 5-325 MG tablet Commonly known as: NORCO/VICODIN Take 1 tablet by mouth every 4 (four) hours as needed for moderate pain. Replaces: HYDROcodone-Acetaminophen 5-300 MG Tabs   levETIRAcetam 250 MG tablet Commonly known as: Keppra Take 5 tablets (1,250 mg total) by mouth 2 (two) times daily. What changed:   medication strength  how much to take   Lidocaine (Anorectal) 50 MG Supp Place 50 mg rectally every 8 (eight) hours as needed.   lidocaine-prilocaine cream Commonly known as: EMLA Apply 1 application topically daily as needed (pain).   loperamide 2 MG capsule Commonly known as: IMODIUM Take 1  capsule (2 mg total) by mouth every 6 (six) hours as needed for diarrhea or loose stools.   melatonin 3 MG Tabs tablet Take 1 tablet (3 mg total) by mouth at bedtime as needed (insomnia).   methocarbamol 500 MG tablet Commonly known as: ROBAXIN Take 1 tablet (500 mg total) by mouth every 6 (six) hours as needed for muscle spasms.   metoCLOPramide 5 MG  tablet Commonly known as: REGLAN Take 1-2 tablets (5-10 mg total) by mouth every 8 (eight) hours as needed for nausea (if ondansetron (ZOFRAN) ineffective.).   multivitamin with minerals Tabs tablet Take 1 tablet by mouth daily.   ondansetron 4 MG tablet Commonly known as: ZOFRAN Take 1 tablet (4 mg total) by mouth every 6 (six) hours as needed for nausea.   pantoprazole 40 MG tablet Commonly known as: PROTONIX Take 1 tablet (40 mg total) by mouth daily. Start taking on: December 16, 2019   tamsulosin 0.4 MG Caps capsule Commonly known as: FLOMAX Take 1 capsule (0.4 mg total) by mouth daily. Start taking on: December 16, 2019            Discharge Care Instructions  (From admission, onward)         Start     Ordered   12/15/19 0000  If the dressing is still on your incision site when you go home, remove it on the third day after your surgery date. Remove dressing if it begins to fall off, or if it is dirty or damaged before the third day.        12/15/19 1200          Time coordinating discharge: 35 minutes  The results of significant diagnostics from this hospitalization (including imaging, microbiology, ancillary and laboratory) are listed below for reference.    Procedures and Diagnostic Studies:   DG Chest 1 View  Result Date: 12/09/2019 CLINICAL DATA:  Preop hip fracture EXAM: CHEST  1 VIEW COMPARISON:  09/11/2019 FINDINGS: Right Port-A-Cath remains in place, unchanged. Heart is normal size. Lungs are clear. No effusions. No acute bony abnormality. IMPRESSION: No active disease. Electronically Signed   By: Rolm Baptise M.D.   On: 12/09/2019 01:31   MR BRAIN WO CONTRAST  Result Date: 12/09/2019 CLINICAL DATA:  Seizure.  Abnormal neuro exam. EXAM: MRI HEAD WITHOUT CONTRAST TECHNIQUE: Multiplanar, multiecho pulse sequences of the brain and surrounding structures were obtained without intravenous contrast. COMPARISON:  CT head 11/07/2019.  MRI head 09/11/2019.  FINDINGS: Brain: No acute infarction, hemorrhage, hydrocephalus, extra-axial collection or mass lesion. Minimal T2/FLAIR hyperintensities within the white matter, likely related to chronic microvascular ischemic disease. Mild generalized cerebral atrophy with ex vacuo ventricular dilation. Small remote left cerebellar lacunar infarct with focus of susceptibility in this region likely related to prior hemorrhage. Vascular: Major arterial flow voids are maintained at the skull base. Skull and upper cervical spine: Normal marrow signal. Sinuses/Orbits: Minimal paranasal sinus mucosal thickening. Unremarkable orbits. Other: No mastoid effusions. IMPRESSION: No evidence of acute intracranial abnormality. Electronically Signed   By: Margaretha Sheffield MD   On: 12/09/2019 14:11   DG Knee Left Port  Result Date: 12/09/2019 CLINICAL DATA:  Left femur fracture. EXAM: PORTABLE LEFT KNEE - 1-2 VIEW COMPARISON:  None. FINDINGS: No evidence of fracture, dislocation, or joint effusion. Severe narrowing of medial joint space is noted. Soft tissues are unremarkable. 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 No acute abnormality seen in the left knee. Electronically Signed   By: Jeneen Rinks  Murlean Caller M.D.   On: 12/09/2019 12:32   EEG adult  Result Date: 12/09/2019 Lora Havens, MD     12/09/2019  1:57 PM Patient Name: Carlos [name] MRN: [id number] Epilepsy Attending: Lora Havens Referring Physician/Provider: Reed Pandy Date: 12/09/2019 Duration: 25.42 mins Patient history: 74 year old male with history of epilepsy who presented with breakthrough seizure.  EEG to evaluate with seizures. Level of alertness: Awake, asleep AEDs during EEG study: Keppra Technical aspects: This EEG study was done with scalp electrodes positioned according to the 10-20 International system of electrode placement. Electrical activity was acquired at a sampling rate of 500Hz  and reviewed with a high frequency filter of  70Hz  and a low frequency filter of 1Hz . EEG data were recorded continuously and digitally stored. Description: The posterior dominant rhythm consists of 8 Hz activity of moderate voltage (25-35 uV) seen predominantly in posterior head regions, symmetric and reactive to eye opening and eye closing.Sleep was characterized by vertex waves, sleep spindles (12 to 14 Hz), maximal frontocentral region.   EEG showed independent left frontotemporal (maximal Fp1, F7) and right anterior temporal (maximal F8) spikes.  Hyperventilation and photic stimulation were not performed.  ABNORMALITY -Spikes, independent left and right temporal. IMPRESSION: This study is consistent with patient's known history of independent left and right temporal spikes (maximal Fp1, F7 and F8). No seizures were seen throughout the recording. Lora Havens   DG Hip Unilat W or Wo Pelvis 2-3 Views Left  Result Date: 12/09/2019 CLINICAL DATA:  Fall, left hip pain EXAM: DG HIP (WITH OR WITHOUT PELVIS) 2-3V LEFT COMPARISON:  None. FINDINGS: Single view radiograph pelvis and two view radiograph left hip demonstrates an acute, mildly comminuted intratrochanteric fracture of the left hip with avulsion of the lesser trochanter, mild override of the major fracture fragments, and mild varus angulation. The femoral head is still seated within the left acetabulum. There is mild superimposed bilateral degenerative hip arthritis with joint space narrowing noted. The pelvis and sacrum are intact. Limited evaluation of the right hip is unremarkable. IMPRESSION: Comminuted, angulated left intratrochanteric hip fracture with avulsion of the lesser trochanter. Electronically Signed   By: Fidela Salisbury MD   On: 12/09/2019 00:11     Labs:   Basic Metabolic Panel: Recent Labs  Lab 12/08/19 1708 12/08/19 1708 12/08/19 1753 12/09/19 0408 12/09/19 0408 12/11/19 1424 12/12/19 0157 12/12/19 0157 12/13/19 0424 12/15/19 0336  NA 145  --   --  138  --   --   137  --  137 139  K 3.0*   < >  --  4.0   < >  --  3.5   < > 3.2* 3.2*  CL 102  --   --  108  --   --  101  --  101 105  CO2 17*  --   --  23  --   --  27  --  25 25  GLUCOSE 136*  --   --  135*  --   --  111*  --  109* 104*  BUN 13  --   --  14  --   --  10  --  13 9  CREATININE 0.92   < >  --  0.70  --  0.66 0.68  --  0.59* 0.59*  CALCIUM 10.0  --   --  8.5*  --   --  8.5*  --  8.3* 8.2*  MG  --   --  2.6* 2.2  --   --   --   --   --   --    < > = values in this interval not displayed.   GFR Estimated Creatinine Clearance: 69.1 mL/min (A) (by C-G formula based on SCr of 0.59 mg/dL (L)). Liver Function Tests: Recent Labs  Lab 12/08/19 1708 12/09/19 0408  AST 36 28  ALT 28 26  ALKPHOS 92 65  BILITOT 0.8 0.5  PROT 8.4* 6.3*  ALBUMIN 4.9 3.9   No results for input(s): LIPASE, AMYLASE in the last 168 hours. Recent Labs  Lab 12/08/19 1708 12/08/19 2014  AMMONIA 182* 24   Coagulation profile No results for input(s): INR, PROTIME in the last 168 hours.  CBC: Recent Labs  Lab 12/08/19 1708 12/08/19 1708 12/09/19 0408 12/09/19 0408 12/11/19 1424 12/12/19 0157 12/13/19 0424 12/14/19 0149 12/15/19 0336  WBC 5.8   < > 8.1   < > 8.4 7.9 5.9 6.3 4.7  NEUTROABS 4.2  --  7.0  --   --   --   --   --  3.4  HGB 13.3   < > 10.8*   < > 10.1* 9.6* 9.3* 8.9* 8.4*  HCT 43.0   < > 32.9*   < > 30.5* 29.3* 27.6* 26.6* 25.6*  MCV 99.8   < > 94.8   < > 93.6 93.9 92.6 92.7 95.5  PLT 229   < > 161   < > 110* 113* 148* 161 153   < > = values in this interval not displayed.   Cardiac Enzymes: No results for input(s): CKTOTAL, CKMB, CKMBINDEX, TROPONINI in the last 168 hours. BNP: Invalid input(s): POCBNP CBG: Recent Labs  Lab 12/08/19 1746  GLUCAP 117*   D-Dimer No results for input(s): DDIMER in the last 72 hours. Hgb A1c No results for input(s): HGBA1C in the last 72 hours. Lipid Profile No results for input(s): CHOL, HDL, LDLCALC, TRIG, CHOLHDL, LDLDIRECT in the last 72  hours. Thyroid function studies No results for input(s): TSH, T4TOTAL, T3FREE, THYROIDAB in the last 72 hours.  Invalid input(s): FREET3 Anemia work up No results for input(s): VITAMINB12, FOLATE, FERRITIN, TIBC, IRON, RETICCTPCT in the last 72 hours. Microbiology Recent Results (from the past 240 hour(s))  Respiratory Panel by RT PCR (Flu A&B, Covid) - Nasopharyngeal Swab     Status: None   Collection Time: 12/08/19  9:17 PM   Specimen: Nasopharyngeal Swab  Result Value Ref Range Status   SARS Coronavirus 2 by RT PCR NEGATIVE NEGATIVE Final    Comment: (NOTE) SARS-CoV-2 target nucleic acids are NOT DETECTED.  The SARS-CoV-2 RNA is generally detectable in upper respiratoy specimens during the acute phase of infection. The lowest concentration of SARS-CoV-2 viral copies this assay can detect is 131 copies/mL. A negative result does not preclude SARS-Cov-2 infection and should not be used as the sole basis for treatment or other patient management decisions. A negative result may occur with  improper specimen collection/handling, submission of specimen other than nasopharyngeal swab, presence of viral mutation(s) within the areas targeted by this assay, and inadequate number of viral copies (<131 copies/mL). A negative result must be combined with clinical observations, patient history, and epidemiological information. The expected result is Negative.  Fact Sheet for Patients:  PinkCheek.be  Fact Sheet for Healthcare Providers:  GravelBags.it  This test is no t yet approved or cleared by the [place] FDA and  has been authorized for detection and/or diagnosis of SARS-CoV-2 by  FDA under an Emergency Use Authorization (EUA). This EUA will remain  in effect (meaning this test can be used) for the duration of the COVID-19 declaration under Section 564(b)(1) of the Act, 21 U.S.C. section 360bbb-3(b)(1), unless the  authorization is terminated or revoked sooner.     Influenza A by PCR NEGATIVE NEGATIVE Final   Influenza B by PCR NEGATIVE NEGATIVE Final    Comment: (NOTE) The Xpert Xpress SARS-CoV-2/FLU/RSV assay is intended as an aid in  the diagnosis of influenza from Nasopharyngeal swab specimens and  should not be used as a sole basis for treatment. Nasal washings and  aspirates are unacceptable for Xpert Xpress SARS-CoV-2/FLU/RSV  testing.  Fact Sheet for Patients: PinkCheek.be  Fact Sheet for Healthcare Providers: GravelBags.it  This test is not yet approved or cleared by the [place] FDA and  has been authorized for detection and/or diagnosis of SARS-CoV-2 by  FDA under an Emergency Use Authorization (EUA). This EUA will remain  in effect (meaning this test can be used) for the duration of the  Covid-19 declaration under Section 564(b)(1) of the Act, 21  U.S.C. section 360bbb-3(b)(1), unless the authorization is  terminated or revoked. Performed at [facility] [address], [place], [place] [address]      Signed: Marlowe Aschoff [name]  [facility] 12/15/2019, 12:00 PM

## 2019-12-15 NOTE — Progress Notes (Signed)
Patient has multiple denuded blisters on both buttocks. Wound base with beefy red tissue with redundant circumferential tissue.

## 2019-12-15 NOTE — Progress Notes (Addendum)
   Subjective: 4 Days Post-Op Procedure(s) (LRB): INTRAMEDULLARY (IM) NAIL INTERTROCHANTRIC (Left) Patient reports pain as mild.   Patient is well, and has had no acute complaints or problems  Denies CP, SHOB, N/V.    Objective: Vital signs in last 24 hours: Temp:  [97.9 F (36.6 C)-98.5 F (36.9 C)] 98.5 F (36.9 C) (11/09 0737) Pulse Rate:  [71-89] 71 (11/09 0737) Resp:  [18-20] 20 (11/09 0737) BP: (99-121)/(54-70) 104/54 (11/09 0737) SpO2:  [100 %] 100 % (11/09 0737)  Intake/Output from previous day:  Intake/Output Summary (Last 24 hours) at 12/15/2019 1049 Last data filed at 12/15/2019 0859 Gross per 24 hour  Intake 360 ml  Output 975 ml  Net -615 ml     Intake/Output this shift: Total I/O In: 120 [P.O.:120] Out: -   Labs: Recent Labs    12/13/19 0424 12/14/19 0149 12/15/19 0336  HGB 9.3* 8.9* 8.4*   Recent Labs    12/14/19 0149 12/15/19 0336  WBC 6.3 4.7  RBC 2.87* 2.68*  HCT 26.6* 25.6*  PLT 161 153   Recent Labs    12/13/19 0424 12/15/19 0336  NA 137 139  K 3.2* 3.2*  CL 101 105  CO2 25 25  BUN 13 9  CREATININE 0.59* 0.59*  GLUCOSE 109* 104*  CALCIUM 8.3* 8.2*   No results for input(s): LABPT, INR in the last 72 hours.  Exam: General - Patient is Alert and Appropriate Extremity - Neurologically intact Intact pulses distally Dorsiflexion/Plantar flexion intact Incision: dressing C/D/I Dressing - dressing C/D/I Motor Function - intact, moving foot and toes well on exam.   Past Medical History:  Diagnosis Date  . Anxiety   . Benign prostatic hyperplasia 03/30/2013   10/1 IMO update  . Cancer ([place])   . 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 Seizures (HCC)     Assessment/Plan: 4 Days Post-Op Procedure(s) (LRB): INTRAMEDULLARY (IM) NAIL INTERTROCHANTRIC (Left) Principal Problem:   Breakthrough seizure ([place]) Active Problems:   Closed comminuted intertrochanteric fracture of left femur (HCC)   Pressure  injury of skin  Estimated body mass index is 20.83 kg/m as calculated from the following:   Height as of this encounter: 5\' 7"  (1.702 m).   Weight as of this encounter: 60.3 kg. Advance diet Up with therapy  DVT Prophylaxis - Aspirin Weight bearing as tolerated.  Hemoglobin stable at 9.3 this AM.   PT recommends CIR and patient agrees. Dressing to remain in place until follow up.   Cherlynn June, PA-C Orthopedic Surgery [phone] 12/15/2019, 10:49 AM

## 2019-12-15 NOTE — Progress Notes (Signed)
Inpatient Rehabilitation Medication Review by a Pharmacist  A complete drug regimen review was completed for this patient to identify any potential clinically significant medication issues.  Clinically significant medication issues were identified:  yes   Type of Medication Issue Identified Description of Issue Urgent (address now) Non-Urgent (address on AM team rounds) Plan Plan Accepted by Provider? (Yes / No / Pending AM Rounds)  Drug Interaction(s) (clinically significant)       Duplicate Therapy       Allergy       No Medication Administration End Date       Incorrect Dose  ASA 325 mg/day was started by the [facility] MD on 11/9 for VTE prophylaxis after the recent L-hip fracture repair. Upon discharge the dose was clarified with ortho to be 81 mg bid instead in addition to a PPI. I contacted ortho to confirm they desired the 81 mg bid dosing. Non-urgent Adjust ASA 325 mg/day to 81 mg bid per ortho's recommendations Yes  Additional Drug Therapy Needed       Other  Lidocaine prn suppository ordered from the patient's PTA med list however was noted that the patient was "not taking". The patient did not use during their inpatient stay and we do no carry this particular medication Non-urgent D/c prn lidocaine suppository Yes    Name of provider notified for urgent issues identified: Marlowe Shores  Provider Method of Notification: Phone  For non-urgent medication issues to be resolved on team rounds tomorrow morning a [facility] [place] was sent to:   Pharmacist comments: Recommendations were discussed and accepted. Will d/c the prn lidocaine suppository and plan to address rectal pain as it arises with things that we do carry/stock.   Time spent performing this drug regimen review (minutes):  25 minutes  Alycia Rossetti, PharmD, BCPS Clinical Pharmacist 12/15/2019 4:07 PM

## 2019-12-16 ENCOUNTER — Inpatient Hospital Stay (HOSPITAL_COMMUNITY): Payer: 59

## 2019-12-16 ENCOUNTER — Inpatient Hospital Stay (HOSPITAL_COMMUNITY): Payer: 59 | Admitting: Occupational Therapy

## 2019-12-16 ENCOUNTER — Inpatient Hospital Stay (HOSPITAL_COMMUNITY): Payer: 59 | Admitting: Physical Therapy

## 2019-12-16 DIAGNOSIS — E8809 Other disorders of plasma-protein metabolism, not elsewhere classified: Secondary | ICD-10-CM

## 2019-12-16 DIAGNOSIS — S72145A Nondisplaced intertrochanteric fracture of left femur, initial encounter for closed fracture: Secondary | ICD-10-CM

## 2019-12-16 DIAGNOSIS — M7989 Other specified soft tissue disorders: Secondary | ICD-10-CM

## 2019-12-16 DIAGNOSIS — R195 Other fecal abnormalities: Secondary | ICD-10-CM

## 2019-12-16 DIAGNOSIS — R569 Unspecified convulsions: Secondary | ICD-10-CM | POA: Diagnosis not present

## 2019-12-16 DIAGNOSIS — E46 Unspecified protein-calorie malnutrition: Secondary | ICD-10-CM

## 2019-12-16 DIAGNOSIS — S72145D Nondisplaced intertrochanteric fracture of left femur, subsequent encounter for closed fracture with routine healing: Principal | ICD-10-CM

## 2019-12-16 DIAGNOSIS — E876 Hypokalemia: Secondary | ICD-10-CM

## 2019-12-16 DIAGNOSIS — D62 Acute posthemorrhagic anemia: Secondary | ICD-10-CM

## 2019-12-16 LAB — URINALYSIS, ROUTINE W REFLEX MICROSCOPIC
Bilirubin Urine: NEGATIVE
Glucose, UA: NEGATIVE mg/dL
Ketones, ur: NEGATIVE mg/dL
Leukocytes,Ua: NEGATIVE
Nitrite: NEGATIVE
Protein, ur: NEGATIVE mg/dL
RBC / HPF: >50 RBC/hpf — ABNORMAL HIGH (ref 0–5)
Specific Gravity, Urine: 1.017 (ref 1.005–1.030)
pH: 5 (ref 5.0–8.0)

## 2019-12-16 LAB — COMPREHENSIVE METABOLIC PANEL
ALT: 33 U/L (ref 0–44)
AST: 33 U/L (ref 15–41)
Albumin: 2.6 g/dL — ABNORMAL LOW (ref 3.5–5.0)
Alkaline Phosphatase: 55 U/L (ref 38–126)
Anion gap: 7 (ref 5–15)
BUN: 10 mg/dL (ref 8–23)
CO2: 27 mmol/L (ref 22–32)
Calcium: 8.3 mg/dL — ABNORMAL LOW (ref 8.9–10.3)
Chloride: 105 mmol/L (ref 98–111)
Creatinine, Ser: 0.62 mg/dL (ref 0.61–1.24)
GFR, Estimated: >60 mL/min (ref >60)
Glucose, Bld: 100 mg/dL — ABNORMAL HIGH (ref 70–99)
Potassium: 3.3 mmol/L — ABNORMAL LOW (ref 3.5–5.1)
Sodium: 139 mmol/L (ref 135–145)
Total Bilirubin: 1.1 mg/dL (ref 0.3–1.2)
Total Protein: 5.1 g/dL — ABNORMAL LOW (ref 6.5–8.1)

## 2019-12-16 LAB — CBC WITH DIFFERENTIAL/PLATELET
Abs Immature Granulocytes: 0.14 10*3/uL — ABNORMAL HIGH (ref 0.00–0.07)
Basophils Absolute: 0 10*3/uL (ref 0.0–0.1)
Basophils Relative: 0 %
Eosinophils Absolute: 0.2 10*3/uL (ref 0.0–0.5)
Eosinophils Relative: 3 %
HCT: 26 % — ABNORMAL LOW (ref 39.0–52.0)
Hemoglobin: 8.5 g/dL — ABNORMAL LOW (ref 13.0–17.0)
Immature Granulocytes: 3 %
Lymphocytes Relative: 7 %
Lymphs Abs: 0.4 10*3/uL — ABNORMAL LOW (ref 0.7–4.0)
MCH: 31.1 pg (ref 26.0–34.0)
MCHC: 32.7 g/dL (ref 30.0–36.0)
MCV: 95.2 fL (ref 80.0–100.0)
Monocytes Absolute: 0.8 10*3/uL (ref 0.1–1.0)
Monocytes Relative: 14 %
Neutro Abs: 4.2 10*3/uL (ref 1.7–7.7)
Neutrophils Relative %: 73 %
Platelets: 196 10*3/uL (ref 150–400)
RBC: 2.73 MIL/uL — ABNORMAL LOW (ref 4.22–5.81)
RDW: 16 % — ABNORMAL HIGH (ref 11.5–15.5)
WBC: 5.7 10*3/uL (ref 4.0–10.5)
nRBC: 0 % (ref 0.0–0.2)

## 2019-12-16 MED ORDER — CALCIUM POLYCARBOPHIL 625 MG PO TABS
625.0000 mg | ORAL_TABLET | Freq: Every day | ORAL | Status: DC
  Administered 2019-12-16 – 2019-12-18 (×3): 625 mg via ORAL
  Filled 2019-12-16 (×3): qty 1

## 2019-12-16 MED ORDER — PROSOURCE PLUS PO LIQD
30.0000 mL | Freq: Two times a day (BID) | ORAL | Status: DC
  Administered 2019-12-16 – 2019-12-29 (×24): 30 mL via ORAL
  Filled 2019-12-16 (×23): qty 30

## 2019-12-16 MED ORDER — POTASSIUM CHLORIDE CRYS ER 20 MEQ PO TBCR
20.0000 meq | EXTENDED_RELEASE_TABLET | Freq: Every day | ORAL | Status: DC
  Administered 2019-12-16 – 2019-12-18 (×3): 20 meq via ORAL
  Filled 2019-12-16 (×3): qty 1

## 2019-12-16 NOTE — Progress Notes (Signed)
Inpatient Rehabilitation  Patient information reviewed and entered into eRehab system by Kamran Coker M. Johnthomas Lader, M.A., CCC/SLP, PPS Coordinator.  Information including medical coding, functional ability and quality indicators will be reviewed and updated through discharge.    

## 2019-12-16 NOTE — Consult Note (Signed)
[place] Nurse Consult Note: Reason for Consult:Bilateral buttock lesions. R>L  See photo provided and uploaded to the EMR by a previous provider. Wound type: trauma (friction), pressure Pressure Injury POA: Yes Measurement: Right buttock: 4.4cm x 6cm x 0.1cm (two areas within this measurement) Left Buttock: 4.4cm x 4cm x 0.1cm (two areas within this measurement) Wound bed: pink, moist Drainage (amount, consistency, odor) scant serous Periwound: intact, macerated. Epidermis is rolled and pushed to periphery of wound on both wound, > on right. Dressing procedure/placement/frequency: Patient is independent in turning and repositioning. He is wearing an adult brief and has been incontinence of urine at the time of my assessment. The buttock lesions appear traumatic (friction) related rather than pressure, but are partial thickness.  Guidance for topical care is adjusted slightly from interim orders placed yesterday and instead of silver hydrofiber (Aquqacel Advantage) which is used for wounds with more exudate than currently exists from these wounds, we will change to xeroform gauze. This will still lend the antimicrobial features desired, but in a nonadherent and moisture retentive manner which should lend itself to enhanced opportunity for reepithelialization. Recommend side to side positioning while in bed. Remove adult briefs as able during day and at night in favor of an external urinary incontinence collection device.  [facility] nursing team will not follow, but will remain available to this patient, the nursing and medical teams.  Please re-consult if needed. Thanks, Maudie Flakes, MSN, RN, [place], Arther Abbott  Pager# [phone]

## 2019-12-16 NOTE — IPOC Note (Signed)
Individualized overall Plan of Care [facility]) Patient Details Name: Carlos [name] MRN: [id number] DOB: 03/05/1945  Admitting Diagnosis: Closed nondisplaced intertrochanteric fracture of left femur [facility])  Hospital Problems: Principal Problem:   Closed nondisplaced intertrochanteric fracture of left femur ([place]) Active Problems:   Seizure ([place])   Loose stools   Hypoalbuminemia due to protein-calorie malnutrition ([place])   Hypokalemia   Acute blood loss anemia   Seizures (HCC)     Functional Problem List: Nursing Bladder, Bowel, Skin Integrity, Sensory, Safety, Perception, Medication Management, Endurance, Motor  PT Balance, Endurance, Motor, Pain, Skin Integrity  OT Balance, Cognition, Endurance, Motor, Pain, Safety  SLP    TR         Basic ADL's: OT Grooming, Bathing, Dressing, Toileting     Advanced  ADL's: OT       Transfers: PT Bed Mobility, Bed to Chair, Car, Manufacturing systems engineer, Metallurgist: PT Ambulation, Emergency planning/management officer, Stairs     Additional Impairments: OT None  SLP        TR      Anticipated Outcomes Item Anticipated Outcome  Self Feeding    Swallowing      Basic self-care  Media planner Transfers Supervision  Bowel/Bladder  Remain continent of bowel/bladder with MOd I  Transfers  supervision with LRAD  Locomotion  supervision with LRAD  Communication     Cognition     Pain  no pain  Safety/Judgment  Able to call for help and express need   Therapy Plan: PT Intensity: Minimum of 1-2 x/day ,45 to 90 minutes PT Frequency: 5 out of 7 days PT Duration Estimated Length of Stay: 10-12 days OT Intensity: Minimum of 1-2 x/day, 45 to 90 minutes OT Frequency: 5 out of 7 days OT Duration/Estimated Length of Stay: 10-12 days      Team Interventions: Nursing Interventions Patient/Family Education, Bladder Management, Bowel Management, Disease Management/Prevention, Medication Management,  Skin Care/Wound Management  PT interventions Ambulation/gait training, Discharge planning, Functional mobility training, Psychosocial support, Therapeutic Activities, Balance/vestibular training, Disease management/prevention, Neuromuscular re-education, Skin care/wound management, Therapeutic Exercise, Wheelchair propulsion/positioning, Cognitive remediation/compensation, DME/adaptive equipment instruction, Pain management, UE/LE Strength taining/ROM, Community reintegration, Technical sales engineer stimulation, Patient/family education, IT trainer, UE/LE Coordination activities  OT Interventions Training and development officer, Cognitive remediation/compensation, Academic librarian, Discharge planning, Disease mangement/prevention, Engineer, drilling, Functional mobility training, Pain management, Patient/family education, Psychosocial support, Self Care/advanced ADL retraining, Skin care/wound managment, Therapeutic Activities, Therapeutic Exercise, UE/LE Strength taining/ROM  SLP Interventions    TR Interventions    SW/CM Interventions Discharge Planning, Psychosocial Support, Patient/Family Education   Barriers to Discharge MD  Medical stability and loose stools  Nursing      PT Decreased caregiver support, Wound Care, Other (comments) wounds on buttocks, cognitive deficits, unsure how much assist pt will have at home  OT Decreased caregiver support, Home environment access/layout, Incontinence, Wound Care    SLP      SW Decreased caregiver support Caregivers are for his Mom not him   Team Discharge Planning: Destination: PT-Home ,OT- Home , SLP-  Projected Follow-up: PT-Home health PT, OT-  Home health OT, SLP-  Projected Equipment Needs: PT-To be determined, OT- Tub/shower bench, SLP-  Equipment Details: PT- , OT-  Patient/family involved in discharge planning: PT- Patient,  OT-Patient, SLP-   MD ELOS: 10-12 days. Medical Rehab Prognosis:  Excellent Assessment:  74 year old right-handed male with history of epilepsy maintained on Keppra, rectal adenocarcinoma maintained on  Xeloda receiving radiation therapy followed by oncology services Dr.Feng as well as radiation oncology Dr. Lisbeth Renshaw, BPH, anxiety as well as history of mild cognitive disorder. Presented 12/08/2019 to [facility] long hospital with reported breakthrough seizure after radiation treatment.  He was treated with Ativan IM and loaded with Keppra.  Cranial CT scan negative.  Admission chemistries potassium 3.0 glucose 136, hemoglobin 12.2 WBC 3.7, ammonia level 182 question false positive and repeated 24, lactic acid 1.9.  EEG with no seizure.  While patient was at [facility] long ED he did sustain another seizure and fell out of the bed without loss of consciousness landing on his left hip.  X-rays and imaging revealed left intertrochanteric femur fracture.  Patient underwent intramedullary fixation 12/11/2019 per Dr. Lyla Glassing.  Weightbearing as tolerated.  Maintained on aspirin 325 mg daily for DVT prophylaxis.  Acute blood loss anemia monitored.  Patient with resulting functional deficits with mobility, transfers, endurance, and self-care.  Will set goals for Supervision with PT/OT.   Due to the current state of emergency, patients may not be receiving their 3-hours of Medicare-mandated therapy.  See Team Conference Notes for weekly updates to the plan of care

## 2019-12-16 NOTE — Progress Notes (Signed)
[place] Individual Statement of Services  Patient Name:  Carlos [name]  Date:  12/16/2019  Welcome to the [facility].  Our goal is to provide you with an individualized program based on your diagnosis and situation, designed to meet your specific needs.  With this comprehensive rehabilitation program, you will be expected to participate in at least 3 hours of rehabilitation therapies Monday-Friday, with modified therapy programming on the weekends.  Your rehabilitation program will include the following services:  Physical Therapy (PT), Occupational Therapy (OT), Speech Therapy (ST), 24 hour per day rehabilitation nursing, Neuropsychology, Care Coordinator, Rehabilitation Medicine, Nutrition Services and Pharmacy Services  Weekly team conferences will be held on Wednesday to discuss your progress.  Your Inpatient Rehabilitation Care Coordinator will talk with you frequently to get your input and to update you on team discussions.  Team conferences with you and your family in attendance may also be held.  Expected length of stay: 10-12 days   Overall anticipated outcome: supervision overall  Depending on your progress and recovery, your program may change. Your Inpatient Rehabilitation Care Coordinator will coordinate services and will keep you informed of any changes. Your Inpatient Rehabilitation Care Coordinator's name and contact numbers are listed  below.  The following services may also be recommended but are not provided by the [place]:    [place] will be made to provide these services after discharge if needed.  Arrangements include referral to agencies that provide these services.  Your insurance has been verified to be:  [employer] part A Your primary doctor is:  [name]  Pertinent  information will be shared with your doctor and your insurance company.  Inpatient Rehabilitation Care Coordinator:  Ovidio Kin, [place] or Emilia Beck  Information discussed with and copy given to patient by: Elease Hashimoto, 12/16/2019, 3:21 PM

## 2019-12-16 NOTE — Progress Notes (Signed)
Patient with some hematuria but reports no pain/burning.  Marlowe Shores PA notified, no new orders at this time, will continue to monitor.

## 2019-12-16 NOTE — Patient Care Conference (Signed)
Inpatient RehabilitationTeam Conference and Plan of Care Update Date: 12/16/2019   Time: 11:37 AM     Patient Name: Carlos [name]      Medical Record Number: [id number]  Date of Birth: 04-Sep-1945 Sex: Male         Room/Bed: 4M13C/4M13C-01 Payor Info: Payor: AETNA / Plan: AET[employer] NAP / Product Type: *No Product type* /    Admit Date/Time:  12/15/2019  3:17 PM  Primary Diagnosis:  Closed nondisplaced intertrochanteric fracture of left femur [facility])  Hospital Problems: Principal Problem:   Closed nondisplaced intertrochanteric fracture of left femur ([place]) Active Problems:   Seizure ([place])   Loose stools   Hypoalbuminemia due to protein-calorie malnutrition ([place])   Hypokalemia   Acute blood loss anemia   Seizures ([place])    Expected Discharge Date: Expected Discharge Date: 12/26/19  Team Members Present: Physician leading conference: Dr. Delice Lesch Care Coodinator Present: Dorien Chihuahua, RN, BSN, CRRN;Becky Dupree, LCSW Nurse Present: Suella Grove, RN PT Present: Becky Sax, PT OT Present: Simonne Come, OT SLP Present: Nadara Mode, SLP PPS Coordinator present : Gunnar Fusi, Novella Olive, PT     Current Status/Progress Goal Weekly Team Focus  Bowel/Bladder   Continent x2; Last [facility] 11/9  Remain continent x2  Assess every shift and as needed   Swallow/Nutrition/ Hydration             ADL's   Min-Mod A bathing at sit > stand level, Min-mod assist ambulatory transfers  Supervision  ADL retraining, functional transfers, dynamic standing balance, endurance, cognition   Mobility   transfers min A, gait 82ft without AD mod A, 4 steps 2 rails mod A, WC mobility 147ft supervision  supervision  functional mobility/transfers, generalized strengthening, dynamic standing balance/coordination, ambulation, and endurance   Communication             Safety/Cognition/ Behavioral Observations            Pain   No pain reported during shift  Pain <3/10  Assess every shift and as  needed   Skin   Pressure ulcers to bilateral buttocks  Prevent further breakdown, continue wound care  Assess every shift and as needed     Discharge Planning:      Team Discussion: Skin issues and incontinence of bowel and bladder post radiation and chemo. WOC consulted for skin care recommendations. MD addressed potassium deficiency with supplement and monitoring hematuria/Hgb level with labs. Progress impaired by cognitive issues, poor sequencing and STMD. Patient repeats questions to staff and perseverates. Staff recommend SLP. eval., noted patient had previously received OP SLP. services through 10/27/19.    Patient on target to meet rehab goals: yes, supervision goals set. Currently max assist for lower body dressing and min assist to ambulate 12' without an assistive device. Able to ambulate 62' with min assist and manage 4 steps with mod assist.  *See Care Plan and progress notes for long and short-term goals.   Revisions to Treatment Plan:   Teaching Needs: Transfers, toileting, medications,  Skin care, etc.  Current Barriers to Discharge: Decreased caregiver support, Home enviroment access/layout, Incontinence and Wound care  Possible Resolutions to Barriers: Family education    Medical Summary Current Status: Decreased functional mobility secondary to left intertrochanteric femur fracture. Status post intramedullary fixation 12/11/2019.  Barriers to Discharge: Hemodialysis;Wound care;Incontinence   Possible Resolutions to [employer] Focus: Therapies, meds for loose stools, Ua/Ucx ordered for urine, follow labs - K+, Hb, optimize pain meds   Continued Need for Acute  Rehabilitation Level of Care: The patient requires daily medical management by a physician with specialized training in physical medicine and rehabilitation for the following reasons: Direction of a multidisciplinary physical rehabilitation program to maximize functional independence : Yes Medical  management of patient stability for increased activity during participation in an intensive rehabilitation regime.: Yes Analysis of laboratory values and/or radiology reports with any subsequent need for medication adjustment and/or medical intervention. : Yes   I attest that I was present, lead the team conference, and concur with the assessment and plan of the team.   Dorien Chihuahua B 12/16/2019, 4:13 PM

## 2019-12-16 NOTE — Progress Notes (Signed)
Lower extremity venous bilateral study completed.   Please see CV Proc for preliminary results.   Sharma Lawrance, RDMS  

## 2019-12-16 NOTE — Evaluation (Signed)
Occupational Therapy Assessment and Plan  Patient Details  Name: Carlos [name] MRN: [id number] Date of Birth: 1945-08-10  OT Diagnosis: acute pain, cognitive deficits, muscle weakness (generalized) and pain in joint Rehab Potential: Rehab Potential (ACUTE ONLY): Good ELOS: 10-12 days   Today's Date: 12/16/2019 OT Individual Time: 3845-3646 OT Individual Time Calculation (min): 60 min     Hospital Problem: Principal Problem:   Seizure ([place]) Active Problems:   Closed nondisplaced intertrochanteric fracture of left femur (HCC)   Loose stools   Hypoalbuminemia due to protein-calorie malnutrition (HCC)   Hypokalemia   Acute blood loss anemia   Seizures (HCC)   Past Medical History:  Past Medical History:  Diagnosis Date  . Anxiety   . Benign prostatic hyperplasia 03/30/2013   10/1 IMO update  . Cancer ([place])   . 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 Seizures ([place])    Past Surgical History:  Past Surgical History:  Procedure Laterality Date  . INTRAMEDULLARY (IM) NAIL INTERTROCHANTERIC Left 12/11/2019   Procedure: INTRAMEDULLARY (IM) NAIL INTERTROCHANTRIC;  Surgeon: Rod Can, MD;  Location: [facility];  Service: Orthopedics;  Laterality: Left;  . PORTACATH PLACEMENT N/A 07/01/2019   Procedure: PORT ULTRASOUND GUIDED PLACEMENT;  Surgeon: Alphonsa Overall, MD;  Location: [facility];  Service: General;  Laterality: N/A;  . PROSTATE SURGERY  2004  . TONSILECTOMY, ADENOIDECTOMY, BILATERAL MYRINGOTOMY AND TUBES      Assessment & Plan Clinical Impression: Patient is a 74 y.o.  right-handed male with history of epilepsy maintained on Keppra, rectal adenocarcinoma maintained on Xeloda receiving radiation therapy followed by oncology services Dr.Feng as well as radiation oncology Dr. Lisbeth Renshaw, BPH, anxiety as well as history of mild cognitive disorder. Per chart review patient lives with his 7 year old mother. Independent prior to admission working at the [employer].  His mother has 24-hour personal care attendants. Patient reportedly has good support of local family.  Presented 12/08/2019 to [facility] long hospital with reported breakthrough seizure after radiation treatment.  He was treated with Ativan IM and loaded with Keppra.  Cranial CT scan negative.  Admission chemistries potassium 3.0 glucose 136, hemoglobin 12.2 WBC 3.7, ammonia level 182 question false positive and repeated 24, lactic acid 1.9.  EEG with no seizure.  While patient was at [facility] long ED he did sustain another seizure and fell out of the bed without loss of consciousness landing on his left hip.  X-rays and imaging revealed left intertrochanteric femur fracture.  Patient underwent intramedullary fixation 12/11/2019 per Dr. Lyla Glassing.  Weightbearing as tolerated.  Maintained on aspirin 325 mg daily for DVT prophylaxis.  Acute blood loss anemia 8.4 and monitored.  Therapy evaluations completed and patient was admitted for a comprehensive rehab program..  Patient transferred to CIR on 12/15/2019 .    Patient currently requires mod with basic self-care skills secondary to muscle weakness, decreased cardiorespiratoy endurance and pain, decreased attention, decreased awareness, decreased safety awareness and decreased memory and decreased standing balance, decreased postural control and decreased balance strategies.  Prior to hospitalization, patient could complete ADLs with independent .  Patient will benefit from skilled intervention to increase independence with basic self-care skills prior to discharge home with care partner.  Anticipate patient will require intermittent supervision and follow up home health.  OT - End of Session Activity Tolerance: Tolerates 30+ min activity with multiple rests Endurance Deficit: Yes Endurance Deficit Description: pt required rest breaks throughout session OT Assessment Rehab Potential (ACUTE ONLY): Good OT Barriers to Discharge: Decreased caregiver  support;Home  environment access/layout;Incontinence;Wound Care OT Patient demonstrates impairments in the following area(s): Balance;Cognition;Endurance;Motor;Pain;Safety OT Basic ADL's Functional Problem(s): Grooming;Bathing;Dressing;Toileting OT Transfers Functional Problem(s): Toilet;Tub/Shower OT Additional Impairment(s): None OT Plan OT Intensity: Minimum of 1-2 x/day, 45 to 90 minutes OT Frequency: 5 out of 7 days OT Duration/Estimated Length of Stay: 10-12 days OT Treatment/Interventions: Balance/vestibular training;Cognitive remediation/compensation;Community reintegration;Discharge planning;Disease mangement/prevention;DME/adaptive equipment instruction;Functional mobility training;Pain management;Patient/family education;Psychosocial support;Self Care/advanced ADL retraining;Skin care/wound managment;Therapeutic Activities;Therapeutic Exercise;UE/LE Strength taining/ROM OT Basic Self-Care Anticipated Outcome(s): Supervision OT Toileting Anticipated Outcome(s): Supervision OT Bathroom Transfers Anticipated Outcome(s): Supervision OT Recommendation Recommendations for Other Services: Speech consult Patient destination: Home Follow Up Recommendations: Home health OT Equipment Recommended: Tub/shower bench   OT Evaluation Precautions/Restrictions  Precautions Precautions: Fall Precaution Comments: seizures Restrictions Weight Bearing Restrictions: Yes LLE Weight Bearing: Weight bearing as tolerated Pain Pain Assessment Pain Scale: 0-10 Pain Score: 3-4 Pain Type: Surgical pain Pain Location: Hip Pain Orientation: Left Pain Descriptors / Indicators: Aching Pain Frequency: Intermittent Pain Onset: With Activity Patients Stated Pain Goal: 2 Pain Intervention(s): Medication (See eMAR) Home Living/Prior [place] expects to be discharged to:: Private residence Living Arrangements: Parent Available Help at Discharge: Personal care attendant, Family (mother  has care 24/7, has sister and brother in law that live in [place] that could assist if needed) Type of Home: House Home Access: Stairs to enter, Ramped entrance [employer] of Steps: front and back entrances have steps (5-6 steps), side entrance has a ramp and steps Home Layout: Two level, Able to live on main level with bedroom/bathroom Alternate Level Stairs-Number of Steps: 1 flight Alternate Level Stairs-Rails: Right Bathroom Shower/Tub: Government social research officer Accessibility: Yes  Lives With: Family (lives with mother, who has personal care staff 24/7) IADL History Homemaking Responsibilities: Yes Laundry Responsibility: Secondary Homemaking Comments: personal care attendant did majority of housekeeping tasks Prior Function Level of Independence: Independent with gait, Independent with basic ADLs, Independent with transfers, Independent with homemaking with ambulation  Able to Take Stairs?: Yes Driving: No Vocation: Full time employment 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 Vision Baseline Vision/History: Wears glasses Wears Glasses: At all times Patient Visual Report: No change from baseline Vision Assessment?: No apparent visual deficits Perception  Perception: Within Functional Limits Praxis Praxis: Intact Cognition Overall Cognitive Status: Impaired/Different from baseline Arousal/Alertness: Awake/alert Orientation Level: Person;Place;Situation Person: Oriented Place: Oriented Situation: Oriented Year: 2021 Month: November Day of Week: Incorrect (Wednesday) Memory: Impaired Immediate Memory Recall: Sock;Blue;Bed Memory Recall Sock: Not able to recall (shirt) Memory Recall Blue: With Cue Memory Recall Bed: Not able to recall (chair) Attention: Sustained;Selective Sustained Attention: Impaired Selective Attention: Impaired Awareness: Appears intact Problem Solving:  Appears intact Behaviors: Perseveration Safety/Judgment: Appears intact Comments: Pt jumps from topic to topic Sensation Sensation Light Touch: Appears Intact Proprioception: Appears Intact Coordination Gross Motor Movements are Fluid and Coordinated: No Fine Motor Movements are Fluid and Coordinated: No Coordination and Movement Description: grossly uncoordinated due to pain in LLE, generalized weakness, decreased balance/postural control, and poor endurance Finger Nose Finger Test: mild dysmetria bilaterally Heel Shin Test: decreased ROM on RLE, unable to perform on LLE due to weakness Motor  Motor Motor: Abnormal postural alignment and control Motor - Skilled Clinical Observations: grossly uncoordinated due to pain, generalized weakness, and decreased balance/postural control  Trunk/Postural Assessment  Cervical Assessment Cervical Assessment: Within Functional Limits Thoracic Assessment Thoracic Assessment: Exceptions to [facility] (mild kyphosis) Lumbar Assessment Lumbar Assessment: Exceptions to [facility] (posterior  pelvic tilt) Postural Control Postural Control: Deficits on evaluation  Balance Balance Balance Assessed: Yes Static Sitting Balance Static Sitting - Balance Support: Feet supported;Bilateral upper extremity supported Static Sitting - Level of Assistance: 5: Stand by assistance (supervision) Dynamic Sitting Balance Dynamic Sitting - Balance Support: Bilateral upper extremity supported;Feet supported Dynamic Sitting - Level of Assistance: 5: Stand by assistance (supervision) Static Standing Balance Static Standing - Balance Support: No upper extremity supported Static Standing - Level of Assistance: 4: Min assist Dynamic Standing Balance Dynamic Standing - Balance Support: No upper extremity supported Dynamic Standing - Level of Assistance: 3: Mod assist Extremity/Trunk Assessment RUE Assessment RUE Assessment: Within Functional Limits LUE Assessment LUE Assessment:  Within Functional Limits  Care Tool Care Tool Self Care Eating   Eating Assist Level: Set up assist    Oral Care    Oral Care Assist Level: Set up assist    Bathing   Body parts bathed by patient: Right arm;Left arm;Chest;Abdomen;Face Body parts bathed by helper: Front perineal area;Buttocks;Right upper leg;Left upper leg;Right lower leg;Left lower leg   Assist Level: Maximal Assistance - Patient 24 - 49%    Upper Body Dressing(including orthotics)   What is the patient wearing?: Pull over shirt   Assist Level: Set up assist    Lower Body Dressing (excluding footwear)   What is the patient wearing?: Incontinence brief;Pants Assist for lower body dressing: Maximal Assistance - Patient 25 - 49%    Putting on/Taking off footwear   What is the patient wearing?: Non-skid slipper socks Assist for footwear: Maximal Assistance - Patient 25 - 49%       Care Tool Toileting Toileting activity   Assist for toileting: Maximal Assistance - Patient 25 - 49%     Care Tool Bed Mobility Roll left and right activity        Sit to lying activity        Lying to sitting edge of bed activity   Lying to sitting edge of bed assist level: Minimal Assistance - Patient > 75% (with HOB elevated)     Care Tool Transfers Sit to stand transfer   Sit to stand assist level: Minimal Assistance - Patient > 75%    Chair/bed transfer   Chair/bed transfer assist level: Minimal Assistance - Patient > 75%     Toilet transfer   Assist Level: Moderate Assistance - Patient 50 - 74%     Care Tool Cognition Expression of Ideas and Wants     Understanding Verbal and Non-Verbal Content     Memory/Recall Ability *first 3 days only      Refer to Care Plan for Long Term Goals  SHORT TERM GOAL WEEK 1 OT Short Term Goal 1 (Week 1): Pt will complete LB dressing with min assist OT Short Term Goal 2 (Week 1): Pt will complete bathing with min assist OT Short Term Goal 3 (Week 1): Pt will complete  toileting tasks with min assist  Recommendations for other services: Other: Speech therapy   Skilled Therapeutic Intervention OT eval completed with discussion of rehab process, OT purpose, POC, ELOS, and goals.  ADL assessment completed with focus on functional mobility, sit > stand, and bathing and dressing.  Pt very tangential in his thought process, frequently repeating himself and perseverating on various aspects of his hospitalization with seizures as well as loose bowels.  Pt completed bed mobility min assist to advance LLE to EOB.  Engaged in sit > stand from EOB with mod assist fading  to min assist throughout session.  Therapist providing multimodal cues for hand placement during sit <> stand as pt would repeat cue but with no follow through, ultimately requiring hand over hand.  Pt engaged in bathing at sit > stand from EOB.  Pt reports need to toilet.  Ambulated to bathroom with RW with min-mod assist largely due to pain and stiffness in LLE.  Pt reports loose bowels, lots of gas on toilet but no BM this session.  Returned to EOB to complete bathing.  Pt required max assist LB dressing due to difficulty reaching feet to thread pants.  Pt ambulated to recliner with min assist with RW.  Pt remained semi-reclined with BLE elevated, all needs in reach, and seat belt alarm on.     ADL ADL Upper Body Bathing: Supervision/safety;Setup Where Assessed-Upper Body Bathing: Edge of bed Lower Body Bathing: Maximal assistance Where Assessed-Lower Body Bathing: Edge of bed Upper Body Dressing: Setup Where Assessed-Upper Body Dressing: Edge of bed Lower Body Dressing: Maximal assistance Where Assessed-Lower Body Dressing: Edge of bed Toileting: Moderate assistance Where Assessed-Toileting: Glass blower/designer: Psychiatric nurse Method: Counselling psychologist: Raised toilet seat Mobility  Transfers Sit to Stand: Minimal Assistance - Patient > 75% Stand to Sit:  Minimal Assistance - Patient > 75%   Discharge Criteria: Patient will be discharged from OT if patient refuses treatment 3 consecutive times without medical reason, if treatment goals not met, if there is a change in medical status, if patient makes no progress towards goals or if patient is discharged from hospital.  The above assessment, treatment plan, treatment alternatives and goals were discussed and mutually agreed upon: by patient  Simonne Come 12/16/2019, 12:42 PM

## 2019-12-16 NOTE — Evaluation (Signed)
Physical Therapy Assessment and Plan  Patient Details  Name: Carlos [name] MRN: [id number] Date of Birth: [birth year]  PT Diagnosis: Abnormality of gait, Cognitive deficits, Difficulty walking, Impaired cognition, Muscle weakness and Pain in LLE Rehab Potential: Good ELOS: 10-12 days   Today's Date: 12/16/2019 PT Individual Time: 4696-2952 PT Individual Time Calculation (min): 64 min    Hospital Problem: Principal Problem:   Seizure ([place]) Active Problems:   Closed nondisplaced intertrochanteric fracture of left femur ([place])   Loose stools   Hypoalbuminemia due to protein-calorie malnutrition (H[facility])   Hypokalemia   Acute blood loss anemia   Seizures ([place])   Past Medical History:  Past Medical History:  Diagnosis Date  . Anxiety   . Benign prostatic hyperplasia 03/30/2013   10/1 IMO update  . Cancer ([place])   . 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 Seizures ([place])    Past Surgical History:  Past Surgical History:  Procedure Laterality Date  . INTRAMEDULLARY (IM) NAIL INTERTROCHANTERIC Left 12/11/2019   Procedure: INTRAMEDULLARY (IM) NAIL INTERTROCHANTRIC;  Surgeon: Rod Can, MD;  Location: [facility];  Service: Orthopedics;  Laterality: Left;  . PORTACATH PLACEMENT N/A 07/01/2019   Procedure: PORT ULTRASOUND GUIDED PLACEMENT;  Surgeon: Alphonsa Overall, MD;  Location: [facility];  Service: General;  Laterality: N/A;  . PROSTATE SURGERY  2004  . TONSILECTOMY, ADENOIDECTOMY, BILATERAL MYRINGOTOMY AND TUBES      Assessment & Plan Clinical Impression: Patient is a 74 y.o. year old male with history of epilepsy maintained on Keppra, rectal adenocarcinoma maintained on Xeloda receiving radiation therapy followed by oncology services Dr.Feng as well as radiation oncology Dr. Lisbeth Renshaw, BPH, anxiety as well as history of mild cognitive disorder.   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  Mother has 24/7 assistance from personal care attendant.  Patient  reportedly has good support of local family.  Presented 12/08/2019 to [facility] long hospital with reported breakthrough seizure after radiation treatment.  He was treated with Ativan IM and loaded with Keppra.  Cranial CT scan negative.  Admission chemistries potassium 3.0 glucose 136, hemoglobin 12.2 WBC 3.7, ammonia level 182 question false positive and repeated 24, lactic acid 1.9.  EEG with no seizure.  While patient was at [facility] long ED he did sustain another seizure and fell out of the bed without loss of consciousness landing on his left hip.  X-rays and imaging revealed left intertrochanteric femur fracture.  Patient underwent intramedullary fixation 12/11/2019 per Dr. Lyla Glassing.  Weightbearing as tolerated.  Maintained on aspirin 325 mg daily for DVT prophylaxis.  Acute blood loss anemia 8.4 and monitored.  Patient currently requires mod with mobility secondary to muscle weakness, decreased memory and decreased standing balance, decreased postural control and decreased balance strategies.  Prior to hospitalization, patient was modified independent  with mobility and lived with Family (lives with mother, who has personal care staff 24/7) in a House home.  Home access is front and back entrances have steps (5-6 steps), side entrance has a ramp and stepsStairs to enter, Ramped entrance.  Patient will benefit from skilled PT intervention to maximize safe functional mobility, minimize fall risk and decrease caregiver burden for planned discharge home with intermittent assist.  Anticipate patient will benefit from follow up [facility] at discharge.  PT - End of Session Activity Tolerance: Tolerates 30+ min activity with multiple rests Endurance Deficit: Yes Endurance Deficit Description: pt required rest breaks throughout session PT Assessment Rehab Potential (ACUTE/IP ONLY): Good PT Barriers to Discharge: Decreased  caregiver support;Wound Care;Other (comments) PT Barriers to Discharge Comments: wounds on  buttocks, cognitive deficits, unsure how much assist pt will have at home PT Patient demonstrates impairments in the following area(s): Balance;Endurance;Motor;Pain;Skin Integrity PT Transfers Functional Problem(s): Bed Mobility;Bed to Chair;Car;Furniture PT Locomotion Functional Problem(s): Ambulation;Wheelchair Mobility;Stairs PT Plan PT Intensity: Minimum of 1-2 x/day ,45 to 90 minutes PT Frequency: 5 out of 7 days PT Duration Estimated Length of Stay: 10-12 days PT Treatment/Interventions: Ambulation/gait training;Discharge planning;Functional mobility training;Psychosocial support;Therapeutic Activities;Balance/vestibular training;Disease management/prevention;Neuromuscular re-education;Skin care/wound management;Therapeutic Exercise;Wheelchair propulsion/positioning;Cognitive remediation/compensation;DME/adaptive equipment instruction;Pain management;UE/LE Strength taining/ROM;Community reintegration;Functional electrical stimulation;Patient/family education;Stair training;UE/LE Coordination activities PT Transfers Anticipated Outcome(s): supervision with LRAD PT Locomotion Anticipated Outcome(s): supervision with LRAD PT Recommendation Recommendations for Other Services: Speech consult Follow Up Recommendations: Home health PT Patient destination: Home Equipment Recommended: To be determined   PT Evaluation Precautions/Restrictions Precautions Precautions: Fall Precaution Comments: seizures Restrictions Weight Bearing Restrictions: Yes LLE Weight Bearing: Weight bearing as tolerated Home Living/Prior Functioning Home Living Available Help at Discharge: Personal care attendant;Family (mother has care 24/7, has sister and brother in law that live in [place] that could assist if needed) Type of Home: House Home Access: Stairs to enter;Ramped entrance [employer] of Steps: front and back entrances have steps (5-6 steps), side entrance has a ramp and steps Home  Layout: Two level;Able to live on main level with bedroom/bathroom Alternate Level Stairs-Number of Steps: 1 flight Alternate Level Stairs-Rails: Right Bathroom Shower/Tub: Chiropodist: Standard Bathroom Accessibility: Yes  Lives With: Family (lives with mother, who has personal care staff 24/7) Prior Function Level of Independence: Independent with gait;Independent with basic ADLs;Independent with transfers;Independent with homemaking with ambulation  Able to Take Stairs?: Yes Driving: No Vocation: Full time employment 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, site Overall Cognitive Status: Impaired/Different from baseline Arousal/Alertness: Awake/alert Orientation Level: Oriented X4 Memory: Impaired Awareness: Appears intact Problem Solving: Appears intact Behaviors: Perseveration Safety/Judgment: Appears intact Comments: Pt jumps from topic to topic Sensation Sensation Light Touch: Appears Intact Proprioception: Appears Intact Coordination Gross Motor Movements are Fluid and Coordinated: No Fine Motor Movements are Fluid and Coordinated: No Coordination and Movement Description: grossly uncoordinated due to pain in LLE, generalized weakness, decreased balance/postural control, and poor endurance Finger Nose Finger Test: mild dysmetria bilaterally Heel Shin Test: decreased ROM on RLE, unable to perform on LLE due to weakness Motor  Motor Motor: Abnormal postural alignment and control Motor - Skilled Clinical Observations: grossly uncoordinated due to pain, generalized weakness, and decreased balance/postural control  Trunk/Postural Assessment  Cervical Assessment Cervical Assessment: Within Functional Limits Thoracic Assessment Thoracic Assessment: Exceptions to [facility] (mild kyphosis) Lumbar Assessment Lumbar Assessment: Exceptions to [facility] (posterior pelvic tilt) Postural Control Postural Control:  Deficits on evaluation  Balance Balance Balance Assessed: Yes Static Sitting Balance Static Sitting - Balance Support: Feet supported;Bilateral upper extremity supported Static Sitting - Level of Assistance: 5: Stand by assistance (supervision) Dynamic Sitting Balance Dynamic Sitting - Balance Support: Bilateral upper extremity supported;Feet supported Dynamic Sitting - Level of Assistance: 5: Stand by assistance (supervision) Static Standing Balance Static Standing - Balance Support: No upper extremity supported Static Standing - Level of Assistance: 4: Min assist Dynamic Standing Balance Dynamic Standing - Balance Support: No upper extremity supported Dynamic Standing - Level of Assistance: 3: Mod assist Extremity Assessment  RLE Assessment RLE Assessment: Exceptions to [facility] General Strength Comments: grossly generalized to 4-/5 LLE Assessment LLE Assessment: Exceptions to [facility] LLE Strength Left Hip  Flexion: 2/5 Left Hip ABduction: 3+/5 Left Hip ADduction: 3/5 Left Knee Flexion: 4-/5 Left Knee Extension: 3/5 Left Ankle Dorsiflexion: 3/5 Left Ankle Plantar Flexion: 3+/5  Care Tool Care Tool Bed Mobility Roll left and right activity        Sit to lying activity        Lying to sitting edge of bed activity   Lying to sitting edge of bed assist level: Minimal Assistance - Patient > 75% (with HOB elevated)     Care Tool Transfers Sit to stand transfer   Sit to stand assist level: Minimal Assistance - Patient > 75%    Chair/bed transfer   Chair/bed transfer assist level: Minimal Assistance - Patient > 75%     Physiological scientist transfer assist level: Moderate Assistance - Patient 50 - 74%      Care Tool Locomotion Ambulation   Assist level: Moderate Assistance - Patient 50 - 74% Assistive device: No Device Max distance: 65f  Walk 10 feet activity   Assist level: Moderate Assistance - Patient - 50 - 74% Assistive device: No Device    Walk 50 feet with 2 turns activity Walk 50 feet with 2 turns activity did not occur: Safety/medical concerns (LLE pain, generalized weakness, decreased balance/postural control)      Walk 150 feet activity Walk 150 feet activity did not occur: Safety/medical concerns (LLE pain, generalized weakness, decreased balance/postural control)      Walk 10 feet on uneven surfaces activity   Assist level: Moderate Assistance - Patient - 50 - 74% Assistive device: Other (comment) (R handrail)  Stairs   Assist level: Moderate Assistance - Patient - 50 - 74% Stairs assistive device: 2 hand rails Max number of stairs: 4  Walk up/down 1 step activity   Walk up/down 1 step (curb) assist level: Moderate Assistance - Patient - 50 - 74% Walk up/down 1 step or curb assistive device: 2 hand rails    Walk up/down 4 steps activity Walk up/down 4 steps assist level: Moderate Assistance - Patient - 50 - 74% Walk up/down 4 steps assistive device: 2 hand rails  Walk up/down 12 steps activity Walk up/down 12 steps activity did not occur: Safety/medical concerns (LLE pain, generalized weakness, decreased balance/postural control)      Pick up small objects from floor Pick up small object from the floor (from standing position) activity did not occur: Safety/medical concerns (generalized weakness, decreased balance/postural control)      Wheelchair Will patient use wheelchair at discharge?: No Type of Wheelchair: Manual   Wheelchair assist level: Supervision/Verbal cueing Max wheelchair distance: 1581f Wheel 50 feet with 2 turns activity   Assist Level: Supervision/Verbal cueing  Wheel 150 feet activity   Assist Level: Supervision/Verbal cueing    Refer to Care Plan for Long Term Goals  SHORT TERM GOAL WEEK 1 PT Short Term Goal 1 (Week 1): Pt will perform stand<>pivot transfers with LRAD CGA PT Short Term Goal 2 (Week 1): Pt will ambulate 7530fith LRAD CGA PT Short Term Goal 3 (Week 1): Pt will  perform simulated car transfer with LRAD and CGA  Recommendations for other services: Other: Speech consult  Skilled Therapeutic Intervention Evaluation completed (see details above and below) with education on PT POC and goals and individual treatment initiated with focus on functional mobility/transfers, generalized strengthening, dynamic standing balance/coordination, ambulation, stair navigation, simulated car transfers, and improved activity tolerance. Received pt sitting in  recliner, pt educated on PT evaluation, CIR policies, and therapy schedule and agreeable. Pt reported pain 3/10 in L hip (premedicated). Pt easily distracted jumping to and from different topics and extremely verbose requing cues for re-direction and attention to tasks. Noted pt with short term memory deficits as pt asking therapist the same questions multiple times throughout session. Pt concerned with "disconnect from brain to muscles" asking therapist why he cannot feel the urge to use restroom. Therapist located [phone] Roho cushion for [facility] for pressure relief and skin integrity purposes and educated pt on purpose. Pt transferred sit<>stand without AD and min A and ambulated 83f without AD and mod A to WC while holding onto pieces of furniture while ambulating. Pt demonstrated decreased stride length, decreased weight shifting to LLE, decreased bilateral foot clearance, flexed trunk, and decreased trunk rotation with gait. Pt performed WC mobility 1538fwith supervision using BUE to therapy gym. RN present to adminster medication during session. Pt navigated 4 steps with 2 rails mod A ascending and descending with a step to pattern with cues for "up with the good, down with the bad" technique. Pt transported to ortho gym and performed simulated car transfer without AD and mod A with cues for safe entry into car as pt attempting to side step. Pt with decreased L knee flexion ROM when scooting into car due to pain. Pt ambulated 1070fn  uneven surfaces without AD and mod A with pt relying heavily on R handrail. Pt reported increased pain weight bearing onto LLE. Pt ambulated additional 65f1fth RW and min A. [facility] and ambulated 15ft79fh RW and min A to recliner. Concluded session with pt sitting in recliner with Roho cushion on top for pressure relief, needs within reach, and seatbelt alarm on. Safety plan updated.   Mobility Transfers Transfers: Sit to Stand;Stand to Sit;Stand Pivot Transfers Sit to Stand: Minimal Assistance - Patient > 75% Stand to Sit: Minimal Assistance - Patient > 75% Stand Pivot Transfers: Minimal Assistance - Patient > 75% Stand Pivot Transfer Details: Verbal cues for sequencing;Verbal cues for technique Stand Pivot Transfer Details (indicate cue type and reason): cues for turning technique Transfer (Assistive device): None Locomotion  Gait Ambulation: Yes Gait Assistance: Moderate Assistance - Patient 50-74% Gait Distance (Feet): 12 Feet Assistive device: None Gait Assistance Details: Verbal cues for sequencing;Verbal cues for technique;Verbal cues for gait pattern Gait Assistance Details: verbal cues for upright posture and weight shifitng to L Gait Gait: Yes Gait Pattern: Impaired Gait Pattern: Decreased stride length;Trendelenburg;Decreased trunk rotation;Step-to pattern;Antalgic;Decreased step length - right;Decreased step length - left;Decreased weight shift to left;Trunk flexed;Poor foot clearance - left;Poor foot clearance - right;Narrow base of support Gait velocity: decreased Stairs / Additional Locomotion Stairs: Yes Stairs Assistance: Moderate Assistance - Patient 50 - 74% Stair Management Technique: Two rails Number of Stairs: 4 Height of Stairs: 6 Ramp: Moderate Assistance - Patient 50 - 74% (R handrail) Wheelchair Mobility Wheelchair Mobility: Yes Wheelchair Assistance: SuperChartered loss adjusterh upper  extremities Wheelchair Parts Management: Needs assistance Distance: 150ft 83fscharge Criteria: Patient will be discharged from PT if patient refuses treatment 3 consecutive times without medical reason, if treatment goals not met, if there is a change in medical status, if patient makes no progress towards goals or if patient is discharged from hospital.  The above assessment, treatment plan, treatment alternatives and goals were discussed and mutually agreed upon: by patient  Alyana Kreiter MBlenda Nicely  Becky Sax PT, DPT  12/16/2019, 12:13 PM

## 2019-12-16 NOTE — Progress Notes (Signed)
Physical Therapy Session Note  Patient Details  Name: Carlos [name] MRN: [id number] Date of Birth: [birth year]  Today's Date: 12/16/2019 PT Individual Time: 1530-1630 PT Individual Time Calculation (min): 60 min   Short Term Goals: Week 1:  PT Short Term Goal 1 (Week 1): Pt will perform stand<>pivot transfers with LRAD CGA PT Short Term Goal 2 (Week 1): Pt will ambulate 7ft with LRAD CGA PT Short Term Goal 3 (Week 1): Pt will perform simulated car transfer with LRAD and CGA  Skilled Therapeutic Interventions/Progress Updates:  Pt received semi-reclined in bed asleep, arousable and agreeable to PT session. No complaints of pain but reports L lower extremity stiffness at rest. Supine to sit from flat bed with min A for LLE management and use of bedrails. Sit to stand with min A to RW. Stand pivot transfer to w/c with RW and min A. Manual w/c propulsion x 100 ft with use of BUE at Supervision level. Stand pivot transfer to mat table with RW and min A. Sit to supine min A for LLE management. Supine BLE strengthening and stretching therex 2 x 10-15 reps: heel slides (AAROM on L), hip abd (AAROM on L), SAQ. Pt returned to sitting EOM with min A for LLE management. Sit to stand with CGA to RW. Ambulation x 50 ft, x 100 ft with RW and min A for balance, antalgic gait pattern with decreased stance time on LLE. Pt requests to return to bed at end of session. Min A for sit to supine for LLE management. Pt left semi-reclined in bed with needs in reach, bed alarm in place at end of session.  Therapy Documentation Precautions:  Precautions Precautions: Fall Precaution Comments: seizures Restrictions Weight Bearing Restrictions: Yes LLE Weight Bearing: Weight bearing as tolerated    Therapy/Group: Individual Therapy   Excell Seltzer, PT, DPT  12/16/2019, 5:06 PM

## 2019-12-16 NOTE — Progress Notes (Addendum)
[place] PHYSICAL MEDICINE & REHABILITATION PROGRESS NOTE  Subjective/Complaints: Patient seen in follow-up this morning with therapies.  He states he slept well overnight.  He complains of loose stool and abdominal "bubbling".  Informed by nursing regarding red-colored urine.  Patient also notes urinary incontinence overnight, which is new.  Discussed bowel movements in cognition with therapies.  ROS: Denies CP, SOB, N/V/D  Objective: Vital Signs: Blood pressure 116/75, pulse 87, temperature 98.2 F (36.8 C), temperature source Oral, resp. rate 16, height 5\' 7"  (1.702 m), weight 59.1 kg, SpO2 100 %. No results found. Recent Labs    12/15/19 0336 12/16/19 0428  WBC 4.7 5.7  HGB 8.4* 8.5*  HCT 25.6* 26.0*  PLT 153 196   Recent Labs    12/15/19 0336 12/16/19 0428  NA 139 139  K 3.2* 3.3*  CL 105 105  CO2 25 27  GLUCOSE 104* 100*  BUN 9 10  CREATININE 0.59* 0.62  CALCIUM 8.2* 8.3*    Intake/Output Summary (Last 24 hours) at 12/16/2019 0955 Last data filed at 12/16/2019 0915 Gross per 24 hour  Intake 440 ml  Output 325 ml  Net 115 ml        Physical Exam: BP 116/75 (BP Location: Right Arm)   Pulse 87   Temp 98.2 F (36.8 C) (Oral)   Resp 16   Ht 5\' 7"  (1.702 m)   Wt 59.1 kg   SpO2 100%   BMI 20.41 kg/m  Constitutional: No distress . Vital signs reviewed.  Frail. HENT: Normocephalic.  Atraumatic. Eyes: EOMI. No discharge. Cardiovascular: No JVD.  RRR. Respiratory: Normal effort.  No stridor.  Bilateral clear to auscultation. GI: Non-distended.  BS +. Skin: Warm and dry.  Left hip with dressing CDI. Psych: Normal mood.  Normal behavior.  Musc: Left hip with edema and tenderness. Muscle wasting noted. Neuro: Alert Motor: Left lower extremity limited due to pain, otherwise appears intact  Assessment/Plan: 1. Functional deficits which require 3+ hours per day of interdisciplinary therapy in a comprehensive inpatient rehab setting.  Physiatrist is  providing close team supervision and 24 hour management of active medical problems listed below.  Physiatrist and rehab team continue to assess barriers to discharge/monitor patient progress toward functional and medical goals   Care Tool:  Bathing              Bathing assist       Upper Body Dressing/Undressing Upper body dressing   What is the patient wearing?: Hospital gown only    Upper body assist Assist Level: Minimal Assistance - Patient > 75%    Lower Body Dressing/Undressing Lower body dressing      What is the patient wearing?: Incontinence brief     Lower body assist Assist for lower body dressing: Maximal Assistance - Patient 25 - 49%     Toileting Toileting    Toileting assist Assist for toileting: Moderate Assistance - Patient 50 - 74%     Transfers Chair/bed transfer  Transfers assist     Chair/bed transfer assist level: Moderate Assistance - Patient 50 - 74%     Locomotion Ambulation   Ambulation assist              Walk 10 feet activity   Assist           Walk 50 feet activity   Assist           Walk 150 feet activity   Assist  Walk 10 feet on uneven surface  activity   Assist           Wheelchair     Assist               Wheelchair 50 feet with 2 turns activity    Assist            Wheelchair 150 feet activity     Assist           Medical Problem List and Plan: 1.  Decreased functional mobility secondary to left intertrochanteric femur fracture. Status post intramedullary fixation 12/11/2019.  Weightbearing as tolerated  Begin CIR evaluations   Team conference today to discuss current and goals and coordination of care, home and environmental barriers, and discharge planning with nursing, case manager, and therapies. Please see conference note from today as well.  2.  Antithrombotics:             -DVT/anticoagulation: SCDs, hold off on chemoprophylaxis  given red urine             -antiplatelet therapy: Aspirin 81 BID 3. Pain Management: Robaxin and hydrocodone as needed  Monitor with increased mobility 4. Mood: Team support             -antipsychotic agents: N/A 5. Neuropsych: This patient is not fully capable of making decisions on his own behalf. 6. Skin/Wound Care: Routine skin checks  Xeroform to b/l buttock ulcers 7. Fluids/Electrolytes/Nutrition: Routine in and outs. 8.  Acute blood loss anemia.    Hemoglobin 8.5 on 11/10 9.  Seizure disorder.  Keppra 1250 mg twice daily.  EEG negative 10. Rectal adenocarcinoma.  Follow-up oncology service with Dr. Annamaria Boots as well as radiation oncology Dr. Lisbeth Renshaw.  Radiation treatments currently on hold after hip fracture and Xeloda has been stopped for now.  Appreciate heme/onc recs 11.  Hypokalemia  Potassium 3.3 on 11/10  Daily supplementation started on 11/10  Continue to monitor 12.  Severe hypoalbuminemia  Supplement initiated on 11/10 13.  Red urine  UA/Uculture ordered 14.  Loose stools-?  Secondary to chemo +/- dietary  Fiber ordered  Encouraged avoiding dairy   LOS: 1 days A FACE TO FACE EVALUATION WAS PERFORMED  Pheonix Clinkscale Lorie Phenix 12/16/2019, 9:55 AM

## 2019-12-16 NOTE — Progress Notes (Signed)
Patient Details  Name: Carlos [name] MRN: [id number] Date of Birth: 07/26/1945  Today's Date: 12/16/2019  Hospital Problems: Principal Problem:   Closed nondisplaced intertrochanteric fracture of left femur ([place]) Active Problems:   Seizure ([place])   Loose stools   Hypoalbuminemia due to protein-calorie malnutrition ([place])   Hypokalemia   Acute blood loss anemia   Seizures ([place])  Past Medical History:  Past Medical History:  Diagnosis Date  . Anxiety   . Benign prostatic hyperplasia 03/30/2013   10/1 IMO update  . Cancer ([place])   . 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 Seizures ([place])    Past Surgical History:  Past Surgical History:  Procedure Laterality Date  . INTRAMEDULLARY (IM) NAIL INTERTROCHANTERIC Left 12/11/2019   Procedure: INTRAMEDULLARY (IM) NAIL INTERTROCHANTRIC;  Surgeon: Rod Can, MD;  Location: [facility];  Service: Orthopedics;  Laterality: Left;  . PORTACATH PLACEMENT N/A 07/01/2019   Procedure: PORT ULTRASOUND GUIDED PLACEMENT;  Surgeon: Alphonsa Overall, MD;  Location: [facility];  Service: General;  Laterality: N/A;  . PROSTATE SURGERY  2004  . TONSILECTOMY, ADENOIDECTOMY, BILATERAL MYRINGOTOMY AND TUBES     Social History:  reports that he has never smoked. He has never used smokeless tobacco. He reports that he does not drink alcohol and does not use drugs.  Family / Support Systems Marital Status: Single Patient Roles: Other (Comment) (employee and sibling) Other Supports: Carlos [name] [phone] Carlos [name] [phone] Anticipated Caregiver: Caregiver of Mom's or will need to get his own and sister's Ability/Limitations of Caregiver: Sister's are not available during the day Caregiver Availability: Other (Comment) (Caregivers 4 days and family other times) Family Dynamics: Close knit family who pull together when one of them is in need. Pt lives with Mom who is 62 yo and has caregivers. 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 Guardian/Conservator: Carlos [name] is his POA he has cognitive issues and is according to MD not fully capable of making his own decisions here. Will keep Carlos [name] updated and talk with both pt and her   Abuse/Neglect Abuse/Neglect Assessment Can Be Completed: Yes Physical Abuse: Denies Verbal Abuse: Denies Sexual Abuse: Denies Exploitation of patient/patient's resources: Denies Self-Neglect: Denies  Emotional Status Pt's affect, behavior and adjustment status: Pt is concerned about his memory issues feels they are worse and he is worried about his bowels and how frequently he needs to go and sudden it is. He will do his best and try to get as independent as possible before going home Recent Psychosocial Issues: other health issues-radiation for colon cancer almost finished had one more. History seizures and breakthough seizures Psychiatric History: No history would benefit from seeing neuro-psych while here. He has been through a lot in a short period of time. Substance Abuse History: No issues  Patient / Family Perceptions, Expectations & Goals Pt/Family understanding of illness & functional limitations: Pt has a basic understanding of his hip fracture and seizure issues along with cancer. He and sister talk with the MD's daily and feel they have a good understanding of his treatment plan going forward. Premorbid pt/family roles/activities: Son, brother, employee, cancer  pt, friend, etc Anticipated changes in  roles/activities/participation: resume Pt/family expectations/goals: Pt states: " I want to take care of myself when I leave here."  Carlos [name] states: " We will do what is needed but hope he will do well and be mobile by the time he leaves here."  [employer]: Other (Comment) ([place]) Premorbid Home Care/DME Agencies: None Transportation available at discharge: Family members-his brother did prior to his death Resource referrals recommended: Neuropsychology  Discharge Planning Living Arrangements: Parent Support Systems: Parent, Other relatives, Friends/neighbors Type of Residence: Private residence Insurance Resources: [employer], Multimedia programmer (specify) Scientist, clinical (histocompatibility and immunogenetics)) Financial Resources: Employment Museum/gallery curator Screen Referred: No Living Expenses: Lives with family Money Management: Patient, Family Does the patient have any problems obtaining your medications?: No Home Management: Caregivers do the home managment at [employer] and Merchant navy officer Preliminary Plans: Return home with Mom who has caregivers and family on the weekends. Will await and see what pt's care needs are to see if can manage or will need a caregiver of his own. Made aware of tentative discharge date of 11/20 and supervision level goals. Care Coordinator Barriers to Discharge: Decreased caregiver support Care Coordinator Barriers to Discharge Comments: Caregivers are for his Mom not him Care Coordinator Anticipated Follow Up Needs: HH/OP  Clinical Impression Pleasant gentleman who unfortunately fell and fractured his hip while he was here for work up for breakthrough seizures. His family is very involved and supportive. His brother recently passed away unexpectedly and the funeral is Saturday. Pt is not going. His sister-Carlos [name] is the POA and will go through her to discuss discharge needs and keep pt updated also.  Carlos [name] 12/16/2019, 3:09 PM

## 2019-12-17 ENCOUNTER — Inpatient Hospital Stay (HOSPITAL_COMMUNITY): Payer: 59 | Admitting: Occupational Therapy

## 2019-12-17 ENCOUNTER — Ambulatory Visit: Payer: 59 | Admitting: Physical Therapy

## 2019-12-17 ENCOUNTER — Inpatient Hospital Stay (HOSPITAL_COMMUNITY): Payer: 59

## 2019-12-17 DIAGNOSIS — D62 Acute posthemorrhagic anemia: Secondary | ICD-10-CM | POA: Diagnosis not present

## 2019-12-17 DIAGNOSIS — M25552 Pain in left hip: Secondary | ICD-10-CM

## 2019-12-17 DIAGNOSIS — S72145D Nondisplaced intertrochanteric fracture of left femur, subsequent encounter for closed fracture with routine healing: Secondary | ICD-10-CM | POA: Diagnosis not present

## 2019-12-17 DIAGNOSIS — E876 Hypokalemia: Secondary | ICD-10-CM | POA: Diagnosis not present

## 2019-12-17 DIAGNOSIS — R195 Other fecal abnormalities: Secondary | ICD-10-CM | POA: Diagnosis not present

## 2019-12-17 LAB — URINE CULTURE: Culture: NO GROWTH

## 2019-12-17 NOTE — Progress Notes (Signed)
Physical Therapy Session Note  Patient Details  Name: Carlos [name] MRN: [id number] Date of Birth: [birth year]  Today's Date: 12/17/2019 PT Individual Time: 0801-0857 PT Individual Time Calculation (min): 56 min   Short Term Goals: Week 1:  PT Short Term Goal 1 (Week 1): Pt will perform stand<>pivot transfers with LRAD CGA PT Short Term Goal 2 (Week 1): Pt will ambulate 60ft with LRAD CGA PT Short Term Goal 3 (Week 1): Pt will perform simulated car transfer with LRAD and CGA  Skilled Therapeutic Interventions/Progress Updates:   Received pt supine in bed with RN present administering medications, pt agreeable to therapy, and reported pain 4/10 in L hip but primarily c/o stiffness. Repositioning, rest breaks, and distraction done to reduce pain levels. Session with emphasis on functional mobility/transfers, generalized strengthening, dynamic standing balance/coordination, ambulation, and improved activity tolerance. Pt transferred supine<>sitting EOB from flat bed with min A to manage LLE and use of bedrails. Pt transferred bed<>WC stand<>pivot without AD and min A and brushed teeth and washed face sitting in WC at sink with supervision. Pt transported to therapy gym in [facility] total A for energy conservation purposes and ambulated 50ft with RW and min A. Pt demonstrated narrow BOS, step to gait pattern, flexed trunk, and decreased weight shifting to L. Pt required cues for upright posture and gaze when ambulating. Educated pt on importance of movement while resting in between therapies performing ankle pumps, heel slides, and hip abd/add to reduce stiffness while in bed or sitting in recliner; pt verbalized understanding. Worked on dynamic standing balance tossing ball against rebounder without AD and min A for balance 2x15 reps. MD present for morning rounds. Pt transferred sit<>supine with min A and performed the following exercises supine on wedge with supervision and verbal cues for technique: -heel  slides 2x8 bilaterally beginning with active assisted on LLE progressing to active -hip abduction x8 bilaterally active assisted on LLE Pt required max cues for pursed lip breathing techniques throughout exercises, as pt frequently holding breath and straining. Pt transferred supine<>sitting on mat with min A and cues for logroll technique. Stand<>pivot mat<>WC without AD and min A. Pt transported back to room in [facility] total A and ambulated additional 68ft with RW and min A to recliner. Concluded session with pt sitting in recliner on [place] cushion to maintain skin integrity, needs within reach, and seatbelt alarm on.   Therapy Documentation Precautions:  Precautions Precautions: Fall Precaution Comments: seizures Restrictions Weight Bearing Restrictions: Yes LUE Weight Bearing: Weight bearing as tolerated LLE Weight Bearing: Weight bearing as tolerated  Therapy/Group: Individual Therapy Alfonse Alpers PT, DPT   12/17/2019, 7:24 AM

## 2019-12-17 NOTE — Progress Notes (Signed)
[place] PHYSICAL MEDICINE & REHABILITATION PROGRESS NOTE  Subjective/Complaints: Patient seen standing up working on balancing exercises with therapies.  He states he slept well overnight.  Good balance noted.  He states his left eye is sore.  He notes improvement in bowel movements.  He states he had a good first day of therapies yesterday.  ROS: Denies CP, SOB, N/V/D  Objective: Vital Signs: Blood pressure (!) 110/55, pulse 73, temperature 98.2 F (36.8 C), temperature source Oral, resp. rate 18, height 5\' 7"  (1.702 m), weight 57.9 kg, SpO2 100 %. VAS [place] LOWER EXTREMITY VENOUS (DVT)  Result Date: 12/17/2019  Lower Venous DVT Study Indications: Swelling.  Risk Factors: Cancer Rectal adenocarcinoma Surgery 12-11-2019 Intramedullary nail intertrochantric LT hip. Comparison Study: No prior studies. Performing Technologist: Darlin [name], RDMS  Examination Guidelines: A complete evaluation includes B-mode imaging, spectral Doppler, color Doppler, and power Doppler as needed of all accessible portions of each vessel. Bilateral testing is considered an integral part of a complete examination. Limited examinations for reoccurring indications may be performed as noted. The reflux portion of the exam is performed with the patient in reverse Trendelenburg.  +---------+---------------+---------+-----------+----------+--------------+ RIGHT    CompressibilityPhasicitySpontaneityPropertiesThrombus Aging +---------+---------------+---------+-----------+----------+--------------+ CFV      Full           Yes      Yes                                 +---------+---------------+---------+-----------+----------+--------------+ SFJ      Full                                                        +---------+---------------+---------+-----------+----------+--------------+ FV Prox  Full                                                         +---------+---------------+---------+-----------+----------+--------------+ FV Mid   Full                                                        +---------+---------------+---------+-----------+----------+--------------+ FV DistalFull                                                        +---------+---------------+---------+-----------+----------+--------------+ PFV      Full                                                        +---------+---------------+---------+-----------+----------+--------------+ POP      Full           Yes      Yes                                 +---------+---------------+---------+-----------+----------+--------------+  PTV      Full                                                        +---------+---------------+---------+-----------+----------+--------------+ PERO     Full                                                        +---------+---------------+---------+-----------+----------+--------------+   +---------+---------------+---------+-----------+----------+--------------+ LEFT     CompressibilityPhasicitySpontaneityPropertiesThrombus Aging +---------+---------------+---------+-----------+----------+--------------+ CFV      Full           Yes      Yes                                 +---------+---------------+---------+-----------+----------+--------------+ SFJ      Full                                                        +---------+---------------+---------+-----------+----------+--------------+ FV Prox  Full                                                        +---------+---------------+---------+-----------+----------+--------------+ FV Mid   Full                                                        +---------+---------------+---------+-----------+----------+--------------+ FV DistalFull                                                         +---------+---------------+---------+-----------+----------+--------------+ PFV      Full                                                        +---------+---------------+---------+-----------+----------+--------------+ POP      Full           Yes      Yes                                 +---------+---------------+---------+-----------+----------+--------------+ PTV      Full                                                        +---------+---------------+---------+-----------+----------+--------------+  PERO     Full                                                        +---------+---------------+---------+-----------+----------+--------------+     Summary: RIGHT: - There is no evidence of deep vein thrombosis in the lower extremity.  - No cystic structure found in the popliteal fossa. - Ultrasound characteristics of enlarged lymph nodes are noted in the groin.  LEFT: - There is no evidence of deep vein thrombosis in the lower extremity.  - No cystic structure found in the popliteal fossa. - Ultrasound characteristics of enlarged lymph nodes noted in the groin.  *See table(s) above for measurements and observations. Electronically signed by Harold Barban MD on 12/17/2019 at 7:32:42 AM.    Final    Recent Labs    12/15/19 0336 12/16/19 0428  WBC 4.7 5.7  HGB 8.4* 8.5*  HCT 25.6* 26.0*  PLT 153 196   Recent Labs    12/15/19 0336 12/16/19 0428  NA 139 139  K 3.2* 3.3*  CL 105 105  CO2 25 27  GLUCOSE 104* 100*  BUN 9 10  CREATININE 0.59* 0.62  CALCIUM 8.2* 8.3*    Intake/Output Summary (Last 24 hours) at 12/17/2019 0906 Last data filed at 12/16/2019 1822 Gross per 24 hour  Intake 640 ml  Output 350 ml  Net 290 ml        Physical Exam: BP (!) 110/55 (BP Location: Left Arm)   Pulse 73   Temp 98.2 F (36.8 C) (Oral)   Resp 18   Ht 5\' 7"  (1.702 m)   Wt 57.9 kg   SpO2 100%   BMI 19.99 kg/m  Constitutional: No distress . Vital signs reviewed.   Frail. HENT: Normocephalic.  Atraumatic. Eyes: EOMI. No discharge. Cardiovascular: No JVD.  RRR. Respiratory: Normal effort.  No stridor.  Bilateral clear to auscultation. GI: Non-distended.  BS +. Skin: Warm and dry.  Left hip with dressing CDI. Discoloration in bilateral distal hands Buttock wounds not examined today Psych: Somewhat flat.  Perseverative.   Muscle wasting noted Musc: No edema in extremities.  No tenderness in extremities. Neuro: Alert Motor: Left lower extremity: Hip flexion 2/5, knee extension 4/5, ankle dorsiflexion 4/5  Assessment/Plan: 1. Functional deficits which require 3+ hours per day of interdisciplinary therapy in a comprehensive inpatient rehab setting.  Physiatrist is providing close team supervision and 24 hour management of active medical problems listed below.  Physiatrist and rehab team continue to assess barriers to discharge/monitor patient progress toward functional and medical goals   Care Tool:  Bathing    Body parts bathed by patient: Right arm, Left arm, Chest, Abdomen, Face   Body parts bathed by helper: Front perineal area, Buttocks, Right upper leg, Left upper leg, Right lower leg, Left lower leg     Bathing assist Assist Level: Maximal Assistance - Patient 24 - 49%     Upper Body Dressing/Undressing Upper body dressing   What is the patient wearing?: Pull over shirt    Upper body assist Assist Level: Set up assist    Lower Body Dressing/Undressing Lower body dressing      What is the patient wearing?: Incontinence brief, Pants     Lower body assist Assist for lower body dressing: Maximal Assistance - Patient 25 - 49%  Toileting Toileting    Toileting assist Assist for toileting: Maximal Assistance - Patient 25 - 49%     Transfers Chair/bed transfer  Transfers assist     Chair/bed transfer assist level: Minimal Assistance - Patient > 75%     Locomotion Ambulation   Ambulation assist      Assist  level: Contact Guard/Touching assist Assistive device: Walker-rolling Max distance: 67ft   Walk 10 feet activity   Assist     Assist level: Contact Guard/Touching assist Assistive device: Walker-rolling   Walk 50 feet activity   Assist Walk 50 feet with 2 turns activity did not occur: Safety/medical concerns (LLE pain, generalized weakness, decreased balance/postural control)  Assist level: Contact Guard/Touching assist Assistive device: Walker-rolling    Walk 150 feet activity   Assist Walk 150 feet activity did not occur: Safety/medical concerns (LLE pain, generalized weakness, decreased balance/postural control)         Walk 10 feet on uneven surface  activity   Assist     Assist level: Moderate Assistance - Patient - 50 - 74% Assistive device: Other (comment) (R handrail)   Wheelchair     Assist Will patient use wheelchair at discharge?: No Type of Wheelchair: Manual    Wheelchair assist level: Supervision/Verbal cueing Max wheelchair distance: 188ft    Wheelchair 50 feet with 2 turns activity    Assist        Assist Level: Supervision/Verbal cueing   Wheelchair 150 feet activity     Assist      Assist Level: Supervision/Verbal cueing    Medical Problem List and Plan: 1.  Decreased functional mobility secondary to left intertrochanteric femur fracture. Status post intramedullary fixation 12/11/2019.  Weightbearing as tolerated  Continue CIR 2.  Antithrombotics:             -DVT/anticoagulation: SCDs, hold off on chemoprophylaxis given red urine   Dopplers negative for lower extremity DVT             -antiplatelet therapy: Aspirin 81 BID 3. Pain Management: Robaxin and hydrocodone as needed  Controlled with meds on 11/11  Monitor with increased mobility 4. Mood: Team support             -antipsychotic agents: N/A 5. Neuropsych: This patient is not fully capable of making decisions on his own behalf, suspect baseline  dementia. 6. Skin/Wound Care: Routine skin checks  Xeroform to b/l buttock ulcers 7. Fluids/Electrolytes/Nutrition: Routine in and outs. 8.  Acute blood loss anemia.    Hemoglobin 8.5 on 11/10, labs ordered for tomorrow 9.  Seizure disorder.    Keppra 1250 mg twice daily.  EEG negative 10. Rectal adenocarcinoma.  Follow-up oncology service with Dr. Annamaria Boots as well as radiation oncology Dr. Lisbeth Renshaw.  Radiation treatments currently on hold after hip fracture and Xeloda has been stopped for now.  Appreciate heme/onc recs 11.  Hypokalemia  Potassium 3.3 on 11/10, labs ordered for tomorrow  Daily supplementation started on 11/10  Continue to monitor 12.  Severe hypoalbuminemia  Supplement initiated on 11/10 13.  Red urine  UA relatively unremarkable, urine culture pending 14.  Loose stools-?  Secondary to chemo +/- dietary  Fiber ordered  Encouraged avoiding dairy   Appears to be improving  LOS: 2 days A FACE TO FACE EVALUATION WAS PERFORMED  Danicka Hourihan Lorie Phenix 12/17/2019, 9:06 AM

## 2019-12-17 NOTE — Progress Notes (Signed)
Occupational Therapy Session Note  Patient Details  Name: Carlos [name] MRN: [id number] Date of Birth: Jul 23, 1945  Today's Date: 12/17/2019 OT Individual Time: 9191-6606 OT Individual Time Calculation (min): 30 min    Short Term Goals: Week 1:  OT Short Term Goal 1 (Week 1): Pt will complete LB dressing with min assist OT Short Term Goal 2 (Week 1): Pt will complete bathing with min assist OT Short Term Goal 3 (Week 1): Pt will complete toileting tasks with min assist  Skilled Therapeutic Interventions/Progress Updates:    "[place] the highway" Pt repeatedly stating during session to express his impairments.  Pt reports moderate stiffness in LLE but no "sharp" pain.  Pt completed supine to sit with min assist to support LLE.  Pt ambulated to bathroom using RW and completed 3 in 1 commode transfer and toileting with CGA.  Pt ambulated using RW to sinkside and stand to sit at w/c with CGA.  Pt brushed teeth, doffed/donned shirt overhead, bathed UB seated with setup/supervision.  Pt completed LB dressing using reacher needing min assist due to distractibility and difficulty with carryover with use of AE. Pt ambulated to recliner with CGA, call bell in reach, seat belt alarm on.    Therapy Documentation Precautions:  Precautions Precautions: Fall Precaution Comments: seizures Restrictions Weight Bearing Restrictions: Yes LUE Weight Bearing: Weight bearing as tolerated LLE Weight Bearing: Weight bearing as tolerated   Therapy/Group: Individual Therapy  Ezekiel Slocumb 12/17/2019, 12:53 PM

## 2019-12-17 NOTE — Progress Notes (Signed)
Occupational Therapy Session Note  Patient Details  Name: Carlos [name] MRN: [id number] Date of Birth: 12-04-1945  Today's Date: 12/17/2019 OT Individual Time: 6226-3335 OT Individual Time Calculation (min): 31 min    Short Term Goals: Week 1:  OT Short Term Goal 1 (Week 1): Pt will complete LB dressing with min assist OT Short Term Goal 2 (Week 1): Pt will complete bathing with min assist OT Short Term Goal 3 (Week 1): Pt will complete toileting tasks with min assist  Skilled Therapeutic Interventions/Progress Updates:    Pt in bed to start session with request to go to the bathroom.  He needed min assist to bring the LLE off of the edge of the bed with min guard for sit to stand and mobility to the bathroom. Pt noted to be placing the walker too far out in front of him with each step and picking it up off of the ground.  Mod instructional cueing for smaller steps and to roll the walker.  Once in the bathroom, he pushed the RW to the side, requiring cueing to be sure he kept it in front of him and turned all the way around before sitting down.  Min guard for clothing management and hygiene with min guard for ambulation out to the sink for washing his hands.  Next, he was able to complete 8 mins of BUE strengthening with use of the UE ergonometer.  RPMs maintained at around 24-26 with resistance at level 10.  He returned to the room at conclusion of set via wheelchair and transferred back to the bed with min guard.  Min assist was needed for lifting the LLE up in the bed.  Call button and phone in reach with safety alarm in place.    Therapy Documentation Precautions:  Precautions Precautions: Fall Precaution Comments: seizures Restrictions Weight Bearing Restrictions: No LUE Weight Bearing: Weight bearing as tolerated LLE Weight Bearing: Weight bearing as tolerated  Pain: Pain Assessment Pain Scale: Faces Faces Pain Scale: Hurts a little bit Pain Type: Surgical pain Pain Location:  Hip Pain Orientation: Left Pain Descriptors / Indicators: Grimacing;Discomfort Pain Onset: With Activity Pain Intervention(s): Repositioned ADL: See Care Tool Section for some details of mobility and selfcare  Therapy/Group: Individual Therapy  Tamora Huneke OTR/L 12/17/2019, 4:51 PM

## 2019-12-17 NOTE — Evaluation (Signed)
Speech Language Pathology Assessment and Plan  Patient Details  Name: Carlos [name] MRN: [id number] Date of Birth: 1945-10-27  SLP Diagnosis: Cognitive Impairments  Rehab Potential: Excellent ELOS: 10-12 days    Today's Date: 12/17/2019 SLP Individual Time: 0930-1030 SLP Individual Time Calculation (min): 60 min   Hospital Problem: Principal Problem:   Closed nondisplaced intertrochanteric fracture of left femur (HCC) Active Problems:   Seizure ([place])   Loose stools   Hypoalbuminemia due to protein-calorie malnutrition (HCC)   Hypokalemia   Acute blood loss anemia   Seizures (HCC)   Left hip pain  Past Medical History:  Past Medical History:  Diagnosis Date   Anxiety    Benign prostatic hyperplasia 03/30/2013   10/1 IMO update   Cancer ([place])    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/23/2019   Seizures ([place])    Past Surgical History:  Past Surgical History:  Procedure Laterality Date   INTRAMEDULLARY (IM) NAIL INTERTROCHANTERIC Left 12/11/2019   Procedure: INTRAMEDULLARY (IM) NAIL INTERTROCHANTRIC;  Surgeon: Rod Can, MD;  Location: [facility];  Service: Orthopedics;  Laterality: Left;   PORTACATH PLACEMENT N/A 07/01/2019   Procedure: PORT ULTRASOUND GUIDED PLACEMENT;  Surgeon: Alphonsa Overall, MD;  Location: [facility];  Service: General;  Laterality: N/A;   PROSTATE SURGERY  2004   TONSILECTOMY, ADENOIDECTOMY, BILATERAL MYRINGOTOMY AND TUBES      Assessment / Plan / Recommendation Clinical Impression   Carlos [name] is a 74 year old right-handed male with history of epilepsy maintained on Keppra, rectal adenocarcinoma maintained on Xeloda receiving radiation therapy followed by oncology services Dr.Feng as well as radiation oncology Dr. Lisbeth Renshaw, BPH, anxiety as well as history of mild cognitive disorder. Per chart review patient lives with his 54 year old mother. Independent prior to admission working at the [employer]. His mother has 24-hour  personal care attendants. Patient reportedly has good support of local family.  Presented 12/08/2019 to [facility] long hospital with reported breakthrough seizure after radiation treatment.  He was treated with Ativan IM and loaded with Keppra.  Cranial CT scan negative.  Admission chemistries potassium 3.0 glucose 136, hemoglobin 12.2 WBC 3.7, ammonia level 182 question false positive and repeated 24, lactic acid 1.9.  EEG with no seizure.  While patient was at [facility] long ED he did sustain another seizure and fell out of the bed without loss of consciousness landing on his left hip.  X-rays and imaging revealed left intertrochanteric femur fracture.  Patient underwent intramedullary fixation 12/11/2019 per Dr. Lyla Glassing.  Weightbearing as tolerated.  Maintained on aspirin 325 mg daily for DVT prophylaxis.  Acute blood loss anemia 8.4 and monitored.  Therapy evaluations completed and patient was admitted for a comprehensive rehab program.  Pt presents with mild cognitive linguistic impairment characterized by deficits in memory, attention, and higher level problem solving. Cognistat was administered and pt scored [facility] for all subtests with the exception of delayed recall, and problem solving constructional ability. Pt was very verbose and needed redirection to evaluation tasks due to getting distracted by his own thoughts. Pt reported "Ive always had trouble getting things done and I tend to be chaotic but I do function well in a structured environment." He required 2 verbal cues to increase attention to details during constructional ability subtest and increased time to match patterns of increased complexity. Slow processing noted with written mental calculation task. Pt able to recall medical history and recent medical events. He demonstrated difficulty with delayed recall Cognistat subtest and with recalling information stated by  SLP at beginning of session.   Pt has worked at the [employer] for 30 years prior to  hospitalization and lived at home with 31 year old mom. Pt will benefit from skilled SLP tx during inpatient rehab stay.     Skilled Therapeutic Interventions          Skilled SLP intervention completed and results were reviewed with patient.   SLP Assessment  Patient will need skilled [place] [facility] during CIR admission    Recommendations  Patient destination: Home Follow up Recommendations: Home Health SLP Equipment Recommended: None recommended by SLP    SLP Frequency 3 to 5 out of 7 days   SLP Duration  SLP Intensity  SLP Treatment/Interventions 10-12 days  Minumum of 1-2 x/day, 30 to 90 minutes  Cognitive remediation/compensation;Internal/external aids;Therapeutic Exercise;Patient/family education;Therapeutic Activities;Functional tasks    Pain Pain Assessment Pain Scale: Faces Faces Pain Scale: No hurt  Prior Functioning Cognitive/Linguistic Baseline: Within functional limits Type of Home: House  Lives With: Family Available Help at Discharge: Personal care attendant;Family Vocation: Full time employment  SLP Evaluation Cognition Overall Cognitive Status: Impaired/Different from baseline Arousal/Alertness: Awake/alert Orientation Level: Oriented X4 Attention: Sustained;Selective Sustained Attention: Impaired Sustained Attention Impairment: Verbal complex;Functional complex Selective Attention: Impaired Selective Attention Impairment: Verbal complex;Functional complex Memory: Impaired Memory Impairment: Decreased recall of new information Awareness: Appears intact Problem Solving: Impaired Problem Solving Impairment: Functional complex Executive Function: Organizing;Sequencing;Self Correcting Sequencing: Impaired Sequencing Impairment: Functional complex Organizing: Impaired Organizing Impairment: Functional complex Self Correcting: Impaired Self Correcting Impairment: Functional complex Behaviors: Perseveration Safety/Judgment:  Appears intact Comments: Decreased attention in conversation: verbose. May be his baseline.  Comprehension Auditory Comprehension Overall Auditory Comprehension: Appears within functional limits for tasks assessed Yes/No Questions: Within Functional Limits Expression Expression Primary Mode of Expression: Verbal Verbal Expression Overall Verbal Expression: Appears within functional limits for tasks assessed Oral Motor Oral Motor/Sensory Function Overall Oral Motor/Sensory Function: Within functional limits Motor Speech Overall Motor Speech: Appears within functional limits for tasks assessed  Care Tool Care Tool Cognition Expression of Ideas and Wants Expression of Ideas and Wants: Without difficulty (complex and basic) - expresses complex messages without difficulty and with speech that is clear and easy to understand   Understanding Verbal and Non-Verbal Content Understanding Verbal and Non-Verbal Content: Understands (complex and basic) - clear comprehension without cues or repetitions   Memory/Recall Ability *first 3 days only Memory/Recall Ability *first 3 days only: Current season;Staff names and faces;That he or she is in a hospital/hospital unit        Short Term Goals: Week 1: SLP Short Term Goal 1 (Week 1): Pt will demonstrate selective attention to functional tasks for 10 minutes in moderately distracting environment with supervision verbal cues for redirection. SLP Short Term Goal 2 (Week 1): Pt will recall rehab techniques or 2 events from previous therapy session with mod A using external aids. SLP Short Term Goal 3 (Week 1): Pt will complete functional mildly complex problem solving tasks with mod A verbal cues  Refer to Care Plan for Long Term Goals  Recommendations for other services: None   Discharge Criteria: Patient will be discharged from SLP if patient refuses treatment 3 consecutive times without medical reason, if treatment goals not met, if there is a  change in medical status, if patient makes no progress towards goals or if patient is discharged from hospital.  The above assessment, treatment plan, treatment alternatives and goals were discussed and mutually agreed upon: by patient  Darrol Poke  Tesha Archambeau 12/17/2019, 2:45 PM

## 2019-12-18 ENCOUNTER — Inpatient Hospital Stay (HOSPITAL_COMMUNITY): Payer: 59

## 2019-12-18 ENCOUNTER — Inpatient Hospital Stay (HOSPITAL_COMMUNITY): Payer: 59 | Admitting: Occupational Therapy

## 2019-12-18 DIAGNOSIS — G8918 Other acute postprocedural pain: Secondary | ICD-10-CM

## 2019-12-18 DIAGNOSIS — R195 Other fecal abnormalities: Secondary | ICD-10-CM | POA: Diagnosis not present

## 2019-12-18 DIAGNOSIS — D62 Acute posthemorrhagic anemia: Secondary | ICD-10-CM | POA: Diagnosis not present

## 2019-12-18 DIAGNOSIS — E876 Hypokalemia: Secondary | ICD-10-CM | POA: Diagnosis not present

## 2019-12-18 DIAGNOSIS — R8290 Unspecified abnormal findings in urine: Secondary | ICD-10-CM

## 2019-12-18 DIAGNOSIS — S72145D Nondisplaced intertrochanteric fracture of left femur, subsequent encounter for closed fracture with routine healing: Secondary | ICD-10-CM | POA: Diagnosis not present

## 2019-12-18 LAB — CBC WITH DIFFERENTIAL/PLATELET
Abs Immature Granulocytes: 0.2 10*3/uL — ABNORMAL HIGH (ref 0.00–0.07)
Basophils Absolute: 0 10*3/uL (ref 0.0–0.1)
Basophils Relative: 0 %
Eosinophils Absolute: 0.2 10*3/uL (ref 0.0–0.5)
Eosinophils Relative: 3 %
HCT: 23.5 % — ABNORMAL LOW (ref 39.0–52.0)
Hemoglobin: 7.8 g/dL — ABNORMAL LOW (ref 13.0–17.0)
Immature Granulocytes: 4 %
Lymphocytes Relative: 10 %
Lymphs Abs: 0.5 10*3/uL — ABNORMAL LOW (ref 0.7–4.0)
MCH: 31.3 pg (ref 26.0–34.0)
MCHC: 33.2 g/dL (ref 30.0–36.0)
MCV: 94.4 fL (ref 80.0–100.0)
Monocytes Absolute: 0.8 10*3/uL (ref 0.1–1.0)
Monocytes Relative: 14 %
Neutro Abs: 3.7 10*3/uL (ref 1.7–7.7)
Neutrophils Relative %: 69 %
Platelets: 225 10*3/uL (ref 150–400)
RBC: 2.49 MIL/uL — ABNORMAL LOW (ref 4.22–5.81)
RDW: 16.1 % — ABNORMAL HIGH (ref 11.5–15.5)
WBC: 5.3 10*3/uL (ref 4.0–10.5)
nRBC: 0 % (ref 0.0–0.2)

## 2019-12-18 LAB — BASIC METABOLIC PANEL WITH GFR
Anion gap: 9 (ref 5–15)
BUN: 11 mg/dL (ref 8–23)
CO2: 26 mmol/L (ref 22–32)
Calcium: 8.5 mg/dL — ABNORMAL LOW (ref 8.9–10.3)
Chloride: 104 mmol/L (ref 98–111)
Creatinine, Ser: 0.66 mg/dL (ref 0.61–1.24)
GFR, Estimated: >60 mL/min
Glucose, Bld: 100 mg/dL — ABNORMAL HIGH (ref 70–99)
Potassium: 3.3 mmol/L — ABNORMAL LOW (ref 3.5–5.1)
Sodium: 139 mmol/L (ref 135–145)

## 2019-12-18 MED ORDER — SACCHAROMYCES BOULARDII 250 MG PO CAPS
250.0000 mg | ORAL_CAPSULE | Freq: Two times a day (BID) | ORAL | Status: DC
  Administered 2019-12-18 – 2019-12-30 (×25): 250 mg via ORAL
  Filled 2019-12-18 (×25): qty 1

## 2019-12-18 MED ORDER — POTASSIUM CHLORIDE CRYS ER 20 MEQ PO TBCR
40.0000 meq | EXTENDED_RELEASE_TABLET | Freq: Every day | ORAL | Status: DC
  Administered 2019-12-19 – 2019-12-21 (×3): 40 meq via ORAL
  Filled 2019-12-18 (×3): qty 2

## 2019-12-18 MED ORDER — SIMETHICONE 80 MG PO CHEW
80.0000 mg | CHEWABLE_TABLET | Freq: Four times a day (QID) | ORAL | Status: DC
  Administered 2019-12-18 – 2019-12-30 (×47): 80 mg via ORAL
  Filled 2019-12-18 (×48): qty 1

## 2019-12-18 MED ORDER — CALCIUM POLYCARBOPHIL 625 MG PO TABS
1250.0000 mg | ORAL_TABLET | Freq: Every day | ORAL | Status: DC
  Administered 2019-12-19 – 2019-12-30 (×12): 1250 mg via ORAL
  Filled 2019-12-18 (×12): qty 2

## 2019-12-18 NOTE — Progress Notes (Deleted)
Speech Language Pathology Daily Session Note  Patient Details  Name: Carlos [name] MRN: [id number] Date of Birth: 18-Jun-1945  Today's Date: 12/18/2019 SLP Individual Time: 0828-0930 SLP Individual Time Calculation (min): 62 min  Short Term Goals: Week 1: SLP Short Term Goal 1 (Week 1): Pt will demonstrate selective attention to functional tasks for 10 minutes in moderately distracting environment with supervision verbal cues for redirection. SLP Short Term Goal 2 (Week 1): Pt will recall rehab techniques or 2 events from previous therapy session with mod A using external aids. SLP Short Term Goal 3 (Week 1): Pt will complete functional mildly complex problem solving tasks with mod A verbal cues  Skilled Therapeutic Interventions:     Pain Pain Assessment Pain Scale: 0-10 Pain Score: 0-No pain  Therapy/Group: Individual Therapy  Dewaine Conger 12/18/2019, 10:26 AM

## 2019-12-18 NOTE — Progress Notes (Signed)
[place] PHYSICAL MEDICINE & REHABILITATION PROGRESS NOTE  Subjective/Complaints: Patient seen sitting up in his chair this AM, working with therapies.  He states he slept well overnight.  He says he continues to have chronic stomach "gas".  ROS: Denies CP, SOB, N/V/D  Objective: Vital Signs: Blood pressure 124/61, pulse 80, temperature (!) 97.3 F (36.3 C), resp. rate 17, height 5\' 7"  (1.702 m), weight 60.7 kg, SpO2 100 %. VAS [place] LOWER EXTREMITY VENOUS (DVT)  Result Date: 12/17/2019  Lower Venous DVT Study Indications: Swelling.  Risk Factors: Cancer Rectal adenocarcinoma Surgery 12-11-2019 Intramedullary nail intertrochantric LT hip. Comparison Study: No prior studies. Performing Technologist: Darlin [name], RDMS  Examination Guidelines: A complete evaluation includes B-mode imaging, spectral Doppler, color Doppler, and power Doppler as needed of all accessible portions of each vessel. Bilateral testing is considered an integral part of a complete examination. Limited examinations for reoccurring indications may be performed as noted. The reflux portion of the exam is performed with the patient in reverse Trendelenburg.  +---------+---------------+---------+-----------+----------+--------------+ RIGHT    CompressibilityPhasicitySpontaneityPropertiesThrombus Aging +---------+---------------+---------+-----------+----------+--------------+ CFV      Full           Yes      Yes                                 +---------+---------------+---------+-----------+----------+--------------+ SFJ      Full                                                        +---------+---------------+---------+-----------+----------+--------------+ FV Prox  Full                                                        +---------+---------------+---------+-----------+----------+--------------+ FV Mid   Full                                                         +---------+---------------+---------+-----------+----------+--------------+ FV DistalFull                                                        +---------+---------------+---------+-----------+----------+--------------+ PFV      Full                                                        +---------+---------------+---------+-----------+----------+--------------+ POP      Full           Yes      Yes                                 +---------+---------------+---------+-----------+----------+--------------+  PTV      Full                                                        +---------+---------------+---------+-----------+----------+--------------+ PERO     Full                                                        +---------+---------------+---------+-----------+----------+--------------+   +---------+---------------+---------+-----------+----------+--------------+ LEFT     CompressibilityPhasicitySpontaneityPropertiesThrombus Aging +---------+---------------+---------+-----------+----------+--------------+ CFV      Full           Yes      Yes                                 +---------+---------------+---------+-----------+----------+--------------+ SFJ      Full                                                        +---------+---------------+---------+-----------+----------+--------------+ FV Prox  Full                                                        +---------+---------------+---------+-----------+----------+--------------+ FV Mid   Full                                                        +---------+---------------+---------+-----------+----------+--------------+ FV DistalFull                                                        +---------+---------------+---------+-----------+----------+--------------+ PFV      Full                                                         +---------+---------------+---------+-----------+----------+--------------+ POP      Full           Yes      Yes                                 +---------+---------------+---------+-----------+----------+--------------+ PTV      Full                                                        +---------+---------------+---------+-----------+----------+--------------+  PERO     Full                                                        +---------+---------------+---------+-----------+----------+--------------+     Summary: RIGHT: - There is no evidence of deep vein thrombosis in the lower extremity.  - No cystic structure found in the popliteal fossa. - Ultrasound characteristics of enlarged lymph nodes are noted in the groin.  LEFT: - There is no evidence of deep vein thrombosis in the lower extremity.  - No cystic structure found in the popliteal fossa. - Ultrasound characteristics of enlarged lymph nodes noted in the groin.  *See table(s) above for measurements and observations. Electronically signed by Harold Barban MD on 12/17/2019 at 7:32:42 AM.    Final    Recent Labs    12/16/19 0428 12/18/19 0350  WBC 5.7 5.3  HGB 8.5* 7.8*  HCT 26.0* 23.5*  PLT 196 225   Recent Labs    12/16/19 0428 12/18/19 0350  NA 139 139  K 3.3* 3.3*  CL 105 104  CO2 27 26  GLUCOSE 100* 100*  BUN 10 11  CREATININE 0.62 0.66  CALCIUM 8.3* 8.5*    Intake/Output Summary (Last 24 hours) at 12/18/2019 1032 Last data filed at 12/18/2019 0900 Gross per 24 hour  Intake 460 ml  Output 600 ml  Net -140 ml        Physical Exam: BP 124/61 (BP Location: Right Arm)   Pulse 80   Temp (!) 97.3 F (36.3 C)   Resp 17   Ht 5\' 7"  (1.702 m)   Wt 60.7 kg   SpO2 100%   BMI 20.96 kg/m  Constitutional: No distress . Vital signs reviewed. Frail.  HENT: Normocephalic.  Atraumatic. Eyes: EOMI. No discharge. Cardiovascular: No JVD.  RRR. Respiratory: Normal effort.  No stridor.  Bilateral clear to  auscultation. GI: Non-distended.  BS +. Skin: Warm and dry.  Left hip with staples. Discoloration in bilateral distal hands Buttock wounds healing Psych: Normal mood.  Normal behavior. Muscle wasting noted. Musc: No edema in extremities.  No tenderness in extremities. Neuro: Alert Motor: Left lower extremity: Hip flexion 2/5, knee extension 4/5, ankle dorsiflexion 4/5, improving  Assessment/Plan: 1. Functional deficits which require 3+ hours per day of interdisciplinary therapy in a comprehensive inpatient rehab setting.  Physiatrist is providing close team supervision and 24 hour management of active medical problems listed below.  Physiatrist and rehab team continue to assess barriers to discharge/monitor patient progress toward functional and medical goals   Care Tool:  Bathing    Body parts bathed by patient: Right arm, Left arm, Chest, Abdomen, Face, Buttocks   Body parts bathed by helper: Front perineal area, Buttocks, Right upper leg, Left upper leg, Right lower leg, Left lower leg     Bathing assist Assist Level: Supervision/Verbal cueing     Upper Body Dressing/Undressing Upper body dressing   What is the patient wearing?: Pull over shirt    Upper body assist Assist Level: Set up assist    Lower Body Dressing/Undressing Lower body dressing      What is the patient wearing?: Pants     Lower body assist Assist for lower body dressing: Maximal Assistance - Patient 25 - 49%     Toileting Toileting    Toileting  assist Assist for toileting: Contact Guard/Touching assist     Transfers Chair/bed transfer  Transfers assist     Chair/bed transfer assist level: Contact Guard/Touching assist     Locomotion Ambulation   Ambulation assist      Assist level: Contact Guard/Touching assist Assistive device: Walker-rolling Max distance: 26ft   Walk 10 feet activity   Assist     Assist level: Contact Guard/Touching assist Assistive device:  Walker-rolling   Walk 50 feet activity   Assist Walk 50 feet with 2 turns activity did not occur: Safety/medical concerns (LLE pain, generalized weakness, decreased balance/postural control)  Assist level: Contact Guard/Touching assist Assistive device: Walker-rolling    Walk 150 feet activity   Assist Walk 150 feet activity did not occur: Safety/medical concerns (LLE pain, generalized weakness, decreased balance/postural control)         Walk 10 feet on uneven surface  activity   Assist     Assist level: Moderate Assistance - Patient - 50 - 74% Assistive device: Other (comment) (R handrail)   Wheelchair     Assist Will patient use wheelchair at discharge?: No Type of Wheelchair: Manual    Wheelchair assist level: Supervision/Verbal cueing Max wheelchair distance: 170ft    Wheelchair 50 feet with 2 turns activity    Assist        Assist Level: Supervision/Verbal cueing   Wheelchair 150 feet activity     Assist      Assist Level: Supervision/Verbal cueing    Medical Problem List and Plan: 1.  Decreased functional mobility secondary to left intertrochanteric femur fracture. Status post intramedullary fixation 12/11/2019.  Weightbearing as tolerated  Continue CIR 2.  Antithrombotics:             -DVT/anticoagulation: SCDs, hold off on chemoprophylaxis given red urine   Dopplers negative for lower extremity DVT             -antiplatelet therapy: Aspirin 81 BID 3. Pain Management: Robaxin and hydrocodone as needed  Controlled with meds on 11/12  Monitor with increased mobility 4. Mood: Team support             -antipsychotic agents: N/A 5. Neuropsych: This patient is not fully capable of making decisions on his own behalf, suspect baseline dementia. 6. Skin/Wound Care: Routine skin checks  Xeroform to b/l buttock ulcers 7. Fluids/Electrolytes/Nutrition: Routine in and outs. 8.  Acute blood loss anemia.    Hemoglobin 7.8 on 11/12, labs  ordered for Monday  Hemoccult ordered 9.  Seizure disorder.    Keppra 1250 mg twice daily.  EEG negative  No breakthrough seizures in rehab. 10. Rectal adenocarcinoma.  Follow-up oncology service with Dr. Annamaria Boots as well as radiation oncology Dr. Lisbeth Renshaw.  Radiation treatments currently on hold after hip fracture and Xeloda has been stopped for now.  Appreciate heme/onc recs 11.  Hypokalemia  Potassium 3.3 on 11/10, labs ordered for Monday  Magnesium ordered for Monday  Daily supplementation started on 11/10, increased on 11/12  Continue to monitor 12.  Severe hypoalbuminemia  Supplement initiated on 11/10 13.  Red urine  UA + hemoglobin, urine culture NG  Will ask Urology to evaluate 14.  GI  Loose stools-?  Secondary to chemo +/- dietary  Fiber ordered, increased on 11/12  Probiotic ordered on 11/12  Encouraged avoiding dairy   Simethicone started on 11/12  Appears to be improving  LOS: 3 days A FACE TO FACE EVALUATION WAS PERFORMED  Eowyn Tabone Lorie Phenix 12/18/2019, 10:32 AM

## 2019-12-18 NOTE — Progress Notes (Addendum)
Occupational Therapy Session Note  Patient Details  Name: Carlos [name] MRN: [id number] Date of Birth: 1945/06/21  Today's Date: 12/18/2019 OT Individual Time: 3300-7622 OT Individual Time Calculation (min): 40 min    Short Term Goals: Week 1:  OT Short Term Goal 1 (Week 1): Pt will complete LB dressing with min assist OT Short Term Goal 2 (Week 1): Pt will complete bathing with min assist OT Short Term Goal 3 (Week 1): Pt will complete toileting tasks with min assist  Skilled Therapeutic Interventions/Progress Updates:    Treatment session with focus on functional mobility, transfers, sit > stand, and RW safety. Pt in bed upon arrival reporting concerns regarding upset stomach.  Pt perseverates throughout session on stomach issues and whether or not it is related to his cancer medication, requiring cues to redirect to current task.  Pt initially attempting to refuse therapy but ultimately agreeable.  Pt completed bed mobility with min assist to advance LLE to EOB.  Pt ambulated to sink with RW with CGA and mod cues for RW safety, as pt frequently pushing RW out to side or too far in front.  Pt completed oral care in standing with close supervision.  Pt ambulated to ADL apt with RW with cues for positioning within RW.  Engaged in furniture transfers with focus on [place] with sit <> stand and safety with RW.  Engaged in functional mobility in ADL kitchen with focus on appropriate positioning of RW when obtaining items from various counter heights and educating on use of countertops when transporting items while maintaining appropriate hand placement on RW.  Pt required max cues during transportation of items due to impulsivity and decreased safety awareness with RW.  Pt ambulated back to room with RW with supervision.  Pt reports need to toilet.  Pt with loose BM on toilet, able to complete hygiene and clothing management with CGA.  Pt returned to bed and required assistance to lift LLE in to  bed.  Pt left supine with all needs in reach.  Therapy Documentation Precautions:  Precautions Precautions: Fall Precaution Comments: seizures Restrictions Weight Bearing Restrictions: No LUE Weight Bearing: Weight bearing as tolerated LLE Weight Bearing: Weight bearing as tolerated General:   Vital Signs: Therapy Vitals Temp: 98.6 F (37 C) Temp Source: Oral Pulse Rate: 90 Resp: 16 BP: 124/62 Patient Position (if appropriate): Lying Oxygen Therapy SpO2: 100 % O2 Device: Room Air Pain:  Pt with c/o abdominal pain   Therapy/Group: Individual Therapy  Simonne Come 12/18/2019, 4:02 PM

## 2019-12-18 NOTE — Progress Notes (Signed)
Speech Language Pathology Daily Session Note  Patient Details  Name: Carlos [name] MRN: [id number] Date of Birth: [birth year]  Today's Date: 12/18/2019 SLP Individual Time: 0828-0930 SLP Individual Time Calculation (min): 62 min  Short Term Goals: Week 1: SLP Short Term Goal 1 (Week 1): Pt will demonstrate selective attention to functional tasks for 10 minutes in moderately distracting environment with supervision verbal cues for redirection. SLP Short Term Goal 2 (Week 1): Pt will recall rehab techniques or 2 events from previous therapy session with mod A using external aids. SLP Short Term Goal 3 (Week 1): Pt will complete functional mildly complex problem solving tasks with mod A verbal cues  Skilled Therapeutic Interventions: Pt seen for skilled treatment of cognitive communication goals. Pt continues to demonstrate decreased attention to task and verbosity, perseverating on "nervous stomach" throughout. Pt does respond well to verbal cues to increase attention to task. Pt participating in portions of ALFA to further assess current cognitive function, scoring as follows: "Counting Money" - 100%, "Solving Daily Math Problems" - 90% and "Understanding Medicine Labels" - 60%. Pt demonstrating impulsivity during medication management task impacting accuracy of responses. After initial answered recorded and scored, SLP facilitated task by providing mod A verbal cues to increase awareness of errors and produce correct responses. Pt educated on utilizing pill box vs taking pills directly from the bottle at discharge, verbalized understanding. Pt left in bed with alarm set and all needs within reach. Cont. ST POC.      Pain Pain Assessment Pain Scale: 0-10 Pain Score: 0-No pain  Therapy/Group: Individual Therapy  Dewaine Conger 12/18/2019, 10:27 AM

## 2019-12-18 NOTE — Progress Notes (Signed)
Physical Therapy Session Note  Patient Details  Name: Carlos [name] MRN: [id number] Date of Birth: 06/30/1945  Today's Date: 12/18/2019 PT Individual Time: 5397-6734 and 1131-1156 and [id number] PT Individual Time Calculation (min): 40 min and 25 min and 41 min  Short Term Goals: Week 1:  PT Short Term Goal 1 (Week 1): Pt will perform stand<>pivot transfers with LRAD CGA PT Short Term Goal 2 (Week 1): Pt will ambulate 89ft with LRAD CGA PT Short Term Goal 3 (Week 1): Pt will perform simulated car transfer with LRAD and CGA  Skilled Therapeutic Interventions/Progress Updates:   Treatment Session 1: 1016-1056 40 min Received pt supine in bed, pt agreeable to therapy, and reported pain 3/10 in L hip (premedicated) and stomach discomfort from chemo and radiation treatments. Repositioning, rest breaks, and distraction done to reduce pain levels. Session with emphasis on functional mobility/transfers, generalized strengthening, dynamic standing balance/coordination, ambulation, toileting, and improved activity tolerance. Pt transferred supine<>sitting EOB from flat bed with min A and use of bedrails with assist for LLE management. Pt transferred sit<>stand with RW and CGA and began ambulating out of room but then reported urge to have BM. Ambulated 4ft with RW and CGA to bathroom and pt able to manage clothes with CGA. Pt with small loose BM and accidentally urinating on floor. Therapist cleaned up urine and pt doffed soiled pants max A and donned clean pants sitting on toilet with max A. Pt transferred sit<>stand with RW and CGA and able to perform peri-care and clothing management with CGA. Pt stood at sink and washed hands with CGA and ambulated additional 163ft with RW and CGA to dayroom. Pt demonstrated narrow BOS, step through gait pattern, flexed trunk and downward gaze, and decreased weight shifting to LLE and required cues for upright posture and gaze. Worked on dynamic standing balance tapping  each LE to 3 colored cones based off therapist's cues. Initially without AD and mod handheld A transitioning to using RW due to poor technique with min A for balance 2x8 and 1x5 reps. Pt required cues for RW safety as pt attempting to lean on bilateral forearms on RW. Pt demonstrated decreased L hip flexion ROM with activity. Pt transported back to room in [facility] total A and ambulated additional 91ft with RW and CGA to recliner. Concluded session with pt sitting in recliner on Roho cushion, needs within reach, and seatbelt alarm on.   Treatment Session 2: 1131-1156 25 min Received pt sitting in recliner, pt agreeable to therapy, and reported stiffness in L hip and pain when standing/sitting but did not state level. Repositioning, rest breaks, and distraction done to reduce pain levels. Pt ambulated 38ft x 2 trials with RW and CGA to/from ortho gym with same gait pattern mentioned above. Worked on dynamic standing balance tossing horseshoes without AD x 5 trials and CGA for balance. Pt with 1 significant anterior LOB requiring mod A to correct. Pt transferred sit<>stand from elevated mat with UE support and no AD x5 reps with CGA and 2x5 reps without UE support and CGA. Pt continues to report L hip soreness throughout session and educated pt on healing expectations, recovery timeline, and importance of movement to reduce stiffness. Pt requested to use restroom and able to manage clothes with CGA and void minimally. Concluded session with pt sitting on toilet and agreed to pull alarm cord when finished. RN aware of pt's current status.     Treatment Session 3: 0973-5329 41 min Received pt supine in bed, pt  agreeable to therapy, and reported pain 4/10 in L hip. Pt reporting having problems with his bowels today. RN notified but stated pt recently received pain medication but administered additional medication during session. Repositioning, rest breaks, and distraction done to reduce pain levels. Session with emphasis  on functional mobility/transfers, generalized strengthening, dynamic standing balance/coordination, ambulation, and improved activity tolerance. Pt transferred supine<>sitting EOB from flat bed with bedrails and supervision! Pt reported urge to urinate and ambulated 68ft x 2 trials to/from bathroom with RW and CGA. Pt able to void while standing and wash hands at sink with CGA. Pt transported to dayroom in [facility] total A for energy conservation purposes and ambulated 161ft with RW and CGA with cues for upright posture and gaze. Worked on dynamic standing balance constructing pictures with pipelines with CGA for balance x 1 trial. Pt able to remain standing ~8 minutes prior to sitting. Pt transported back to room in [facility] total A and transferred WC<>bed stand<>pivot with RW CGA and sit<>supine with supervision but using BUE to assist LLE into bed. Concluded session with pt supine in bed, needs within reach, and bed alarm on. Therapist provided fresh drink for pt.   Therapy Documentation Precautions:  Precautions Precautions: Fall Precaution Comments: seizures Restrictions Weight Bearing Restrictions: No LUE Weight Bearing: Weight bearing as tolerated LLE Weight Bearing: Weight bearing as tolerated  Therapy/Group: Individual Therapy Alfonse Alpers PT, DPT   12/18/2019, 7:25 AM

## 2019-12-19 DIAGNOSIS — S72145D Nondisplaced intertrochanteric fracture of left femur, subsequent encounter for closed fracture with routine healing: Secondary | ICD-10-CM | POA: Diagnosis not present

## 2019-12-19 LAB — OCCULT BLOOD X 1 CARD TO LAB, STOOL: Fecal Occult Bld: POSITIVE — AB

## 2019-12-19 NOTE — Progress Notes (Signed)
[place] PHYSICAL MEDICINE & REHABILITATION PROGRESS NOTE  Subjective/Complaints:  Pt reports just " took A lot of meds- maybe 10+"- also ate 100% breakfast. Asking about going back to work after leaving here- explained will need to d/w Dr Posey Pronto.    ROS:  Pt denies SOB, abd pain, CP, N/V/C/D, and vision changes   Objective: Vital Signs: Blood pressure (!) 143/80, pulse 83, temperature 98.7 F (37.1 C), resp. rate 18, height 5\' 7"  (1.702 m), weight 61 kg, SpO2 96 %. No results found. Recent Labs    12/18/19 0350  WBC 5.3  HGB 7.8*  HCT 23.5*  PLT 225   Recent Labs    12/18/19 0350  NA 139  K 3.3*  CL 104  CO2 26  GLUCOSE 100*  BUN 11  CREATININE 0.66  CALCIUM 8.5*    Intake/Output Summary (Last 24 hours) at 12/19/2019 1100 Last data filed at 12/18/2019 1755 Gross per 24 hour  Intake 440 ml  Output --  Net 440 ml     Pressure Injury 12/15/19 Buttocks Left Stage 2 -  Partial thickness loss of dermis presenting as a shallow open injury with a red, pink wound bed without slough. ruptured blister first documented as a skin tear measured 4.3 x 4 x 0cm (Active)  12/15/19   Location: Buttocks  Location Orientation: Left  Staging: Stage 2 -  Partial thickness loss of dermis presenting as a shallow open injury with a red, pink wound bed without slough.  Wound Description (Comments): ruptured blister first documented as a skin tear measured 4.3 x 4 x 0cm  Present on Admission: Yes     Pressure Injury 12/15/19 Buttocks Right Stage 2 -  Partial thickness loss of dermis presenting as a shallow open injury with a red, pink wound bed without slough. noted on admission documented as a skin tear measured 4.3 x 6.2 x 0cm (Active)  12/15/19   Location: Buttocks  Location Orientation: Right  Staging: Stage 2 -  Partial thickness loss of dermis presenting as a shallow open injury with a red, pink wound bed without slough.  Wound Description (Comments): noted on admission  documented as a skin tear measured 4.3 x 6.2 x 0cm  Present on Admission: Yes    Physical Exam: BP (!) 143/80 (BP Location: Right Arm)   Pulse 83   Temp 98.7 F (37.1 C)   Resp 18   Ht 5\' 7"  (1.702 m)   Wt 61 kg   SpO2 96%   BMI 21.06 kg/m  Constitutional: No distress . Vital signs reviewed. Frail. Sitting up in bedside chair, NAD HENT: Normocephalic.  Atraumatic. Eyes: EOMI. No discharge. Cardiovascular: RRR Respiratory:CTA B/L- no W/R/R- good air movement GI: Soft, NT, ND, (+)BS . Skin: Warm and dry.  Left hip with staples. Discoloration in bilateral distal hands Buttock wounds healing Psych: Normal mood.  Normal behavior.- perseverating on work Muscle wasting noted. Musc: No edema in extremities.  No tenderness in extremities. Neuro: Alert Motor: Left lower extremity: Hip flexion 2/5, knee extension 4/5, ankle dorsiflexion 4/5, improving  Assessment/Plan: 1. Functional deficits which require 3+ hours per day of interdisciplinary therapy in a comprehensive inpatient rehab setting.  Physiatrist is providing close team supervision and 24 hour management of active medical problems listed below.  Physiatrist and rehab team continue to assess barriers to discharge/monitor patient progress toward functional and medical goals   Care Tool:  Bathing    Body parts bathed by patient: Right arm, Left arm, Chest,  Abdomen, Face, Buttocks   Body parts bathed by helper: Front perineal area, Buttocks, Right upper leg, Left upper leg, Right lower leg, Left lower leg     Bathing assist Assist Level: Supervision/Verbal cueing     Upper Body Dressing/Undressing Upper body dressing   What is the patient wearing?: Pull over shirt    Upper body assist Assist Level: Set up assist    Lower Body Dressing/Undressing Lower body dressing      What is the patient wearing?: Pants     Lower body assist Assist for lower body dressing: Maximal Assistance - Patient 25 - 49%      Toileting Toileting    Toileting assist Assist for toileting: Contact Guard/Touching assist     Transfers Chair/bed transfer  Transfers assist     Chair/bed transfer assist level: Contact Guard/Touching assist     Locomotion Ambulation   Ambulation assist      Assist level: Contact Guard/Touching assist Assistive device: Walker-rolling Max distance: 150ft   Walk 10 feet activity   Assist     Assist level: Contact Guard/Touching assist Assistive device: Walker-rolling   Walk 50 feet activity   Assist Walk 50 feet with 2 turns activity did not occur: Safety/medical concerns (LLE pain, generalized weakness, decreased balance/postural control)  Assist level: Contact Guard/Touching assist Assistive device: Walker-rolling    Walk 150 feet activity   Assist Walk 150 feet activity did not occur: Safety/medical concerns (LLE pain, generalized weakness, decreased balance/postural control)  Assist level: Contact Guard/Touching assist Assistive device: Walker-rolling    Walk 10 feet on uneven surface  activity   Assist     Assist level: Moderate Assistance - Patient - 50 - 74% Assistive device: Other (comment) (R handrail)   Wheelchair     Assist Will patient use wheelchair at discharge?: No Type of Wheelchair: Manual    Wheelchair assist level: Supervision/Verbal cueing Max wheelchair distance: 110ft    Wheelchair 50 feet with 2 turns activity    Assist        Assist Level: Supervision/Verbal cueing   Wheelchair 150 feet activity     Assist      Assist Level: Supervision/Verbal cueing    Medical Problem List and Plan: 1.  Decreased functional mobility secondary to left intertrochanteric femur fracture. Status post intramedullary fixation 12/11/2019.  Weightbearing as tolerated  Continue CIR 2.  Antithrombotics:             -DVT/anticoagulation: SCDs, hold off on chemoprophylaxis given red urine   Dopplers negative for  lower extremity DVT             -antiplatelet therapy: Aspirin 81 BID 3. Pain Management: Robaxin and hydrocodone as needed  11/13- pain controlled- con't regimen  Monitor with increased mobility 4. Mood: Team support             -antipsychotic agents: N/A 5. Neuropsych: This patient is not fully capable of making decisions on his own behalf, suspect baseline dementia. 6. Skin/Wound Care: Routine skin checks  Xeroform to b/l buttock ulcers 7. Fluids/Electrolytes/Nutrition: Routine in and outs. 8.  Acute blood loss anemia.    Hemoglobin 7.8 on 11/12, labs ordered for Monday  Hemoccult ordered  11/13- no BM yet, so haven't gotten yet- con't to try and get 9.  Seizure disorder.    Keppra 1250 mg twice daily.  EEG negative  No breakthrough seizures in rehab. 10. Rectal adenocarcinoma.  Follow-up oncology service with Dr. Annamaria Boots as well as radiation oncology Dr.  Moody.  Radiation treatments currently on hold after hip fracture and Xeloda has been stopped for now.  Appreciate heme/onc recs 11.  Hypokalemia  Potassium 3.3 on 11/10, labs ordered for Monday  Magnesium ordered for Monday  Daily supplementation started on 11/10, increased on 11/12  11/13- labs Monday    Continue to monitor 12.  Severe hypoalbuminemia  Supplement initiated on 11/10 13.  Red urine  UA + hemoglobin, urine culture NG  Will ask Urology to evaluate 14.  GI  Loose stools-?  Secondary to chemo +/- dietary  Fiber ordered, increased on 11/12  Probiotic ordered on 11/12  Encouraged avoiding dairy   Simethicone started on 11/12  Appears to be improving  LOS: 4 days A FACE TO FACE EVALUATION WAS PERFORMED  Cai Anfinson 12/19/2019, 11:00 AM

## 2019-12-19 NOTE — Progress Notes (Signed)
No occult blood sample obtained this AM yet. Hat is present in toilet and ready for collection.   Bertram Millard LPN

## 2019-12-20 ENCOUNTER — Inpatient Hospital Stay (HOSPITAL_COMMUNITY): Payer: 59 | Admitting: Occupational Therapy

## 2019-12-20 ENCOUNTER — Inpatient Hospital Stay (HOSPITAL_COMMUNITY): Payer: 59 | Admitting: Speech Pathology

## 2019-12-20 ENCOUNTER — Inpatient Hospital Stay (HOSPITAL_COMMUNITY): Payer: 59 | Admitting: Physical Therapy

## 2019-12-20 DIAGNOSIS — S72145D Nondisplaced intertrochanteric fracture of left femur, subsequent encounter for closed fracture with routine healing: Secondary | ICD-10-CM | POA: Diagnosis not present

## 2019-12-20 MED ORDER — MUSCLE RUB 10-15 % EX CREA
TOPICAL_CREAM | CUTANEOUS | Status: DC | PRN
  Filled 2019-12-20: qty 85

## 2019-12-20 NOTE — Progress Notes (Signed)
Speech Language Pathology Daily Session Note  Patient Details  Name: AKIL HOOS MRN: [id number] Date of Birth: 02/25/1945  Today's Date: 12/20/2019 SLP Individual Time: 1300-1345 SLP Individual Time Calculation (min): 45 min  Short Term Goals: Week 1: SLP Short Term Goal 1 (Week 1): Pt will demonstrate selective attention to functional tasks for 10 minutes in moderately distracting environment with supervision verbal cues for redirection. SLP Short Term Goal 2 (Week 1): Pt will recall rehab techniques or 2 events from previous therapy session with mod A using external aids. SLP Short Term Goal 3 (Week 1): Pt will complete functional mildly complex problem solving tasks with mod A verbal cues  Skilled Therapeutic Interventions:  Pt was seen for skilled ST targeting cognitive goals.  Therapist facilitated the session with a novel task targeting attention to detail and error awareness.  Pt needed min assist multimodal cues to identify 100% of errors on a calendar, although he did appear to have some decreased mental flexibility which lead to increased task challenges which were not necessarily inherent within task (ie Pt re-wrote all the dates on the calendar to reflect this current month, rather than simply correcting the dates as they appeared on the task calendar).   Pt reports that he was not "detail oriented" prior to hospitalization but that he compensated for this with consistent routines.  Pt was left in bed with bed alarm set and call bell within reach.  Continue per current plan of care.    Pain Pain Assessment Pain Score: Asleep  Therapy/Group: Individual Therapy  Jesslynn Kruck, Elmyra Ricks L 12/20/2019, 1:50 PM

## 2019-12-20 NOTE — Progress Notes (Signed)
Carlos [name] PROGRESS NOTE  Subjective/Complaints:   Carlos [name] asking for muscle rub on L leg due to pain.  Bowels 'So-so"- a little loose, but OK.   ROS:   Carlos [name] denies SOB, abd pain, CP, N/V/C/D, and vision changes    Objective: Vital Signs: Blood pressure 116/67, pulse 81, temperature 98 F (36.7 C), resp. rate 18, height 5\' 7"  (1.702 m), weight 59 kg, SpO2 99 %. No results found. Recent Labs    12/18/19 0350  WBC 5.3  HGB 7.8*  HCT 23.5*  PLT 225   Recent Labs    12/18/19 0350  NA 139  K 3.3*  CL 104  CO2 26  GLUCOSE 100*  BUN 11  CREATININE 0.66  CALCIUM 8.5*    Intake/Output Summary (Last 24 hours) at 12/20/2019 1302 Last data filed at 12/20/2019 0700 Gross per 24 hour  Intake 300 ml  Output 600 ml  Net -300 ml     Pressure Injury 12/15/19 Buttocks Left Stage 2 -  Partial thickness loss of dermis presenting as a shallow open injury with a red, pink wound bed without slough. ruptured blister first documented as a skin tear measured 4.3 x 4 x 0cm (Active)  12/15/19   Location: Buttocks  Location Orientation: Left  Staging: Stage 2 -  Partial thickness loss of dermis presenting as a shallow open injury with a red, pink wound bed without slough.  Wound Description (Comments): ruptured blister first documented as a skin tear measured 4.3 x 4 x 0cm  Present on Admission: Yes     Pressure Injury 12/15/19 Buttocks Right Stage 2 -  Partial thickness loss of dermis presenting as a shallow open injury with a red, pink wound bed without slough. noted on admission documented as a skin tear measured 4.3 x 6.2 x 0cm (Active)  12/15/19   Location: Buttocks  Location Orientation: Right  Staging: Stage 2 -  Partial thickness loss of dermis presenting as a shallow open injury with a red, pink wound bed without slough.  Wound Description (Comments): noted on admission documented as a skin tear measured 4.3 x 6.2 x 0cm  Present on Admission: Yes     Physical Exam: BP 116/67 (BP Location: Left Arm)   Pulse 81   Temp 98 F (36.7 C)   Resp 18   Ht 5\' 7"  (1.702 m)   Wt 59 kg   SpO2 99%   BMI 20.37 kg/m  Constitutional: No distress . Sitting up in w/c- heading to gym, bright affect, NAD HENT: Normocephalic.  Atraumatic. Eyes: EOMI. No discharge. Cardiovascular: RRR Respiratory: CTA B/L- no W/R/R- good air movement GI: Soft, NT, ND, (+)BS  Skin: Warm and dry.  Left hip with staples. Discoloration in bilateral distal hands Buttock wounds healing Psych: less perseverating today Muscle wasting noted. Musc: No edema in extremities.  No tenderness in extremities. Neuro: Alert Motor: Left lower extremity: Hip flexion 2/5, knee extension 4/5, ankle dorsiflexion 4/5, improving  Assessment/Plan: 1. Functional deficits which require 3+ hours per day of interdisciplinary therapy in a comprehensive inpatient rehab setting.  Physiatrist is providing close team supervision and 24 hour management of active medical problems listed below.  Physiatrist and rehab team continue to assess barriers to discharge/monitor patient progress toward functional and medical goals   Care Tool:  Bathing    Body parts bathed by patient: Right arm, Left arm, Chest, Abdomen, Face, Buttocks   Body parts bathed by helper: Front perineal area, Buttocks, Right  upper leg, Left upper leg, Right lower leg, Left lower leg     Bathing assist Assist Level: Supervision/Verbal cueing     Upper Body Dressing/Undressing Upper body dressing   What is the patient wearing?: Pull over shirt    Upper body assist Assist Level: Set up assist    Lower Body Dressing/Undressing Lower body dressing      What is the patient wearing?: Pants     Lower body assist Assist for lower body dressing: Maximal Assistance - Patient 25 - 49%     Toileting Toileting    Toileting assist Assist for toileting: Contact Guard/Touching assist     Transfers Chair/bed  transfer  Transfers assist     Chair/bed transfer assist level: Contact Guard/Touching assist     Locomotion Ambulation   Ambulation assist      Assist level: Contact Guard/Touching assist Assistive device: Walker-rolling Max distance: 166ft   Walk 10 feet activity   Assist     Assist level: Contact Guard/Touching assist Assistive device: Walker-rolling   Walk 50 feet activity   Assist Walk 50 feet with 2 turns activity did not occur: Safety/medical concerns (LLE pain, generalized weakness, decreased balance/postural control)  Assist level: Contact Guard/Touching assist Assistive device: Walker-rolling    Walk 150 feet activity   Assist Walk 150 feet activity did not occur: Safety/medical concerns (LLE pain, generalized weakness, decreased balance/postural control)  Assist level: Contact Guard/Touching assist Assistive device: Walker-rolling    Walk 10 feet on uneven surface  activity   Assist     Assist level: Moderate Assistance - Patient - 50 - 74% Assistive device: Other (comment) (R handrail)   Wheelchair     Assist Will patient use wheelchair at discharge?: No Type of Wheelchair: Manual    Wheelchair assist level: Supervision/Verbal cueing Max wheelchair distance: 176ft    Wheelchair 50 feet with 2 turns activity    Assist        Assist Level: Supervision/Verbal cueing   Wheelchair 150 feet activity     Assist      Assist Level: Supervision/Verbal cueing    Medical Problem List and Plan: 1.  Decreased functional mobility secondary to left intertrochanteric femur fracture. Status post intramedullary fixation 12/11/2019.  Weightbearing as tolerated  Continue CIR 2.  Antithrombotics:             -DVT/anticoagulation: SCDs, hold off on chemoprophylaxis given red urine   Dopplers negative for lower extremity DVT             -antiplatelet therapy: Aspirin 81 BID 3. Pain Management: Robaxin and hydrocodone as  needed  11/13- pain controlled- con't regimen  11/14- start muscle rub prn per Carlos [name] request.   Monitor with increased mobility 4. Mood: Team support             -antipsychotic agents: N/A 5. Neuropsych: This patient is not fully capable of making decisions on his own behalf, suspect baseline dementia. 6. Skin/Wound Care: Routine skin checks  Xeroform to b/l buttock ulcers 7. Fluids/Electrolytes/Nutrition: Routine in and outs. 8.  Acute blood loss anemia.    Hemoglobin 7.8 on 11/12, labs ordered for Monday  Hemoccult ordered  11/13- no BM yet, so haven't gotten yet- con't to try and get  11/14- got it- they are (+)- Carlos [name] has colon Cancer and doesn't have surgery scheduled until January, so very likely losing blood through colon/stool. Also radiation on hold- monitor H/H. 9.  Seizure disorder.    Keppra 1250 mg twice  daily.  EEG negative  No breakthrough seizures in rehab. 10. Rectal adenocarcinoma.  Follow-up oncology service with Dr. Annamaria Boots as well as radiation oncology Dr. Lisbeth Renshaw.  Radiation treatments currently on hold after hip fracture and Xeloda has been stopped for now.  Appreciate heme/onc recs 11.  Hypokalemia  Potassium 3.3 on 11/10, labs ordered for Monday  Magnesium ordered for Monday  Daily supplementation started on 11/10, increased on 11/12  11/14- labs Monday     Continue to monitor 12.  Severe hypoalbuminemia  Supplement initiated on 11/10 13.  Red urine  UA + hemoglobin, urine culture NG  Will ask Urology to evaluate 14.  GI  Loose stools-?  Secondary to chemo +/- dietary  Fiber ordered, increased on 11/12  Probiotic ordered on 11/12  Encouraged avoiding dairy   Simethicone started on 11/12  Appears to be improving  11/14- Carlos [name] says bowels still a little loose, but overall doing well- he's OK with current stool consistency.   LOS: 5 days A FACE TO FACE EVALUATION WAS PERFORMED  Carlos [name] 12/20/2019, 1:02 PM

## 2019-12-20 NOTE — Progress Notes (Signed)
Physical Therapy Session Note  Patient Details  Name: Carlos [name] MRN: [id number] Date of Birth: [birth year]  Today's Date: 12/20/2019 PT Individual Time: 0810-0909 and 1657-1750 PT Individual Time Calculation (min): 59 min and 53 min  Short Term Goals: Week 1:  PT Short Term Goal 1 (Week 1): Pt will perform stand<>pivot transfers with LRAD CGA PT Short Term Goal 2 (Week 1): Pt will ambulate 52ft with LRAD CGA PT Short Term Goal 3 (Week 1): Pt will perform simulated car transfer with LRAD and CGA  Skilled Therapeutic Interventions/Progress Updates:    Session 1: Pt received supine in bed and agreeable to therapy session. Supine>sitting R EOB, HOB elevated 40 degrees, with supervision and increased time for L LE management. Sit<>stands using RW with close supervision/CGA for steadying throughout session. Pt reports need to use bathroom. Gait ~100ft in/out bathroom using RW with CGA for steadying - standing with close supervision while performing LB clothing management continent of urine. Pt requests to change clothing and demos poor safety awareness starting to doff pants in standing and then pt realizes "I'm not as good as I thought I was" and with cuing for safety, transitioned to performing task in sitting - with increased time and cuing able to thread LEs into pants with min assist. Pt suddenly reports need to use BM. Gait training in/out bathroom as described above using RW. Requests seated rest break for pain management and while sitting donned shirt with set-up assist. Gait training ~176ft to main therapy gym using RW with CGA for steadying and pt demoing significant gait deviations with excessive forward trunk lean over RW with increased B UE WBing down through AD, decreased L hip/knee flexion during swing and instead pt sliding extended LE forward on ground while vaulting with R LE to improve L foot clearance - cuing for corrections with pt having difficulty motor planning and understanding  cues. Attempted standing L LE strengthening using B UE support on RW via L foot taps on/off 4" step; however, pt lacks sufficient hip flexor strength to lift L LE off ground. Transitioned to supine with min assist for L LE management onto mat and performed 10 reps of L heel slides with pt continuing to demonstrate significantly impaired hip flexor strength. Sit>supine with min assist for L LE management. Gait training ~47ft back towards room with therapist again cuing for improved L hip/knee flexion during swing but believe pt unable due to strength deficits and pain - will need to continue addressing this in future sessions. Pt transported remainder of distance back to room in w/c. Pt requesting to return to bed. Stand pivot w/c>EOB using RW with CGA. Sit>supine min assist and pt left supine in bed with needs in reach and bed alarm on.  Session 2: Pt received supine in bed with lights out but awake, watching TV. Pt initially does not appear to remember this therapist from earlier in the day and throughout session demos memory impairments. Pt agreeable to therapy session. Supine>sitting R EOB, HOB partially elevated and using bedrail, with significantly increased time and cuing for improved L LE management. Sit<>stands using RW with CGA/close supervision for safety during session. Pt reports need to use bathroom. Gait ~17ft x2 in/out bathroom using RW with CGA for safety - continues to demo impaired L LE gait mechanics with compensation. Standing with CGA/close supervision for safety performed LB clothing management and continent of bladder. Pt unable to recall cuing from earlier session about safe AD management at sink during hand hygiene,  again placing RW off to the side - therapist reinforced this education repeatedly throughout session.  Gait training ~97ft towards therapy gym using RW with CGA and pt continuing to demonstrate significant gait deviations of increased forward trunk lean over RW with increased  B UE WBing through AD, lacking L hip/knee flexion during swing and instead sliding the extended LE forward and vaulting R LE to clear L foot; along with decreased B LE step lengths and decreased L weight shift during stance. Transported remainder of distance to therapy gym. Stand pivot w/c<>EOM using RW with CGA for steadying. Sit>supine on mat with min assist for L LE management.  Performed the following supine L LE exercises: - therapist performed AAROM/PROM of hip/knee flexion for pain management and in preparation for exercises - L LE heel slides 2x10 reps with therapist providing pt a printout of how to perform exercise and having pt read it and complete 2nd set without cuing to allow pt to perform this outside of therapy sessions - L LE hip flexion (similar movement to heel slides but with therapist assisting with lifting LE off mat to increase hip flexor activation and decrease hamstring compensation as was noted with heel slides) Supine>sitting L EOM with min assist for L LE management.  Standing L LE strengthening using B UE support on RW via L foot taps on/off 4" step 3x 10 reps - pt demos adequate hamstring activation but lacks significant hip flexor activation to place foot up, therefore requiring max manual facilitation. Gait training ~85ft using RW with CGA and therapist providing max manual facilitation of L LE to increase hip/knee flexion during swing phase - pt intermittently vaults R LE requiring cuing to avoid that compensation. Transported back to room and pt requesting to wash hands - with question cuing was able to recall correct AD placement at the sink. Pt agreeable to remain seated in recliner for meal. Gait ~75ft to recliner using RW with CGA and therapist continuing to provide max facilitation for L LE swing phase. Pt left seated in recliner with needs in reach, seat belt alarm on, and meal tray set-up.  Heel slide exercise printout left on bed side table for pt to perform outside  of therapy sessions.  Therapy Documentation Precautions:  Precautions Precautions: Fall Precaution Comments: seizures Restrictions Weight Bearing Restrictions: No LUE Weight Bearing: Weight bearing as tolerated LLE Weight Bearing: Weight bearing as tolerated  Pain: Session 1: Reports L LE pain that increases during mobility - provided seated rest breaks, repositioning, distraction, and emotional support during session - RN notified for medication administration.  Session 2: Reports L LE pain that increases during active L hip flexor activation - provided seated rest breaks throughout session for pain management - pt declines medication.   Therapy/Group: Individual Therapy  Tawana Scale , PT, DPT, CSRS  12/20/2019, 7:46 AM

## 2019-12-20 NOTE — Progress Notes (Signed)
Occupational Therapy Session Note  Patient Details  Name: Carlos [name] MRN: [id number] Date of Birth: 1945-03-25  Today's Date: 12/20/2019 OT Individual Time: 1941-7408 OT Individual Time Calculation (min): 59 min    Short Term Goals: Week 1:  OT Short Term Goal 1 (Week 1): Pt will complete LB dressing with min assist OT Short Term Goal 2 (Week 1): Pt will complete bathing with min assist OT Short Term Goal 3 (Week 1): Pt will complete toileting tasks with min assist  Skilled Therapeutic Interventions/Progress Updates:    Treatment session with focus on functional mobility, sequencing, awareness, and problem solving during functional mobility and therapeutic activities.  Pt received supine in bed declining bathing/dressing but agreeable to therapy session.  Pt completed bed mobility with min assist to bring LLE to EOB after cues to attempt without physical assistance.  Pt ambulated 150' with RW with supervision to therapy gym with improved positioning within RW.  Engaged in [place] 4 in standing with focus on sequencing and awareness of "whole picture" as pt frequently perseverates on one issue and is unable to see the "whole picture" of rehab and his recovery.  Pt demonstrating increased impulsivity with [place] 4, requiring cues to slow down.  Engaged in blocked practice of bed mobility to problem solve advancing LLE in and out of bed.  Progressed from min assist and multimodal cues to ability to complete with supervision while on therapy mat.  Engaged in hip flexion and abduction on LLE at parallel bars with focus on increased activation and mobility of LLE.  Pt continues to perseverate on decreased mobility, especially with difficulty with hip flexion to tap target with foot.  Ambulated back to room with RW with supervision.  Returned to bed with supervision, cues to carry over education from earlier in session to advance LLE in to bed.  Pt remained semi-reclined in bed with all needs in  reach.  Therapy Documentation Precautions:  Precautions Precautions: Fall Precaution Comments: seizures Restrictions Weight Bearing Restrictions: No LUE Weight Bearing: Weight bearing as tolerated LLE Weight Bearing: Weight bearing as tolerated Pain: Pain Assessment Pain Scale: 0-10 Pain Score: 3 Pain Type: Surgical pain Pain Location: Hip Pain Orientation: Left Pain Descriptors / Indicators: Aching Pain Frequency: Intermittent Pain Onset: On-going Patients Stated Pain Goal: 2 Pain Intervention(s): Premedicated    Therapy/Group: Individual Therapy  Simonne Come 12/20/2019, 10:38 AM

## 2019-12-21 ENCOUNTER — Inpatient Hospital Stay (HOSPITAL_COMMUNITY): Payer: 59 | Admitting: Occupational Therapy

## 2019-12-21 ENCOUNTER — Inpatient Hospital Stay (HOSPITAL_COMMUNITY): Payer: 59

## 2019-12-21 ENCOUNTER — Encounter (HOSPITAL_COMMUNITY): Payer: 59 | Admitting: Psychology

## 2019-12-21 DIAGNOSIS — S72145S Nondisplaced intertrochanteric fracture of left femur, sequela: Secondary | ICD-10-CM

## 2019-12-21 DIAGNOSIS — R569 Unspecified convulsions: Secondary | ICD-10-CM | POA: Diagnosis not present

## 2019-12-21 DIAGNOSIS — R195 Other fecal abnormalities: Secondary | ICD-10-CM | POA: Diagnosis not present

## 2019-12-21 DIAGNOSIS — R8290 Unspecified abnormal findings in urine: Secondary | ICD-10-CM | POA: Diagnosis not present

## 2019-12-21 DIAGNOSIS — E876 Hypokalemia: Secondary | ICD-10-CM | POA: Diagnosis not present

## 2019-12-21 LAB — BASIC METABOLIC PANEL
Anion gap: 10 (ref 5–15)
BUN: 11 mg/dL (ref 8–23)
CO2: 26 mmol/L (ref 22–32)
Calcium: 8.9 mg/dL (ref 8.9–10.3)
Chloride: 102 mmol/L (ref 98–111)
Creatinine, Ser: 0.84 mg/dL (ref 0.61–1.24)
GFR, Estimated: >60 mL/min (ref >60)
Glucose, Bld: 91 mg/dL (ref 70–99)
Potassium: 4.1 mmol/L (ref 3.5–5.1)
Sodium: 138 mmol/L (ref 135–145)

## 2019-12-21 LAB — CBC WITH DIFFERENTIAL/PLATELET
Abs Immature Granulocytes: 0.18 10*3/uL — ABNORMAL HIGH (ref 0.00–0.07)
Basophils Absolute: 0 10*3/uL (ref 0.0–0.1)
Basophils Relative: 0 %
Eosinophils Absolute: 0.1 10*3/uL (ref 0.0–0.5)
Eosinophils Relative: 2 %
HCT: 30.6 % — ABNORMAL LOW (ref 39.0–52.0)
Hemoglobin: 9.8 g/dL — ABNORMAL LOW (ref 13.0–17.0)
Immature Granulocytes: 4 %
Lymphocytes Relative: 10 %
Lymphs Abs: 0.5 10*3/uL — ABNORMAL LOW (ref 0.7–4.0)
MCH: 30.8 pg (ref 26.0–34.0)
MCHC: 32 g/dL (ref 30.0–36.0)
MCV: 96.2 fL (ref 80.0–100.0)
Monocytes Absolute: 0.8 10*3/uL (ref 0.1–1.0)
Monocytes Relative: 16 %
Neutro Abs: 3.3 10*3/uL (ref 1.7–7.7)
Neutrophils Relative %: 68 %
Platelets: 292 10*3/uL (ref 150–400)
RBC: 3.18 MIL/uL — ABNORMAL LOW (ref 4.22–5.81)
RDW: 16.3 % — ABNORMAL HIGH (ref 11.5–15.5)
WBC: 4.8 10*3/uL (ref 4.0–10.5)
nRBC: 0 % (ref 0.0–0.2)

## 2019-12-21 LAB — MAGNESIUM: Magnesium: 2 mg/dL (ref 1.7–2.4)

## 2019-12-21 MED ORDER — POTASSIUM CHLORIDE CRYS ER 20 MEQ PO TBCR
20.0000 meq | EXTENDED_RELEASE_TABLET | Freq: Every day | ORAL | Status: DC
  Administered 2019-12-22 – 2019-12-28 (×7): 20 meq via ORAL
  Filled 2019-12-21 (×7): qty 1

## 2019-12-21 NOTE — Progress Notes (Signed)
Physical Therapy Session Note  Patient Details  Name: Carlos [name] MRN: [id number] Date of Birth: 23-Apr-1945  Today's Date: 12/21/2019 PT Individual Time: 1300-1409 PT Individual Time Calculation (min): 69 min   Short Term Goals: Week 1:  PT Short Term Goal 1 (Week 1): Pt will perform stand<>pivot transfers with LRAD CGA PT Short Term Goal 2 (Week 1): Pt will ambulate 61ft with LRAD CGA PT Short Term Goal 3 (Week 1): Pt will perform simulated car transfer with LRAD and CGA  Skilled Therapeutic Interventions/Progress Updates:   Received pt supine in bed on phone with sister. Pt requested therapist speak with his sister. Therapist spoke with pt's sister and updated her on pt's current mobility status and targeted discharge date. Pt agreeable to therapy and reported mild pain in L hip but declined pain interventions. Repositioning, rest breaks, and distraction done to reduce pain levels. Session with emphasis on functional mobility/transfers, generalized strengthening, dynamic standing balance/coordination, gait training, stair navigation, and improved activity tolerance. Pt transferred supine<>sitting EOB with supervision and cues for technique to manage LLE. Pt ambulated 138ft with RW and CGA to therapy gym with cues to avoid pressing BUE through RW. Pt required cues for upright posture and gaze as well. Worked on dynamic standing balance and L hip flexion strength tapping BLE to 3 colored dots 3x2 trials and 3x3 trials with RW and CGA for balance. Pt demonstrated improved hip flexion strength and ROM with increased reps but required verbal cues for pursed lip breathing. Pt ambulated 57ft x 2 trials with RW and CGA to/from // bars and practiced forward walking inside // bars along agility ladder x 3 trials and x 2 trials with BUE support and CGA with emphasis on L hip flexion ROM. Initially, pt hip hiking to clear LLE avoiding knee flexion. However with max cues for sequencing and technique pt able  to flex L knee and step through demonstrating improved gait pattern and less hip circumduction. Pt then performed lateral side stepping inside // bars along agility ladder x 2 laps with BUE support and CGA. Pt required max cues for upright posture, to "look in the mirror", and to flex L knee. Pt fixated on pain in LLE with activity constantly stating "that's where the pain is." Therapist educated pt on expectations in terms of pain levels due to hx of injury and muscles affected with surgery. Pt ambulated 44ft with RW and CGA to staircase and navigated 4 steps with 2 rails and min A ascending and descending with a step to pattern. Pt required cues for "up with the good, down with the bad" technique. Pt also required cues to flex L knee rather than keep it extended when navigating steps. Pt transported back to room in [facility] total and requested to rest in bed. Stand<>pivot WC<>bed with RW and CGA and sit<>supine with min A for LLE management. Concluded session with pt supine in bed, needs within reach, and bed alarm on. NT present to assess vitals.   Therapy Documentation Precautions:  Precautions Precautions: Fall Precaution Comments: seizures Restrictions Weight Bearing Restrictions: No LUE Weight Bearing: Weight bearing as tolerated LLE Weight Bearing: Weight bearing as tolerated   Therapy/Group: Individual Therapy Alfonse Alpers PT, DPT   12/21/2019, 7:35 AM

## 2019-12-21 NOTE — Progress Notes (Signed)
Speech Language Pathology Daily Session Note  Patient Details  Name: Carlos [name] MRN: [id number] Date of Birth: [birth year]  Today's Date: 12/21/2019 SLP Individual Time: 1002-1059 SLP Individual Time Calculation (min): 57 min  Short Term Goals: Week 1: SLP Short Term Goal 1 (Week 1): Pt will demonstrate selective attention to functional tasks for 10 minutes in moderately distracting environment with supervision verbal cues for redirection. SLP Short Term Goal 2 (Week 1): Pt will recall rehab techniques or 2 events from previous therapy session with mod A using external aids. SLP Short Term Goal 3 (Week 1): Pt will complete functional mildly complex problem solving tasks with mod A verbal cues  Skilled Therapeutic Interventions:Skilled ST services focused on cognitive skills. SLP facilitated complex problem solving skills in QID pill organizer task, pt initially required max A verbal cues for problem solving and error awareness largely due to internal distractions. Pt verbalized continuously during task remaining mainly on topic. Once SLP pointed out errors in filling out medication consumed x2 a day, pt was able to self-regulate selective attention and completed the next two medications with supervision A verbal cues for problem solving. SLP recommends to complete QID pill organizer task in next treatment session. Pt was left in room with call bell within reach and chair alarm set. SLP recommends to continue skilled services.     Pain Pain Assessment Pain Scale: 0-10 Pain Score: 0-No pain  Therapy/Group: Individual Therapy  Celest Reitz  [facility] 12/21/2019, 12:52 PM

## 2019-12-21 NOTE — Progress Notes (Signed)
Occupational Therapy Session Note  Patient Details  Name: Carlos [name] MRN: [id number] Date of Birth: 05-29-1945  Today's Date: 12/21/2019 OT Individual Time: 4967-5916 OT Individual Time Calculation (min): 59 min    Short Term Goals: Week 1:  OT Short Term Goal 1 (Week 1): Pt will complete LB dressing with min assist OT Short Term Goal 2 (Week 1): Pt will complete bathing with min assist OT Short Term Goal 3 (Week 1): Pt will complete toileting tasks with min assist  Skilled Therapeutic Interventions/Progress Updates:    Treatment session with focus on self-care retraining, functional transfers, and activity tolerance.  Pt received supine in bed agreeable to therapy session.  Pt agreeable to shower this session, however continues to perseverate on the importance of PT to "work out my leg". Pt completed sit > stand and ambulatory transfer to room shower with RW with supervision.  Pt engaged in bathing at sit > stand level in shower with supervision.  Therapist providing min cues to attend to task to ensure safety and completion as pt quite verbose and tangential.  Pt completed UB dressing with setup and LB dressing with min assist to don Lt sock due to limited ROM and hip flexion.  Therapist educated pt on use of sock aid, will require additional practice as well as problem solving other alternatives for donning socks.  Pt states "This is where I'm crippled" when completing LB dressing, especially when donning socks. Pt able to thread BLE in to pants with cues to thread LLE first. Pt remained seated EOB with bed alarm on awaiting RN to change dressing to wounds on buttocks.   Therapy Documentation Precautions:  Precautions Precautions: Fall Precaution Comments: seizures Restrictions Weight Bearing Restrictions: No LUE Weight Bearing: Weight bearing as tolerated LLE Weight Bearing: Weight bearing as tolerated Pain:  Pt with no c/o pain   Therapy/Group: Individual Therapy  Simonne Come 12/21/2019, 8:11 AM

## 2019-12-21 NOTE — Progress Notes (Signed)
[facility] PHYSICAL MEDICINE & REHABILITATION PROGRESS NOTE  Subjective/Complaints: Patient seen laying in bed this morning.  He states he slept well overnight.  He has questions regarding seizures again.  He notes improvement in abdominal discomfort.  Notified by nursing requesting callback from family member.  Attempted to call, however went to voicemail and mailbox full.  Wound discussed with nursing.  ROS: Denies CP, SOB, N/V/D  Objective: Vital Signs: Blood pressure 134/74, pulse 97, temperature 98.1 F (36.7 C), temperature source Oral, resp. rate 18, height 5\' 7"  (1.702 m), weight 58.2 kg, SpO2 100 %. No results found. Recent Labs    12/21/19 0945  WBC 4.8  HGB 9.8*  HCT 30.6*  PLT 292   Recent Labs    12/21/19 0945  NA 138  K 4.1  CL 102  CO2 26  GLUCOSE 91  BUN 11  CREATININE 0.84  CALCIUM 8.9    Intake/Output Summary (Last 24 hours) at 12/21/2019 1656 Last data filed at 12/21/2019 1300 Gross per 24 hour  Intake 780 ml  Output --  Net 780 ml     Pressure Injury 12/15/19 Buttocks Left Stage 2 -  Partial thickness loss of dermis presenting as a shallow open injury with a red, pink wound bed without slough. ruptured blister first documented as a skin tear measured 4.3 x 4 x 0cm (Active)  12/15/19   Location: Buttocks  Location Orientation: Left  Staging: Stage 2 -  Partial thickness loss of dermis presenting as a shallow open injury with a red, pink wound bed without slough.  Wound Description (Comments): ruptured blister first documented as a skin tear measured 4.3 x 4 x 0cm  Present on Admission: Yes     Pressure Injury 12/15/19 Buttocks Right Stage 2 -  Partial thickness loss of dermis presenting as a shallow open injury with a red, pink wound bed without slough. noted on admission documented as a skin tear measured 4.3 x 6.2 x 0cm (Active)  12/15/19   Location: Buttocks  Location Orientation: Right  Staging: Stage 2 -  Partial thickness loss of dermis  presenting as a shallow open injury with a red, pink wound bed without slough.  Wound Description (Comments): noted on admission documented as a skin tear measured 4.3 x 6.2 x 0cm  Present on Admission: Yes    Physical Exam: BP 134/74 (BP Location: Left Arm)   Pulse 97   Temp 98.1 F (36.7 C) (Oral)   Resp 18   Ht 5\' 7"  (1.702 m)   Wt 58.2 kg   SpO2 100%   BMI 20.10 kg/m  Constitutional: No distress . Vital signs reviewed. HENT: Normocephalic.  Atraumatic. Eyes: EOMI. No discharge. Cardiovascular: No JVD.  RRR. Respiratory: Normal effort.  No stridor.  Bilateral clear to auscultation. GI: Non-distended.  BS +. Skin: Warm and dry.  Left hip staples CDI Discoloration of bilateral hands Buttock wounds healing Psych: Normal mood.  Normal behavior. Musc: No edema in extremities.   Generalized muscle wasting noted. Neuro: Alert Motor: Left lower extremity: Hip flexion 2/5, knee extension 4/5, ankle dorsiflexion 4/5, improving  Assessment/Plan: 1. Functional deficits which require 3+ hours per day of interdisciplinary therapy in a comprehensive inpatient rehab setting.  Physiatrist is providing close team supervision and 24 hour management of active medical problems listed below.  Physiatrist and rehab team continue to assess barriers to discharge/monitor patient progress toward functional and medical goals   Care Tool:  Bathing    Body parts bathed by  patient: Right arm, Left arm, Chest, Abdomen, Face, Buttocks, Front perineal area, Right upper leg, Left upper leg, Right lower leg, Left lower leg   Body parts bathed by helper: Front perineal area, Buttocks, Right upper leg, Left upper leg, Right lower leg, Left lower leg     Bathing assist Assist Level: Supervision/Verbal cueing     Upper Body Dressing/Undressing Upper body dressing   What is the patient wearing?: Pull over shirt    Upper body assist Assist Level: Set up assist    Lower Body  Dressing/Undressing Lower body dressing      What is the patient wearing?: Pants     Lower body assist Assist for lower body dressing: Minimal Assistance - Patient > 75%     Toileting Toileting    Toileting assist Assist for toileting: Contact Guard/Touching assist     Transfers Chair/bed transfer  Transfers assist     Chair/bed transfer assist level: Contact Guard/Touching assist Chair/bed transfer assistive device: Programmer, multimedia   Ambulation assist      Assist level: Contact Guard/Touching assist Assistive device: Walker-rolling Max distance: 156ft   Walk 10 feet activity   Assist     Assist level: Contact Guard/Touching assist Assistive device: Walker-rolling   Walk 50 feet activity   Assist Walk 50 feet with 2 turns activity did not occur: Safety/medical concerns (LLE pain, generalized weakness, decreased balance/postural control)  Assist level: Contact Guard/Touching assist Assistive device: Walker-rolling    Walk 150 feet activity   Assist Walk 150 feet activity did not occur: Safety/medical concerns (LLE pain, generalized weakness, decreased balance/postural control)  Assist level: Contact Guard/Touching assist Assistive device: Walker-rolling    Walk 10 feet on uneven surface  activity   Assist     Assist level: Moderate Assistance - Patient - 50 - 74% Assistive device: Other (comment) (R handrail)   Wheelchair     Assist Will patient use wheelchair at discharge?: No Type of Wheelchair: Manual    Wheelchair assist level: Supervision/Verbal cueing Max wheelchair distance: 187ft    Wheelchair 50 feet with 2 turns activity    Assist        Assist Level: Supervision/Verbal cueing   Wheelchair 150 feet activity     Assist      Assist Level: Supervision/Verbal cueing    Medical Problem List and Plan: 1.  Decreased functional mobility secondary to left intertrochanteric femur fracture.  Status post intramedullary fixation 12/11/2019.  Weightbearing as tolerated  Continue CIR 2.  Antithrombotics:             -DVT/anticoagulation: SCDs, hold off on chemoprophylaxis given red urine   Dopplers negative for lower extremity DVT             -antiplatelet therapy: Aspirin 81 BID 3. Pain Management: Robaxin and hydrocodone as needed  Muscle rub prn   Controlled with meds on 11/15  Monitor with increased mobility 4. Mood: Team support             -antipsychotic agents: N/A 5. Neuropsych: This patient is not fully capable of making decisions on his own behalf, suspect baseline dementia. 6. Skin/Wound Care: Routine skin checks  Xeroform to b/l buttock ulcers-healing 7. Fluids/Electrolytes/Nutrition: Routine in and outs. 8.  Acute blood loss anemia.    Hemoglobin 9.8 on 11/13,?  Reliability, labs ordered for tomorrow  Hemoccult positive on 11/13 9.  Seizure disorder.    Keppra 1250 mg twice daily.  EEG negative  No breakthrough seizures  from admission to rehab-11/15 10. Rectal adenocarcinoma.  Follow-up oncology service with Dr. Annamaria Boots as well as radiation oncology Dr. Lisbeth Renshaw.  Radiation treatments currently on hold after hip fracture and Xeloda has been stopped for now.  Appreciate heme/onc recs 11.  Hypokalemia  Potassium 4.1 on 11/15  Magnesium within normal limits on 11/15  Daily supplementation started on 11/10, increased on 11/12, decreased on 11/16  Continue to monitor 12.  Severe hypoalbuminemia  Supplement initiated on 11/10 13.  Red urine-?  Improving  UA + hemoglobin, urine culture NG  Will ask Urology to evaluate, awaiting recs 14.  GI  Loose stools-?  Secondary to chemo +/- dietary  Fiber ordered, increased on 11/12  Probiotic ordered on 11/12  Encouraged avoiding dairy   Simethicone started on 11/12  Improving.   LOS: 6 days A FACE TO FACE EVALUATION WAS PERFORMED  Simrin Vegh Lorie Phenix 12/21/2019, 4:56 PM

## 2019-12-21 NOTE — Consult Note (Signed)
Neuropsychological Consultation   Patient:   Carlos [name]   DOB:   05-Aug-1945  MR Number:  [id number]  Location:  [facility] [address] CENTER B [place] [id number] [place] [address] Dept: [place]: [phone]           Date of Service:   12/21/2019  Start Time:   9 AM End Time:   10 AM  Provider/Observer:  Ilean Skill, Psy.D.       Clinical Neuropsychologist       Billing Code/Service: 48546  Chief Complaint:    Carlos [name] is a 74 year old right-handed male with history of epilepsy maintained on Keppra, rectal adenocarcinoma, BPH, anxiety and history of mild cognitive disorder.  Patient presented on 12/08/2019 to [facility] long hospital with reported breakthrough seizure after radiation treatment.  Patient was treated with Ativan IM and loaded with Keppra.  Cranial CT scan negative.  While patient was at [facility] long ED he sustained another seizure and fell out of the bed without loss of consciousness landing on his left hip.  Imaging revealed left femur fracture.  Patient underwent fixation surgery on 12/11/2019.  Weightbearing as tolerated.  Patient was admitted to the comprehensive inpatient rehabilitation program.  Reason for Service:  Patient was referred for neuropsychological consultation due to history of anxiety and assessment due to some mild cognitive deficits.  Below is the HPI for the current admission.  HPI: Carlos [name] is a 74 year old right-handed male with history of epilepsy maintained on Keppra, rectal adenocarcinoma maintained on Xeloda receiving radiation therapy followed by oncology services Dr.Feng as well as radiation oncology Dr. Lisbeth Renshaw, BPH, anxiety as well as history of mild cognitive disorder. Per chart review patient lives with his 49 year old mother. Independent prior to admission working at the [employer]. His mother has 24-hour personal care attendants. Patient  reportedly has good support of local family.  Presented 12/08/2019 to [facility] long hospital with reported breakthrough seizure after radiation treatment.  He was treated with Ativan IM and loaded with Keppra.  Cranial CT scan negative.  Admission chemistries potassium 3.0 glucose 136, hemoglobin 12.2 WBC 3.7, ammonia level 182 question false positive and repeated 24, lactic acid 1.9.  EEG with no seizure.  While patient was at [facility] long ED he did sustain another seizure and fell out of the bed without loss of consciousness landing on his left hip.  X-rays and imaging revealed left intertrochanteric femur fracture.  Patient underwent intramedullary fixation 12/11/2019 per Dr. Lyla Glassing.  Weightbearing as tolerated.  Maintained on aspirin 325 mg daily for DVT prophylaxis.  Acute blood loss anemia 8.4 and monitored.  Therapy evaluations completed and patient was admitted for a comprehensive rehab program.  Current Status:  Upon entering the room, the patient was sitting upright in his bed and was well oriented.  He was oriented x4 and was well aware of the events leading up to his hospitalization and have all the events during his hospitalization other than immediately around his seizure activity.  The patient denied any significant pain in his left hip currently and acknowledged some weightbearing activities.  Patient reports that he is coping adequately with his current hospitalization.  Patient is expressive and receptive language appear to be intact, good information processing speed and adequate to good overall cognitive functioning.  The patient's short-term and long-term memory abilities all appear to be within normal limits and there were no indications of any significant cognitive deficits noted during  today's 1 hour long visit.  The patient showed good executive functioning and the suspicion is that most of his cognitive issues are directly related to pre and post ictal seizure activity episodes.  The patient  is continued to work right up until recently and plans on returning to work at the new year if he can be cleared due to his seizure activities, fractured hip and cancer treatments.  Behavioral Observation: Carlos [name]  presents as a 74 y.o.-year-old Right African American Male who appeared his stated age. his dress was Appropriate and he was Well Groomed and his manners were Appropriate to the situation.  his participation was indicative of Appropriate and Attentive behaviors.  There were any physical disabilities noted.  he displayed an appropriate level of cooperation and motivation.     Interactions:    Active Appropriate and Attentive  Attention:   within normal limits and attention span and concentration were age appropriate  Memory:   within normal limits; recent and remote memory intact  Visuo-spatial:  not examined  Speech (Volume):  normal  Speech:   normal; normal  Thought Process:  Coherent and Relevant  Though Content:  WNL; not suicidal and not homicidal  Orientation:   person, place, time/date and situation  Judgment:   Good  Planning:   Good  Affect:    Appropriate  Mood:    Euthymic  Insight:   Good  Intelligence:   high  Medical History:   Past Medical History:  Diagnosis Date  . Anxiety   . Benign prostatic hyperplasia 03/30/2013   10/1 IMO update  . Cancer ([place])   . 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 Seizures ([place])     Psychiatric History:  No prior significant psychiatric history although he does have a history of some anxiety but given the significant medical issues including his rectal cancer, recent hip fracture and seizure activity.  Family Med/Psych History:  Family History  Problem Relation Age of Onset  . Osteoporosis Mother   . Dementia Mother   . Diabetes Mother   . Brain cancer Father    Impression/DX:  Carlos [name] is a 74 year old right-handed male with history of epilepsy maintained on  Keppra, rectal adenocarcinoma, BPH, anxiety and history of mild cognitive disorder.  Patient presented on 12/08/2019 to [facility] long hospital with reported breakthrough seizure after radiation treatment.  Patient was treated with Ativan IM and loaded with Keppra.  Cranial CT scan negative.  While patient was at [facility] long ED he sustained another seizure and fell out of the bed without loss of consciousness landing on his left hip.  Imaging revealed left femur fracture.  Patient underwent fixation surgery on 12/11/2019.  Weightbearing as tolerated.  Patient was admitted to the comprehensive inpatient rehabilitation program.  Upon entering the room, the patient was sitting upright in his bed and was well oriented.  He was oriented x4 and was well aware of the events leading up to his hospitalization and have all the events during his hospitalization other than immediately around his seizure activity.  The patient denied any significant pain in his left hip currently and acknowledged some weightbearing activities.  Patient reports that he is coping adequately with his current hospitalization.  Patient is expressive and receptive language appear to be intact, good information processing speed and adequate to good overall cognitive functioning.  The patient's short-term and long-term memory abilities all appear to be within normal limits and there were no indications of  any significant cognitive deficits noted during today's 1 hour long visit.  The patient showed good executive functioning and the suspicion is that most of his cognitive issues are directly related to pre and post ictal seizure activity episodes.  The patient has continued to work right up until recently and plans on returning to work at the new year if he can be cleared due to his seizure activities, fractured hip and cancer treatments.  Patient had an MRI conducted on 01/05/2020 with no acute process noted.  There were some minimal hyperintensities  within the white matter consistent with chronic microvascular ischemic disease (mild) and mild generalized cerebral atrophy but generally consistent with age.  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  Mild cognitive changes potentially related to seizure activity and mild chronic microvascular ischemic changes as his cognitive function appeared to be generally intact with only mild issues and the patient clearly does not show any issues of degenerative dementia processes.  Diagnosis:    Hip fracture and recent seizures.        Electronically Signed   _______________________ Ilean Skill, Psy.D.

## 2019-12-22 ENCOUNTER — Encounter: Payer: 59 | Admitting: Physical Therapy

## 2019-12-22 ENCOUNTER — Inpatient Hospital Stay (HOSPITAL_COMMUNITY): Payer: 59

## 2019-12-22 ENCOUNTER — Inpatient Hospital Stay (HOSPITAL_COMMUNITY): Payer: 59 | Admitting: Occupational Therapy

## 2019-12-22 DIAGNOSIS — E876 Hypokalemia: Secondary | ICD-10-CM | POA: Diagnosis not present

## 2019-12-22 DIAGNOSIS — S72145D Nondisplaced intertrochanteric fracture of left femur, subsequent encounter for closed fracture with routine healing: Secondary | ICD-10-CM | POA: Diagnosis not present

## 2019-12-22 DIAGNOSIS — D62 Acute posthemorrhagic anemia: Secondary | ICD-10-CM | POA: Diagnosis not present

## 2019-12-22 DIAGNOSIS — M25552 Pain in left hip: Secondary | ICD-10-CM | POA: Diagnosis not present

## 2019-12-22 LAB — CBC
HCT: 32.1 % — ABNORMAL LOW (ref 39.0–52.0)
Hemoglobin: 10.2 g/dL — ABNORMAL LOW (ref 13.0–17.0)
MCH: 30.4 pg (ref 26.0–34.0)
MCHC: 31.8 g/dL (ref 30.0–36.0)
MCV: 95.8 fL (ref 80.0–100.0)
Platelets: 316 10*3/uL (ref 150–400)
RBC: 3.35 MIL/uL — ABNORMAL LOW (ref 4.22–5.81)
RDW: 16.4 % — ABNORMAL HIGH (ref 11.5–15.5)
WBC: 4.5 10*3/uL (ref 4.0–10.5)
nRBC: 0 % (ref 0.0–0.2)

## 2019-12-22 MED ORDER — ENOXAPARIN SODIUM 40 MG/0.4ML ~~LOC~~ SOLN
40.0000 mg | SUBCUTANEOUS | Status: DC
  Administered 2019-12-22 – 2019-12-30 (×9): 40 mg via SUBCUTANEOUS
  Filled 2019-12-22 (×9): qty 0.4

## 2019-12-22 NOTE — Progress Notes (Addendum)
ANTICOAGULATION CONSULT NOTE - Initial Consult  Pharmacy Consult for enoxaparin Indication: VTE prophylaxis  No Known Allergies  Patient Measurements: Height: 5\' 7"  (170.2 cm) Weight: 57 kg (125 lb 10.6 oz) IBW/kg (Calculated) : 66.1   Vital Signs: Temp: 98.2 F (36.8 C) (11/16 0328) Temp Source: Oral (11/16 0328) BP: 128/63 (11/16 0328) Pulse Rate: 89 (11/16 0328)  Labs: Recent Labs    12/21/19 0945  HGB 9.8*  HCT 30.6*  PLT 292  CREATININE 0.84    Estimated Creatinine Clearance: 62.2 mL/min (by C-G formula based on SCr of 0.84 mg/dL).   Medical History: Past Medical History:  Diagnosis Date  . Anxiety   . Benign prostatic hyperplasia 03/30/2013   10/1 IMO update  . Cancer ([place])   . 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 Seizures [facility])       Assessment: 74 yo M with L intertrochanteric femur fracture s/p ORIF 11/5 and admitted to [facility] on 11/9. Pharmacy is consulted for enoxaparin DVT prophylaxis.   Patient was on aspirin for DVT prophylaxis post-op per ortho and on enoxaparin for 3 days as well. H/H, plt stable. Enox held previously for red urine. Ok to start enoxaparin 40 mg daily.    Goal of Therapy:  Monitor platelets by anticoagulation protocol: Yes   Plan:  Start enoxaparin 40mg  daily Monitor for signs/symptoms of bleeding   Benetta Spar, PharmD, BCPS, BCCP Clinical Pharmacist  Please check AMION for all [place] phone numbers After 10:00 PM, call [place] [phone]

## 2019-12-22 NOTE — Progress Notes (Signed)
Physical Therapy Weekly Progress Note  Patient Details  Name: Carlos [name] MRN: [id number] Date of Birth: 20-Feb-1945  Beginning of progress report period: December 16, 2019 End of progress report period: December 22, 2019  Today's Date: 12/22/2019 PT Individual Time: 8315-1761 PT Individual Time Calculation (min): 54 min   Patient has met 3 of 3 short term goals. Pt demonstrates steady progress towards long term goals. Pt currently able to perform bed mobility with supervision/min A depending on pain and fatigue, transfers with RW and CGA, gait >144f with RW and CGA, and 4 steps with 2 rails min A. Pt continues to be limited by pain and stiffness in LLE, decreased L hip flexor strength and ROM, decreased balance/postural control, decreased endurance, and poor short term memory and safety awareness.   Patient continues to demonstrate the following deficits muscle weakness, decreased awareness, decreased problem solving and decreased memory and decreased standing balance, decreased postural control and decreased balance strategies and therefore will continue to benefit from skilled PT intervention to increase functional independence with mobility.  Patient progressing toward long term goals..  Continue plan of care.  PT Short Term Goals Week 1:  PT Short Term Goal 1 (Week 1): Pt will perform stand<>pivot transfers with LRAD CGA PT Short Term Goal 1 - Progress (Week 1): Met PT Short Term Goal 2 (Week 1): Pt will ambulate 786fwith LRAD CGA PT Short Term Goal 2 - Progress (Week 1): Met PT Short Term Goal 3 (Week 1): Pt will perform simulated car transfer with LRAD and CGA PT Short Term Goal 3 - Progress (Week 1): Met Week 2:  PT Short Term Goal 1 (Week 2): STG=LTG due to LOS  Skilled Therapeutic Interventions/Progress Updates:  Ambulation/gait training;Discharge planning;Functional mobility training;Psychosocial support;Therapeutic Activities;Balance/vestibular training;Disease  management/prevention;Neuromuscular re-education;Skin care/wound management;Therapeutic Exercise;Wheelchair propulsion/positioning;Cognitive remediation/compensation;DME/adaptive equipment instruction;Pain management;UE/LE Strength taining/ROM;Community reintegration;Functional electrical stimulation;Patient/family education;Stair training;UE/LE Coordination activities   Today's Interventions: Received pt supine in bed, pt agreeable to therapy, and denied any pain at rest but reported "stiffness" with functional mobility. Session with emphasis on functional mobility/transfers, generalized strengthening, dynamic standing balance/coordination, gait training, and improved activity tolerance. Donned non-skid socks total A and pt transferred supine<>sitting EOB with HOB elevated and use of bedrails and supervision. Pt reported urge to urinate and began to attempt to ambulate without AD. When therapist asked why pt wasn't using RW pt stated "well at home I can walk around without it." Therapist explained to pt reasoning behind using RW for safety and reminded pt that due to pain, L hip weakness, and stiffness ambulating without AD increases fall risk and uses more energy. Therapist encouraged pt to try ambulating to bathroom without AD to demonstrate to pt why RW is recommended. Pt unable to advance LLE forward to step and then stated "oh yeah I see why I need the walker." Pt ambulated 1040f 2 trials to/from bathroom with RW and CGA. Pt unable to void while sitting on toilet and requested to stand; however pt still unsuccessful. Pt able to manage clothing standing with CGA. Pt washed hands and brushed teeth standing at sink with close supervision. Pt required cues to spit into sink as pt holding water in mouth. When therapist asked why pt didn't spit in the sink pt stated "well you're supposed to spit in the commode." Pt ambulated [id number] and 75ft35f with RW and CGA. Pt required cues for upright posture/gaze and  for stepping sequence and technique however pt with increased difficulty  understanding cues and with poor motor planning and sequencing. Pt demonstrated either L hip hiking/circumduction or excessive L knee flexion resulting in steppage/vaulting gait pattern. Pt unable to correct gait pattern despite cues. MD present for morning rounds and pt asking questions regarding using muscle rubs (although therapist previously discussed this with pt). Pt transported to dayroom in [facility] total A and transferred stand<>pivot WC<>Nustep with RW and CGA with cues for RW safety as pt pushing RW to the side mid-transfer. Pt performed BLE strengthening on Nustep at workload 1 for 10 minutes for improved cardiovascular endurance and with emphasis on L hip flexor strengthening. Pt required extended rest break afterwards. Pt transported back to room in [facility] total A and ambulated 77f with RW and close supervision to recliner. Concluded session with pt sitting in recliner on Roho cushion for skin integrity, needs within reach, and seatbelt alarm on.   Therapy Documentation Precautions:  Precautions Precautions: Fall Precaution Comments: seizures Restrictions Weight Bearing Restrictions: No LUE Weight Bearing: Weight bearing as tolerated LLE Weight Bearing: Weight bearing as tolerated  Therapy/Group: Individual Therapy  AAlfonse AlpersPT, DPT  12/22/2019, 7:21 AM

## 2019-12-22 NOTE — Progress Notes (Signed)
Occupational Therapy Session Note  Patient Details  Name: Carlos [name] MRN: [id number] Date of Birth: [birth year]  Today's Date: 12/22/2019 OT Individual Time: 8032-1224 OT Individual Time Calculation (min): 73 min   Short Term Goals: Week 1:  OT Short Term Goal 1 (Week 1): Pt will complete LB dressing with min assist OT Short Term Goal 2 (Week 1): Pt will complete bathing with min assist OT Short Term Goal 3 (Week 1): Pt will complete toileting tasks with min assist  Skilled Therapeutic Interventions/Progress Updates:    Pt greeted seated in recliner and agreeable to OT treatment session. Pt declined any BADL tasks this afternoon stating " I just changed my pants, I don't need to do anything else." Pt also declined to use the bathroom. Pt completed sit<>stand from recliner with RW and CGA. CGA for ambulation to wc across the room. Pt brought to therapy gym and completed stand-pivot to NuStep with RW and CGA. Pt completed 10 mins on NuStep on level 4 without rest break. LB there-ex using heraband and ball with 3 sets of 10 hip abduction/adduction, seated knee extension, and seated hip extension. Worked on standing balance/endurance standing on foam while participating in [employer] activity.  Worked on incorporating dual task while pushing Dyn[employer] buttons and engaging in conversation. Pt with short term memory deficits throughout session requiring cues throughout session to recall the reason why we were doing activities. Pt returned to room at end of session and ambulated to recliner with RW and CGA. Pt left seated in recliner with alarm belt on, call bell in reach, and needs met.   Therapy Documentation Precautions:  Precautions Precautions: Fall Precaution Comments: seizures Restrictions Weight Bearing Restrictions: Yes LUE Weight Bearing: Weight bearing as tolerated LLE Weight Bearing: Weight bearing as tolerated Pain: Pt reported 3/10 pain in LLE. Repositioned and rest for pain  management.  Therapy/Group: Individual Therapy  Valma Cava 12/22/2019, 1:20 PM

## 2019-12-22 NOTE — Progress Notes (Signed)
[place] PHYSICAL MEDICINE & REHABILITATION PROGRESS NOTE  Subjective/Complaints: Patient seen ambulating and later sitting with therapies this morning.  He states he slept well overnight.  Discussed patient's left lower extremity weakness with patient and therapies as well as gait.  He notes improvement in abdominal discomfort.  Discussed muscle rub again with patient.  ROS: Denies CP, SOB, N/V/D  Objective: Vital Signs: Blood pressure 128/63, pulse 89, temperature 98.2 F (36.8 C), temperature source Oral, resp. rate 16, height 5\' 7"  (1.702 m), weight 57 kg, SpO2 99 %. No results found. Recent Labs    12/21/19 0945  WBC 4.8  HGB 9.8*  HCT 30.6*  PLT 292   Recent Labs    12/21/19 0945  NA 138  K 4.1  CL 102  CO2 26  GLUCOSE 91  BUN 11  CREATININE 0.84  CALCIUM 8.9    Intake/Output Summary (Last 24 hours) at 12/22/2019 0856 Last data filed at 12/21/2019 1700 Gross per 24 hour  Intake 540 ml  Output --  Net 540 ml     Pressure Injury 12/15/19 Buttocks Left Stage 2 -  Partial thickness loss of dermis presenting as a shallow open injury with a red, pink wound bed without slough. ruptured blister first documented as a skin tear measured 4.3 x 4 x 0cm (Active)  12/15/19   Location: Buttocks  Location Orientation: Left  Staging: Stage 2 -  Partial thickness loss of dermis presenting as a shallow open injury with a red, pink wound bed without slough.  Wound Description (Comments): ruptured blister first documented as a skin tear measured 4.3 x 4 x 0cm  Present on Admission: Yes     Pressure Injury 12/15/19 Buttocks Right Stage 2 -  Partial thickness loss of dermis presenting as a shallow open injury with a red, pink wound bed without slough. noted on admission documented as a skin tear measured 4.3 x 6.2 x 0cm (Active)  12/15/19   Location: Buttocks  Location Orientation: Right  Staging: Stage 2 -  Partial thickness loss of dermis presenting as a shallow open injury  with a red, pink wound bed without slough.  Wound Description (Comments): noted on admission documented as a skin tear measured 4.3 x 6.2 x 0cm  Present on Admission: Yes    Physical Exam: BP 128/63 (BP Location: Right Arm)   Pulse 89   Temp 98.2 F (36.8 C) (Oral)   Resp 16   Ht 5\' 7"  (1.702 m)   Wt 57 kg   SpO2 99%   BMI 19.68 kg/m  Constitutional: No distress . Vital signs reviewed. HENT: Normocephalic.  Atraumatic. Eyes: EOMI. No discharge. Cardiovascular: No JVD.  RRR. Respiratory: Normal effort.  No stridor.  Bilateral clear to auscultation. GI: Non-distended.  BS +. Skin: Warm and dry.  Left hip with dressing CDI Discoloration bilateral hands Buttock wounds not evaluated today. Psych: Confused.  Tangential at times. Musc: Left hip with edema and tenderness. Generalized muscle wasting noted. Neuro: Alert Motor: Left lower extremity: Hip flexion 2/5, knee extension 4/5, ankle dorsiflexion 4/5, stable  Assessment/Plan: 1. Functional deficits which require 3+ hours per day of interdisciplinary therapy in a comprehensive inpatient rehab setting.  Physiatrist is providing close team supervision and 24 hour management of active medical problems listed below.  Physiatrist and rehab team continue to assess barriers to discharge/monitor patient progress toward functional and medical goals   Care Tool:  Bathing    Body parts bathed by patient: Right arm, Left arm,  Chest, Abdomen, Face, Buttocks, Front perineal area, Right upper leg, Left upper leg, Right lower leg, Left lower leg   Body parts bathed by helper: Front perineal area, Buttocks, Right upper leg, Left upper leg, Right lower leg, Left lower leg     Bathing assist Assist Level: Supervision/Verbal cueing     Upper Body Dressing/Undressing Upper body dressing   What is the patient wearing?: Pull over shirt    Upper body assist Assist Level: Set up assist    Lower Body Dressing/Undressing Lower body  dressing      What is the patient wearing?: Pants     Lower body assist Assist for lower body dressing: Minimal Assistance - Patient > 75%     Toileting Toileting    Toileting assist Assist for toileting: Contact Guard/Touching assist     Transfers Chair/bed transfer  Transfers assist     Chair/bed transfer assist level: Contact Guard/Touching assist Chair/bed transfer assistive device: Programmer, multimedia   Ambulation assist      Assist level: Contact Guard/Touching assist Assistive device: Walker-rolling Max distance: 171ft   Walk 10 feet activity   Assist     Assist level: Contact Guard/Touching assist Assistive device: Walker-rolling   Walk 50 feet activity   Assist Walk 50 feet with 2 turns activity did not occur: Safety/medical concerns (LLE pain, generalized weakness, decreased balance/postural control)  Assist level: Contact Guard/Touching assist Assistive device: Walker-rolling    Walk 150 feet activity   Assist Walk 150 feet activity did not occur: Safety/medical concerns (LLE pain, generalized weakness, decreased balance/postural control)  Assist level: Contact Guard/Touching assist Assistive device: Walker-rolling    Walk 10 feet on uneven surface  activity   Assist     Assist level: Moderate Assistance - Patient - 50 - 74% Assistive device: Other (comment) (R handrail)   Wheelchair     Assist Will patient use wheelchair at discharge?: No Type of Wheelchair: Manual    Wheelchair assist level: Supervision/Verbal cueing Max wheelchair distance: 163ft    Wheelchair 50 feet with 2 turns activity    Assist        Assist Level: Supervision/Verbal cueing   Wheelchair 150 feet activity     Assist      Assist Level: Supervision/Verbal cueing    Medical Problem List and Plan: 1.  Decreased functional mobility secondary to left intertrochanteric femur fracture. Status post intramedullary  fixation 12/11/2019.  Weightbearing as tolerated  Continue CIR  Attempted to call family, no answer-voicemail box full.  Discussed with patient. 2.  Antithrombotics:             -DVT/anticoagulation: SCDs, Lovenox ordered-monitor for bleeding   Dopplers negative for lower extremity DVT             -antiplatelet therapy: Aspirin 81 BID 3. Pain Management: Robaxin and hydrocodone as needed  Muscle rub prn   Controlled with meds on 11/16  Monitor with increased mobility 4. Mood: Team support             -antipsychotic agents: N/A 5. Neuropsych: This patient is not fully capable of making decisions on his own behalf, suspect baseline dementia. 6. Skin/Wound Care: Routine skin checks  Continue Xeroform to b/l buttock ulcers-healing 7. Fluids/Electrolytes/Nutrition: Routine in and outs. 8.  Acute blood loss anemia.    Hemoglobin 9.8 on 11/13,?  Reliability, labs pending  Hemoccult positive on 11/13-see # 9.  Seizure disorder.    Keppra 1250 mg twice daily.  EEG  negative  No breakthrough seizures from admission to rehab-11/16 10. Rectal adenocarcinoma.  Follow-up oncology service with Dr. Annamaria Boots as well as radiation oncology Dr. Lisbeth Renshaw.  Radiation treatments currently on hold after hip fracture and Xeloda has been stopped for now.  Appreciate heme/onc recs 11.  Hypokalemia  Potassium 4.1 on 11/15  Magnesium within normal limits on 11/15  Daily supplementation started on 11/10, increased on 11/12, decreased on 11/16  Continue to monitor 12.  Severe hypoalbuminemia  Supplement initiated on 11/10 13.  Red urine-?  Resolved  UA + hemoglobin, urine culture NG  Will ask Urology to evaluate, awaiting recs 14.  GI  Loose stools with "gurgling"/discomfort -?  Secondary to chemo +/- dietary  Fiber ordered, increased on 11/12  Probiotic ordered on 11/12  Encouraged avoiding dairy   Simethicone started on 11/12  Improved.   LOS: 7 days A FACE TO FACE EVALUATION WAS PERFORMED  Rondrick Barreira Lorie Phenix 12/22/2019, 8:56 AM

## 2019-12-22 NOTE — Progress Notes (Signed)
Speech Language Pathology Daily Session Note  Patient Details  Name: CLIF SERIO MRN: [id number] Date of Birth: September 25, 1945  Today's Date: 12/22/2019 SLP Individual Time: 5361-4431 SLP Individual Time Calculation (min): 57 min  Short Term Goals: Week 1: SLP Short Term Goal 1 (Week 1): Pt will demonstrate selective attention to functional tasks for 10 minutes in moderately distracting environment with supervision verbal cues for redirection. SLP Short Term Goal 2 (Week 1): Pt will recall rehab techniques or 2 events from previous therapy session with mod A using external aids. SLP Short Term Goal 3 (Week 1): Pt will complete functional mildly complex problem solving tasks with mod A verbal cues   Skilled Therapeutic Interventions:Skilled ST services focused on cognitive skills. Pt was in bathroom with nursing present when ST enter room. Pt required min A verbal cues for safety awareness (ambulating at a fast rate and mindfulness of RW) when ambulating around the room. SLP facilitated complex problem solving and higher level attention skills in completion of QID pill organizer task. Pt required initial supervision A verbal cues for problem solving to fill out organizer for entire week verse one day, however quickly faded to mod I. While pt was very verbose, he was able to demonstrate alternating attention and emerging divided attention when completing pill organizer and engaging in mildly complex conversation. Pt was able to recall x1 safety protocol when ambulating, following instruction and pt witting down steps he was able to recall 2 out 3 with min A verbal cues. Pt requested to use the bathroom, SLP assisted pt with RW and provided supervision A verbal cues to keep the walker in front of him at all times. Pt is not responsible for money management and keeping track of medical appointments (his sister does this), but appeared to provide a appropriate and detailed sequences of daily events. SLP  will continue focusing on recall, higher level attention and complex problem solving skills in incoming treatment sessions. Pt was left in room with call bell within reach and chair alarm set. SLP recommends to continue skilled services.     Pain Pain Assessment Pain Scale: 0-10 Pain Score: 0-No pain  Therapy/Group: Individual Therapy  Finis Hendricksen  [facility] 12/22/2019, 10:45 AM

## 2019-12-23 ENCOUNTER — Inpatient Hospital Stay (HOSPITAL_COMMUNITY): Payer: 59 | Admitting: Occupational Therapy

## 2019-12-23 ENCOUNTER — Inpatient Hospital Stay (HOSPITAL_COMMUNITY): Payer: 59

## 2019-12-23 DIAGNOSIS — G8918 Other acute postprocedural pain: Secondary | ICD-10-CM | POA: Diagnosis not present

## 2019-12-23 DIAGNOSIS — D62 Acute posthemorrhagic anemia: Secondary | ICD-10-CM | POA: Diagnosis not present

## 2019-12-23 DIAGNOSIS — M25552 Pain in left hip: Secondary | ICD-10-CM | POA: Diagnosis not present

## 2019-12-23 DIAGNOSIS — S72145D Nondisplaced intertrochanteric fracture of left femur, subsequent encounter for closed fracture with routine healing: Secondary | ICD-10-CM | POA: Diagnosis not present

## 2019-12-23 NOTE — Progress Notes (Signed)
[place] PHYSICAL MEDICINE & REHABILITATION PROGRESS NOTE  Subjective/Complaints: Patient seen sitting up in his chair this morning.  He states he slept well overnight.  He states he is working on lowering his left lower extremity.  Received message from social worker regarding sister request for call back.  Attempted to call again, no answer-left voicemail.  ROS: Denies CP, SOB, N/V/D  Objective: Vital Signs: Blood pressure 124/70, pulse 80, temperature 98.2 F (36.8 C), temperature source Oral, resp. rate 18, height 5\' 7"  (1.702 m), weight 57.9 kg, SpO2 100 %. No results found. Recent Labs    12/21/19 0945 12/22/19 0915  WBC 4.8 4.5  HGB 9.8* 10.2*  HCT 30.6* 32.1*  PLT 292 316   Recent Labs    12/21/19 0945  NA 138  K 4.1  CL 102  CO2 26  GLUCOSE 91  BUN 11  CREATININE 0.84  CALCIUM 8.9    Intake/Output Summary (Last 24 hours) at 12/23/2019 1000 Last data filed at 12/22/2019 1700 Gross per 24 hour  Intake 240 ml  Output --  Net 240 ml     Pressure Injury 12/15/19 Buttocks Left Stage 2 -  Partial thickness loss of dermis presenting as a shallow open injury with a red, pink wound bed without slough. ruptured blister first documented as a skin tear measured 4.3 x 4 x 0cm (Active)  12/15/19   Location: Buttocks  Location Orientation: Left  Staging: Stage 2 -  Partial thickness loss of dermis presenting as a shallow open injury with a red, pink wound bed without slough.  Wound Description (Comments): ruptured blister first documented as a skin tear measured 4.3 x 4 x 0cm  Present on Admission: Yes     Pressure Injury 12/15/19 Buttocks Right Stage 2 -  Partial thickness loss of dermis presenting as a shallow open injury with a red, pink wound bed without slough. noted on admission documented as a skin tear measured 4.3 x 6.2 x 0cm (Active)  12/15/19   Location: Buttocks  Location Orientation: Right  Staging: Stage 2 -  Partial thickness loss of dermis presenting  as a shallow open injury with a red, pink wound bed without slough.  Wound Description (Comments): noted on admission documented as a skin tear measured 4.3 x 6.2 x 0cm  Present on Admission: Yes    Physical Exam: BP 124/70 (BP Location: Right Arm)   Pulse 80   Temp 98.2 F (36.8 C) (Oral)   Resp 18   Ht 5\' 7"  (1.702 m)   Wt 57.9 kg   SpO2 100%   BMI 19.99 kg/m  Constitutional: No distress . Vital signs reviewed. HENT: Normocephalic.  Atraumatic. Eyes: EOMI. No discharge. Cardiovascular: No JVD.  RRR. Respiratory: Normal effort.  No stridor.  Bilateral clear to auscultation. GI: Non-distended.  BS +. Skin: Warm and dry.  Left hip with dressing CDI Discoloration bilateral hands Buttock wounds not evaluated today Psych: Tangential at times. Musc: Left hip with edema and tenderness, improving. Generalized muscle wasting noted Neuro: Alert Motor: Left lower extremity: Hip flexion 2/5, knee extension 4/5, ankle dorsiflexion 4/5, unchanged  Assessment/Plan: 1. Functional deficits which require 3+ hours per day of interdisciplinary therapy in a comprehensive inpatient rehab setting.  Physiatrist is providing close team supervision and 24 hour management of active medical problems listed below.  Physiatrist and rehab team continue to assess barriers to discharge/monitor patient progress toward functional and medical goals   Care Tool:  Bathing    Body parts  bathed by patient: Right arm, Left arm, Chest, Abdomen, Face, Buttocks, Front perineal area, Right upper leg, Left upper leg, Right lower leg, Left lower leg   Body parts bathed by helper: Front perineal area, Buttocks, Right upper leg, Left upper leg, Right lower leg, Left lower leg     Bathing assist Assist Level: Supervision/Verbal cueing     Upper Body Dressing/Undressing Upper body dressing   What is the patient wearing?: Pull over shirt    Upper body assist Assist Level: Set up assist    Lower Body  Dressing/Undressing Lower body dressing      What is the patient wearing?: Pants     Lower body assist Assist for lower body dressing: Minimal Assistance - Patient > 75%     Toileting Toileting    Toileting assist Assist for toileting: Contact Guard/Touching assist     Transfers Chair/bed transfer  Transfers assist     Chair/bed transfer assist level: Contact Guard/Touching assist Chair/bed transfer assistive device: Programmer, multimedia   Ambulation assist      Assist level: Contact Guard/Touching assist Assistive device: Walker-rolling Max distance: 158ft   Walk 10 feet activity   Assist     Assist level: Contact Guard/Touching assist Assistive device: Walker-rolling   Walk 50 feet activity   Assist Walk 50 feet with 2 turns activity did not occur: Safety/medical concerns (LLE pain, generalized weakness, decreased balance/postural control)  Assist level: Contact Guard/Touching assist Assistive device: Walker-rolling    Walk 150 feet activity   Assist Walk 150 feet activity did not occur: Safety/medical concerns (LLE pain, generalized weakness, decreased balance/postural control)  Assist level: Contact Guard/Touching assist Assistive device: Walker-rolling    Walk 10 feet on uneven surface  activity   Assist     Assist level: Moderate Assistance - Patient - 50 - 74% Assistive device: Other (comment) (R handrail)   Wheelchair     Assist Will patient use wheelchair at discharge?: No Type of Wheelchair: Manual    Wheelchair assist level: Supervision/Verbal cueing Max wheelchair distance: 165ft    Wheelchair 50 feet with 2 turns activity    Assist        Assist Level: Supervision/Verbal cueing   Wheelchair 150 feet activity     Assist      Assist Level: Supervision/Verbal cueing    Medical Problem List and Plan: 1.  Decreased functional mobility secondary to left intertrochanteric femur fracture.  Status post intramedullary fixation 12/11/2019.  Weightbearing as tolerated  Continue CIR  Attempted to call family, no answer-voicemail box full.  Called again, no answer-left voicemail.  Team conference today to discuss current and goals and coordination of care, home and environmental barriers, and discharge planning with nursing, case manager, and therapies. Please see conference note from today as well.  2.  Antithrombotics:             -DVT/anticoagulation: SCDs, Lovenox ordered-monitor for bleeding   Dopplers negative for lower extremity DVT             -antiplatelet therapy: Aspirin 81 BID 3. Pain Management: Robaxin and hydrocodone as needed  Muscle rub prn   Controlled with meds on 11/17  Monitor with increased mobility 4. Mood: Team support             -antipsychotic agents: N/A 5. Neuropsych: This patient is not fully capable of making decisions on his own behalf, suspect baseline dementia. 6. Skin/Wound Care: Routine skin checks  Continue Xeroform to b/l buttock  ulcers-healing 7. Fluids/Electrolytes/Nutrition: Routine in and outs. 8.  Acute blood loss anemia.    Hemoglobin 10.2 on 11/16  Hemoccult positive on 11/13-see #10 9.  Seizure disorder.    Keppra 1250 mg twice daily.  EEG negative  No breakthrough seizures from admission to rehab-11/17 10. Rectal adenocarcinoma.  Follow-up oncology service with Dr. Annamaria Boots as well as radiation oncology Dr. Lisbeth Renshaw.  Radiation treatments currently on hold after hip fracture and Xeloda has been stopped for now.  Appreciate heme/onc recs 11.  Hypokalemia  Potassium 4.1 on 11/15  Magnesium within normal limits on 11/15  Daily supplementation started on 11/10, increased on 11/12, decreased on 11/16, plan order labs at the end of this week  Continue to monitor 12.  Severe hypoalbuminemia  Supplement initiated on 11/10 13.  Red urine-?  Resolved  UA + hemoglobin, urine culture NG 14.  GI  Loose stools with "gurgling"/discomfort -?   Secondary to chemo +/- dietary  Fiber ordered, increased on 11/12  Probiotic ordered on 11/12  Encouraged avoiding dairy   Simethicone started on 11/12  Improved  LOS: 8 days A FACE TO FACE EVALUATION WAS PERFORMED  Vondra Aldredge Lorie Phenix 12/23/2019, 10:00 AM

## 2019-12-23 NOTE — Progress Notes (Signed)
Occupational Therapy Session Note  Patient Details  Name: Carlos [name] MRN: [id number] Date of Birth: 08-16-1945  Today's Date: 12/23/2019 OT Individual Time: 1120-1200 OT Individual Time Calculation (min): 40 min    Short Term Goals: Week 1:  OT Short Term Goal 1 (Week 1): Pt will complete LB dressing with min assist OT Short Term Goal 2 (Week 1): Pt will complete bathing with min assist OT Short Term Goal 3 (Week 1): Pt will complete toileting tasks with min assist  Skilled Therapeutic Interventions/Progress Updates:    Pt standing with RW for RN to change dressing on sacrum/buttocks. Pt stood approx 4 mins for procedure. Pt requrested use of toilet and amb with RW to bathroom. Pt stood at toilet to urinate. CGA for standing. Pt amb with RW to [facility] and engaged in 7 mins BLE therex on NuStep before engaging in [employer] activities-red lights 1 minX3 and red/green lights 1 minX3 with rest breaks. No LOB noted. Pt amb with RW back to room and sat in recliner. Pt remained in recliner with all needs within reach and belt alarm activated.   Therapy Documentation Precautions:  Precautions Precautions: Fall Precaution Comments: seizures Restrictions Weight Bearing Restrictions: Yes LUE Weight Bearing: Weight bearing as tolerated LLE Weight Bearing: Weight bearing as tolerated   Pain:  Pt c/o LLE soreness/stiffness; activity and repositioned   Therapy/Group: Individual Therapy  Leroy Libman 12/23/2019, 12:22 PM

## 2019-12-23 NOTE — Progress Notes (Signed)
Occupational Therapy Session Note  Patient Details  Name: Carlos [name] MRN: [id number] Date of Birth: [birth year]  Today's Date: 12/23/2019 OT Individual Time: 1300-1400 OT Individual Time Calculation (min): 60 min    Short Term Goals: Week 1:  OT Short Term Goal 1 (Week 1): Pt will complete LB dressing with min assist OT Short Term Goal 2 (Week 1): Pt will complete bathing with min assist OT Short Term Goal 3 (Week 1): Pt will complete toileting tasks with min assist  Skilled Therapeutic Interventions/Progress Updates:    1:1 PT received in the recliner when arrived. Pt ambulated into the bathroom with close supervision. Pt slightly impulsiveness in the bathroom but with contact guard remained safe. Able to ambulate from toilet to shower 4 feet without AD with contact guard with extra time. Min verbal cues to wash all parts. Pt does repeat himself a lot in the session. Pt able to perform sit to stands with supervision and standing balance with at least one Ue support. Pt expressed being concerned with "just getting up and moving." Pt returned to [facility] after shower with contextual cues to use RW with transitioning to toilet. Dressed sitting on toilet with A for LB dressing (unable to thread underwear and pants on left Le and unable to don socks). Pt continued to complain about soreness but wanted to continue to "work out." Pt ambulated from his room to the elevators at [facility] and after a rest break ambulated to the dayroom. Performed 5 min on the Nustep with level 3 for resistance for endurance and practice with symmetrical reciprocal  movement.   Therapy Documentation Precautions:  Precautions Precautions: Fall Precaution Comments: seizures Restrictions Weight Bearing Restrictions: Yes LUE Weight Bearing: Weight bearing as tolerated LLE Weight Bearing: Weight bearing as tolerated Pain:  no c/o pain just some stiffness from sitting a long time  Therapy/Group: Individual Therapy  Willeen Cass [facility] 12/23/2019, 1:20 PM

## 2019-12-23 NOTE — Progress Notes (Signed)
Physical Therapy Session Note  Patient Details  Name: Carlos [name] MRN: [id number] Date of Birth: 09-23-1945  Today's Date: 12/23/2019 PT Individual Time: 0823-0855 PT Individual Time Calculation (min): 32 min   Short Term Goals: Week 2:  PT Short Term Goal 1 (Week 2): STG=LTG due to LOS  Skilled Therapeutic Interventions/Progress Updates:    Patient received sitting up in bed, agreeable to PT session, requesting to use restroom. He denies pain when asked. Patient hyperverbal and difficult to redirect at times. Patient asking multiple times if he can soak in a bath at home. PT discussing poor safety associated with lowering into bath and then needing to complete, basically, a floor transfer while wet after bath. Patient also asking multiple times what piece of equipment he can purchase for home use to help his hip. PT educating patient on importance of moving to tolerance to aid in healing. Patient verbalized understanding at end of session. He was able to ambulate into bathroom with RW and CGA and was continent of bladder. Patient requesting to transport to gym in wc, PT propelled wc for time management and energy conservation. He was able to complete 10 mins on NuStep, B LE only, for improved L LE muscle activation, emphasis on full ROM as able. Patient ambulating back tohis room ~75ft with RW, CGA. He returned to recliner, ROHO cushion on seat, seatbelt alarm on, call light within reach.   Therapy Documentation Precautions:  Precautions Precautions: Fall Precaution Comments: seizures Restrictions Weight Bearing Restrictions: No LUE Weight Bearing: Weight bearing as tolerated LLE Weight Bearing: Weight bearing as tolerated    Therapy/Group: Individual Therapy  Karoline Caldwell, PT, DPT, CBIS  12/23/2019, 8:47 AM

## 2019-12-23 NOTE — Progress Notes (Signed)
Speech Language Pathology Daily Session Note  Patient Details  Name: [name] MRN: [id number] Date of Birth: [birth year]  Today's Date: 12/23/2019 SLP Individual Time: 1402-1430 SLP Individual Time Calculation (min): 28 min  Short Term Goals: Week 1: SLP Short Term Goal 1 (Week 1): Pt will demonstrate selective attention to functional tasks for 10 minutes in moderately distracting environment with supervision verbal cues for redirection. SLP Short Term Goal 2 (Week 1): Pt will recall rehab techniques or 2 events from previous therapy session with mod A using external aids. SLP Short Term Goal 3 (Week 1): Pt will complete functional mildly complex problem solving tasks with mod A verbal cues  Skilled Therapeutic Interventions:Skilled ST services focused on cognitive skills. SLP facilitated complex problem solving skills in simple deductive reasoning task, in which pt required max A verbal cues and mod A verbal cues when broken down step by step. Pt expressed a routine/basic lifestyle in which limited higher level cognitive skills required. SLP adjusted goals to mildly complex problem solving to reflect baseline function. Pt was able to complete a mildly complex problem solving and organization task (Occupational psychologist) with min A verbal cues for problem solving and supervision A verbal cues for recall strategies within task. Pt was left in room with call bell within reach and bed alarm set. SLP recommends to continue skilled services.     Pain Pain Assessment Pain Score: 0-No pain  Therapy/Group: Individual Therapy  Kaeya Schiffer  [facility] 12/23/2019, 5:09 PM

## 2019-12-23 NOTE — NC FL2 (Addendum)
[place] LEVEL OF CARE SCREENING TOOL     IDENTIFICATION  Patient Name: Carlos [name] Birthdate: 1946-02-05 Sex: male Admission Date (Current Location): 12/15/2019  [facility] and [place] Number:  Herbalist and Address:  The . [facility], [place] [address] [place], [place] [address]      Provider Number: [id number]  Attending Physician Name and Address:  Jamse Arn, MD  Relative Name and Phone Number:  Teodoro Kil [phone]    Current Level of Care: Hospital Recommended Level of Care: [place] Prior Approval Number:    Date Approved/Denied:   PASRR Number: [id number]  Discharge Plan: SNF    Current Diagnoses: Patient Active Problem List   Diagnosis Date Noted  . Abnormal urinalysis   . Post-operative pain   . Left hip pain   . Closed nondisplaced intertrochanteric fracture of left femur ([place])   . Loose stools   . Hypoalbuminemia due to protein-calorie malnutrition ([place])   . Hypokalemia   . Acute blood loss anemia   . Seizures ([place])   . Seizure ([place]) 12/15/2019  . Pressure ulcer, stage 2 ([place]) 12/12/2019  . Closed comminuted intertrochanteric fracture of left femur ([place])   . Breakthrough seizure ([place]) 12/08/2019  . Acute encephalopathy 09/11/2019  . Fever 09/11/2019  . Port-A-Cath in place 09/11/2019  . 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 Benign prostatic hyperplasia 03/30/2013    Orientation RESPIRATION BLADDER Height & Weight     Self, Place, Situation  Normal Continent Weight: 127 lb 10.3 oz (57.9 kg) Height:  5\' 7"  (170.2 cm)  BEHAVIORAL SYMPTOMS/MOOD NEUROLOGICAL BOWEL NUTRITION STATUS      Continent Diet (Regular tjhin liquids)  AMBULATORY STATUS COMMUNICATION OF NEEDS Skin   Supervision Verbally Surgical wounds, PU Stage and Appropriate Care   PU Stage 2 Dressing: Daily                    Personal Care Assistance Level of Assistance  Bathing, Dressing Bathing Assistance: Limited assistance   Dressing Assistance: Limited assistance     Functional Limitations Info             SPECIAL CARE FACTORS FREQUENCY  PT (By licensed PT), OT (By licensed OT)     PT Frequency: 5x week OT Frequency: 5x week            Contractures Contractures Info: Not present    Additional Factors Info  Code Status, Allergies Code Status Info: Full Code Allergies Info: NKDA           Current Medications (12/23/2019):  This is the current hospital active medication list Current Facility-Administered Medications  Medication Dose Route Frequency Provider Last Rate Last Admin  . (feeding supplement) PROSource Plus liquid 30 mL  30 mL Oral BID BM Jamse Arn, MD   30 mL at 12/23/19 1451  . aspirin EC tablet 81 mg  81 mg Oral BID WC Jamse Arn, MD   81 mg at 12/23/19 0715  . docusate sodium (COLACE) capsule 100 mg  100 mg Oral BID Cathlyn Parsons, PA-C   100 mg at 12/23/19 0715  . enoxaparin (LOVENOX) injection 40 mg  40 mg Subcutaneous Q24H Donnamae Jude, RPH   40 mg at 12/23/19 0949  . HYDROcodone-acetaminophen (NORCO/VICODIN) 5-325 MG per tablet 1 tablet  1 tablet Oral Q4H PRN Angiulli, Lavon Paganini, PA-C   1 tablet at  12/20/19 0904  . levETIRAcetam (KEPPRA) tablet 1,250 mg  1,250 mg Oral BID Cathlyn Parsons, PA-C   1,250 mg at 12/23/19 0715  . loperamide (IMODIUM) capsule 2 mg  2 mg Oral Q6H PRN Cathlyn Parsons, PA-C   2 mg at 12/16/19 1516  . melatonin tablet 3 mg  3 mg Oral QHS PRN Angiulli, Lavon Paganini, PA-C      . methocarbamol (ROBAXIN) tablet 500 mg  500 mg Oral Q6H PRN Cathlyn Parsons, PA-C   500 mg at 12/18/19 1829   Or  . methocarbamol (ROBAXIN) 500 mg in dextrose 5 % 50 mL IVPB  500 mg Intravenous Q6H PRN Angiulli, Lavon Paganini, PA-C      . Muscle Rub CREA   Topical PRN Lovorn, Megan, MD      . ondansetron (ZOFRAN) tablet 4 mg  4 mg Oral Q6H PRN  Cathlyn Parsons, PA-C   4 mg at 12/19/19 0206   Or  . ondansetron (ZOFRAN) injection 4 mg  4 mg Intravenous Q6H PRN Angiulli, Lavon Paganini, PA-C      . pantoprazole (PROTONIX) EC tablet 40 mg  40 mg Oral Daily Cathlyn Parsons, PA-C   40 mg at 12/23/19 0715  . polycarbophil (FIBERCON) tablet 1,250 mg  1,250 mg Oral Daily Jamse Arn, MD   1,250 mg at 12/23/19 0715  . potassium chloride SA (KLOR-CON) CR tablet 20 mEq  20 mEq Oral Daily Jamse Arn, MD   20 mEq at 12/23/19 0715  . saccharomyces boulardii (FLORASTOR) capsule 250 mg  250 mg Oral BID Jamse Arn, MD   250 mg at 12/23/19 0715  . simethicone (MYLICON) chewable tablet 80 mg  80 mg Oral QID Jamse Arn, MD   80 mg at 12/23/19 1501  . tamsulosin (FLOMAX) capsule 0.4 mg  0.4 mg Oral QPC breakfast Cathlyn Parsons, PA-C   0.4 mg at 12/23/19 7579     Discharge Medications: Please see discharge summary for a list of discharge medications.  Relevant Imaging Results:  Relevant Lab Results:   Additional Information SSN: [id number] Has had both COVID vaccinations. Has bed alarm and chair alarm due to fall risk  Mariam Helbert, Gardiner Rhyme, LCSW

## 2019-12-23 NOTE — Progress Notes (Addendum)
Patient ID: Carlos [name], male   DOB: [birth year], 74 y.o.   MRN: [id number] Spoke with [name]-sister to answer her questions regarding pt's care wants private duty care paid by the hospital due to rick management case prior to discharge home. Sister wants a home evaluation prior to discharge. Interim set up for home health PT & RN. Have ordered rolling walker, 3 in 1 and hospital bed for delivery to home. Recommendation is 24/7 for at least two weeks for his safety and pt requires cues due to cognitive deficit which is pre-morbid. Aware team conference today.  12:45 PM Spoke with pamela via telephone to discuss team conference goals of supervision and almost at goals. She is working with [employer] regarding 24/7 supervision. [name] confirms paying for two weeks of 24/7 supervision and will re-evaluate from there. She will also provide the hospital bed, rolling walker, 3 in 1 and home health therapies.[name] is responsible to find the caregivers emailed her a list of agencys to begin pursuing this. She has many questions regarding if background check done and if vaccinated. Encouraged her to ask these questions when calls. She voiced if not arranged pt may need NHP. Informed her she wold need to contact [name] management regarding this option. Informed also no guarantee of length of time for this either.  2:41 PM pt eels he does not need a hospitall bed, he can use a bunk bed upstairs, if could be moved. Told him to talk with his sister-pamela regarding this. He feels he can go up stairs also, which clearly he can not nor is safe to do. He is not realistic regarding his limitations with his hip fracture.   3:28 PM Spoke with pamela again who reports the private duty agencies have difficulty staffing the weekends and she and her other brother feel he should go to a NH. Informed her to call [name]-back to discuss with her and will need to know it has been approved. Can begin the skilled process, and  encouraged [name] to talk with pt since he does not want to go to a NH. Will continue to work on discharge plan.

## 2019-12-23 NOTE — Plan of Care (Signed)
  Problem: RH Problem Solving Goal: LTG Patient will demonstrate problem solving for (SLP) Description: LTG:  Patient will demonstrate problem solving for basic/complex daily situations with cues  (SLP) Flowsheets (Taken 12/23/2019 1708) LTG: Patient will demonstrate problem solving for (SLP): (mildly complex) -- LTG Patient will demonstrate problem solving for: (downgraded due to baseline function) -- Note: Downgraded due to baseline function

## 2019-12-23 NOTE — Progress Notes (Signed)
Physical Therapy Session Note  Patient Details  Name: Carlos [name] MRN: [id number] Date of Birth: 1945-06-09  Today's Date: 12/23/2019 PT Individual Time: 9371-6967 PT Individual Time Calculation (min): 55 min   Short Term Goals: Week 1:  PT Short Term Goal 1 (Week 1): Pt will perform stand<>pivot transfers with LRAD CGA PT Short Term Goal 1 - Progress (Week 1): Met PT Short Term Goal 2 (Week 1): Pt will ambulate 53f with LRAD CGA PT Short Term Goal 2 - Progress (Week 1): Met PT Short Term Goal 3 (Week 1): Pt will perform simulated car transfer with LRAD and CGA PT Short Term Goal 3 - Progress (Week 1): Met Week 2:  PT Short Term Goal 1 (Week 2): STG=LTG due to LOS  Skilled Therapeutic Interventions/Progress Updates:   Received pt sitting in recliner with RN present administering medications, pt agreeable to therapy, and denied any pain at rest but continues to c/o "stiffness" in L hip with mobility. Pt requesting therapist speak with sister POlin Hauser Therapist spoke with POlin Hauseron the phone and pt's sister requesting therapist perform home visit. Explained that home evaluation would not be possible but encouraged POlin Hauserto send pictures of house due to concerns about spacing and environment. POlin Hauseralso requesting that pt receive a hospital bed and partition to divide living room, as they plan on converting the living room into a bedroom for pt upon D/C. Therapist informed pt that the hospital would most likely not be able to provide partitian but will follow up with CSW on hospital bed request. Therapist also explained recommendation for 24/7 supervision upon D/C due to cognitive deficits, and sister requesting hospital provide the caregiver assistance. CSW and MD notified of all this information. Pt reported urge to use restroom and ambulated 160fwith RW and CGA to bathroom and able to standing and void/ manage clothes with CGA. Pt stood at sink and washed hands with supervision. Pt  ambulated 10042fith RW and CGA to ortho gym with cues to decrease cadence for safety and for upright gaze/posture. Pt ambulated 74f74f uneven surfaces (ramp) with RW and CGA with cues for RW safety. Pt stood with RW and picked up small cup with RW and CGA. Pt performed TUG with RW, close supervision, and mod cues for test technique: Trial 1: 18 seconds Trial 2: 16 seconds Trial 3: 17 seconds  Pt educated on significance of test and test results indicating high fall risk. Therapist educated pt on importance of using RW with mobility for safety and fall prevention; however pt with numerous questions regarding test, ultimately not understanding that he is at a high fall risk. Pt transferred sit<>stand on Airex without AD but BUE support and CGA 2x10 reps. Worked on dynamic standing balance on Airex tossing horseshoes without AD and min A for balance x 3 trials. Pt ambulated 100ft53fh RW and close supervision back to room. Concluded session with pt sitting in recliner on Roho cushion, needs within reach, and seatbelt alarm on.   Therapy Documentation Precautions:  Precautions Precautions: Fall Precaution Comments: seizures Restrictions Weight Bearing Restrictions: No LUE Weight Bearing: Weight bearing as tolerated LLE Weight Bearing: Weight bearing as tolerated   Therapy/Group: Individual Therapy Ileah Falkenstein Alfonse AlpersDPT   12/23/2019, 7:29 AM

## 2019-12-23 NOTE — Patient Care Conference (Signed)
Inpatient RehabilitationTeam Conference and Plan of Care Update Date: 12/23/2019   Time: 11:38 AM    Patient Name: Carlos [name]      Medical Record Number: [id number]  Date of Birth: Jul 16, 1945 Sex: Male         Room/Bed: 4M13C/4M13C-01 Payor Info: Payor: [employer] / Plan: [employer] NAP / Product Type: *No Product type* /    Admit Date/Time:  12/15/2019  3:17 PM  Primary Diagnosis:  Closed nondisplaced intertrochanteric fracture of left femur [facility])  Hospital Problems: Principal Problem:   Closed nondisplaced intertrochanteric fracture of left femur (HCC) Active Problems:   Pressure ulcer, stage 2 ([place])   Seizure ([place])   Loose stools   Hypoalbuminemia due to protein-calorie malnutrition (HCC)   Hypokalemia   Acute blood loss anemia   Seizures (H[facility])   Left hip pain   Abnormal urinalysis   Post-operative pain    Expected Discharge Date: Expected Discharge Date: 12/26/19  Team Members Present: Physician leading conference: Dr. Delice Lesch Care Coodinator Present: Dorien Chihuahua, RN, BSN, CRRN;Becky Dupree, LCSW Nurse Present: Suella Grove, RN PT Present: Becky Sax, PT OT Present: Willeen Cass, OT SLP Present: Charolett Bumpers, SLP PPS Coordinator present : Gunnar Fusi, Novella Olive, PT     Current Status/Progress Goal Weekly Team Focus  Bowel/Bladder   Continent B/B, LBM 12/23/2019  maintain continence  assess and toilet q2h   Swallow/Nutrition/ Hydration             ADL's             Mobility   bed mobility supervision/min A depending on fatigue, transfers CGA, gait >162ft with RW CGA, 4 steps 2 rails min A.  supervision  functional mobility/transfers, generalized strengthening, dynamic standing balance/coordination, ambulation, and endurance   Communication             Safety/Cognition/ Behavioral Observations  Supervision A complex problem solving, attention. Min A recall and safety awareness  Supervision A recall and Mod I  education, complex problem  solving, recall strategies and higher level attention   Pain   denies pain, only stiffness in LLE         Skin   LLE surgical incisin well approximated. Bilat buttocks opened blisters healing well with foam dsg  wound healing  continue wound care as ordered     Discharge Planning:  Family wants hospital to pay for 24/7 caregivers for pt when discharged. pt lives with elderly mother who has caregivers but not 24 hr   Team Discussion:  Chronic issues are stable at present, MD to recheck lab values. Incisions look good however blister has opened on sacral area. Patient demonstrates poor safety awareness and STMD with little recall however did have cognitive deficits prior to admission. Patient was able to manage a regimented day with a routine at work prior to admission and road the [employer] bus to work. Despite patient's ability to manage medications and is more aware of his decreased awareness issues, memory ; he perseverates on issues and can be verbose at times. Therapy staff not recommending the patient return to work at discharge.  Patient is continent of bowel and bladder and had meals prepared for him prior to admission.  Patient on target to meet rehab goals: yes, currently CGA - close supervision and on target to meet goals  *See Care Plan and progress notes for long and short-term goals.   Revisions to Treatment Plan:  Working on complex problem solving, memory, awareness of deficits and medication  management Teaching Needs: Safety awareness; 24 hour care recommended at discharge  Current Barriers to Discharge: Decreased caregiver support, home access and layout  Possible Resolutions to Barriers: Hospital bed requested as patient does not have access to a bed on the main living area.     Medical Summary Current Status: Decreased functional mobility secondary to left intertrochanteric femur fracture. Status post intramedullary fixation 12/11/2019.  Barriers to Discharge:  Hemodialysis;Wound care;Incontinence   Possible Resolutions to [employer] Focus: Therapies, meds for loose stools, follow labs- K+, Hb, optimize pain meds, dressing to buttock wounds   Continued Need for Acute Rehabilitation Level of Care: The patient requires daily medical management by a physician with specialized training in physical medicine and rehabilitation for the following reasons: Direction of a multidisciplinary physical rehabilitation program to maximize functional independence : Yes Medical management of patient stability for increased activity during participation in an intensive rehabilitation regime.: Yes Analysis of laboratory values and/or radiology reports with any subsequent need for medication adjustment and/or medical intervention. : Yes   I attest that I was present, lead the team conference, and concur with the assessment and plan of the team.   Dorien Chihuahua B 12/23/2019, 1:05 PM

## 2019-12-24 ENCOUNTER — Inpatient Hospital Stay (HOSPITAL_COMMUNITY): Payer: 59 | Admitting: Speech Pathology

## 2019-12-24 ENCOUNTER — Telehealth: Payer: Self-pay | Admitting: *Deleted

## 2019-12-24 ENCOUNTER — Inpatient Hospital Stay (HOSPITAL_COMMUNITY): Payer: 59

## 2019-12-24 ENCOUNTER — Inpatient Hospital Stay (HOSPITAL_COMMUNITY): Payer: 59 | Admitting: Occupational Therapy

## 2019-12-24 DIAGNOSIS — S72145D Nondisplaced intertrochanteric fracture of left femur, subsequent encounter for closed fracture with routine healing: Secondary | ICD-10-CM | POA: Diagnosis not present

## 2019-12-24 DIAGNOSIS — R569 Unspecified convulsions: Secondary | ICD-10-CM | POA: Diagnosis not present

## 2019-12-24 DIAGNOSIS — E876 Hypokalemia: Secondary | ICD-10-CM | POA: Diagnosis not present

## 2019-12-24 DIAGNOSIS — D62 Acute posthemorrhagic anemia: Secondary | ICD-10-CM | POA: Diagnosis not present

## 2019-12-24 NOTE — Telephone Encounter (Signed)
I have attempted to call her for the third time with no answer and left messages.

## 2019-12-24 NOTE — Progress Notes (Signed)
Occupational Therapy Session Note  Patient Details  Name: Carlos [name] MRN: [id number] Date of Birth: 1945/05/28  Today's Date: 12/24/2019 OT Individual Time: 1406-1500 OT Individual Time Calculation (min): 54 min    Short Term Goals: Week 1:  OT Short Term Goal 1 (Week 1): Pt will complete LB dressing with min assist OT Short Term Goal 2 (Week 1): Pt will complete bathing with min assist OT Short Term Goal 3 (Week 1): Pt will complete toileting tasks with min assist  Skilled Therapeutic Interventions/Progress Updates:    Pt up in bedside chair to start session.  He voiced the need to go to the bathroom prior to leaving the room.  He was able to ambulate to the toilet with supervision using the RW for support and then against therapist's advice to sit, he stood to urinate with close supervision.  Once complete, he ambulated out to the sink and washed his hands.  Next, had pt ambulate down to the dayroom with supervision and use of the RW.  Min instructional cueing to slow down during ambulation.  Once he reached the dayroom had him focus on standing balance, functional mobility with the RW and short term memory and problem solving. Had him work on tossing bean bags to the [employer] while standing, picking them up from various heights on both the left and right with each UE.  He was then asked to ambulate over to the board to pick them up.  He was also educated on point scoring and questioned on how many points he scored with each interval of tossing as well as keeping a running total.  When picking them up after the first interval, he was able to use the RW and bend down to reach them without use of a reacher.  On the second and third tosses he utilized a Secondary school teacher secondary to reporting increased left hip pain with bending.  He needed overall max instructional cueing to stay squared with the front of the walker as he would try to turn to the side in it when attempting to reach the beanbags.   He also needed mod questioning cueing to recall the total number of points he had accumulated when adding up each interval.  Overall, he required only supervision for tossing them and retrieving them.  Next, had him work on BUE strengthening in sitting.  Attempted to use 4 lb dowel rod for bilateral shoulder flexion.  He was unable to use secondary to being too heavy.  Progressed to use of a light dowel rod of less than a pound.  He needed max demonstrational cueing to complete 1 set of 10 reps as he could not maintain bilateral elbow extension and demonstrated increased posterior lean when attempting to lift the bar. Finished session with return to the room with supervision using the RW for support.  Mod demonstrational cueing given during mobility to keep his head up and shoulders back as he leans forward onto the walker too far.  He was positioned in the recliner at the end of the session with the call button and phone in reach and safety alarm belt in place.      Therapy Documentation Precautions:  Precautions Precautions: Fall Precaution Comments: hx of seizures Restrictions Weight Bearing Restrictions: No LUE Weight Bearing: Weight bearing as tolerated LLE Weight Bearing: Weight bearing as tolerated  Pain: Pain Assessment Pain Scale: 0-10 Pain Score: 5  Pain Type: Surgical pain Pain Location: Hip Pain Orientation: Left Pain Descriptors /  Indicators: Discomfort Pain Onset: With Activity Patients Stated Pain Goal: 0 Pain Intervention(s): Repositioned;Emotional support ADL: See Care Tool Section for some details of mobility and selfcare  Therapy/Group: Individual Therapy  Bronte Sabado OTR/L 12/24/2019, 4:19 PM

## 2019-12-24 NOTE — Discharge Summary (Addendum)
Physician Discharge Summary  Patient ID: [name] MRN: [id number] DOB/AGE: 74/07/1945 74 y.o.  Admit date: 12/15/2019 Discharge date: 12/30/2019  Discharge Diagnoses:  Principal Problem:   Closed nondisplaced intertrochanteric fracture of left femur (HCC) Active Problems:   Pressure ulcer, stage 2 (HCC)   Seizure (HCC)   Loose stools   Hypoalbuminemia due to protein-calorie malnutrition (HCC)   Hypokalemia   Acute blood loss anemia   Seizures (HCC)   Left hip pain   Abnormal urinalysis   Post-operative pain Rectal adenocarcinoma  Discharged Condition: Stable  Significant Diagnostic Studies: DG Chest 1 View  Result Date: 12/09/2019 CLINICAL DATA:  Preop hip fracture EXAM: CHEST  1 VIEW COMPARISON:  09/11/2019 FINDINGS: Right Port-A-Cath remains in place, unchanged. Heart is normal size. Lungs are clear. No effusions. No acute bony abnormality. IMPRESSION: No active disease. Electronically Signed   By: Rolm Baptise M.D.   On: 12/09/2019 01:31   CT Head Wo Contrast  Result Date: 12/08/2019 CLINICAL DATA:  Altered mental status. EXAM: CT HEAD WITHOUT CONTRAST TECHNIQUE: Contiguous axial images were obtained from the base of the skull through the vertex without intravenous contrast. COMPARISON:  September 11, 2019. FINDINGS: Brain: No evidence of acute infarction, hemorrhage, hydrocephalus, extra-axial collection or mass lesion/mass effect. Vascular: No hyperdense vessel or unexpected calcification. Skull: Normal. Negative for fracture or focal lesion. Sinuses/Orbits: No acute finding. Other: None. IMPRESSION: Normal head CT. Electronically Signed   By: Marijo Conception M.D.   On: 12/08/2019 18:27   MR BRAIN WO CONTRAST  Result Date: 12/09/2019 CLINICAL DATA:  Seizure.  Abnormal neuro exam. EXAM: MRI HEAD WITHOUT CONTRAST TECHNIQUE: Multiplanar, multiecho pulse sequences of the brain and surrounding structures were obtained without intravenous contrast. COMPARISON:  CT head  11/07/2019.  MRI head 09/11/2019. FINDINGS: Brain: No acute infarction, hemorrhage, hydrocephalus, extra-axial collection or mass lesion. Minimal T2/FLAIR hyperintensities within the white matter, likely related to chronic microvascular ischemic disease. Mild generalized cerebral atrophy with ex vacuo ventricular dilation. Small remote left cerebellar lacunar infarct with focus of susceptibility in this region likely related to prior hemorrhage. Vascular: Major arterial flow voids are maintained at the skull base. Skull and upper cervical spine: Normal marrow signal. Sinuses/Orbits: Minimal paranasal sinus mucosal thickening. Unremarkable orbits. Other: No mastoid effusions. IMPRESSION: No evidence of acute intracranial abnormality. Electronically Signed   By: Margaretha Sheffield MD   On: 12/09/2019 14:11   Pelvis Portable  Result Date: 12/11/2019 CLINICAL DATA:  Status post ORIF of proximal left femur. EXAM: PORTABLE PELVIS 1-2 VIEWS COMPARISON:  Earlier today FINDINGS: The patient is status post ORIF of comminuted fracture deformity involving the intertrochanteric portions of the proximal left femur. There is been placement of IM rod with compression screw. The hardware components and fracture fragments are in anatomic alignment. IMPRESSION: 1. Status post ORIF of proximal left femur. Electronically Signed   By: Kerby Moors M.D.   On: 12/11/2019 10:23   DG Knee Left Port  Result Date: 12/09/2019 CLINICAL DATA:  Left femur fracture. EXAM: PORTABLE LEFT KNEE - 1-2 VIEW COMPARISON:  None. FINDINGS: No evidence of fracture, dislocation, or joint effusion. Severe narrowing of medial joint space is noted. Soft tissues are unremarkable. 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 No acute abnormality seen in the left knee. Electronically Signed   By: Marijo Conception M.D.   On: 12/09/2019 12:32   EEG adult  Result Date: 12/09/2019 Lora Havens, MD     12/09/2019  1:57 PM  Patient Name:  Carlos [name] MRN: [id number] Epilepsy Attending: Lora Havens Referring Physician/Provider: Reed Pandy Date: 12/09/2019 Duration: 25.42 mins Patient history: 74 year old male with history of epilepsy who presented with breakthrough seizure.  EEG to evaluate with seizures. Level of alertness: Awake, asleep AEDs during EEG study: Keppra Technical aspects: This EEG study was done with scalp electrodes positioned according to the 10-20 International system of electrode placement. Electrical activity was acquired at a sampling rate of 500Hz  and reviewed with a high frequency filter of 70Hz  and a low frequency filter of 1Hz . EEG data were recorded continuously and digitally stored. Description: The posterior dominant rhythm consists of 8 Hz activity of moderate voltage (25-35 uV) seen predominantly in posterior head regions, symmetric and reactive to eye opening and eye closing.Sleep was characterized by vertex waves, sleep spindles (12 to 14 Hz), maximal frontocentral region.   EEG showed independent left frontotemporal (maximal Fp1, F7) and right anterior temporal (maximal F8) spikes.  Hyperventilation and photic stimulation were not performed.  ABNORMALITY -Spikes, independent left and right temporal. IMPRESSION: This study is consistent with patient's known history of independent left and right temporal spikes (maximal Fp1, F7 and F8). No seizures were seen throughout the recording. Lora Havens   DG C-Arm [phone] Min  Result Date: 12/11/2019 CLINICAL DATA:  Left intertrochanteric hip fracture. EXAM: OPERATIVE LEFT HIP (WITH PELVIS IF PERFORMED) 3 VIEWS TECHNIQUE: Fluoroscopic spot image(s) were submitted for interpretation post-operatively. COMPARISON:  12/09/2019 FINDINGS: Intramedullary rod and compression screw fixation of the previously demonstrated left femoral intertrochanteric fracture with anatomic position and alignment. IMPRESSION: Hardware fixation of the previously demonstrated left  femoral intertrochanteric fracture with anatomic position and alignment. Electronically Signed   By: Claudie Revering M.D.   On: 12/11/2019 08:59   DG HIP OPERATIVE UNILAT W OR W/O PELVIS LEFT  Result Date: 12/11/2019 CLINICAL DATA:  Left intertrochanteric hip fracture. EXAM: OPERATIVE LEFT HIP (WITH PELVIS IF PERFORMED) 3 VIEWS TECHNIQUE: Fluoroscopic spot image(s) were submitted for interpretation post-operatively. COMPARISON:  12/09/2019 FINDINGS: Intramedullary rod and compression screw fixation of the previously demonstrated left femoral intertrochanteric fracture with anatomic position and alignment. IMPRESSION: Hardware fixation of the previously demonstrated left femoral intertrochanteric fracture with anatomic position and alignment. Electronically Signed   By: Claudie Revering M.D.   On: 12/11/2019 08:59   DG Hip Unilat W or Wo Pelvis 2-3 Views Left  Result Date: 12/09/2019 CLINICAL DATA:  Fall, left hip pain EXAM: DG HIP (WITH OR WITHOUT PELVIS) 2-3V LEFT COMPARISON:  None. FINDINGS: Single view radiograph pelvis and two view radiograph left hip demonstrates an acute, mildly comminuted intratrochanteric fracture of the left hip with avulsion of the lesser trochanter, mild override of the major fracture fragments, and mild varus angulation. The femoral head is still seated within the left acetabulum. There is mild superimposed bilateral degenerative hip arthritis with joint space narrowing noted. The pelvis and sacrum are intact. Limited evaluation of the right hip is unremarkable. IMPRESSION: Comminuted, angulated left intratrochanteric hip fracture with avulsion of the lesser trochanter. Electronically Signed   By: Fidela Salisbury MD   On: 12/09/2019 00:11   VAS [place] LOWER EXTREMITY VENOUS (DVT)  Result Date: 12/17/2019  Lower Venous DVT Study Indications: Swelling.  Risk Factors: Cancer Rectal adenocarcinoma Surgery 12-11-2019 Intramedullary nail intertrochantric LT hip. Comparison Study: No prior  studies. Performing Technologist: Darlin [name], RDMS  Examination Guidelines: A complete evaluation includes B-mode imaging, spectral Doppler, color Doppler, and power Doppler as needed of all accessible portions  of each vessel. Bilateral testing is considered an integral part of a complete examination. Limited examinations for reoccurring indications may be performed as noted. The reflux portion of the exam is performed with the patient in reverse Trendelenburg.  +---------+---------------+---------+-----------+----------+--------------+ RIGHT    CompressibilityPhasicitySpontaneityPropertiesThrombus Aging +---------+---------------+---------+-----------+----------+--------------+ CFV      Full           Yes      Yes                                 +---------+---------------+---------+-----------+----------+--------------+ SFJ      Full                                                        +---------+---------------+---------+-----------+----------+--------------+ FV Prox  Full                                                        +---------+---------------+---------+-----------+----------+--------------+ FV Mid   Full                                                        +---------+---------------+---------+-----------+----------+--------------+ FV DistalFull                                                        +---------+---------------+---------+-----------+----------+--------------+ PFV      Full                                                        +---------+---------------+---------+-----------+----------+--------------+ POP      Full           Yes      Yes                                 +---------+---------------+---------+-----------+----------+--------------+ PTV      Full                                                        +---------+---------------+---------+-----------+----------+--------------+ PERO     Full                                                         +---------+---------------+---------+-----------+----------+--------------+   +---------+---------------+---------+-----------+----------+--------------+ LEFT  CompressibilityPhasicitySpontaneityPropertiesThrombus Aging +---------+---------------+---------+-----------+----------+--------------+ CFV      Full           Yes      Yes                                 +---------+---------------+---------+-----------+----------+--------------+ SFJ      Full                                                        +---------+---------------+---------+-----------+----------+--------------+ FV Prox  Full                                                        +---------+---------------+---------+-----------+----------+--------------+ FV Mid   Full                                                        +---------+---------------+---------+-----------+----------+--------------+ FV DistalFull                                                        +---------+---------------+---------+-----------+----------+--------------+ PFV      Full                                                        +---------+---------------+---------+-----------+----------+--------------+ POP      Full           Yes      Yes                                 +---------+---------------+---------+-----------+----------+--------------+ PTV      Full                                                        +---------+---------------+---------+-----------+----------+--------------+ PERO     Full                                                        +---------+---------------+---------+-----------+----------+--------------+     Summary: RIGHT: - There is no evidence of deep vein thrombosis in the lower extremity.  - No cystic structure found in the popliteal fossa. - Ultrasound characteristics of enlarged lymph nodes are noted in the groin.  LEFT: - There  is no evidence of deep vein thrombosis  in the lower extremity.  - No cystic structure found in the popliteal fossa. - Ultrasound characteristics of enlarged lymph nodes noted in the groin.  *See table(s) above for measurements and observations. Electronically signed by Harold Barban MD on 12/17/2019 at 7:32:42 AM.    Final     Labs:  Basic Metabolic Panel: Recent Labs  Lab 12/25/19 1140  NA 140  K 4.4  CL 105  CO2 26  GLUCOSE 93  BUN 14  CREATININE 0.89  CALCIUM 9.3    CBC: No results for input(s): WBC, NEUTROABS, HGB, HCT, MCV, PLT in the last 168 hours.  CBG: No results for input(s): GLUCAP in the last 168 hours.  Family history. Mother with dementia and diabetes Father with brain cancer. Denies any esophageal cancer colon cancer or rectal cancer  Brief HPI:   DARRILL VREELAND is a 74 y.o. right-handed male with history of epilepsy maintained on Keppra, rectal adenocarcinoma maintained on Xeloda receiving radiation therapy followed by oncology services Dr. Burr Medico as well as radiation oncology Dr. Lisbeth Renshaw, BPH anxiety as well as history of mild cognitive disorder. Per chart review lives with 56 year old mother. Independent prior to admission working at the [employer]. His mother has 24-hour personal care. Patient reportedly had good support of local family. Presented 12/08/2019 to [facility] long hospital with reported breakthrough seizure after radiation treatment. He was treated with Ativan intramuscularly and loaded with Keppra. Cranial CT scan negative. Admission chemistries potassium 3.0 glucose 136 hemoglobin 12.2 WBC 3.7 ammonia level 182 question false positive repeated 24, lactic acid 1.9, EEG with no seizure. While patient was at [facility] long ED he did sustain another seizure and fell out of the bed without loss of consciousness landing on his left hip sustaining a left intertrochanteric femur fracture. Patient underwent intramedullary fixation 12/11/2019 per Dr. Lyla Glassing. Weightbearing  as tolerated. Maintained on aspirin for CVA prophylaxis. Acute blood loss anemia 8.4 and monitored. Therapy evaluations completed and patient was admitted for a comprehensive rehab program.   Hospital Course: RIGGS DINEEN was admitted to rehab 12/15/2019 for inpatient therapies to consist of PT, ST and OT at least three hours five days a week. Past admission physiatrist, therapy team and rehab RN have worked together to provide customized collaborative inpatient rehab. Pertaining to patient's history of seizure disorder he remained on Keppra no further seizure activity noted and he will continue 1250 mg twice daily. EEG was completed negative for seizure. Patient did sustain left intertrochanteric femur fracture while in the ED after seizure following from the bed as reported sustaining hip fracture undergoing intramedullary fixation 12/11/2019. Neurovascular sensation intact weightbearing as tolerated. Venous Doppler studies lower extremity negative for DVT was on Lovenox for DVT prophylaxis as well as low-dose aspirin. Pain managed with use of Robaxin and hydrocodone as needed. Acute blood loss anemia stable latest hemoglobin 10.2 he did have a history of rectal adenocarcinoma monitoring for any bleeding he was to follow-up outpatient oncology services Dr.Feng. Radiation treatment currently on hold after hip fracture and Xeloda had been stopped. There was noted some red urine urine culture no growth. Patient denied dysuria. Patient was receiving dressing changes to bilateral buttocks ulcer Xeroform dressings as directed. Patient did have a history of BPH maintained on Flomax. He did have episodes of loose stools fiber was ordered question related to his chemotherapy and monitored.   Blood pressures were monitored on TID basis and controlled      Rehab course: During patient's stay in rehab weekly team  conferences were held to monitor patient's progress, set goals and discuss barriers to discharge. At  admission, patient required moderate assist supine to sit moderate assist sit to supine min mod assist sit to stand mod assist 5 feet rolling walker. Moderate assist toilet transfers set up for grooming min mod assist functional ADLs  Physical exam. Blood pressure 97/54 pulse 77 temperature 97.5 respirations 16 oxygen saturations 100% room air Constitutional. No acute distress HEENT Head. Normocephalic and atraumatic Eyes. Pupils round and reactive to light no discharge.nystagmus Neck. Supple nontender no JVD without thyromegaly Cardiac regular rate rhythm without extra sounds or murmur heard Abdomen. Soft nontender positive bowel sounds without rebound Respiratory effort normal no respiratory distress without wheeze Genitourinary. Wearing a condom catheter Musculoskeletal normal range of motion no rigidity Comments. Upper extremities 5/5 bilateral Right lower extremity 5/5 hip flexors knee extension knee flexion dorsi plantar flexion Left lower extremity left hip flexion 2/5 knee extension 2/5 dorsi flexion and plantar flexion 5/5 Skin. Left hip surgical dressing in place he did have bilateral buttock skin tears silver dollar size Neurologic. Alert no acute distress makes eye contact with examiner provides name age date of birth fair medical historian  He/  has had improvement in activity tolerance, balance, postural control as well as ability to compensate for deficits. He/ has had improvement in functional use RUE/LUE  and RLE/LLE as well as improvement in awareness. Patient is able to ambulate into the bathroom with rolling walker contact-guard assist needing ongoing cues for safety awareness. He can ambulate up to 50 feet rolling walker. Patient was stand with rolling walker to change dressing on hip. Stood at toilet to urinate. Contact-guard assist for standing patient ambulates to the [employer] [facility] engaged in therapies. Speech therapy did follow-up for issues in regards to safety awareness  facilitating complex problem-solving skills and simple deductive reasoning task in which patient did require max assist. Patient was able to complete a mildly complex problem solving and organization task with minimal assist.  Family was not able provide the necessary supervision assistance at home skilled nursing facility made available 12/30/2019 discharge taking place       Disposition: Discharged to skilled nursing facility    Diet: Regular  Special Instructions: No driving smoking or alcohol  Weightbearing as tolerated  Wound care. Cleanse wound with normal saline cover with folded Xeroform gauze. Cover with dry gauze 2 x 2's then top with silicone foam position so that point is oriented upward away from anus. Change Xeroform and gauze daily, change sacral foam dressing every 2 to 3 days as needed soiling  Medications at discharge 1`. Aspirin 81 mg twice daily 2. Keppra 1250 mg twice daily 3. Hydrocodone 1 tablet every 4 hours as needed pain 4. Imodium as needed loose stools 5. Robaxin 500 mg every 6 hours as needed muscle spasms 6. Protonix 40 mg p.o. daily 7. FiberCon 1250 mg p.o. daily 8. Florastor 250 mg p.o. twice daily 9. Flomax 0.4 mg daily 10.Melatonin 3 mg QHS as needed  30-35 minutes were spent completing discharge summary and discharge planning  Discharge Instructions     Ambulatory referral to Physical Medicine Rehab   Complete by: As directed    Moderate complexity follow-up 1 month seizure disorder with hip fracture        Contact information for follow-up providers     Jamse Arn, MD Follow up.   Specialty: Physical Medicine and Rehabilitation Why: Office to call for appointment Contact information: [address]  [address] [place] [address] [phone]         Cameron Sprang, MD Follow up.   Specialty: Neurology Why: Call for appointment Contact information: [place] [address] [address] [place] [address] [phone]          Truitt Merle, MD Follow up.   Specialties: Hematology, Oncology Why: Call for appointment Contact information: [place] [address] [phone]         Kyung Rudd, MD Follow up.   Specialty: Radiation Oncology Why: Call for appointment Contact information: [address] [place] [address] [phone]              Contact information for after-discharge care     [facility] .   Service: Skilled Nursing Contact information: [address] [place] [phone]                     Signed: Cathlyn Parsons 12/29/2019, 10:23 AM Patient was seen, face-face, and physical exam performed by me on day of discharge, greater than 30 minutes of total time spent.. Please see progress note from day of discharge as well.  Delice Lesch, MD, ABPMR

## 2019-12-24 NOTE — Progress Notes (Signed)
[place] PHYSICAL MEDICINE & REHABILITATION PROGRESS NOTE  Subjective/Complaints: Patient seen sitting up, working with therapy this morning.  He states he slept well overnight.  Informed yesterday that patient's sister cannot provide support and wishes to pursue SNF placement patient states he is going to go within a 66 mile radius.  Discussed left lower extremity function with patient and therapies.  ROS: Denies CP, SOB, N/V/D  Objective: Vital Signs: Blood pressure (!) 114/56, pulse 71, temperature 97.8 F (36.6 C), temperature source Oral, resp. rate 16, height 5\' 7"  (1.702 m), weight 58.4 kg, SpO2 100 %. No results found. Recent Labs    12/22/19 0915  WBC 4.5  HGB 10.2*  HCT 32.1*  PLT 316   No results for input(s): NA, K, CL, CO2, GLUCOSE, BUN, CREATININE, CALCIUM in the last 72 hours.  Intake/Output Summary (Last 24 hours) at 12/24/2019 1236 Last data filed at 12/24/2019 0730 Gross per 24 hour  Intake 360 ml  Output --  Net 360 ml     Pressure Injury 12/15/19 Buttocks Left Stage 2 -  Partial thickness loss of dermis presenting as a shallow open injury with a red, pink wound bed without slough. ruptured blister first documented as a skin tear measured 4.3 x 4 x 0cm (Active)  12/15/19   Location: Buttocks  Location Orientation: Left  Staging: Stage 2 -  Partial thickness loss of dermis presenting as a shallow open injury with a red, pink wound bed without slough.  Wound Description (Comments): ruptured blister first documented as a skin tear measured 4.3 x 4 x 0cm  Present on Admission: Yes     Pressure Injury 12/15/19 Buttocks Right Stage 2 -  Partial thickness loss of dermis presenting as a shallow open injury with a red, pink wound bed without slough. noted on admission documented as a skin tear measured 4.3 x 6.2 x 0cm (Active)  12/15/19   Location: Buttocks  Location Orientation: Right  Staging: Stage 2 -  Partial thickness loss of dermis presenting as a shallow  open injury with a red, pink wound bed without slough.  Wound Description (Comments): noted on admission documented as a skin tear measured 4.3 x 6.2 x 0cm  Present on Admission: Yes    Physical Exam: BP (!) 114/56 (BP Location: Right Arm)    Pulse 71    Temp 97.8 F (36.6 C) (Oral)    Resp 16    Ht 5\' 7"  (1.702 m)    Wt 58.4 kg    SpO2 100%    BMI 20.16 kg/m  Constitutional: No distress . Vital signs reviewed. HENT: Normocephalic.  Atraumatic. Eyes: EOMI. No discharge. Cardiovascular: No JVD.  RRR. Respiratory: Normal effort.  No stridor.  Bilateral clear to auscultation. GI: Non-distended.  BS +. Skin: Warm and dry.  Left hip with dressing CDI Discoloration bilateral hands Buttock wounds not evaluated today, but healing per nursing Psych: Tangential and perseverative at times. Musc: Left hip with edema and tenderness, improving Generalized muscle wasting noted Neuro: Alert Motor: Left lower extremity: Hip flexion 2/5, knee extension 4/5, ankle dorsiflexion 4/5, stable  Assessment/Plan: 1. Functional deficits which require 3+ hours per day of interdisciplinary therapy in a comprehensive inpatient rehab setting.  Physiatrist is providing close team supervision and 24 hour management of active medical problems listed below.  Physiatrist and rehab team continue to assess barriers to discharge/monitor patient progress toward functional and medical goals   Care Tool:  Bathing    Body parts bathed by  patient: Right arm, Left arm, Chest, Abdomen, Face, Buttocks, Front perineal area, Right upper leg, Left upper leg, Right lower leg, Left lower leg   Body parts bathed by helper: Front perineal area, Buttocks, Right upper leg, Left upper leg, Right lower leg, Left lower leg     Bathing assist Assist Level: Supervision/Verbal cueing     Upper Body Dressing/Undressing Upper body dressing   What is the patient wearing?: Pull over shirt    Upper body assist Assist Level: Set up  assist    Lower Body Dressing/Undressing Lower body dressing      What is the patient wearing?: Pants     Lower body assist Assist for lower body dressing: Minimal Assistance - Patient > 75%     Toileting Toileting    Toileting assist Assist for toileting: Contact Guard/Touching assist     Transfers Chair/bed transfer  Transfers assist     Chair/bed transfer assist level: Supervision/Verbal cueing Chair/bed transfer assistive device: Programmer, multimedia   Ambulation assist      Assist level: Supervision/Verbal cueing Assistive device: Walker-rolling Max distance: >12ft   Walk 10 feet activity   Assist     Assist level: Supervision/Verbal cueing Assistive device: Walker-rolling   Walk 50 feet activity   Assist Walk 50 feet with 2 turns activity did not occur: Safety/medical concerns (LLE pain, generalized weakness, decreased balance/postural control)  Assist level: Supervision/Verbal cueing Assistive device: Walker-rolling    Walk 150 feet activity   Assist Walk 150 feet activity did not occur: Safety/medical concerns (LLE pain, generalized weakness, decreased balance/postural control)  Assist level: Supervision/Verbal cueing Assistive device: Walker-rolling    Walk 10 feet on uneven surface  activity   Assist     Assist level: Contact Guard/Touching assist Assistive device: Aeronautical engineer Will patient use wheelchair at discharge?: No Type of Wheelchair: Manual    Wheelchair assist level: Supervision/Verbal cueing Max wheelchair distance: 143ft    Wheelchair 50 feet with 2 turns activity    Assist        Assist Level: Supervision/Verbal cueing   Wheelchair 150 feet activity     Assist      Assist Level: Supervision/Verbal cueing    Medical Problem List and Plan: 1.  Decreased functional mobility secondary to left intertrochanteric femur fracture. Status post  intramedullary fixation 12/11/2019.  Weightbearing as tolerated  Continue CIR  Attempted to call family, no answer-voicemail box full.  Called again, no answer-left voicemail. 2.  Antithrombotics:             -DVT/anticoagulation: SCDs, Lovenox ordered-monitor for bleeding   Dopplers negative for lower extremity DVT             -antiplatelet therapy: Aspirin 81 BID 3. Pain Management: Robaxin and hydrocodone as needed  Muscle rub prn   Controlled with meds on 10/18  Monitor with increased mobility 4. Mood: Team support             -antipsychotic agents: N/A 5. Neuropsych: This patient is not fully capable of making decisions on his own behalf, suspect baseline dementia. 6. Skin/Wound Care: Routine skin checks  Continue Xeroform to b/l buttock ulcers-healing  Plan to DC staples tomorrow 7. Fluids/Electrolytes/Nutrition: Routine in and outs. 8.  Acute blood loss anemia.    Hemoglobin 10.2 on 11/16  Hemoccult positive on 11/13-see #10 9.  Seizure disorder.    Keppra 1250 mg twice daily.  EEG negative  No breakthrough  seizures from admission to rehab-11/18 10. Rectal adenocarcinoma.  Follow-up oncology service with Dr. Annamaria Boots as well as radiation oncology Dr. Lisbeth Renshaw.  Radiation treatments currently on hold after hip fracture and Xeloda has been stopped for now.  Appreciate heme/onc recs 11.  Hypokalemia  Potassium 4.1 on 11/15, labs ordered for tomorrow  Magnesium within normal limits on 11/15  Daily supplementation started on 11/10, increased on 11/12, decreased on 11/16  Continue to monitor 12.  Severe hypoalbuminemia  Supplement initiated on 11/10 13.  Red urine-?  Resolved  UA + hemoglobin, urine culture NG 14.  GI  Loose stools with "gurgling"/discomfort -?  Secondary to chemo +/- dietary  Fiber ordered, increased on 11/12  Probiotic ordered on 11/12  Encouraged avoiding dairy   Simethicone started on 11/12  Improved  LOS: 9 days A FACE TO FACE EVALUATION WAS  PERFORMED  Lieutenant Abarca Lorie Phenix 12/24/2019, 12:36 PM

## 2019-12-24 NOTE — Telephone Encounter (Signed)
Ms Carlos [name], sister and HCPOA of [name] called and requests a call from Dr Posey Pronto to discuss her brother's impending discharge.  Pleas call her today if possible or first thing tomorrow.  Her number is [phone].

## 2019-12-24 NOTE — Progress Notes (Signed)
Physical Therapy Discharge Summary  Patient Details  Name: Carlos [name] MRN: [id number] Date of Birth: [birth year]  Patient has met 8 of 8 long term goals due to improved activity tolerance, improved balance, improved postural control, increased strength, decreased pain, improved awareness and improved coordination. Patient to discharge at an ambulatory level Supervision. Pt's sister Carlos [name] present for brief family education session on 11/22 and observed pt ambulate and perform stair navigation but did not participate with any hands on practice. Therapist also spoke with Carlos [name] on two different occasions regarding pt's current mobility level and recommendation for need for 24/7 supervision upon discharge. Pt and pt's sister verbalized understanding.   All goals met   Recommendation:  Patient will benefit from ongoing skilled PT services in skilled nursing facility setting to continue to advance safe functional mobility, address ongoing impairments in transfers, generalized strengthening, dynamic standing balance/coordination, ambulation, endurance, stair navigation, safety awareness, and to minimize fall risk.  Equipment: RW  Reasons for discharge: treatment goals met  Patient/family agrees with progress made and goals achieved: Yes  PT Discharge Precautions/Restrictions Precautions Precautions: Fall Precaution Comments: hx of seizures Restrictions Weight Bearing Restrictions: No LLE Weight Bearing: Weight bearing as tolerated Cognition Overall Cognitive Status: History of cognitive impairments - at baseline Arousal/Alertness: Awake/alert Orientation Level: Oriented X4 Memory: Impaired Awareness: Appears intact Problem Solving: Impaired Safety/Judgment: Impaired Comments: pt requires cues for safety awareness and problem solving Sensation Sensation Light Touch: Appears Intact Proprioception: Appears Intact Coordination Gross Motor Movements are Fluid and Coordinated:  No Fine Motor Movements are Fluid and Coordinated: No Coordination and Movement Description: mild uncoordination due to pain and stiffness in LLE, generalized weakness, and poor endurance Finger Nose Finger Test: mild dysmetria bilaterally Heel Shin Test: Decreased ROM on LLE, WFL on RLE Motor  Motor Motor: Abnormal postural alignment and control Motor - Skilled Clinical Observations: grossly uncoordinated due to pain, stiffness, generalized weakness, and decresaed activity tolerance  Mobility Bed Mobility Bed Mobility: Rolling Right;Rolling Left;Supine to Sit;Sit to Supine Rolling Right: Independent with assistive device Rolling Left: Independent with assistive device Supine to Sit: Independent with assistive device Sit to Supine: Independent with assistive device Transfers Transfers: Sit to Stand;Stand to Sit;Stand Pivot Transfers Sit to Stand: Supervision/Verbal cueing Stand to Sit: Supervision/Verbal cueing Stand Pivot Transfers: Supervision/Verbal cueing Stand Pivot Transfer Details: Verbal cues for safe use of DME/AE;Verbal cues for technique Stand Pivot Transfer Details (indicate cue type and reason): cues for RW safety and turning technique Transfer (Assistive device): Rolling walker Locomotion  Gait Ambulation: Yes Gait Assistance: Supervision/Verbal cueing Gait Distance (Feet): 150 Feet Assistive device: Rolling walker Gait Assistance Details: Verbal cues for technique;Verbal cues for gait pattern;Verbal cues for safe use of DME/AE Gait Assistance Details: verbal cues for upright posture/gaze, L knee flexion, and RW safety Gait Gait: Yes Gait Pattern: Impaired Gait Pattern: Step-through pattern;Decreased weight shift to left;Left circumduction;Decreased stride length;Left hip hike;Antalgic;Poor foot clearance - left;Poor foot clearance - right;Trunk flexed;Decreased trunk rotation Gait velocity: decreased Stairs / Additional Locomotion Stairs: Yes Stairs Assistance:  Contact Guard/Touching assist Stair Management Technique: Two rails Number of Stairs: 12 Height of Stairs: 6 Ramp: Contact Guard/touching assist (RW) Wheelchair Mobility Wheelchair Mobility: Yes Wheelchair Assistance: Chartered loss adjuster: Both upper extremities Wheelchair Parts Management: Needs assistance Distance: 145f  Trunk/Postural Assessment  Cervical Assessment Cervical Assessment: Within Functional Limits Thoracic Assessment Thoracic Assessment: Exceptions to WFL (kyphosis) Lumbar Assessment Lumbar Assessment: [facility] to [facility]) Postural Control Postural Control: Deficits on evaluation  Balance  Balance Balance Assessed: Yes Static Sitting Balance Static Sitting - Balance Support: Feet supported;No upper extremity supported Static Sitting - Level of Assistance: 7: Independent Dynamic Sitting Balance Dynamic Sitting - Balance Support: Feet supported;No upper extremity supported Dynamic Sitting - Level of Assistance: 7: Independent Static Standing Balance Static Standing - Balance Support: Bilateral upper extremity supported (RW) Static Standing - Level of Assistance: 5: Stand by assistance (supervision) Dynamic Standing Balance Dynamic Standing - Balance Support: Bilateral upper extremity supported (RW) Dynamic Standing - Level of Assistance: 5: Stand by assistance (supervision) Extremity Assessment  RLE Assessment RLE Assessment: Exceptions to [facility] General Strength Comments: grossly generalized to 4/5 (except hip flexion 4-/5) LLE Assessment LLE Assessment: Exceptions to [facility] LLE Strength Left Hip Flexion: 2+/5 Left Hip ABduction: 4-/5 Left Hip ADduction: 3+/5 Left Knee Flexion: 4/5 Left Knee Extension: 3+/5 Left Ankle Dorsiflexion: 4/5 Left Ankle Plantar Flexion: 4/5   Ryla Cauthon M Lyn Hollingshead PT, DPT  12/24/2019, 7:37 AM

## 2019-12-24 NOTE — Progress Notes (Addendum)
Patient ID: Carlos [name], male   DOB: 10-30-1945, 74 y.o.   MRN: [id number] Fl2 completed and will fax unsure if will receive offer due to fall risk and risk managment case. Will talk with [name] regarding coverage for this. Faxed out with in 50 miles. See if any offers. Pt agreeable to going to a NH for a short time  9:21 PM Spoke with [name]-sister to discuss once again the decision regarding going to NH now due to staffing issues with private duty agencies. Message left for [name] and awaiting return call. Asking questions regarding recovery time it takes and places they recommend. Informed again need to call ortho MD and gave her name and office number. Asking for partition at  home to cord off room. Informed to call [name] regarding this  1;52 PM [name] called asking for Dr Serita Grit office number have given this to her to follow up with.  3:14 PM Awaiting decision by family of plan, concerns regarding getting caregivers over the weekend. MD has agreed to Monday discharge if helps. Looking for SNF bed no offers as of yet

## 2019-12-24 NOTE — Progress Notes (Signed)
Speech Language Pathology Weekly Progress and Session Note  Patient Details  Name: JSIAH MENTA MRN: [id number] Date of Birth: May 17, 1945  Beginning of progress report period: December 17, 2019 End of progress report period: December 24, 2019  Today's Date: 12/24/2019 SLP Individual Time: 1100-1130 SLP Individual Time Calculation (min): 30 min  Short Term Goals: Week 1: SLP Short Term Goal 1 (Week 1): Pt will demonstrate selective attention to functional tasks for 10 minutes in moderately distracting environment with supervision verbal cues for redirection. SLP Short Term Goal 1 - Progress (Week 1): Met SLP Short Term Goal 2 (Week 1): Pt will recall rehab techniques or 2 events from previous therapy session with mod A using external aids. SLP Short Term Goal 2 - Progress (Week 1): Met SLP Short Term Goal 3 (Week 1): Pt will complete functional mildly complex problem solving tasks with mod A verbal cues SLP Short Term Goal 3 - Progress (Week 1): Met    New Short Term Goals: Week 2: SLP Short Term Goal 1 (Week 2): STG's=LTG's SLP Short Term Goal 1 - Progress (Week 2):  (ELOS=11/20)  Weekly Progress Updates:  Patient met 3/3 STG's for cognitive-linguistic function but continues to benefit from supervision and minA for mildly complex problem solving skills. Discharge plan has changed from home with 24 hour supervision to SNF due to inability of patient to secure enough HH assistance due to staffing shortages. Patient will benefit from SLP evaluation and treatment at next venue of care as his future goal is return to work.(works at [employer]; Hydrographic surveyor).   Intensity: Minumum of 1-2 x/day, 30 to 90 minutes Frequency: 3 to 5 out of 7 days Duration/Length of Stay: 12/26/19 Treatment/Interventions:     Daily Session  Skilled Therapeutic Interventions: Patient seen to address cognitive goals during skilled ST session. He was very verbose and would repeat himself, cycling  through same topics with only minimal acknowledgement that he had done so. (He told SLP at least 7 times about fall from stretcher in ER, and each time said it mainly the same way.). Patient disorganized with papers folded up and on table.He required mod cues to transition during conversation. He is aware of his restrictions such as risk of seizure, but this appears only intellectually, as he feels he could return to work. At this time, patient's thought pattern is disorganized, repetitious without adequate self-monitoring. He continues to benefit from SLP intervention to maximize cognitive-linguistic functioning prior to discharge.    General    Pain Pain Assessment Pain Scale: 0-10 Pain Score: 3  Pain Type: Acute pain Pain Location: Leg Pain Orientation: Left Pain Descriptors / Indicators: Aching;Cramping Pain Onset: Gradual Patients Stated Pain Goal: 0 Pain Intervention(s): Other (Comment) (RN already aware and had applied muscle rub)  Therapy/Group: Individual Therapy   Sonia Baller, MA, CCC-SLP Speech Therapy

## 2019-12-24 NOTE — Progress Notes (Signed)
Physical Therapy Session Note  Patient Details  Name: GRAYDON FOFANA MRN: [id number] Date of Birth: Dec 04, 1945  Today's Date: 12/24/2019 PT Individual Time: 4665-9935 PT Individual Time Calculation (min): 54 min   Short Term Goals: Week 1:  PT Short Term Goal 1 (Week 1): Pt will perform stand<>pivot transfers with LRAD CGA PT Short Term Goal 1 - Progress (Week 1): Met PT Short Term Goal 2 (Week 1): Pt will ambulate 90f with LRAD CGA PT Short Term Goal 2 - Progress (Week 1): Met PT Short Term Goal 3 (Week 1): Pt will perform simulated car transfer with LRAD and CGA PT Short Term Goal 3 - Progress (Week 1): Met Week 2:  PT Short Term Goal 1 (Week 2): STG=LTG due to LOS  Skilled Therapeutic Interventions/Progress Updates:   Received pt supine in bed with RN present administering medications, pt agreeable to therapy, and denied any pain but continues to c/o L hip "stiffness" and soreness with mobility. Repositioning and rest breaks done to reduce pain levels. Session with emphasis on functional mobility/transfers, generalized strengthening, dynamic standing balance/coordination, ambulation, stair navigation, and improved activity tolerance. Pt transferred supine<>sitting EOB Mod I with increased time from flat bed and transferred stand<>pivot bed<>WC without AD and CGA. Pt performed WC mobility 1550fusing BUE and supervision to therapy gym. Pt navigated 12 steps with 2 rails and CGA ascending and descending with a step to pattern with cues for technique. [facility] and ambulated 18045f 3 trials with RW and close supervision with cues for upright posture and gaze and to decrease cadence for safety as well as cues for RW safety when turning to sit. MD present for morning rounds to assess LLE strength. Pt informed therapist that his family is currently trying to find a facility for him to stay at upon discharge; updated CSW with this information. Pt performed BLE  strengthening on Kinetron at 20cm/sec for 1 minute x 4 trials with supervision without back support with emphasis on quad/glute strengthening and trunk control with cues to keep trunk stable and for anterior weight shfiting. Pt with continued short term memory deficits asking therapist multiple times if soaking in the tub will help with his recovery. Therapist explained to pt that soaking in a tub may help with pain/soreness but would not contribute to muscle strength. Therapist encouraged pt to avoid getting into the tub right now due to balance deficits and increased risk of falling getting in/out of the tub; pt verbalized understanding. [facility] and ambulated 28f7fth RW and supervision to recliner. Concluded session with pt sitting in recliner on Roho cushion for skin integrity, needs within reach, and seatbelt alarm on.   Therapy Documentation Precautions:  Precautions Precautions: Fall Precaution Comments: hx of seizures Restrictions Weight Bearing Restrictions: No LUE Weight Bearing: Weight bearing as tolerated LLE Weight Bearing: Weight bearing as tolerated   Therapy/Group: Individual Therapy AnnaAlfonse Alpers DPT   12/24/2019, 7:42 AM

## 2019-12-24 NOTE — Progress Notes (Signed)
Physical Therapy Session Note  Patient Details  Name: Carlos [name] MRN: [id number] Date of Birth: [birth year]  Today's Date: 12/24/2019 PT Individual Time: 8882-8003 PT Individual Time Calculation (min): 60 min   Short Term Goals: Week 1:  PT Short Term Goal 1 (Week 1): Pt will perform stand<>pivot transfers with LRAD CGA PT Short Term Goal 1 - Progress (Week 1): Met PT Short Term Goal 2 (Week 1): Pt will ambulate 88f with LRAD CGA PT Short Term Goal 2 - Progress (Week 1): Met PT Short Term Goal 3 (Week 1): Pt will perform simulated car transfer with LRAD and CGA PT Short Term Goal 3 - Progress (Week 1): Met Week 2:  PT Short Term Goal 1 (Week 2): STG=LTG due to LOS  Skilled Therapeutic Interventions/Progress Updates:    PAIN 3/10 post hip pain  Pt initially oob in recliner requesting pain meds and use of BR.  Nursing notified and provided pain meds during session. Sit to stand w/cga and cues for safety, somewhat impulsive.  Gait 130fto commode, stood at commode/continent of urine, gait 1059fo sink, stood to wash hands, then transferred to armchair w/cga and cues for safety due to impulsive nature.  stand pivot transfer to wc w/cga and cues.  Transported to hallway. Sit to stand and gait x 210f105frollator, cga, cues for upright posture.  Mild decreased knee flexion L w/swing but pt gait pattern deteriorates signficantly and cadence slows when he attempts to focus on knee flexion   standing in parallel bars: Using mirror for feedback, visual and tactile cues to isolate movement - Hamstring curls 2 x15 Hip abd 2x12 w/cues for erect trunk/isolation of abd Hip extension 2x20 Hip flexion 2 x12  Pt requires seated rest x 2-3 min between exercises for recovery due to fatigue Gait 15ft56frecliner/turn/sit to recliner w/cga. At end of session, pt left oob in recliner w/chair alarm set and needs in reach.    Therapy Documentation Precautions:  Precautions Precautions:  Fall Precaution Comments: hx of seizures Restrictions Weight Bearing Restrictions: No LUE Weight Bearing: Weight bearing as tolerated LLE Weight Bearing: Weight bearing as tolerated   Therapy/Group: Individual Therapy  BarbaCallie Fielding  Rosalia8/2021, 12:42 PM

## 2019-12-24 NOTE — Discharge Instructions (Signed)
Inpatient Rehab Discharge Instructions  JOHNATHAN HESKETT Discharge date and time: No discharge date for patient encounter.   Activities/Precautions/ Functional Status: Activity: activity as tolerated Diet: regular diet Wound Care: Routine skin checks Functional status:  ___ No restrictions     ___ Walk up steps independently ___ 24/7 supervision/assistance   ___ Walk up steps with assistance ___ Intermittent supervision/assistance  ___ Bathe/dress independently ___ Walk with walker     _x__ Bathe/dress with assistance ___ Walk Independently    ___ Shower independently ___ Walk with assistance    ___ Shower with assistance ___ No alcohol     ___ Return to work/school ________  Special Instructions: No driving smoking or alcohol  Wound care cleanse wound with normal saline cover with folded Xeroform gauze. Cover with dry gauze 2 x 2's and top with silicone foam position so that point is oriented upward away from anus. Change Xeroform and gauze daily change sacral foam dressing every 2 to 3 days as needed soiling    COMMUNITY REFERRALS UPON DISCHARGE:    Home Health:   [facility] [address]                                                 Agency/Supplier:[facility]  [phone]   My questions have been answered and I understand these instructions. I will adhere to these goals and the provided educational materials after my discharge from the hospital.  Patient/Caregiver Signature _______________________________ Date __________  Clinician Signature _______________________________________ Date __________  Please bring this form and your medication list with you to all your follow-up doctor's appointments.

## 2019-12-25 ENCOUNTER — Inpatient Hospital Stay (HOSPITAL_COMMUNITY): Payer: 59

## 2019-12-25 ENCOUNTER — Inpatient Hospital Stay (HOSPITAL_COMMUNITY): Payer: 59 | Admitting: Speech Pathology

## 2019-12-25 ENCOUNTER — Inpatient Hospital Stay (HOSPITAL_COMMUNITY): Payer: 59 | Admitting: Occupational Therapy

## 2019-12-25 DIAGNOSIS — E876 Hypokalemia: Secondary | ICD-10-CM | POA: Diagnosis not present

## 2019-12-25 DIAGNOSIS — R569 Unspecified convulsions: Secondary | ICD-10-CM | POA: Diagnosis not present

## 2019-12-25 DIAGNOSIS — S72145D Nondisplaced intertrochanteric fracture of left femur, subsequent encounter for closed fracture with routine healing: Secondary | ICD-10-CM | POA: Diagnosis not present

## 2019-12-25 DIAGNOSIS — G8918 Other acute postprocedural pain: Secondary | ICD-10-CM | POA: Diagnosis not present

## 2019-12-25 DIAGNOSIS — C20 Malignant neoplasm of rectum: Secondary | ICD-10-CM

## 2019-12-25 LAB — BASIC METABOLIC PANEL
Anion gap: 9 (ref 5–15)
BUN: 14 mg/dL (ref 8–23)
CO2: 26 mmol/L (ref 22–32)
Calcium: 9.3 mg/dL (ref 8.9–10.3)
Chloride: 105 mmol/L (ref 98–111)
Creatinine, Ser: 0.89 mg/dL (ref 0.61–1.24)
GFR, Estimated: >60 mL/min (ref >60)
Glucose, Bld: 93 mg/dL (ref 70–99)
Potassium: 4.4 mmol/L (ref 3.5–5.1)
Sodium: 140 mmol/L (ref 135–145)

## 2019-12-25 NOTE — Progress Notes (Addendum)
Patient ID: Carlos [name], male   DOB: Jan 31, 1946, 74 y.o.   MRN: [id number] One bed offer and havre contacted [name] to discuss and see if actual bed offer. She will get back with worker. Will let [name] know once confirmed. None of the facilties they want [facility] and [facility] have declined pt. Will need to push discharge until Monday to get a plan in place, MD and team aware of plan.  9:29 AM Spoke with [name] to inform possible bed offer at [facility] will confirm hopefully by 12:00 today. Sister feels he will be there longer than two weeks, aware [facility] has committed to 2 weeks of coverage. She voiced they have no place at home to have his own place. Unless he can go up 14 stairs to his room. Will ask PT to see if could go up 14 steps aware he is working on steps. Made aware would need assist to go up and down and not be able to do on his own. Aware plan to discharge Monday if plan can be worked out. Dr Posey Pronto was in the room and handed him the phone to speak with sister and answer her questions.  11:00 Am received second bed offer from [facility] in [place] have left message with [facility] to confirm offer.  11:30 AM Spoke with [facility] he has pulled bed offer due to circumstances surrounding fall, being in the hospital.

## 2019-12-25 NOTE — Progress Notes (Addendum)
Patient ID: Carlos [name], male   DOB: [birth year], 74 y.o.   MRN: [id number]  Still awaiting word from [facility] regarding confirmed offer. She is checking with administration and cost of his care there for the next two weeks. Have informed MD, PA and therapy team regarding may be here past Monday, to get worked out. [name] will let me know when answer. [name] sister is aware also.

## 2019-12-25 NOTE — Progress Notes (Signed)
Occupational Therapy Weekly Progress Note  Patient Details  Name: Carlos [name] MRN: [id number] Date of Birth: 08/28/1945  Beginning of progress report period: December 16, 2019 End of progress report period: December 25, 2019  Today's Date: 12/25/2019 OT Individual Time: 2637-8588 OT Individual Time Calculation (min): 70 min    Patient has met 3 of 3 short term goals.  Pt is making steady progress towards goals.  Pt is currently able to complete bathing and dressing tasks at overall supervision level.  Pt completes ambulatory transfers with RW with supervision, cues for safe positioning and use of RW.  Pt requires cues for problem solving, sequencing, and safety due to impaired cognition requiring frequent direction due to verbosity, tangential speech, and perseveration on various topics.  Pt would benefit from 24/7 supervision due to cognitive impairments impacting safety and independence with self-care tasks and functional mobility.  Patient continues to demonstrate the following deficits: muscle weakness, decreased cardiorespiratoy endurance and pain, decreased attention, decreased awareness, decreased safety awareness and decreased memory and decreased standing balance, decreased postural control and decreased balance strategies and therefore will continue to benefit from skilled OT intervention to enhance overall performance with BADL and Reduce care partner burden.  Patient progressing toward long term goals..  Continue plan of care.  OT Short Term Goals Week 1:  OT Short Term Goal 1 (Week 1): Pt will complete LB dressing with min assist OT Short Term Goal 1 - Progress (Week 1): Met OT Short Term Goal 2 (Week 1): Pt will complete bathing with min assist OT Short Term Goal 2 - Progress (Week 1): Met OT Short Term Goal 3 (Week 1): Pt will complete toileting tasks with min assist OT Short Term Goal 3 - Progress (Week 1): Met Week 2:  OT Short Term Goal 1 (Week 2): STG = LTGs due to  remaining LOS  Skilled Therapeutic Interventions/Progress Updates:    Treatment session with focus on self-care retraining, functional transfers, dynamic standing balance, and recall of recommendations to increase safety and independence with self-care tasks.  Pt received upright in recliner, perseverating on "working out" his leg.  Pt agreeable to bathing at shower level at this session. Pt ambulated to room shower with RW with supervision, cues for appropriate placement and positioning of RW during mobility.  Pt completed bathing at sit > stand level with distant supervision in room shower with seat.  Pt completed dressing from EOB with setup assist for UB dressing and mod cues to complete LB dressing at sit > stand level due to decreased safety, balance and sequencing when standing in single leg stance.  Pt with tendency to push RW out to side during ambulation, therapist reinforcing recommendation to maintain RW placement directly in front of pt with BUE on RW when ambulating.  Discussed tub/shower set up at home [facility] with pictures of home setup).  Pt ambulated to ADL tubroom with RW with supervision.  Pt attempted to step over tub ledge, but unable to advance LLE over tub ledge.  Therapist educated on use of tub transfer bench to sit and pivot on bench.  Pt able to complete with min cues and demonstration.  Pt returned to room with RW supervision and returned to recliner.  Pt remained upright in recliner with seat belt alarm on and all needs in reach.  Therapy Documentation Precautions:  Precautions Precautions: Fall Precaution Comments: hx of seizures Restrictions Weight Bearing Restrictions: No LUE Weight Bearing: Weight bearing as tolerated LLE Weight Bearing: Weight bearing  as tolerated Pain:  Pt with no c/o pain   Therapy/Group: Individual Therapy  Simonne Come 12/25/2019, 10:29 AM

## 2019-12-25 NOTE — Progress Notes (Signed)
Physical Therapy Session Note  Patient Details  Name: Carlos [name] MRN: [id number] Date of Birth: [birth year]  Today's Date: 12/25/2019 PT Individual Time: 0800-0856 PT Individual Time Calculation (min): 56 min   Short Term Goals: Week 2:  PT Short Term Goal 1 (Week 2): STG=LTG due to LOS  Skilled Therapeutic Interventions/Progress Updates:    Patient received supine in bed, agreeable to PT. He denies pain this AM, but endorses "stiffness." Patient asking multiple times re: "stiffness" in L hip. PT re-educating patient on pathophysiology regarding hip fx and sx as well as age-associated stiffness in joints. He was able to ambulate from his room to ortho gym ~51ft with RW and close supervision. He complete L toe taps onto 6" box with B UE support on RW. Initially, patient vaulting on R LE and hip hike on L LE to achieve full ROM. With verbal and tactile cues to further engage L hip flexors and minimize/eliminate other compensatory strategies, patient able to successfully achieve full ROM onto 6" box. Patient completing the following therex without back support for further core engagement: 3# dowel chest press 3x12, no added weight shoulder press 2x10 (patient compensating for decreased strength with posterior trunk lean), 2# LAQ 3x12, 2# marches 3x12. Patient with noted significantly decreased activation of L hip flexors. Patient completing lateral stepping with B UE support on hallway handrail 66ftx4, retro stepping with U UE support on handrail 35ft x4. Patient ambulating back into room with RW and close supervision, returning to recliner with ROHO cushion, seatbelt alarm on, call light within reach.   Therapy Documentation Precautions:  Precautions Precautions: Fall Precaution Comments: hx of seizures Restrictions Weight Bearing Restrictions: No LUE Weight Bearing: Weight bearing as tolerated LLE Weight Bearing: Weight bearing as tolerated    Therapy/Group: Individual  Therapy  Karoline Caldwell, PT, DPT, CBIS  12/25/2019, 7:32 AM

## 2019-12-25 NOTE — Progress Notes (Signed)
[facility] PHYSICAL MEDICINE & REHABILITATION PROGRESS NOTE  Subjective/Complaints: Patient seen sitting up in his chair this morning.  He states he slept well overnight.  He states he is willing to go to a skilled nursing facility within a 75 mile radius of [place].  Discussed discontinuing staples.  Attempted to call sister again yesterday, no answer, left voicemail.  Discussed disposition with social worker who was on the phone with sister.  Answered his sister's questions regarding recovery, outpatient follow-up, therapies, as well as sisters concern for previous events in hospitalization.  ROS: Denies CP, SOB, N/V/D  Objective: Vital Signs: Blood pressure 132/60, pulse 82, temperature 98.2 F (36.8 C), temperature source Oral, resp. rate 19, height 5\' 7"  (1.702 m), weight 58.4 kg, SpO2 100 %. No results found. No results for input(s): WBC, HGB, HCT, PLT in the last 72 hours. No results for input(s): NA, K, CL, CO2, GLUCOSE, BUN, CREATININE, CALCIUM in the last 72 hours.  Intake/Output Summary (Last 24 hours) at 12/25/2019 1105 Last data filed at 12/25/2019 0740 Gross per 24 hour  Intake 592 ml  Output --  Net 592 ml     Pressure Injury 12/15/19 Buttocks Left Stage 2 -  Partial thickness loss of dermis presenting as a shallow open injury with a red, pink wound bed without slough. ruptured blister first documented as a skin tear measured 4.3 x 4 x 0cm (Active)  12/15/19   Location: Buttocks  Location Orientation: Left  Staging: Stage 2 -  Partial thickness loss of dermis presenting as a shallow open injury with a red, pink wound bed without slough.  Wound Description (Comments): ruptured blister first documented as a skin tear measured 4.3 x 4 x 0cm  Present on Admission: Yes     Pressure Injury 12/15/19 Buttocks Right Stage 2 -  Partial thickness loss of dermis presenting as a shallow open injury with a red, pink wound bed without slough. noted on admission documented as a skin  tear measured 4.3 x 6.2 x 0cm (Active)  12/15/19   Location: Buttocks  Location Orientation: Right  Staging: Stage 2 -  Partial thickness loss of dermis presenting as a shallow open injury with a red, pink wound bed without slough.  Wound Description (Comments): noted on admission documented as a skin tear measured 4.3 x 6.2 x 0cm  Present on Admission: Yes    Physical Exam: BP 132/60 (BP Location: Right Arm)   Pulse 82   Temp 98.2 F (36.8 C) (Oral)   Resp 19   Ht 5\' 7"  (1.702 m)   Wt 58.4 kg   SpO2 100%   BMI 20.16 kg/m  Constitutional: No distress . Vital signs reviewed. HENT: Normocephalic.  Atraumatic. Eyes: EOMI. No discharge. Cardiovascular: No JVD.  RRR. Respiratory: Normal effort.  No stridor.  Bilateral clear to auscultation. GI: Non-distended.  BS +. Skin: Warm and dry.  Left hip with staples CDI Discoloration of bilateral hands Buttock wounds not examined today. Psych: Somewhat slowed and perseverative and tangential. Musc: Left hip with improving edema and tenderness Generalized muscle wasting noted. Neuro: Alert Motor: Left lower extremity: Hip flexion 2/5, knee extension 4/5, ankle dorsiflexion 4/5, unchanged  Assessment/Plan: 1. Functional deficits which require 3+ hours per day of interdisciplinary therapy in a comprehensive inpatient rehab setting.  Physiatrist is providing close team supervision and 24 hour management of active medical problems listed below.  Physiatrist and rehab team continue to assess barriers to discharge/monitor patient progress toward functional and medical goals  Care Tool:  Bathing    Body parts bathed by patient: Right arm, Left arm, Chest, Abdomen, Face, Buttocks, Front perineal area, Right upper leg, Left upper leg, Right lower leg, Left lower leg   Body parts bathed by helper: Front perineal area, Buttocks, Right upper leg, Left upper leg, Right lower leg, Left lower leg     Bathing assist Assist Level:  Supervision/Verbal cueing     Upper Body Dressing/Undressing Upper body dressing   What is the patient wearing?: Pull over shirt    Upper body assist Assist Level: Set up assist    Lower Body Dressing/Undressing Lower body dressing      What is the patient wearing?: Pants     Lower body assist Assist for lower body dressing: Minimal Assistance - Patient > 75%     Toileting Toileting    Toileting assist Assist for toileting: Supervision/Verbal cueing     Transfers Chair/bed transfer  Transfers assist     Chair/bed transfer assist level: Supervision/Verbal cueing Chair/bed transfer assistive device: Programmer, multimedia   Ambulation assist      Assist level: Supervision/Verbal cueing Assistive device: Walker-rolling Max distance: 50   Walk 10 feet activity   Assist     Assist level: Supervision/Verbal cueing Assistive device: Walker-rolling   Walk 50 feet activity   Assist Walk 50 feet with 2 turns activity did not occur: Safety/medical concerns (LLE pain, generalized weakness, decreased balance/postural control)  Assist level: Supervision/Verbal cueing Assistive device: Walker-rolling    Walk 150 feet activity   Assist Walk 150 feet activity did not occur: Safety/medical concerns (LLE pain, generalized weakness, decreased balance/postural control)  Assist level: Contact Guard/Touching assist Assistive device: Walker-rolling    Walk 10 feet on uneven surface  activity   Assist     Assist level: Contact Guard/Touching assist Assistive device: Aeronautical engineer Will patient use wheelchair at discharge?: No Type of Wheelchair: Manual    Wheelchair assist level: Supervision/Verbal cueing Max wheelchair distance: 117ft    Wheelchair 50 feet with 2 turns activity    Assist        Assist Level: Supervision/Verbal cueing   Wheelchair 150 feet activity     Assist      Assist Level:  Supervision/Verbal cueing    Medical Problem List and Plan: 1.  Decreased functional mobility secondary to left intertrochanteric femur fracture. Status post intramedullary fixation 12/11/2019.  Weightbearing as tolerated  Continue CIR, ultimately plan for SNF on Monday  Attempted to call sister x3, left voicemails.  Ultimately discussed with sister and answered all questions on 11/19. 2.  Antithrombotics:             -DVT/anticoagulation: SCDs, Lovenox ordered-monitor for bleeding, no signs of present   Dopplers negative for lower extremity DVT             -antiplatelet therapy: Aspirin 81 BID 3. Pain Management: Robaxin and hydrocodone as needed  Muscle rub prn   Controlled with meds on 11/19  Monitor with increased mobility 4. Mood: Team support             -antipsychotic agents: N/A 5. Neuropsych: This patient is not fully capable of making decisions on his own behalf, suspect baseline dementia. 6. Skin/Wound Care: Routine skin checks  Continue Xeroform to b/l buttock ulcers-healing  DC staples 7. Fluids/Electrolytes/Nutrition: Routine in and outs. 8.  Acute blood loss anemia.    Hemoglobin 10.2 on 11/16  Hemoccult  positive on 11/13-see #10 9.  Seizure disorder.    Keppra 1250 mg twice daily.  EEG negative  No breakthrough seizures from admission to rehab-11/19 10.  Rectal adenocarcinoma.  Follow-up oncology service with Dr. Annamaria Boots as well as radiation oncology Dr. Lisbeth Renshaw.  Radiation treatments currently on hold after hip fracture and Xeloda has been stopped for now. 11.  Hypokalemia  Potassium 4.1 on 11/15, labs pending  Magnesium within normal limits on 11/15  Daily supplementation started on 11/10, increased on 11/12, decreased on 11/16  Continue to monitor 12.  Severe hypoalbuminemia  Supplement initiated on 11/10 13.  Red urine-?  Resolved  UA + hemoglobin, urine culture NG 14.  GI-baseline  Loose stools with "gurgling"/discomfort -?  Secondary to chemo +/- dietary  Fiber  ordered, increased on 11/12  Probiotic ordered on 11/12  Encouraged avoiding dairy   Simethicone started on 11/12  Improved  LOS: 10 days A FACE TO FACE EVALUATION WAS PERFORMED  Shawntrice Salle Lorie Phenix 12/25/2019, 11:05 AM

## 2019-12-25 NOTE — Progress Notes (Signed)
Speech Language Pathology Daily Session Note  Patient Details  Name: Carlos [name] MRN: [id number] Date of Birth: 04/03/1945  Today's Date: 12/25/2019 SLP Individual Time: 1300-1400 SLP Individual Time Calculation (min): 60 min  Short Term Goals: Week 2: SLP Short Term Goal 1 (Week 2): STG's=LTG's SLP Short Term Goal 1 - Progress (Week 2):  (ELOS=11/20)  Skilled Therapeutic Interventions:   Patient seen for skilled ST session focusing on cognitive function. Patient did not recognize SLP from therapy session yesterday. He continues to be very perseverative and repetitive, with focus on a few topics (his short term memory, staples being removed, SNF placement plans, tightness of legs). His short term memory is in moderate impairment range on Cognistat, and although he performed well with number sequence repetition, his attention appears to be impacted as he becomes hyper focused on a select number of topics and is not able to transition fully. Patient continues to benefit from skilled SLP intervention to maximize cognitive-linguistic function prior to discharge.  Pain Pain Assessment Pain Scale: 0-10 Pain Score: 0-No pain  Therapy/Group: Individual Therapy  Sonia Baller, MA, CCC-SLP Speech Therapy

## 2019-12-25 NOTE — Progress Notes (Signed)
Per order, staples removed to Left hip. 18 staples in all. Pt tolerated well. Skin is clean and dry and intact. Dry gauze paced and secured with medipore tape.

## 2019-12-26 ENCOUNTER — Inpatient Hospital Stay (HOSPITAL_COMMUNITY): Payer: 59

## 2019-12-26 ENCOUNTER — Inpatient Hospital Stay (HOSPITAL_COMMUNITY): Payer: 59 | Admitting: Occupational Therapy

## 2019-12-26 DIAGNOSIS — S72145S Nondisplaced intertrochanteric fracture of left femur, sequela: Secondary | ICD-10-CM | POA: Diagnosis not present

## 2019-12-26 DIAGNOSIS — Z741 Need for assistance with personal care: Secondary | ICD-10-CM

## 2019-12-26 DIAGNOSIS — Z7409 Other reduced mobility: Secondary | ICD-10-CM

## 2019-12-26 NOTE — Progress Notes (Addendum)
[facility] PHYSICAL MEDICINE & REHABILITATION PROGRESS NOTE  Subjective/Complaints:  Pt with questions regarding D/C plan  C/o freq soft stools  ROS: Denies CP, SOB, N/V/D  Objective: Vital Signs: Blood pressure 122/72, pulse 83, temperature 97.8 F (36.6 C), temperature source Oral, resp. rate 16, height 5\' 7"  (1.702 m), weight 56.8 kg, SpO2 100 %. No results found. No results for input(s): WBC, HGB, HCT, PLT in the last 72 hours. Recent Labs    12/25/19 1140  NA 140  K 4.4  CL 105  CO2 26  GLUCOSE 93  BUN 14  CREATININE 0.89  CALCIUM 9.3    Intake/Output Summary (Last 24 hours) at 12/26/2019 0844 Last data filed at 12/26/2019 3976 Gross per 24 hour  Intake 415 ml  Output --  Net 415 ml     Pressure Injury 12/15/19 Buttocks Left Stage 2 -  Partial thickness loss of dermis presenting as a shallow open injury with a red, pink wound bed without slough. ruptured blister first documented as a skin tear measured 4.3 x 4 x 0cm (Active)  12/15/19   Location: Buttocks  Location Orientation: Left  Staging: Stage 2 -  Partial thickness loss of dermis presenting as a shallow open injury with a red, pink wound bed without slough.  Wound Description (Comments): ruptured blister first documented as a skin tear measured 4.3 x 4 x 0cm  Present on Admission: Yes     Pressure Injury 12/15/19 Buttocks Right Stage 2 -  Partial thickness loss of dermis presenting as a shallow open injury with a red, pink wound bed without slough. noted on admission documented as a skin tear measured 4.3 x 6.2 x 0cm (Active)  12/15/19   Location: Buttocks  Location Orientation: Right  Staging: Stage 2 -  Partial thickness loss of dermis presenting as a shallow open injury with a red, pink wound bed without slough.  Wound Description (Comments): noted on admission documented as a skin tear measured 4.3 x 6.2 x 0cm  Present on Admission: Yes    Physical Exam: BP 122/72 (BP Location: Right Arm)   Pulse  83   Temp 97.8 F (36.6 C) (Oral)   Resp 16   Ht 5\' 7"  (1.702 m)   Wt 56.8 kg   SpO2 100%   BMI 19.61 kg/m   Musc: Left hip with improving edema and tenderness Generalized muscle wasting noted. Neuro: Alert Motor: Left lower extremity: Hip flexion 2/5, knee extension 4/5, ankle dorsiflexion 4/5, unchanged  General: No acute distress Mood and affect are appropriate Heart: Regular rate and rhythm no rubs murmurs or extra sounds Lungs: Clear to auscultation, breathing unlabored, no rales or wheezes Abdomen: Positive bowel sounds, soft nontender to palpation, nondistended Extremities: No clubbing, cyanosis, or edema Skin: No evidence of breakdown, no evidence of rash  Assessment/Plan: 1. Functional deficits which require 3+ hours per day of interdisciplinary therapy in a comprehensive inpatient rehab setting.  Physiatrist is providing close team supervision and 24 hour management of active medical problems listed below.  Physiatrist and rehab team continue to assess barriers to discharge/monitor patient progress toward functional and medical goals   Care Tool:  Bathing    Body parts bathed by patient: Right arm, Left arm, Chest, Abdomen, Face, Buttocks, Front perineal area, Right upper leg, Left upper leg, Right lower leg, Left lower leg   Body parts bathed by helper: Front perineal area, Buttocks, Right upper leg, Left upper leg, Right lower leg, Left lower leg  Bathing assist Assist Level: Supervision/Verbal cueing     Upper Body Dressing/Undressing Upper body dressing   What is the patient wearing?: Pull over shirt    Upper body assist Assist Level: Set up assist    Lower Body Dressing/Undressing Lower body dressing      What is the patient wearing?: Pants, Underwear/pull up     Lower body assist Assist for lower body dressing: Supervision/Verbal cueing     Toileting Toileting    Toileting assist Assist for toileting: Supervision/Verbal cueing      Transfers Chair/bed transfer  Transfers assist     Chair/bed transfer assist level: Supervision/Verbal cueing Chair/bed transfer assistive device: Programmer, multimedia   Ambulation assist      Assist level: Supervision/Verbal cueing Assistive device: Walker-rolling Max distance: 50   Walk 10 feet activity   Assist     Assist level: Supervision/Verbal cueing Assistive device: Walker-rolling   Walk 50 feet activity   Assist Walk 50 feet with 2 turns activity did not occur: Safety/medical concerns (LLE pain, generalized weakness, decreased balance/postural control)  Assist level: Supervision/Verbal cueing Assistive device: Walker-rolling    Walk 150 feet activity   Assist Walk 150 feet activity did not occur: Safety/medical concerns (LLE pain, generalized weakness, decreased balance/postural control)  Assist level: Contact Guard/Touching assist Assistive device: Walker-rolling    Walk 10 feet on uneven surface  activity   Assist     Assist level: Contact Guard/Touching assist Assistive device: Aeronautical engineer Will patient use wheelchair at discharge?: No Type of Wheelchair: Manual    Wheelchair assist level: Supervision/Verbal cueing Max wheelchair distance: 177ft    Wheelchair 50 feet with 2 turns activity    Assist        Assist Level: Supervision/Verbal cueing   Wheelchair 150 feet activity     Assist      Assist Level: Supervision/Verbal cueing    Medical Problem List and Plan: 1.  Decreased functional mobility secondary to left intertrochanteric femur fracture. Status post intramedullary fixation 12/11/2019.  Weightbearing as tolerated  Continue CIR, ultimately plan for SNF on Monday  DIscussed plan for SNF as well as need to perform exercise program in bed 2.  Antithrombotics:             -DVT/anticoagulation: SCDs, Lovenox ordered-monitor for bleeding, no signs of  present   Dopplers negative for lower extremity DVT             -antiplatelet therapy: Aspirin 81 BID 3. Pain Management: Robaxin and hydrocodone as needed  Muscle rub prn   Controlled with meds on 11/19  Monitor with increased mobility 4. Mood: Team support             -antipsychotic agents: N/A 5. Neuropsych: This patient is not fully capable of making decisions on his own behalf, suspect baseline dementia. 6. Skin/Wound Care: Routine skin checks  Continue Xeroform to b/l buttock ulcers-healing  DC staples 7. Fluids/Electrolytes/Nutrition: Routine in and outs. 8.  Acute blood loss anemia.    Hemoglobin 10.2 on 11/16  Hemoccult positive on 11/13-see #10 9.  Seizure disorder.    Keppra 1250 mg twice daily.  EEG negative  No breakthrough seizures from admission to rehab-11/19 10.  Rectal adenocarcinoma.  Follow-up oncology service with Dr. Annamaria Boots as well as radiation oncology Dr. Lisbeth Renshaw.  Radiation treatments currently on hold after hip fracture and Xeloda has been stopped for now. 11.  Hypokalemia  Potassium 4.1 on  11/15, labs pending  Magnesium within normal limits on 11/15  Daily supplementation started on 11/10, increased on 11/12, decreased on 11/16  Continue to monitor 12.  Severe hypoalbuminemia  Supplement initiated on 11/10 13.  Red urine-?  Resolved  UA + hemoglobin, urine culture NG 14.  GI-baseline  Loose stools with "gurgling"/discomfort -?  Secondary to chemo +/- dietary  Fiber ordered, increased on 11/12  Probiotic ordered on 11/12  Encouraged avoiding dairy   Simethicone started on 11/12 D/c colace 11/20  LOS: 11 days A FACE TO [place] E Kayo [place] 12/26/2019, 8:44 AM

## 2019-12-27 ENCOUNTER — Inpatient Hospital Stay (HOSPITAL_COMMUNITY): Payer: 59

## 2019-12-27 NOTE — Progress Notes (Signed)
Physical Therapy Session Note  Patient Details  Name: Carlos [name] MRN: [id number] Date of Birth: 1946-01-06  Today's Date: 12/27/2019 PT Individual Time: 5284-1324 PT Individual Time Calculation (min): 30 min   Short Term Goals: Week 2:  PT Short Term Goal 1 (Week 2): STG=LTG due to LOS  Skilled Therapeutic Interventions/Progress Updates:     Patient in bed with his mother and brother in the room upon PT arrival. Patient alert and agreeable to PT session. Patient denied pain during session.  Patient and his brother informed this PT that they have decided for the patient to go to a SNF at d/c rather than home due to 24/7 supervision. Patient verbose and tangential throughout discussion and difficult to follow reasoning. Will need to follow-up with further discussion with the family, CSW and lead therapists informed via secure chat.   Therapeutic Activity: Bed Mobility: Patient performed supine to/from sit with supervision-mod I and increased time for management of his L leg, uses his pants to assist moving his L leg throughout. Transfers: Patient performed sit to/from stand x1 with supervision. Provided verbal cues for controlled descent.  Gait Training:  Patient ambulated 20 feet using RW with supervision. Ambulated with antalgic gait with decreased hip and knee flexion on L, decreased stance time on L, and close proximity to RW. Provided verbal cues for increased hip and knee flexion in swing for improved L foot clearance, increased L weight shift, and maintaining safe proximity to RW.  Patient perseverating on joint stiffness of L lower extremity and his L shoulder. Patient with limited AROM of L shoulder flexion, abduction, and ER. Improved with PROM stretching, see below.   Therapeutic Exercise: Patient performed the following exercises with verbal and tactile cues for proper technique. -PROM with prolonged stretch x30 sec x3 at end range  -shoulder flexion with manual assist for  scapular ROM with progressive ROM from ~120 deg to ~150 deg  -shoulder abduction with manual assist for scapular ROM with progressive ROM from ~90 deg to 120 sed  -shoulder ER in scapular plane with minimal ROM past neutral -L hip/knee flexion in supine x10 with 10 sec hold for stretch -L hip IR/ER x10 to tolerance with very limited ROM  Patient in bed at end of session with breaks locked, bed alarm set, and all needs within reach.    Therapy Documentation Precautions:  Precautions Precautions: Fall Precaution Comments: hx of seizures Restrictions Weight Bearing Restrictions: No LUE Weight Bearing: Weight bearing as tolerated LLE Weight Bearing: Weight bearing as tolerated General: PT Amount of Missed Time (min): 15 Minutes PT Missed Treatment Reason: Other (Comment) (therapist late due to assisting other patient)   Therapy/Group: Individual Therapy  Lucyann Romano L Derrico Zhong PT, DPT  12/27/2019, 5:10 PM

## 2019-12-27 NOTE — Progress Notes (Signed)
Speech Language Pathology Daily Session Note  Patient Details  Name: Carlos [name] MRN: [id number] Date of Birth: 12-Jan-1946  Today's Date: 12/27/2019 SLP Individual Time: 0601-5615 SLP Individual Time Calculation (min): 67 min  Short Term Goals: Week 2: SLP Short Term Goal 1 (Week 2): STG's=LTG's SLP Short Term Goal 1 - Progress (Week 2):  (ELOS=11/20)  Skilled Therapeutic Interventions:Skilled ST services focused on education and cognitive skills. SLP facilitated recall, selective attention, problem solving and error awareness skills in mildly complex inferncing and organizational tasks x2 and x1 complex problem solving scheduling task. Pt required mod A fade to min A verbal cues for use of organization strategies to reduce errors, increase problem solving and aid sustained/selective attention skills. Pt demonstrated greater ability, from most recent session with writer in carryover of organizational strategies when given direct instruction and similar tasks. Pt's mother and brother entered room. Family supports cognitive changes from baseline abilities with noted improvement since hosptialization. After obtaining pt's permission SLP educated pt's family pertaining to current deficits, treatment goals and need for 24 hour supervision. All questions answered to satisfaction. Pt was left in room with family, call bell within reach and bed alarm set. SLP recommends to continue skilled services.     Pain Pain Assessment Pain Scale: 0-10 Pain Score: 0-No pain  Therapy/Group: Individual Therapy  Pharrell Ledford  [facility] 12/27/2019, 2:56 PM

## 2019-12-28 ENCOUNTER — Inpatient Hospital Stay (HOSPITAL_COMMUNITY): Payer: 59 | Admitting: Occupational Therapy

## 2019-12-28 ENCOUNTER — Inpatient Hospital Stay (HOSPITAL_COMMUNITY): Payer: 59

## 2019-12-28 DIAGNOSIS — S72145S Nondisplaced intertrochanteric fracture of left femur, sequela: Secondary | ICD-10-CM | POA: Diagnosis not present

## 2019-12-28 NOTE — Progress Notes (Signed)
Occupational Therapy Session Note  Patient Details  Name: Carlos [name] MRN: [id number] Date of Birth: 1946/01/01  Today's Date: 12/28/2019 OT Individual Time: 8127-5170 OT Individual Time Calculation (min): 29 min    Short Term Goals: Week 2:  OT Short Term Goal 1 (Week 2): STG = LTGs due to remaining LOS  Skilled Therapeutic Interventions/Progress Updates:    Pt seen for OT treatment.  He was able to complete transfer to the EOB.  He ambulated to the toilet with min assist demonstrating one LOB to the left while using the RW.  He completed clothing management and hygiene with supervision as well.  He requested to stand to urinate, which he was able to complete with close supervision.  He then ambulated out to the sink with supervision but needed min instructional cueing to avoid the wheelchair leg rest on the left of the sink.  Next, he ambulated to the therapy gym with supervision using the RW for support and transitioned to supine at the same level.  Had him use a 1 lb dowel rod for bilateral shoulder flexion with max demonstrational cueing for completion.  Pt with increased shoulder weakness making it difficult to complete shoulder flexion while maintaining elbow extension.  Approximately 0-120 degrees ROM available.  He completed 10 reps of bilateral shoulder flexion with min to mod assist as well as unilateral shoulder flexion at the same level.  Pt with increased shoulder hike noted with each attempt.  He continues to demonstrate decreased cognition, and continued to ask the same questions over and over regarding when he will be completely better, and when asking questions regarding his shoulder strength.  He completed ambulation back to the room with supervision and was left in the recliner with the call button and phone in reach and safety belt in place.    Therapy Documentation Precautions:  Precautions Precautions: Fall Precaution Comments: hx of seizures Restrictions Weight  Bearing Restrictions: No LUE Weight Bearing: Weight bearing as tolerated LLE Weight Bearing: Weight bearing as tolerated General:   Vital Signs:   Pain: Pain Assessment Pain Scale: 0-10 Pain Score: 2  ADL: ADL Upper Body Bathing: Supervision/safety, Setup Where Assessed-Upper Body Bathing: Edge of bed Lower Body Bathing: Maximal assistance Where Assessed-Lower Body Bathing: Edge of bed Upper Body Dressing: Setup Where Assessed-Upper Body Dressing: Edge of bed Lower Body Dressing: Maximal assistance Where Assessed-Lower Body Dressing: Edge of bed Toileting: Moderate assistance Where Assessed-Toileting: Glass blower/designer: Psychiatric nurse Method: Counselling psychologist: Raised toilet seat Vision   Perception    Praxis   Exercises:   Other Treatments:     Therapy/Group: Individual Therapy  Brinden Kincheloe 12/28/2019, 10:00 AM

## 2019-12-28 NOTE — Progress Notes (Signed)
Physical Therapy Session Note  Patient Details  Name: Carlos [name] MRN: [id number] Date of Birth: Nov 08, 1945  Today's Date: 12/28/2019 PT Individual Time: 9794-8016 and 5537-4827 PT Individual Time Calculation (min): 25 min and 57 min  Short Term Goals: Week 1:  PT Short Term Goal 1 (Week 1): Pt will perform stand<>pivot transfers with LRAD CGA PT Short Term Goal 1 - Progress (Week 1): Met PT Short Term Goal 2 (Week 1): Pt will ambulate 69f with LRAD CGA PT Short Term Goal 2 - Progress (Week 1): Met PT Short Term Goal 3 (Week 1): Pt will perform simulated car transfer with LRAD and CGA PT Short Term Goal 3 - Progress (Week 1): Met Week 2:  PT Short Term Goal 1 (Week 2): STG=LTG due to LOS  Skilled Therapeutic Interventions/Progress Updates:   Treatment Session 1: [id number] min Received pt sitting in recliner, pt agreeable to therapy, and denied any pain during session but continues to report "stiffness" in L hip. Session with emphasis on functional mobility/transfers, generalized strengthening, dynamic standing balance/coordination, ambulation, stair navigation, and improved activity tolerance. Pt transferred sit<>stand with RW and supervision and ambulated [id number] 2 trials to/from therapy gym with RW and supervision. Pt required cues for upright posture/gaze. Pt navigated 12 steps with CGA/min A for balance. Pt navigated first and last 4 steps using lateral stepping technique and 1 rail and CGA and middle 4 steps ascending and descending forward (per pt request) with a step to and step through pattern with 1 rail and min A. Pt with difficulty lifting LLE resulting in pt pulling on pant leg to lift LE. Pt with 1 LOB when descending anteriorly requiring mod A to correct and educated pt on reasoning for recommending lateral stepping on stairs as it provides pt with more stability as he is able to hold onto rail with 2 hands instead of 1. Pt reported urge to have BM and performed toilet  transfer with supervision and able to manage clothes with supervision. However, pt ultimately unsuccessful. Pt asking therapist recommendations to assist with having BM. Therapist notified RN and RN ordered prune juice for pt. Concluded session with pt sitting in recliner, needs within reach, and seatbelt alarm on.   Treatment Session 2: [id number] 21in Received pt sitting in recliner with sister, Carlos Hauserpresent at bedside, pt agreeable to therapy, and denied any pain during session but continues to report "stiffness" in L hip. Session with emphasis on family education, functional mobility/transfers, generalized strengthening, dynamic standing balance/coordination, ambulation, stair navigation, and improved activity tolerance. Pt transferred sit<>stand with RW and ambulated [id number] 1 and [id number] with RW and close supervision to therapy gym with cues for upright posture and gaze. Pt navigated 12 steps with 1 rail using a lateral stepping technique with cues for technique. Educated pt's sister Carlos HauserbodyEconomiststioning, and technique and need for supervision overall and CGA for stair navigation; pt's sister verbalized understanding. Worked on dynamic standing balance re-creating pictures on pegboard x 2 trials without AD and CGA with emphasis on visual/spatial relationship and perception. Pt required min cues during trial 1 and mod/max cues  And increased time during trial 2 due to complexity. Pt ambulated >[id number] RW and close supervision back to room and reported urge to have BM. Pt able to manage clothing with CGA. Pt with small loose BM and able to void. Pt able to perform peri-care with supervision. Pt transferred sit<>supine with supervision. Concluded session with pt supine  in bed, needs within reach, and bed alarm on.   Therapy Documentation Precautions:  Precautions Precautions: Fall Precaution Comments: hx of seizures Restrictions Weight Bearing Restrictions: No LUE Weight  Bearing: Weight bearing as tolerated LLE Weight Bearing: Weight bearing as tolerated   Therapy/Group: Individual Therapy Alfonse Alpers PT, DPT   12/28/2019, 7:25 AM

## 2019-12-28 NOTE — Progress Notes (Signed)
PHYSICAL MEDICINE & REHABILITATION PROGRESS NOTE  Subjective/Complaints: He asks about SNF bed. Reviewed SW note with him from Friday. As per today's note we should have an answer today from [facility], which he most prefers.   Vitals stable  ROS: Denies CP, SOB, N/V/D  Objective: Vital Signs: Blood pressure 125/69, pulse 74, temperature 98.4 F (36.9 C), temperature source Oral, resp. rate 19, height 5\' 7"  (1.702 m), weight 56.3 kg, SpO2 100 %. No results found. No results for input(s): WBC, HGB, HCT, PLT in the last 72 hours. Recent Labs    12/25/19 1140  NA 140  K 4.4  CL 105  CO2 26  GLUCOSE 93  BUN 14  CREATININE 0.89  CALCIUM 9.3    Intake/Output Summary (Last 24 hours) at 12/28/2019 0843 Last data filed at 12/28/2019 0505 Gross per 24 hour  Intake 594 ml  Output --  Net 594 ml     Pressure Injury 12/15/19 Buttocks Left Stage 2 -  Partial thickness loss of dermis presenting as a shallow open injury with a red, pink wound bed without slough. ruptured blister first documented as a skin tear measured 4.3 x 4 x 0cm (Active)  12/15/19   Location: Buttocks  Location Orientation: Left  Staging: Stage 2 -  Partial thickness loss of dermis presenting as a shallow open injury with a red, pink wound bed without slough.  Wound Description (Comments): ruptured blister first documented as a skin tear measured 4.3 x 4 x 0cm  Present on Admission: Yes     Pressure Injury 12/15/19 Buttocks Right Stage 2 -  Partial thickness loss of dermis presenting as a shallow open injury with a red, pink wound bed without slough. noted on admission documented as a skin tear measured 4.3 x 6.2 x 0cm (Active)  12/15/19   Location: Buttocks  Location Orientation: Right  Staging: Stage 2 -  Partial thickness loss of dermis presenting as a shallow open injury with a red, pink wound bed without slough.  Wound Description (Comments): noted on admission documented as a skin tear measured  4.3 x 6.2 x 0cm  Present on Admission: Yes    Physical Exam: BP 125/69 (BP Location: Right Arm)   Pulse 74   Temp 98.4 F (36.9 C) (Oral)   Resp 19   Ht 5\' 7"  (1.702 m)   Wt 56.3 kg   SpO2 100%   BMI 19.44 kg/m  Gen: no distress, normal appearing HEENT: oral mucosa pink and moist, NCAT Cardio: Reg rate Chest: normal effort, normal rate of breathing Abd: soft, non-distended Ext: no edema Skin: intact Neuro: Alert Motor: Left lower extremity: Hip flexion 2/5, knee extension 4/5, ankle dorsiflexion 4/5, unchanged Left hip with improving edema and tenderness Generalized muscle wasting noted Psych: pleasant, normal affect  Assessment/Plan: 1. Functional deficits which require 3+ hours per day of interdisciplinary therapy in a comprehensive inpatient rehab setting.  Physiatrist is providing close team supervision and 24 hour management of active medical problems listed below.  Physiatrist and rehab team continue to assess barriers to discharge/monitor patient progress toward functional and medical goals   Care Tool:  Bathing    Body parts bathed by patient: Right arm, Left arm, Chest, Abdomen, Face, Buttocks, Front perineal area, Right upper leg, Left upper leg, Right lower leg, Left lower leg   Body parts bathed by helper: Front perineal area, Buttocks, Right upper leg, Left upper leg, Right lower leg, Left lower leg     Bathing assist  Assist Level: Supervision/Verbal cueing     Upper Body Dressing/Undressing Upper body dressing   What is the patient wearing?: Pull over shirt    Upper body assist Assist Level: Set up assist    Lower Body Dressing/Undressing Lower body dressing      What is the patient wearing?: Pants, Underwear/pull up     Lower body assist Assist for lower body dressing: Supervision/Verbal cueing     Toileting Toileting    Toileting assist Assist for toileting: Supervision/Verbal cueing     Transfers Chair/bed transfer  Transfers  assist     Chair/bed transfer assist level: Supervision/Verbal cueing Chair/bed transfer assistive device: Programmer, multimedia   Ambulation assist      Assist level: Supervision/Verbal cueing Assistive device: Walker-rolling Max distance: 50   Walk 10 feet activity   Assist     Assist level: Supervision/Verbal cueing Assistive device: Walker-rolling   Walk 50 feet activity   Assist Walk 50 feet with 2 turns activity did not occur: Safety/medical concerns (LLE pain, generalized weakness, decreased balance/postural control)  Assist level: Supervision/Verbal cueing Assistive device: Walker-rolling    Walk 150 feet activity   Assist Walk 150 feet activity did not occur: Safety/medical concerns (LLE pain, generalized weakness, decreased balance/postural control)  Assist level: Contact Guard/Touching assist Assistive device: Walker-rolling    Walk 10 feet on uneven surface  activity   Assist     Assist level: Contact Guard/Touching assist Assistive device: Aeronautical engineer Will patient use wheelchair at discharge?: No Type of Wheelchair: Manual    Wheelchair assist level: Supervision/Verbal cueing Max wheelchair distance: 136ft    Wheelchair 50 feet with 2 turns activity    Assist        Assist Level: Supervision/Verbal cueing   Wheelchair 150 feet activity     Assist      Assist Level: Supervision/Verbal cueing    Medical Problem List and Plan: 1.  Decreased functional mobility secondary to left intertrochanteric femur fracture. Status post intramedullary fixation 12/11/2019.  Weightbearing as tolerated  Continue CIR, ultimately plan for SNF on Monday  DIscussed plan for SNF as well as need to perform exercise program in bed 2.  Antithrombotics:             -DVT/anticoagulation: SCDs, Lovenox ordered-monitor for bleeding, no signs at present   Dopplers negative for lower extremity DVT              -antiplatelet therapy: Aspirin 81 BID 3. Pain Management: Robaxin and hydrocodone as needed  Muscle rub prn   Controlled with meds 11/22  Monitor with increased mobility 4. Mood: Team support             -antipsychotic agents: N/A 5. Neuropsych: This patient is not fully capable of making decisions on his own behalf, suspect baseline dementia. 6. Skin/Wound Care: Routine skin checks  Continue Xeroform to b/l buttock ulcers-healing  DC staples 7. Fluids/Electrolytes/Nutrition: Routine in and outs. 8.  Acute blood loss anemia.    Hemoglobin 10.2 on 11/16  Hemoccult positive on 11/13-see #10 9.  Seizure disorder.    Keppra 1250 mg twice daily.  EEG negative  No breakthrough seizures from admission to rehab-11/19 10.  Rectal adenocarcinoma.  Follow-up oncology service with Dr. Annamaria Boots as well as radiation oncology Dr. Lisbeth Renshaw.  Radiation treatments currently on hold after hip fracture and Xeloda has been stopped for now. 11.  Hypokalemia  Potassium 4.1 on 11/15, labs pending  Magnesium within normal limits on 11/15  Daily supplementation started on 11/10, increased on 11/12, decreased on 11/16  Continue to monitor 12.  Severe hypoalbuminemia  Supplement initiated on 11/10 13.  Red urine-?  Resolved  UA + hemoglobin, urine culture NG 14.  GI-baseline  Loose stools with "gurgling"/discomfort -?  Secondary to chemo +/- dietary  Fiber ordered, increased on 11/12  Probiotic ordered on 11/12  Encouraged avoiding dairy   Simethicone started on 11/12 D/c colace 11/20 15. Disposition: awaiting decision from [facility] 11/22  LOS: 13 days A FACE TO FACE EVALUATION WAS [place] P Kysha Muralles 12/28/2019, 8:43 AM

## 2019-12-28 NOTE — Progress Notes (Addendum)
Patient ID: Whitney Post, male   DOB: [birth year], 74 y.o.   MRN: [id number]  Spoke with [name] this am who gave worker more NH to look into, [facility] declined him, [facility] of [facility] has no beds and [employer] WS message left. Spoke with [employer] who will have an answer by today whether will take and also spoke with [facility] who offered but will need to check with management regarding confirmation of offer. Asked [name] what the plan will be if no bed offered she did not have one. Wanted hospital bed cancelled. She wants therapist's to work on flight of stairs, made aware again he would need assistance to go up and down the stairs and still need 24/7 supervision. Await responses from [facility] and [facility].  12:40 PM Called [name] and [facility] to hear if had an answer. Both need to get back with this worker. No answer yet  3:43 PM Met with pamela when here to discuss with pt and encouraged her to stay for PT session to see what he is doing and how much assist he requires. Made aware still no word from either facility[facility] or [facility]. [facility] has no beds. Updated team. Hopefully decision will be made soon.

## 2019-12-28 NOTE — Progress Notes (Signed)
Occupational Therapy Session Note  Patient Details  Name: Carlos [name] MRN: [id number] Date of Birth: 1946/01/08  Today's Date: 12/28/2019 OT Individual Time: 1000-1040 and 1300-1344 OT Individual Time Calculation (min): 40 min and 44 min  Skilled Therapeutic Interventions/Progress Updates:    Pt greeted in the recliner, requesting to shower. He completed bathing (sit<stand from TTB), dressing (sit<stand from elevated toilet using RW), toileting (sit<stand from elevated toilet, +bladder void), and handwashing (standing at the sink) during session. All functional transfers completed at ambulatory level using RW with supervision assist. Vcs required throughout session for safety including donning nonslip footwear prior to mobility, sitting vs standing at appropriate times during LB self care (I.e. donning pants), and management/placement of RW during stated tasks. Used a footstool for him to don the Rt gripper sock. He needed assistance to don the Lt sock due to decreased Lt LE mobility. He had to use his UEs to mobilize the Lt foot during LB dressing tasks, brief was set up as underwear to increase functional independence. After handwashing, pt returned to the recliner. Left him with all needs within reach and safety belt fastened.   2nd Session 1:1 tx (44 min) Pt greeted in the recliner, reporting pain to be manageable for tx without additional interventions. He wanted to shave. Using the RW, pt ambulated into the bathroom to retrieve a wash cloth and then over to the sink to engage in shaving while standing (with supervision assist). Pt required vcs for recognizing that he nicked his face and for problem solving in regards to cessation of the bleeding. Vcs also for termination of shaving task, such as capping shaving cream and cleaning up the sink. After handwashing, pt then returned to the recliner. He remembered using the sock aide before. Had him don his socks using sock aide three times with pt  needing min cuing for proper technique and also problem solving during every trial. At end of session pt remained sitting in the recliner, left with all needs within reach and safety belt fastened.    Therapy Documentation Precautions:  Precautions Precautions: Fall Precaution Comments: hx of seizures Restrictions Weight Bearing Restrictions: No LUE Weight Bearing: Weight bearing as tolerated LLE Weight Bearing: Weight bearing as tolerated Pain: pt reported pain to be manageable without additional interventions, stated he "probably" took pain medicine during morning med pass Pain Assessment Pain Scale: 0-10 Pain Score: 2  ADL: ADL Upper Body Bathing: Supervision/safety, Setup Where Assessed-Upper Body Bathing: Edge of bed Lower Body Bathing: Maximal assistance Where Assessed-Lower Body Bathing: Edge of bed Upper Body Dressing: Setup Where Assessed-Upper Body Dressing: Edge of bed Lower Body Dressing: Maximal assistance Where Assessed-Lower Body Dressing: Edge of bed Toileting: Moderate assistance Where Assessed-Toileting: Glass blower/designer: Psychiatric nurse Method: Counselling psychologist: Raised toilet seat      Therapy/Group: Individual Therapy  Quante Pettry A Wrenn Willcox 12/28/2019, 12:15 PM

## 2019-12-29 ENCOUNTER — Inpatient Hospital Stay (HOSPITAL_COMMUNITY): Payer: 59

## 2019-12-29 ENCOUNTER — Encounter: Payer: 59 | Admitting: Physical Therapy

## 2019-12-29 ENCOUNTER — Inpatient Hospital Stay (HOSPITAL_COMMUNITY): Payer: 59 | Admitting: Occupational Therapy

## 2019-12-29 DIAGNOSIS — R569 Unspecified convulsions: Secondary | ICD-10-CM | POA: Diagnosis not present

## 2019-12-29 DIAGNOSIS — R195 Other fecal abnormalities: Secondary | ICD-10-CM | POA: Diagnosis not present

## 2019-12-29 DIAGNOSIS — E876 Hypokalemia: Secondary | ICD-10-CM | POA: Diagnosis not present

## 2019-12-29 DIAGNOSIS — S72145S Nondisplaced intertrochanteric fracture of left femur, sequela: Secondary | ICD-10-CM | POA: Diagnosis not present

## 2019-12-29 LAB — SARS CORONAVIRUS 2 BY RT PCR (HOSPITAL ORDER, PERFORMED IN ~~LOC~~ HOSPITAL LAB): SARS Coronavirus 2: NEGATIVE

## 2019-12-29 MED ORDER — PANTOPRAZOLE SODIUM 40 MG PO TBEC: 40.0000 mg | DELAYED_RELEASE_TABLET | Freq: Every day | ORAL | Status: DC

## 2019-12-29 MED ORDER — CALCIUM POLYCARBOPHIL 625 MG PO TABS: 1250.0000 mg | ORAL_TABLET | Freq: Every day | ORAL | Status: DC

## 2019-12-29 MED ORDER — SACCHAROMYCES BOULARDII 250 MG PO CAPS: 250.0000 mg | ORAL_CAPSULE | Freq: Two times a day (BID) | ORAL | Status: DC

## 2019-12-29 MED ORDER — LEVETIRACETAM 250 MG PO TABS: 1250.0000 mg | ORAL_TABLET | Freq: Two times a day (BID) | ORAL | Status: DC

## 2019-12-29 MED ORDER — HYDROCODONE-ACETAMINOPHEN 5-325 MG PO TABS: 1.0000 | ORAL_TABLET | ORAL | 0 refills | Status: DC | PRN

## 2019-12-29 NOTE — Progress Notes (Signed)
Occupational Therapy Session Note  Patient Details  Name: Carlos [name] MRN: [id number] Date of Birth: 1945/02/22  Today's Date: 12/29/2019 OT Individual Time: 7588-3254 OT Individual Time Calculation (min): 57 min    Short Term Goals: Week 2:  OT Short Term Goal 1 (Week 2): STG = LTGs due to remaining LOS  Skilled Therapeutic Interventions/Progress Updates:    Treatment session with focus on functional mobility, dynamic standing balance, and floor transfers.  Pt received upright in recliner declining bathing/dressing this session. Pt perseverating on working on his leg.  Pt ambulated to therapy gym with RW with supervision.  Engaged in seated exercises to address hip flexion as needed for LB dressing and functional mobility.  Engaged in standing tasks to follow sequencing and address hip flexion, pt continues to be quite verbose and perseverating on tightness and decreased mobility in LLE.  Engaged in hip flexion/extension in stepping and pushing activity simulating pushing a gas pedal.  Pt tends to perform better with increased flexion during functional, familiar tasks as opposed to more abstract tasks.  Pt repeatedly asking about "floor exercises" with therapist recommending pt not get on the floor for anything at this time.  Pt stating that it is easy for him to get on and off the floor, therefore as a planned failure therapist had pt complete floor transfers.  Pt able to get on to floor through quadruped with CGA for safety.  Pt unable to sequence transitioning back off of floor due to decreased mobility of LLE and decreased motor planning.  Pt required mod-max assist to boost back up to therapy mat posteriorly as pt unable to get back in to tall kneeling or quadruped position.  Pt demonstrating increased awareness of limitations after floor transfers, stating frequently "now you don't want me to do floor exercises yet" and able to report floor transfers being too challenging.  Pt ambulated back  to room and to toilet with RW supervision.  Pt completed toileting task and hygiene with distant supervision with RW.  Pt returned to seated upright in recliner with seat belt alarm on and all needs in reach.  Therapy Documentation Precautions:  Precautions Precautions: Fall Precaution Comments: hx of seizures Restrictions Weight Bearing Restrictions: No LUE Weight Bearing: Weight bearing as tolerated LLE Weight Bearing: Weight bearing as tolerated General:   Vital Signs:   Pain:     Therapy/Group: Individual Therapy  Simonne Come 12/29/2019, 12:16 PM

## 2019-12-29 NOTE — Progress Notes (Addendum)
  Patient ID: Carlos [name], male   DOB: February 26, 1945, 74 y.o.   MRN: [id number] Spoke with [facility] who has an offer for pt as long as [facility] is willing to approve. [name] aware of offer and will discuss with supervisor and get back with this worker. She has [name]'s number if questions. Hopefully can move forward with transfer.  10:37 AM Firm bed offer via [facility] and will plan for transfer tomorrow. Have called [name] and informed she is in agreement with. Pt is also aware of this. Dan-PA to order COVID test and MD is aware.  1:09 PM [name] has bed at both [facility] and ruth-sisters have chosen [facility] and will talk with pt regarding this. Work toward discharge tomorrow.

## 2019-12-29 NOTE — Plan of Care (Signed)
  Problem: RH BOWEL ELIMINATION Goal: RH STG MANAGE BOWEL W/MEDICATION W/ASSISTANCE Description: STG Manage Bowel with Medication with Mod I Assistance. Outcome: Progressing

## 2019-12-29 NOTE — Progress Notes (Signed)
Speech Language Pathology Discharge Summary  Patient Details  Name: Carlos [name] MRN: [id number] Date of Birth: 02/02/1946  Today's Date: 12/29/2019 SLP Individual Time: 40-1450 SLP Individual Time Calculation (min): 58 min   Skilled Therapeutic Interventions:   Skilled ST services focused on education and cognitive skills. SLP administered formal cognitive linguistic assessment CLQT in which pt scored WFL on recall, executive function, attention and visuospatial skills. Pt also scored WFL on construction task/problem solving and recall subsections on Cognistat. However pt scored in the lowest range on each subsection and ST believe pt continues to demonstrate functional deficits in memory, mildly complex problem solving, safety awareness and selective attention. Pt agreed that follow up ST services would be beneficial at SNF to assess readiness to return to work. SLP provided education pertaining to strategies for safety awareness and memory. All questions answered to satisfaction. Pt was left in room with call bell within reach and chair alarm set. SLP recommends to continue skilled services.    Patient has met 4 of 4 long term goals.  Patient to discharge at overall Supervision;Modified Independent level.  Reasons goals not met:     Clinical Impression/Discharge Summary:   Pt made great progress meeting 4 out 4 goals, discharging at [place]. Complex problem solving goals were downgraded during course of stay to mildly complex once ST had a better understanding of baseline function. Pt was working, driving and will be management medication, however management of money and medical appointments is done by family, per pt. Pt demonstrated ability manage medication and recall safety protocol. Pt scored WFL on formal cognitive linguistic assessments, CLQT all subsections and Cognistat problem solving and recall subsections, indicating WFL in recall, executive function, attention and  visuospatail skills. However it should be mentioned that pt scored at the very lowest end of [facility] and SLP believes functionally continues to demonstrate deficits in short term recall, selective attention, higher level problem solving and safety awareness. Pt benefited from skilled ST services in order to maximize functional independence and reduce burden of care, requiring supervision at discharge with continued skilled ST services if needed to assess ability to return to work.   Care Partner:  Caregiver Able to Provide Assistance: Yes  Type of Caregiver Assistance: Cognitive  Recommendation:  Skilled Nursing facility;24 hour supervision/assistance  Rationale for SLP Follow Up: Maximize cognitive function and independence;Reduce caregiver burden   Equipment: N/A   Reasons for discharge: Discharged from hospital   Patient/Family Agrees with Progress Made and Goals Achieved: Yes    Brayden Betters  [facility] 12/29/2019, 4:03 PM

## 2019-12-29 NOTE — Progress Notes (Signed)
[facility] PHYSICAL MEDICINE & REHABILITATION PROGRESS NOTE  Subjective/Complaints: Patient seen sitting up in bed this morning.  He initially states he slept well overnight, later states that he was restless.  He has questions regarding discharge.  ROS: Denies CP, SOB, N/V/D  Objective: Vital Signs: Blood pressure 127/72, pulse 88, temperature 98.2 F (36.8 C), resp. rate 18, height 5\' 7"  (1.702 m), weight 57.9 kg, SpO2 99 %. No results found. No results for input(s): WBC, HGB, HCT, PLT in the last 72 hours. No results for input(s): NA, K, CL, CO2, GLUCOSE, BUN, CREATININE, CALCIUM in the last 72 hours.  Intake/Output Summary (Last 24 hours) at 12/29/2019 0840 Last data filed at 12/29/2019 0700 Gross per 24 hour  Intake 780 ml  Output --  Net 780 ml     Pressure Injury 12/15/19 Buttocks Left Stage 2 -  Partial thickness loss of dermis presenting as a shallow open injury with a red, pink wound bed without slough. ruptured blister first documented as a skin tear measured 4.3 x 4 x 0cm (Active)  12/15/19   Location: Buttocks  Location Orientation: Left  Staging: Stage 2 -  Partial thickness loss of dermis presenting as a shallow open injury with a red, pink wound bed without slough.  Wound Description (Comments): ruptured blister first documented as a skin tear measured 4.3 x 4 x 0cm  Present on Admission: Yes     Pressure Injury 12/15/19 Buttocks Right Stage 2 -  Partial thickness loss of dermis presenting as a shallow open injury with a red, pink wound bed without slough. noted on admission documented as a skin tear measured 4.3 x 6.2 x 0cm (Active)  12/15/19   Location: Buttocks  Location Orientation: Right  Staging: Stage 2 -  Partial thickness loss of dermis presenting as a shallow open injury with a red, pink wound bed without slough.  Wound Description (Comments): noted on admission documented as a skin tear measured 4.3 x 6.2 x 0cm  Present on Admission: Yes    Physical  Exam: BP 127/72 (BP Location: Right Arm)   Pulse 88   Temp 98.2 F (36.8 C)   Resp 18   Ht 5\' 7"  (1.702 m)   Wt 57.9 kg   SpO2 99%   BMI 19.99 kg/m  Constitutional: No distress . Vital signs reviewed. HENT: Normocephalic.  Atraumatic. Eyes: EOMI. No discharge. Cardiovascular: No JVD.  RRR. Respiratory: Normal effort.  No stridor.  Bilateral clear to auscultation. GI: Non-distended.  BS +. Skin: Warm and dry.  Intact. Psych: Slowed.  Perseverative at times. Musc: Left hip with improving edema and tenderness Neuro: Alert Motor: Left lower extremity: Hip flexion 2/5, knee extension 4/5, ankle dorsiflexion 4/5, stable  Assessment/Plan: 1. Functional deficits which require 3+ hours per day of interdisciplinary therapy in a comprehensive inpatient rehab setting.  Physiatrist is providing close team supervision and 24 hour management of active medical problems listed below.  Physiatrist and rehab team continue to assess barriers to discharge/monitor patient progress toward functional and medical goals   Care Tool:  Bathing    Body parts bathed by patient: Right arm, Left arm, Chest, Abdomen, Face, Buttocks, Front perineal area, Right upper leg, Left upper leg, Right lower leg, Left lower leg   Body parts bathed by helper: Front perineal area, Buttocks, Right upper leg, Left upper leg, Right lower leg, Left lower leg     Bathing assist Assist Level: Supervision/Verbal cueing     Upper Body Dressing/Undressing Upper body  dressing   What is the patient wearing?: Pull over shirt    Upper body assist Assist Level: Set up assist    Lower Body Dressing/Undressing Lower body dressing      What is the patient wearing?: Pants, Incontinence brief (brief set up as underwear)     Lower body assist Assist for lower body dressing: Supervision/Verbal cueing     Toileting Toileting    Toileting assist Assist for toileting: Supervision/Verbal cueing     Transfers Chair/bed  transfer  Transfers assist     Chair/bed transfer assist level: Supervision/Verbal cueing Chair/bed transfer assistive device: Programmer, multimedia   Ambulation assist      Assist level: Supervision/Verbal cueing Assistive device: Walker-rolling Max distance: 50'   Walk 10 feet activity   Assist     Assist level: Supervision/Verbal cueing Assistive device: Walker-rolling   Walk 50 feet activity   Assist Walk 50 feet with 2 turns activity did not occur: Safety/medical concerns (LLE pain, generalized weakness, decreased balance/postural control)  Assist level: Supervision/Verbal cueing Assistive device: Walker-rolling    Walk 150 feet activity   Assist Walk 150 feet activity did not occur: Safety/medical concerns (LLE pain, generalized weakness, decreased balance/postural control)  Assist level: Supervision/Verbal cueing Assistive device: Walker-rolling    Walk 10 feet on uneven surface  activity   Assist     Assist level: Contact Guard/Touching assist Assistive device: Aeronautical engineer Will patient use wheelchair at discharge?: No Type of Wheelchair: Manual    Wheelchair assist level: Supervision/Verbal cueing Max wheelchair distance: 166ft    Wheelchair 50 feet with 2 turns activity    Assist        Assist Level: Supervision/Verbal cueing   Wheelchair 150 feet activity     Assist      Assist Level: Supervision/Verbal cueing    Medical Problem List and Plan: 1.  Decreased functional mobility secondary to left intertrochanteric femur fracture. Status post intramedullary fixation 12/11/2019.  Weightbearing as tolerated  Continue CIR, plan for SNF 2.  Antithrombotics:             -DVT/anticoagulation: SCDs, Lovenox ordered-monitor for bleeding, no signs at present   Dopplers negative for lower extremity DVT             -antiplatelet therapy: Aspirin 81 BID 3. Pain Management: Robaxin and  hydrocodone as needed  Muscle rub prn   Controlled with meds 11/23  Monitor with increased mobility 4. Mood: Team support             -antipsychotic agents: N/A 5. Neuropsych: This patient is not fully capable of making decisions on his own behalf, suspect baseline dementia. 6. Skin/Wound Care: Routine skin checks  Continue Xeroform to b/l buttock ulcers-healing  DCed staples 7. Fluids/Electrolytes/Nutrition: Routine in and outs. 8.  Acute blood loss anemia.    Hemoglobin 10.2 on 11/16  Hemoccult positive on 11/13-see #10 9.  Seizure disorder.    Keppra 1250 mg twice daily.  EEG negative  No breakthrough seizures from admission to rehab-11/23 10.  Rectal adenocarcinoma.  Follow-up oncology service with Dr. Annamaria Boots as well as radiation oncology Dr. Lisbeth Renshaw.  Radiation treatments currently on hold after hip fracture and Xeloda has been stopped for now. 11.  Hypokalemia  Potassium 4.4 on 11/19  Magnesium within normal limits on 11/15  Daily supplementation started on 11/10, increased on 11/12, decreased on 11/16, DC'd on 11/23  Continue to monitor 12.  Severe  hypoalbuminemia  Supplement initiated on 11/10 13.  Red urine-?  Resolved  UA + hemoglobin, urine culture NG 14.  GI-baseline  Loose stools with "gurgling"/discomfort -?  Secondary to chemo +/- dietary  Fiber ordered, increased on 11/12  Probiotic ordered on 11/12  Encouraged avoiding dairy   Simethicone started on 11/12  Overall improved  LOS: 14 days A FACE TO FACE EVALUATION WAS PERFORMED  Sameria Morss Lorie Phenix 12/29/2019, 8:40 AM

## 2019-12-29 NOTE — Progress Notes (Signed)
Occupational Therapy Discharge Summary  Patient Details  Name: Carlos [name] MRN: [id number] Date of Birth: Apr 18, 1945  Patient has met 10 of 10 long term goals due to improved activity tolerance, improved balance, postural control, ability to compensate for deficits and improved awareness.  Patient to discharge at overall Supervision level.  Patient's care partner unavailable to provide the necessary physical and cognitive assistance at discharge.  Patient continues to benefit from 24/7 supervision due to high fall risk secondary to impulsivity and decreased safety awareness, benefiting from supervision to increase recall of safety strategies for self-care tasks and functional mobility with RW.  Reasons goals not met: N/A  Recommendation:  Patient will benefit from ongoing skilled OT services in skilled nursing facility setting to continue to advance functional skills in the area of BADL and Reduce care partner burden.  Equipment: BSC  Reasons for discharge: treatment goals met and discharge from hospital  Patient/family agrees with progress made and goals achieved: Yes  OT Discharge Precautions/Restrictions  Precautions Precautions: Fall Precaution Comments: hx of seizures Restrictions Weight Bearing Restrictions: No General   Vital Signs Therapy Vitals Temp: 98.4 F (36.9 C) Pulse Rate: 87 Resp: 17 BP: 112/71 Patient Position (if appropriate): Sitting Oxygen Therapy SpO2: 100 % O2 Device: Room Air Pain Pain Assessment Pain Score: 0-No pain ADL ADL Eating: Modified independent Where Assessed-Eating: Chair Grooming: Modified independent Where Assessed-Grooming: Standing at sink Upper Body Bathing: Supervision/safety, Setup Where Assessed-Upper Body Bathing: Shower Lower Body Bathing: Supervision/safety Where Assessed-Lower Body Bathing: Shower Upper Body Dressing: Setup Where Assessed-Upper Body Dressing: Edge of bed Lower Body Dressing:  Supervision/safety Where Assessed-Lower Body Dressing: Edge of bed Toileting: Supervision/safety Where Assessed-Toileting: Glass blower/designer: Distant supervision Armed forces technical officer Method: Counselling psychologist: Energy manager: Close supervison Clinical cytogeneticist Method: Ambulating, Educational psychologist: Facilities manager: Close supervision Social research officer, government Method: Heritage manager: Radio broadcast assistant Vision Baseline Vision/History: Wears glasses Wears Glasses: At all times Patient Visual Report: No change from baseline Vision Assessment?: No apparent visual deficits Perception  Perception: Within Functional Limits Praxis Praxis: Intact Cognition Overall Cognitive Status: History of cognitive impairments - at baseline Arousal/Alertness: Awake/alert Orientation Level: Oriented X4 Attention: Selective;Sustained Sustained Attention: Appears intact Selective Attention: Impaired Selective Attention Impairment: Verbal complex;Functional complex Memory: Impaired Memory Impairment: Decreased recall of new information Awareness: Impaired Awareness Impairment: Anticipatory impairment (safety awareness) Problem Solving: Impaired Problem Solving Impairment: Functional complex Safety/Judgment: Impaired Comments: pt requires cues for safety awareness Sensation Sensation Light Touch: Appears Intact Proprioception: Appears Intact Coordination Gross Motor Movements are Fluid and Coordinated: No Fine Motor Movements are Fluid and Coordinated: No Coordination and Movement Description: mild uncoordination due to pain and stiffness in LLE, generalized weakness, and poor endurance Finger Nose Finger Test: mild dysmetria bilaterally Heel Shin Test: Decreased ROM on LLE, WFL on RLE Motor  Motor Motor: Abnormal postural alignment and control Motor - Skilled Clinical Observations: grossly  uncoordinated due to pain, stiffness, generalized weakness, and decresaed activity tolerance Mobility  Bed Mobility Bed Mobility: Rolling Right;Rolling Left;Supine to Sit;Sit to Supine Rolling Right: Independent with assistive device Rolling Left: Independent with assistive device Supine to Sit: Independent with assistive device Sit to Supine: Independent with assistive device Transfers Sit to Stand: Supervision/Verbal cueing Stand to Sit: Supervision/Verbal cueing  Trunk/Postural Assessment  Cervical Assessment Cervical Assessment: Within Functional Limits Thoracic Assessment Thoracic Assessment: Exceptions to [facility] (kyphosis) Lumbar Assessment Lumbar Assessment: Exceptions to [facility] (posterior pelvic tilt) Postural Control Postural  Control: Deficits on evaluation  Balance Balance Balance Assessed: Yes Static Sitting Balance Static Sitting - Balance Support: Feet supported;No upper extremity supported Static Sitting - Level of Assistance: 7: Independent Dynamic Sitting Balance Dynamic Sitting - Balance Support: Feet supported;No upper extremity supported Dynamic Sitting - Level of Assistance: 7: Independent Static Standing Balance Static Standing - Balance Support: Bilateral upper extremity supported (RW) Static Standing - Level of Assistance: 5: Stand by assistance (supervision) Dynamic Standing Balance Dynamic Standing - Balance Support: Bilateral upper extremity supported (RW) Dynamic Standing - Level of Assistance: 5: Stand by assistance (supervision) Extremity/Trunk Assessment RUE Assessment RUE Assessment: Within Functional Limits LUE Assessment LUE Assessment: Within Functional Limits   Jelissa Espiritu, [facility] 12/29/2019, 4:23 PM

## 2019-12-29 NOTE — Progress Notes (Signed)
Physical Therapy Session Note  Patient Details  Name: Carlos [name] MRN: [id number] Date of Birth: 1945-08-26  Today's Date: 12/29/2019 PT Individual Time: 0801-0854 PT Individual Time Calculation (min): 53 min   Short Term Goals: Week 1:  PT Short Term Goal 1 (Week 1): Pt will perform stand<>pivot transfers with LRAD CGA PT Short Term Goal 1 - Progress (Week 1): Met PT Short Term Goal 2 (Week 1): Pt will ambulate 73f with LRAD CGA PT Short Term Goal 2 - Progress (Week 1): Met PT Short Term Goal 3 (Week 1): Pt will perform simulated car transfer with LRAD and CGA PT Short Term Goal 3 - Progress (Week 1): Met Week 2:  PT Short Term Goal 1 (Week 2): STG=LTG due to LOS  Skilled Therapeutic Interventions/Progress Updates:   Received pt supine in bed, pt agreeable to therapy, and denied any pain during session but continues to report L hip "stiffness" and "soreness." Session with emphasis on functional mobility/transfers, generalized strengthening, dynamic standing balance/coordination, ambulation, simulated car transfers, and improved activity tolerance. Pt transferred supine<>sitting EOB with HOB elevated and supervision/mod I with increased time. Pt ambulated 551fwith RW and supervision to sink and stood at sink and brushed teeth with supervision. Pt ambulated 10032f 2 trials to/from ortho gym with RW and supervision with cues for upright posture/gaze. Pt performed ambulatory simulated car transfer with RW and supervision with cues for turning technique and safety. Pt unable to actively lift LLE into car having to use UEs to assist LE into car. Worked on dynamic standing balance, reaction time, visual scanning, and problem solving performing the following activities on BITS system with CGA for balance: -trail making part A 2:48 seconds with 3 errors -maze 1:19 with 0 errors -multi color visual pursuit with 78% accuracy in 38 seconds with 15 accurate hits Pt transferred sit<>stand with RW and  supervision and performed alternating toe taps to 4-5in step 3x8 with emphasis on L hip flexor strengthening and ROM. Noted with with improvements in L hip flexor strength. Pt performed the following exercises standing with BUE support on RW, CGA, and cues for technique: -mini squats x8 -hip flexion 2x10 bilaterally with visual target to lift L knee to height of walker. Pt initially started with decreased L hip ROM but with increased reps able to achieve equal hip flexion ROM bilaterally.  -heel raises 2x12 Concluded session with pt sitting in recliner on Roho cushion, needs within reach, and seatbelt alarm on.   Therapy Documentation Precautions:  Precautions Precautions: Fall Precaution Comments: hx of seizures Restrictions Weight Bearing Restrictions: No LUE Weight Bearing: Weight bearing as tolerated LLE Weight Bearing: Weight bearing as tolerated   Therapy/Group: Individual Therapy AnnAlfonse Alpers, DPT   12/29/2019, 7:21 AM

## 2019-12-30 DIAGNOSIS — S72145S Nondisplaced intertrochanteric fracture of left femur, sequela: Secondary | ICD-10-CM | POA: Diagnosis not present

## 2019-12-30 DIAGNOSIS — M25552 Pain in left hip: Secondary | ICD-10-CM | POA: Diagnosis not present

## 2019-12-30 DIAGNOSIS — R569 Unspecified convulsions: Secondary | ICD-10-CM | POA: Diagnosis not present

## 2019-12-30 DIAGNOSIS — E876 Hypokalemia: Secondary | ICD-10-CM | POA: Diagnosis not present

## 2019-12-30 NOTE — Progress Notes (Signed)
Patient discharged off of unit with all belongings via EMS. Walker and bed side commode sent via EMS. No complications noted at this time. Called to give report and was on hold for 10 minutes. Will attempt to give report again. [place]

## 2019-12-30 NOTE — Progress Notes (Signed)
Inpatient Rehabilitation Care Coordinator  Discharge Note  The overall goal for the admission was met for:   Discharge location: [facility]  Length of Stay: No-15 DAYS  Discharge activity level: Yes-SUPERVISION-MIN LEVEL  Home/community participation: Yes  Services provided included: MD, RD, PT, OT, SLP, RN, CM, Pharmacy, Neuropsych and SW  Financial Services: Private Insurance: [place]  Follow-up services arranged: Other: NHP  Comments (or additional information):NEEDED SHORT TERM NHP DUE TO DOES NOT HAVE 24/7 CAREGIVERS AT HOME. SAFETY ISSUES WITH COGNITION ISSUES PREMORBID AND HIGH RISK TO FALL  Patient/Family verbalized understanding of follow-up arrangements: Yes  Individual responsible for coordination of the follow-up plan: [name]-SISTER-HCPOA [phone]  Confirmed correct DME delivered: Elease Hashimoto 12/30/2019    Lilla Callejo, [place]

## 2019-12-30 NOTE — Plan of Care (Signed)
Problem: Consults Goal: RH GENERAL PATIENT EDUCATION Description: See Patient Education module for education specifics. Outcome: Completed/Met   Problem: RH BOWEL ELIMINATION Goal: RH STG MANAGE BOWEL WITH ASSISTANCE Description: STG Manage Bowel with Mod I Assistance. Outcome: Completed/Met Goal: RH STG MANAGE BOWEL W/MEDICATION W/ASSISTANCE Description: STG Manage Bowel with Medication with Mod I Assistance. Outcome: Completed/Met   Problem: RH BLADDER ELIMINATION Goal: RH STG MANAGE BLADDER WITH ASSISTANCE Description: STG Manage Bladder With Mod I Assistance Outcome: Completed/Met   Problem: RH SKIN INTEGRITY Goal: RH STG SKIN FREE OF INFECTION/BREAKDOWN Description: Mod I  Outcome: Completed/Met Goal: RH STG MAINTAIN SKIN INTEGRITY WITH ASSISTANCE Description: STG Maintain Skin Integrity With Mod I Assistance. Outcome: Completed/Met   Problem: RH SAFETY Goal: RH STG ADHERE TO SAFETY PRECAUTIONS W/ASSISTANCE/DEVICE Description: STG Adhere to Safety Precautions With Mod I Assistance/Device. Outcome: Completed/Met   Problem: RH KNOWLEDGE DEFICIT GENERAL Goal: RH STG INCREASE KNOWLEDGE OF SELF CARE AFTER HOSPITALIZATION Description: Mod I Outcome: Completed/Met

## 2019-12-30 NOTE — Progress Notes (Signed)
[place] PHYSICAL MEDICINE & REHABILITATION PROGRESS NOTE  Subjective/Complaints: Patient seen sitting up in bed this AM.  He states he slept well overnight. He is aware of plans for discharge to SNF today.   ROS: Denies CP, SOB, N/V/D  Objective: Vital Signs: Blood pressure 127/67, pulse 83, temperature 97.9 F (36.6 C), resp. rate 18, height 5\' 7"  (1.702 m), weight 58.2 kg, SpO2 100 %. No results found. No results for input(s): WBC, HGB, HCT, PLT in the last 72 hours. No results for input(s): NA, K, CL, CO2, GLUCOSE, BUN, CREATININE, CALCIUM in the last 72 hours.  Intake/Output Summary (Last 24 hours) at 12/30/2019 0857 Last data filed at 12/30/2019 0737 Gross per 24 hour  Intake 890 ml  Output --  Net 890 ml     Pressure Injury 12/15/19 Buttocks Left Stage 2 -  Partial thickness loss of dermis presenting as a shallow open injury with a red, pink wound bed without slough. ruptured blister first documented as a skin tear measured 4.3 x 4 x 0cm (Active)  12/15/19   Location: Buttocks  Location Orientation: Left  Staging: Stage 2 -  Partial thickness loss of dermis presenting as a shallow open injury with a red, pink wound bed without slough.  Wound Description (Comments): ruptured blister first documented as a skin tear measured 4.3 x 4 x 0cm  Present on Admission: Yes     Pressure Injury 12/15/19 Buttocks Right Stage 2 -  Partial thickness loss of dermis presenting as a shallow open injury with a red, pink wound bed without slough. noted on admission documented as a skin tear measured 4.3 x 6.2 x 0cm (Active)  12/15/19   Location: Buttocks  Location Orientation: Right  Staging: Stage 2 -  Partial thickness loss of dermis presenting as a shallow open injury with a red, pink wound bed without slough.  Wound Description (Comments): noted on admission documented as a skin tear measured 4.3 x 6.2 x 0cm  Present on Admission: Yes    Physical Exam: BP 127/67 (BP Location: Right  Arm)    Pulse 83    Temp 97.9 F (36.6 C)    Resp 18    Ht 5\' 7"  (1.702 m)    Wt 58.2 kg    SpO2 100%    BMI 20.10 kg/m  Constitutional: No distress . Vital signs reviewed. HENT: Normocephalic.  Atraumatic. Eyes: EOMI. No discharge. Cardiovascular: No JVD.  RRR. Respiratory: Normal effort.  No stridor.  Bilateral clear to auscultation. GI: Non-distended.  BS +. Skin: Warm and dry.  Intact. Psych: Slowed.  Somewhat flat. Musc: Left hip with edema and tenderness, improving. Neuro: Alert Motor: Left lower extremity: Hip flexion 2/5, knee extension 4/5, ankle dorsiflexion 4/5, unchanged  Assessment/Plan: 1. Functional deficits which require 3+ hours per day of interdisciplinary therapy in a comprehensive inpatient rehab setting.  Physiatrist is providing close team supervision and 24 hour management of active medical problems listed below.  Physiatrist and rehab team continue to assess barriers to discharge/monitor patient progress toward functional and medical goals   Care Tool:  Bathing    Body parts bathed by patient: Right arm, Left arm, Chest, Abdomen, Face, Buttocks, Front perineal area, Right upper leg, Left upper leg, Right lower leg, Left lower leg   Body parts bathed by helper: Front perineal area, Buttocks, Right upper leg, Left upper leg, Right lower leg, Left lower leg     Bathing assist Assist Level: Supervision/Verbal cueing     Upper  Body Dressing/Undressing Upper body dressing   What is the patient wearing?: Pull over shirt    Upper body assist Assist Level: Set up assist    Lower Body Dressing/Undressing Lower body dressing      What is the patient wearing?: Pants, Incontinence brief (brief set up as underwear)     Lower body assist Assist for lower body dressing: Supervision/Verbal cueing     Toileting Toileting    Toileting assist Assist for toileting: Supervision/Verbal cueing     Transfers Chair/bed transfer  Transfers assist      Chair/bed transfer assist level: Supervision/Verbal cueing Chair/bed transfer assistive device: Programmer, multimedia   Ambulation assist      Assist level: Supervision/Verbal cueing Assistive device: Walker-rolling Max distance: 186ft   Walk 10 feet activity   Assist     Assist level: Supervision/Verbal cueing Assistive device: Walker-rolling   Walk 50 feet activity   Assist Walk 50 feet with 2 turns activity did not occur: Safety/medical concerns (LLE pain, generalized weakness, decreased balance/postural control)  Assist level: Supervision/Verbal cueing Assistive device: Walker-rolling    Walk 150 feet activity   Assist Walk 150 feet activity did not occur: Safety/medical concerns (LLE pain, generalized weakness, decreased balance/postural control)  Assist level: Supervision/Verbal cueing Assistive device: Walker-rolling    Walk 10 feet on uneven surface  activity   Assist     Assist level: Contact Guard/Touching assist Assistive device: Aeronautical engineer Will patient use wheelchair at discharge?: No Type of Wheelchair: Manual    Wheelchair assist level: Supervision/Verbal cueing Max wheelchair distance: 181ft    Wheelchair 50 feet with 2 turns activity    Assist        Assist Level: Supervision/Verbal cueing   Wheelchair 150 feet activity     Assist      Assist Level: Supervision/Verbal cueing    Medical Problem List and Plan: 1.  Decreased functional mobility secondary to left intertrochanteric femur fracture. Status post intramedullary fixation 12/11/2019.  Weightbearing as tolerated  DC today  Will see patient for hospital follow-up in 1 month post-discharge  Covid test negative 2.  Antithrombotics:             -DVT/anticoagulation: SCDs, Lovenox ordered-monitor for bleeding, no signs at present   Dopplers negative for lower extremity DVT             -antiplatelet therapy: Aspirin  81 BID 3. Pain Management: Robaxin and hydrocodone as needed  Muscle rub prn   Controlled with meds 11/24  Monitor with increased mobility 4. Mood: Team support             -antipsychotic agents: N/A 5. Neuropsych: This patient is not fully capable of making decisions on his own behalf, suspect baseline dementia. 6. Skin/Wound Care: Routine skin checks  Continue Xeroform to b/l buttock ulcers-healing  DCed staples 7. Fluids/Electrolytes/Nutrition: Routine in and outs. 8.  Acute blood loss anemia.    Hemoglobin 10.2 on 11/16  Hemoccult positive on 11/13-see #10 9.  Seizure disorder.    Keppra 1250 mg twice daily.  EEG negative  No breakthrough seizures from admission to rehab-11/24 10.  Rectal adenocarcinoma.  Follow-up oncology service with Dr. Annamaria Boots as well as radiation oncology Dr. Lisbeth Renshaw.  Radiation treatments currently on hold after hip fracture and Xeloda has been stopped for now. 11.  Hypokalemia  Potassium 4.4 on 11/19, continue to follow in outpatient setting  Magnesium within normal limits on 11/15  Daily supplementation started on 11/10, increased on 11/12, decreased on 11/16, DC'd on 11/23  Continue to monitor 12.  Severe hypoalbuminemia  Supplement initiated on 11/10 13.  Red urine-?  Resolved  UA + hemoglobin, urine culture NG 14.  GI-baseline  Loose stools with "gurgling"/discomfort -?  Secondary to chemo +/- dietary  Fiber ordered, increased on 11/12  Probiotic ordered on 11/12  Encouraged avoiding dairy   Simethicone started on 11/12  Appears to be back to baseline  > 30 minutes spent in total in discharge planning between myself and PA regarding aforementioned, as well discussion regarding, follow-up appointments, discharge medications, discharge recommendations, discharge venue  LOS: 15 days A FACE TO [place] 12/30/2019, 8:57 AM

## 2020-01-04 ENCOUNTER — Telehealth: Payer: Self-pay | Admitting: Radiation Oncology

## 2020-01-04 NOTE — Progress Notes (Signed)
[place]   [phone] [phone]   Clinic Follow up Note   Patient Care Team: [name], MD as PCP - General (Family Medicine) Cameron Sprang, MD as Consulting Physician (Neurology) Jonnie Finner, RN as Oncology Nurse Navigator Truitt Merle, MD as Consulting Physician (Hematology) Ronnette Juniper, MD as Consulting Physician (Gastroenterology)  Date of Service:  01/06/2020  CHIEF COMPLAINT: F/u rectal cancer  SUMMARY OF ONCOLOGIC HISTORY: Oncology History Overview Note  Cancer Staging Rectal adenocarcinoma [facility]) Staging form: Colon and Rectum, AJCC 8th Edition - Clinical stage from 06/12/2019: cT4, cN1 - Unsigned    Rectal adenocarcinoma ([place])  05/29/2019 Procedure   Colonoscopy per Dr. Therisa Doyne A digital rectal exam was normal  frond-like fungating infiltrative non-obstructing large mass in the rectum. The mass was partially circumferential, measured 5 cm in length. The colonoscopy also showed 11 sessile polyps in the transverse and asecnding colon and hepatic flexure.    05/29/2019 Initial Biopsy   Pathology on the rectal biopsy showed at least intramucosal adenocarcinoma involving tubulovillous adenoma with high grade dysplasia.   06/05/2019 Imaging   CT CAP IMPRESSION: 1. Cholelithiasis and gallbladder wall thickening with equivocal pericholecystic inflammation. Acute cholecystitis not excluded. If there is clinical suspicion for acute cholecystitis, recommend ultrasound evaluation. 2. Approximately 5 cm irregular mass within the distal sigmoid colon with possible extension through the wall. No definite abnormal adjacent lymph nodes. 3. Mild omental haziness-nonspecific. Metastatic disease not excluded. 4. Indeterminate 3-4 mm RIGHT pulmonary nodules which could represent metastatic disease. These are too small for PET CT evaluation. Consider CT follow-up. 5. 2.5 cm irregular cystic mass within the RIGHT kidney. Elective MRI  recommended for further evaluation. 6. 4 mm nonobstructing LEFT LOWER pole renal calculus. 7. Coronary artery disease. 8. Aortic Atherosclerosis (ICD10-I70.0).   06/12/2019 Imaging   Staging MRI pelvis  FINDINGS: TUMOR LOCATION Tumor distance from Anal Verge/Skin Surface:  11.4 cm Tumor distance to Internal Anal Sphincter: 6.5 cm TUMOR DESCRIPTION Circumferential Extent: 100% Tumor Length: 8.3 cm T - CATEGORY Extension through Muscularis Propria: Yes>2mm=T3d Shortest Distance of any tumor/node from Mesorectal Fascia: 0 mm, along the left lateral and posterior walls (tumor abuts the left lateral pelvic sidewall and sacrum) Extramural Vascular Invasion/Tumor Thrombus: No Invasion of Anterior Peritoneal Reflection: No Involvement of Adjacent Organs or Pelvic Sidewall: Yes, involving the left lateral pelvic sidewall =T4 Levator Ani Involvement: No N - CATEGORY Mesorectal Lymph Nodes >=54mm: 2=N1 Extra-mesorectal Lymphadenopathy: No Other:  None. IMPRESSION: Rectal adenocarcinoma T stage: T4 Rectal adenocarcinoma N stage:  N1 Distance from tumor to the internal anal sphincter is 6.5 cm.   06/18/2019 Initial Diagnosis   Rectal adenocarcinoma ([place])   07/02/2019 - 10/08/2019 Chemotherapy   Total Neoadjuvant FOLFOX q2weeks starting 07/02/19-10/08/19,  8 cycles   07/13/2019 PET scan   IMPRESSION: 1. Known rectal mass is intensely hypermetabolic compatible with primary rectal adenocarcinoma. No findings of FDG avid nodal metastasis or distant metastatic disease. 2. Hyperdense kidney cysts are identified bilaterally favored to represent hemorrhagic cyst. 3. Nonobstructing left renal calculi. 4. Aortic atherosclerosis, in addition to lad coronary artery disease. Please note that although the presence of coronary artery calcium documents the presence of coronary artery disease, the severity of this disease and any potential stenosis cannot be assessed on this non-gated CT examination.  Assessment for potential risk factor modification, dietary therapy or pharmacologic therapy may be warranted, if clinically indicated. Aortic Atherosclerosis (ICD10-I70.0). 5. Gallstones.   09/11/2019 - 09/14/2019 Hospital Admission   Acute  AMS, seizure 09/11/19 -Developed acute altered mental status day after Cycle 6 chemo (09/11/19).  -He was admitted for work up, EEG showed epileptic spike. ID work up negative, MRI negative for metastasis.    09/11/2019 Imaging   MRI Brain  IMPRESSION: Incomplete study. Images obtained reveal no acute abnormality. Negative for acute infarct.   Atrophy and mild ventricular enlargement stable from prior studies.   09/11/2019 Imaging   CT head  IMPRESSION: Mild ventricular prominence with normal appearing sulci. Question early communicating hydrocephalus. Brain parenchyma appears unremarkable. No acute infarct. No mass or hemorrhage.   Foci of arterial vascular calcification noted. There is mucosal thickening in several ethmoid air cells.   There is probable cerumen in the right external auditory canal.     09/23/2019 Imaging   CT AP  IMPRESSION: 1. Solid-appearing left eccentric upper rectal mass measures about 6.7 by 2.9 cm. This is difficult to compare directly to the prior exam given differences in orientation and degree of distension of the rectum. There is some prominence of stool in the sigmoid colon and rectum on today's examination, but the mass is not obstructive. 2. Other imaging findings of potential clinical significance: Cholelithiasis. Complex bilateral renal lesions are similar to prior exams. Nonobstructive left nephrolithiasis. Mild prostatomegaly with nodular prominence the upper margin of the prostate gland. Lumbar spondylosis and degenerative disc disease causing multilevel foraminal impingement. 3. Aortic atherosclerosis.   Aortic Atherosclerosis (ICD10-I70.0).   11/02/2019 - 12/09/2019 Chemotherapy   Concurrent chemo RT with  Oral Xeloda 1568m BID on days of RT starting 11/02/19-12/09/19   11/02/2019 - 12/09/2019 Radiation Therapy   Concurrent chemo RT by Dr MLisbeth Renshawwith Xeloda  starting 11/02/19-12/09/19      CURRENT THERAPY:  PENDING Anal surgery 02/10/20  INTERVAL HISTORY:  LNYCHOLAS RAYNERis here for a follow up. He presents to the clinic with his sister Carlos [name] He has been in Nursing home for the past week. The nursing home feels he is doing better and would like to release, but his sister does not feel he is ready to leave given he is still using walker. He would be sleeping upstairs at his sister's home. He is ambulated better.  He notes he still has left knee pain when walking and feels his gait is uneven. He was started on Flomax in hospital. He notes prostate enlargement in the past and seen by urologist years ago. He is fine to wean off flomax.  He notes his stool is loose and fragmented with BM 3 times a day and no blood present. He saw Dr WMorton Stallyesterday and wants to operate soon for his cancer, but wants him to be ambulating independently beforehand. He does not remember if he had rectal exam.    REVIEW OF SYSTEMS:   Constitutional: Denies fevers, chills or abnormal weight loss Eyes: Denies blurriness of vision Ears, nose, mouth, throat, and face: Denies mucositis or sore throat Respiratory: Denies cough, dyspnea or wheezes Cardiovascular: Denies palpitation, chest discomfort or lower extremity swelling Gastrointestinal:  Denies nausea, heartburn or change in bowel habits Skin: Denies abnormal skin rashes MSK: (+0 Left knee pain Lymphatics: Denies new lymphadenopathy or easy bruising Neurological:Denies numbness, tingling or new weaknesses Behavioral/Psych: Mood is stable, no new changes  All other systems were reviewed with the patient and are negative.  MEDICAL HISTORY:  Past Medical History:  Diagnosis Date  . Anxiety   . Benign prostatic hyperplasia 03/30/2013   10/1 IMO update  . Cancer ([place]    .  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 Seizures ([place])     SURGICAL HISTORY: Past Surgical History:  Procedure Laterality Date  . INTRAMEDULLARY (IM) NAIL INTERTROCHANTERIC Left 12/11/2019   Procedure: INTRAMEDULLARY (IM) NAIL INTERTROCHANTRIC;  Surgeon: Rod Can, MD;  Location: [facility];  Service: Orthopedics;  Laterality: Left;  . PORTACATH PLACEMENT N/A 07/01/2019   Procedure: PORT ULTRASOUND GUIDED PLACEMENT;  Surgeon: Alphonsa Overall, MD;  Location: [facility];  Service: General;  Laterality: N/A;  . PROSTATE SURGERY  2004  . TONSILECTOMY, ADENOIDECTOMY, BILATERAL MYRINGOTOMY AND TUBES      I have reviewed the social history and family history with the patient and they are unchanged from previous note.  ALLERGIES:  has No Known Allergies.  MEDICATIONS:  Current Outpatient Medications  Medication Sig Dispense Refill  . aspirin (ASPIRIN CHILDRENS) 81 MG chewable tablet Chew 1 tablet (81 mg total) by mouth 2 (two) times daily with a meal. 90 tablet 0  . HYDROcodone-acetaminophen (NORCO/VICODIN) 5-325 MG tablet Take 1 tablet by mouth every 4 (four) hours as needed for moderate pain. 5 tablet 0  . levETIRAcetam (KEPPRA) 250 MG tablet Take 5 tablets (1,250 mg total) by mouth 2 (two) times daily.    Marland Kitchen loperamide (IMODIUM) 2 MG capsule Take 1 capsule (2 mg total) by mouth every 6 (six) hours as needed for diarrhea or loose stools. 30 capsule 0  . melatonin 3 MG TABS tablet Take 1 tablet (3 mg total) by mouth at bedtime as needed (insomnia).  0  . methocarbamol (ROBAXIN) 500 MG tablet Take 1 tablet (500 mg total) by mouth every 6 (six) hours as needed for muscle spasms.    . Multiple Vitamin (MULTIVITAMIN WITH MINERALS) TABS tablet Take 1 tablet by mouth daily.    . pantoprazole (PROTONIX) 40 MG tablet Take 1 tablet (40 mg total) by mouth daily.    . polycarbophil (FIBERCON) 625 MG tablet Take 2 tablets (1,250 mg total) by mouth daily.    Marland Kitchen saccharomyces boulardii  (FLORASTOR) 250 MG capsule Take 1 capsule (250 mg total) by mouth 2 (two) times daily.    . tamsulosin (FLOMAX) 0.4 MG CAPS capsule Take 1 capsule (0.4 mg total) by mouth daily. 30 capsule    No current facility-administered medications for this visit.    PHYSICAL EXAMINATION: ECOG PERFORMANCE STATUS: 2 - Symptomatic, <50% confined to bed  Vitals:   01/06/20 1422  BP: 128/85  Pulse: 91  Resp: 19  Temp: (!) 97 F (36.1 C)  SpO2: 100%   Filed Weights   01/06/20 1422  Weight: 128 lb (58.1 kg)    Due to COVID19 we will limit examination to appearance. Patient had no complaints.  GENERAL:alert, no distress and comfortable SKIN: skin color normal, no rashes or significant lesions EYES: normal, Conjunctiva are pink and non-injected, sclera clear  NEURO: alert & oriented x 3 with fluent speech   LABORATORY DATA:  I have reviewed the data as listed CBC Latest Ref Rng & Units 01/06/2020 12/22/2019 12/21/2019  WBC 4.0 - 10.5 K/uL 4.7 4.5 4.8  Hemoglobin 13.0 - 17.0 g/dL 12.0(L) 10.2(L) 9.8(L)  Hematocrit 39 - 52 % 36.7(L) 32.1(L) 30.6(L)  Platelets 150 - 400 K/uL 212 316 292     CMP Latest Ref Rng & Units 01/06/2020 12/25/2019 12/21/2019  Glucose 70 - 99 mg/dL 93 93 91  BUN 8 - 23 mg/dL _0 Creatinine 0.61 - 1.24 mg/dL 0.78 0.89 0.84  Sodium 135 - 145 mmol/L 141  140 138  Potassium 3.5 - 5.1 mmol/L 4.2 4.4 4.1  Chloride 98 - 111 mmol/L 105 105 102  CO2 22 - 32 mmol/L _0 Calcium 8.9 - 10.3 mg/dL 9.7 9.3 8.9  Total Protein 6.5 - 8.1 g/dL 7.0 - -  Total Bilirubin 0.3 - 1.2 mg/dL 0.5 - -  Alkaline Phos 38 - 126 U/L 178(H) - -  AST 15 - 41 U/L 21 - -  ALT 0 - 44 U/L 20 - -      RADIOGRAPHIC STUDIES: I have personally reviewed the radiological images as listed and agreed with the findings in the report. No results found.   ASSESSMENT & PLAN:  Carlos [name] is a 74 y.o. male with    1. Adenocarcinoma of the rectum, cT4N1M0 stage III -He was recently  diagnosed in 4/2021with locally advanced stage III rectal cancer.  -He chemotherapy with 8 cycles ofFOLFOX 07/02/19-10/08/19 and concurrent chemoRT with Xeloda1542m BIDfor 5-6 weeks 11/02/19-12/07/19. He missed last 2 treatments due to hospitalization for Seizure and left femur fracture.  -He will proceed with MRI Pelvis and CT scan in 1-2 weeks, before surgery  -He was seen by Dr WMorton Stallyesterday who plans to proceed with surgery on 02/10/20 so far. I will obtain note and coordinate his preop scan if needed -I discussed his risk of recurrence after surgery is probably around 40-50%. I discussed the surveillance plan, which is a physical exam and lab test (including CBC, CMP and CEA) every 3 months for the first 2 years, then every 6-12 months, colonoscopy in one year, and surveillance CT scan every 6-12 month for up to 5 year.  -He will keep PAC for at least 1-2 years given his risk of recurrence. Continue flushes every 6-8 weeks.  -Labs today, CBC and CMP WNL except Hg 12, alk Phos 178. F/u in 2 months.    2. Seizure 09/11/19, Left femur fracture  -He had another Seizure on in 12/2019. When he went to ED on 12/15/19 he fell out due to seizure resulting in a left femur fracture.  -He was in hospital from 11/9-11/24 and then rehab which he currently is in. Rehab plans to release him soon, but sister does not feel he is ready as he is still ambulating with walker and he would live upstairs when he goes home.   3. Rectal Pain, secondary to #1 -onset in mid August. -Previously on Tramadol, now on Hydrocodone. He also uses lidocaine/Anusolsuppositories. -He can use Colase stool softener which is gentler. I also recommend Sitz bath.  -S/p treatment he still has loose and fragmented BM 3 times a day and no blood present.   4. Mild cognitive impairment, Social support -[facility] by [employer] [facility] -He is a reliable historian, independent with ADLs, a/o x4, no signs of obvious impairment -Will monitor for  neurotoxicities on chemo, especially oxaliplatin -He has very good family support from his 2 sisters.   PLAN: -port Flush today  -Left knee Xray today  -Request Dr WMorton Stallnote from 01/05/20  -[facility] near future per Dr. WDonnella Biplan  -Proceed with surgery on 02/10/20 -Lab, flush, f/u in 2 months    No problem-specific Assessment & Plan notes found for this encounter.   No orders of the defined types were placed in this encounter.  All questions were answered. The patient knows to call the clinic with any problems, questions or concerns. No barriers to learning was detected.  The total time spent in the appointment was 30 minutes.     Truitt Merle, MD 01/06/2020   I, Joslyn Devon, am acting as scribe for Truitt Merle, MD.   I have reviewed the above documentation for accuracy and completeness, and I agree with the above.

## 2020-01-04 NOTE — Telephone Encounter (Signed)
  Radiation Oncology         [phone] ________________________________  Name: Carlos [name] MRN: [id number]  Date of Service: 01/04/2020  DOB: [birth year]  Post Treatment Telephone Note  Diagnosis:  Stage III, [id number] adenocarcinoma of the rectum.   Interval Since Last Radiation:  4 weeks   11/02/19-12/08/19: The rectum and regional nodes were treated to 45 Gy in 25 fractions. He completed 3.6 Gy over 2 fractions of the expected 5.4 Gy boost in 3 fractions due to his hospitalization.   Narrative:  The patient was contacted today for routine follow-up. During treatment he did very well with radiotherapy and did not have significant desquamation. He did have expected increase in stool and bladder activity. At the end of his treatment, he developed a seizure that led to hospital evaluation and subsequently fell during that evaluation and fractured his left femur. He had a prolonged hospital course as a result of surgical pinning, and was just discharged to a skilled facility last week.   Impression/Plan: 1. Stage III, NG2X5M8 adenocarcinoma of the rectum.  I was unable the reach the patient's sister Carlos [name] today but on a voicemail, discussed that we would be happy to continue to follow him as needed, but he will also continue to follow up with Dr. Burr Medico in medical oncology and with Dr. Morton Stall in [place] at [facility]. 2. History of seizure disorder. He will continue under the care of his neurologist for further management. 3. Left femur fracture. He was discharged to a skilled facility for further rehab on 12/30/19.    Carola Rhine, PAC

## 2020-01-04 NOTE — Telephone Encounter (Signed)
I spoke with the patient's sister [name] and he is doing fairly well at the [facility] in [place], [place]. She will follow up with Dr. Donnella Bi [facility] to see if his appt on 12/9 needs to be rescheduled or not. We will be available to see him as needed.

## 2020-01-06 ENCOUNTER — Other Ambulatory Visit: Payer: Self-pay

## 2020-01-06 ENCOUNTER — Ambulatory Visit (HOSPITAL_COMMUNITY)
Admission: RE | Admit: 2020-01-06 | Discharge: 2020-01-06 | Disposition: A | Discharge status: Home / Self Care | Payer: 59 | Source: Ambulatory Visit | Attending: Hematology | Admitting: Hematology

## 2020-01-06 ENCOUNTER — Encounter: Payer: Self-pay | Admitting: Hematology

## 2020-01-06 ENCOUNTER — Inpatient Hospital Stay: Payer: 59

## 2020-01-06 ENCOUNTER — Other Ambulatory Visit: Payer: Self-pay | Admitting: Hematology

## 2020-01-06 ENCOUNTER — Inpatient Hospital Stay: Payer: 59 | Attending: Nurse Practitioner | Admitting: Hematology

## 2020-01-06 VITALS — BP 128/85 | HR 91 | Temp 97.0°F | Resp 19 | Ht 67.0 in | Wt 128.0 lb

## 2020-01-06 DIAGNOSIS — Z79899 Other long term (current) drug therapy: Secondary | ICD-10-CM | POA: Diagnosis not present

## 2020-01-06 DIAGNOSIS — I700 Atherosclerosis of aorta: Secondary | ICD-10-CM | POA: Diagnosis not present

## 2020-01-06 DIAGNOSIS — Z8781 Personal history of (healed) traumatic fracture: Secondary | ICD-10-CM

## 2020-01-06 DIAGNOSIS — G3184 Mild cognitive impairment, so stated: Secondary | ICD-10-CM | POA: Insufficient documentation

## 2020-01-06 DIAGNOSIS — Z9221 Personal history of antineoplastic chemotherapy: Secondary | ICD-10-CM | POA: Insufficient documentation

## 2020-01-06 DIAGNOSIS — M25562 Pain in left knee: Secondary | ICD-10-CM | POA: Insufficient documentation

## 2020-01-06 DIAGNOSIS — G893 Neoplasm related pain (acute) (chronic): Secondary | ICD-10-CM | POA: Diagnosis not present

## 2020-01-06 DIAGNOSIS — I2510 Atherosclerotic heart disease of native coronary artery without angina pectoris: Secondary | ICD-10-CM | POA: Insufficient documentation

## 2020-01-06 DIAGNOSIS — C20 Malignant neoplasm of rectum: Secondary | ICD-10-CM

## 2020-01-06 DIAGNOSIS — Z923 Personal history of irradiation: Secondary | ICD-10-CM | POA: Diagnosis not present

## 2020-01-06 DIAGNOSIS — R269 Unspecified abnormalities of gait and mobility: Secondary | ICD-10-CM | POA: Insufficient documentation

## 2020-01-06 LAB — CMP (CANCER CENTER ONLY)
ALT: 20 U/L (ref 0–44)
AST: 21 U/L (ref 15–41)
Albumin: 3.9 g/dL (ref 3.5–5.0)
Alkaline Phosphatase: 178 U/L — ABNORMAL HIGH (ref 38–126)
Anion gap: 8 (ref 5–15)
BUN: 9 mg/dL (ref 8–23)
CO2: 28 mmol/L (ref 22–32)
Calcium: 9.7 mg/dL (ref 8.9–10.3)
Chloride: 105 mmol/L (ref 98–111)
Creatinine: 0.78 mg/dL (ref 0.61–1.24)
GFR, Estimated: >60 mL/min (ref >60)
Glucose, Bld: 93 mg/dL (ref 70–99)
Potassium: 4.2 mmol/L (ref 3.5–5.1)
Sodium: 141 mmol/L (ref 135–145)
Total Bilirubin: 0.5 mg/dL (ref 0.3–1.2)
Total Protein: 7 g/dL (ref 6.5–8.1)

## 2020-01-06 LAB — CBC WITH DIFFERENTIAL (CANCER CENTER ONLY)
Abs Immature Granulocytes: 0.01 10*3/uL (ref 0.00–0.07)
Basophils Absolute: 0 10*3/uL (ref 0.0–0.1)
Basophils Relative: 0 %
Eosinophils Absolute: 0.1 10*3/uL (ref 0.0–0.5)
Eosinophils Relative: 2 %
HCT: 36.7 % — ABNORMAL LOW (ref 39.0–52.0)
Hemoglobin: 12 g/dL — ABNORMAL LOW (ref 13.0–17.0)
Immature Granulocytes: 0 %
Lymphocytes Relative: 38 %
Lymphs Abs: 1.8 10*3/uL (ref 0.7–4.0)
MCH: 30.5 pg (ref 26.0–34.0)
MCHC: 32.7 g/dL (ref 30.0–36.0)
MCV: 93.1 fL (ref 80.0–100.0)
Monocytes Absolute: 0.5 10*3/uL (ref 0.1–1.0)
Monocytes Relative: 12 %
Neutro Abs: 2.2 10*3/uL (ref 1.7–7.7)
Neutrophils Relative %: 48 %
Platelet Count: 212 10*3/uL (ref 150–400)
RBC: 3.94 MIL/uL — ABNORMAL LOW (ref 4.22–5.81)
RDW: 14.2 % (ref 11.5–15.5)
WBC Count: 4.7 10*3/uL (ref 4.0–10.5)
nRBC: 0 % (ref 0.0–0.2)

## 2020-01-06 IMAGING — DX DG KNEE COMPLETE 4+V*L*
4 series · 4 of 4 positions shown · non-contrast
Comparison: [DATE].

CLINICAL DATA: Pain following fall

EXAM:
LEFT KNEE - COMPLETE 4+ VIEW

[knee ap]
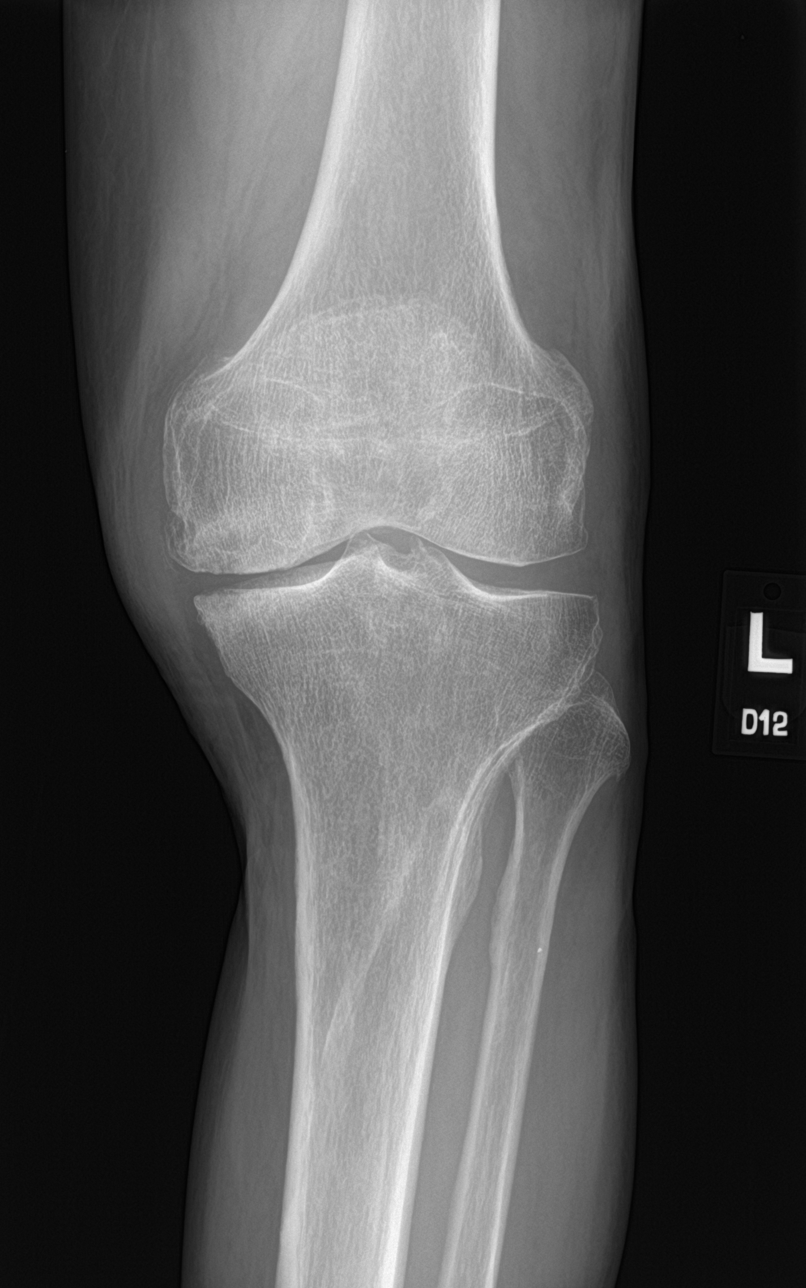

[knee lat]
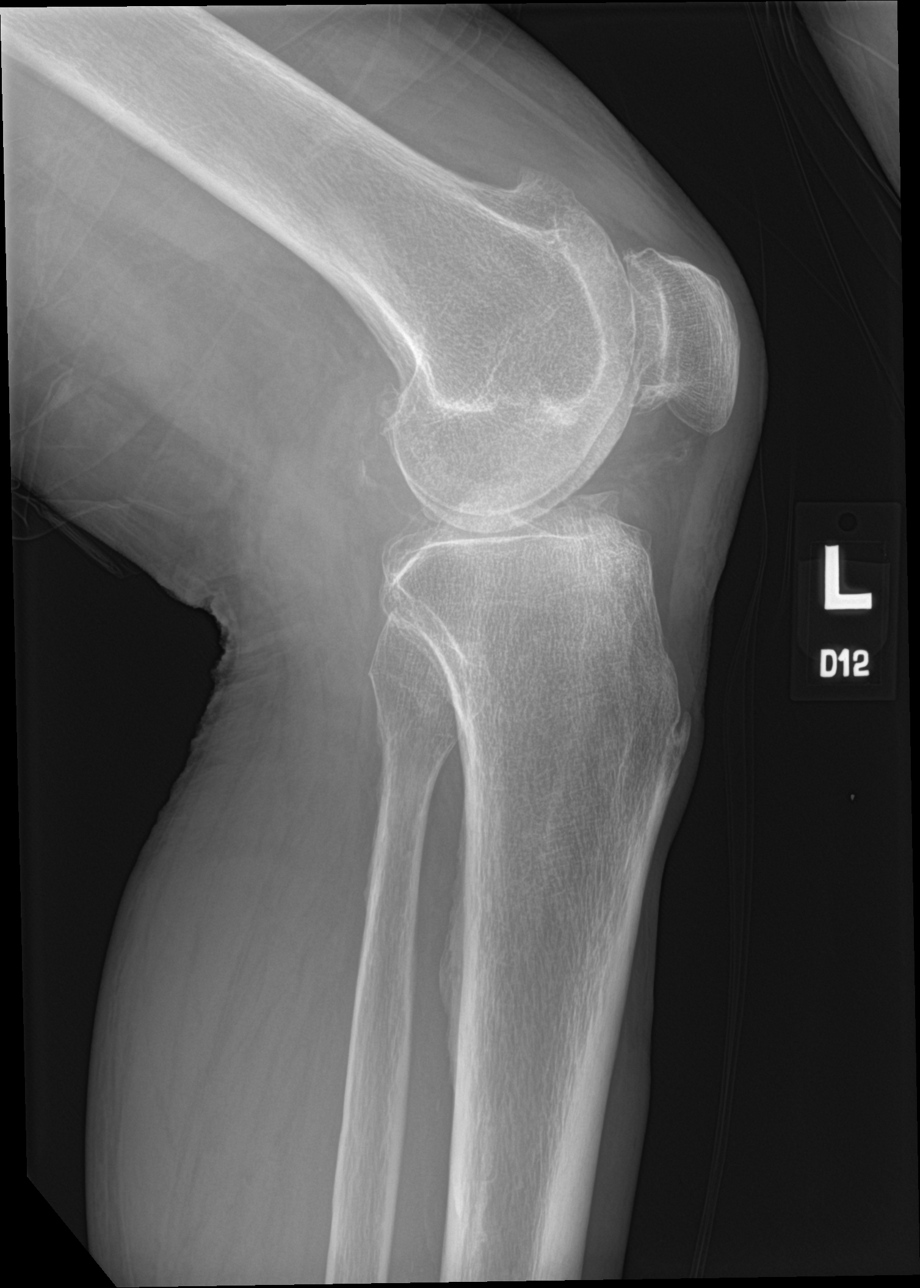

[knee obl (1 of 2)]
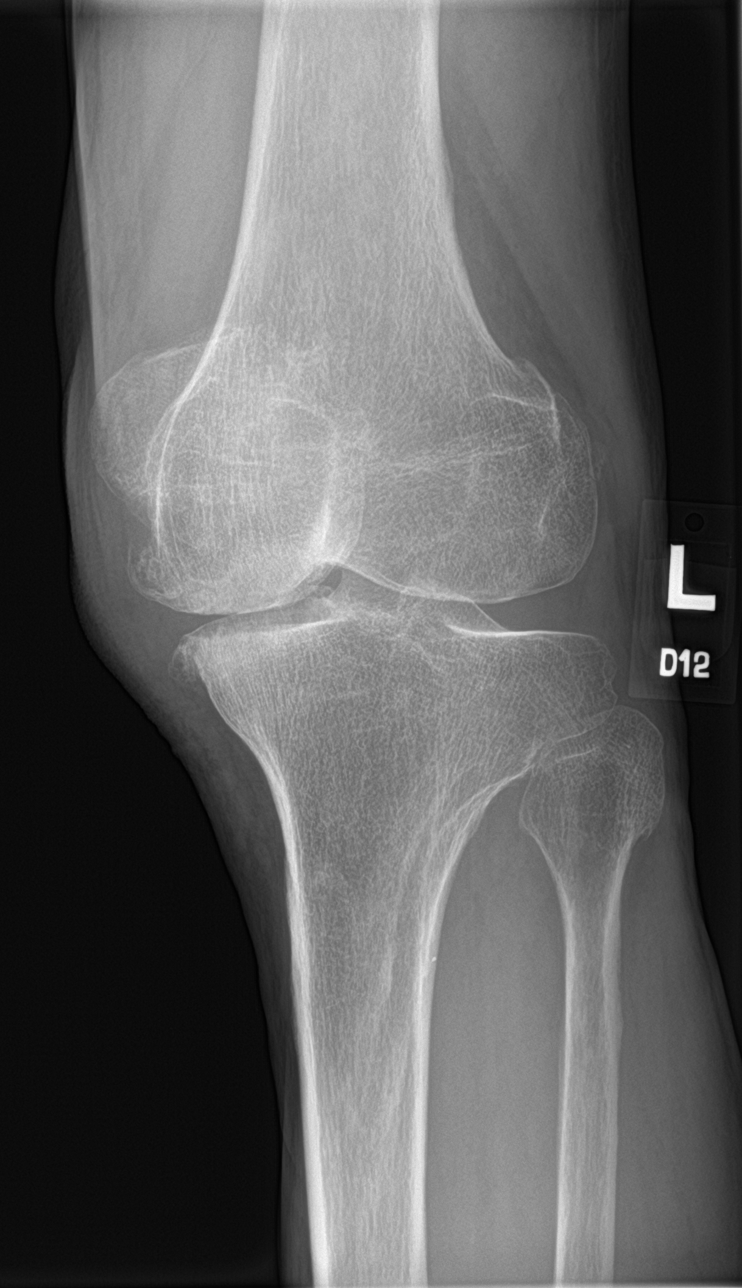

[knee obl (2 of 2)]
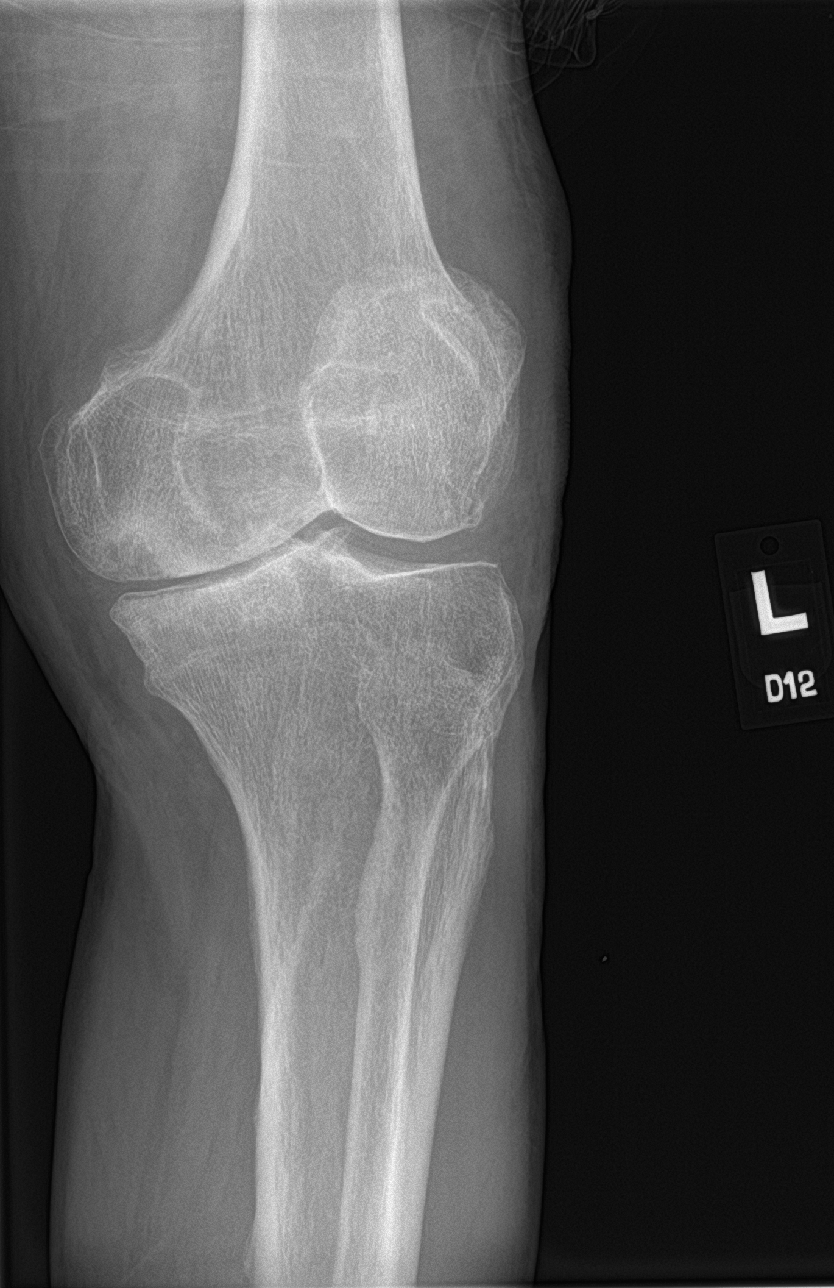

[4 of 4 positions shown; findings below may reference images not displayed]

FINDINGS: Frontal, lateral, and bilateral oblique views were obtained. There
is no evident acute fracture or dislocation. No joint effusion.
There is moderately severe joint space narrowing medially and in the
patellofemoral joint regions. There is spurring along the posterior
patella as well as medially. No evident erosion.
IMPRESSION: Osteoarthritic change medially and in the patellofemoral joint
regions. No acute fracture or dislocation. No evident joint
effusion.

## 2020-01-07 ENCOUNTER — Telehealth: Payer: Self-pay | Admitting: Hematology

## 2020-01-07 NOTE — Telephone Encounter (Signed)
Scheduled appts per 12/1 los. Left voicemail with appt date and time

## 2020-01-11 ENCOUNTER — Telehealth: Payer: Self-pay

## 2020-01-11 ENCOUNTER — Telehealth: Payer: Self-pay | Admitting: Radiation Oncology

## 2020-01-11 NOTE — Telephone Encounter (Signed)
I spoke with the patients sister to return her call about questions she had regarding getting back on board with surgical plans with Dr. Morton Stall. She's trying to get back in touch with orthopedics to maximize his progress. And his digestive habits have improved.

## 2020-01-11 NOTE — Telephone Encounter (Signed)
OV note and labs from 01/06/2020 faxed to Dr Morton Stall at [phone]

## 2020-01-13 ENCOUNTER — Telehealth: Payer: Self-pay

## 2020-01-13 NOTE — Telephone Encounter (Signed)
2nd request for copy of Dr Donnella Bi ov notes from 01/05/2020 faxed to [phone].  I also called [phone] and left a message requesting note.

## 2020-01-21 DIAGNOSIS — R296 Repeated falls: Secondary | ICD-10-CM | POA: Insufficient documentation

## 2020-01-21 DIAGNOSIS — M549 Dorsalgia, unspecified: Secondary | ICD-10-CM

## 2020-01-21 HISTORY — DX: Repeated falls: R29.6

## 2020-01-21 HISTORY — DX: Dorsalgia, unspecified: M54.9

## 2020-01-22 ENCOUNTER — Other Ambulatory Visit: Payer: Self-pay | Admitting: Hematology

## 2020-01-22 DIAGNOSIS — C20 Malignant neoplasm of rectum: Secondary | ICD-10-CM

## 2020-01-26 ENCOUNTER — Other Ambulatory Visit: Payer: Self-pay

## 2020-01-26 ENCOUNTER — Telehealth: Payer: Self-pay | Admitting: Neurology

## 2020-01-26 MED ORDER — LEVETIRACETAM 250 MG PO TABS: 1250.0000 mg | ORAL_TABLET | Freq: Two times a day (BID) | ORAL | 2 refills | Status: DC

## 2020-01-26 NOTE — Telephone Encounter (Signed)
Ok to send refills until his f/u, thanks 

## 2020-01-26 NOTE — Telephone Encounter (Signed)
Refill sent in

## 2020-01-26 NOTE — Telephone Encounter (Signed)
Pt was at [facility] and they changed the dosage of the keppra and his sister wants to have a refill called into the [facility] on [address] for the new dosage

## 2020-01-29 ENCOUNTER — Encounter (HOSPITAL_COMMUNITY): Payer: Self-pay

## 2020-01-29 ENCOUNTER — Ambulatory Visit (HOSPITAL_COMMUNITY)
Admission: RE | Admit: 2020-01-29 | Discharge: 2020-01-29 | Disposition: A | Discharge status: Home / Self Care | Payer: 59 | Source: Ambulatory Visit | Attending: Hematology | Admitting: Hematology

## 2020-01-29 ENCOUNTER — Other Ambulatory Visit: Payer: Self-pay

## 2020-01-29 DIAGNOSIS — C20 Malignant neoplasm of rectum: Secondary | ICD-10-CM | POA: Diagnosis present

## 2020-01-29 IMAGING — CT CT CHEST-ABD-PELV W/ CM
3 of 5 series · 14 of 36 positions shown, 16 images · IV contrast (APPLIED)
Comparison: [DATE] abdominopelvic CT. PET [DATE]. Most
recent chest CT [DATE].

CLINICAL DATA: Rectal cancer. Chemotherapy and radiation therapy
complete. Preop for surgery on [DATE].

EXAM:
CT CHEST, ABDOMEN, AND PELVIS WITH CONTRAST
TECHNIQUE: Multidetector CT imaging of the chest, abdomen and pelvis was
performed following the standard protocol during bolus
administration of intravenous contrast.
CONTRAST:  100mL OMNIPAQUE IOHEXOL 300 MG/ML  SOLN

[Series 2: cap with · axial · 0.80mm/px · z∈[+599,+1069]mm · 9 of 118 slices shown, 11 images]
[im 12/118  mediastinal]
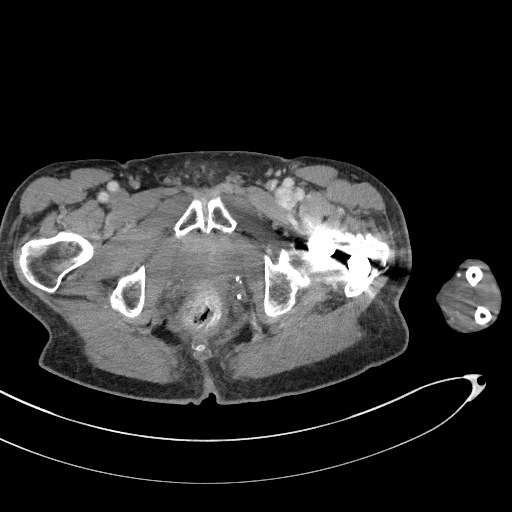
[im 12/118  bone]
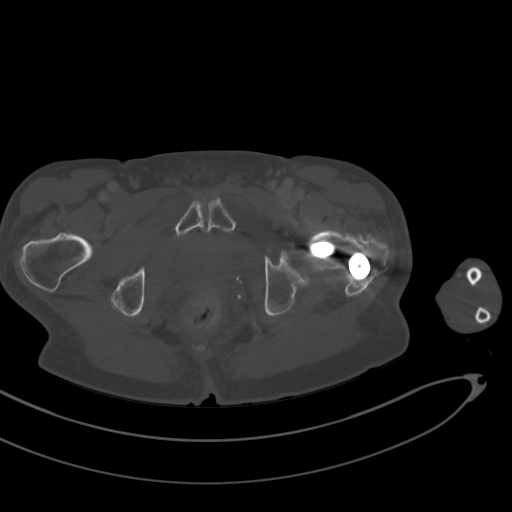
[im 24/118  mediastinal]
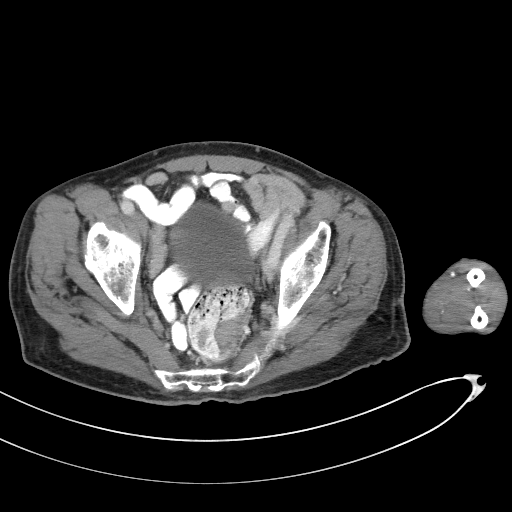
[im 36/118  mediastinal]
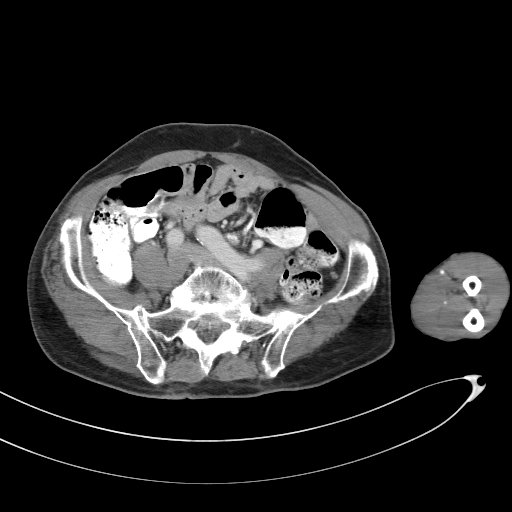
[im 47/118  mediastinal]
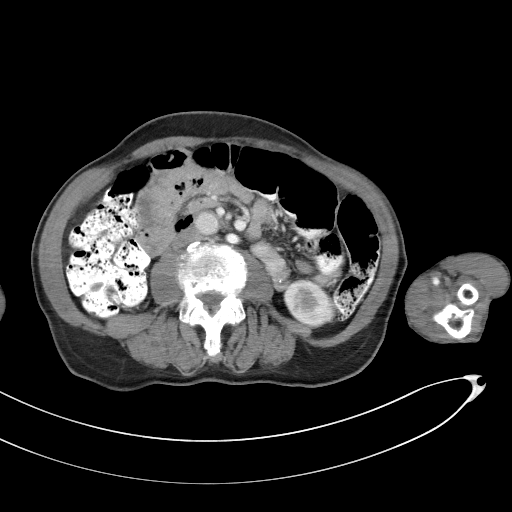
[im 59/118  mediastinal]
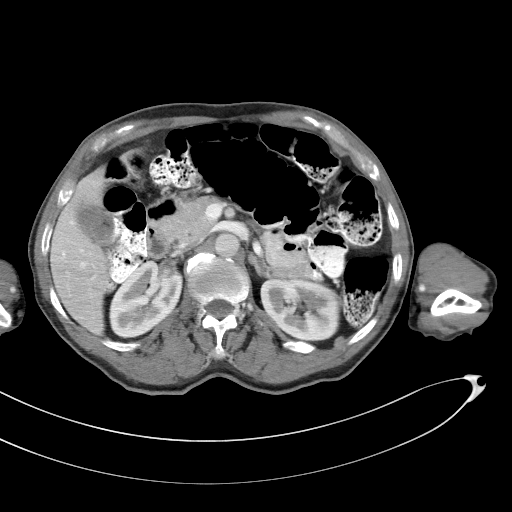
[im 71/118  mediastinal]
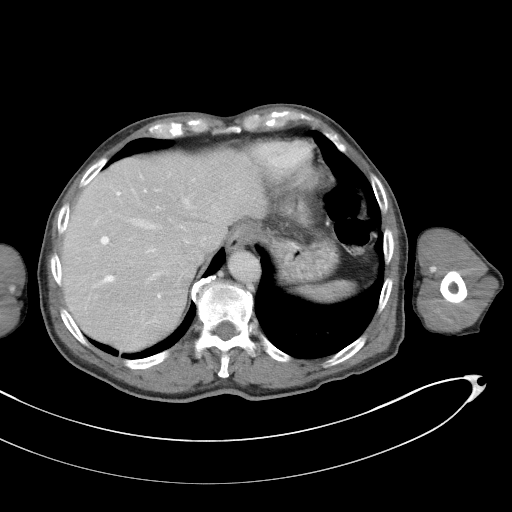
[im 82/118  mediastinal]
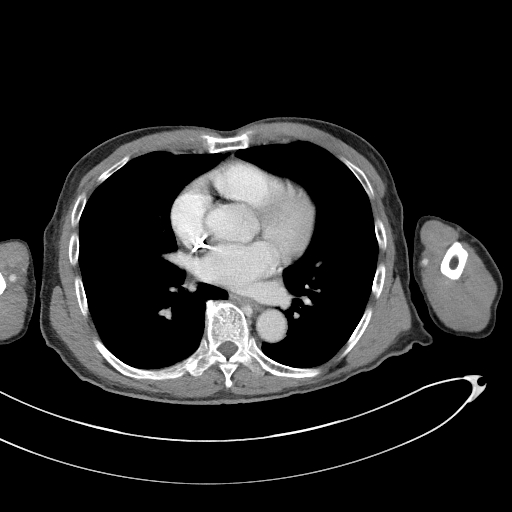
[im 94/118  mediastinal]
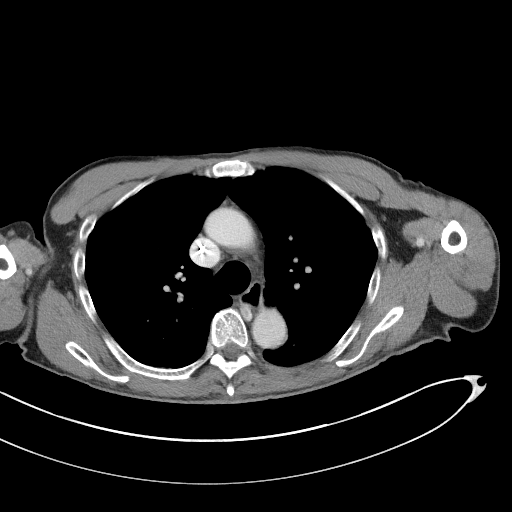
[im 106/118  mediastinal]
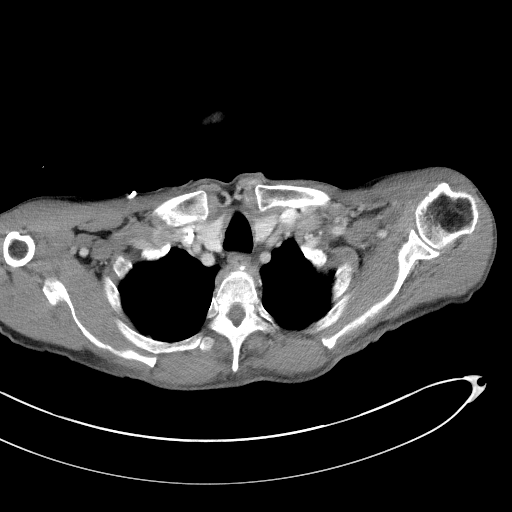
[im 106/118  bone]
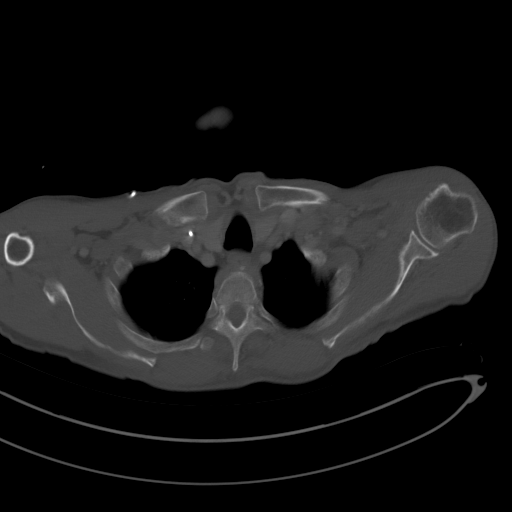

[Series 4: coronals · coronal · 0.83mm/px · 3 of 131 slices shown]
[im 27/131  mediastinal]
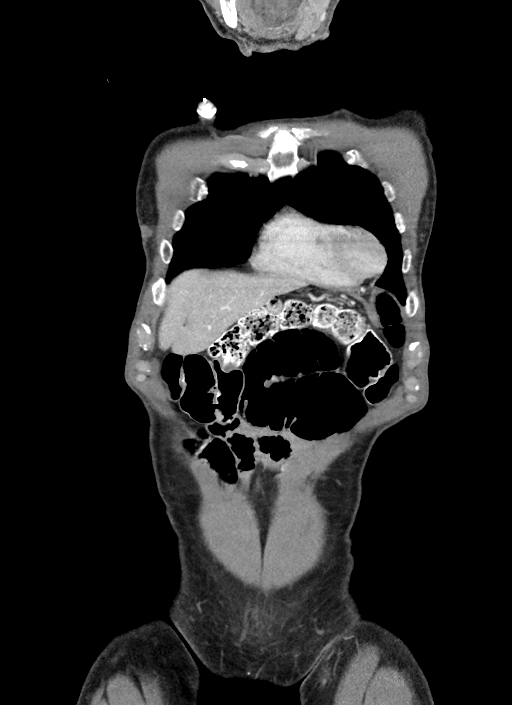
[im 53/131  mediastinal]
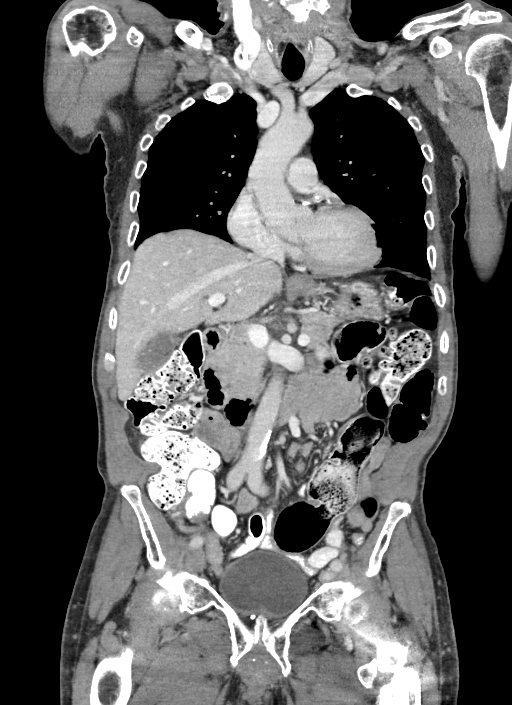
[im 79/131  mediastinal]
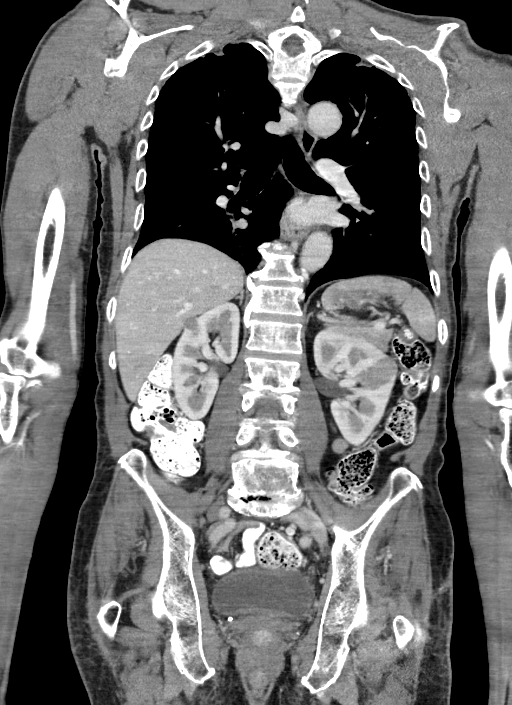

[Series 6: lung · axial · 0.80mm/px · z∈[+875,+917]mm · 2 of 138 slices shown]
[im 11/138  bone]
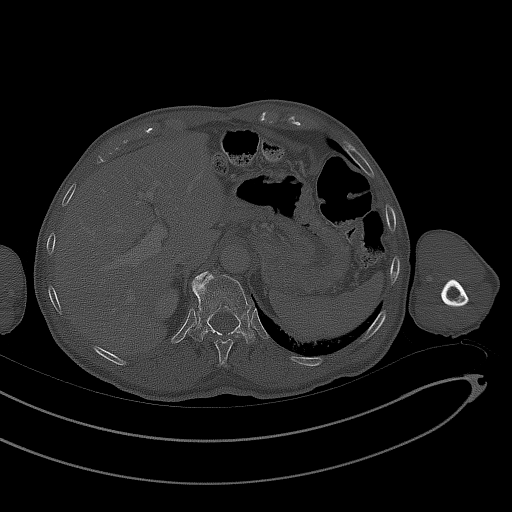
[im 32/138  bone]
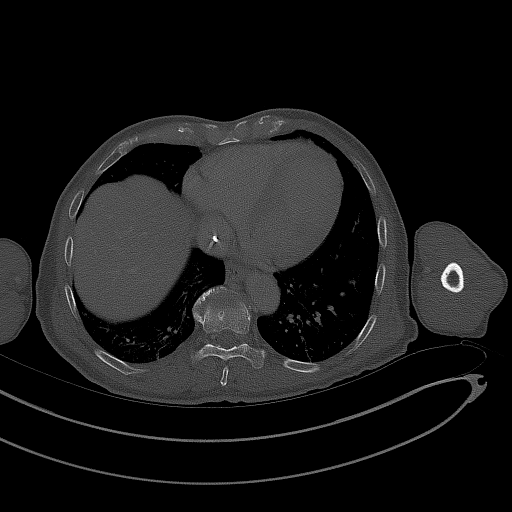

[14 of 36 positions shown; findings below may reference images not displayed]

FINDINGS: CT CHEST FINDINGS

Cardiovascular: Right Port-A-Cath tip terminates in the IVC,
including on 45/2. Aortic atherosclerosis. Normal heart size,
without pericardial effusion. Lad coronary artery calcification. No
central pulmonary embolism, on this non-dedicated study.

Mediastinum/Nodes: No mediastinal or hilar adenopathy.

Lungs/Pleura: No pleural fluid.  Bibasilar scarring.

3 mm right lower lobe pulmonary nodule on 73/6 is similar.

A subpleural more caudal right lower lobe pulmonary nodule on 89/6
is less distinct today, 3 mm.

The inferior right lower lobe 4 mm nodule on 101/6 is also slightly
less distinct today, possibly due to slice selection.

No new, enlarging, or suspicious pulmonary nodules.

Musculoskeletal: No acute osseous abnormality. Mild bilateral
gynecomastia. T5 and T3 compression deformities are similar.

CT ABDOMEN PELVIS FINDINGS

Hepatobiliary: Normal liver. Gallstones up to 2.0 cm. No acute
cholecystitis or biliary duct dilatation.

Pancreas: Normal, without mass or ductal dilatation.

Spleen: Normal in size, without focal abnormality.

Adrenals/Urinary Tract: Normal adrenal glands. Upper pole right
renal 1.0 cm lesion is favored to represent a hemorrhagic cyst, when
correlated with the prior PET.

Medial upper pole right renal lesion demonstrates complexity,
including at 1.9 cm on 58/2.

Lower pole 4 mm left renal collecting system calculus. Bilateral too
small to characterize renal lesions.

Interpolar left renal 2.5 cm lesion is most consistent with a
hemorrhagic/proteinaceous cyst, when correlated with the prior PET.

Normal urinary bladder.

Stomach/Bowel: Normal stomach, without wall thickening.

The left sided rectosigmoid junction primary measures 3.9 x 2.2 cm
on 96/2 versus 6.7 x 2.9 cm on the prior exam. No transmural
extension. No complicating obstruction. Normal terminal ileum and
appendix. Normal small bowel.

Vascular/Lymphatic: Aortic atherosclerosis. No abdominopelvic
adenopathy.

Reproductive: Normal prostate.

Other: No significant free fluid. No evidence of omental or
peritoneal disease.

Musculoskeletal: Left proximal femur fixation. Osteopenia.
Lumbosacral spondylosis.
IMPRESSION: 1. Interval response to therapy of rectosigmoid junction primary.
2. No findings of metastatic disease within the chest, abdomen, or
pelvis.
3. Small pulmonary nodules, similar and decreased as detailed above.
Nonspecific.
4. Right-sided Port-A-Cath tip at high IVC. consider repositioning.
5. Cholelithiasis.
6. Left nephrolithiasis.
7. Bilateral renal lesions. A medial upper pole renal lesion is
suspicious for renal cell carcinoma. This could be re-evaluated at
follow-up or more entirely characterized with routine outpatient pre
and post contrast abdominal MRI.

## 2020-01-29 MED ORDER — HEPARIN SOD (PORK) LOCK FLUSH 100 UNIT/ML IV SOLN
INTRAVENOUS | Status: AC
  Filled 2020-01-29: qty 5

## 2020-01-29 MED ORDER — IOHEXOL 300 MG/ML  SOLN
100.0000 mL | Freq: Once | INTRAMUSCULAR | Status: AC | PRN
  Administered 2020-01-29: 100 mL via INTRAVENOUS

## 2020-01-29 MED ORDER — HEPARIN SOD (PORK) LOCK FLUSH 100 UNIT/ML IV SOLN
500.0000 [IU] | Freq: Once | INTRAVENOUS | Status: AC
  Administered 2020-01-29: 500 [IU] via INTRAVENOUS

## 2020-02-01 ENCOUNTER — Telehealth: Payer: Self-pay | Admitting: Neurology

## 2020-02-01 ENCOUNTER — Encounter: Payer: 59 | Attending: Physical Medicine & Rehabilitation | Admitting: Physical Medicine & Rehabilitation

## 2020-02-01 ENCOUNTER — Other Ambulatory Visit: Payer: Self-pay

## 2020-02-01 ENCOUNTER — Telehealth: Payer: Self-pay

## 2020-02-01 ENCOUNTER — Encounter: Payer: Self-pay | Admitting: Physical Medicine & Rehabilitation

## 2020-02-01 VITALS — BP 129/70 | HR 78 | Temp 98.0°F | Ht 67.0 in | Wt 131.0 lb

## 2020-02-01 DIAGNOSIS — C20 Malignant neoplasm of rectum: Secondary | ICD-10-CM

## 2020-02-01 DIAGNOSIS — R569 Unspecified convulsions: Secondary | ICD-10-CM | POA: Diagnosis present

## 2020-02-01 DIAGNOSIS — R269 Unspecified abnormalities of gait and mobility: Secondary | ICD-10-CM

## 2020-02-01 DIAGNOSIS — S72145S Nondisplaced intertrochanteric fracture of left femur, sequela: Secondary | ICD-10-CM

## 2020-02-01 HISTORY — DX: Unspecified abnormalities of gait and mobility: R26.9

## 2020-02-01 NOTE — Telephone Encounter (Signed)
No answer, at 406

## 2020-02-01 NOTE — Telephone Encounter (Signed)
Patient's sister Flora Lipps called and said the patient has been taking Levetiracetam 1250 MG twice a day since being in a facility for rehab after a long hospital stay. He was released on 01/28/20 and is home now.  However, last night she reports he forgot to take the seizure medication. She'd like to speak with a nurse about this.

## 2020-02-01 NOTE — Telephone Encounter (Signed)
Patient's sister called back and requested a call from a nurse. She has questions about the Keppra directions.

## 2020-02-01 NOTE — Telephone Encounter (Signed)
Advised to contact pharmacy for new rx since he just got out of Rehab until follow up with Dr. Karel Jarvis.

## 2020-02-01 NOTE — Telephone Encounter (Signed)
Open in error

## 2020-02-01 NOTE — Progress Notes (Addendum)
Subjective:    Patient ID: Carlos [name], male    DOB: 1945/11/27, 74 y.o.   MRN: [id number]  HPI Right-handed male with history of epilepsy maintained on Keppra, rectal adenocarcinoma maintained on Xeloda, radiation oncology Dr. Lisbeth [name], BPH anxiety, mild cognitive disorder presents for follow up for left intertrochanteric femur fracture status [name] intramedullary fixation 12/11/2019.    Admit date: 12/15/2019 Discharge date: 12/30/2019  Sister provides history. At discharge, pt was instructed. He has not followed up with Neuro. He saw Oncology. He saw Rad/Onc. He saw Ortho. He is planning on having resection next week. Sacral ulcer has healed. Pain is relatively controlled.  Denies seizures. Denies falls. GI is stable. He had xray of left knee, showing patellofemoral OA. He is at home now from SNF.  Therapies: to begin today Mobility: Walker in community, cane at home DME: Bedside commode  Pain Inventory Average Pain 2 Pain Right Now 0 My pain is intermittent and dull  In the last 24 hours, has pain interfered with the following? General activity 0 Relation with others 0 Enjoyment of life 0 What TIME of day is your pain at its worst? varies Sleep (in general) Fair  Pain is worse with: walking Pain improves with: medication Relief from Meds: 5  use a cane use a walker how many minutes [name] you walk? 10 ability to climb steps?  yes do you drive?  no  employed # of hrs/week 40 not employed: date last employed 2021  weakness trouble walking  HFU  HFU    Family History  Problem Relation Age of Onset  . Osteoporosis Mother   . Dementia Mother   . Diabetes Mother   . Brain cancer Father    Social History   Socioeconomic History  . Marital status: Single    Spouse name: Not on file  . Number of children: 0  . Years of education: 16  . Highest education level: Bachelor's degree (e.g., BA, AB, BS)  Occupational History  . Not on file  Tobacco Use  . Smoking  status: Never Smoker  . Smokeless tobacco: Never Used  Vaping Use  . Vaping Use: Never used  Substance and Sexual Activity  . Alcohol use: No  . Drug use: No  . Sexual activity: Not Currently  Other Topics Concern  . Not on file  Social History Narrative   4 year College- history major      Pts mother lives with him   Right handed   Two story home   Drinks tea   Used to live in [place]   Social Determinants of Radio broadcast assistant Strain: Not on file  Food Insecurity: Not on file  Transportation Needs: Not on file  Physical Activity: Not on file  Stress: Not on file  Social Connections: Not on file   Past Surgical History:  Procedure Laterality Date  . INTRAMEDULLARY (IM) NAIL INTERTROCHANTERIC Left 12/11/2019   Procedure: INTRAMEDULLARY (IM) NAIL INTERTROCHANTRIC;  Surgeon: Carlos Can, MD;  Location: [facility];  Service: Orthopedics;  Laterality: Left;  . PORTACATH PLACEMENT N/A 07/01/2019   Procedure: PORT ULTRASOUND GUIDED PLACEMENT;  Surgeon: Carlos Overall, MD;  Location: [facility];  Service: General;  Laterality: N/A;  . PROSTATE SURGERY  2004  . TONSILECTOMY, ADENOIDECTOMY, BILATERAL MYRINGOTOMY AND TUBES     Past Medical History:  Diagnosis Date  . Anxiety   . Benign prostatic hyperplasia 03/30/2013   10/1 IMO update  . Mild neurocognitive disorder of unclear etiology  01/23/2019  . rectal ca dx'd 06/2016  . Seizures (HCC)    BP 129/70   Pulse 78   Temp 98 F (36.7 C)   Ht 5\' 7"  (1.702 m)   Wt 131 lb (59.4 kg)   SpO2 98%   BMI 20.52 kg/m   Opioid Risk Score:   Fall Risk Score:  `1  Depression screen PHQ 2/9  Depression screen PHQ 2/9 02/01/2020  Decreased Interest 1  Down, Depressed, Hopeless 0  PHQ - 2 Score 1  Altered sleeping 0  Tired, decreased energy 0  Change in appetite 0  Feeling bad or failure about yourself  0  Trouble concentrating 1  Moving slowly or fidgety/restless 0  Suicidal thoughts 0  PHQ-9 Score 2  Difficult doing  work/chores Not difficult at all    Review of Systems  Gastrointestinal: Positive for blood in stool and diarrhea.  Musculoskeletal: Positive for arthralgias, gait problem and myalgias.       Hip pain Knee pain  All other systems reviewed and are negative.     Objective:   Physical Exam  Constitutional: No distress . Vital signs reviewed. HENT: Normocephalic.  Atraumatic. Eyes: EOMI. No discharge. Cardiovascular: No JVD.   Respiratory: Normal effort.  No stridor.   GI: Non-distended.   Skin: Warm and dry.  Intact. Psych: Slowed. Confused Musc: No edema or tenderness in extremities Gait: Slow cadence  Neuro: Alert Motor: Left lower extremity: Hip flexion 4/5, knee extension 4/5, ankle dorsiflexion 4/5    Assessment & Plan:  Right-handed male with history of epilepsy maintained on Keppra, rectal adenocarcinoma maintained on Xeloda, radiation oncology Dr. 11-08-1973, BPH anxiety, mild cognitive disorder presents for follow up for left intertrochanteric femur fracture status [name] intramedullary fixation 12/11/2019.    1.  Decreased functional mobility secondary to left intertrochanteric femur fracture. Status [name] intramedullary fixation 12/11/2019.    Continue therapies  Follow up with Ortho  2. Pain Management/left knee patellofemoral OA:   Cont meds prn  Voltaren gel recommended  Is following up with Ortho, xrays ordered, reviewed- OA  3.  Seizure disorder.      Cont meds  Follow up with Neuro  4.  Rectal adenocarcinoma.    Continue follow-up oncology service with Dr. 13/06/2019 as well as radiation oncology Dr. Parke [name].    Plan for resection next week  5. Gait abnormality  Cont walker for safety  Initiate therapies  Meds reviewed Referrals reviewed - needs Neuro All questions answered

## 2020-02-02 ENCOUNTER — Telehealth: Payer: Self-pay

## 2020-02-02 DIAGNOSIS — C61 Malignant neoplasm of prostate: Secondary | ICD-10-CM

## 2020-02-02 HISTORY — DX: Malignant neoplasm of prostate: C61

## 2020-02-02 NOTE — Telephone Encounter (Signed)
I reviewed [name] NP's comments with Ms Carlos [name]. I faxed CT CAP report to Dr Byrd Hesselbach office at [phone].  I requested images be pushed to [facility] via [place].

## 2020-02-02 NOTE — Telephone Encounter (Signed)
-----   Message from Pollyann Samples, NP sent at 02/02/2020  9:38 AM EST ----- I reviewed his scan, please let him know he had very good partial response to neoadjuvant chemo, and no evidence of metastatic disease. We will see him back after surgery.   Thanks, Clayborn Heron, NP

## 2020-02-08 DIAGNOSIS — Z20822 Contact with and (suspected) exposure to covid-19: Secondary | ICD-10-CM | POA: Diagnosis present

## 2020-02-08 DIAGNOSIS — C61 Malignant neoplasm of prostate: Secondary | ICD-10-CM | POA: Diagnosis not present

## 2020-02-08 DIAGNOSIS — C20 Malignant neoplasm of rectum: Secondary | ICD-10-CM | POA: Diagnosis present

## 2020-02-08 DIAGNOSIS — G3184 Mild cognitive impairment, so stated: Secondary | ICD-10-CM | POA: Diagnosis not present

## 2020-02-08 DIAGNOSIS — R2689 Other abnormalities of gait and mobility: Secondary | ICD-10-CM | POA: Diagnosis not present

## 2020-02-08 DIAGNOSIS — K219 Gastro-esophageal reflux disease without esophagitis: Secondary | ICD-10-CM | POA: Diagnosis present

## 2020-02-08 DIAGNOSIS — K3189 Other diseases of stomach and duodenum: Secondary | ICD-10-CM | POA: Diagnosis present

## 2020-02-08 DIAGNOSIS — N400 Enlarged prostate without lower urinary tract symptoms: Secondary | ICD-10-CM | POA: Diagnosis present

## 2020-02-08 DIAGNOSIS — E43 Unspecified severe protein-calorie malnutrition: Secondary | ICD-10-CM | POA: Diagnosis present

## 2020-02-08 DIAGNOSIS — M6281 Muscle weakness (generalized): Secondary | ICD-10-CM | POA: Diagnosis not present

## 2020-02-08 DIAGNOSIS — R569 Unspecified convulsions: Secondary | ICD-10-CM | POA: Diagnosis not present

## 2020-02-19 DIAGNOSIS — M6281 Muscle weakness (generalized): Secondary | ICD-10-CM | POA: Diagnosis not present

## 2020-02-19 DIAGNOSIS — G3184 Mild cognitive impairment, so stated: Secondary | ICD-10-CM | POA: Diagnosis not present

## 2020-02-19 DIAGNOSIS — R569 Unspecified convulsions: Secondary | ICD-10-CM | POA: Diagnosis not present

## 2020-02-19 DIAGNOSIS — C61 Malignant neoplasm of prostate: Secondary | ICD-10-CM | POA: Diagnosis not present

## 2020-02-19 DIAGNOSIS — C20 Malignant neoplasm of rectum: Secondary | ICD-10-CM | POA: Diagnosis not present

## 2020-02-19 DIAGNOSIS — R2689 Other abnormalities of gait and mobility: Secondary | ICD-10-CM | POA: Diagnosis not present

## 2020-02-25 ENCOUNTER — Encounter: Payer: 59 | Admitting: Psychology

## 2020-02-26 ENCOUNTER — Telehealth: Payer: Self-pay

## 2020-02-26 NOTE — Telephone Encounter (Signed)
Error

## 2020-02-29 ENCOUNTER — Telehealth: Payer: Self-pay | Admitting: Hematology

## 2020-02-29 NOTE — Telephone Encounter (Signed)
Rescheduled appointments per 1/21 sch msg. Spoke to patient's sister, [name], who is aware of appointments date and times.

## 2020-03-03 ENCOUNTER — Encounter: Payer: 59 | Admitting: Psychology

## 2020-03-04 NOTE — Progress Notes (Signed)
  Radiation Oncology         [phone] ________________________________  Name: Carlos [name] MRN: [id number]  Date: 12/11/2019  DOB: 1945/05/09  End of Treatment Note  Diagnosis:   Rectal cancer    Cancer Staging Rectal adenocarcinoma [facility]) Staging form: Colon and Rectum, AJCC 8th Edition - Clinical stage from 06/12/2019: cT4, cN1 - Unsigned Stage prefix: Initial diagnosis   Indication for treatment:  Curative       Radiation treatment dates:   11/02/2019 through 12/08/2019  Site/dose:    The patient was planned to the pelvis to a dose of 45 Gy at 1.8 Gy per fraction. This was accomplished using a 4 field 3-D conformal technique. The patient then was planned to receive a boost to the tumor and adjacent high-risk regions for an additional 5.4 Gy at 1.8 gray per fraction. This was carried out using a coned-down 4 field approach. The patient's total dose was planned to be  50.4 Gy. Daily AlignRT was used on a daily basis to insure proper patient positioning and localization of critical targets/ structures. The patient received concurrent chemotherapy during the course of radiation treatment.  Narrative: The patient tolerated radiation treatment relatively well.   The patient did however discontinue treatment after 2 boost treatments.  His total dose therefore corresponded to 48.6 Pearline Cables which was delivered in 27 fractions.  Plan: The patient has completed radiation treatment. The patient will return to radiation oncology clinic for routine followup in one month. I advised the patient to call or return sooner if they have any questions or concerns related to their recovery or treatment.   ------------------------------------------------  Jodelle Gross, MD, PhD

## 2020-03-08 ENCOUNTER — Ambulatory Visit: Payer: 59 | Admitting: Neurology

## 2020-03-09 ENCOUNTER — Other Ambulatory Visit: Payer: 59

## 2020-03-09 ENCOUNTER — Ambulatory Visit: Payer: 59 | Admitting: Hematology

## 2020-03-25 ENCOUNTER — Other Ambulatory Visit: Payer: Self-pay

## 2020-03-25 ENCOUNTER — Telehealth: Payer: Self-pay | Admitting: Neurology

## 2020-03-25 MED ORDER — LEVETIRACETAM 250 MG PO TABS: 1250.0000 mg | ORAL_TABLET | Freq: Two times a day (BID) | ORAL | 1 refills | Status: DC

## 2020-03-25 NOTE — Telephone Encounter (Signed)
Patient's sister called in stating the pharmacy does not have the current dosage of 1,250 mg of the patient's Keppra. They are only showing 1,000. They need an updated prescription sent in.

## 2020-03-25 NOTE — Telephone Encounter (Signed)
Ok to fill, can you pls check why she is saying 1000, the med list says 250mg  take 5 tabs twice a day when we sent in December.

## 2020-04-01 NOTE — Progress Notes (Signed)
Carlos [name]   [phone] Fax:(336) [phone]   Clinic Follow up Note   Patient Care Team: Carlos Pepper, MD as PCP - General (Family Medicine) Carlos Sprang, MD as Consulting Physician (Neurology) Carlos Finner, RN as Oncology Nurse Navigator Carlos Merle, MD as Consulting Physician (Hematology) Carlos Juniper, MD as Consulting Physician (Gastroenterology)  Date of Service:  04/06/2020  CHIEF COMPLAINT: F/u rectal cancer  SUMMARY OF ONCOLOGIC HISTORY: Oncology History Overview Note  Cancer Staging Rectal adenocarcinoma [facility]) Staging form: Colon and Rectum, AJCC 8th Edition - Clinical stage from 06/12/2019: cT4, cN1 - Unsigned Stage prefix: Initial diagnosis - Pathologic stage from 02/08/2020: Stage I (ypT1, pN0, cM0) - Signed by Carlos Merle, MD on 04/06/2020 Stage prefix: Post-therapy Total positive nodes: 0 Residual tumor (R): R0 - None    Rectal adenocarcinoma (Carlos [name])  05/29/2019 Procedure   Colonoscopy per Dr. Therisa [name] A digital rectal exam was normal  frond-like fungating infiltrative non-obstructing large mass in the rectum. The mass was partially circumferential, measured 5 cm in length. The colonoscopy also showed 11 sessile polyps in the transverse and asecnding colon and hepatic flexure.    05/29/2019 Initial Biopsy   Pathology on the rectal biopsy showed at least intramucosal adenocarcinoma involving tubulovillous adenoma with high grade dysplasia.   06/05/2019 Imaging   CT CAP IMPRESSION: 1. Cholelithiasis and gallbladder wall thickening with equivocal pericholecystic inflammation. Acute cholecystitis not excluded. If there is clinical suspicion for acute cholecystitis, recommend ultrasound evaluation. 2. Approximately 5 cm irregular mass within the distal sigmoid colon with possible extension through the wall. No definite abnormal adjacent lymph nodes. 3. Mild omental haziness-nonspecific. Metastatic disease not excluded. 4. Indeterminate 3-4 mm  RIGHT pulmonary nodules which could represent metastatic disease. These are too small for PET CT evaluation. Consider CT follow-up. 5. 2.5 cm irregular cystic mass within the RIGHT kidney. Elective MRI recommended for further evaluation. 6. 4 mm nonobstructing LEFT LOWER pole renal calculus. 7. Coronary artery disease. 8. Aortic Atherosclerosis (ICD10-I70.0).   06/12/2019 Imaging   Staging MRI pelvis  FINDINGS: TUMOR LOCATION Tumor distance from Anal Verge/Skin Surface:  11.4 cm Tumor distance to Internal Anal Sphincter: 6.5 cm TUMOR DESCRIPTION Circumferential Extent: 100% Tumor Length: 8.3 cm T - CATEGORY Extension through Muscularis Propria: Yes>64mm=T3d Shortest Distance of any tumor/node from Mesorectal Fascia: 0 mm, along the left lateral and posterior walls (tumor abuts the left lateral pelvic sidewall and sacrum) Extramural Vascular Invasion/Tumor Thrombus: No Invasion of Anterior Peritoneal Reflection: No Involvement of Adjacent Organs or Pelvic Sidewall: Yes, involving the left lateral pelvic sidewall =T4 Levator Ani Involvement: No N - CATEGORY Mesorectal Lymph Nodes >=92mm: 2=N1 Extra-mesorectal Lymphadenopathy: No Other:  None. IMPRESSION: Rectal adenocarcinoma T stage: T4 Rectal adenocarcinoma N stage:  N1 Distance from tumor to the internal anal sphincter is 6.5 cm.   06/18/2019 Initial Diagnosis   Rectal adenocarcinoma (Carlos [name])   07/02/2019 - 10/08/2019 Chemotherapy   Total Neoadjuvant FOLFOX q2weeks starting 07/02/19-10/08/19,  8 cycles   07/13/2019 PET scan   IMPRESSION: 1. Known rectal mass is intensely hypermetabolic compatible with primary rectal adenocarcinoma. No findings of FDG avid nodal metastasis or distant metastatic disease. 2. Hyperdense kidney cysts are identified bilaterally favored to represent hemorrhagic cyst. 3. Nonobstructing left renal calculi. 4. Aortic atherosclerosis, in addition to lad coronary artery disease. Please note that although  the presence of coronary artery calcium documents the presence of coronary artery disease, the severity of this disease and any potential stenosis cannot be assessed on this  non-gated CT examination. Assessment for potential risk factor modification, dietary therapy or pharmacologic therapy may be warranted, if clinically indicated. Aortic Atherosclerosis (ICD10-I70.0). 5. Gallstones.   09/11/2019 - 09/14/2019 Hospital Admission   Acute AMS, seizure 09/11/19 -Developed acute altered mental status day after Cycle 6 chemo (09/11/19).  -He was admitted for work up, EEG showed epileptic spike. ID work up negative, MRI negative for metastasis.    09/11/2019 Imaging   MRI Brain  IMPRESSION: Incomplete study. Images obtained reveal no acute abnormality. Negative for acute infarct.   Atrophy and mild ventricular enlargement stable from prior studies.   09/11/2019 Imaging   CT head  IMPRESSION: Mild ventricular prominence with normal appearing sulci. Question early communicating hydrocephalus. Brain parenchyma appears unremarkable. No acute infarct. No mass or hemorrhage.   Foci of arterial vascular calcification noted. There is mucosal thickening in several ethmoid air cells.   There is probable cerumen in the right external auditory canal.     09/23/2019 Imaging   CT AP  IMPRESSION: 1. Solid-appearing left eccentric upper rectal mass measures about 6.7 by 2.9 cm. This is difficult to compare directly to the prior exam given differences in orientation and degree of distension of the rectum. There is some prominence of stool in the sigmoid colon and rectum on today's examination, but the mass is not obstructive. 2. Other imaging findings of potential clinical significance: Cholelithiasis. Complex bilateral renal lesions are similar to prior exams. Nonobstructive left nephrolithiasis. Mild prostatomegaly with nodular prominence the upper margin of the prostate gland. Lumbar spondylosis and  degenerative disc disease causing multilevel foraminal impingement. 3. Aortic atherosclerosis.   Aortic Atherosclerosis (ICD10-I70.0).   11/02/2019 - 12/09/2019 Chemotherapy   Concurrent chemo RT with Oral Xeloda 1500mg  BID on days of RT starting 11/02/19-12/09/19   11/02/2019 - 12/09/2019 Radiation Therapy   Concurrent chemo RT by Dr Lisbeth Renshaw with Xeloda  starting 11/02/19-12/09/19   01/13/2020 Imaging   MRI at [facility] 1.  Reduced size of the previously demonstrated high rectal mass. The residual tumor measures 2.4 cm in maximal diameter and is primarily endoluminal. There is a similar amount of extramural disease spread into the left mesorectal space and presacral space.  2.  Improved appearance of lymphadenopathy with several conspicuous mesorectal and internal iliac lymph nodes.  3.  Small volume of nonspecific free fluid in the pelvis.   01/29/2020 Imaging   CT CAP  IMPRESSION: 1. Interval response to therapy of rectosigmoid junction primary. 2. No findings of metastatic disease within the chest, abdomen, or pelvis. 3. Small pulmonary nodules, similar and decreased as detailed above. Nonspecific. 4. Right-sided Port-A-Cath tip at high IVC. consider repositioning. 5. Cholelithiasis. 6. Left nephrolithiasis. 7. Bilateral renal lesions. A medial upper pole renal lesion is suspicious for renal cell carcinoma. This could be re-evaluated at follow-up or more entirely characterized with routine outpatient pre and post contrast abdominal MRI.   02/08/2020 Surgery   LOWER ANTERIOR RESECTION RECTUM LAPAROSCOPIC by Dr Morton Stall at [facility]   Procedures   PR LAP,SURG,COLECTOMY, PARTIAL, W/ANAST   PR LAP, SURG MOBIL SPLENIC FL DUR PTL COLECTOMY   PR LAP, SURG ILEO/JEJUNO-STOMY   SIGMOID COLECTOMY LAPAROSCOPIC ASSISTED     02/08/2020 Pathology Results   Final Pathologic Diagnosis at [place], "IMA", EXCISION:              Two lymph nodes are negative for metastatic carcinoma  (0/2).   B.  SIGMOID COLON AND RECTUM, LOWER ANTERIOR  RESECTION: Residual well differentiated invasive adenocarcinoma of the rectum, arising from a tubulovillous adenoma with high grade dyplasia.               Size: 2 mm in greatest dimension.  Tumor invades the submucosa.  Negative for perineural and lymphovascular invasion. Resection margins are negative for malignancy or dysplasia.  Twenty-two lymph nodes are negative for metastatic carcinoma (0/22). Complete mesorectum.               See CAP cancer protocol summary and comment.   C.  PROXIMAL DOUGHNUT, RESECTION:                      Segment of benign colonic tissue.              Negative for malignancy or dyplasia.    D.  DISTAL DOUGHNUT, RESECTION:                            Segment of benign colonic tissue.              Negative for malignancy or dyplasia.    PATHOLOGIC STAGE CLASSIFICATION (pTNM, AJCC 8th Edition)         TNM Descriptors:    y (post-treatment)     Primary Tumor (pT):    pT1     Regional Lymph Nodes (pN):    pN0      02/08/2020 Cancer Staging   Staging form: Colon and Rectum, AJCC 8th Edition - Pathologic stage from 02/08/2020: Stage I (ypT1, pN0, cM0) - Signed by Carlos Merle, MD on 04/06/2020 Stage prefix: Post-therapy Total positive nodes: 0 Residual tumor (R): R0 - None      CURRENT THERAPY:  Colon cancer Surveillance  PENDING ostomy takedown surgery in April  INTERVAL HISTORY:  Carlos [name] is here for a follow up. He presents to the clinic alone. He notes he might proceed with ostomy reversal in April. He notes he is recovering well from his 02/08/20 surgery. He notes occasional soreness at surgical site. He denies fatigue. After surgery he went to Nursing home in [place] for 3 weeks and came home in [facility] February. He notes his stool has been thick like mud. He notes he is able to ambulate better even independently but notes stiffness in his knees and arms.    REVIEW OF SYSTEMS:    Constitutional: Denies fevers, chills or abnormal weight loss Eyes: Denies blurriness of vision Ears, nose, mouth, throat, and face: Denies mucositis or sore throat Respiratory: Denies cough, dyspnea or wheezes Cardiovascular: Denies palpitation, chest discomfort or lower extremity swelling Gastrointestinal:  Denies nausea, heartburn or change in bowel habits Skin: Denies abnormal skin rashes Lymphatics: Denies new lymphadenopathy or easy bruising Neurological:Denies numbness, tingling or new weaknesses Behavioral/Psych: Mood is stable, no new changes  All other systems were reviewed with the patient and are negative.  MEDICAL HISTORY:  Past Medical History:  Diagnosis Date  . Anxiety   . Benign prostatic hyperplasia 03/30/2013   10/1 IMO update  . 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 rectal ca dx'd 06/2016  . Seizures ([place])     SURGICAL HISTORY: Past Surgical History:  Procedure Laterality Date  . INTRAMEDULLARY (IM) NAIL INTERTROCHANTERIC Left 12/11/2019   Procedure: INTRAMEDULLARY (IM) NAIL INTERTROCHANTRIC;  Surgeon: Rod Can, MD;  Location: [facility];  Service: Orthopedics;  Laterality: Left;  . PORTACATH PLACEMENT N/A 07/01/2019  Procedure: PORT ULTRASOUND GUIDED PLACEMENT;  Surgeon: Alphonsa Overall, MD;  Location: [facility];  Service: General;  Laterality: N/A;  . PROSTATE SURGERY  2004  . TONSILECTOMY, ADENOIDECTOMY, BILATERAL MYRINGOTOMY AND TUBES      I have reviewed the social history and family history with the patient and they are unchanged from previous note.  ALLERGIES:  has No Known Allergies.  MEDICATIONS:  Current Outpatient Medications  Medication Sig Dispense Refill  . HYDROcodone-acetaminophen (NORCO/VICODIN) 5-325 MG tablet Take 1 tablet by mouth every 4 (four) hours as needed for moderate pain. 5 tablet 0  . levETIRAcetam (KEPPRA) 250 MG tablet Take 5 tablets (1,250 mg total) by mouth 2 (two) times daily. 300 tablet 1  .  loperamide (IMODIUM) 2 MG capsule Take 1 capsule (2 mg total) by mouth every 6 (six) hours as needed for diarrhea or loose stools. 30 capsule 0  . melatonin 3 MG TABS tablet Take 1 tablet (3 mg total) by mouth at bedtime as needed (insomnia). (Patient not taking: Reported on 02/01/2020)  0  . methocarbamol (ROBAXIN) 500 MG tablet Take 1 tablet (500 mg total) by mouth every 6 (six) hours as needed for muscle spasms. (Patient not taking: Reported on 02/01/2020)    . Multiple Vitamin (MULTIVITAMIN WITH MINERALS) TABS tablet Take 1 tablet by mouth daily.    . pantoprazole (PROTONIX) 40 MG tablet Take 1 tablet (40 mg total) by mouth daily.    . polycarbophil (FIBERCON) 625 MG tablet Take 2 tablets (1,250 mg total) by mouth daily.    Marland Kitchen saccharomyces boulardii (FLORASTOR) 250 MG capsule Take 1 capsule (250 mg total) by mouth 2 (two) times daily. (Patient not taking: Reported on 02/01/2020)    . tamsulosin (FLOMAX) 0.4 MG CAPS capsule Take 1 capsule (0.4 mg total) by mouth daily. 30 capsule    No current facility-administered medications for this visit.    PHYSICAL EXAMINATION: ECOG PERFORMANCE STATUS: 2 - Symptomatic, <50% confined to bed  Vitals:   04/06/20 1108  BP: 129/87  Pulse: 95  Resp: 16  Temp: (!) 97.5 F (36.4 C)  SpO2: 100%   Filed Weights   04/06/20 1108  Weight: 115 lb 14.4 oz (52.6 kg)    GENERAL:alert, no distress and comfortable SKIN: skin color, texture, turgor are normal, no rashes or significant lesions EYES: normal, Conjunctiva are pink and non-injected, sclera clear  NECK: supple, thyroid normal size, non-tender, without nodularity LYMPH:  no palpable lymphadenopathy in the cervical, axillary  LUNGS: clear to auscultation and percussion with normal breathing effort HEART: regular rate & rhythm and no murmurs and no lower extremity edema ABDOMEN:abdomen soft, non-tender and normal bowel sounds Musculoskeletal:no cyanosis of digits and no clubbing  NEURO: alert &  oriented x 3 with fluent speech, no focal motor/sensory deficits  LABORATORY DATA:  I have reviewed the data as listed CBC Latest Ref Rng & Units 04/06/2020 01/06/2020 12/22/2019  WBC 4.0 - 10.5 K/uL 3.4(L) 4.7 4.5  Hemoglobin 13.0 - 17.0 g/dL 12.3(L) 12.0(L) 10.2(L)  Hematocrit 39.0 - 52.0 % 36.4(L) 36.7(L) 32.1(L)  Platelets 150 - 400 K/uL 189 212 316     CMP Latest Ref Rng & Units 04/06/2020 01/06/2020 12/25/2019  Glucose 70 - 99 mg/dL 96 93 93  BUN 8 - 23 mg/dL 14 9 14   Creatinine 0.61 - 1.24 mg/dL 0.74 0.78 0.89  Sodium 135 - 145 mmol/L 135 141 140  Potassium 3.5 - 5.1 mmol/L 4.4 4.2 4.4  Chloride 98 - 111 mmol/L 106 105  105  CO2 22 - 32 mmol/L 23 28 26   Calcium 8.9 - 10.3 mg/dL 9.6 9.7 9.3  Total Protein 6.5 - 8.1 g/dL 7.2 7.0 -  Total Bilirubin 0.3 - 1.2 mg/dL 0.6 0.5 -  Alkaline Phos 38 - 126 U/L 100 178(H) -  AST 15 - 41 U/L 25 21 -  ALT 0 - 44 U/L 30 20 -      RADIOGRAPHIC STUDIES: I have personally reviewed the radiological images as listed and agreed with the findings in the report. No results found.   ASSESSMENT & PLAN:  Carlos [name] is a 75 y.o. male with    1. Adenocarcinoma of the rectum, cT4N1M0 stage III, ypT1aN0  -He was initially diagnosed in 4/2021with locally advanced stage III rectal cancer.  -He was treated with total neoadjvant chemotherapy FOLFOX 07/02/19-10/08/19 and concurrent chemoRT with Xeloda1500mg  BIDfor 5-6 weeks 11/02/19-12/07/19. He missed last 2 treatments due to hospitalization for Seizure and left femur fracture.  -He underwent low anterior resection surgery with Dr Morton Stall at [facility] on 02/08/20. Surgical pathology showed complete surgical resection with mild T1 residual disease, LN negative.  -I discussed given low residual disease he does not need adjuvant therapy. We discussed his risk for recurrence, which to be high, due to the initial stage III disease.   --I discussed the surveillance plan, which is a physical exam and lab test (including  CBC, CMP and CEA) every 3 months for the first 2 years, then every 6-12 months, colonoscopy in one year, and surveilliance CT scan every 6-12 month for up to 5 year.  Will proceed with close surveillance.  -He will likely proceed with ostomy take down in April. He will continue to f/u with his surgeon. He will likely have PAC removed with surgery  -f/u in 3 months with next CT CAP    2. Seizure 09/11/19, Left femur fracture  -He had another Seizure on in 12/2019. When he went to ED on 12/15/19 he fell out due to seizure resulting in a left femur fracture.  -He was in hospital from 11/9-11/24 and then rehab which he currently is in. Rehab plans to release him soon, but sister does not feel he is ready as he is still ambulating with walker and he would live upstairs when he goes home. -He did complete rehab after anal surgery and is currently doing home PT. He is ambulating better independently but notes stiffness in knees and arms.     3. Mild cognitive impairment, Social support -followed by [employer] [facility] -He is a reliable historian, independent with ADLs, a/o x4, no signs of obvious impairment -Will monitor for neurotoxicities on chemo, especially oxaliplatin -He has very good family support from his 2 sisters.   PLAN: -F/u in 3 months with lab, flush and CT CAP W contrast a few days before  -he will proceed with ileostomy reversal surgery at [facility] in April   No problem-specific Assessment & Plan notes found for this encounter.   Orders Placed This Encounter  Procedures  . CT CHEST ABDOMEN PELVIS W CONTRAST    Standing Status:   Future    Standing Expiration Date:   04/06/2021    Order Specific Question:   If indicated for the ordered procedure, I authorize the administration of contrast media per Radiology protocol    Answer:   Yes    Order Specific Question:   Preferred imaging location?    Answer:   [facility]    Order  Specific Question:   Release to patient     Answer:   Immediate    Order Specific Question:   Is Oral Contrast requested for this exam?    Answer:   Yes, Per Radiology protocol    Order Specific Question:   Reason for Exam (SYMPTOM  OR DIAGNOSIS REQUIRED)    Answer:   RULE OUT RECURRENCE   All questions were answered. The patient knows to call the clinic with any problems, questions or concerns. No barriers to learning was detected. The total time spent in the appointment was 30 minutes.     Carlos Merle, MD 04/06/2020   I, Joslyn Devon, am acting as scribe for Carlos Merle, MD.   I have reviewed the above documentation for accuracy and completeness, and I agree with the above.

## 2020-04-06 ENCOUNTER — Encounter: Payer: Self-pay | Admitting: Hematology

## 2020-04-06 ENCOUNTER — Telehealth: Payer: Self-pay | Admitting: Hematology

## 2020-04-06 ENCOUNTER — Inpatient Hospital Stay (HOSPITAL_BASED_OUTPATIENT_CLINIC_OR_DEPARTMENT_OTHER): Payer: 59 | Admitting: Hematology

## 2020-04-06 ENCOUNTER — Inpatient Hospital Stay: Payer: 59 | Attending: Nurse Practitioner

## 2020-04-06 ENCOUNTER — Inpatient Hospital Stay: Payer: 59

## 2020-04-06 ENCOUNTER — Other Ambulatory Visit: Payer: Self-pay

## 2020-04-06 VITALS — BP 129/87 | HR 95 | Temp 97.5°F | Resp 16 | Ht 67.0 in | Wt 115.9 lb

## 2020-04-06 DIAGNOSIS — C20 Malignant neoplasm of rectum: Secondary | ICD-10-CM | POA: Diagnosis not present

## 2020-04-06 DIAGNOSIS — Z9221 Personal history of antineoplastic chemotherapy: Secondary | ICD-10-CM | POA: Diagnosis not present

## 2020-04-06 DIAGNOSIS — Z923 Personal history of irradiation: Secondary | ICD-10-CM | POA: Insufficient documentation

## 2020-04-06 DIAGNOSIS — Z95828 Presence of other vascular implants and grafts: Secondary | ICD-10-CM

## 2020-04-06 LAB — CMP (CANCER CENTER ONLY)
ALT: 30 U/L (ref 0–44)
AST: 25 U/L (ref 15–41)
Albumin: 4 g/dL (ref 3.5–5.0)
Alkaline Phosphatase: 100 U/L (ref 38–126)
Anion gap: 6 (ref 5–15)
BUN: 14 mg/dL (ref 8–23)
CO2: 23 mmol/L (ref 22–32)
Calcium: 9.6 mg/dL (ref 8.9–10.3)
Chloride: 106 mmol/L (ref 98–111)
Creatinine: 0.74 mg/dL (ref 0.61–1.24)
GFR, Estimated: >60 mL/min (ref >60)
Glucose, Bld: 96 mg/dL (ref 70–99)
Potassium: 4.4 mmol/L (ref 3.5–5.1)
Sodium: 135 mmol/L (ref 135–145)
Total Bilirubin: 0.6 mg/dL (ref 0.3–1.2)
Total Protein: 7.2 g/dL (ref 6.5–8.1)

## 2020-04-06 LAB — CBC WITH DIFFERENTIAL (CANCER CENTER ONLY)
Abs Immature Granulocytes: 0.02 10*3/uL (ref 0.00–0.07)
Basophils Absolute: 0 10*3/uL (ref 0.0–0.1)
Basophils Relative: 0 %
Eosinophils Absolute: 0.1 10*3/uL (ref 0.0–0.5)
Eosinophils Relative: 2 %
HCT: 36.4 % — ABNORMAL LOW (ref 39.0–52.0)
Hemoglobin: 12.3 g/dL — ABNORMAL LOW (ref 13.0–17.0)
Immature Granulocytes: 1 %
Lymphocytes Relative: 32 %
Lymphs Abs: 1.1 10*3/uL (ref 0.7–4.0)
MCH: 27.8 pg (ref 26.0–34.0)
MCHC: 33.8 g/dL (ref 30.0–36.0)
MCV: 82.4 fL (ref 80.0–100.0)
Monocytes Absolute: 0.3 10*3/uL (ref 0.1–1.0)
Monocytes Relative: 10 %
Neutro Abs: 1.9 10*3/uL (ref 1.7–7.7)
Neutrophils Relative %: 55 %
Platelet Count: 189 10*3/uL (ref 150–400)
RBC: 4.42 MIL/uL (ref 4.22–5.81)
RDW: 14.3 % (ref 11.5–15.5)
WBC Count: 3.4 10*3/uL — ABNORMAL LOW (ref 4.0–10.5)
nRBC: 0 % (ref 0.0–0.2)

## 2020-04-06 MED ORDER — SODIUM CHLORIDE 0.9% FLUSH
10.0000 mL | Freq: Once | INTRAVENOUS | Status: AC
  Administered 2020-04-06: 10 mL
  Filled 2020-04-06: qty 10

## 2020-04-06 MED ORDER — HEPARIN SOD (PORK) LOCK FLUSH 100 UNIT/ML IV SOLN
250.0000 [IU] | Freq: Once | INTRAVENOUS | Status: AC
  Administered 2020-04-06: 500 [IU]
  Filled 2020-04-06: qty 5

## 2020-04-06 NOTE — Patient Instructions (Signed)

## 2020-04-06 NOTE — Telephone Encounter (Signed)
Left message with follow-up appointments per 3/2 los. Gave option to call back to reschedule if needed.

## 2020-04-13 ENCOUNTER — Telehealth: Payer: Self-pay | Admitting: Nutrition

## 2020-04-13 NOTE — Telephone Encounter (Signed)
Telephone follow-up completed with patient's Sister [name].  Patient is status post low anterior resection with ileostomy on February 08, 2020.  Plans are for an ostomy takedown in April.  Weight decreased to 115.9 pounds March 2.  Labs were reviewed.  Patient and sister seeking information on diet after ileostomy as well as strategies to increase weight and prepare patient for surgery.  Nutrition diagnosis: Food and nutrition related knowledge deficit continues.  Intervention: Educated on post ileostomy diet.  Reviewed foods that could potentially cause diarrhea, increased gas or odor, and blockage.  Encouraged gradual resumption to normal diet as tolerated. Recommended oral nutrition supplements 3 times a day. Reviewed strategies for adding calories and protein. Encouraged 5-6 small meals and snacks daily and importance of chewing food well. Recommended minimum of 10 cups of liquid daily. Mailed fact sheets to patient's home address. Questions were answered.  Teach back method used.  Contact information provided.  Monitoring, evaluation, goals: Patient will tolerate increased calories and protein to minimize further weight loss and promote healing.  Next visit: To be scheduled as needed.  **Disclaimer: This note was dictated with voice recognition software. Similar sounding words can inadvertently be transcribed and this note may contain transcription errors which may not have been corrected upon publication of note.**

## 2020-04-19 ENCOUNTER — Telehealth: Payer: Self-pay

## 2020-04-19 NOTE — Telephone Encounter (Signed)
Ov note from 04/06/2020 faxed to Dr Morton Stall at [facility]

## 2020-04-21 ENCOUNTER — Telehealth: Payer: Self-pay | Admitting: Neurology

## 2020-04-21 NOTE — Telephone Encounter (Signed)
Please see request. thanks

## 2020-04-21 NOTE — Telephone Encounter (Signed)
Patient's sister called about the patient's prescription for Keppra. She'd like to have it switched to 1,000 mg tablet and 250 mg to take twice a day. She said it will be easier to be compliant with taking it.  [facility] on [address]

## 2020-04-22 ENCOUNTER — Ambulatory Visit: Payer: 59 | Admitting: Neurology

## 2020-04-22 ENCOUNTER — Other Ambulatory Visit: Payer: Self-pay

## 2020-04-22 MED ORDER — LEVETIRACETAM 250 MG PO TABS: ORAL_TABLET | ORAL | 3 refills | Status: DC

## 2020-04-22 MED ORDER — LEVETIRACETAM 1000 MG PO TABS: ORAL_TABLET | ORAL | 3 refills | Status: DC

## 2020-04-22 NOTE — Telephone Encounter (Signed)
Patient's sister called again, she was just made aware that the patient's pharmacy needs to be changed due to insurance. She would still like for the original message request of the tablet change to take place, however, they need to change the pharmacy to the [facility] on [address]. She states that they need to pick up the medication today as he is almost out

## 2020-04-22 NOTE — Telephone Encounter (Signed)
Done, Rx sent for Keppra 1000mg  tab BID, take with Keppra 250mg  tab BID, for total for 1250mg  BID. Thanks

## 2020-05-30 DIAGNOSIS — E119 Type 2 diabetes mellitus without complications: Secondary | ICD-10-CM | POA: Diagnosis not present

## 2020-05-30 DIAGNOSIS — N39498 Other specified urinary incontinence: Secondary | ICD-10-CM | POA: Diagnosis not present

## 2020-05-30 DIAGNOSIS — N401 Enlarged prostate with lower urinary tract symptoms: Secondary | ICD-10-CM | POA: Diagnosis not present

## 2020-05-30 DIAGNOSIS — C19 Malignant neoplasm of rectosigmoid junction: Secondary | ICD-10-CM | POA: Diagnosis not present

## 2020-05-30 DIAGNOSIS — Z4801 Encounter for change or removal of surgical wound dressing: Secondary | ICD-10-CM | POA: Diagnosis not present

## 2020-05-30 DIAGNOSIS — G40909 Epilepsy, unspecified, not intractable, without status epilepticus: Secondary | ICD-10-CM | POA: Diagnosis not present

## 2020-05-30 DIAGNOSIS — Z432 Encounter for attention to ileostomy: Secondary | ICD-10-CM | POA: Diagnosis not present

## 2020-05-30 DIAGNOSIS — S72142D Displaced intertrochanteric fracture of left femur, subsequent encounter for closed fracture with routine healing: Secondary | ICD-10-CM | POA: Diagnosis not present

## 2020-05-30 DIAGNOSIS — F419 Anxiety disorder, unspecified: Secondary | ICD-10-CM | POA: Diagnosis not present

## 2020-05-30 DIAGNOSIS — Z483 Aftercare following surgery for neoplasm: Secondary | ICD-10-CM | POA: Diagnosis not present

## 2020-05-30 DIAGNOSIS — C61 Malignant neoplasm of prostate: Secondary | ICD-10-CM | POA: Diagnosis not present

## 2020-05-30 DIAGNOSIS — F0150 Vascular dementia without behavioral disturbance: Secondary | ICD-10-CM | POA: Diagnosis not present

## 2020-06-02 ENCOUNTER — Telehealth: Payer: Self-pay | Admitting: Hematology

## 2020-06-02 NOTE — Telephone Encounter (Signed)
Sch per 4/28 sch msg, Patient sister aware

## 2020-06-03 NOTE — Progress Notes (Addendum)
Assessment/Plan:   Mild neurocognitive disorder of unclear etiology Pleasant 75 year old right-handed man with mild neurocognitive disorder of unclear etiology, which per neuropsychological testing, findings could suggest prominent left temporal dysfunction. Currently, the patient is not on [facility]. MMSE today is 26/30   Recommendations are as follows:  Marland Kitchen Discussed safety both in and out of the home.  . Discussed the importance of regular daily schedule with inclusion of crossword puzzles to maintain brain function.  . Continue to monitor mood by PCP  . Continue to stay active exercising  at least 30 minutes at least 3 times a week.  . Naps should be scheduled and should be no longer than 60 minutes and should not occur after 2 PM.  . Mediterranean diet is recommended   Repeat neuropsychological testing and follow-up after the testing as the patient wishes to return back to the work  Follow up in 4 months after his results of neurocognitive testing are available.  Localization-related epilepsy, temporal lobe This was diagnosed in a hospitalization for confusional episode on 09/14/2019 with EEG showed left frequent frontotemporal epileptiform discharges occasional right temporal epileptiform discharges. Last EEG was remarkable for known independent L and R  Temporal spikes, but negative for seizures .  He had another seizure in November 2021, with negative MRI of the brain for intracranial abnormalities.  Since that time, the patient has no further confusion or seizures.   Recommendations are as follows:   Continue levetiracetam 1000 mg twice daily, along with levetiracetam 250 mg bid total 1250 mg bid   Discussed again [place] driving laws, will revisit return to driving, as by then greater than 6 months may have passed by, and the patient will have had eye examination versus eye surgery, in order to improve his vision.   Case discussed with Dr. Delice Lesch who agrees with the  plan    Subjective:   Carlos [name] was seen today in a 76-month follow up for memory loss.  "Carlos [name] "is a pleasant 75 year old right-handed man with a history of rectal adenocarcinoma s/p chemotherapy and radiation status post resection 02/08/20, history of seizures on levetiracetam 1000 mg twice daily, diagnosis of mild neurocognitive disorder last seen at our office on 09/23/2019 having been seen earlier due to cancellations by patient. This patient is accompanied in the office by his sister [name] who supplements the history.  Previous records as well as any outside records available were reviewed prior to todays visit.   He had an appointment on 03/03/2020 with Dr. Melvyn Novas, and with Dr. Delice Lesch on 2/1 and 3/18 but this had to be canceled due to surgical resection of his rectal tumor and reversal of colostomy on 05/11/20. He is now recovering well . He is not on donepezil, because family felt that this was not the best thing for him, "since he has MCI and not dementia" on prior psychological testing.  He continues to be on an OTC memory supplement called Mindworks, which family reports may be helping. The patient feels that his memory is "regular ", occasionally losing keys, also repeating at times the same questions.  He feels that this changes are related that decreased activity, "feeling lazier "but he is eager to go back to work, to help with his mood, memory, and his overall persona.  His sister states that he may a "little depressed" and is going to address this with his primary care physician this week. Denies headaches, trauma, or injuries to the head. No numbness or tingling. Denies  tremors or unilateral weakness.  His vision is decreased due to chronic eye disease, he is getting ready for surgery sometime in the next couple of months.  He denies any hallucinations or paranoia.  His sleep is "okay ", denying acting out in his dreams. Patient dresses up and bathes without help.  The patient pays his bills,  but at times he does not open them, and his sister has to pay them.  His appetite is good, denies any constipation or diarrhea, and his abdominal surgery is healing well.  He does not cook.  He denies any urinary complaints.  He ambulates without difficulty, without a walker or cane.  As for his seizures, he had no further events since November 2021, the patient wants to go back to work and be able to drive, but he knows that he first needs "take care of the eyes "and he has an appointment with his ophthalmologist to address these, as he may need surgery to improve his vision.  He denies any auras, headaches, any taste changes, dizziness, or "jerks ".  He denies any neck or back pain.  No falls as stated earlier.    History on Initial Assessment 11/07/2018: This is a pleasant 75 year old right-handed man with no significant past medical history presenting for evaluation of memory loss and frequent falls. His sister is present to provide additional information. He thinks his memory is reasonable. He states family has noticed more changes, they ask him things and "I don't give good answers." He endorses short-term memory changes. He lives with his 64 year old mother who has 4 caregivers. 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 He denies getting lost driving. He denies any missed bill payments. He is not on any medications. He denies any difficulties at work, he states it is routine work. His sister started noticing changes a year ago, he would forget conversations. He would forget he had already finished a task, for instance that he had already came from the grocery store. She feels symptoms worsened in the past 5-6 months. Last month, he was visiting another sister and forgot the last portion of the trip, he did not get to his destination and just went home. He has told family he has gotten lost coming home from work, he arrived home an hour later (he has been in the same place for 7 years). His family is  also concerned about an increase in frequency of falls. He has had 3 falls in the past 6 months. He reports injuring his foot and hitting his head with a fall in April, then hurting his back in July. He thinks it is due to clumsiness. No loss of consciousness. They are concerned about the possibility of a brain tumor, their father had a brain tumor. There is a history of memory loss in his mother and younger brother (secondary to stroke). He denies any history of significant head injuries or alcohol use.   He denies any headaches, dizziness, diplopia, dysarthria, dysphagia, neck/back pain, focal numbness/tingling/weakness, bowel/bladder dysfunction, anosmia, or tremors. He felt unwell last night and BP was 151/86, he went home early from work. He states he is on his feet 12 hours a day, and he is now required to work 6 days a week (mandatory due to Covid-19 changes), he was previously working 5 days a week. His sister is concerned he is not eating well, mostly sugars and hotdogs. He is overly tired and sleepy some days. Mood is  good, he has "always been hyper, but now it it more" per sister. No hallucinations or paranoia.   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 There were "impairments in retrieval/consolidation aspects of verbal memory, working memory, confrontation naming, and complex visuoconstructional abilities. Additional variability was seen across processing speed and executive functioning. Visual encoding (i.e., learning) was also a relative weakness. Findings could suggest prominent left temporal dysfunction assuming normal lateralization in this right-handed individual. However, other aspects of expressive language (i.e.,  verbal fluency) were within normal limits. The aforementioned deficits, coupled with additional weaknesses acrossaspects of executive functioning andmore advanced visuoconstructional abilities could be concerning for Alzheimer's disease. However appropriate retrieval/consolidation aspects of visual memory, coupled with strong semantic fluency scores,isinconsistent with what would typically be expected."  PREVIOUS MEDICATIONS: None  CURRENT MEDICATIONS:  Outpatient Encounter Medications as of 06/06/2020  Medication Sig  . levETIRAcetam (KEPPRA) 1000 MG tablet Take 1 tablet twice a day (take with Levetiracetam 250mg  tablet for total of 1250mg  twice a day)  . levETIRAcetam (KEPPRA) 250 MG tablet Take 1 tablet twice a day (take with Levetiracetam 1000mg  tablet for total of 1250mg  twice a day)  . loperamide (IMODIUM) 2 MG capsule Take 1 capsule (2 mg total) by mouth every 6 (six) hours as needed for diarrhea or loose stools.  . Multiple Vitamin (MULTIVITAMIN WITH MINERALS) TABS tablet Take 1 tablet by mouth daily.  . NON FORMULARY Take 1 capsule by mouth daily. Mind works  . psyllium (METAMUCIL) 58.6 % packet Take 1 packet by mouth daily.  . [DISCONTINUED] HYDROcodone-acetaminophen (NORCO/VICODIN) 5-325 MG tablet Take 1 tablet by mouth every 4 (four) hours as needed for moderate pain.  . [DISCONTINUED] melatonin 3 MG TABS tablet Take 1 tablet (3 mg total) by mouth at bedtime as needed (insomnia). (Patient not taking: Reported on 02/01/2020)  . [DISCONTINUED] methocarbamol (ROBAXIN) 500 MG tablet Take 1 tablet (500 mg total) by mouth every 6 (six) hours as needed for muscle spasms. (Patient not taking: Reported on 02/01/2020)  . [DISCONTINUED] pantoprazole (PROTONIX) 40 MG tablet Take 1 tablet (40 mg total) by mouth daily.  . [DISCONTINUED] polycarbophil (FIBERCON) 625 MG tablet Take 2 tablets (1,250 mg total) by mouth daily.  . [DISCONTINUED] saccharomyces boulardii (FLORASTOR) 250 MG capsule Take 1  capsule (250 mg total) by mouth 2 (two) times daily. (Patient not taking: Reported on 02/01/2020)  . [DISCONTINUED] tamsulosin (FLOMAX) 0.4 MG CAPS capsule Take 1 capsule (0.4 mg total) by mouth daily.   No facility-administered encounter medications on file as of 06/06/2020.     Objective:     PHYSICAL EXAMINATION:    VITALS:   Vitals:   06/06/20 0906  BP: 129/70  Pulse: 99  SpO2: 100%  Weight: 124 lb 9.6 oz (56.5 kg)  Height: 5\' 4"  (1.626 m)    GEN:  The patient appears stated age and is in NAD. HEENT:  Normocephalic, atraumatic.   Neurological examination:  Orientation: The patient is alert. Oriented to person, place and date  Cranial nerves: There is good facial symmetry.The speech is fluent and clear. No aphasia or dysarthria. Fund of knowledge is appropriate. Recent and remote memory are slightly impaired. Attention and concentration are mildly reduced. Able to name objects and repeat phrases.  Hearing is intact to conversational tone.  MMSE 26/30 Sensation: Sensation is intact to light touch throughout Motor: Strength is at least antigravity x4.  Montreal Cognitive Assessment  11/07/2018  Visuospatial/ Executive (0/5) 3  Naming (0/3) 3  Attention: Read list of digits (0/2) 2  Attention: Read list of letters (0/1) 1  Attention: Serial 7 subtraction starting at 100 (0/3) 3  Language: Repeat phrase (0/2) 2  Language : Fluency (0/1) 0  Abstraction (0/2) 2  Delayed Recall (0/5) 0  Orientation (0/6) 6  Total 22  Adjusted Score (based on education) 22    MMSE - [place]s Exam 06/06/2020  Orientation to time 5  Orientation to Place 5  Registration 3  Attention/ Calculation 3  Recall 2  Language- name 2 objects 2  Language- repeat 1  Language- follow 3 step command 3  Language- read & follow direction 1  Write a sentence 1  Copy design 0  Total score 26     Movement examination: Tone: There is normal tone in the UE/LE Abnormal movements:  no tremor.   No myoclonus.  No asterixis.   Coordination:  There is no decremation with RAM's.  Gait and Station: The patient has no difficulty arising out of a deep-seated chair without the use of the hands. The patient's stride length is good.  Gait is cautious and narrow.    CBC CBC Latest Ref Rng & Units 04/06/2020 01/06/2020 12/22/2019  WBC 4.0 - 10.5 K/uL 3.4(L) 4.7 4.5  Hemoglobin 13.0 - 17.0 g/dL 12.3(L) 12.0(L) 10.2(L)  Hematocrit 39.0 - 52.0 % 36.4(L) 36.7(L) 32.1(L)  Platelets 150 - 400 K/uL 189 212 316     CMP Latest Ref Rng & Units 04/06/2020 01/06/2020 12/25/2019  Glucose 70 - 99 mg/dL 96 93 93  BUN 8 - 23 mg/dL 14 9 14   Creatinine 0.61 - 1.24 mg/dL 0.74 0.78 0.89  Sodium 135 - 145 mmol/L 135 141 140  Potassium 3.5 - 5.1 mmol/L 4.4 4.2 4.4  Chloride 98 - 111 mmol/L 106 105 105  CO2 22 - 32 mmol/L 23 28 26   Calcium 8.9 - 10.3 mg/dL 9.6 9.7 9.3  Total Protein 6.5 - 8.1 g/dL 7.2 7.0 -  Total Bilirubin 0.3 - 1.2 mg/dL 0.6 0.5 -  Alkaline Phos 38 - 126 U/L 100 178(H) -  AST 15 - 41 U/L 25 21 -  ALT 0 - 44 U/L 30 20 -       Total time spent on today's visit was 55 minutes, including both face-to-face time and nonface-to-face time.  Time included that spent on review of records (prior notes available to me/labs/imaging if pertinent), discussing treatment and goals, answering patient's questions and coordinating care.  Cc:  [name], MD Sharene Butters, PA-C

## 2020-06-06 ENCOUNTER — Ambulatory Visit: Payer: 59

## 2020-06-06 ENCOUNTER — Ambulatory Visit: Payer: 59 | Admitting: Physician Assistant

## 2020-06-06 ENCOUNTER — Encounter: Payer: Self-pay | Admitting: Physician Assistant

## 2020-06-06 ENCOUNTER — Telehealth: Payer: Self-pay

## 2020-06-06 ENCOUNTER — Other Ambulatory Visit: Payer: Self-pay

## 2020-06-06 VITALS — BP 129/70 | HR 99 | Ht 64.0 in | Wt 124.6 lb

## 2020-06-06 DIAGNOSIS — G3184 Mild cognitive impairment, so stated: Secondary | ICD-10-CM | POA: Diagnosis not present

## 2020-06-06 DIAGNOSIS — G40009 Localization-related (focal) (partial) idiopathic epilepsy and epileptic syndromes with seizures of localized onset, not intractable, without status epilepticus: Secondary | ICD-10-CM

## 2020-06-06 NOTE — Telephone Encounter (Signed)
Carlos [name] left message with [facility] cancelling his appt for covid booster schedule for 06/06/2020 at 1300

## 2020-06-06 NOTE — Patient Instructions (Addendum)
It was a pleasure to see you today at our office.   Recommendations:  Meds: Follow up in 4 months Neurocognitive evaluation at our office prior to the appt with me  Mediterranean diet when you heal from the ostomy surgery    RECOMMENDATIONS FOR ALL PATIENTS WITH MEMORY PROBLEMS: 1. Continue to exercise (Recommend 30 minutes of walking everyday, or 3 hours every week) 2. Increase social interactions - continue going to [place] and enjoy social gatherings with friends and family 3. Eat healthy, avoid fried foods and eat more fruits and vegetables 4. Maintain adequate blood pressure, blood sugar, and blood cholesterol level. Reducing the risk of stroke and cardiovascular disease also helps promoting better memory. 5. Avoid stressful situations. Live a simple life and avoid aggravations. Organize your time and prepare for the next day in anticipation. 6. Sleep well, avoid any interruptions of sleep and avoid any distractions in the bedroom that may interfere with adequate sleep quality 7. Avoid sugar, avoid sweets as there is a strong link between excessive sugar intake, diabetes, and cognitive impairment We discussed the Mediterranean diet, which has been shown to help patients reduce the risk of progressive memory disorders and reduces cardiovascular risk. This includes eating fish, eat fruits and green leafy vegetables, nuts like almonds and hazelnuts, walnuts, and also use olive oil. Avoid fast foods and fried foods as much as possible. Avoid sweets and sugar as sugar use has been linked to worsening of memory function.  There is always a concern of gradual progression of memory problems. If this is the case, then we may need to adjust level of care according to patient needs. Support, both to the patient and caregiver, should then be put into place.      You have been referred for a neuropsychological evaluation (i.e., evaluation of memory and thinking abilities). Please bring someone with  you to this appointment if possible, as it is helpful for the doctor to hear from both you and another adult who knows you well. Please bring eyeglasses and hearing aids if you wear them.    The evaluation will take approximately 3 hours and has two parts:   . The first part is a clinical interview with the neuropsychologist (Dr. Melvyn Novas or Dr. Nicole Kindred). During the interview, the neuropsychologist will speak with you and the individual you brought to the appointment.    . The second part of the evaluation is testing with the doctor's technician Hinton Dyer or Maudie Mercury). During the testing, the technician will ask you to remember different types of material, solve problems, and answer some questionnaires. Your family member will not be present for this portion of the evaluation.   Please note: We must reserve several hours of the neuropsychologist's time and the psychometrician's time for your evaluation appointment. As such, there is a No-Show fee of $100. If you are unable to attend any of your appointments, please contact our office as soon as possible to reschedule.      Mediterranean Diet A Mediterranean diet refers to food and lifestyle choices that are based on the traditions of countries located on the [employer]. This way of eating has been shown to help prevent certain conditions and improve outcomes for people who have chronic diseases, like kidney disease and heart disease. What are tips for following this plan? Lifestyle   Cook and eat meals together with your family, when possible.  Drink enough fluid to keep your urine clear or pale yellow.  Be physically active every day.  This includes:  Aerobic exercise like running or swimming.  Leisure activities like gardening, walking, or housework.  Get 7-8 hours of sleep each night.  If recommended by your health care provider, drink red wine in moderation. This means 1 glass a day for nonpregnant women and 2 glasses a day for men. A  glass of wine equals 5 oz (150 mL). Reading food labels   Check the serving size of packaged foods. For foods such as rice and pasta, the serving size refers to the amount of cooked product, not dry.  Check the total fat in packaged foods. Avoid foods that have saturated fat or trans fats.  Check the ingredients list for added sugars, such as corn syrup. Shopping   At the grocery store, buy most of your food from the areas near the walls of the store. This includes:  Fresh fruits and vegetables (produce).  Grains, beans, nuts, and seeds. Some of these may be available in unpackaged forms or large amounts (in bulk).  Fresh seafood.  Poultry and eggs.  Low-fat dairy products.  Buy whole ingredients instead of prepackaged foods.  Buy fresh fruits and vegetables in-season from local farmers markets.  Buy frozen fruits and vegetables in resealable bags.  If you do not have access to quality fresh seafood, buy precooked frozen shrimp or canned fish, such as tuna, salmon, or sardines.  Buy small amounts of raw or cooked vegetables, salads, or olives from the deli or salad bar at your store.  Stock your pantry so you always have certain foods on hand, such as olive oil, canned tuna, canned tomatoes, rice, pasta, and beans. Cooking   Cook foods with extra-virgin olive oil instead of using butter or other vegetable oils.  Have meat as a side dish, and have vegetables or grains as your main dish. This means having meat in small portions or adding small amounts of meat to foods like pasta or stew.  Use beans or vegetables instead of meat in common dishes like chili or lasagna.  Experiment with different cooking methods. Try roasting or broiling vegetables instead of steaming or sauteing them.  Add frozen vegetables to soups, stews, pasta, or rice.  Add nuts or seeds for added healthy fat at each meal. You can add these to yogurt, salads, or vegetable dishes.  Marinate fish or  vegetables using olive oil, lemon juice, garlic, and fresh herbs. Meal planning   Plan to eat 1 vegetarian meal one day each week. Try to work up to 2 vegetarian meals, if possible.  Eat seafood 2 or more times a week.  Have healthy snacks readily available, such as:  Vegetable sticks with hummus.  Greek yogurt.  Fruit and nut trail mix.  Eat balanced meals throughout the week. This includes:  Fruit: 2-3 servings a day  Vegetables: 4-5 servings a day  Low-fat dairy: 2 servings a day  Fish, poultry, or lean meat: 1 serving a day  Beans and legumes: 2 or more servings a week  Nuts and seeds: 1-2 servings a day  Whole grains: 6-8 servings a day  Extra-virgin olive oil: 3-4 servings a day  Limit red meat and sweets to only a few servings a month What are my food choices?  Mediterranean diet  Recommended  Grains: Whole-grain pasta. Brown rice. Bulgar wheat. Polenta. Couscous. Whole-wheat bread. [place].  Vegetables: Artichokes. Beets. Broccoli. Cabbage. Carrots. Eggplant. Green beans. Chard. Kale. Spinach. Onions. Leeks. Peas. Squash. Tomatoes. Peppers. Radishes.  Fruits: Apples. Apricots. Avocado.  Berries. Bananas. Cherries. Dates. Figs. Grapes. Lemons. Melon. Oranges. Peaches. Plums. Pomegranate.  Meats and other protein foods: Beans. Almonds. Sunflower seeds. Pine nuts. Peanuts. [place]. Salmon. Scallops. Shrimp. [place]. Tilapia. Clams. Oysters. Eggs.  Dairy: Low-fat milk. Cheese. Greek yogurt.  Beverages: Water. Red wine. Herbal tea.  Fats and oils: Extra virgin olive oil. Avocado oil. Grape seed oil.  Sweets and desserts: [place] yogurt with honey. Baked apples. Poached pears. Trail mix.  Seasoning and other foods: Basil. Cilantro. Coriander. Cumin. Mint. Parsley. Sage. Rosemary. Tarragon. Garlic. Oregano. Thyme. Pepper. Balsalmic vinegar. Tahini. Hummus. Tomato sauce. Olives. Mushrooms.  Limit these  Grains: Prepackaged pasta or rice dishes. Prepackaged  cereal with added sugar.  Vegetables: Deep fried potatoes (french fries).  Fruits: Fruit canned in syrup.  Meats and other protein foods: Beef. Pork. Lamb. Poultry with skin. Hot dogs. Berniece Salines.  Dairy: Ice cream. Sour cream. Whole milk.  Beverages: Juice. Sugar-sweetened soft drinks. Beer. Liquor and spirits.  Fats and oils: Butter. Canola oil. Vegetable oil. Beef fat (tallow). Lard.  Sweets and desserts: Cookies. Cakes. Pies. Candy.  Seasoning and other foods: Mayonnaise. Premade sauces and marinades.  The items listed may not be a complete list. Talk with your dietitian about what dietary choices are right for you. Summary  The Mediterranean diet includes both food and lifestyle choices.  Eat a variety of fresh fruits and vegetables, beans, nuts, seeds, and whole grains.  Limit the amount of red meat and sweets that you eat.  Talk with your health care provider about whether it is safe for you to drink red wine in moderation. This means 1 glass a day for nonpregnant women and 2 glasses a day for men. A glass of wine equals 5 oz (150 mL). This information is not intended to replace advice given to you by your health care provider. Make sure you discuss any questions you have with your health care provider. Document Released: 09/15/2015 Document Revised: 10/18/2015 Document Reviewed: 09/15/2015 Elsevier Interactive Patient Education  2017 [employer].

## 2020-06-20 ENCOUNTER — Telehealth: Payer: Self-pay | Admitting: Physician Assistant

## 2020-06-20 NOTE — Telephone Encounter (Signed)
Pt sister called back no answer left a voice mail to call the office back  

## 2020-06-20 NOTE — Telephone Encounter (Signed)
Patient's sister called and left a message requesting a call back from a nurse. She said she has questions about the patient's seizure activity and his driving.

## 2020-06-20 NOTE — Telephone Encounter (Signed)
Patient's sister returned the call.

## 2020-06-22 NOTE — Telephone Encounter (Signed)
Patient's sister called again to speak with the nurse.

## 2020-06-23 NOTE — Telephone Encounter (Signed)
Patients sister returned call

## 2020-06-23 NOTE — Telephone Encounter (Signed)
List of driver evaluators sent to pt for to look over and see where he would like to have his is evaluation done

## 2020-06-24 ENCOUNTER — Other Ambulatory Visit: Payer: Self-pay | Admitting: Hematology

## 2020-06-24 DIAGNOSIS — C20 Malignant neoplasm of rectum: Secondary | ICD-10-CM

## 2020-07-01 ENCOUNTER — Other Ambulatory Visit: Payer: Self-pay

## 2020-07-01 DIAGNOSIS — C20 Malignant neoplasm of rectum: Secondary | ICD-10-CM

## 2020-07-03 ENCOUNTER — Emergency Department (HOSPITAL_BASED_OUTPATIENT_CLINIC_OR_DEPARTMENT_OTHER)
Admission: EM | Admit: 2020-07-03 | Discharge: 2020-07-03 | Disposition: A | Discharge status: Home / Self Care | Payer: 59 | Attending: Emergency Medicine | Admitting: Emergency Medicine

## 2020-07-03 ENCOUNTER — Emergency Department (HOSPITAL_BASED_OUTPATIENT_CLINIC_OR_DEPARTMENT_OTHER): Payer: 59 | Admitting: Radiology

## 2020-07-03 ENCOUNTER — Encounter (HOSPITAL_BASED_OUTPATIENT_CLINIC_OR_DEPARTMENT_OTHER): Payer: Self-pay | Admitting: Emergency Medicine

## 2020-07-03 ENCOUNTER — Emergency Department (HOSPITAL_BASED_OUTPATIENT_CLINIC_OR_DEPARTMENT_OTHER): Payer: 59

## 2020-07-03 ENCOUNTER — Other Ambulatory Visit: Payer: Self-pay

## 2020-07-03 DIAGNOSIS — E871 Hypo-osmolality and hyponatremia: Secondary | ICD-10-CM | POA: Diagnosis not present

## 2020-07-03 DIAGNOSIS — Z85038 Personal history of other malignant neoplasm of large intestine: Secondary | ICD-10-CM | POA: Insufficient documentation

## 2020-07-03 DIAGNOSIS — D72829 Elevated white blood cell count, unspecified: Secondary | ICD-10-CM | POA: Diagnosis not present

## 2020-07-03 DIAGNOSIS — R4182 Altered mental status, unspecified: Secondary | ICD-10-CM | POA: Insufficient documentation

## 2020-07-03 DIAGNOSIS — R410 Disorientation, unspecified: Secondary | ICD-10-CM | POA: Diagnosis present

## 2020-07-03 LAB — COMPREHENSIVE METABOLIC PANEL
ALT: 26 U/L (ref 0–44)
AST: 29 U/L (ref 15–41)
Albumin: 4.1 g/dL (ref 3.5–5.0)
Alkaline Phosphatase: 67 U/L (ref 38–126)
Anion gap: 7 (ref 5–15)
BUN: 12 mg/dL (ref 8–23)
CO2: 30 mmol/L (ref 22–32)
Calcium: 9.3 mg/dL (ref 8.9–10.3)
Chloride: 92 mmol/L — ABNORMAL LOW (ref 98–111)
Creatinine, Ser: 0.6 mg/dL — ABNORMAL LOW (ref 0.61–1.24)
GFR, Estimated: >60 mL/min (ref >60)
Glucose, Bld: 111 mg/dL — ABNORMAL HIGH (ref 70–99)
Potassium: 4.4 mmol/L (ref 3.5–5.1)
Sodium: 129 mmol/L — ABNORMAL LOW (ref 135–145)
Total Bilirubin: 0.8 mg/dL (ref 0.3–1.2)
Total Protein: 6.7 g/dL (ref 6.5–8.1)

## 2020-07-03 LAB — URINALYSIS, ROUTINE W REFLEX MICROSCOPIC
Bilirubin Urine: NEGATIVE
Glucose, UA: NEGATIVE mg/dL
Hgb urine dipstick: NEGATIVE
Ketones, ur: NEGATIVE mg/dL
Leukocytes,Ua: NEGATIVE
Nitrite: NEGATIVE
Protein, ur: NEGATIVE mg/dL
Specific Gravity, Urine: <1.005 — ABNORMAL LOW (ref 1.005–1.030)
pH: 6.5 (ref 5.0–8.0)

## 2020-07-03 LAB — CBC WITH DIFFERENTIAL/PLATELET
Abs Immature Granulocytes: 0.06 10*3/uL (ref 0.00–0.07)
Basophils Absolute: 0 10*3/uL (ref 0.0–0.1)
Basophils Relative: 0 %
Eosinophils Absolute: 0 10*3/uL (ref 0.0–0.5)
Eosinophils Relative: 0 %
HCT: 38 % — ABNORMAL LOW (ref 39.0–52.0)
Hemoglobin: 12.8 g/dL — ABNORMAL LOW (ref 13.0–17.0)
Immature Granulocytes: 0 %
Lymphocytes Relative: 5 %
Lymphs Abs: 0.7 10*3/uL (ref 0.7–4.0)
MCH: 29.2 pg (ref 26.0–34.0)
MCHC: 33.7 g/dL (ref 30.0–36.0)
MCV: 86.8 fL (ref 80.0–100.0)
Monocytes Absolute: 1.4 10*3/uL — ABNORMAL HIGH (ref 0.1–1.0)
Monocytes Relative: 10 %
Neutro Abs: 12 10*3/uL — ABNORMAL HIGH (ref 1.7–7.7)
Neutrophils Relative %: 85 %
Platelets: 187 10*3/uL (ref 150–400)
RBC: 4.38 MIL/uL (ref 4.22–5.81)
RDW: 13.3 % (ref 11.5–15.5)
WBC: 14.1 10*3/uL — ABNORMAL HIGH (ref 4.0–10.5)
nRBC: 0 % (ref 0.0–0.2)

## 2020-07-03 LAB — AMMONIA: Ammonia: 40 umol/L — ABNORMAL HIGH (ref 9–35)

## 2020-07-03 LAB — TROPONIN I (HIGH SENSITIVITY): Troponin I (High Sensitivity): 4 ng/L (ref <18)

## 2020-07-03 LAB — PROTIME-INR
INR: 1 (ref 0.8–1.2)
Prothrombin Time: 13.5 seconds (ref 11.4–15.2)

## 2020-07-03 IMAGING — CT CT HEAD W/O CM
4 series · 17 of 47 positions shown, 19 images · non-contrast
Comparison: [DATE].

CLINICAL DATA: Confusion. Altered mental status since yesterday
around 11 a.m.

EXAM:
CT HEAD WITHOUT CONTRAST
TECHNIQUE: Contiguous axial images were obtained from the base of the skull
through the vertex without intravenous contrast.

[Series 2: head wo · axial · 0.46mm/px · z∈[-395,-270]mm · 7 of 35 slices shown, 9 images]
[im 5/35  brain]
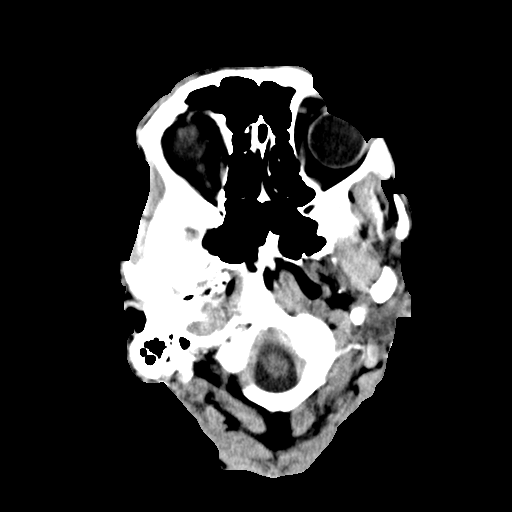
[im 5/35  bone]
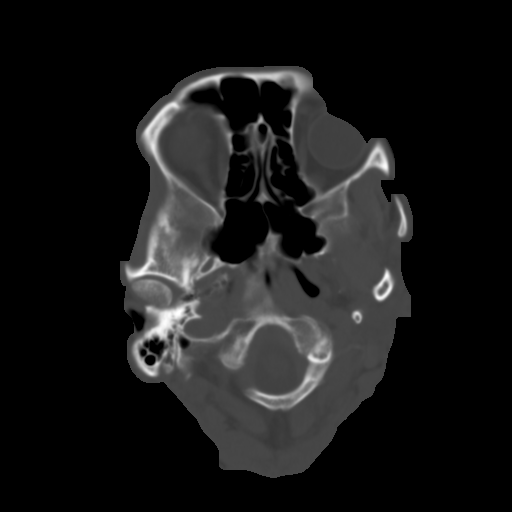
[im 9/35  brain]
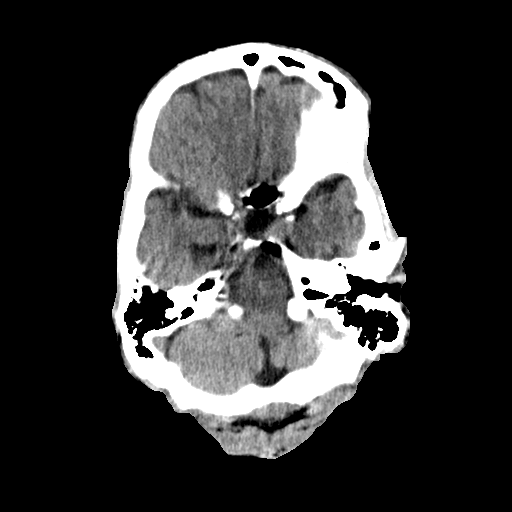
[im 13/35  brain]
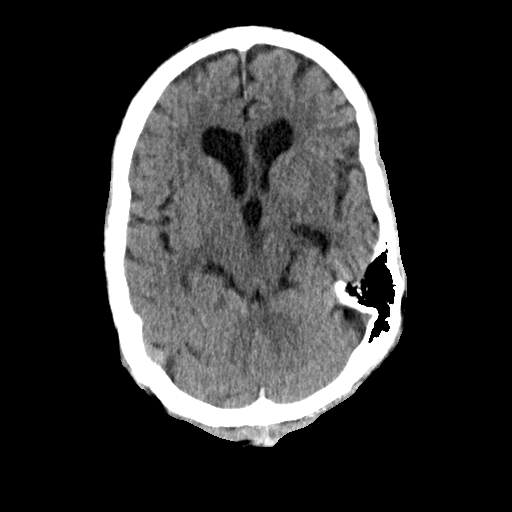
[im 18/35  brain]
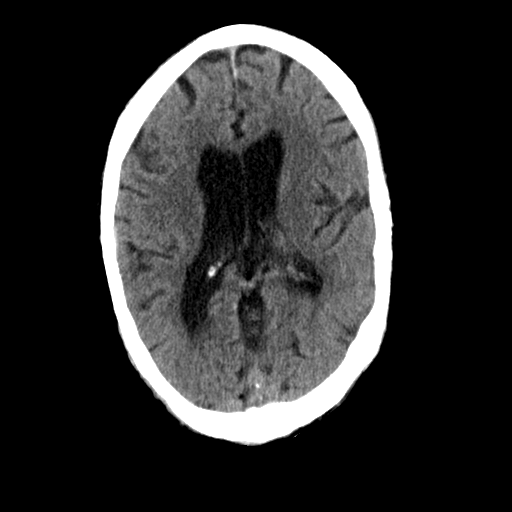
[im 22/35  brain]
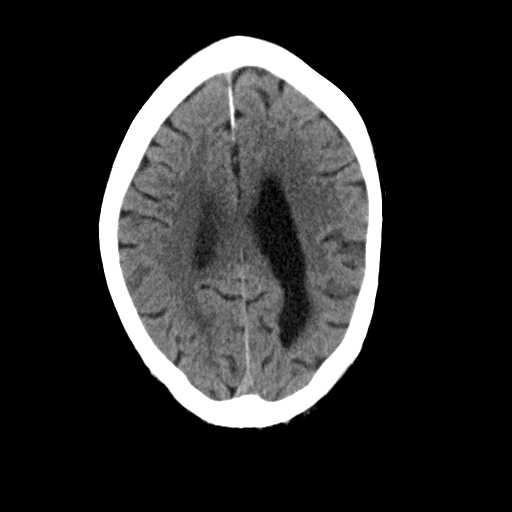
[im 22/35  bone]
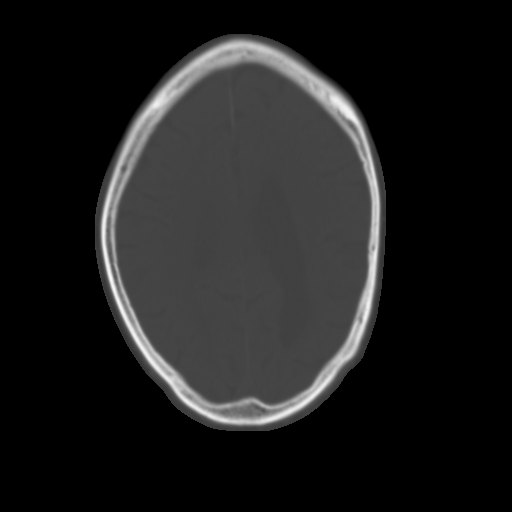
[im 26/35  brain]
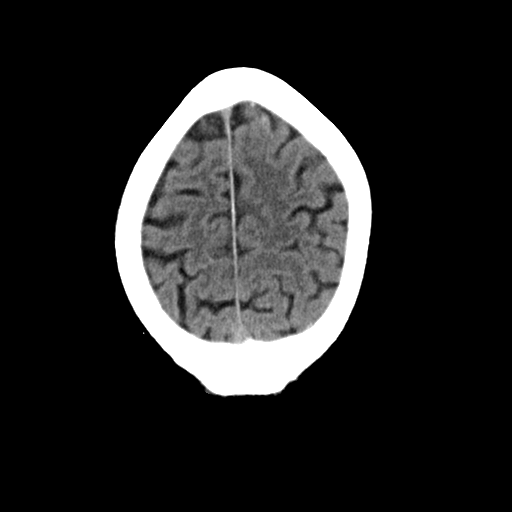
[im 30/35  brain]
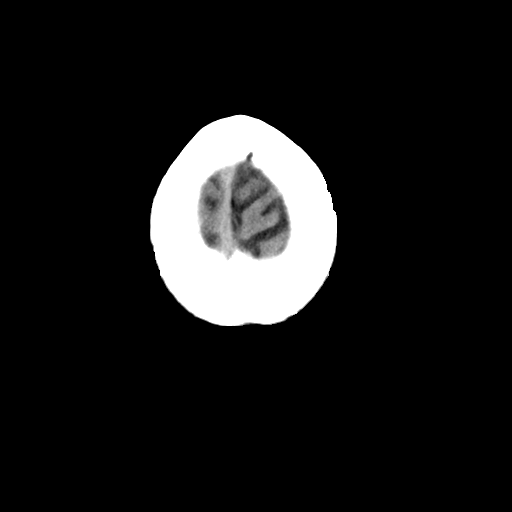

[Series 3: head bone · axial · 0.46mm/px · z∈[-399,-339]mm · 4 of 86 slices shown]
[im 9/86  bone]
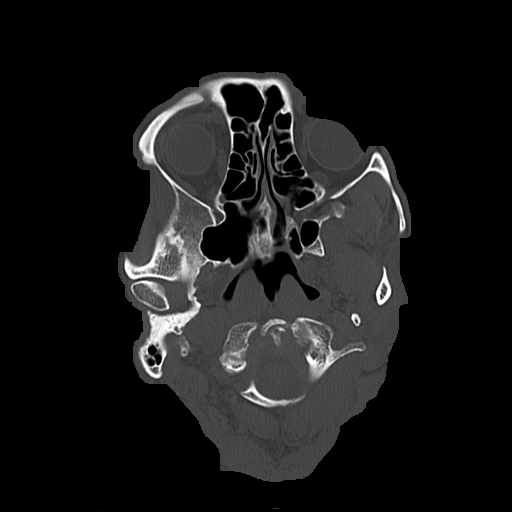
[im 18/86  bone]
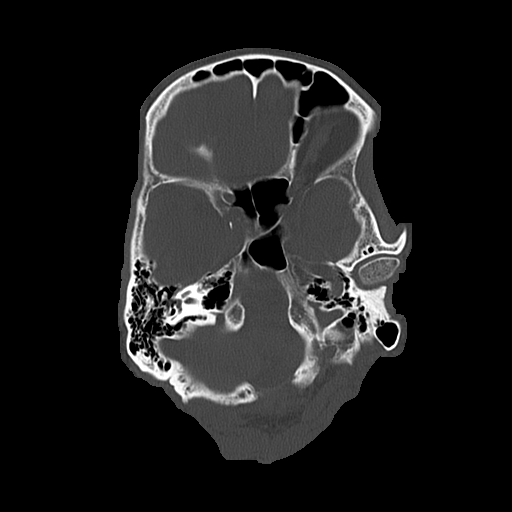
[im 26/86  bone]
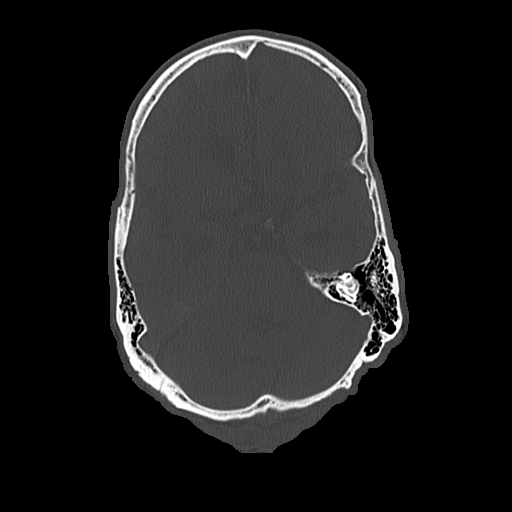
[im 39/86  bone]
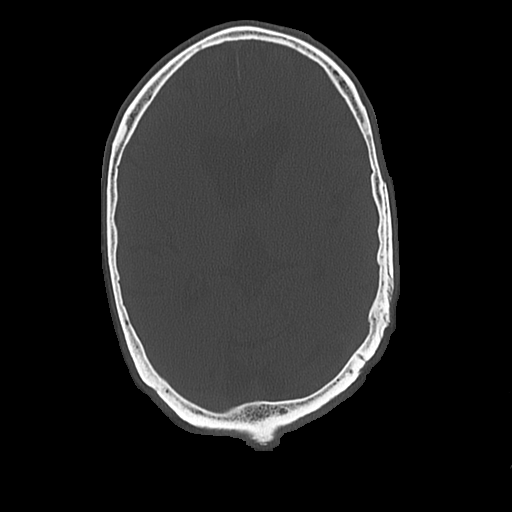

[Series 4: coronal soft · coronal · 0.34mm/px · 3 of 73 slices shown]
[im 29/73  brain]
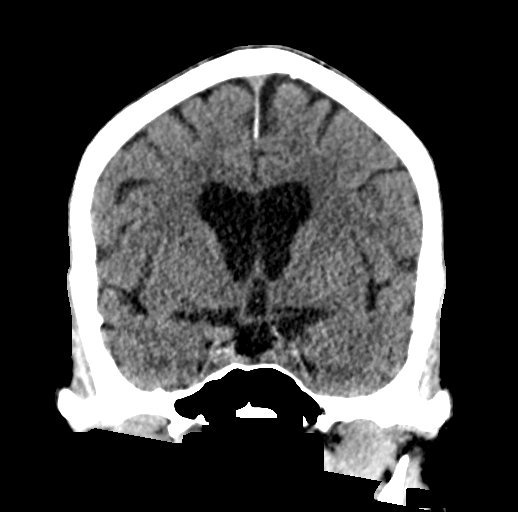
[im 34/73  brain]
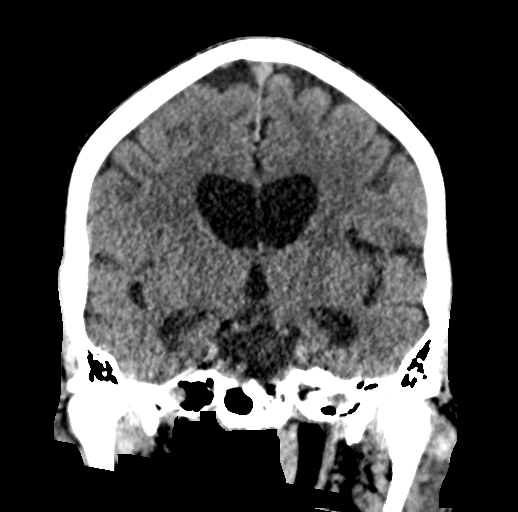
[im 39/73  brain]
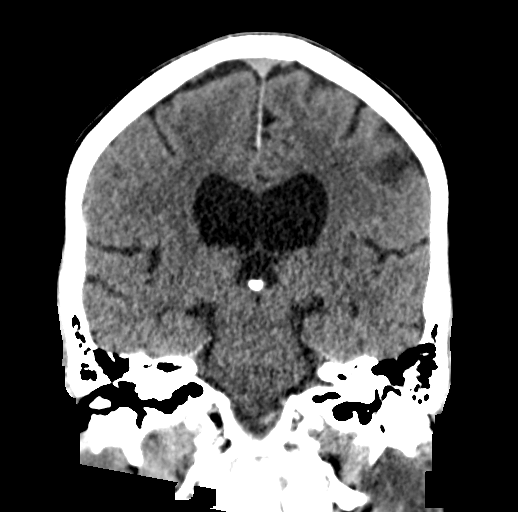

[Series 5: sagittal soft · sagittal · 0.36mm/px · 3 of 51 slices shown]
[im 19/51  brain]
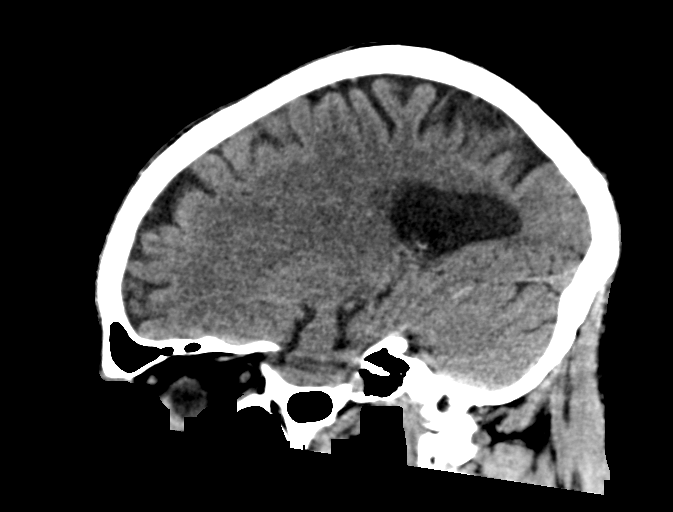
[im 26/51  brain]
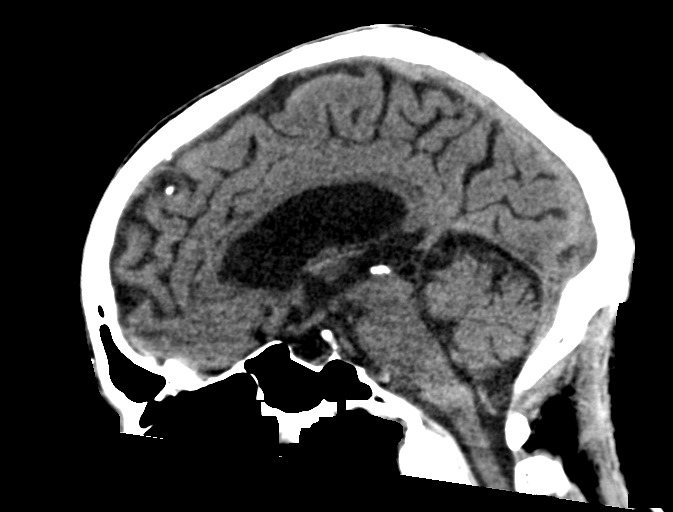
[im 33/51  brain]
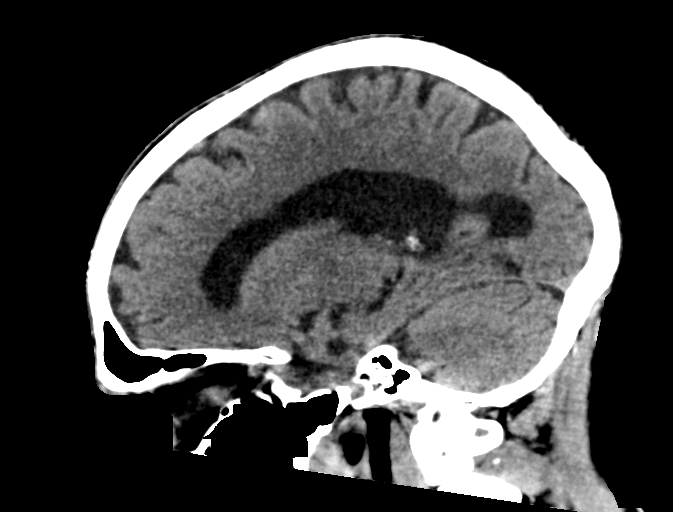

[17 of 47 positions shown; findings below may reference images not displayed]

FINDINGS: Brain: No evidence of acute infarction, hemorrhage, hydrocephalus,
extra-axial collection or mass lesion/mass effect.

Vascular: No hyperdense vessel or unexpected calcification.

Skull: Normal. Negative for fracture or focal lesion.

Sinuses/Orbits: Visualized globes and orbits are unremarkable are
clear. Visualized sinuses

Other: None.
IMPRESSION: 1. No acute intracranial abnormalities. No change from the prior
study.

## 2020-07-03 MED ORDER — SODIUM CHLORIDE 0.9 % IV BOLUS
500.0000 mL | Freq: Once | INTRAVENOUS | Status: AC
  Administered 2020-07-03: 500 mL via INTRAVENOUS

## 2020-07-03 NOTE — ED Provider Notes (Signed)
[facility]ERG[facility]Y DEPT Provider Note   CSN: [id number] Arrival date & time: 07/03/20  1752     History Chief Complaint  Patient presents with  . Altered Mental Status    Carlos [name] is a 75 y.o. male.  Presents to ER with concern for episode of confusion.  Patient's sister reports that patient seemed a little bit confused this morning, thinking that his brother who has died was still alive.  When questioned about this point of confusion, patient states he is unsure why he is confused but he does not think this anymore.  He denies any ongoing confusion.  Sister states patient is acting appropriately and at his baseline currently.  No difficulty with speech, swallowing.  No numbness, weakness, vision changes.  Denies cough, congestion, no fevers, chills.  No abdominal pain, dysuria, hematuria.  Has a history of colorectal cancer.  Had episode of mild nausea and small emesis yesterday but he denies any emesis or nausea today. HPI     Past Medical History:  Diagnosis Date  . Anxiety   . Benign prostatic hyperplasia 03/30/2013   10/1 IMO update  . 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 rectal ca dx'd 06/2016  . Seizures [facility])     Patient Active Problem List   Diagnosis Date Noted  . Abnormality of gait 02/01/2020  . Abnormal urinalysis   . Post-operative pain   . Left hip pain   . Closed nondisplaced intertrochanteric fracture of left femur ([place])   . Loose stools   . Hypoalbuminemia due to protein-calorie malnutrition ([place])   . Hypokalemia   . Acute blood loss anemia   . Seizures ([place])   . Seizure ([place]) 12/15/2019  . Pressure ulcer, stage 2 ([place]) 12/12/2019  . Closed comminuted intertrochanteric fracture of left femur ([place])   . Breakthrough seizure ([place]) 12/08/2019  . Acute encephalopathy 09/11/2019  . Fever 09/11/2019  . Port-A-Cath in place 09/11/2019  . 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 Mild neurocognitive disorder  of unclear etiology 01/23/2019  . Benign prostatic hyperplasia 03/30/2013    Past Surgical History:  Procedure Laterality Date  . COLON SURGERY    . HIP SURGERY    . INTRAMEDULLARY (IM) NAIL INTERTROCHANTERIC Left 12/11/2019   Procedure: INTRAMEDULLARY (IM) NAIL INTERTROCHANTRIC;  Surgeon: Rod Can, MD;  Location: [place];  Service: Orthopedics;  Laterality: Left;  . PORTACATH PLACEMENT N/A 07/01/2019   Procedure: PORT ULTRASOUND GUIDED PLACEMENT;  Surgeon: Alphonsa Overall, MD;  Location: [facility];  Service: General;  Laterality: N/A;  . PROSTATE SURGERY  2004  . RECTAL SURGERY    . TONSILECTOMY, ADENOIDECTOMY, BILATERAL MYRINGOTOMY AND TUBES         Family History  Problem Relation Age of Onset  . Osteoporosis Mother   . Dementia Mother   . Diabetes Mother   . Brain cancer Father     Social History   Tobacco Use  . Smoking status: Never Smoker  . Smokeless tobacco: Never Used  Vaping Use  . Vaping Use: Never used  Substance Use Topics  . Alcohol use: No  . Drug use: No    Home Medications Prior to Admission medications   Medication Sig Start Date End Date Taking? Authorizing Provider  levETIRAcetam (KEPPRA) 1000 MG tablet Take 1 tablet twice a day (take with Levetiracetam 250mg  tablet for total of 1250mg  twice a day) 04/22/20   Cameron Sprang, MD  levETIRAcetam (KEPPRA) 250 MG tablet Take 1 tablet twice a  day (take with Levetiracetam 1000mg  tablet for total of 1250mg  twice a day) 04/22/20   Cameron Sprang, MD  loperamide (IMODIUM) 2 MG capsule Take 1 capsule (2 mg total) by mouth every 6 (six) hours as needed for diarrhea or loose stools. 12/15/19   Terrilee Croak, MD  Multiple Vitamin (MULTIVITAMIN WITH MINERALS) TABS tablet Take 1 tablet by mouth daily.    [provider]  NON FORMULARY Take 1 capsule by mouth daily. Mind works    Secondary school teacher, Historical, MD  psyllium (METAMUCIL) 58.6 % packet Take 1 packet by mouth daily.    [provider]     Allergies    Patient has no known allergies.  Review of Systems   Review of Systems  Constitutional: Negative for chills and fever.  HENT: Negative for ear pain and sore throat.   Eyes: Negative for pain and visual disturbance.  Respiratory: Negative for cough and shortness of breath.   Cardiovascular: Negative for chest pain and palpitations.  Gastrointestinal: Negative for abdominal pain and vomiting.  Genitourinary: Negative for dysuria and hematuria.  Musculoskeletal: Negative for arthralgias and back pain.  Skin: Negative for color change and rash.  Neurological: Negative for seizures and syncope.  Psychiatric/Behavioral: Positive for confusion.  All other systems reviewed and are negative.   Physical Exam Updated Vital Signs BP (!) 124/59   Pulse 69   Temp 98.8 F (37.1 C) (Oral)   Resp 18   Ht 5\' 4"  (1.626 m)   Wt 57.2 kg   SpO2 100%   BMI 21.63 kg/m   Physical Exam Vitals and nursing note reviewed.  Constitutional:      Appearance: He is well-developed.  HENT:     Head: Normocephalic and atraumatic.  Eyes:     Conjunctiva/sclera: Conjunctivae normal.  Cardiovascular:     Rate and Rhythm: Normal rate and regular rhythm.     Heart sounds: No murmur heard.   Pulmonary:     Effort: Pulmonary effort is normal. No respiratory distress.     Breath sounds: Normal breath sounds.  Abdominal:     Palpations: Abdomen is soft.     Tenderness: There is no abdominal tenderness.  Musculoskeletal:     Cervical back: Neck supple.  Skin:    General: Skin is warm and dry.  Neurological:     Mental Status: He is alert.     Comments: AAOx3 CN 2-12 intact, speech clear visual fields intact 5/5 strength in b/l UE and LE Sensation to light touch intact in b/l UE and LE Normal FNF Normal gait     ED Results / Procedures / Treatments   Labs (all labs ordered are listed, but only abnormal results are displayed) Labs Reviewed  CBC WITH DIFFERENTIAL/PLATELET -  Abnormal; Notable for the following components:      Result Value   WBC 14.1 (*)    Hemoglobin 12.8 (*)    HCT 38.0 (*)    Neutro Abs 12.0 (*)    Monocytes Absolute 1.4 (*)    All other components within normal limits  COMPREHENSIVE METABOLIC PANEL - Abnormal; Notable for the following components:   Sodium 129 (*)    Chloride 92 (*)    Glucose, Bld 111 (*)    Creatinine, Ser 0.60 (*)    All other components within normal limits  AMMONIA - Abnormal; Notable for the following components:   Ammonia 40 (*)    All other components within normal limits  URINALYSIS, ROUTINE W REFLEX  MICROSCOPIC - Abnormal; Notable for the following components:   Color, Urine COLORLESS (*)    Specific Gravity, Urine <1.005 (*)    All other components within normal limits  PROTIME-INR  TROPONIN I (HIGH SENSITIVITY)  TROPONIN I (HIGH SENSITIVITY)    EKG None  Radiology DG Chest 2 View  Result Date: 07/03/2020 CLINICAL DATA:  Altered mental status.  Leukocytosis. EXAM: CHEST - 2 VIEW COMPARISON:  12/09/2019. FINDINGS: Cardiac silhouette is normal in size. No mediastinal or hilar masses or evidence of adenopathy. Prominent interstitial markings most evident in the bases. Lungs otherwise clear. No pleural effusion or pneumothorax. Right anterior chest wall Port-A-Cath is stable. Skeletal structures are demineralized. Mild to moderate compression deformity of an upper thoracic vertebra, stable compared to the CT dated 01/29/2020. IMPRESSION: No acute cardiopulmonary disease. Electronically Signed   By: Lajean Manes M.D.   On: 07/03/2020 20:38   CT Head Wo Contrast  Result Date: 07/03/2020 CLINICAL DATA:  Confusion. Altered mental status since yesterday around 11 a.m. EXAM: CT HEAD WITHOUT CONTRAST TECHNIQUE: Contiguous axial images were obtained from the base of the skull through the vertex without intravenous contrast. COMPARISON:  12/08/2019. FINDINGS: Brain: No evidence of acute infarction, hemorrhage,  hydrocephalus, extra-axial collection or mass lesion/mass effect. Vascular: No hyperdense vessel or unexpected calcification. Skull: Normal. Negative for fracture or focal lesion. Sinuses/Orbits: Visualized globes and orbits are unremarkable are clear. Visualized sinuses Other: None. IMPRESSION: 1. No acute intracranial abnormalities. No change from the prior study. Electronically Signed   By: Lajean Manes M.D.   On: 07/03/2020 20:09    Procedures Procedures   Medications Ordered in ED Medications  sodium chloride 0.9 % bolus 500 mL (500 mLs Intravenous New Bag/Given 07/03/20 2104)    ED Course  I have reviewed the triage vital signs and the nursing notes.  Pertinent labs & imaging results that were available during my care of the patient were reviewed by me and considered in my medical decision making (see chart for details).    MDM Rules/Calculators/A&P                          75 year old male presented to ER after having some mild confusion.  On exam in ER, patient appears quite well.  He denies any confusion and family member at bedside corroborates that he no longer is confused.  No specific neurologic deficits were appreciated on exam.  CT head was negative.  Basic labs were notable for mild leukocytosis as well as mild hyponatremia.  Sister reports patient's wife adheres to a very strict low salt diet, raising suspicion that the hyponatremia may be related to dietary changes.  Regarding leukocytosis, CXR was negative, UA negative.  Patient denies any acute infectious symptoms.  Ammonia noted to be borderline elevated.  Given patient is currently acting appropriately and has no ongoing confusion and has stable vital signs, believe he is stable for discharge and management in the outpatient setting.  I discussed lab work in detail with patient and family member at bedside.  Recommended close recheck with primary doctor and monitoring of levels.  Reviewed strict return precautions and  ultimately discharged home.  After the discussed management above, the patient was determined to be safe for discharge.  The patient was in agreement with this plan and all questions regarding their care were answered.  ED return precautions were discussed and the patient will return to the ED with any significant worsening of condition.  Final Clinical Impression(s) / ED Diagnoses Final diagnoses:  Hyponatremia  Leukocytosis, unspecified type    Rx / DC Orders ED Discharge Orders    None       Lucrezia Starch, MD 07/03/20 2153

## 2020-07-03 NOTE — ED Triage Notes (Signed)
Pt arrives to ED with c/o of confusion and altered mental status since yesterday starting around 11am. Per pts sister pt has been thinking his brother who passed away years ago was at the house today and  pt has forgotten the day of the week as he waiting waiting for the mail this morning. Pt reports that he has had emesis over the past three days. Pt admits to feeling confused. Pt denies CP, SOB, abd pain, fevers, and chills. Pt with hx of colorectal cancer had colonoscopy reversed in April.

## 2020-07-03 NOTE — ED Notes (Signed)
This RN presented the AVS utilizing Teachback Method. Patient verbalizes understanding of Discharge Instructions. Opportunity for Questioning and Answers were provided. Patient Discharged from ED ambulatory to Home with Family Member.

## 2020-07-03 NOTE — Discharge Instructions (Addendum)
Please follow-up with your primary care doctor in the next couple days for close recheck.  Recommend having both your sodium and your white blood cell count monitored by your primary care doctor.  Additionally, if he has any recurrence of confusion, chest pain, difficulty breathing, fever, vomiting or other new concerning symptom, come back to ER for reassessment.  For the next few days or until further instructed by your primary care doctor, recommend adding small amount of salt to your meals.

## 2020-07-05 ENCOUNTER — Other Ambulatory Visit: Payer: Self-pay

## 2020-07-05 ENCOUNTER — Other Ambulatory Visit: Payer: 59

## 2020-07-05 ENCOUNTER — Ambulatory Visit (HOSPITAL_COMMUNITY)
Admission: RE | Admit: 2020-07-05 | Discharge: 2020-07-05 | Disposition: A | Discharge status: Home / Self Care | Payer: 59 | Source: Ambulatory Visit | Attending: Hematology | Admitting: Hematology

## 2020-07-05 ENCOUNTER — Inpatient Hospital Stay: Payer: 59 | Attending: Nurse Practitioner

## 2020-07-05 DIAGNOSIS — C20 Malignant neoplasm of rectum: Secondary | ICD-10-CM | POA: Diagnosis not present

## 2020-07-05 DIAGNOSIS — Z95828 Presence of other vascular implants and grafts: Secondary | ICD-10-CM

## 2020-07-05 LAB — CMP (CANCER CENTER ONLY)
ALT: 28 U/L (ref 0–44)
AST: 30 U/L (ref 15–41)
Albumin: 3.3 g/dL — ABNORMAL LOW (ref 3.5–5.0)
Alkaline Phosphatase: 73 U/L (ref 38–126)
Anion gap: 10 (ref 5–15)
BUN: 9 mg/dL (ref 8–23)
CO2: 26 mmol/L (ref 22–32)
Calcium: 9 mg/dL (ref 8.9–10.3)
Chloride: 99 mmol/L (ref 98–111)
Creatinine: 0.65 mg/dL (ref 0.61–1.24)
GFR, Estimated: >60 mL/min (ref >60)
Glucose, Bld: 105 mg/dL — ABNORMAL HIGH (ref 70–99)
Potassium: 4 mmol/L (ref 3.5–5.1)
Sodium: 135 mmol/L (ref 135–145)
Total Bilirubin: 0.8 mg/dL (ref 0.3–1.2)
Total Protein: 6.6 g/dL (ref 6.5–8.1)

## 2020-07-05 LAB — CBC WITH DIFFERENTIAL (CANCER CENTER ONLY)
Abs Immature Granulocytes: 0.06 10*3/uL (ref 0.00–0.07)
Basophils Absolute: 0 10*3/uL (ref 0.0–0.1)
Basophils Relative: 0 %
Eosinophils Absolute: 0.1 10*3/uL (ref 0.0–0.5)
Eosinophils Relative: 1 %
HCT: 37.7 % — ABNORMAL LOW (ref 39.0–52.0)
Hemoglobin: 12.1 g/dL — ABNORMAL LOW (ref 13.0–17.0)
Immature Granulocytes: 1 %
Lymphocytes Relative: 9 %
Lymphs Abs: 1.1 10*3/uL (ref 0.7–4.0)
MCH: 29 pg (ref 26.0–34.0)
MCHC: 32.1 g/dL (ref 30.0–36.0)
MCV: 90.4 fL (ref 80.0–100.0)
Monocytes Absolute: 1.5 10*3/uL — ABNORMAL HIGH (ref 0.1–1.0)
Monocytes Relative: 12 %
Neutro Abs: 9.8 10*3/uL — ABNORMAL HIGH (ref 1.7–7.7)
Neutrophils Relative %: 77 %
Platelet Count: 175 10*3/uL (ref 150–400)
RBC: 4.17 MIL/uL — ABNORMAL LOW (ref 4.22–5.81)
RDW: 13.5 % (ref 11.5–15.5)
WBC Count: 12.5 10*3/uL — ABNORMAL HIGH (ref 4.0–10.5)
nRBC: 0 % (ref 0.0–0.2)

## 2020-07-05 IMAGING — CT CT CHEST-ABD-PELV W/O CM
2 of 4 series · 13 of 36 positions shown, 15 images · non-contrast
Comparison: CT [DATE].

CLINICAL DATA: Follow-up of colorectal cancer diagnosed in [[place]]
with history of colostomy and reversal in [place].

EXAM:
CT CHEST, ABDOMEN AND PELVIS WITHOUT CONTRAST
TECHNIQUE: Multidetector CT imaging of the chest, abdomen and pelvis was
performed following the standard protocol without IV contrast.

[Series 2: cap w/o · axial · non-contrast · 0.83mm/px · z∈[-278,+212]mm · 10 of 118 slices shown, 12 images]
[im 10/118  mediastinal]
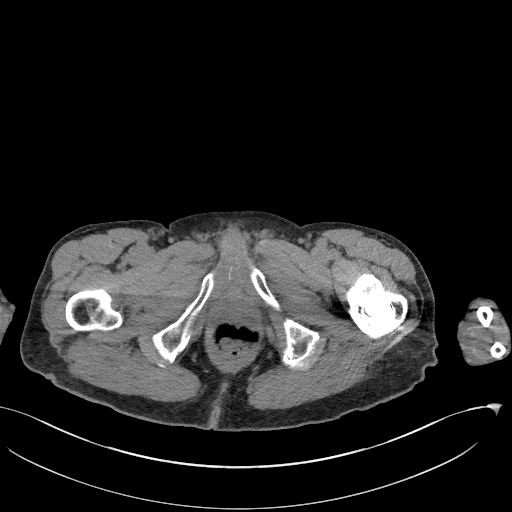
[im 10/118  bone]
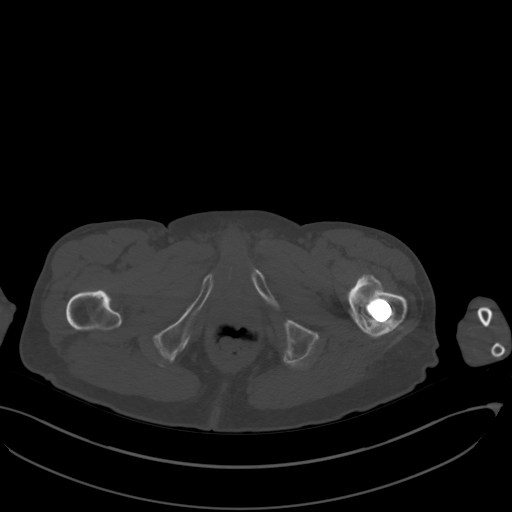
[im 20/118  mediastinal]
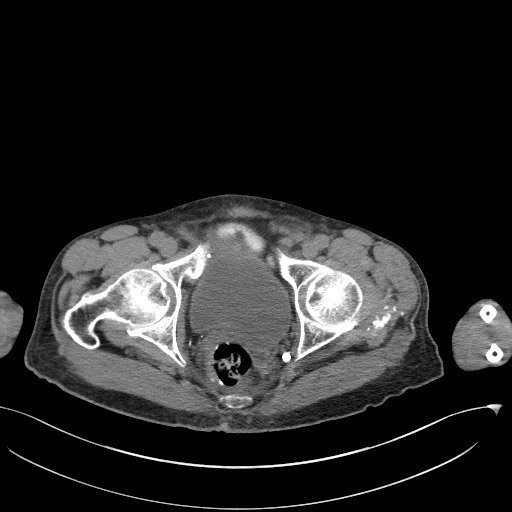
[im 30/118  mediastinal]
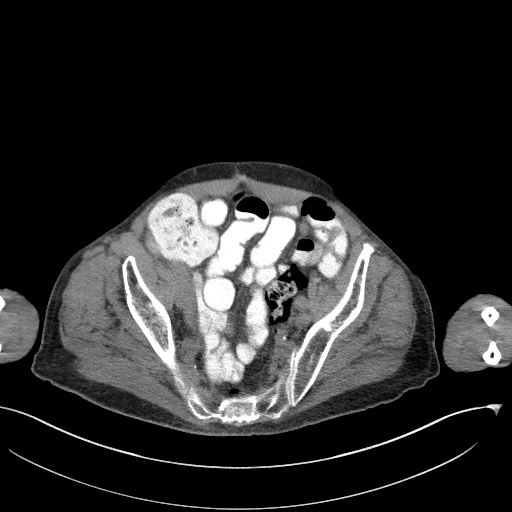
[im 40/118  mediastinal]
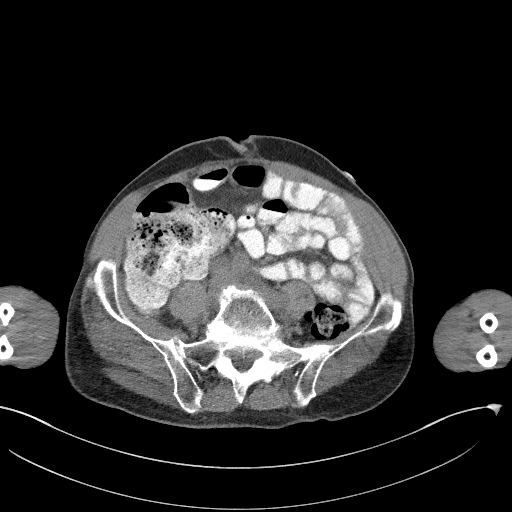
[im 49/118  mediastinal]
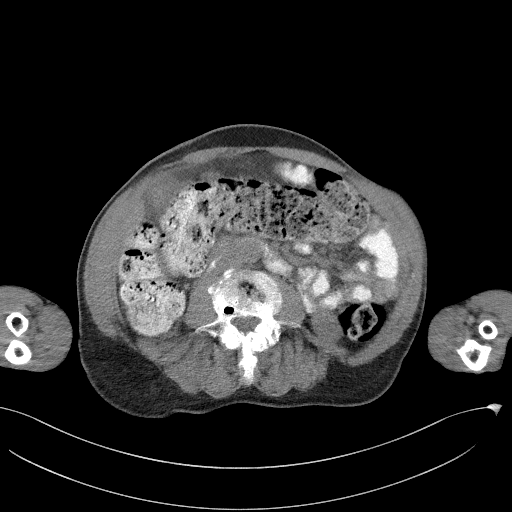
[im 69/118  mediastinal]
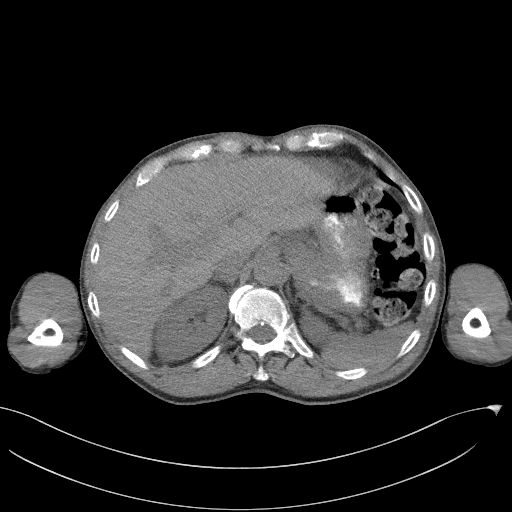
[im 79/118  mediastinal]
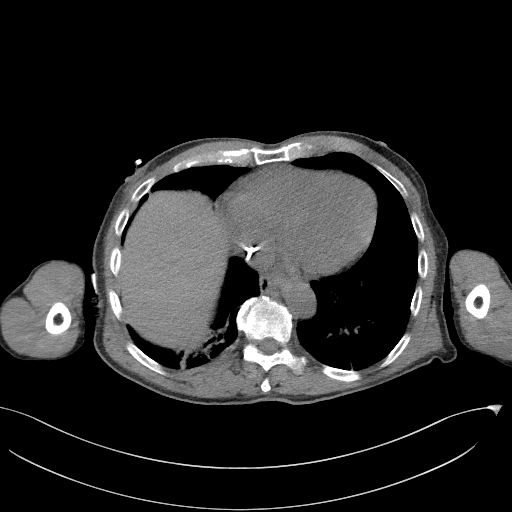
[im 88/118  mediastinal]
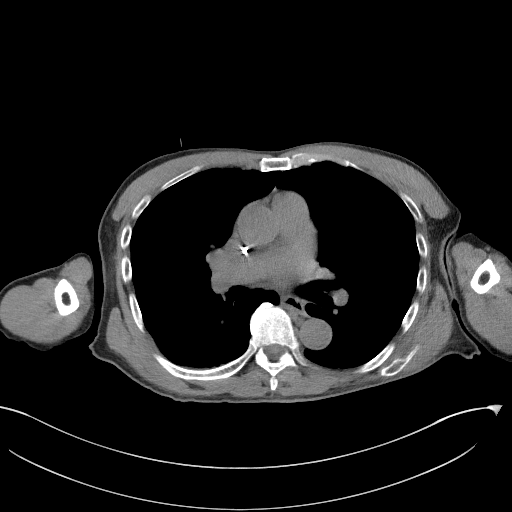
[im 98/118  mediastinal]
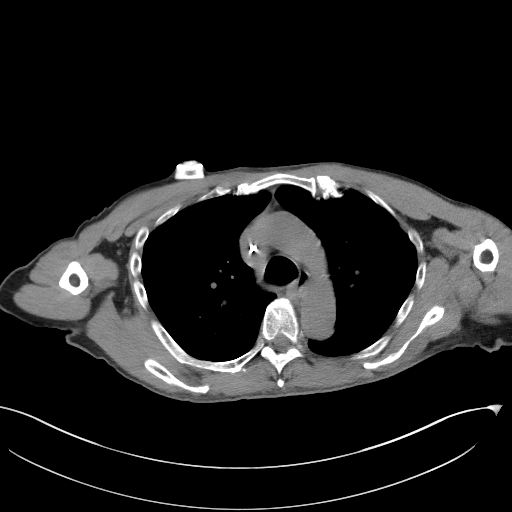
[im 98/118  bone]
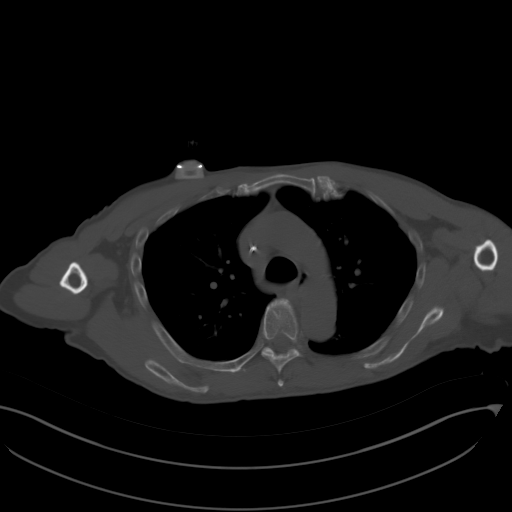
[im 108/118  mediastinal]
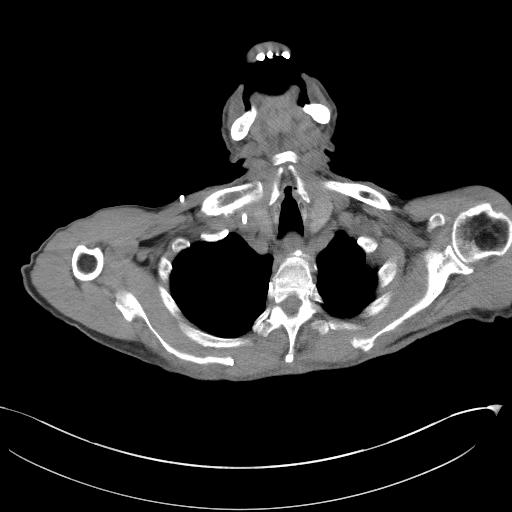

[Series 5: coronals · coronal · 0.78mm/px · 3 of 137 slices shown]
[im 28/137  mediastinal]
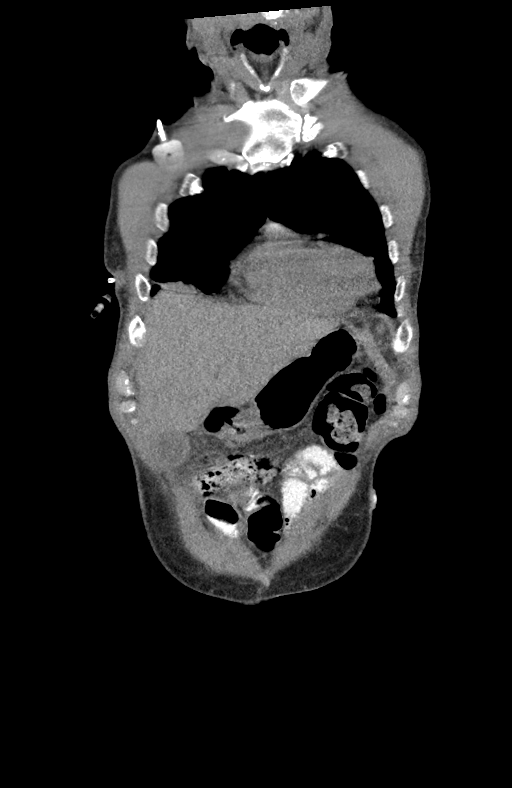
[im 55/137  mediastinal]
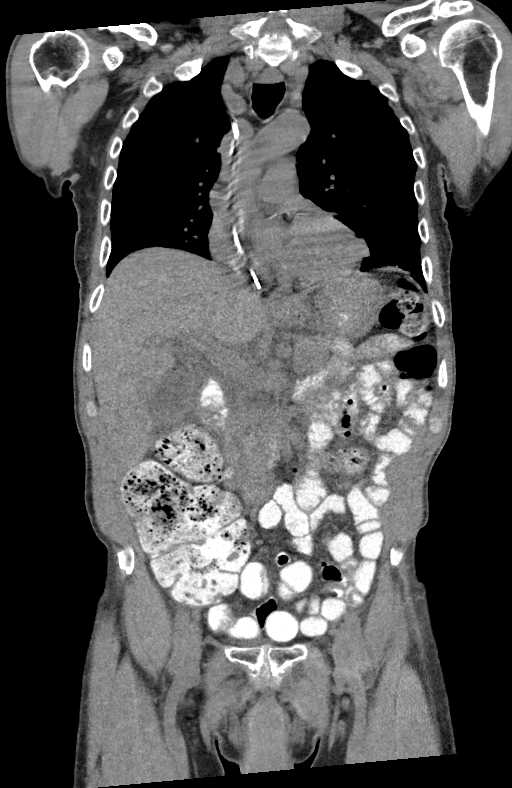
[im 82/137  mediastinal]
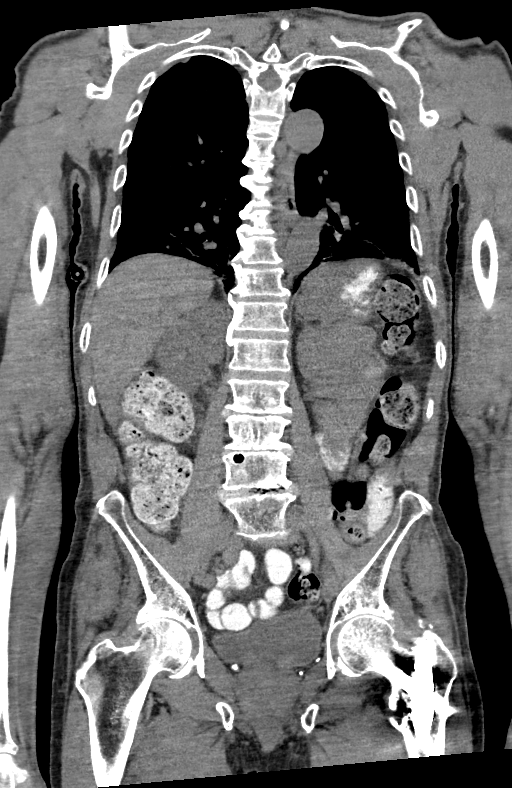

[13 of 36 positions shown; findings below may reference images not displayed]

FINDINGS: CT CHEST FINDINGS

Cardiovascular: Normal heart size without substantial pericardial
effusion. RIGHT-sided Port-A-Cath terminates in the IVC as before
and or lower RIGHT atrium. Position again is unchanged grossly
compared to the prior study. Central pulmonary vasculature is
grossly stable. Limited assessment of cardiovascular structures
given lack of intravenous contrast.

Mediastinum/Nodes: Thoracic inlet structures are unremarkable. No
axillary lymphadenopathy. No mediastinal or hilar lymphadenopathy.
Mildly patulous esophagus.

Lungs/Pleura: Basilar atelectasis without dense consolidative
changes or evidence of pleural effusion. 4 mm nodule in the superior
segment of the RIGHT lower lobe is unchanged. Airways are patent.

Musculoskeletal: Visualized clavicles and scapulae are intact. No
destructive rib lesions. See below for full musculoskeletal details.

CT ABDOMEN PELVIS FINDINGS

Hepatobiliary: Normal contour and size of the liver without gross
lesion. Mildly indistinct appearance of the fat and gallbladder wall
separation in the upper abdomen but with some respiratory motion.
Numerous gallstones are present in the gallbladder lumen.

Pancreas: Pancreas with smooth contours. The mildly indistinct
margins of the fat in the upper abdomen, of uncertain significance.
No gross peripancreatic stranding or fluid.

Spleen: Normal size spleen.

Adrenals/Urinary Tract: Adrenal glands are normal.

Symmetric renal enhancement. The lesion arising from the anterior
RIGHT kidney shown to enhance on the prior study measures
approximately 2.4 x 2.0 cm and is grossly unchanged.

Intermediate density to higher density lesion with homogeneous
characteristics in the interpolar LEFT kidney measures 62 Hounsfield
units and 2.3 cm, unchanged based on size compared to the prior
study. No hydronephrosis. No nephrolithiasis. Urinary bladder with
smooth contours.

Stomach/Bowel: No signs of bowel obstruction though there is a large
amount of stool in the ascending colon and in the transverse colon,
formed stool. Lesser distension of the more distal colon with stool
and gas in the lumen. No pericolonic stranding. Sigmoid diverticular
disease with signs of colorectal anastomosis in the pelvis following
resection of rectal neoplasm

Vascular/Lymphatic: Normal caliber abdominal aorta. Smooth contour
of the IVC. There is no gastrohepatic or hepatoduodenal ligament
lymphadenopathy. No retroperitoneal or mesenteric lymphadenopathy.

Reproductive: Prostate unremarkable by CT grossly, not well
assessed.

Other: Signs of prior ostomy in the RIGHT hemiabdomen. Small midline
periumbilical hernia containing fat similar to previous imaging.

Musculoskeletal: Spinal degenerative changes. No acute or
destructive bone process. Post ORIF of the LEFT femur partially
imaged.
IMPRESSION: 1. Mildly indistinct appearance of the fat and gallbladder wall
separation in the upper abdomen, of uncertain significance and in
the setting of abundant gallstones in the gallbladder lumen.
Correlate with any clinical signs of abdominal pain and consider
further evaluation with ultrasound as warranted.
2. RIGHT-sided Port-A-Cath tip at the high IVC near the
diaphragmatic hiatus. Could consider repositioning as warranted.
3. Suspected solid renal neoplasm arising from the anterior RIGHT
kidney. Consider follow-up evaluation, renal protocol study with
with MRI with and without contrast as outlined in previous imaging.
Hemorrhagic and/or proteinaceous cysts elsewhere in the kidneys.
4. LEFT nephrolithiasis as before with lower pole calculus
approximately 6 mm.
5. No signs of metastatic disease with abundant stool in the colon
following partial colonic resection. Correlate with any signs of
constipation.
6. 4 mm nodule in the superior segment of the RIGHT lower lobe is
unchanged. Attention on follow-up
7. Small midline periumbilical hernia containing fat.
8. Aortic atherosclerosis.

These results will be called to the ordering clinician or
representative by the Radiologist Assistant, and communication
documented in the PACS or [REDACTED].

Aortic Atherosclerosis ([BZ]-[BZ]).

## 2020-07-05 MED ORDER — SODIUM CHLORIDE 0.9% FLUSH
10.0000 mL | Freq: Once | INTRAVENOUS | Status: AC
  Administered 2020-07-05: 10 mL
  Filled 2020-07-05: qty 10

## 2020-07-05 MED ORDER — HEPARIN SOD (PORK) LOCK FLUSH 100 UNIT/ML IV SOLN
500.0000 [IU] | Freq: Once | INTRAVENOUS | Status: AC
  Administered 2020-07-05: 500 [IU]
  Filled 2020-07-05: qty 5

## 2020-07-05 NOTE — Addendum Note (Signed)
Addended by: Bertram Millard A on: 07/05/2020 11:44 AM   Modules accepted: Orders

## 2020-07-07 ENCOUNTER — Telehealth: Payer: Self-pay | Admitting: Hematology

## 2020-07-07 ENCOUNTER — Other Ambulatory Visit: Payer: Self-pay

## 2020-07-07 ENCOUNTER — Encounter: Payer: Self-pay | Admitting: Hematology

## 2020-07-07 ENCOUNTER — Inpatient Hospital Stay: Payer: 59 | Attending: Hematology | Admitting: Hematology

## 2020-07-07 VITALS — BP 132/72 | HR 76 | Temp 97.6°F | Resp 18 | Ht 64.0 in | Wt 126.6 lb

## 2020-07-07 DIAGNOSIS — G3184 Mild cognitive impairment, so stated: Secondary | ICD-10-CM | POA: Diagnosis not present

## 2020-07-07 DIAGNOSIS — R197 Diarrhea, unspecified: Secondary | ICD-10-CM | POA: Diagnosis not present

## 2020-07-07 DIAGNOSIS — C20 Malignant neoplasm of rectum: Secondary | ICD-10-CM | POA: Diagnosis not present

## 2020-07-07 DIAGNOSIS — Z95828 Presence of other vascular implants and grafts: Secondary | ICD-10-CM | POA: Diagnosis not present

## 2020-07-07 NOTE — Progress Notes (Signed)
Carlos [name]   [phone] [phone]   Clinic Follow up Note   Patient Care Team: [name], MD as PCP - General (Family Medicine) Cameron Sprang, MD as Consulting Physician (Neurology) Truitt Merle, MD as Consulting Physician (Hematology) Ronnette Juniper, MD as Consulting Physician (Gastroenterology) Elease Hashimoto (Neurology)  Date of Service:  07/07/2020  CHIEF COMPLAINT: f/u of rectal cancer  SUMMARY OF ONCOLOGIC HISTORY: Oncology History Overview Note  Cancer Staging Rectal adenocarcinoma [facility]) Staging form: Colon and Rectum, AJCC 8th Edition - Clinical stage from 06/12/2019: cT4, cN1 - Unsigned Stage prefix: Initial diagnosis - Pathologic stage from 02/08/2020: Stage I (ypT1, pN0, cM0) - Signed by Truitt Merle, MD on 04/06/2020 Stage prefix: Post-therapy Total positive nodes: 0 Residual tumor (R): R0 - None    Rectal adenocarcinoma ([place])  05/29/2019 Procedure   Colonoscopy per Dr. Therisa Doyne A digital rectal exam was normal  frond-like fungating infiltrative non-obstructing large mass in the rectum. The mass was partially circumferential, measured 5 cm in length. The colonoscopy also showed 11 sessile polyps in the transverse and asecnding colon and hepatic flexure.    05/29/2019 Initial Biopsy   Pathology on the rectal biopsy showed at least intramucosal adenocarcinoma involving tubulovillous adenoma with high grade dysplasia.   06/05/2019 Imaging   CT CAP IMPRESSION: 1. Cholelithiasis and gallbladder wall thickening with equivocal pericholecystic inflammation. Acute cholecystitis not excluded. If there is clinical suspicion for acute cholecystitis, recommend ultrasound evaluation. 2. Approximately 5 cm irregular mass within the distal sigmoid colon with possible extension through the wall. No definite abnormal adjacent lymph nodes. 3. Mild omental haziness-nonspecific. Metastatic disease not excluded. 4. Indeterminate 3-4 mm RIGHT  pulmonary nodules which could represent metastatic disease. These are too small for PET CT evaluation. Consider CT follow-up. 5. 2.5 cm irregular cystic mass within the RIGHT kidney. Elective MRI recommended for further evaluation. 6. 4 mm nonobstructing LEFT LOWER pole renal calculus. 7. Coronary artery disease. 8. Aortic Atherosclerosis (ICD10-I70.0).   06/12/2019 Imaging   Staging MRI pelvis  FINDINGS: TUMOR LOCATION Tumor distance from Anal Verge/Skin Surface:  11.4 cm Tumor distance to Internal Anal Sphincter: 6.5 cm TUMOR DESCRIPTION Circumferential Extent: 100% Tumor Length: 8.3 cm T - CATEGORY Extension through Muscularis Propria: Yes>6mm=T3d Shortest Distance of any tumor/node from Mesorectal Fascia: 0 mm, along the left lateral and posterior walls (tumor abuts the left lateral pelvic sidewall and sacrum) Extramural Vascular Invasion/Tumor Thrombus: No Invasion of Anterior Peritoneal Reflection: No Involvement of Adjacent Organs or Pelvic Sidewall: Yes, involving the left lateral pelvic sidewall =T4 Levator Ani Involvement: No N - CATEGORY Mesorectal Lymph Nodes >=13mm: 2=N1 Extra-mesorectal Lymphadenopathy: No Other:  None. IMPRESSION: Rectal adenocarcinoma T stage: T4 Rectal adenocarcinoma N stage:  N1 Distance from tumor to the internal anal sphincter is 6.5 cm.   06/18/2019 Initial Diagnosis   Rectal adenocarcinoma ([place])   07/02/2019 - 10/08/2019 Chemotherapy   Total Neoadjuvant FOLFOX q2weeks starting 07/02/19-10/08/19,  8 cycles   07/13/2019 PET scan   IMPRESSION: 1. Known rectal mass is intensely hypermetabolic compatible with primary rectal adenocarcinoma. No findings of FDG avid nodal metastasis or distant metastatic disease. 2. Hyperdense kidney cysts are identified bilaterally favored to represent hemorrhagic cyst. 3. Nonobstructing left renal calculi. 4. Aortic atherosclerosis, in addition to lad coronary artery disease. Please note that although the  presence of coronary artery calcium documents the presence of coronary artery disease, the severity of this disease and any potential stenosis cannot be assessed on this non-gated CT  examination. Assessment for potential risk factor modification, dietary therapy or pharmacologic therapy may be warranted, if clinically indicated. Aortic Atherosclerosis (ICD10-I70.0). 5. Gallstones.   09/11/2019 - 09/14/2019 Hospital Admission   Acute AMS, seizure 09/11/19 -Developed acute altered mental status day after Cycle 6 chemo (09/11/19).  -He was admitted for work up, EEG showed epileptic spike. ID work up negative, MRI negative for metastasis.    09/11/2019 Imaging   MRI Brain  IMPRESSION: Incomplete study. Images obtained reveal no acute abnormality. Negative for acute infarct.   Atrophy and mild ventricular enlargement stable from prior studies.   09/11/2019 Imaging   CT head  IMPRESSION: Mild ventricular prominence with normal appearing sulci. Question early communicating hydrocephalus. Brain parenchyma appears unremarkable. No acute infarct. No mass or hemorrhage.   Foci of arterial vascular calcification noted. There is mucosal thickening in several ethmoid air cells.   There is probable cerumen in the right external auditory canal.     09/23/2019 Imaging   CT AP  IMPRESSION: 1. Solid-appearing left eccentric upper rectal mass measures about 6.7 by 2.9 cm. This is difficult to compare directly to the prior exam given differences in orientation and degree of distension of the rectum. There is some prominence of stool in the sigmoid colon and rectum on today's examination, but the mass is not obstructive. 2. Other imaging findings of potential clinical significance: Cholelithiasis. Complex bilateral renal lesions are similar to prior exams. Nonobstructive left nephrolithiasis. Mild prostatomegaly with nodular prominence the upper margin of the prostate gland. Lumbar spondylosis and  degenerative disc disease causing multilevel foraminal impingement. 3. Aortic atherosclerosis.   Aortic Atherosclerosis (ICD10-I70.0).   11/02/2019 - 12/09/2019 Chemotherapy   Concurrent chemo RT with Oral Xeloda 1500mg  BID on days of RT starting 11/02/19-12/09/19   11/02/2019 - 12/09/2019 Radiation Therapy   Concurrent chemo RT by Dr Lisbeth Renshaw with Xeloda  starting 11/02/19-12/09/19   01/13/2020 Imaging   MRI at [facility] 1.  Reduced size of the previously demonstrated high rectal mass. The residual tumor measures 2.4 cm in maximal diameter and is primarily endoluminal. There is a similar amount of extramural disease spread into the left mesorectal space and presacral space.  2.  Improved appearance of lymphadenopathy with several conspicuous mesorectal and internal iliac lymph nodes.  3.  Small volume of nonspecific free fluid in the pelvis.   01/29/2020 Imaging   CT CAP  IMPRESSION: 1. Interval response to therapy of rectosigmoid junction primary. 2. No findings of metastatic disease within the chest, abdomen, or pelvis. 3. Small pulmonary nodules, similar and decreased as detailed above. Nonspecific. 4. Right-sided Port-A-Cath tip at high IVC. consider repositioning. 5. Cholelithiasis. 6. Left nephrolithiasis. 7. Bilateral renal lesions. A medial upper pole renal lesion is suspicious for renal cell carcinoma. This could be re-evaluated at follow-up or more entirely characterized with routine outpatient pre and post contrast abdominal MRI.   02/08/2020 Surgery   LOWER ANTERIOR RESECTION RECTUM LAPAROSCOPIC by Dr Morton Stall at [facility]   Procedures   PR LAP,SURG,COLECTOMY, PARTIAL, W/ANAST   PR LAP, SURG MOBIL SPLENIC FL DUR PTL COLECTOMY   PR LAP, SURG ILEO/JEJUNO-STOMY   SIGMOID COLECTOMY LAPAROSCOPIC ASSISTED     02/08/2020 Pathology Results   Final Pathologic Diagnosis at [place], "IMA", EXCISION:              Two lymph nodes are negative for metastatic carcinoma  (0/2).   B.  SIGMOID COLON AND RECTUM, LOWER ANTERIOR RESECTION: Residual  well differentiated invasive adenocarcinoma of the rectum, arising from a tubulovillous adenoma with high grade dyplasia.               Size: 2 mm in greatest dimension.  Tumor invades the submucosa.  Negative for perineural and lymphovascular invasion. Resection margins are negative for malignancy or dysplasia.  Twenty-two lymph nodes are negative for metastatic carcinoma (0/22). Complete mesorectum.               See CAP cancer protocol summary and comment.   C.  PROXIMAL DOUGHNUT, RESECTION:                      Segment of benign colonic tissue.              Negative for malignancy or dyplasia.    D.  DISTAL DOUGHNUT, RESECTION:                            Segment of benign colonic tissue.              Negative for malignancy or dyplasia.    PATHOLOGIC STAGE CLASSIFICATION (pTNM, AJCC 8th Edition)         TNM Descriptors:    y (post-treatment)     Primary Tumor (pT):    pT1     Regional Lymph Nodes (pN):    pN0      02/08/2020 Cancer Staging   Staging form: Colon and Rectum, AJCC 8th Edition - Pathologic stage from 02/08/2020: Stage I (ypT1, pN0, cM0) - Signed by Truitt Merle, MD on 04/06/2020 Stage prefix: Post-therapy Total positive nodes: 0 Residual tumor (R): R0 - None   07/05/2020 Imaging   CT C/A/P w/o contrast  IMPRESSION: 1. Mildly indistinct appearance of the fat and gallbladder wall separation in the upper abdomen, of uncertain significance and in the setting of abundant gallstones in the gallbladder lumen. Correlate with any clinical signs of abdominal pain and consider further evaluation with ultrasound as warranted. 2. RIGHT-sided Port-A-Cath tip at the high IVC near the diaphragmatic hiatus. Could consider repositioning as warranted. 3. 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 Consider follow-up evaluation, renal protocol study with with MRI with and without  contrast as outlined in previous imaging. Hemorrhagic and/or proteinaceous cysts elsewhere in the kidneys. 4. LEFT nephrolithiasis as before with lower pole calculus approximately 6 mm. 5. No signs of metastatic disease with abundant stool in the colon following partial colonic resection. Correlate with any signs of constipation. 6. 4 mm nodule in the superior segment of the RIGHT lower lobe is unchanged. Attention on follow-up 7. Small midline periumbilical hernia containing fat. 8. Aortic atherosclerosis.      CURRENT THERAPY:  Surveillance  INTERVAL HISTORY:  Carlos [name] is here for a follow up of rectal cancer. He was last seen by me on 04/06/20. He presents to the clinic accompanied by his wife. He notes he is eating better more consistently. His wife notes he sometimes has "blowouts" since his ostomy reversal. He also reports a lot of gas and was recommended to take Gas-X. He reports switching between diarrhea and regular. This has regulated more recently since being prescribed medicine by Dr. Morton Stall.   All other systems were reviewed with the patient and are negative.  MEDICAL HISTORY:  Past Medical History:  Diagnosis Date  . Anxiety   . Benign prostatic hyperplasia 03/30/2013   10/1 IMO update  .  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 rectal ca dx'd 06/2016  . Seizures ([place])     SURGICAL HISTORY: Past Surgical History:  Procedure Laterality Date  . COLON SURGERY    . HIP SURGERY    . INTRAMEDULLARY (IM) NAIL INTERTROCHANTERIC Left 12/11/2019   Procedure: INTRAMEDULLARY (IM) NAIL INTERTROCHANTRIC;  Surgeon: Rod Can, MD;  Location: [place];  Service: Orthopedics;  Laterality: Left;  . PORTACATH PLACEMENT N/A 07/01/2019   Procedure: PORT ULTRASOUND GUIDED PLACEMENT;  Surgeon: Alphonsa Overall, MD;  Location: [facility];  Service: General;  Laterality: N/A;  . PROSTATE SURGERY  2004  . RECTAL SURGERY    . TONSILECTOMY, ADENOIDECTOMY, BILATERAL  MYRINGOTOMY AND TUBES      I have reviewed the social history and family history with the patient and they are unchanged from previous note.  ALLERGIES:  has No Known Allergies.  MEDICATIONS:  Current Outpatient Medications  Medication Sig Dispense Refill  . levETIRAcetam (KEPPRA) 1000 MG tablet Take 1 tablet twice a day (take with Levetiracetam 250mg  tablet for total of 1250mg  twice a day) 180 tablet 3  . levETIRAcetam (KEPPRA) 250 MG tablet Take 1 tablet twice a day (take with Levetiracetam 1000mg  tablet for total of 1250mg  twice a day) 180 tablet 3  . loperamide (IMODIUM) 2 MG capsule Take 1 capsule (2 mg total) by mouth every 6 (six) hours as needed for diarrhea or loose stools. 30 capsule 0  . Multiple Vitamin (MULTIVITAMIN WITH MINERALS) TABS tablet Take 1 tablet by mouth daily.    . NON FORMULARY Take 1 capsule by mouth daily. Mind works    . psyllium (METAMUCIL) 58.6 % packet Take 1 packet by mouth daily.     No current facility-administered medications for this visit.    PHYSICAL EXAMINATION: ECOG PERFORMANCE STATUS: 1 - Symptomatic but completely ambulatory  Vitals:   07/07/20 1440  BP: 132/72  Pulse: 76  Resp: 18  Temp: 97.6 F (36.4 C)  SpO2: 100%   Filed Weights   07/07/20 1440  Weight: 126 lb 9.6 oz (57.4 kg)    GENERAL:alert, no distress and comfortable SKIN: skin color, texture, turgor are normal, no rashes or significant lesions EYES: normal, Conjunctiva are pink and non-injected, sclera clear  NECK: supple, thyroid normal size, non-tender, without nodularity LYMPH:  no palpable lymphadenopathy in the cervical, axillary LUNGS: clear to auscultation and percussion with normal breathing effort HEART: regular rate & rhythm and no murmurs and no lower extremity edema ABDOMEN:abdomen soft, non-tender and normal bowel sounds Musculoskeletal:no cyanosis of digits and no clubbing  NEURO: alert & oriented x 3 with fluent speech, no focal motor/sensory  deficits  LABORATORY DATA:  I have reviewed the data as listed CBC Latest Ref Rng & Units 07/05/2020 07/03/2020 04/06/2020  WBC 4.0 - 10.5 K/uL 12.5(H) 14.1(H) 3.4(L)  Hemoglobin 13.0 - 17.0 g/dL 12.1(L) 12.8(L) 12.3(L)  Hematocrit 39.0 - 52.0 % 37.7(L) 38.0(L) 36.4(L)  Platelets 150 - 400 K/uL 175 187 189     CMP Latest Ref Rng & Units 07/05/2020 07/03/2020 04/06/2020  Glucose 70 - 99 mg/dL 105(H) 111(H) 96  BUN 8 - 23 mg/dL 9 12 14   Creatinine 0.61 - 1.24 mg/dL 0.65 0.60(L) 0.74  Sodium 135 - 145 mmol/L 135 129(L) 135  Potassium 3.5 - 5.1 mmol/L 4.0 4.4 4.4  Chloride 98 - 111 mmol/L 99 92(L) 106  CO2 22 - 32 mmol/L 26 30 23   Calcium 8.9 - 10.3 mg/dL 9.0 9.3 9.6  Total Protein  6.5 - 8.1 g/dL 6.6 6.7 7.2  Total Bilirubin 0.3 - 1.2 mg/dL 0.8 0.8 0.6  Alkaline Phos 38 - 126 U/L 73 67 100  AST 15 - 41 U/L 30 29 25   ALT 0 - 44 U/L 28 26 30       RADIOGRAPHIC STUDIES: I have personally reviewed the radiological images as listed and agreed with the findings in the report. No results found.   ASSESSMENT & PLAN:  ZAVIYAR RAHAL is a 75 y.o. male with   1. Adenocarcinoma of the rectum, cT4N1M0 stage III, ypT1aN0  -He was initially diagnosed in 4/2021with locally advanced stage III rectal cancer.  -Hewas treated with total neoadjvant chemotherapy FOLFOX 07/02/19-9/2/21andconcurrent chemoRT with Xeloda1500mg  BIDfor 5-6 weeks 11/02/19-12/07/19.He missed last 2 treatments due to hospitalization for Seizure and left femur fracture.  -He underwent low anterior resection surgery with Dr Morton Stall at [facility] on 02/08/20. Surgical pathology showed complete surgical resection with mild T1 residual disease, LN negative.  -His surveillance plan will consist of a physical exam and lab test (including CBC, CMP and CEA) every 3 months for the first 2 years, then every 6-12 months, colonoscopy in one year, and surveilliance CT scan every 6-12 month for up to 5 year.  -He underwent ostomy take down on 05/11/20  under Dr. Morton Stall.  -I personally reviewed his recent CT scan from 07/05/20 with him today. This showed: mildly indistinct appearance of fat and gallbladder wall separation in upper abdomen, of uncertain significance; right-sided port-a-cath tip at high IVC near diaphragmatic hiatus; suspected solid renal neoplasm arising from anterior right kidney, unchanged from prior; left nephrolithiasis as before with lower pole calculus approximately 6 mm; no signs of metastatic disease with abundant stool in colon; unchanged 4 mm nodule in superior right lower lobe. I recommend he follow up with urology.  He does not have any clinical signs of cholecystitis, will monitor clinically. -I recommend port removal due to the malfunction and position  -He is scheduled to follow up with Dr. Morton Stall on 12/22/20.   2. Seizure 09/11/19, Left femur fracture -He had another Seizure on in 12/2019.When he went to ED on 12/15/19 he fell out due to seizure resulting in a left femur fracture.  -He was in hospital from 11/9-11/24 and then rehab. -He did complete rehab after anal surgery and is currently doing home PT. He is ambulating better independently but notes stiffness in knees and arms.    3. Mild cognitive impairment, Social support -followed by [employer] [facility] -He is a reliable historian, independent with ADLs, a/o x4, no signs of obvious impairment -Will monitor for neurotoxicities on chemo, especially oxaliplatin -He has very good family support from his 2 sisters.  -He presented to the ED on 07/03/20 with confusion. Head CT was negative.   PLAN: -I will connect him back with whoever put his port in to have it removed in the next few months -lab and f/u in 3 months -f/u as scheduled with Dr. Morton Stall in 12/2020 -He will be due for his next restaging scan in 01/2021, will order on next visit    No problem-specific Assessment & Plan notes found for this encounter.   Orders Placed This Encounter  Procedures  .  IR Removal Tun Access W/ Port W/O FL    Standing Status:   Future    Standing Expiration Date:   07/07/2021    Order Specific Question:   Reason for exam:    Answer:   tip malpositioned. No longer needed.  Order Specific Question:   Preferred Imaging Location?    Answer:   [facility]   Patient's sister had multiple questions.  All questions were answered. The patient knows to call the clinic with any problems, questions or concerns. No barriers to learning was detected. The total time spent in the appointment was 30 minutes.     Truitt Merle, MD 07/07/2020   I, Wilburn Mylar, am acting as scribe for Truitt Merle, MD.   I have reviewed the above documentation for accuracy and completeness, and I agree with the above.

## 2020-07-07 NOTE — Telephone Encounter (Signed)
Scheduled follow-up appointment per 6/2 los. Patient is aware. 

## 2020-07-13 ENCOUNTER — Ambulatory Visit
Admission: RE | Admit: 2020-07-13 | Discharge: 2020-07-13 | Disposition: A | Discharge status: Home / Self Care | Payer: 59 | Source: Ambulatory Visit | Attending: Gastroenterology | Admitting: Gastroenterology

## 2020-07-13 ENCOUNTER — Other Ambulatory Visit: Payer: Self-pay | Admitting: Gastroenterology

## 2020-07-13 DIAGNOSIS — R140 Abdominal distension (gaseous): Secondary | ICD-10-CM

## 2020-07-13 DIAGNOSIS — M19012 Primary osteoarthritis, left shoulder: Secondary | ICD-10-CM | POA: Diagnosis not present

## 2020-07-13 DIAGNOSIS — M75122 Complete rotator cuff tear or rupture of left shoulder, not specified as traumatic: Secondary | ICD-10-CM | POA: Diagnosis not present

## 2020-07-13 IMAGING — CR DG ABDOMEN 2V
2 series · 2 of 2 positions shown · non-contrast
Comparison: None.

CLINICAL DATA: Abdominal distension.

EXAM:
ABDOMEN - 2 VIEW

[w abdomen upright *]
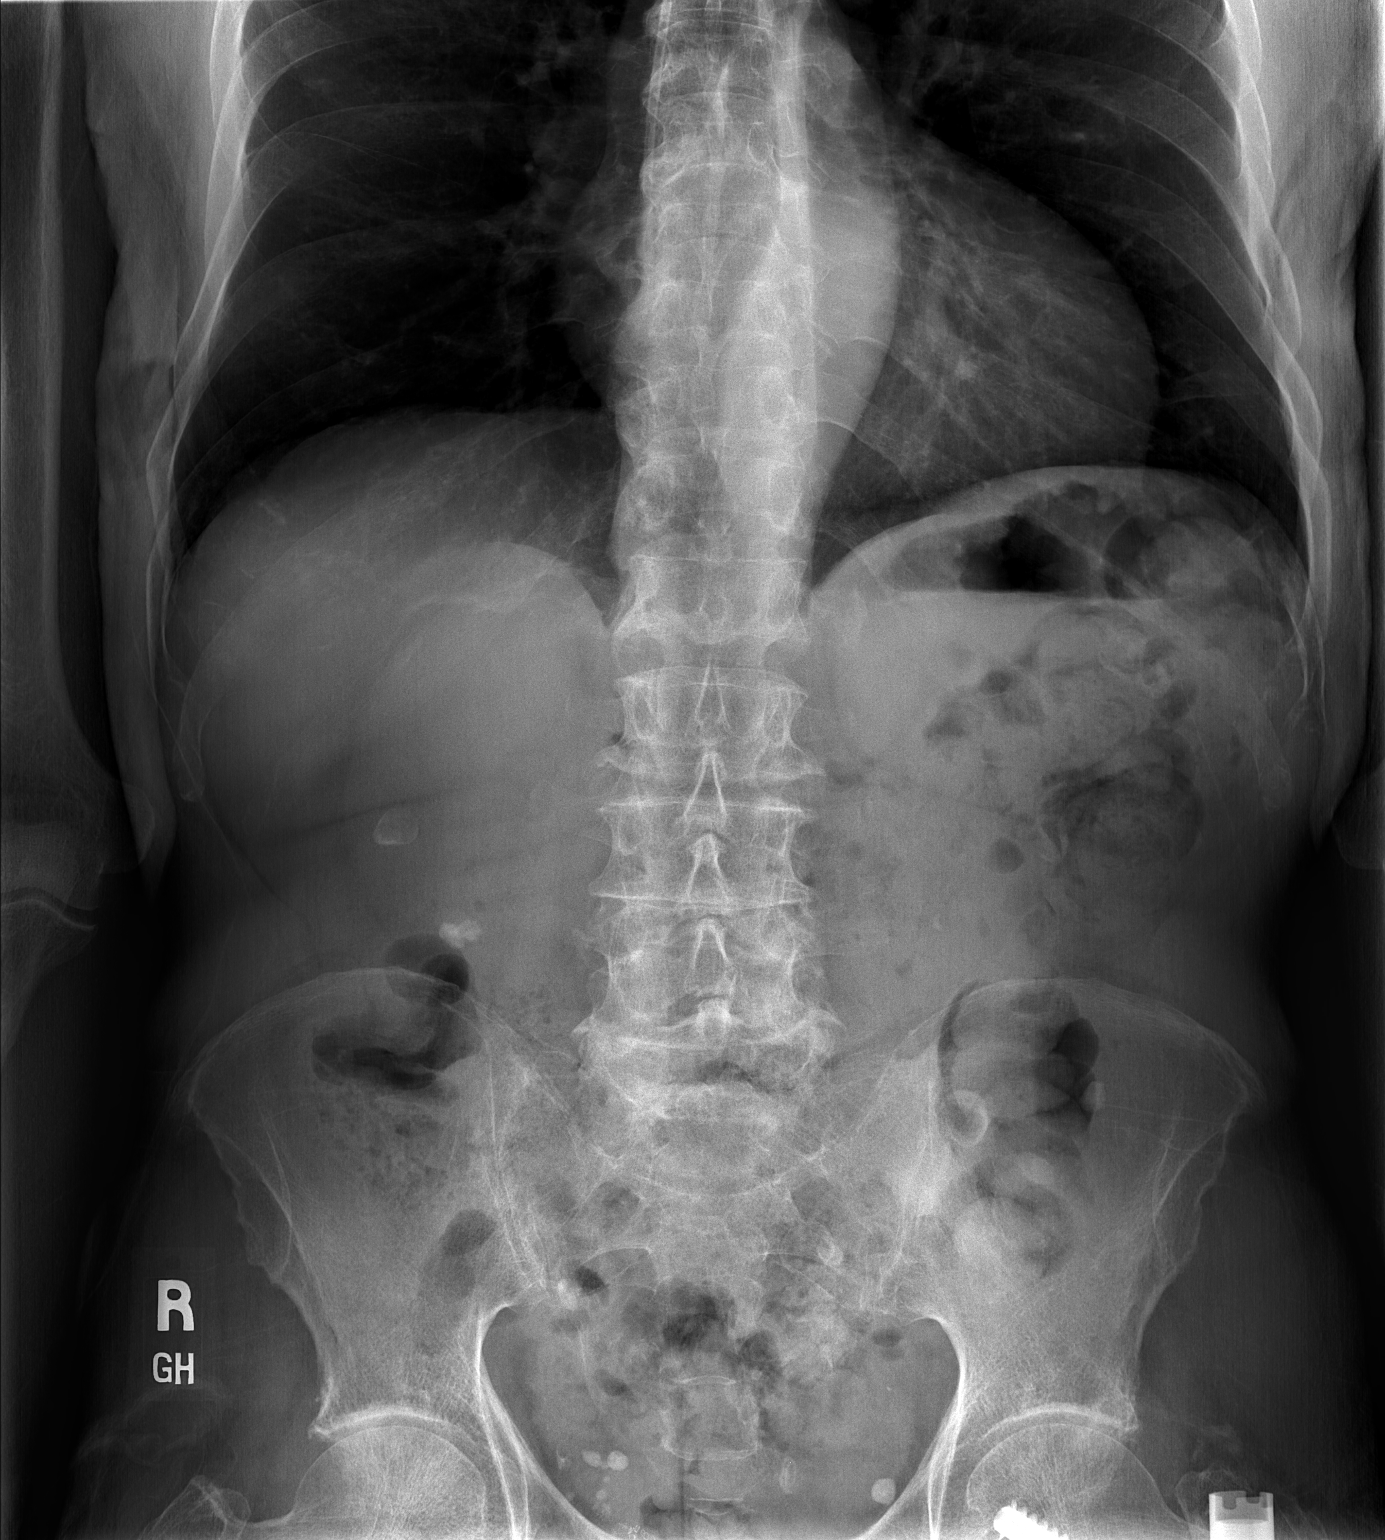

[t abdomen supine]
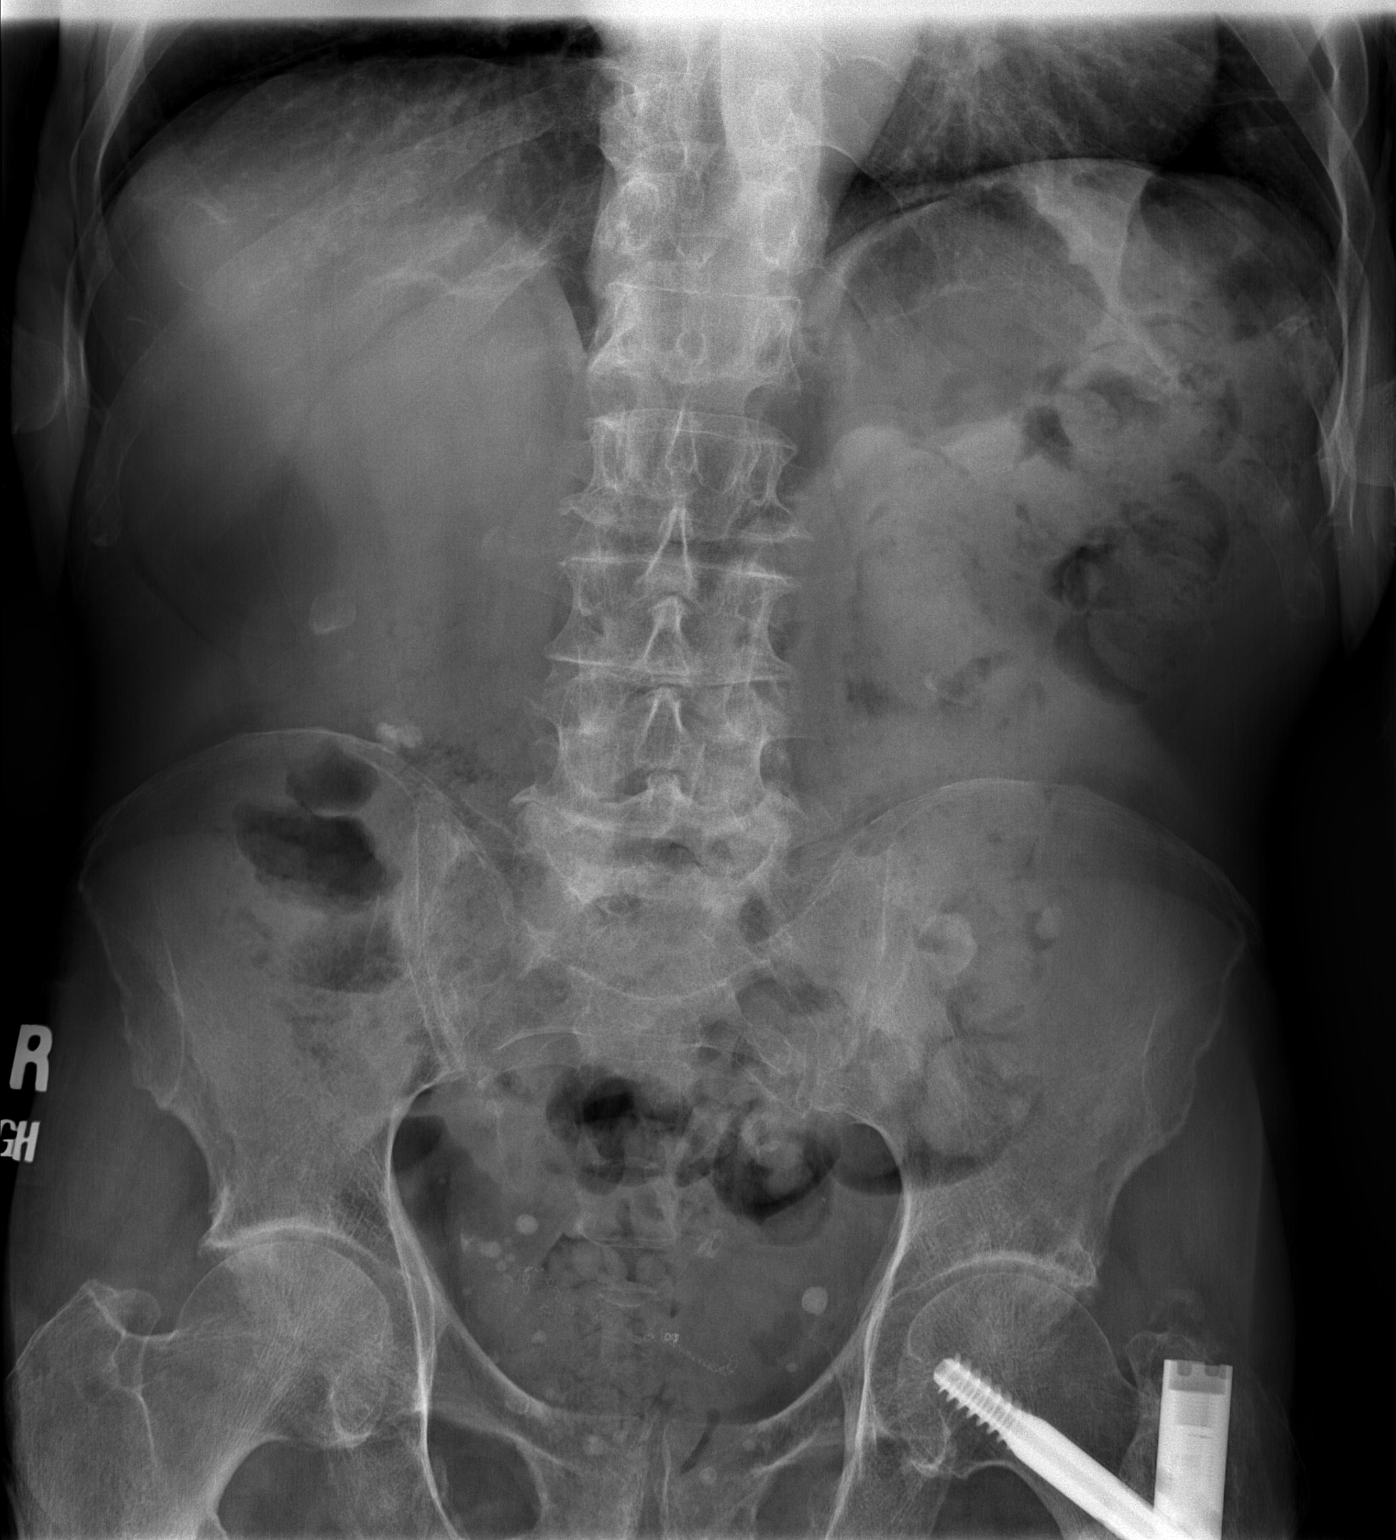

[2 of 2 positions shown; findings below may reference images not displayed]

FINDINGS: An eggshell calcification in the right upper quadrant correlates
with a stone in the gallbladder seen on the [DATE] CT scan.
Two calcifications in the lower right abdomen may be in the lower
pole the right kidney. Two calcifications project over the left
kidney and could be renal stones or bowel contents. Calcifications
in the pelvis may all represent phleboliths but are nonspecific. The
lung bases are normal. Moderate fecal loading throughout the colon.
No other acute abnormalities.
IMPRESSION: 1. Gallstone.
2. Probable bilateral renal stones.
3. Moderate fecal loading in the colon.
4. No other acute abnormalities.

## 2020-07-28 ENCOUNTER — Other Ambulatory Visit: Payer: Self-pay | Admitting: Student

## 2020-07-29 ENCOUNTER — Encounter (HOSPITAL_COMMUNITY): Payer: Self-pay

## 2020-07-29 ENCOUNTER — Ambulatory Visit (HOSPITAL_COMMUNITY)
Admission: RE | Admit: 2020-07-29 | Discharge: 2020-07-29 | Disposition: A | Discharge status: Home / Self Care | Payer: 59 | Source: Ambulatory Visit | Attending: Hematology | Admitting: Hematology

## 2020-07-29 ENCOUNTER — Other Ambulatory Visit: Payer: Self-pay

## 2020-07-29 DIAGNOSIS — R569 Unspecified convulsions: Secondary | ICD-10-CM | POA: Diagnosis not present

## 2020-07-29 DIAGNOSIS — F419 Anxiety disorder, unspecified: Secondary | ICD-10-CM | POA: Insufficient documentation

## 2020-07-29 DIAGNOSIS — N400 Enlarged prostate without lower urinary tract symptoms: Secondary | ICD-10-CM | POA: Insufficient documentation

## 2020-07-29 DIAGNOSIS — C20 Malignant neoplasm of rectum: Secondary | ICD-10-CM

## 2020-07-29 DIAGNOSIS — Z95828 Presence of other vascular implants and grafts: Secondary | ICD-10-CM

## 2020-07-29 DIAGNOSIS — Z452 Encounter for adjustment and management of vascular access device: Secondary | ICD-10-CM | POA: Insufficient documentation

## 2020-07-29 DIAGNOSIS — Z85048 Personal history of other malignant neoplasm of rectum, rectosigmoid junction, and anus: Secondary | ICD-10-CM | POA: Diagnosis not present

## 2020-07-29 DIAGNOSIS — Z79899 Other long term (current) drug therapy: Secondary | ICD-10-CM | POA: Insufficient documentation

## 2020-07-29 DIAGNOSIS — Z9221 Personal history of antineoplastic chemotherapy: Secondary | ICD-10-CM | POA: Insufficient documentation

## 2020-07-29 HISTORY — PX: IR REMOVAL TUN ACCESS W/ PORT W/O FL MOD SED: IMG2290

## 2020-07-29 MED ORDER — LIDOCAINE HCL 1 % IJ SOLN
INTRAMUSCULAR | Status: AC
  Filled 2020-07-29: qty 20

## 2020-07-29 MED ORDER — MIDAZOLAM HCL 2 MG/2ML IJ SOLN
INTRAMUSCULAR | Status: AC | PRN
  Administered 2020-07-29: 1 mg via INTRAVENOUS

## 2020-07-29 MED ORDER — FENTANYL CITRATE (PF) 100 MCG/2ML IJ SOLN
INTRAMUSCULAR | Status: AC | PRN
  Administered 2020-07-29: 50 ug via INTRAVENOUS

## 2020-07-29 MED ORDER — SODIUM CHLORIDE 0.9 % IV SOLN: INTRAVENOUS | Status: DC

## 2020-07-29 MED ORDER — MIDAZOLAM HCL 2 MG/2ML IJ SOLN
INTRAMUSCULAR | Status: AC
  Filled 2020-07-29: qty 2

## 2020-07-29 MED ORDER — FENTANYL CITRATE (PF) 100 MCG/2ML IJ SOLN
INTRAMUSCULAR | Status: AC
  Filled 2020-07-29: qty 2

## 2020-07-29 NOTE — Procedures (Signed)
Interventional Radiology Procedure Note  Procedure: Port removal  Complications: None  Estimated Blood Loss: < 10 mL  Findings: Right chest port removed in entirety using sharp and blunt dissection. Wound closed.  Venetia Night. Kathlene Cote, M.D Pager:  [phone]

## 2020-07-29 NOTE — Discharge Instructions (Signed)
For questions /concerns may call Interventional Radiology at [phone]  You may remove your dressing and shower tomorrow afternoon   Implanted Port Removal, Care After This sheet gives you information about how to care for yourself after your procedure. Your health care provider may also give you more specific instructions. If you have problems or questions, contact your health careprovider. What can I expect after the procedure? After the procedure, it is common to have: Soreness or pain near your incision. Some swelling or bruising near your incision. Follow these instructions at home: Medicines Take over-the-counter and prescription medicines only as told by your health care provider. If you were prescribed an antibiotic medicine, take it as told by your health care provider. Do not stop taking the antibiotic even if you start to feel better. Bathing Do not take baths, swim, or use a hot tub until your health care provider approves. Ask your health care provider if you can take showers. You may only be allowed to take sponge baths. Incision care Follow instructions from your health care provider about how to take care of your incision. Make sure you: Wash your hands with soap and water before you change your bandage (dressing). If soap and water are not available, use hand sanitizer. Change your dressing as told by your health care provider. Keep your dressing dry. If adhesive strip edges start to loosen and curl up, you may trim the loose edges. Do not remove adhesive strips completely unless your health care provider tells you to do that. Check your incision area every day for signs of infection. Check for: More redness, swelling, or pain. More fluid or blood. Warmth. Pus or a bad smell.  Driving Do not drive for 24 hours if you were given a medicine to help you relax (sedative) during your procedure. If you did not receive a sedative, ask your health care provider when it is  safe to drive.  Activity Return to your normal activities as told by your health care provider. Ask your health care provider what activities are safe for you. Do not lift anything that is heavier than 10 lb (4.5 kg), or the limit that you are told, until your health care provider says that it is safe. Do not do activities that involve lifting your arms over your head. General instructions Do not use any products that contain nicotine or tobacco, such as cigarettes and e-cigarettes. These can delay healing. If you need help quitting, ask your health care provider. Keep all follow-up visits as told by your health care provider. This is important. Contact a health care provider if: You have more redness, swelling, or pain around your incision. You have more fluid or blood coming from your incision. Your incision feels warm to the touch. You have pus or a bad smell coming from your incision. You have pain that is not relieved by your pain medicine. Get help right away if you have: A fever or chills. Chest pain. Difficulty breathing. Summary After the procedure, it is common to have pain, soreness, swelling, or bruising near your incision. If you were prescribed an antibiotic medicine, take it as told by your health care provider. Do not stop taking the antibiotic even if you start to feel better. Do not drive for 24 hours if you were given a sedative during your procedure. Return to your normal activities as told by your health care provider. Ask your health care provider what activities are safe for you. This information is not intended  to replace advice given to you by your health care provider. Make sure you discuss any questions you have with your healthcare provider.     Moderate Conscious Sedation, Adult, Care After This sheet gives you information about how to care for yourself after your procedure. Your health care provider may also give you more specific instructions. If you have  problems or questions, contact your health careprovider. What can I expect after the procedure? After the procedure, it is common to have: Sleepiness for several hours. Impaired judgment for several hours. Difficulty with balance. Vomiting if you eat too soon. Follow these instructions at home: For the time period you were told by your health care provider: Rest. Do not participate in activities where you could fall or become injured. Do not drive or use machinery. Do not drink alcohol. Do not take sleeping pills or medicines that cause drowsiness. Do not make important decisions or sign legal documents. Do not take care of children on your own. Eating and drinking Follow the diet recommended by your health care provider. Drink enough fluid to keep your urine pale yellow. If you vomit: Drink water, juice, or soup when you can drink without vomiting. Make sure you have little or no nausea before eating solid foods.  General instructions Take over-the-counter and prescription medicines only as told by your health care provider. Have a responsible adult stay with you for the time you are told. It is important to have someone help care for you until you are awake and alert. Do not smoke. Keep all follow-up visits as told by your health care provider. This is important. Contact a health care provider if: You are still sleepy or having trouble with balance after 24 hours. You feel light-headed. You keep feeling nauseous or you keep vomiting. You develop a rash. You have a fever. You have redness or swelling around the IV site. Get help right away if: You have trouble breathing. You have new-onset confusion at home. Summary After the procedure, it is common to feel sleepy, have impaired judgment, or feel nauseous if you eat too soon. Rest after you get home. Know the things you should not do after the procedure. Follow the diet recommended by your health care provider and drink  enough fluid to keep your urine pale yellow. Get help right away if you have trouble breathing or new-onset confusion at home. This information is not intended to replace advice given to you by your health care provider. Make sure you discuss any questions you have with your healthcare provider. Document Revised: 05/22/2019 Document Reviewed: 12/18/2018 Elsevier Patient Education  2022 [employer].

## 2020-07-29 NOTE — H&P (Signed)
Chief Complaint: Patient was seen in consultation today for Port-A-Cath removal at the request of Feng,Yan  Referring Physician(s): Feng,Yan  Supervising Physician: Aletta Edouard  Patient Status: Carlos [name]  History of Present Illness: Carlos [name] is a 75 y.o. male with PMH of anxiety, BPH, seizures on Keppra, rectal cancer diagnosed in May 2018 s/p chemotherapy and surgical resection.  Patient has been followed by Dr. Burr Medico, and he underwent follow-up CT abdomen pelvis without contrast on 07/05/2020 which showed no signs of metastatic disease.  On the CT, it was noted that the Port-A-Cath tip was at the high IVC near the diaphragm hiatus.  Patient had a follow-up visit with Dr. Burr Medico on 07/07/2020, when it was determined that the Port-A-Cath can be removed safely.  After thorough discussion and shared decision making, patient decided to proceed with the Port-A-Cath removal.  IR was requested for Port-A-Cath removal.  Patient laying in bed, not in acute distress.  Denise headache, fever, chills, shortness of breath, cough, chest pain, abdominal pain, nausea ,vomiting, and bleeding.   Past Medical History:  Diagnosis Date   Anxiety    Benign prostatic hyperplasia 03/30/2013   10/1 IMO update   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/23/2019   rectal ca dx'd 06/2016   Seizures ([place])     Past Surgical History:  Procedure Laterality Date   COLON SURGERY     HIP SURGERY     INTRAMEDULLARY (IM) NAIL INTERTROCHANTERIC Left 12/11/2019   Procedure: INTRAMEDULLARY (IM) NAIL INTERTROCHANTRIC;  Surgeon: Rod Can, MD;  Location: [facility];  Service: Orthopedics;  Laterality: Left;   PORTACATH PLACEMENT N/A 07/01/2019   Procedure: PORT ULTRASOUND GUIDED PLACEMENT;  Surgeon: Alphonsa Overall, MD;  Location: [facility];  Service: General;  Laterality: N/A;   PROSTATE SURGERY  2004   RECTAL SURGERY     TONSILECTOMY, ADENOIDECTOMY, BILATERAL MYRINGOTOMY AND TUBES       Allergies: Patient has no known allergies.  Medications: Prior to Admission medications   Medication Sig Start Date End Date Taking? Authorizing Provider  levETIRAcetam (KEPPRA) 1000 MG tablet Take 1 tablet twice a day (take with Levetiracetam 250mg  tablet for total of 1250mg  twice a day) 04/22/20  Yes Cameron Sprang, MD  levETIRAcetam (KEPPRA) 250 MG tablet Take 1 tablet twice a day (take with Levetiracetam 1000mg  tablet for total of 1250mg  twice a day) 04/22/20  Yes Cameron Sprang, MD  Multiple Vitamin (MULTIVITAMIN WITH MINERALS) TABS tablet Take 1 tablet by mouth daily.   Yes [provider]  NON FORMULARY Take 1 capsule by mouth daily. Mind works   Yes [provider]  loperamide (IMODIUM) 2 MG capsule Take 1 capsule (2 mg total) by mouth every 6 (six) hours as needed for diarrhea or loose stools. 12/15/19   Dahal, Marlowe Aschoff, MD  psyllium (METAMUCIL) 58.6 % packet Take 1 packet by mouth daily.    [provider]     Family History  Problem Relation Age of Onset   Osteoporosis Mother    Dementia Mother    Diabetes Mother    Brain cancer Father     Social History   Socioeconomic History   Marital status: Single    Spouse name: Not on file   Number of children: 0   Years of education: 45   Highest education level: Bachelor's degree (e.g., BA, AB, BS)  Occupational History   Not on file  Tobacco Use   Smoking status: Never   Smokeless tobacco: Never  Vaping Use   Vaping Use: Never used  Substance and Sexual Activity   Alcohol use: No   Drug use: No   Sexual activity: Not Currently  Other Topics Concern   Not on file  Social History Narrative   4 year College- history major      Pts mother lives with him   Right handed   Two story home   Drinks tea   Used to live in [place]   Social Determinants of Radio broadcast assistant Strain: Not on file  Food Insecurity: Not on file  Transportation Needs: Not on file  Physical Activity: Not  on file  Stress: Not on file  Social Connections: Not on file     Review of Systems: A 12 point ROS discussed and pertinent positives are indicated in the HPI above.  All other systems are negative.   Vital Signs: BP 124/76   Pulse (!) 58   Temp 97.8 F (36.6 C) (Oral)   Resp 16   SpO2 95%   Physical Exam  Vitals and nursing note reviewed.  Constitutional:      General: He is not in acute distress.    Appearance: Normal appearance.  HENT:     Head: Normocephalic and atraumatic.     Mouth/Throat:     Mouth: Mucous membranes are moist.     Pharynx: Oropharynx is clear.  Cardiovascular:     Rate and Rhythm: Normal rate and regular rhythm.     Pulses: Normal pulses.     Heart sounds: Normal heart sounds.  Pulmonary:     Effort: Pulmonary effort is normal.     Breath sounds: Normal breath sounds. No wheezing, rhonchi or rales.  Abdominal:     General: Bowel sounds are normal. There is no distension.     Palpations: Abdomen is soft.  Skin:    General: Skin is warm and dry.  Neurological:     Mental Status: He is alert and oriented to person, place, and time.  Psychiatric:        Mood and Affect: Mood normal.        Behavior: Behavior normal.    MD Evaluation Airway: WNL Heart: WNL Abdomen: WNL Chest/ Lungs: WNL ASA  Classification: 3 Mallampati/Airway Score: One  Imaging: DG Chest 2 View  Result Date: 07/03/2020 CLINICAL DATA:  Altered mental status.  Leukocytosis. EXAM: CHEST - 2 VIEW COMPARISON:  12/09/2019. FINDINGS: Cardiac silhouette is normal in size. No mediastinal or hilar masses or evidence of adenopathy. Prominent interstitial markings most evident in the bases. Lungs otherwise clear. No pleural effusion or pneumothorax. Right anterior chest wall Port-A-Cath is stable. Skeletal structures are demineralized. Mild to moderate compression deformity of an upper thoracic vertebra, stable compared to the CT dated 01/29/2020. IMPRESSION: No acute cardiopulmonary  disease. Electronically Signed   By: Lajean Manes M.D.   On: 07/03/2020 20:38   CT Head Wo Contrast  Result Date: 07/03/2020 CLINICAL DATA:  Confusion. Altered mental status since yesterday around 11 a.m. EXAM: CT HEAD WITHOUT CONTRAST TECHNIQUE: Contiguous axial images were obtained from the base of the skull through the vertex without intravenous contrast. COMPARISON:  12/08/2019. FINDINGS: Brain: No evidence of acute infarction, hemorrhage, hydrocephalus, extra-axial collection or mass lesion/mass effect. Vascular: No hyperdense vessel or unexpected calcification. Skull: Normal. Negative for fracture or focal lesion. Sinuses/Orbits: Visualized globes and orbits are unremarkable are clear. Visualized sinuses Other: None. IMPRESSION: 1. No acute intracranial abnormalities. No change from the prior study.  Electronically Signed   By: Lajean Manes M.D.   On: 07/03/2020 20:09   DG Abd 2 Views  Result Date: 07/15/2020 CLINICAL DATA:  Abdominal distension. EXAM: ABDOMEN - 2 VIEW COMPARISON:  None. FINDINGS: An eggshell calcification in the right upper quadrant correlates with a stone in the gallbladder seen on the Jul 05, 2020 CT scan. Two calcifications in the lower right abdomen may be in the lower pole the right kidney. Two calcifications project over the left kidney and could be renal stones or bowel contents. Calcifications in the pelvis may all represent phleboliths but are nonspecific. The lung bases are normal. Moderate fecal loading throughout the colon. No other acute abnormalities. IMPRESSION: 1. Gallstone. 2. Probable bilateral renal stones. 3. Moderate fecal loading in the colon. 4. No other acute abnormalities. Electronically Signed   By: Dorise Bullion III M.D   On: 07/15/2020 20:58   CT CHEST ABDOMEN PELVIS WO CONTRAST  Result Date: 07/05/2020 CLINICAL DATA:  Follow-up of colorectal cancer diagnosed in 2021 with history of colostomy and reversal in April. EXAM: CT CHEST, ABDOMEN AND PELVIS  WITHOUT CONTRAST TECHNIQUE: Multidetector CT imaging of the chest, abdomen and pelvis was performed following the standard protocol without IV contrast. COMPARISON:  CT of January 29, 2020. FINDINGS: CT CHEST FINDINGS Cardiovascular: Normal heart size without substantial pericardial effusion. RIGHT-sided Port-A-Cath terminates in the IVC as before and or lower RIGHT atrium. Position again is unchanged grossly compared to the prior study. Central pulmonary vasculature is grossly stable. Limited assessment of cardiovascular structures given lack of intravenous contrast. Mediastinum/Nodes: Thoracic inlet structures are unremarkable. No axillary lymphadenopathy. No mediastinal or hilar lymphadenopathy. Mildly patulous esophagus. Lungs/Pleura: Basilar atelectasis without dense consolidative changes or evidence of pleural effusion. 4 mm nodule in the superior segment of the RIGHT lower lobe is unchanged. Airways are patent. Musculoskeletal: Visualized clavicles and scapulae are intact. No destructive rib lesions. See below for full musculoskeletal details. CT ABDOMEN PELVIS FINDINGS Hepatobiliary: Normal contour and size of the liver without gross lesion. Mildly indistinct appearance of the fat and gallbladder wall separation in the upper abdomen but with some respiratory motion. Numerous gallstones are present in the gallbladder lumen. Pancreas: Pancreas with smooth contours. The mildly indistinct margins of the fat in the upper abdomen, of uncertain significance. No gross peripancreatic stranding or fluid. Spleen: Normal size spleen. Adrenals/Urinary Tract: Adrenal glands are normal. Symmetric renal enhancement. The lesion arising from the anterior RIGHT kidney shown to enhance on the prior study measures approximately 2.4 x 2.0 cm and is grossly unchanged. Intermediate density to higher density lesion with homogeneous characteristics in the interpolar LEFT kidney measures 62 Hounsfield units and 2.3 cm, unchanged  based on size compared to the prior study. No hydronephrosis. No nephrolithiasis. Urinary bladder with smooth contours. Stomach/Bowel: No signs of bowel obstruction though there is a large amount of stool in the ascending colon and in the transverse colon, formed stool. Lesser distension of the more distal colon with stool and gas in the lumen. No pericolonic stranding. 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 Smooth contour of the IVC. There is no gastrohepatic or hepatoduodenal ligament lymphadenopathy. No retroperitoneal or mesenteric lymphadenopathy. Reproductive: Prostate unremarkable by CT grossly, not well assessed. Other: Signs of prior ostomy in the RIGHT hemiabdomen. Small midline periumbilical hernia containing fat similar to previous imaging. Musculoskeletal: Spinal degenerative changes. No acute or destructive bone process. Post ORIF of the  LEFT femur partially imaged. IMPRESSION: 1. Mildly indistinct appearance of the fat and gallbladder wall separation in the upper abdomen, of uncertain significance and in the setting of abundant gallstones in the gallbladder lumen. Correlate with any clinical signs of abdominal pain and consider further evaluation with ultrasound as warranted. 2. RIGHT-sided Port-A-Cath tip at the high IVC near the diaphragmatic hiatus. Could consider repositioning as warranted. 3. 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 Consider follow-up evaluation, renal protocol study with with MRI with and without contrast as outlined in previous imaging. Hemorrhagic and/or proteinaceous cysts elsewhere in the kidneys. 4. LEFT nephrolithiasis as before with lower pole calculus approximately 6 mm. 5. No signs of metastatic disease with abundant stool in the colon following partial colonic resection. Correlate with any signs of  constipation. 6. 4 mm nodule in the superior segment of the RIGHT lower lobe is unchanged. Attention on follow-up 7. Small midline periumbilical hernia containing fat. 8. Aortic atherosclerosis. These results will be called to the ordering clinician or representative by the Radiologist Assistant, and communication documented in the PACS or [employer]. Aortic Atherosclerosis (ICD10-I70.0). Electronically Signed   By: Zetta Bills M.D.   On: 07/05/2020 13:08    Labs:  CBC: Recent Labs    01/06/20 1405 04/06/20 1045 07/03/20 1858 07/05/20 1150  WBC 4.7 3.4* 14.1* 12.5*  HGB 12.0* 12.3* 12.8* 12.1*  HCT 36.7* 36.4* 38.0* 37.7*  PLT 212 189 187 175    COAGS: Recent Labs    09/11/19 0953 07/03/20 1858  INR 1.0 1.0  APTT 22*  --     BMP: Recent Labs    09/24/19 0858 10/08/19 0851 11/02/19 0830 11/09/19 0947 11/16/19 1148 01/06/20 1405 04/06/20 1045 07/03/20 1858 07/05/20 1005  NA 141 141 140 138   < > 141 135 129* 135  K 3.9 3.7 4.3 4.1   < > 4.2 4.4 4.4 4.0  CL 105 105 106 105   < > 105 106 92* 99  CO2 26 27 28 28    < > 28 23 30 26   GLUCOSE 123* 136* 113* 116*   < > 93 96 111* 105*  BUN 6* 11 11 13    < > 9 14 12 9   CALCIUM 9.8 10.1 9.4 9.5   < > 9.7 9.6 9.3 9.0  CREATININE 0.84 0.80 0.80 0.78   < > 0.78 0.74 0.60* 0.65  GFRNONAA >60 >60 >60 >60   < > >60 >60 >60 >60  GFRAA >60 >60 >60 >60  --   --   --   --   --    < > = values in this interval not displayed.    LIVER FUNCTION TESTS: Recent Labs    01/06/20 1405 04/06/20 1045 07/03/20 1858 07/05/20 1005  BILITOT 0.5 0.6 0.8 0.8  AST 21 25 29 30   ALT 20 30 26 28   ALKPHOS 178* 100 67 73  PROT 7.0 7.2 6.7 6.6  ALBUMIN 3.9 4.0 4.1 3.3*    TUMOR MARKERS: No results for input(s): AFPTM, CEA, CA199, CHROMGRNA in the last 8760 hours.  Assessment and Plan: 75 y.o. male with history of rectal cancer s/p chemotherapy and surgical resection, currently in remission.  Follow-up CT on 07/05/2020 showed no  evidence of static disease as well as malpositioned Port-A-Cath.  Patient had a follow-up visit with Dr. Burr Medico on 07/07/2020, when it was determined that the Port-A-Cath can be removed safely.  After thorough discussion and shared decision making, patient  decided to proceed with the Port-A-Cath removal.  IR was requested for Port-A-Cath removal. Patient presents to IR today for the procedure. N.p.o. since 7 AM VS with mild bradycardia, HR 58 --patient asymptomatic. Not on anticoagulant/antiplatelet treatment.  Risks and benefits of image guided port-a-catheter removal was discussed with the patient including, but not limited to bleeding, infection, and damage to blood vessel.  All of the patient's questions were answered, patient is agreeable to proceed. Consent signed and in chart.   Thank you for this interesting consult.  I greatly enjoyed meeting Carlos [name] and look forward to participating in their care.  A copy of this report was sent to the requesting provider on this date.  Electronically Signed: Tera Mater, PA-C 07/29/2020, 2:09 PM   I spent a total of  30 Minutes   in face to face in clinical consultation, greater than 50% of which was counseling/coordinating care for Port-A-Cath removal

## 2020-08-15 DIAGNOSIS — R1084 Generalized abdominal pain: Secondary | ICD-10-CM | POA: Diagnosis not present

## 2020-08-15 DIAGNOSIS — R634 Abnormal weight loss: Secondary | ICD-10-CM | POA: Diagnosis not present

## 2020-08-15 DIAGNOSIS — R140 Abdominal distension (gaseous): Secondary | ICD-10-CM | POA: Diagnosis not present

## 2020-08-19 DIAGNOSIS — Z85048 Personal history of other malignant neoplasm of rectum, rectosigmoid junction, and anus: Secondary | ICD-10-CM | POA: Diagnosis not present

## 2020-08-19 DIAGNOSIS — Z86010 Personal history of colonic polyps: Secondary | ICD-10-CM | POA: Diagnosis not present

## 2020-08-19 DIAGNOSIS — R198 Other specified symptoms and signs involving the digestive system and abdomen: Secondary | ICD-10-CM | POA: Diagnosis not present

## 2020-08-19 DIAGNOSIS — R194 Change in bowel habit: Secondary | ICD-10-CM | POA: Diagnosis not present

## 2020-08-23 DIAGNOSIS — Z20822 Contact with and (suspected) exposure to covid-19: Secondary | ICD-10-CM | POA: Diagnosis not present

## 2020-08-24 DIAGNOSIS — M19012 Primary osteoarthritis, left shoulder: Secondary | ICD-10-CM | POA: Diagnosis not present

## 2020-09-07 DIAGNOSIS — R972 Elevated prostate specific antigen [PSA]: Secondary | ICD-10-CM | POA: Diagnosis not present

## 2020-09-07 DIAGNOSIS — N281 Cyst of kidney, acquired: Secondary | ICD-10-CM | POA: Diagnosis not present

## 2020-09-07 DIAGNOSIS — D49511 Neoplasm of unspecified behavior of right kidney: Secondary | ICD-10-CM | POA: Diagnosis not present

## 2020-09-14 DIAGNOSIS — D175 Benign lipomatous neoplasm of intra-abdominal organs: Secondary | ICD-10-CM | POA: Diagnosis not present

## 2020-09-14 DIAGNOSIS — K5730 Diverticulosis of large intestine without perforation or abscess without bleeding: Secondary | ICD-10-CM | POA: Diagnosis not present

## 2020-09-14 DIAGNOSIS — R197 Diarrhea, unspecified: Secondary | ICD-10-CM | POA: Diagnosis not present

## 2020-09-14 DIAGNOSIS — Z85038 Personal history of other malignant neoplasm of large intestine: Secondary | ICD-10-CM | POA: Diagnosis not present

## 2020-09-14 DIAGNOSIS — Z980 Intestinal bypass and anastomosis status: Secondary | ICD-10-CM | POA: Diagnosis not present

## 2020-09-14 DIAGNOSIS — K602 Anal fissure, unspecified: Secondary | ICD-10-CM | POA: Diagnosis not present

## 2020-09-14 DIAGNOSIS — Z86010 Personal history of colonic polyps: Secondary | ICD-10-CM | POA: Diagnosis not present

## 2020-09-16 DIAGNOSIS — R197 Diarrhea, unspecified: Secondary | ICD-10-CM | POA: Diagnosis not present

## 2020-09-20 DIAGNOSIS — H25811 Combined forms of age-related cataract, right eye: Secondary | ICD-10-CM | POA: Diagnosis not present

## 2020-09-20 DIAGNOSIS — Z01818 Encounter for other preprocedural examination: Secondary | ICD-10-CM | POA: Diagnosis not present

## 2020-09-21 DIAGNOSIS — H25811 Combined forms of age-related cataract, right eye: Secondary | ICD-10-CM | POA: Diagnosis not present

## 2020-09-21 DIAGNOSIS — H2512 Age-related nuclear cataract, left eye: Secondary | ICD-10-CM | POA: Diagnosis not present

## 2020-09-30 ENCOUNTER — Encounter: Payer: Self-pay | Admitting: Hematology

## 2020-10-05 DIAGNOSIS — H25812 Combined forms of age-related cataract, left eye: Secondary | ICD-10-CM | POA: Diagnosis not present

## 2020-10-07 ENCOUNTER — Other Ambulatory Visit: Payer: Self-pay

## 2020-10-07 ENCOUNTER — Inpatient Hospital Stay: Payer: Medicare Other

## 2020-10-07 ENCOUNTER — Encounter: Payer: Self-pay | Admitting: Hematology

## 2020-10-07 ENCOUNTER — Inpatient Hospital Stay: Payer: Medicare Other | Attending: Hematology | Admitting: Hematology

## 2020-10-07 ENCOUNTER — Other Ambulatory Visit: Payer: 59

## 2020-10-07 VITALS — BP 142/74 | HR 64 | Temp 97.9°F | Resp 17 | Wt 132.9 lb

## 2020-10-07 DIAGNOSIS — R197 Diarrhea, unspecified: Secondary | ICD-10-CM | POA: Insufficient documentation

## 2020-10-07 DIAGNOSIS — G3184 Mild cognitive impairment, so stated: Secondary | ICD-10-CM | POA: Diagnosis not present

## 2020-10-07 DIAGNOSIS — C20 Malignant neoplasm of rectum: Secondary | ICD-10-CM | POA: Diagnosis not present

## 2020-10-07 DIAGNOSIS — I700 Atherosclerosis of aorta: Secondary | ICD-10-CM | POA: Diagnosis not present

## 2020-10-07 LAB — CMP (CANCER CENTER ONLY)
ALT: 15 U/L (ref 0–44)
AST: 17 U/L (ref 15–41)
Albumin: 4.1 g/dL (ref 3.5–5.0)
Alkaline Phosphatase: 89 U/L (ref 38–126)
Anion gap: 10 (ref 5–15)
BUN: 16 mg/dL (ref 8–23)
CO2: 28 mmol/L (ref 22–32)
Calcium: 9.6 mg/dL (ref 8.9–10.3)
Chloride: 105 mmol/L (ref 98–111)
Creatinine: 0.84 mg/dL (ref 0.61–1.24)
GFR, Estimated: >60 mL/min (ref >60)
Glucose, Bld: 93 mg/dL (ref 70–99)
Potassium: 4.1 mmol/L (ref 3.5–5.1)
Sodium: 143 mmol/L (ref 135–145)
Total Bilirubin: 0.5 mg/dL (ref 0.3–1.2)
Total Protein: 7.1 g/dL (ref 6.5–8.1)

## 2020-10-07 LAB — CBC WITH DIFFERENTIAL (CANCER CENTER ONLY)
Abs Immature Granulocytes: 0.01 10*3/uL (ref 0.00–0.07)
Basophils Absolute: 0 10*3/uL (ref 0.0–0.1)
Basophils Relative: 1 %
Eosinophils Absolute: 0 10*3/uL (ref 0.0–0.5)
Eosinophils Relative: 1 %
HCT: 37.9 % — ABNORMAL LOW (ref 39.0–52.0)
Hemoglobin: 12.5 g/dL — ABNORMAL LOW (ref 13.0–17.0)
Immature Granulocytes: 0 %
Lymphocytes Relative: 28 %
Lymphs Abs: 1.2 10*3/uL (ref 0.7–4.0)
MCH: 29.2 pg (ref 26.0–34.0)
MCHC: 33 g/dL (ref 30.0–36.0)
MCV: 88.6 fL (ref 80.0–100.0)
Monocytes Absolute: 0.4 10*3/uL (ref 0.1–1.0)
Monocytes Relative: 10 %
Neutro Abs: 2.6 10*3/uL (ref 1.7–7.7)
Neutrophils Relative %: 60 %
Platelet Count: 209 10*3/uL (ref 150–400)
RBC: 4.28 MIL/uL (ref 4.22–5.81)
RDW: 13.7 % (ref 11.5–15.5)
WBC Count: 4.4 10*3/uL (ref 4.0–10.5)
nRBC: 0 % (ref 0.0–0.2)

## 2020-10-07 NOTE — Progress Notes (Signed)
[place]   [phone]) [phone] [phone]   Clinic Follow up Note   Patient Care Team: [name], MD as PCP - General (Family Medicine) Cameron Sprang, MD as Consulting Physician (Neurology) Truitt Merle, MD as Consulting Physician (Hematology) Ronnette Juniper, MD as Consulting Physician (Gastroenterology) Elease Hashimoto (Neurology)  Date of Service:  10/07/2020  CHIEF COMPLAINT: f/u of rectal cancer  CURRENT THERAPY:  Surveillance  ASSESSMENT & PLAN:  Carlos [name] is a 75 y.o. male with   1. Adenocarcinoma of the rectum, cT4N1M0 stage III, ypT1aN0  -He was initially diagnosed in 05/2019 with locally advanced stage III rectal cancer and treated with total neoadjvant chemotherapy FOLFOX 07/02/19-10/08/19 and concurrent chemoRT with Xeloda '1500mg'$  BID for 5-6 weeks 11/02/19-12/07/19. He missed last 2 treatments due to hospitalization for Seizure and left femur fracture.  -He underwent low anterior resection surgery with Dr Morton Stall at [facility] on 02/08/20. Surgical pathology showed complete surgical resection with mild T1 residual disease, LN negative.  -He underwent ostomy take down on 05/11/20 under Dr. Morton Stall.  -CT CAP 07/05/20 showed NED -He had his port removed on 07/29/20 due to malfunction and position  -He is clinically doing well except some gas and frequent BM. Labs reviewed, CBC unremarkable, CMP pending. Physical exam also unremarkable. There is no clinical concern for recurrence. Per pt report, he had repeat colonoscopy last month with Dr. Therisa Doyne. I do no have a report from this. -He is scheduled to follow up with Dr. Morton Stall on 12/22/20.  -He will be due for restaging CT in 01/2021.    2. Seizure 09/11/19, Left femur fracture  -He had another Seizure on in 12/2019. When he went to ED on 12/15/19 he fell out due to seizure resulting in a left femur fracture.  -He was in hospital from 11/9-11/24 and then rehab. -He did complete rehab after anal surgery and is  currently doing home PT. He is ambulating better independently but notes stiffness in knees and arms.     3. Mild cognitive impairment, Social support  -followed by [employer] [facility] -He is a reliable historian, independent with ADLs, a/o x4, no signs of obvious impairment -Will monitor for neurotoxicities on chemo, especially oxaliplatin  -He has very good family support from his 2 sisters. He lives with his mother, who has staff that cooks food for them. -He presented to the ED on 07/03/20 with confusion. Head CT was negative. -He requests a note to his job to excuse him from work. His sister notes this needs to be completed by 10/11/20. We will have this done for him.     PLAN: -f/u as scheduled with Dr. Morton Stall in 12/2020 -lab and f/u in 3 months, with restaging CT several days prior -will try to get his last colonoscopy report from Dr. Therisa Doyne  -will write a letter for his work per his request    No problem-specific Assessment & Plan notes found for this encounter.   SUMMARY OF ONCOLOGIC HISTORY: Oncology History Overview Note  Cancer Staging Rectal adenocarcinoma [facility]) Staging form: Colon and Rectum, AJCC 8th Edition - Clinical stage from 06/12/2019: cT4, cN1 - Unsigned Stage prefix: Initial diagnosis - Pathologic stage from 02/08/2020: Stage I (ypT1, pN0, cM0) - Signed by Truitt Merle, MD on 04/06/2020 Stage prefix: Post-therapy Total positive nodes: 0 Residual tumor (R): R0 - None    Rectal adenocarcinoma ([name])  05/29/2019 Procedure   Colonoscopy per Dr. Therisa Doyne A digital rectal exam was normal  frond-like  fungating infiltrative non-obstructing large mass in the rectum. The mass was partially circumferential, measured 5 cm in length. The colonoscopy also showed 11 sessile polyps in the transverse and asecnding colon and hepatic flexure.    05/29/2019 Initial Biopsy   Pathology on the rectal biopsy showed at least intramucosal adenocarcinoma involving tubulovillous adenoma with high grade  dysplasia.   06/05/2019 Imaging   CT CAP IMPRESSION: 1. Cholelithiasis and gallbladder wall thickening with equivocal pericholecystic inflammation. Acute cholecystitis not excluded. If there is clinical suspicion for acute cholecystitis, recommend ultrasound evaluation. 2. Approximately 5 cm irregular mass within the distal sigmoid colon with possible extension through the wall. No definite abnormal adjacent lymph nodes. 3. Mild omental haziness-nonspecific. Metastatic disease not excluded. 4. Indeterminate 3-4 mm RIGHT pulmonary nodules which could represent metastatic disease. These are too small for PET CT evaluation. Consider CT follow-up. 5. 2.5 cm irregular cystic mass within the RIGHT kidney. Elective MRI recommended for further evaluation. 6. 4 mm nonobstructing LEFT LOWER pole renal calculus. 7. Coronary artery disease. 8. Aortic Atherosclerosis (ICD10-I70.0).   06/12/2019 Imaging   Staging MRI pelvis  FINDINGS: TUMOR LOCATION Tumor distance from Anal Verge/Skin Surface:  11.4 cm Tumor distance to Internal Anal Sphincter: 6.5 cm TUMOR DESCRIPTION Circumferential Extent: 100% Tumor Length: 8.3 cm T - CATEGORY Extension through Muscularis Propria: Yes>73m=T3d Shortest Distance of any tumor/node from Mesorectal Fascia: 0 mm, along the left lateral and posterior walls (tumor abuts the left lateral pelvic sidewall and sacrum) Extramural Vascular Invasion/Tumor Thrombus: No Invasion of Anterior Peritoneal Reflection: No Involvement of Adjacent Organs or Pelvic Sidewall: Yes, involving the left lateral pelvic sidewall =T4 Levator Ani Involvement: No N - CATEGORY Mesorectal Lymph Nodes >=567m 2=N1 Extra-mesorectal Lymphadenopathy: No Other:  None. IMPRESSION: Rectal adenocarcinoma T stage: T4 Rectal adenocarcinoma N stage:  N1 Distance from tumor to the internal anal sphincter is 6.5 cm.   06/18/2019 Initial Diagnosis   Rectal adenocarcinoma ([facility]  07/02/2019 -  10/08/2019 Chemotherapy   Total Neoadjuvant FOLFOX q2weeks starting 07/02/19-10/08/19,  8 cycles   07/13/2019 PET scan   IMPRESSION: 1. Known rectal mass is intensely hypermetabolic compatible with primary rectal adenocarcinoma. No findings of FDG avid nodal metastasis or distant metastatic disease. 2. Hyperdense kidney cysts are identified bilaterally favored to represent hemorrhagic cyst. 3. Nonobstructing left renal calculi. 4. Aortic atherosclerosis, in addition to lad coronary artery disease. Please note that although the presence of coronary artery calcium documents the presence of coronary artery disease, the severity of this disease and any potential stenosis cannot be assessed on this non-gated CT examination. Assessment for potential risk factor modification, dietary therapy or pharmacologic therapy may be warranted, if clinically indicated. Aortic Atherosclerosis (ICD10-I70.0). 5. Gallstones.   09/11/2019 - 09/14/2019 Hospital Admission   Acute AMS, seizure 09/11/19 -Developed acute altered mental status day after Cycle 6 chemo (09/11/19).  -He was admitted for work up, EEG showed epileptic spike. ID work up negative, MRI negative for metastasis.    09/11/2019 Imaging   MRI Brain  IMPRESSION: Incomplete study. Images obtained reveal no acute abnormality. Negative for acute infarct.   Atrophy and mild ventricular enlargement stable from prior studies.   09/11/2019 Imaging   CT head  IMPRESSION: Mild ventricular prominence with normal appearing sulci. Question early communicating hydrocephalus. Brain parenchyma appears unremarkable. No acute infarct. No mass or hemorrhage.   Foci of arterial vascular calcification noted. There is mucosal thickening in several ethmoid air cells.   There is probable cerumen in the right external auditory  canal.     09/23/2019 Imaging   CT AP  IMPRESSION: 1. Solid-appearing left eccentric upper rectal mass measures about 6.7 by 2.9 cm. This is  difficult to compare directly to the prior exam given differences in orientation and degree of distension of the rectum. There is some prominence of stool in the sigmoid colon and rectum on today's examination, but the mass is not obstructive. 2. Other imaging findings of potential clinical significance: Cholelithiasis. Complex bilateral renal lesions are similar to prior exams. Nonobstructive left nephrolithiasis. Mild prostatomegaly with nodular prominence the upper margin of the prostate gland. Lumbar spondylosis and degenerative disc disease causing multilevel foraminal impingement. 3. Aortic atherosclerosis.   Aortic Atherosclerosis (ICD10-I70.0).   11/02/2019 - 12/09/2019 Chemotherapy   Concurrent chemo RT with Oral Xeloda '1500mg'$  BID on days of RT starting 11/02/19-12/09/19   11/02/2019 - 12/09/2019 Radiation Therapy   Concurrent chemo RT by Dr Lisbeth Renshaw with Xeloda  starting 11/02/19-12/09/19   01/13/2020 Imaging   MRI at [facility] 1.  Reduced size of the previously demonstrated high rectal mass. The residual tumor measures 2.4 cm in maximal diameter and is primarily endoluminal. There is a similar amount of extramural disease spread into the left mesorectal space and presacral space.  2.  Improved appearance of lymphadenopathy with several conspicuous mesorectal and internal iliac lymph nodes.  3.  Small volume of nonspecific free fluid in the pelvis.   01/29/2020 Imaging   CT CAP  IMPRESSION: 1. Interval response to therapy of rectosigmoid junction primary. 2. No findings of metastatic disease within the chest, abdomen, or pelvis. 3. Small pulmonary nodules, similar and decreased as detailed above. Nonspecific. 4. Right-sided Port-A-Cath tip at high IVC. consider repositioning. 5. Cholelithiasis. 6. Left nephrolithiasis. 7. Bilateral renal lesions. A medial upper pole renal lesion is suspicious for renal cell carcinoma. This could be re-evaluated at follow-up or more entirely  characterized with routine outpatient pre and post contrast abdominal MRI.   02/08/2020 Surgery   LOWER ANTERIOR RESECTION RECTUM LAPAROSCOPIC by Dr Morton Stall at [facility]   Procedures   PR LAP,SURG,COLECTOMY, PARTIAL, W/ANAST   PR LAP, SURG MOBIL SPLENIC FL DUR PTL COLECTOMY   PR LAP, SURG ILEO/JEJUNO-STOMY   SIGMOID COLECTOMY LAPAROSCOPIC ASSISTED     02/08/2020 Pathology Results   Final Pathologic Diagnosis at [place], "IMA", EXCISION:              Two lymph nodes are negative for metastatic carcinoma (0/2).   B.  SIGMOID COLON AND RECTUM, LOWER ANTERIOR RESECTION: Residual well differentiated invasive adenocarcinoma of the rectum, arising from a tubulovillous adenoma with high grade dyplasia.               Size: 2 mm in greatest dimension.  Tumor invades the submucosa.  Negative for perineural and lymphovascular invasion. Resection margins are negative for malignancy or dysplasia.  Twenty-two lymph nodes are negative for metastatic carcinoma (0/22). Complete mesorectum.               See CAP cancer protocol summary and comment.   C.  PROXIMAL DOUGHNUT, RESECTION:                      Segment of benign colonic tissue.              Negative for malignancy or dyplasia.    D.  DISTAL DOUGHNUT, RESECTION:  Segment of benign colonic tissue.              Negative for malignancy or dyplasia.    PATHOLOGIC STAGE CLASSIFICATION (pTNM, AJCC 8th Edition)         TNM Descriptors:    y (post-treatment)     Primary Tumor (pT):    pT1     Regional Lymph Nodes (pN):    pN0      02/08/2020 Cancer Staging   Staging form: Colon and Rectum, AJCC 8th Edition - Pathologic stage from 02/08/2020: Stage I (ypT1, pN0, cM0) - Signed by Truitt Merle, MD on 04/06/2020 Stage prefix: Post-therapy Total positive nodes: 0 Residual tumor (R): R0 - None   07/05/2020 Imaging   CT C/A/P w/o contrast  IMPRESSION: 1. Mildly indistinct appearance of the fat and  gallbladder wall separation in the upper abdomen, of uncertain significance and in the setting of abundant gallstones in the gallbladder lumen. Correlate with any clinical signs of abdominal pain and consider further evaluation with ultrasound as warranted. 2. RIGHT-sided Port-A-Cath tip at the high IVC near the diaphragmatic hiatus. Could consider repositioning as warranted. 3. 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 Consider follow-up evaluation, renal protocol study with with MRI with and without contrast as outlined in previous imaging. Hemorrhagic and/or proteinaceous cysts elsewhere in the kidneys. 4. LEFT nephrolithiasis as before with lower pole calculus approximately 6 mm. 5. No signs of metastatic disease with abundant stool in the colon following partial colonic resection. Correlate with any signs of constipation. 6. 4 mm nodule in the superior segment of the RIGHT lower lobe is unchanged. Attention on follow-up 7. Small midline periumbilical hernia containing fat. 8. Aortic atherosclerosis.      INTERVAL HISTORY:  Carlos [name] is here for a follow up of rectal cancer. He was last seen by me on 07/07/20. He presents to the clinic accompanied by his sister. He reports continued, intermittent loose bowels and increased gas since his surgery. He notes he has tried Gas-X with no improvement. He reports he is trying to get back to work. He notes he had cataract surgery a little over a week ago and is now unable to read without glasses. He notes this will impact his work, as he works for the [employer]. He states he will have to get reading glasses until he can get his prescription glasses.   All other systems were reviewed with the patient and are negative.  MEDICAL HISTORY:  Past Medical History:  Diagnosis Date   Anxiety    Benign prostatic hyperplasia 03/30/2013   10/1 IMO update   Mild neurocognitive disorder of unclear etiology  01/23/2019   rectal ca dx'd 06/2016   Seizures ([place])     SURGICAL HISTORY: Past Surgical History:  Procedure Laterality Date   COLON SURGERY     HIP SURGERY     INTRAMEDULLARY (IM) NAIL INTERTROCHANTERIC Left 12/11/2019   Procedure: INTRAMEDULLARY (IM) NAIL INTERTROCHANTRIC;  Surgeon: Rod Can, MD;  Location: [facility];  Service: Orthopedics;  Laterality: Left;   IR REMOVAL TUN ACCESS W/ PORT W/O FL MOD SED  07/29/2020   PORTACATH PLACEMENT N/A 07/01/2019   Procedure: PORT ULTRASOUND GUIDED PLACEMENT;  Surgeon: Alphonsa Overall, MD;  Location: [facility];  Service: General;  Laterality: N/A;   PROSTATE SURGERY  2004   RECTAL SURGERY     TONSILECTOMY, ADENOIDECTOMY, BILATERAL MYRINGOTOMY AND TUBES      I have reviewed the social history and family  history with the patient and they are unchanged from previous note.  ALLERGIES:  has No Known Allergies.  MEDICATIONS:  Current Outpatient Medications  Medication Sig Dispense Refill   levETIRAcetam (KEPPRA) 1000 MG tablet Take 1 tablet twice a day (take with Levetiracetam '250mg'$  tablet for total of '1250mg'$  twice a day) 180 tablet 3   levETIRAcetam (KEPPRA) 250 MG tablet Take 1 tablet twice a day (take with Levetiracetam '1000mg'$  tablet for total of '1250mg'$  twice a day) 180 tablet 3   loperamide (IMODIUM) 2 MG capsule Take 1 capsule (2 mg total) by mouth every 6 (six) hours as needed for diarrhea or loose stools. 30 capsule 0   Multiple Vitamin (MULTIVITAMIN WITH MINERALS) TABS tablet Take 1 tablet by mouth daily.     NON FORMULARY Take 1 capsule by mouth daily. Mind works     psyllium (METAMUCIL) 58.6 % packet Take 1 packet by mouth daily.     No current facility-administered medications for this visit.    PHYSICAL EXAMINATION: ECOG PERFORMANCE STATUS: 1 - Symptomatic but completely ambulatory  Vitals:   10/07/20 1051  BP: (!) 142/74  Pulse: 64  Resp: 17  Temp: 97.9 F (36.6 C)  SpO2: 100%   Wt Readings from Last 3 Encounters:  10/07/20  132 lb 14.4 oz (60.3 kg)  07/07/20 126 lb 9.6 oz (57.4 kg)  07/03/20 126 lb (57.2 kg)     GENERAL:alert, no distress and comfortable SKIN: skin color, texture, turgor are normal, no rashes or significant lesions EYES: normal, Conjunctiva are pink and non-injected, sclera clear  NECK: supple, thyroid normal size, non-tender, without nodularity LYMPH:  no palpable lymphadenopathy in the cervical, axillary  LUNGS: clear to auscultation and percussion with normal breathing effort HEART: regular rate & rhythm and no murmurs and no lower extremity edema ABDOMEN:abdomen soft, non-tender and normal bowel sounds Musculoskeletal:no cyanosis of digits and no clubbing  NEURO: alert & oriented x 3 with fluent speech, no focal motor/sensory deficits  LABORATORY DATA:  I have reviewed the data as listed CBC Latest Ref Rng & Units 10/07/2020 07/05/2020 07/03/2020  WBC 4.0 - 10.5 K/uL 4.4 12.5(H) 14.1(H)  Hemoglobin 13.0 - 17.0 g/dL 12.5(L) 12.1(L) 12.8(L)  Hematocrit 39.0 - 52.0 % 37.9(L) 37.7(L) 38.0(L)  Platelets 150 - 400 K/uL 209 175 187     CMP Latest Ref Rng & Units 10/07/2020 07/05/2020 07/03/2020  Glucose 70 - 99 mg/dL 93 105(H) 111(H)  BUN 8 - 23 mg/dL '16 9 12  '$ Creatinine 0.61 - 1.24 mg/dL 0.84 0.65 0.60(L)  Sodium 135 - 145 mmol/L 143 135 129(L)  Potassium 3.5 - 5.1 mmol/L 4.1 4.0 4.4  Chloride 98 - 111 mmol/L 105 99 92(L)  CO2 22 - 32 mmol/L '28 26 30  '$ Calcium 8.9 - 10.3 mg/dL 9.6 9.0 9.3  Total Protein 6.5 - 8.1 g/dL 7.1 6.6 6.7  Total Bilirubin 0.3 - 1.2 mg/dL 0.5 0.8 0.8  Alkaline Phos 38 - 126 U/L 89 73 67  AST 15 - 41 U/L '17 30 29  '$ ALT 0 - 44 U/L '15 28 26      '$ RADIOGRAPHIC STUDIES: I have personally reviewed the radiological images as listed and agreed with the findings in the report. No results found.    Orders Placed This Encounter  Procedures   CT CHEST ABDOMEN PELVIS W CONTRAST    Standing Status:   Future    Standing Expiration Date:   10/07/2021    Order Specific  Question:   If indicated  for the ordered procedure, I authorize the administration of contrast media per Radiology protocol    Answer:   Yes    Order Specific Question:   Preferred imaging location?    Answer:   [facility]    Order Specific Question:   Release to patient    Answer:   Immediate    Order Specific Question:   Is Oral Contrast requested for this exam?    Answer:   Yes, Per Radiology protocol    Order Specific Question:   Reason for Exam (SYMPTOM  OR DIAGNOSIS REQUIRED)    Answer:   rule out recurrence   All questions were answered. The patient knows to call the clinic with any problems, questions or concerns. No barriers to learning was detected. The total time spent in the appointment was 30 minutes.     Truitt Merle, MD 10/07/2020   I, Wilburn Mylar, am acting as scribe for Truitt Merle, MD.   I have reviewed the above documentation for accuracy and completeness, and I agree with the above.

## 2020-10-17 ENCOUNTER — Ambulatory Visit: Payer: 59 | Admitting: Physician Assistant

## 2020-10-19 DIAGNOSIS — Z0000 Encounter for general adult medical examination without abnormal findings: Secondary | ICD-10-CM | POA: Diagnosis not present

## 2020-10-19 DIAGNOSIS — E785 Hyperlipidemia, unspecified: Secondary | ICD-10-CM | POA: Diagnosis not present

## 2020-10-19 DIAGNOSIS — R7309 Other abnormal glucose: Secondary | ICD-10-CM | POA: Diagnosis not present

## 2020-10-21 DIAGNOSIS — R413 Other amnesia: Secondary | ICD-10-CM | POA: Diagnosis not present

## 2020-10-21 DIAGNOSIS — C61 Malignant neoplasm of prostate: Secondary | ICD-10-CM | POA: Diagnosis not present

## 2020-10-21 DIAGNOSIS — R296 Repeated falls: Secondary | ICD-10-CM | POA: Diagnosis not present

## 2020-10-21 DIAGNOSIS — E785 Hyperlipidemia, unspecified: Secondary | ICD-10-CM | POA: Diagnosis not present

## 2020-10-21 DIAGNOSIS — G40909 Epilepsy, unspecified, not intractable, without status epilepticus: Secondary | ICD-10-CM | POA: Diagnosis not present

## 2020-10-21 DIAGNOSIS — Z0000 Encounter for general adult medical examination without abnormal findings: Secondary | ICD-10-CM | POA: Diagnosis not present

## 2020-10-21 DIAGNOSIS — Z23 Encounter for immunization: Secondary | ICD-10-CM | POA: Diagnosis not present

## 2020-10-21 DIAGNOSIS — Z85048 Personal history of other malignant neoplasm of rectum, rectosigmoid junction, and anus: Secondary | ICD-10-CM | POA: Diagnosis not present

## 2020-10-25 ENCOUNTER — Ambulatory Visit (INDEPENDENT_AMBULATORY_CARE_PROVIDER_SITE_OTHER): Payer: Medicare Other | Admitting: Physician Assistant

## 2020-10-25 ENCOUNTER — Encounter: Payer: Self-pay | Admitting: Physician Assistant

## 2020-10-25 ENCOUNTER — Other Ambulatory Visit: Payer: Self-pay

## 2020-10-25 VITALS — BP 118/74 | HR 95 | Resp 18 | Ht 64.0 in | Wt 134.0 lb

## 2020-10-25 DIAGNOSIS — G3184 Mild cognitive impairment, so stated: Secondary | ICD-10-CM

## 2020-10-25 DIAGNOSIS — G40009 Localization-related (focal) (partial) idiopathic epilepsy and epileptic syndromes with seizures of localized onset, not intractable, without status epilepticus: Secondary | ICD-10-CM | POA: Diagnosis not present

## 2020-10-25 DIAGNOSIS — R413 Other amnesia: Secondary | ICD-10-CM | POA: Diagnosis not present

## 2020-10-25 NOTE — Patient Instructions (Addendum)
It was a pleasure to see you today at our office.   Recommendations:  Meds: Follow up  after the neurocognitive testing in [place] [facility] to rule out seizures  Neurocognitive evaluation at our office prior to the appt with me  Mediterranean diet is recommended    RECOMMENDATIONS FOR ALL PATIENTS WITH MEMORY PROBLEMS: 1. Continue to exercise (Recommend 30 minutes of walking everyday, or 3 hours every week) 2. Increase social interactions - continue going to [place] and enjoy social gatherings with friends and family 3. Eat healthy, avoid fried foods and eat more fruits and vegetables 4. Maintain adequate blood pressure, blood sugar, and blood cholesterol level. Reducing the risk of stroke and cardiovascular disease also helps promoting better memory. 5. Avoid stressful situations. Live a simple life and avoid aggravations. Organize your time and prepare for the next day in anticipation. 6. Sleep well, avoid any interruptions of sleep and avoid any distractions in the bedroom that may interfere with adequate sleep quality 7. Avoid sugar, avoid sweets as there is a strong link between excessive sugar intake, diabetes, and cognitive impairment We discussed the Mediterranean diet, which has been shown to help patients reduce the risk of progressive memory disorders and reduces cardiovascular risk. This includes eating fish, eat fruits and green leafy vegetables, nuts like almonds and hazelnuts, walnuts, and also use olive oil. Avoid fast foods and fried foods as much as possible. Avoid sweets and sugar as sugar use has been linked to worsening of memory function.  There is always a concern of gradual progression of memory problems. If this is the case, then we may need to adjust level of care according to patient needs. Support, both to the patient and caregiver, should then be put into place.       Mediterranean Diet A Mediterranean diet refers to food and lifestyle choices that are based on  the traditions of countries located on the [employer]. This way of eating has been shown to help prevent certain conditions and improve outcomes for people who have chronic diseases, like kidney disease and heart disease. What are tips for following this plan? Lifestyle  Cook and eat meals together with your family, when possible. Drink enough fluid to keep your urine clear or pale yellow. Be physically active every day. This includes: Aerobic exercise like running or swimming. Leisure activities like gardening, walking, or housework. Get 7-8 hours of sleep each night. If recommended by your health care provider, drink red wine in moderation. This means 1 glass a day for nonpregnant women and 2 glasses a day for men. A glass of wine equals 5 oz (150 mL). Reading food labels  Check the serving size of packaged foods. For foods such as rice and pasta, the serving size refers to the amount of cooked product, not dry. Check the total fat in packaged foods. Avoid foods that have saturated fat or trans fats. Check the ingredients list for added sugars, such as corn syrup. Shopping  At the grocery store, buy most of your food from the areas near the walls of the store. This includes: Fresh fruits and vegetables (produce). Grains, beans, nuts, and seeds. Some of these may be available in unpackaged forms or large amounts (in bulk). Fresh seafood. Poultry and eggs. Low-fat dairy products. Buy whole ingredients instead of prepackaged foods. Buy fresh fruits and vegetables in-season from local farmers markets. Buy frozen fruits and vegetables in resealable bags. If you do not have access to quality fresh seafood, buy precooked  frozen shrimp or canned fish, such as tuna, salmon, or sardines. Buy small amounts of raw or cooked vegetables, salads, or olives from the deli or salad bar at your store. Stock your pantry so you always have certain foods on hand, such as olive oil, canned tuna, canned  tomatoes, rice, pasta, and beans. Cooking  Cook foods with extra-virgin olive oil instead of using butter or other vegetable oils. Have meat as a side dish, and have vegetables or grains as your main dish. This means having meat in small portions or adding small amounts of meat to foods like pasta or stew. Use beans or vegetables instead of meat in common dishes like chili or lasagna. Experiment with different cooking methods. Try roasting or broiling vegetables instead of steaming or sauteing them. Add frozen vegetables to soups, stews, pasta, or rice. Add nuts or seeds for added healthy fat at each meal. You can add these to yogurt, salads, or vegetable dishes. Marinate fish or vegetables using olive oil, lemon juice, garlic, and fresh herbs. Meal planning  Plan to eat 1 vegetarian meal one day each week. Try to work up to 2 vegetarian meals, if possible. Eat seafood 2 or more times a week. Have healthy snacks readily available, such as: Vegetable sticks with hummus. Greek yogurt. Fruit and nut trail mix. Eat balanced meals throughout the week. This includes: Fruit: 2-3 servings a day Vegetables: 4-5 servings a day Low-fat dairy: 2 servings a day Fish, poultry, or lean meat: 1 serving a day Beans and legumes: 2 or more servings a week Nuts and seeds: 1-2 servings a day Whole grains: 6-8 servings a day Extra-virgin olive oil: 3-4 servings a day Limit red meat and sweets to only a few servings a month What are my food choices? Mediterranean diet Recommended Grains: Whole-grain pasta. Brown rice. Bulgar wheat. Polenta. Couscous. Whole-wheat bread. [place]. Vegetables: Artichokes. Beets. Broccoli. Cabbage. Carrots. Eggplant. Green beans. Chard. Kale. Spinach. Onions. Leeks. Peas. Squash. Tomatoes. Peppers. Radishes. Fruits: Apples. Apricots. Avocado. Berries. Bananas. Cherries. Dates. Figs. Grapes. Lemons. Melon. Oranges. Peaches. Plums. Pomegranate. Meats and other protein  foods: Beans. Almonds. Sunflower seeds. Pine nuts. Peanuts. [place]. Salmon. Scallops. Shrimp. [place]. Tilapia. Clams. Oysters. Eggs. Dairy: Low-fat milk. Cheese. Greek yogurt. Beverages: Water. Red wine. Herbal tea. Fats and oils: Extra virgin olive oil. Avocado oil. Grape seed oil. Sweets and desserts: [place] yogurt with honey. Baked apples. Poached pears. Trail mix. Seasoning and other foods: Basil. Cilantro. Coriander. Cumin. Mint. Parsley. Sage. Rosemary. Tarragon. Garlic. Oregano. Thyme. Pepper. Balsalmic vinegar. Tahini. Hummus. Tomato sauce. Olives. Mushrooms. Limit these Grains: Prepackaged pasta or rice dishes. Prepackaged cereal with added sugar. Vegetables: Deep fried potatoes (french fries). Fruits: Fruit canned in syrup. Meats and other protein foods: Beef. Pork. Lamb. Poultry with skin. Hot dogs. Berniece Salines. Dairy: Ice cream. Sour cream. Whole milk. Beverages: Juice. Sugar-sweetened soft drinks. Beer. Liquor and spirits. Fats and oils: Butter. Canola oil. Vegetable oil. Beef fat (tallow). Lard. Sweets and desserts: Cookies. Cakes. Pies. Candy. Seasoning and other foods: Mayonnaise. Premade sauces and marinades. The items listed may not be a complete list. Talk with your dietitian about what dietary choices are right for you. Summary The Mediterranean diet includes both food and lifestyle choices. Eat a variety of fresh fruits and vegetables, beans, nuts, seeds, and whole grains. Limit the amount of red meat and sweets that you eat. Talk with your health care provider about whether it is safe for you to drink red wine in moderation. This means  1 glass a day for nonpregnant women and 2 glasses a day for men. A glass of wine equals 5 oz (150 mL). This information is not intended to replace advice given to you by your health care provider. Make sure you discuss any questions you have with your health care provider. Document Released: 09/15/2015 Document Revised: 10/18/2015 Document Reviewed:  09/15/2015 Elsevier Interactive Patient Education  2017 [employer].

## 2020-10-25 NOTE — Progress Notes (Signed)
Assessment/Plan:   Mild neurocognitive disorder of unclear etiology Pleasant 75 year old right-handed man with mild neurocognitive disorder of unclear etiology, which per neuropsychological testing, findings could suggest prominent left temporal dysfunction. Currently, the patient is not on [facility] by choice, he prefers over the counter products. MMSE 06/09/20 was 26/30=MoCA 21/30.  MoCA today is 21/30, with deficiencies in visuospatial executive 2/5, memory, attention, and delayed recall.  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  Informed by the [place] has been requesting regarding medical information for the patient, in order to determine if the patient can return to work at the facility.  In order to proceed, testing needs to be performed.  Recommendations are as follows:  Repeat neuropsychological testing and follow-up after the testing as the patient wishes to return back to the work.  In order to expedite this, testing will be performed at [facility] and the results will be sent to [place]. EEG to rule out any seizure activity   Localization-related epilepsy, temporal lobe This was diagnosed in a hospitalization for confusional episode on 09/14/2019 with EEG showed left frequent frontotemporal epileptiform discharges occasional right temporal epileptiform discharges. Last EEG was remarkable for known independent L and R  Temporal spikes, but negative for seizures .  He had another seizure in November 2021, with negative MRI of the brain for intracranial abnormalities.  Since that time, the patient has no further confusion or seizures.   Recommendations are as follows:  Continue levetiracetam 1000 mg twice daily, along with levetiracetam 250 mg bid total 1250 mg bid  Repeat EEG Discussed again [place] driving laws, will  revisit return to driving  Case discussed with Dr. Delice Lesch who agrees with the plan    Subjective:   ED Visits : 07/03/20  Confusion. Negative CT head   Carlos [name] was seen today in a 37-month follow-up for evaluation of mild cognitive impairment and for history of seizures.  He was last seen at our office on 06/06/2020.  "Carlos [name] "is a pleasant 75 year old right-handed man with a history of rectal adenocarcinoma s/p chemotherapy and radiation status post resection 02/08/20, history of seizures on levetiracetam 1000 mg twice daily, diagnosis of mild neurocognitive disorder. This patient is accompanied in the office by his sister Carlos [name] who supplements the history.  Previous records as well as any outside records available were reviewed prior to todays visit.  He is not on donepezil, "since MCI is not dementia according to the sister ".  He is on OTC memory supplement at this time.The patient is now fully recovered from his history of colon cancer as per note from 10/07/2020, Dr. Burr Medico, without clinical concern for recurrence, and scheduled for restaging CT on December 2022.  The patient in today's visit has brought some paperwork, dated on October 14, 2020, in which the [place] has serious concerns about Carlos [name] medical condition.  He went back to work without having a prior repeat neurocognitive testing as recommended, and he got lost driving to work.  He reported to work anywhere from 30 minutes to 2-1/2 hours late due to him getting lost while driving.  He admits that having gotten scared, he "came up with some excuse telling then I have a brain tumor and that is why I could not make it to work on time ".  According to the note, "Carlos [name] lives his operation and cannot find his way back.  Carlos [name] is going to different operations where he is not supposed to be because he does not know how to get back to operation he left from ".  "[employer] management is having to walk the building looking  for Carlos [name] because he never returned to where he supposed to be ".  "Carlos [name] having this type of issues is a serious concern from the [place] for several reasons.  We need to know that he is fit to work.  He has paperwork that we need to have completed and sent to occupational health nurse administrator before he is allowed to report back ".   The patient feels that his memory is "about the same ", occasionally losing keys, also repeating at times the same questions.  Otherwise, he does not see any decrease in his performance.  Throughout the visit, repetitively, he stated that he is doing well, and he does not have any problems, and that he wants to go back to work ". He has been somewhat anxious, denies hallucinations or paranoia.  He sleeps "okay " denying REM behavior or sleepwalking.  He denies headaches, traumas or injuries to the head.  He has been in the ER with confusion in May of this year, but with a CT showing no acute findings.  No seizure activity is reported by him, denies aura, taste changes, dizziness, or body jerks.  No neck back pain, or falls.  He denies any numbness, tingling, tremors, or unilateral weakness.  He still has not checked his vision with ophthalmology.   His appetite is good, denies any constipation or diarrhea, occasional bloating.  He is now healed from his abdominal surgery.  He does not cook.  He denies any urinary complaints.  He ambulates without difficulty without the use of a walker or a cane. He is independent of dressing and bathing.  He continues to pay his bills, although at times he does not open them and the sister has to pay them.    History on Initial Assessment 11/07/2018: This is a pleasant 75 year old right-handed man with no significant past medical history presenting for evaluation of memory loss and frequent falls. His sister is present to provide additional information. He thinks his memory is reasonable. He states family has  noticed more changes, they ask him things and "I don't give good answers." He endorses short-term memory changes. He lives with his 20 year old mother who has 4 caregivers. 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 He denies getting lost driving. He denies any missed bill payments. He is not on any medications. He denies any difficulties at work, he states it is routine work. His sister started noticing changes a year ago, he would forget conversations. He would forget he had already finished a task, for instance that he had already came from the grocery store. She feels symptoms worsened in the past 5-6 months. Last month, he was visiting another sister and forgot the last portion of the trip, he did not get to his destination and just went home. He has told family he has gotten lost coming home from work, he arrived home an hour later (he has been in the same place for 7 years). His family is also concerned about an increase in frequency of falls. He has had 3 falls in the past 6 months. He reports injuring his  foot and hitting his head with a fall in April, then hurting his back in July. He thinks it is due to clumsiness. No loss of consciousness. They are concerned about the possibility of a brain tumor, their father had a brain tumor. There is a history of memory loss in his mother and younger brother (secondary to stroke). He denies any history of significant head injuries or alcohol use.    He denies any headaches, dizziness, diplopia, dysarthria, dysphagia, neck/back pain, focal numbness/tingling/weakness, bowel/bladder dysfunction, anosmia, or tremors. He felt unwell last night and BP was 151/86, he went home early from work. He states he is on his feet 12 hours a day, and he is now required to work 6 days a week (mandatory due to Covid-19 changes), he was previously working 5 days a week. His sister is concerned he is not eating well, mostly sugars and hotdogs. He is overly tired and sleepy some days.  Mood is good, he has "always been hyper, but now it more" per sister. No hallucinations or paranoia.    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 There were "impairments in retrieval/consolidation aspects of verbal memory, working memory, confrontation naming, and complex visuoconstructional abilities. Additional variability was seen across processing speed and executive functioning. Visual encoding (i.e., learning) was also a relative weakness. Findings could suggest prominent left temporal dysfunction assuming normal lateralization in this right-handed individual. However, other aspects of expressive language (i.e., verbal fluency) were within normal limits. The aforementioned deficits, coupled with additional weaknesses across aspects of executive functioning and more advanced visuoconstructional abilities could be concerning for Alzheimer's disease. However appropriate retrieval/consolidation aspects of visual memory, coupled with strong semantic fluency scores, is inconsistent with what would typically be expected."  PREVIOUS MEDICATIONS: None  CURRENT MEDICATIONS:  Outpatient Encounter Medications as of 10/25/2020  Medication Sig   levETIRAcetam (KEPPRA) 1000 MG tablet Take 1 tablet twice a day (take with Levetiracetam 250mg  tablet for total of 1250mg  twice a day)   levETIRAcetam (KEPPRA) 250 MG tablet Take 1 tablet twice a day (take with Levetiracetam 1000mg  tablet for total of 1250mg  twice a day)   loperamide (IMODIUM) 2 MG capsule Take 1 capsule (2 mg total) by mouth every 6 (six) hours as needed for diarrhea or loose stools.   Multiple Vitamin (MULTIVITAMIN WITH MINERALS) TABS tablet Take 1 tablet by mouth daily.   NON  FORMULARY Take 1 capsule by mouth daily. Mind works   psyllium (METAMUCIL) 58.6 % packet Take 1 packet by mouth daily.   No facility-administered encounter medications on file as of 10/25/2020.     Objective:     PHYSICAL EXAMINATION:    VITALS:   Vitals:   10/25/20 1436  BP: 118/74  Pulse: 95  Resp: 18  SpO2: 100%  Weight: 134 lb (60.8 kg)  Height: 5\' 4"  (1.626 m)     GEN:  The patient appears stated age and is in NAD, anxious appearing HEENT:  Normocephalic, atraumatic.   Neurological examination:  Orientation: The patient is alert. Oriented to person, place and date  Cranial nerves: There is good facial symmetry.The speech is fluent and clear. No aphasia or dysarthria. Fund of knowledge is appropriate. Recent and remote memory are slightly impaired. Attention and concentration are mildly reduced. Able to  name objects and repeat phrases.  Hearing is intact to conversational tone.  Sensation: Sensation is intact to light touch throughout Motor: Strength is at least antigravity x4.  Montreal Cognitive Assessment  10/25/2020 11/07/2018  Visuospatial/ Executive (0/5) 2 3  Naming (0/3) 3 3  Attention: Read list of digits (0/2) 1 2  Attention: Read list of letters (0/1) 1 1  Attention: Serial 7 subtraction starting at 100 (0/3) 3 3  Language: Repeat phrase (0/2) 2 2  Language : Fluency (0/1) 1 0  Abstraction (0/2) 2 2  Delayed Recall (0/5) 0 0  Orientation (0/6) 6 6  Total 21 22  Adjusted Score (based on education) 21 22    MMSE - Mini Mental State Exam 06/06/2020  Orientation to time 5  Orientation to Place 5  Registration 3  Attention/ Calculation 3  Recall 2  Language- name 2 objects 2  Language- repeat 1  Language- follow 3 step command 3  Language- read & follow direction 1  Write a sentence 1  Copy design 0  Total score 26     Movement examination: Tone: There is normal tone in the UE/LE Abnormal movements:  no tremor.  No myoclonus.  No asterixis.    Coordination:  There is no decremation with RAM's.  Gait and Station: The patient has no difficulty arising out of a deep-seated chair without the use of the hands. The patient's stride length is good.  Gait is cautious and narrow.    CBC CBC Latest Ref Rng & Units 10/07/2020 07/05/2020 07/03/2020  WBC 4.0 - 10.5 K/uL 4.4 12.5(H) 14.1(H)  Hemoglobin 13.0 - 17.0 g/dL 12.5(L) 12.1(L) 12.8(L)  Hematocrit 39.0 - 52.0 % 37.9(L) 37.7(L) 38.0(L)  Platelets 150 - 400 K/uL 209 175 187     CMP Latest Ref Rng & Units 10/07/2020 07/05/2020 07/03/2020  Glucose 70 - 99 mg/dL 93 105(H) 111(H)  BUN 8 - 23 mg/dL 16 9 12   Creatinine 0.61 - 1.24 mg/dL 0.84 0.65 0.60(L)  Sodium 135 - 145 mmol/L 143 135 129(L)  Potassium 3.5 - 5.1 mmol/L 4.1 4.0 4.4  Chloride 98 - 111 mmol/L 105 99 92(L)  CO2 22 - 32 mmol/L 28 26 30   Calcium 8.9 - 10.3 mg/dL 9.6 9.0 9.3  Total Protein 6.5 - 8.1 g/dL 7.1 6.6 6.7  Total Bilirubin 0.3 - 1.2 mg/dL 0.5 0.8 0.8  Alkaline Phos 38 - 126 U/L 89 73 67  AST 15 - 41 U/L 17 30 29   ALT 0 - 44 U/L 15 28 26        Total time spent on today's visit was 60 minutes, including both face-to-face time and nonface-to-face time.  Time included that spent on review of records (prior notes available to me/labs/imaging if pertinent), discussing treatment and goals, answering patient's questions and coordinating care.  Cc:  [name], MD Sharene Butters, PA-C

## 2020-10-26 ENCOUNTER — Ambulatory Visit (INDEPENDENT_AMBULATORY_CARE_PROVIDER_SITE_OTHER): Payer: Medicare Other | Admitting: Neurology

## 2020-10-26 DIAGNOSIS — G40009 Localization-related (focal) (partial) idiopathic epilepsy and epileptic syndromes with seizures of localized onset, not intractable, without status epilepticus: Secondary | ICD-10-CM

## 2020-10-26 DIAGNOSIS — R413 Other amnesia: Secondary | ICD-10-CM | POA: Diagnosis not present

## 2020-10-26 HISTORY — DX: Localization-related (focal) (partial) idiopathic epilepsy and epileptic syndromes with seizures of localized onset, not intractable, without status epilepticus: G40.009

## 2020-10-27 ENCOUNTER — Other Ambulatory Visit: Payer: Self-pay

## 2020-10-27 ENCOUNTER — Telehealth: Payer: Self-pay | Admitting: Physician Assistant

## 2020-10-27 DIAGNOSIS — Z23 Encounter for immunization: Secondary | ICD-10-CM | POA: Diagnosis not present

## 2020-10-27 DIAGNOSIS — G3184 Mild cognitive impairment, so stated: Secondary | ICD-10-CM

## 2020-10-27 DIAGNOSIS — R413 Other amnesia: Secondary | ICD-10-CM

## 2020-10-27 NOTE — Procedures (Signed)
ELECTROENCEPHALOGRAM REPORT  Date of Study: 10/26/2020  Patient's Name: Carlos [name] MRN: [id number] Date of Birth: [birth year]  Referring Provider: Sharene Butters, PA-C  Clinical History: This is a 75 year old man with memory loss, left temporal lobe epilepsy with worsening memory. EEG to assess for subclinical seizures  Medications: KEPPRA 1000 MG tablet KEPPRA 250 MG tablet IMODIUM 2 MG capsule MULTIVITAMIN WITH MINERALS TABS tablet Mind works  NON FORMULARY METAMUCIL 58.6 % packet  Technical Summary: A multichannel digital EEG recording measured by the international 10-20 system with electrodes applied with paste and impedances below 5000 ohms performed in our laboratory with EKG monitoring in an awake and asleep patient.  Hyperventilation was not performed. Photic stimulation was performed.  The digital EEG was referentially recorded, reformatted, and digitally filtered in a variety of bipolar and referential montages for optimal display.    Description: The patient is awake and asleep during the recording.  During maximal wakefulness, there is a symmetric, medium voltage 8.5 Hz posterior dominant rhythm that attenuates with eye opening.  The record is symmetric.  During drowsiness and sleep, there is an increase in theta slowing of the background.  Vertex waves and symmetric sleep spindles were seen.  Photic stimulation did not elicit any abnormalities.  There were no epileptiform discharges or electrographic seizures seen.    EKG lead was unremarkable.  Impression: This awake and asleep EEG is normal.    Clinical Correlation: A normal EEG does not exclude a clinical diagnosis of epilepsy.  If further clinical questions remain, prolonged EEG may be helpful.  Clinical correlation is advised.   Ellouise Newer, M.D.

## 2020-10-27 NOTE — Telephone Encounter (Signed)
Sent referral to [facility]

## 2020-10-28 ENCOUNTER — Ambulatory Visit: Payer: 59 | Admitting: Neurology

## 2020-11-01 ENCOUNTER — Telehealth: Payer: Self-pay | Admitting: Physician Assistant

## 2020-11-01 ENCOUNTER — Other Ambulatory Visit: Payer: Self-pay

## 2020-11-01 DIAGNOSIS — G3184 Mild cognitive impairment, so stated: Secondary | ICD-10-CM

## 2020-11-01 DIAGNOSIS — R413 Other amnesia: Secondary | ICD-10-CM

## 2020-11-01 NOTE — Telephone Encounter (Signed)
Patient's sister called in and left a message. She stated there was a referral done for neuropsychologist testing and the place in [place] couldn't get him in until November. She said they are holding his job for him and cannot wait until November to find out if he can go back to work. She said she has not heard back from the place in [place].

## 2020-11-01 NOTE — Telephone Encounter (Signed)
Patient's sister, Ms. Banks, called and said the new facility for neuropsych needs the 2020 neuropsychology report.  He has his next testing on 11/14/20 there.  Fax: [phone] attn: Hoyle Sauer in [facility]

## 2020-11-01 NOTE — Telephone Encounter (Signed)
Sent referral with notes faxed.

## 2020-11-01 NOTE — Telephone Encounter (Signed)
Carlos [name] from [facility]  called and left a VM stating that she will have to have a copy of the EEG and the prior nueropsych testing that was done sent to them ASAP. They have to have any prior neuropsych testing sent with a referral. She states that she has gotten the patient down for Friday and on 11-08-20    Fax number [phone] Telephone [phone] opt [phone]  Please send ASAP

## 2020-11-03 ENCOUNTER — Ambulatory Visit: Payer: Medicare Other | Admitting: Physician Assistant

## 2020-11-08 DIAGNOSIS — H02403 Unspecified ptosis of bilateral eyelids: Secondary | ICD-10-CM | POA: Diagnosis not present

## 2020-11-08 DIAGNOSIS — H02834 Dermatochalasis of left upper eyelid: Secondary | ICD-10-CM | POA: Diagnosis not present

## 2020-11-08 DIAGNOSIS — Z01818 Encounter for other preprocedural examination: Secondary | ICD-10-CM | POA: Diagnosis not present

## 2020-11-08 DIAGNOSIS — H02831 Dermatochalasis of right upper eyelid: Secondary | ICD-10-CM | POA: Diagnosis not present

## 2020-11-14 ENCOUNTER — Other Ambulatory Visit: Payer: Self-pay

## 2020-11-14 DIAGNOSIS — R413 Other amnesia: Secondary | ICD-10-CM | POA: Diagnosis not present

## 2020-11-14 NOTE — Progress Notes (Signed)
Returned pt's sister's phone call regarding CT Scan restaging.  Per Dr. Ernestina Penna clinic note she wants the pt to have scan prior to his next appt with her on 01/06/2021.  Referral for scan placed by Dr. Burr Medico on 10/07/2020.  Provided pt's sister with telephone number to Central Scheduling for scan [phone] so she can schedule his scan.

## 2020-11-16 DIAGNOSIS — R413 Other amnesia: Secondary | ICD-10-CM | POA: Diagnosis not present

## 2020-11-23 DIAGNOSIS — H02831 Dermatochalasis of right upper eyelid: Secondary | ICD-10-CM | POA: Diagnosis not present

## 2020-11-23 DIAGNOSIS — H02403 Unspecified ptosis of bilateral eyelids: Secondary | ICD-10-CM | POA: Diagnosis not present

## 2020-11-23 DIAGNOSIS — H02401 Unspecified ptosis of right eyelid: Secondary | ICD-10-CM | POA: Diagnosis not present

## 2020-11-23 DIAGNOSIS — H02402 Unspecified ptosis of left eyelid: Secondary | ICD-10-CM | POA: Diagnosis not present

## 2020-11-23 DIAGNOSIS — H02834 Dermatochalasis of left upper eyelid: Secondary | ICD-10-CM | POA: Diagnosis not present

## 2020-11-24 ENCOUNTER — Telehealth: Payer: Self-pay | Admitting: Physician Assistant

## 2020-11-24 NOTE — Telephone Encounter (Signed)
Advised to follow up after testing

## 2020-11-24 NOTE — Telephone Encounter (Signed)
Pt's sister called in and left a message wanting to find out how long it would take to get the results of the neuropsych testing in [place]?

## 2020-11-28 ENCOUNTER — Encounter: Payer: Self-pay | Admitting: Hematology

## 2020-11-28 DIAGNOSIS — M25512 Pain in left shoulder: Secondary | ICD-10-CM | POA: Diagnosis not present

## 2020-12-06 DIAGNOSIS — G301 Alzheimer's disease with late onset: Secondary | ICD-10-CM | POA: Diagnosis not present

## 2020-12-06 DIAGNOSIS — F02A0 Dementia in other diseases classified elsewhere, mild, without behavioral disturbance, psychotic disturbance, mood disturbance, and anxiety: Secondary | ICD-10-CM | POA: Diagnosis not present

## 2020-12-06 DIAGNOSIS — R413 Other amnesia: Secondary | ICD-10-CM | POA: Diagnosis not present

## 2020-12-07 ENCOUNTER — Telehealth: Payer: Self-pay | Admitting: Neurology

## 2020-12-07 NOTE — Telephone Encounter (Signed)
[name] called to see if Tiran needs to come in to be seen. She wants to see about the medication that aquino talked about (aricept) also stated papers were being sent over to be reviewed, which we have recvd. Would like a call to discuss

## 2020-12-08 NOTE — Telephone Encounter (Signed)
Pt's sister called in again wanting to follow up, she never heard back from anyone. I went ahead and scheduled a follow up with Clarise Cruz on 12/13/20.

## 2020-12-13 ENCOUNTER — Other Ambulatory Visit: Payer: Self-pay

## 2020-12-13 ENCOUNTER — Encounter: Payer: Self-pay | Admitting: Physician Assistant

## 2020-12-13 ENCOUNTER — Ambulatory Visit (INDEPENDENT_AMBULATORY_CARE_PROVIDER_SITE_OTHER): Payer: Medicare Other | Admitting: Physician Assistant

## 2020-12-13 VITALS — BP 161/82 | HR 90 | Resp 18 | Ht 64.0 in | Wt 138.0 lb

## 2020-12-13 DIAGNOSIS — F0390 Unspecified dementia without behavioral disturbance: Secondary | ICD-10-CM | POA: Diagnosis not present

## 2020-12-13 DIAGNOSIS — G40009 Localization-related (focal) (partial) idiopathic epilepsy and epileptic syndromes with seizures of localized onset, not intractable, without status epilepticus: Secondary | ICD-10-CM | POA: Diagnosis not present

## 2020-12-13 MED ORDER — DONEPEZIL HCL 10 MG PO TABS: ORAL_TABLET | ORAL | 11 refills | Status: DC

## 2020-12-13 NOTE — Patient Instructions (Signed)
It was a pleasure to see you today at our office.   Recommendations:  Meds: Follow up in 3  months We will start donepezil half tablet (5mg ) daily for 2  weeks.  If you are tolerating the medication, then after 2 weeks, we will increase the dose to a full tablet of 10 mg daily.  Side effects include nausea, vomiting, diarrhea, vivid dreams, and muscle cramps.  Please call the clinic if you experience any of these symptoms.  Referral to Functional Therapy Will write letter to the [facility] WITH MEMORY PROBLEMS: 1. Continue to exercise (Recommend 30 minutes of walking everyday, or 3 hours every week) 2. Increase social interactions - continue going to [place] and enjoy social gatherings with friends and family 3. Eat healthy, avoid fried foods and eat more fruits and vegetables 4. Maintain adequate blood pressure, blood sugar, and blood cholesterol level. Reducing the risk of stroke and cardiovascular disease also helps promoting better memory. 5. Avoid stressful situations. Live a simple life and avoid aggravations. Organize your time and prepare for the next day in anticipation. 6. Sleep well, avoid any interruptions of sleep and avoid any distractions in the bedroom that may interfere with adequate sleep quality 7. Avoid sugar, avoid sweets as there is a strong link between excessive sugar intake, diabetes, and cognitive impairment We discussed the Mediterranean diet, which has been shown to help patients reduce the risk of progressive memory disorders and reduces cardiovascular risk. This includes eating fish, eat fruits and green leafy vegetables, nuts like almonds and hazelnuts, walnuts, and also use olive oil. Avoid fast foods and fried foods as much as possible. Avoid sweets and sugar as sugar use has been linked to worsening of memory function.  There is always a concern of gradual progression of memory problems. If this is the case, then we may need  to adjust level of care according to patient needs. Support, both to the patient and caregiver, should then be put into place.    The Alzheimer's Association is here all day, every day for people facing Alzheimer's disease through our free 24/7 Helpline: [phone]) [phone]. The Helpline provides reliable information and support to all those who need assistance, such as individuals living with memory loss, Alzheimer's or other dementia, caregivers, health care professionals and the public.  Our highly trained and knowledgeable staff can help you with: Understanding memory loss, dementia and Alzheimer's  Medications and other treatment options  General information about aging and brain health  Skills to provide quality care and to find the best care from professionals  Legal, financial and living-arrangement decisions Our Helpline also features: Confidential care consultation provided by master's level clinicians who can help with decision-making support, crisis assistance and education on issues families face every day  Help in a caller's preferred language using our translation service that features more than 200 languages and dialects  Referrals to local community programs, services and ongoing support     FALL PRECAUTIONS: Be cautious when walking. Scan the area for obstacles that may increase the risk of trips and falls. When getting up in the mornings, sit up at the edge of the bed for a few minutes before getting out of bed. Consider elevating the bed at the head end to avoid drop of blood pressure when getting up. Walk always in a well-lit room (use night lights in the walls). Avoid area rugs or power cords from appliances in the middle of the walkways.  Use a walker or a cane if necessary and consider physical therapy for balance exercise. Get your eyesight checked regularly.  FINANCIAL OVERSIGHT: Supervision, especially oversight when making financial decisions or transactions is also  recommended.  HOME SAFETY: Consider the safety of the kitchen when operating appliances like stoves, microwave oven, and blender. Consider having supervision and share cooking responsibilities until no longer able to participate in those. Accidents with firearms and other hazards in the house should be identified and addressed as well.   ABILITY TO BE LEFT ALONE: If patient is unable to contact 911 operator, consider using LifeLine, or when the need is there, arrange for someone to stay with patients. Smoking is a fire hazard, consider supervision or cessation. Risk of wandering should be assessed by caregiver and if detected at any point, supervision and safe proof recommendations should be instituted.  MEDICATION SUPERVISION: Inability to self-administer medication needs to be constantly addressed. Implement a mechanism to ensure safe administration of the medications.   DRIVING: Regarding driving, in patients with progressive memory problems, driving will be impaired. We advise to have someone else do the driving if trouble finding directions or if minor accidents are reported. Independent driving assessment is available to determine safety of driving.   If you are interested in the driving assessment, you can contact the following:  [employer] in [place]  [place] [phone] or [phone]      [place] refers to food and lifestyle choices that are based on the traditions of countries located on the [employer]. This way of eating has been shown to help prevent certain conditions and improve outcomes for people who have chronic diseases, like kidney disease and heart disease. What are tips for following this plan? Lifestyle  Cook and eat meals together with your family, when possible. Drink enough fluid to keep your urine  clear or pale yellow. Be physically active every day. This includes: Aerobic exercise like running or swimming. Leisure activities like gardening, walking, or housework. Get 7-8 hours of sleep each night. If recommended by your health care provider, drink red wine in moderation. This means 1 glass a day for nonpregnant women and 2 glasses a day for men. A glass of wine equals 5 oz (150 mL). Reading food labels  Check the serving size of packaged foods. For foods such as rice and pasta, the serving size refers to the amount of cooked product, not dry. Check the total fat in packaged foods. Avoid foods that have saturated fat or trans fats. Check the ingredients list for added sugars, such as corn syrup. Shopping  At the grocery store, buy most of your food from the areas near the walls of the store. This includes: Fresh fruits and vegetables (produce). Grains, beans, nuts, and seeds. Some of these may be available in unpackaged forms or large amounts (in bulk). Fresh seafood. Poultry and eggs. Low-fat dairy products. Buy whole ingredients instead of prepackaged foods. Buy fresh fruits and vegetables in-season from local farmers markets. Buy frozen fruits and vegetables in resealable bags. If you do not have access to quality fresh seafood, buy precooked frozen shrimp or canned fish, such as tuna, salmon, or sardines. Buy small amounts of raw or cooked vegetables, salads, or olives from the deli or salad bar at your store. Stock your pantry so you always have certain foods on hand, such as olive oil, canned tuna, canned  tomatoes, rice, pasta, and beans. Cooking  Cook foods with extra-virgin olive oil instead of using butter or other vegetable oils. Have meat as a side dish, and have vegetables or grains as your main dish. This means having meat in small portions or adding small amounts of meat to foods like pasta or stew. Use beans or vegetables instead of meat in common dishes like chili or  lasagna. Experiment with different cooking methods. Try roasting or broiling vegetables instead of steaming or sauteing them. Add frozen vegetables to soups, stews, pasta, or rice. Add nuts or seeds for added healthy fat at each meal. You can add these to yogurt, salads, or vegetable dishes. Marinate fish or vegetables using olive oil, lemon juice, garlic, and fresh herbs. Meal planning  Plan to eat 1 vegetarian meal one day each week. Try to work up to 2 vegetarian meals, if possible. Eat seafood 2 or more times a week. Have healthy snacks readily available, such as: Vegetable sticks with hummus. Greek yogurt. Fruit and nut trail mix. Eat balanced meals throughout the week. This includes: Fruit: 2-3 servings a day Vegetables: 4-5 servings a day Low-fat dairy: 2 servings a day Fish, poultry, or lean meat: 1 serving a day Beans and legumes: 2 or more servings a week Nuts and seeds: 1-2 servings a day Whole grains: 6-8 servings a day Extra-virgin olive oil: 3-4 servings a day Limit red meat and sweets to only a few servings a month What are my food choices? Mediterranean diet Recommended Grains: Whole-grain pasta. Brown rice. Bulgar wheat. Polenta. Couscous. Whole-wheat bread. [place]. Vegetables: Artichokes. Beets. Broccoli. Cabbage. Carrots. Eggplant. Green beans. Chard. Kale. Spinach. Onions. Leeks. Peas. Squash. Tomatoes. Peppers. Radishes. Fruits: Apples. Apricots. Avocado. Berries. Bananas. Cherries. Dates. Figs. Grapes. Lemons. Melon. Oranges. Peaches. Plums. Pomegranate. Meats and other protein foods: Beans. Almonds. Sunflower seeds. Pine nuts. Peanuts. [place]. Salmon. Scallops. Shrimp. [place]. Tilapia. Clams. Oysters. Eggs. Dairy: Low-fat milk. Cheese. Greek yogurt. Beverages: Water. Red wine. Herbal tea. Fats and oils: Extra virgin olive oil. Avocado oil. Grape seed oil. Sweets and desserts: [place] yogurt with honey. Baked apples. Poached pears. Trail mix. Seasoning and  other foods: Basil. Cilantro. Coriander. Cumin. Mint. Parsley. Sage. Rosemary. Tarragon. Garlic. Oregano. Thyme. Pepper. Balsalmic vinegar. Tahini. Hummus. Tomato sauce. Olives. Mushrooms. Limit these Grains: Prepackaged pasta or rice dishes. Prepackaged cereal with added sugar. Vegetables: Deep fried potatoes (french fries). Fruits: Fruit canned in syrup. Meats and other protein foods: Beef. Pork. Lamb. Poultry with skin. Hot dogs. Berniece Salines. Dairy: Ice cream. Sour cream. Whole milk. Beverages: Juice. Sugar-sweetened soft drinks. Beer. Liquor and spirits. Fats and oils: Butter. Canola oil. Vegetable oil. Beef fat (tallow). Lard. Sweets and desserts: Cookies. Cakes. Pies. Candy. Seasoning and other foods: Mayonnaise. Premade sauces and marinades. The items listed may not be a complete list. Talk with your dietitian about what dietary choices are right for you. Summary The Mediterranean diet includes both food and lifestyle choices. Eat a variety of fresh fruits and vegetables, beans, nuts, seeds, and whole grains. Limit the amount of red meat and sweets that you eat. Talk with your health care provider about whether it is safe for you to drink red wine in moderation. This means 1 glass a day for nonpregnant women and 2 glasses a day for men. A glass of wine equals 5 oz (150 mL). This information is not intended to replace advice given to you by your health care provider. Make sure you discuss any questions you have with your  health care provider. Document Released: 09/15/2015 Document Revised: 10/18/2015 Document Reviewed: 09/15/2015 Elsevier Interactive Patient Education  2017 [employer].

## 2020-12-13 NOTE — Telephone Encounter (Signed)
Sister going to call for records

## 2020-12-13 NOTE — Progress Notes (Addendum)
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  After evaluation, with the proper accommodations, he should be able to perform uncomplicated tasks, no driving or manipulation of heavy machinery is indicated.  The patient will be referred to functional therapy for further details of what type of task he can perform to his potential.  A letter to [employer] was written for that effect. Follow-up in 6 months  Localization-related epilepsy, temporal lobe This was diagnosed in a hospitalization for confusional episode on 09/14/2019 with EEG showed left frequent frontotemporal epileptiform discharges occasional right temporal epileptiform discharges. Last EEG was remarkable for known independent L and R  Temporal spikes, but negative for seizures .  He had another seizure in November 2021, with negative MRI of the brain for intracranial abnormalities.  Last EEG was normal. Since that time, the patient has no further confusion or seizures. Continue levetiracetam 1000 mg twice daily, along with levetiracetam 250 mg bid total 1250 mg bid   Case discussed with Dr. Delice Lesch who agrees with the plan    Subjective:     Carlos [name] is a very pleasant  75 year old returns for a follow-up visit, last seen in this office on 08/06/2020 at which time his MoCA was 21/30.  Since that time, he underwent a neuropsychological evaluation at [facility] neuropsychology services, yielding a diagnosis of Major neurocognitive disorder, likely multifactorial including Alzheimer's, vascular, and history of chemo and radiation. Since his last visit, the patient feels that he is doing well.  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   Throughout the visit, he continuously repeats that all he wants to do is go back to [employer]. Is somewhat anxious, but denies any hallucinations or paranoia.  He sleeps well, denying REM behavior or sleepwalking. He denies headaches, traumas or injuries to the head.  No seizure activity is reported Denies aura, taste changes, dizziness, or body jerks.  No neck back pain, or falls.  He denies any numbness, tingling, tremors, or unilateral weakness. His appetite is good, denies any constipation or diarrhea, occasional bloating.  He is now healed from his abdominal surgery.  He does not cook.  He denies any urinary complaints.  He ambulates without difficulty without the use of a walker or a cane. He is independent of dressing and bathing.  He continues to pay his bills, although at times he does not open them and the sister has to pay them.    History on Initial Assessment 11/07/2018: This is a pleasant 75 year old right-handed man with no significant past medical history presenting for evaluation of memory loss and frequent falls. His sister is present to provide additional information. He thinks his memory is reasonable. He states family has noticed more changes, they ask him things and "I don't give good answers." He endorses short-term memory changes. He lives with his 78 year old mother who has 4 caregivers. 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 He denies getting lost driving. He denies any missed bill payments. He is not on any medications. He denies any difficulties at work, he states it is routine work. His sister started noticing changes a year ago, he would forget conversations.  He would forget he had already finished a task, for instance that he had already came from the grocery store. She feels symptoms worsened in the past 5-6 months. Last month, he was visiting another sister and forgot the last portion of the trip, he did not get to his destination and just went home. He has told family he has  gotten lost coming home from work, he arrived home an hour later (he has been in the same place for 7 years). His family is also concerned about an increase in frequency of falls. He has had 3 falls in the past 6 months. He reports injuring his foot and hitting his head with a fall in April, then hurting his back in July. He thinks it is due to clumsiness. No loss of consciousness. They are concerned about the possibility of a brain tumor, their father had a brain tumor. There is a history of memory loss in his mother and younger brother (secondary to stroke). He denies any history of significant head injuries or alcohol use.    He denies any headaches, dizziness, diplopia, dysarthria, dysphagia, neck/back pain, focal numbness/tingling/weakness, bowel/bladder dysfunction, anosmia, or tremors. He felt unwell last night and BP was 151/86, he went home early from work. He states he is on his feet 12 hours a day, and he is now required to work 6 days a week (mandatory due to Covid-19 changes), he was previously working 5 days a week. His sister is concerned he is not eating well, mostly sugars and hotdogs. He is overly tired and sleepy some days. Mood is good, he has "always been hyper, but now it more" per sister. No hallucinations or paranoia.    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 There were "impairments in retrieval/consolidation aspects of verbal memory, working memory, confrontation naming, and complex visuoconstructional abilities. Additional variability was seen across processing speed and executive functioning. Visual encoding (i.e., learning) was also a relative weakness. Findings could suggest prominent left temporal dysfunction  assuming normal lateralization in this right-handed individual. However, other aspects of expressive language (i.e., verbal fluency) were within normal limits. The aforementioned deficits, coupled with additional weaknesses across aspects of executive functioning and more advanced visuoconstructional abilities could be concerning for Alzheimer's disease. However appropriate retrieval/consolidation aspects of visual memory, coupled with strong semantic fluency scores, is inconsistent with what would typically be expected."    PREVIOUS MEDICATIONS: None  CURRENT MEDICATIONS:  Outpatient Encounter Medications as of 12/13/2020  Medication Sig   donepezil (ARICEPT) 10 MG tablet Take half tablet (5 mg) daily for 2 weeks, then increase to the full tablet at 10 mg daily   levETIRAcetam (KEPPRA) 1000 MG tablet Take 1 tablet twice a day (take with Levetiracetam 250mg  tablet for total of 1250mg  twice a day)   levETIRAcetam (KEPPRA) 250 MG tablet Take 1 tablet twice a day (take with Levetiracetam 1000mg  tablet for total of 1250mg  twice a day)   loperamide (IMODIUM) 2 MG capsule Take 1 capsule (2 mg total) by mouth every 6 (six) hours as needed for diarrhea or loose stools.   Multiple Vitamin (MULTIVITAMIN WITH MINERALS) TABS tablet Take 1 tablet by mouth daily.   NON FORMULARY Take 1 capsule by mouth daily. Mind works   psyllium (METAMUCIL) 58.6 % packet  Take 1 packet by mouth daily.   No facility-administered encounter medications on file as of 12/13/2020.     Objective:     PHYSICAL EXAMINATION:    VITALS:   Vitals:   12/13/20 1120  BP: (!) 161/82  Pulse: 90  Resp: 18  SpO2: 100%  Weight: 138 lb (62.6 kg)  Height: 5\' 4"  (1.626 m)      GEN:  The patient appears stated age and is in NAD, anxious appearing HEENT:  Normocephalic, atraumatic.   Neurological examination:  Orientation: The patient is alert. Oriented to person, place and date  Cranial nerves: There is good facial  symmetry.The speech is fluent and clear. No aphasia or dysarthria. Fund of knowledge is appropriate. Recent and remote memory are slightly impaired. Attention and concentration are mildly reduced. Able to name objects and repeat phrases.  Hearing is intact to conversational tone.  Sensation: Sensation is intact to light touch throughout Motor: Strength is at least antigravity x4.  Montreal Cognitive Assessment  10/25/2020 11/07/2018  Visuospatial/ Executive (0/5) 2 3  Naming (0/3) 3 3  Attention: Read list of digits (0/2) 1 2  Attention: Read list of letters (0/1) 1 1  Attention: Serial 7 subtraction starting at 100 (0/3) 3 3  Language: Repeat phrase (0/2) 2 2  Language : Fluency (0/1) 1 0  Abstraction (0/2) 2 2  Delayed Recall (0/5) 0 0  Orientation (0/6) 6 6  Total 21 22  Adjusted Score (based on education) 21 22    MMSE - Mini Mental State Exam 06/06/2020  Orientation to time 5  Orientation to Place 5  Registration 3  Attention/ Calculation 3  Recall 2  Language- name 2 objects 2  Language- repeat 1  Language- follow 3 step command 3  Language- read & follow direction 1  Write a sentence 1  Copy design 0  Total score 26     Movement examination: Tone: There is normal tone in the UE/LE Abnormal movements:  no tremor.  No myoclonus.  No asterixis.   Coordination:  There is no decremation with RAM's.  Gait and Station: The patient has no difficulty arising out of a deep-seated chair without the use of the hands. The patient's stride length is good.  Gait is cautious and narrow.      Total time spent on today's visit was 90 minutes, including both face-to-face time and nonface-to-face time.  Time included that spent on review of records (prior notes available to me/labs/imaging if pertinent), discussing treatment and goals, answering patient's questions and coordinating care.  Cc:  [name], MD Sharene Butters, PA-C

## 2020-12-22 DIAGNOSIS — Z08 Encounter for follow-up examination after completed treatment for malignant neoplasm: Secondary | ICD-10-CM | POA: Diagnosis not present

## 2020-12-22 DIAGNOSIS — Z85048 Personal history of other malignant neoplasm of rectum, rectosigmoid junction, and anus: Secondary | ICD-10-CM | POA: Diagnosis not present

## 2020-12-22 DIAGNOSIS — C20 Malignant neoplasm of rectum: Secondary | ICD-10-CM | POA: Diagnosis not present

## 2021-01-02 ENCOUNTER — Telehealth: Payer: Self-pay | Admitting: Neurology

## 2021-01-02 ENCOUNTER — Telehealth: Payer: Self-pay

## 2021-01-02 NOTE — Telephone Encounter (Signed)
Okay to leave a detailed message if caller not reached, per Ms. Banks.

## 2021-01-02 NOTE — Telephone Encounter (Signed)
Spoke with pt sister Mrs Volanda Napoleon informed her that per Clarise Cruz PA he can stay on 5 mg of Aricept  until next visit or if memory noted to be  worse. Pt sister verbalized understanding she also stated that pt has COVID at this time,

## 2021-01-02 NOTE — Telephone Encounter (Signed)
Pt's sister called stating pt has COVID and has an elevated temperature.  Pt's sister wanted to know should they take the pt [facility] or come here.  Returned pt's sister's call and LVM instructing pt's sister to follow back up with the pt's PCP or go to [facility].  Informed pt's sister to give Tylenol or Ibuprofen for the pt's elevated temperature and keep pt hydrated.  Instructed pt's sister to contact our office should she have further questions or concerns.

## 2021-01-02 NOTE — Telephone Encounter (Signed)
Patient's sister called and said she'd like to know if Dr. Delice Lesch thinks it may be okay for the patient to stay on the Aricept 5 MG for a little longer?   She said she is worried about the patient getting stomach upset at an increased dose.

## 2021-01-03 ENCOUNTER — Ambulatory Visit (HOSPITAL_COMMUNITY): Payer: Medicare Other

## 2021-01-03 ENCOUNTER — Other Ambulatory Visit: Payer: Medicare Other

## 2021-01-04 ENCOUNTER — Telehealth: Payer: Self-pay | Admitting: Hematology

## 2021-01-04 NOTE — Telephone Encounter (Signed)
Called and left msg for patient sister per sch msg. Advised to call back if changes need to be made

## 2021-01-06 ENCOUNTER — Ambulatory Visit: Payer: Medicare Other | Admitting: Hematology

## 2021-01-09 DIAGNOSIS — Z20822 Contact with and (suspected) exposure to covid-19: Secondary | ICD-10-CM | POA: Diagnosis not present

## 2021-01-26 ENCOUNTER — Other Ambulatory Visit: Payer: PRIVATE HEALTH INSURANCE

## 2021-01-26 ENCOUNTER — Ambulatory Visit (HOSPITAL_COMMUNITY): Payer: Medicare Other

## 2021-02-02 ENCOUNTER — Ambulatory Visit: Payer: PRIVATE HEALTH INSURANCE | Admitting: Hematology

## 2021-02-20 ENCOUNTER — Encounter (HOSPITAL_COMMUNITY): Payer: Self-pay

## 2021-02-20 ENCOUNTER — Encounter: Payer: Self-pay | Admitting: Hematology

## 2021-02-20 ENCOUNTER — Ambulatory Visit (HOSPITAL_COMMUNITY)
Admission: RE | Admit: 2021-02-20 | Discharge: 2021-02-20 | Disposition: A | Discharge status: Home / Self Care | Payer: Medicare Other | Source: Ambulatory Visit | Attending: Hematology | Admitting: Hematology

## 2021-02-20 DIAGNOSIS — I700 Atherosclerosis of aorta: Secondary | ICD-10-CM | POA: Diagnosis not present

## 2021-02-20 DIAGNOSIS — K5730 Diverticulosis of large intestine without perforation or abscess without bleeding: Secondary | ICD-10-CM | POA: Diagnosis not present

## 2021-02-20 DIAGNOSIS — M533 Sacrococcygeal disorders, not elsewhere classified: Secondary | ICD-10-CM | POA: Diagnosis not present

## 2021-02-20 DIAGNOSIS — C20 Malignant neoplasm of rectum: Secondary | ICD-10-CM | POA: Insufficient documentation

## 2021-02-20 DIAGNOSIS — N200 Calculus of kidney: Secondary | ICD-10-CM | POA: Diagnosis not present

## 2021-02-20 DIAGNOSIS — N281 Cyst of kidney, acquired: Secondary | ICD-10-CM | POA: Diagnosis not present

## 2021-02-20 DIAGNOSIS — K8020 Calculus of gallbladder without cholecystitis without obstruction: Secondary | ICD-10-CM | POA: Diagnosis not present

## 2021-02-20 DIAGNOSIS — J9811 Atelectasis: Secondary | ICD-10-CM | POA: Diagnosis not present

## 2021-02-20 DIAGNOSIS — I2510 Atherosclerotic heart disease of native coronary artery without angina pectoris: Secondary | ICD-10-CM | POA: Diagnosis not present

## 2021-02-20 LAB — POCT I-STAT CREATININE: Creatinine, Ser: 0.9 mg/dL (ref 0.61–1.24)

## 2021-02-20 IMAGING — CT CT CHEST-ABD-PELV W/ CM
2 of 5 series · 12 of 36 positions shown, 14 images · IV contrast (OMNIPAQUE)
Comparison: Multiple priors including most recent CT [DATE]

CLINICAL DATA: History of colorectal cancer status post partial
colectomy, chemo and radiation.

EXAM:
CT CHEST, ABDOMEN, AND PELVIS WITH CONTRAST
TECHNIQUE: Multidetector CT imaging of the chest, abdomen and pelvis was
performed following the standard protocol during bolus
administration of intravenous contrast.

[Series 2: cap with · axial · 0.70mm/px · z∈[-544,-74]mm · 9 of 118 slices shown, 11 images]
[im 12/118  mediastinal]
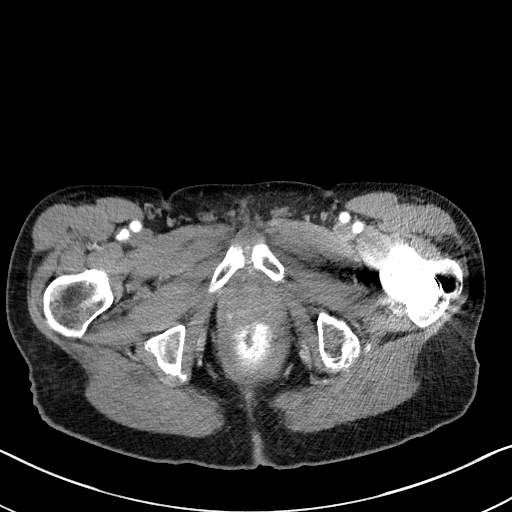
[im 12/118  bone]
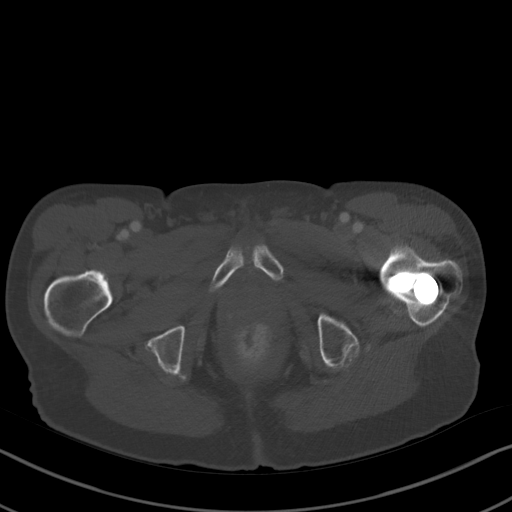
[im 24/118  mediastinal]
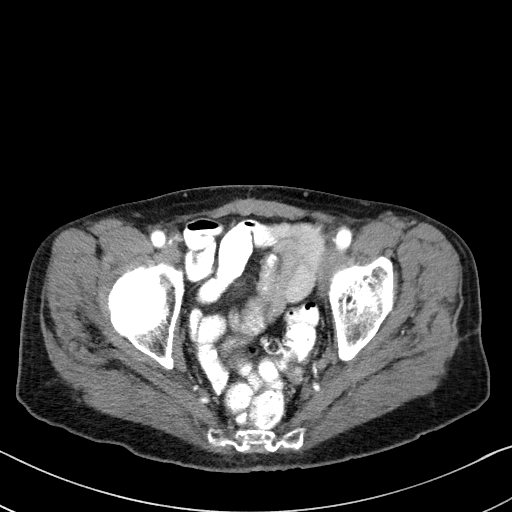
[im 36/118  mediastinal]
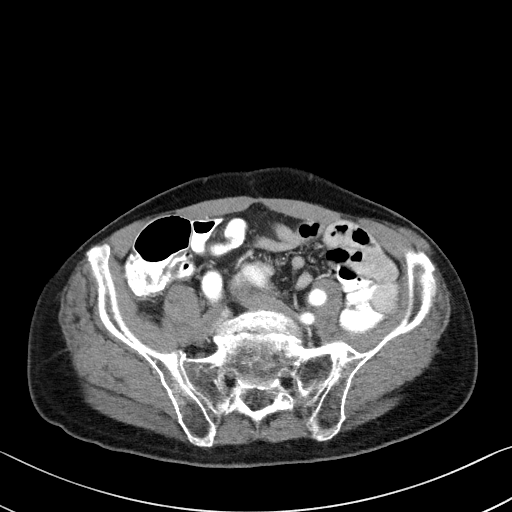
[im 47/118  mediastinal]
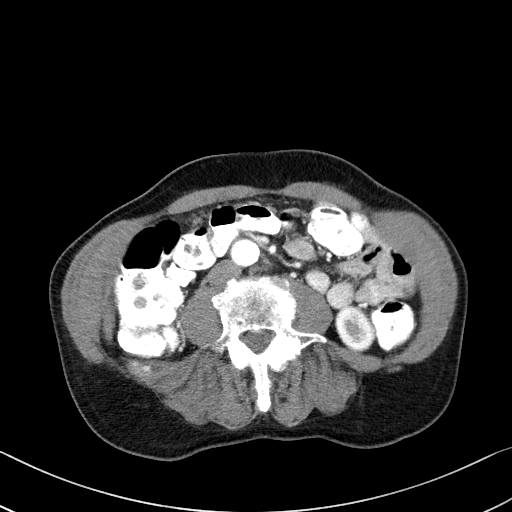
[im 59/118  mediastinal]
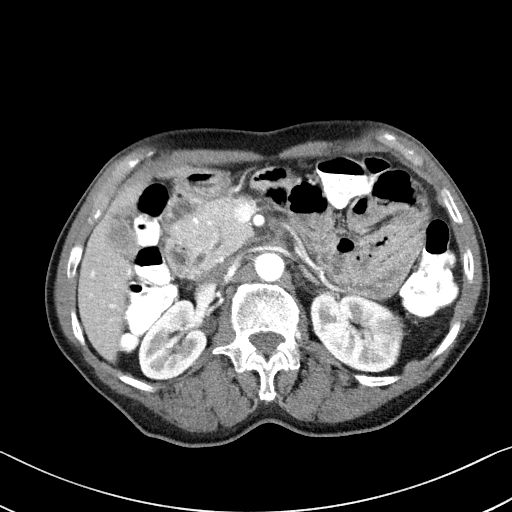
[im 71/118  mediastinal]
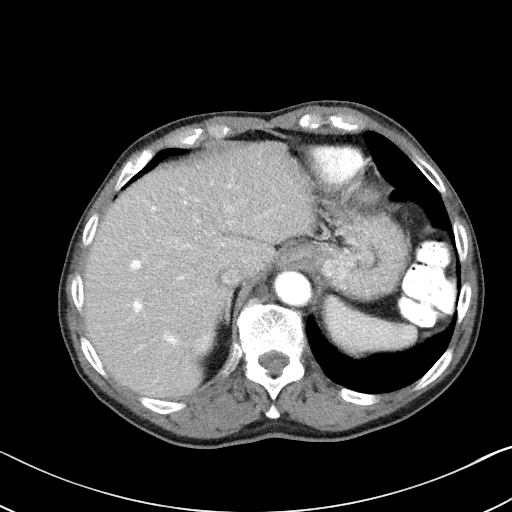
[im 82/118  mediastinal]
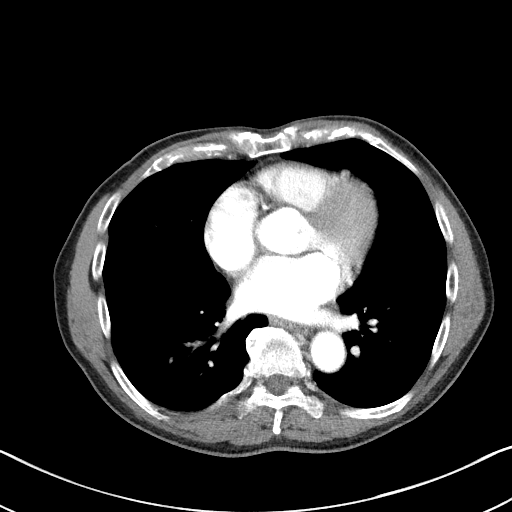
[im 94/118  mediastinal]
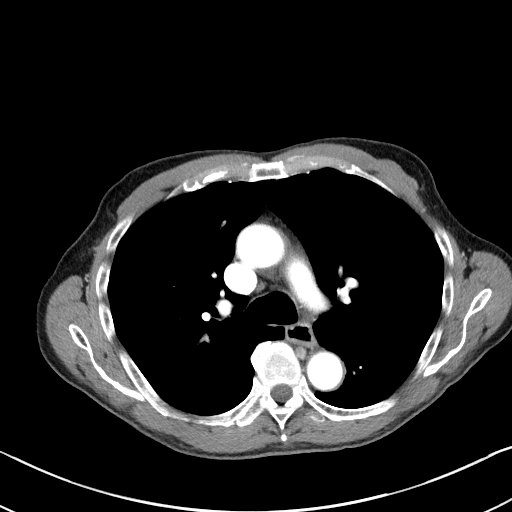
[im 106/118  mediastinal]
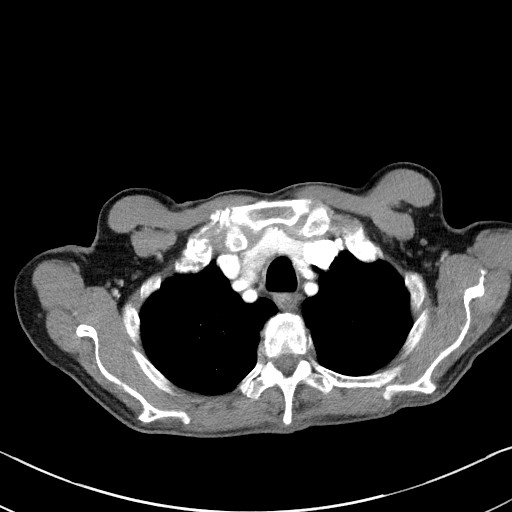
[im 106/118  bone]
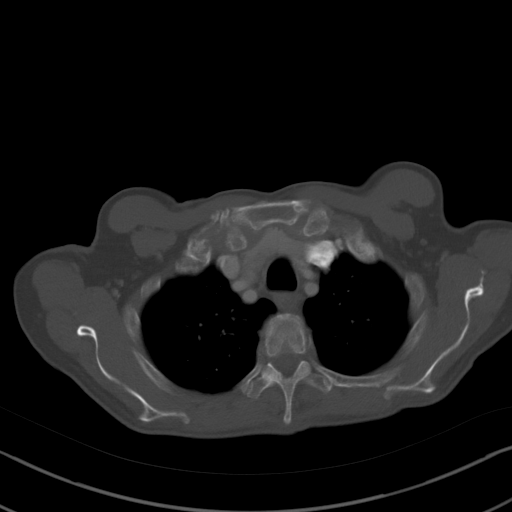

[Series 4: coronals · coronal · 0.70mm/px · 3 of 138 slices shown]
[im 28/138  mediastinal]
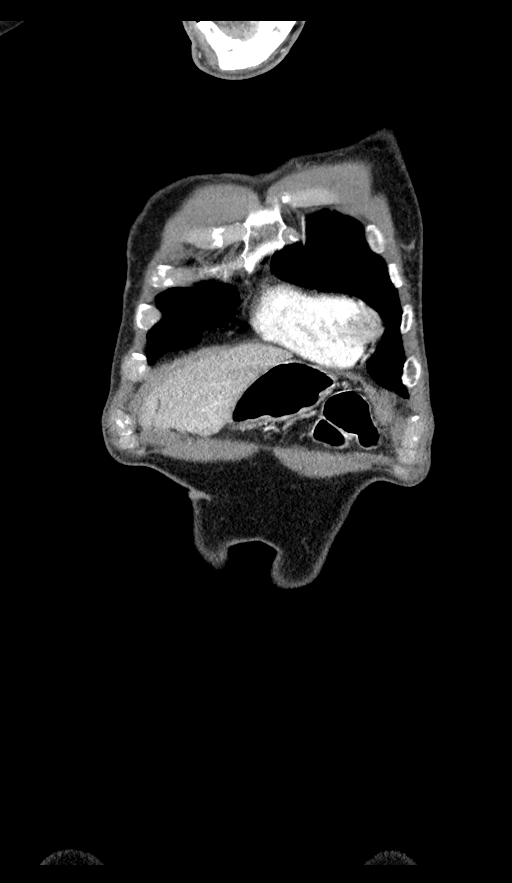
[im 55/138  mediastinal]
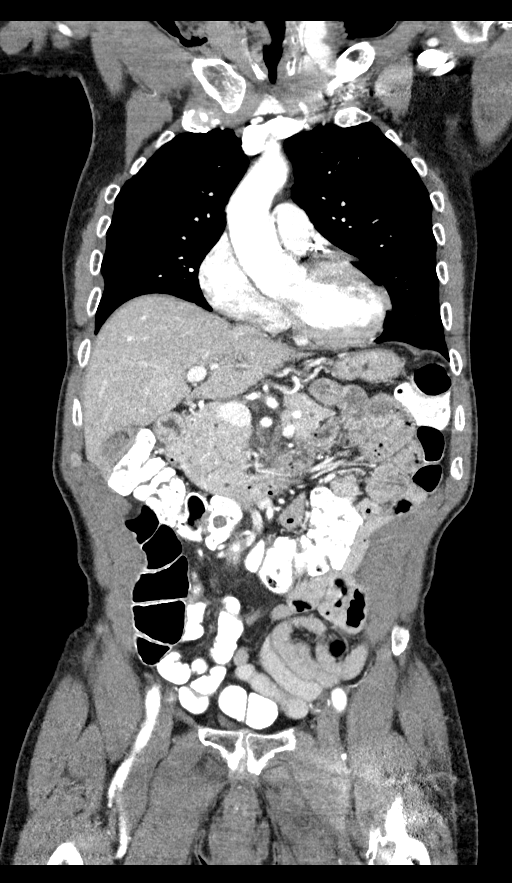
[im 83/138  mediastinal]
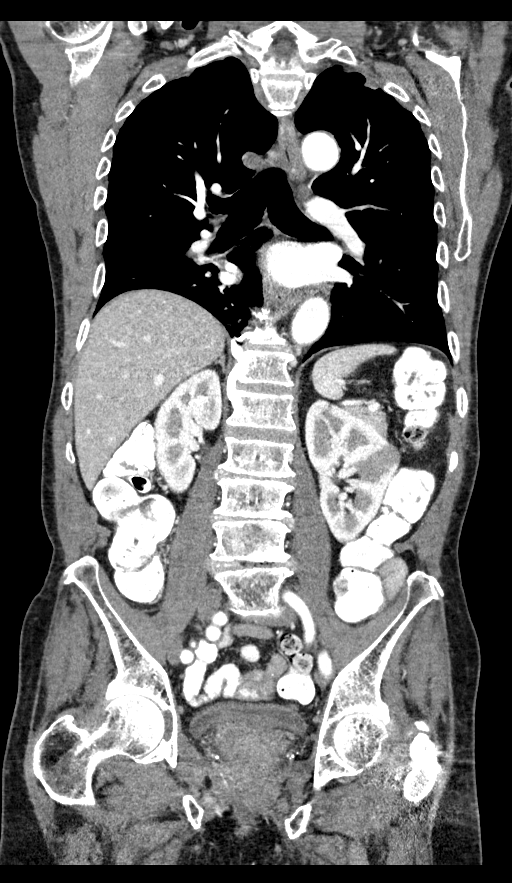

[12 of 36 positions shown; findings below may reference images not displayed]

RADIATION DOSE REDUCTION: This exam was performed according to the
departmental dose-optimization program which includes automated
exposure control, adjustment of the mA and/or kV according to
patient size and/or use of iterative reconstruction technique.

CONTRAST:  100mL OMNIPAQUE IOHEXOL 300 MG/ML  SOLN
FINDINGS: CT CHEST FINDINGS

Cardiovascular: Interval removal of the right chest Port-A-Cath.
Minimal aortic atherosclerosis without thoracic aortic aneurysm. No
central pulmonary embolus on this nondedicated study. Coronary
artery calcifications. Normal size heart. No significant pericardial
effusion/thickening.

Mediastinum/Nodes: No supraclavicular adenopathy. No suspicious
thyroid nodularity. No pathologically enlarged mediastinal, hilar or
axillary lymph nodes. Patulous esophagus.

Lungs/Pleura: Bibasilar atelectasis. Stable 4 mm nodule in the
superior segment of the right lower lobe on image 67/6, stable
dating back to most remote available prior imaging CT [DATE]. No new suspicious pulmonary nodules or masses. No pleural
effusion. No pneumothorax.

Musculoskeletal: No aggressive lytic or blastic lesion of bone.
Similar appearance of the T5 compression deformity. No acute osseous
abnormality.

CT ABDOMEN PELVIS FINDINGS

Hepatobiliary: No suspicious hepatic lesion. Cholelithiasis without
findings of acute cholecystitis. No biliary ductal dilation.

Pancreas: No pancreatic ductal dilation or evidence of acute
inflammation.

Spleen: Normal in size without focal abnormality.

Adrenals/Urinary Tract: Bilateral adrenal glands appear normal.

No hydronephrosis. Punctate nonobstructive left lower pole renal
stone.

Again seen is the heterogeneous right upper pole renal lesion which
measures 2.4 x 2.0 cm on image 54/2, which is unchanged in size
dating back to [DATE]. In retrospect on prior PET-CT there
were some faint areas of mildly increased FDG uptake within the
lesion along the anterior and posterior margins, which correspond
with areas of probable enhancing tissue on today's study, and is
very suspicious for a solid renal neoplasm.

Unchanged size of the intermediate density lesion with homogeneous
characteristics in the interpolar left kidney which has Hounsfield
units of 88 and measures 2.3 cm on image 62/2. Hypodense right upper
pole renal cyst.

Urinary bladder is unremarkable for degree of distension.

Stomach/Bowel: Radiopaque enteric contrast material traverses the
rectum. Stomach is unremarkable for degree of distension. No
pathologic dilation of small or large bowel. The appendix and
terminal ileum appear normal. Sigmoid colonic diverticulosis without
findings of acute diverticulitis. Rectosigmoid anastomotic sutures
without suspicious soft tissue along the suture line. No suspicious
colonic wall thickening or mass like lesions identified.

Vascular/Lymphatic: Minimal aortic atherosclerosis. No abdominal
aortic aneurysm. No pathologically enlarged abdominal or pelvic
lymph nodes.

Reproductive: Prostate is unremarkable.

Other: No abdominopelvic free fluid. No discrete omental or
peritoneal nodularity.

Musculoskeletal: Proximal left femoral fixation hardware.
Degenerative change of the SI joints with partial left bony
ankylosis. Multilevel degenerative changes spine. No aggressive
lytic or blastic lesion of bone.
IMPRESSION: 1. Stable examination without evidence of metastatic disease within
the chest, abdomen, or pelvis.
2. Heterogeneous 2.4 cm right upper pole renal lesion, unchanged in
size dating back to [DATE], is very suspicious for a solid
renal neoplasm. Urology consult suggested if not previously
performed.
3. Unchanged size of the intermediate density lesion in the
interpolar left kidney, likely a proteinaceous or hemorrhagic cyst.
4. Cholelithiasis without findings of acute cholecystitis.
5. Sigmoid colonic diverticulosis without findings of acute
diverticulitis.
6.  Aortic Atherosclerosis ([QG]-[QG]).

## 2021-02-20 MED ORDER — SODIUM CHLORIDE (PF) 0.9 % IJ SOLN
INTRAMUSCULAR | Status: AC
  Filled 2021-02-20: qty 50

## 2021-02-20 MED ORDER — IOHEXOL 300 MG/ML  SOLN
100.0000 mL | Freq: Once | INTRAMUSCULAR | Status: AC | PRN
  Administered 2021-02-20: 100 mL via INTRAVENOUS

## 2021-02-27 ENCOUNTER — Other Ambulatory Visit: Payer: Self-pay

## 2021-02-27 ENCOUNTER — Inpatient Hospital Stay (HOSPITAL_BASED_OUTPATIENT_CLINIC_OR_DEPARTMENT_OTHER): Payer: Medicare Other | Admitting: Hematology

## 2021-02-27 ENCOUNTER — Inpatient Hospital Stay: Payer: Medicare Other | Attending: Hematology

## 2021-02-27 VITALS — BP 138/72 | HR 80 | Temp 98.5°F | Resp 15 | Wt 134.8 lb

## 2021-02-27 DIAGNOSIS — C20 Malignant neoplasm of rectum: Secondary | ICD-10-CM

## 2021-02-27 DIAGNOSIS — Z79899 Other long term (current) drug therapy: Secondary | ICD-10-CM | POA: Insufficient documentation

## 2021-02-27 LAB — CBC WITH DIFFERENTIAL (CANCER CENTER ONLY)
Abs Immature Granulocytes: 0.03 10*3/uL (ref 0.00–0.07)
Basophils Absolute: 0 10*3/uL (ref 0.0–0.1)
Basophils Relative: 0 %
Eosinophils Absolute: 0 10*3/uL (ref 0.0–0.5)
Eosinophils Relative: 1 %
HCT: 39.2 % (ref 39.0–52.0)
Hemoglobin: 12.8 g/dL — ABNORMAL LOW (ref 13.0–17.0)
Immature Granulocytes: 0 %
Lymphocytes Relative: 13 %
Lymphs Abs: 0.9 10*3/uL (ref 0.7–4.0)
MCH: 28.8 pg (ref 26.0–34.0)
MCHC: 32.7 g/dL (ref 30.0–36.0)
MCV: 88.1 fL (ref 80.0–100.0)
Monocytes Absolute: 0.6 10*3/uL (ref 0.1–1.0)
Monocytes Relative: 9 %
Neutro Abs: 5.1 10*3/uL (ref 1.7–7.7)
Neutrophils Relative %: 77 %
Platelet Count: 195 10*3/uL (ref 150–400)
RBC: 4.45 MIL/uL (ref 4.22–5.81)
RDW: 13.1 % (ref 11.5–15.5)
WBC Count: 6.7 10*3/uL (ref 4.0–10.5)
nRBC: 0 % (ref 0.0–0.2)

## 2021-02-27 LAB — CMP (CANCER CENTER ONLY)
ALT: 16 U/L (ref 0–44)
AST: 22 U/L (ref 15–41)
Albumin: 4.1 g/dL (ref 3.5–5.0)
Alkaline Phosphatase: 101 U/L (ref 38–126)
Anion gap: 8 (ref 5–15)
BUN: 11 mg/dL (ref 8–23)
CO2: 25 mmol/L (ref 22–32)
Calcium: 8.8 mg/dL — ABNORMAL LOW (ref 8.9–10.3)
Chloride: 106 mmol/L (ref 98–111)
Creatinine: 0.71 mg/dL (ref 0.61–1.24)
GFR, Estimated: >60 mL/min (ref >60)
Glucose, Bld: 117 mg/dL — ABNORMAL HIGH (ref 70–99)
Potassium: 3.8 mmol/L (ref 3.5–5.1)
Sodium: 139 mmol/L (ref 135–145)
Total Bilirubin: 0.6 mg/dL (ref 0.3–1.2)
Total Protein: 7.2 g/dL (ref 6.5–8.1)

## 2021-02-27 NOTE — Progress Notes (Unsigned)
Per Dr. Burr Medico sent last office note and CT Chest Scan report to [facility] and requested colonoscopy report from Dr. Therisa Doyne at [facility].

## 2021-02-27 NOTE — Progress Notes (Signed)
[place]   [phone] [phone]   Clinic Follow up Note   Patient Care Team: [name], MD as PCP - General (Family Medicine) Cameron Sprang, MD as Consulting Physician (Neurology) Truitt Merle, MD as Consulting Physician (Hematology) Ronnette Juniper, MD as Consulting Physician (Gastroenterology) Elease Hashimoto (Neurology)  Date of Service:  02/27/2021  CHIEF COMPLAINT: f/u of rectal cancer  CURRENT THERAPY:  Surveillance  ASSESSMENT & PLAN:  Carlos [name] is a 76 y.o. male with   1. Adenocarcinoma of the rectum, cT4N1M0 stage III, ypT1aN0  -He was initially diagnosed in 05/2019 with locally advanced stage III rectal cancer and treated with total neoadjuvant chemotherapy FOLFOX 07/02/19-10/08/19 and concurrent chemoRT with Xeloda 1500mg  BID  11/02/19-12/07/19. He missed last 2 treatments due to hospitalization for Seizure and left femur fracture.  -He underwent low anterior resection surgery with Dr Morton Stall at [facility] on 02/08/20. Surgical pathology showed complete surgical resection with mild T1 residual disease, LN negative.  -s/p ostomy take down on 05/11/20 under Dr. Morton Stall.  -port removed on 07/29/20 due to malfunction and position  -Per pt report, he had repeat colonoscopy with Dr. Therisa Doyne in 09/2020. We do not have this report; they report this was good. -CT CAP 02/20/21 showed NED. I reviewed the images and discussed the results with them today. -He is clinically doing well except some gas and frequent BM. Labs reviewed, CBC unremarkable, CMP pending.    2. Kidney lesions -b/l and stable on CT since 05/2019. -2.4 cm right upper pole lesion unchanged in size but suspicious for renal neoplasm. -I advised him to f/u with urology; he notes he has an appointment next month.   3. Mild cognitive impairment, Social support  -followed by [employer] [facility] -He is a reliable historian, independent with ADLs, a/o x4, no signs of obvious impairment -Will  monitor for neurotoxicities on chemo, especially oxaliplatin  -He has very good family support from his 2 sisters. He lives with his mother, who has staff that cooks food for them. -prior seizures in 09/2019 and 12/2019    PLAN: -f/u with urology to discuss renal mass and prostate issue as scheduled, will send my note and his CT report to [facility]  -lab and f/u in 4 months   No problem-specific Assessment & Plan notes found for this encounter.   SUMMARY OF ONCOLOGIC HISTORY: Oncology History Overview Note  Cancer Staging Rectal adenocarcinoma [facility]) Staging form: Colon and Rectum, AJCC 8th Edition - Clinical stage from 06/12/2019: cT4, cN1 - Unsigned Stage prefix: Initial diagnosis - Pathologic stage from 02/08/2020: Stage I (ypT1, pN0, cM0) - Signed by Truitt Merle, MD on 04/06/2020 Stage prefix: Post-therapy Total positive nodes: 0 Residual tumor (R): R0 - None    Rectal adenocarcinoma ([place])  05/29/2019 Procedure   Colonoscopy per Dr. Therisa Doyne A digital rectal exam was normal  frond-like fungating infiltrative non-obstructing large mass in the rectum. The mass was partially circumferential, measured 5 cm in length. The colonoscopy also showed 11 sessile polyps in the transverse and asecnding colon and hepatic flexure.    05/29/2019 Initial Biopsy   Pathology on the rectal biopsy showed at least intramucosal adenocarcinoma involving tubulovillous adenoma with high grade dysplasia.   06/05/2019 Imaging   CT CAP IMPRESSION: 1. Cholelithiasis and gallbladder wall thickening with equivocal pericholecystic inflammation. Acute cholecystitis not excluded. If there is clinical suspicion for acute cholecystitis, recommend ultrasound evaluation. 2. Approximately 5 cm irregular mass within the distal sigmoid colon with  possible extension through the wall. No definite abnormal adjacent lymph nodes. 3. Mild omental haziness-nonspecific. Metastatic disease not excluded. 4. Indeterminate 3-4  mm RIGHT pulmonary nodules which could represent metastatic disease. These are too small for PET CT evaluation. Consider CT follow-up. 5. 2.5 cm irregular cystic mass within the RIGHT kidney. Elective MRI recommended for further evaluation. 6. 4 mm nonobstructing LEFT LOWER pole renal calculus. 7. Coronary artery disease. 8. Aortic Atherosclerosis (ICD10-I70.0).   06/12/2019 Imaging   Staging MRI pelvis  FINDINGS: TUMOR LOCATION Tumor distance from Anal Verge/Skin Surface:  11.4 cm Tumor distance to Internal Anal Sphincter: 6.5 cm TUMOR DESCRIPTION Circumferential Extent: 100% Tumor Length: 8.3 cm T - CATEGORY Extension through Muscularis Propria: Yes>49mm=T3d Shortest Distance of any tumor/node from Mesorectal Fascia: 0 mm, along the left lateral and posterior walls (tumor abuts the left lateral pelvic sidewall and sacrum) Extramural Vascular Invasion/Tumor Thrombus: No Invasion of Anterior Peritoneal Reflection: No Involvement of Adjacent Organs or Pelvic Sidewall: Yes, involving the left lateral pelvic sidewall =T4 Levator Ani Involvement: No N - CATEGORY Mesorectal Lymph Nodes >=29mm: 2=N1 Extra-mesorectal Lymphadenopathy: No Other:  None. IMPRESSION: Rectal adenocarcinoma T stage: T4 Rectal adenocarcinoma N stage:  N1 Distance from tumor to the internal anal sphincter is 6.5 cm.   06/18/2019 Initial Diagnosis   Rectal adenocarcinoma ([place])   07/02/2019 - 10/08/2019 Chemotherapy   Total Neoadjuvant FOLFOX q2weeks starting 07/02/19-10/08/19,  8 cycles   07/13/2019 PET scan   IMPRESSION: 1. Known rectal mass is intensely hypermetabolic compatible with primary rectal adenocarcinoma. No findings of FDG avid nodal metastasis or distant metastatic disease. 2. Hyperdense kidney cysts are identified bilaterally favored to represent hemorrhagic cyst. 3. Nonobstructing left renal calculi. 4. Aortic atherosclerosis, in addition to lad coronary artery disease. Please note that although  the presence of coronary artery calcium documents the presence of coronary artery disease, the severity of this disease and any potential stenosis cannot be assessed on this non-gated CT examination. Assessment for potential risk factor modification, dietary therapy or pharmacologic therapy may be warranted, if clinically indicated. Aortic Atherosclerosis (ICD10-I70.0). 5. Gallstones.   09/11/2019 - 09/14/2019 Hospital Admission   Acute AMS, seizure 09/11/19 -Developed acute altered mental status day after Cycle 6 chemo (09/11/19).  -He was admitted for work up, EEG showed epileptic spike. ID work up negative, MRI negative for metastasis.    09/11/2019 Imaging   MRI Brain  IMPRESSION: Incomplete study. Images obtained reveal no acute abnormality. Negative for acute infarct.   Atrophy and mild ventricular enlargement stable from prior studies.   09/11/2019 Imaging   CT head  IMPRESSION: Mild ventricular prominence with normal appearing sulci. Question early communicating hydrocephalus. Brain parenchyma appears unremarkable. No acute infarct. No mass or hemorrhage.   Foci of arterial vascular calcification noted. There is mucosal thickening in several ethmoid air cells.   There is probable cerumen in the right external auditory canal.     09/23/2019 Imaging   CT AP  IMPRESSION: 1. Solid-appearing left eccentric upper rectal mass measures about 6.7 by 2.9 cm. This is difficult to compare directly to the prior exam given differences in orientation and degree of distension of the rectum. There is some prominence of stool in the sigmoid colon and rectum on today's examination, but the mass is not obstructive. 2. Other imaging findings of potential clinical significance: Cholelithiasis. Complex bilateral renal lesions are similar to prior exams. Nonobstructive left nephrolithiasis. Mild prostatomegaly with nodular prominence the upper margin of the prostate gland. Lumbar spondylosis and  degenerative disc disease causing multilevel foraminal impingement. 3. Aortic atherosclerosis.   Aortic Atherosclerosis (ICD10-I70.0).   11/02/2019 - 12/09/2019 Chemotherapy   Concurrent chemo RT with Oral Xeloda 1500mg  BID on days of RT starting 11/02/19-12/09/19   11/02/2019 - 12/09/2019 Radiation Therapy   Concurrent chemo RT by Dr Lisbeth Renshaw with Xeloda  starting 11/02/19-12/09/19   01/13/2020 Imaging   MRI at [facility] 1.  Reduced size of the previously demonstrated high rectal mass. The residual tumor measures 2.4 cm in maximal diameter and is primarily endoluminal. There is a similar amount of extramural disease spread into the left mesorectal space and presacral space.  2.  Improved appearance of lymphadenopathy with several conspicuous mesorectal and internal iliac lymph nodes.  3.  Small volume of nonspecific free fluid in the pelvis.   01/29/2020 Imaging   CT CAP  IMPRESSION: 1. Interval response to therapy of rectosigmoid junction primary. 2. No findings of metastatic disease within the chest, abdomen, or pelvis. 3. Small pulmonary nodules, similar and decreased as detailed above. Nonspecific. 4. Right-sided Port-A-Cath tip at high IVC. consider repositioning. 5. Cholelithiasis. 6. Left nephrolithiasis. 7. Bilateral renal lesions. A medial upper pole renal lesion is suspicious for renal cell carcinoma. This could be re-evaluated at follow-up or more entirely characterized with routine outpatient pre and post contrast abdominal MRI.   02/08/2020 Surgery   LOWER ANTERIOR RESECTION RECTUM LAPAROSCOPIC by Dr Morton Stall at [facility]   Procedures   PR LAP,SURG,COLECTOMY, PARTIAL, W/ANAST   PR LAP, SURG MOBIL SPLENIC FL DUR PTL COLECTOMY   PR LAP, SURG ILEO/JEJUNO-STOMY   SIGMOID COLECTOMY LAPAROSCOPIC ASSISTED     02/08/2020 Pathology Results   Final Pathologic Diagnosis at [facility], "IMA", EXCISION:              Two lymph nodes are negative for metastatic carcinoma  (0/2).   B.  SIGMOID COLON AND RECTUM, LOWER ANTERIOR RESECTION: Residual well differentiated invasive adenocarcinoma of the rectum, arising from a tubulovillous adenoma with high grade dyplasia.               Size: 2 mm in greatest dimension.  Tumor invades the submucosa.  Negative for perineural and lymphovascular invasion. Resection margins are negative for malignancy or dysplasia.  Twenty-two lymph nodes are negative for metastatic carcinoma (0/22). Complete mesorectum.               See CAP cancer protocol summary and comment.   C.  PROXIMAL DOUGHNUT, RESECTION:                      Segment of benign colonic tissue.              Negative for malignancy or dyplasia.    D.  DISTAL DOUGHNUT, RESECTION:                            Segment of benign colonic tissue.              Negative for malignancy or dyplasia.    PATHOLOGIC STAGE CLASSIFICATION (pTNM, AJCC 8th Edition)         TNM Descriptors:    y (post-treatment)     Primary Tumor (pT):    pT1     Regional Lymph Nodes (pN):    pN0      02/08/2020 Cancer Staging   Staging form: Colon and Rectum, AJCC 8th Edition -  Pathologic stage from 02/08/2020: Stage I (ypT1, pN0, cM0) - Signed by Truitt Merle, MD on 04/06/2020 Stage prefix: Post-therapy Total positive nodes: 0 Residual tumor (R): R0 - None    07/05/2020 Imaging   CT C/A/P w/o contrast  IMPRESSION: 1. Mildly indistinct appearance of the fat and gallbladder wall separation in the upper abdomen, of uncertain significance and in the setting of abundant gallstones in the gallbladder lumen. Correlate with any clinical signs of abdominal pain and consider further evaluation with ultrasound as warranted. 2. RIGHT-sided Port-A-Cath tip at the high IVC near the diaphragmatic hiatus. Could consider repositioning as warranted. 3. 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 Consider follow-up evaluation, renal protocol study with with MRI with and without  contrast as outlined in previous imaging. Hemorrhagic and/or proteinaceous cysts elsewhere in the kidneys. 4. LEFT nephrolithiasis as before with lower pole calculus approximately 6 mm. 5. No signs of metastatic disease with abundant stool in the colon following partial colonic resection. Correlate with any signs of constipation. 6. 4 mm nodule in the superior segment of the RIGHT lower lobe is unchanged. Attention on follow-up 7. Small midline periumbilical hernia containing fat. 8. Aortic atherosclerosis.      INTERVAL HISTORY:  NELLO CORRO is here for a follow up of rectal cancer. He was last seen by me on 10/07/20. He presents to the clinic accompanied by his sister. He reports he continues to have bloating and gas.  He notes he is back to work.   All other systems were reviewed with the patient and are negative.  MEDICAL HISTORY:  Past Medical History:  Diagnosis Date   Anxiety    Benign prostatic hyperplasia 03/30/2013   10/1 IMO update   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/23/2019   rectal ca dx'd 06/2016   Seizures ([place])     SURGICAL HISTORY: Past Surgical History:  Procedure Laterality Date   COLON SURGERY     HIP SURGERY     INTRAMEDULLARY (IM) NAIL INTERTROCHANTERIC Left 12/11/2019   Procedure: INTRAMEDULLARY (IM) NAIL INTERTROCHANTRIC;  Surgeon: Rod Can, MD;  Location: [place];  Service: Orthopedics;  Laterality: Left;   IR REMOVAL TUN ACCESS W/ PORT W/O FL MOD SED  07/29/2020   PORTACATH PLACEMENT N/A 07/01/2019   Procedure: PORT ULTRASOUND GUIDED PLACEMENT;  Surgeon: Alphonsa Overall, MD;  Location: [facility];  Service: General;  Laterality: N/A;   PROSTATE SURGERY  2004   RECTAL SURGERY     TONSILECTOMY, ADENOIDECTOMY, BILATERAL MYRINGOTOMY AND TUBES      I have reviewed the social history and family history with the patient and they are unchanged from previous note.  ALLERGIES:  has No Known Allergies.  MEDICATIONS:  Current  Outpatient Medications  Medication Sig Dispense Refill   donepezil (ARICEPT) 10 MG tablet Take half tablet (5 mg) daily for 2 weeks, then increase to the full tablet at 10 mg daily 30 tablet 11   levETIRAcetam (KEPPRA) 1000 MG tablet Take 1 tablet twice a day (take with Levetiracetam 250mg  tablet for total of 1250mg  twice a day) 180 tablet 3   levETIRAcetam (KEPPRA) 250 MG tablet Take 1 tablet twice a day (take with Levetiracetam 1000mg  tablet for total of 1250mg  twice a day) 180 tablet 3   loperamide (IMODIUM) 2 MG capsule Take 1 capsule (2 mg total) by mouth every 6 (six) hours as needed for diarrhea or loose stools. 30 capsule 0   Multiple Vitamin (MULTIVITAMIN WITH MINERALS) TABS tablet Take  1 tablet by mouth daily.     NON FORMULARY Take 1 capsule by mouth daily. Mind works     psyllium (METAMUCIL) 58.6 % packet Take 1 packet by mouth daily.     No current facility-administered medications for this visit.    PHYSICAL EXAMINATION: ECOG PERFORMANCE STATUS: 1 - Symptomatic but completely ambulatory  Vitals:   02/27/21 0945  BP: 138/72  Pulse: 80  Resp: 15  Temp: 98.5 F (36.9 C)  SpO2: 97%   Wt Readings from Last 3 Encounters:  02/27/21 134 lb 12.8 oz (61.1 kg)  12/13/20 138 lb (62.6 kg)  10/25/20 134 lb (60.8 kg)     GENERAL:alert, no distress and comfortable SKIN: skin color normal, no rashes or significant lesions EYES: normal, Conjunctiva are pink and non-injected, sclera clear  NEURO: alert & oriented x 3 with fluent speech  LABORATORY DATA:  I have reviewed the data as listed CBC Latest Ref Rng & Units 02/27/2021 10/07/2020 07/05/2020  WBC 4.0 - 10.5 K/uL 6.7 4.4 12.5(H)  Hemoglobin 13.0 - 17.0 g/dL 12.8(L) 12.5(L) 12.1(L)  Hematocrit 39.0 - 52.0 % 39.2 37.9(L) 37.7(L)  Platelets 150 - 400 K/uL 195 209 175     CMP Latest Ref Rng & Units 02/27/2021 02/20/2021 10/07/2020  Glucose 70 - 99 mg/dL 117(H) - 93  BUN 8 - 23 mg/dL 11 - 16  Creatinine 0.61 - 1.24 mg/dL 0.71 0.90  0.84  Sodium 135 - 145 mmol/L 139 - 143  Potassium 3.5 - 5.1 mmol/L 3.8 - 4.1  Chloride 98 - 111 mmol/L 106 - 105  CO2 22 - 32 mmol/L 25 - 28  Calcium 8.9 - 10.3 mg/dL 8.8(L) - 9.6  Total Protein 6.5 - 8.1 g/dL 7.2 - 7.1  Total Bilirubin 0.3 - 1.2 mg/dL 0.6 - 0.5  Alkaline Phos 38 - 126 U/L 101 - 89  AST 15 - 41 U/L 22 - 17  ALT 0 - 44 U/L 16 - 15      RADIOGRAPHIC STUDIES: I have personally reviewed the radiological images as listed and agreed with the findings in the report. No results found.    No orders of the defined types were placed in this encounter.  All questions were answered. The patient knows to call the clinic with any problems, questions or concerns. No barriers to learning was detected. The total time spent in the appointment was 30 minutes.     Truitt Merle, MD 02/27/2021   I, Wilburn Mylar, am acting as scribe for Truitt Merle, MD.   I have reviewed the above documentation for accuracy and completeness, and I agree with the above.

## 2021-02-28 ENCOUNTER — Encounter: Payer: Self-pay | Admitting: Hematology

## 2021-03-03 DIAGNOSIS — R972 Elevated prostate specific antigen [PSA]: Secondary | ICD-10-CM | POA: Diagnosis not present

## 2021-03-06 ENCOUNTER — Other Ambulatory Visit: Payer: Self-pay

## 2021-03-06 NOTE — Progress Notes (Signed)
Dr. Burr Medico per office note requested for pt to follow-up with his Urologist Dr. Franchot Gallo [phone].  Dr. Burr Medico stated there is a 2.4cm right upper pole lesion unchanged in size but suspicious for renal neoplasm.  Pt is scheduled to f/u with Dr. Diona Fanti on 03/10/2021.  Faxed over last office note to Dr. Diona Fanti.

## 2021-03-10 DIAGNOSIS — R972 Elevated prostate specific antigen [PSA]: Secondary | ICD-10-CM | POA: Diagnosis not present

## 2021-03-10 DIAGNOSIS — D49511 Neoplasm of unspecified behavior of right kidney: Secondary | ICD-10-CM | POA: Diagnosis not present

## 2021-03-20 ENCOUNTER — Other Ambulatory Visit: Payer: Self-pay

## 2021-03-20 ENCOUNTER — Encounter: Payer: Self-pay | Admitting: Hematology

## 2021-03-20 ENCOUNTER — Ambulatory Visit (INDEPENDENT_AMBULATORY_CARE_PROVIDER_SITE_OTHER): Payer: Medicare Other | Admitting: Physician Assistant

## 2021-03-20 ENCOUNTER — Encounter: Payer: Self-pay | Admitting: Physician Assistant

## 2021-03-20 VITALS — BP 132/75 | HR 99 | Resp 20 | Ht 64.0 in | Wt 135.0 lb

## 2021-03-20 DIAGNOSIS — G40009 Localization-related (focal) (partial) idiopathic epilepsy and epileptic syndromes with seizures of localized onset, not intractable, without status epilepticus: Secondary | ICD-10-CM | POA: Diagnosis not present

## 2021-03-20 DIAGNOSIS — F0390 Unspecified dementia without behavioral disturbance: Secondary | ICD-10-CM | POA: Diagnosis not present

## 2021-03-20 MED ORDER — LEVETIRACETAM 1000 MG PO TABS: ORAL_TABLET | ORAL | 3 refills | Status: DC

## 2021-03-20 MED ORDER — LEVETIRACETAM 250 MG PO TABS: ORAL_TABLET | ORAL | 3 refills | Status: DC

## 2021-03-20 MED ORDER — DONEPEZIL HCL 10 MG PO TABS: ORAL_TABLET | ORAL | 11 refills | Status: DC

## 2021-03-20 NOTE — Progress Notes (Signed)
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 Last EEG was remarkable for known independent L and R  Temporal spikes, but negative for seizures .  He had another seizure in November 2021, with negative MRI of the brain for intracranial abnormalities.  Last EEG was normal. Since that time, the patient has no further confusion or seizures. Continue levetiracetam 1000 mg twice daily, along with levetiracetam 250 mg bid total 1250 mg bid   Case discussed with Dr. Delice Lesch who agrees with the plan    Subjective:     Carlos [name] is a very pleasant  76 year old returns for a follow-up visit, last seen in this office on 12/13/2020 at which time his MoCA was 21/30.  Since that time he was referred to functional therapy, and is now again working at [employer], enjoying his job.  "I am very happy to be there ".  His memory is "about the same", but his mood is good. He is taking donepezil 5 mg daily, as 10 mg "was too much ".  He denies any depression, anxiety sleeps well, denying any REM behavior or sleepwalking, hallucinations or paranoia.  He may lose his keys "here and there, so I put them in on the pants and going to wear the next morning so I can find them".   He denies headaches head trauma or further seizures.  He continues to take Depakote, tolerating it well.  He denies any numbness, tingling, tremors, or unilateral weakness. 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  He has known kidney lesions  without urinary complications, being watched by CT since April 2021.  He does not cook. He ambulates without difficulty without the use of a walker or a cane. He is independent of dressing and bathing.  He continues to pay his bills, although at times he does not open them and the sister has to pay them.  He drives short distances without finding himself lost.  "I always do the same route ".  His seizures are well controlled, he denies any recurrent event. Denies aura, taste changes, dizziness, or body jerks.  No neck back pain, or falls    History on Initial Assessment 11/07/2018: This is a pleasant 76 year old right-handed man with no significant past medical history presenting for evaluation of memory loss and frequent falls. His sister is present to provide additional information. He thinks his memory is reasonable. He states family has noticed more changes, they ask him things and "I don't give good answers." He endorses short-term memory changes. He lives with his 61 year old mother who has 4 caregivers. 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 He denies getting lost driving. He denies any missed bill payments. He is not on any medications. He denies any difficulties at work, he states it is routine work. His sister started noticing changes a year ago, he would forget conversations. He would forget he had already finished a task, for instance that he had already came from the grocery store. She feels symptoms worsened in the past 5-6 months. Last month, he was visiting another  sister and forgot the last portion of the trip, he did not get to his destination and just went home. He has told family he has gotten lost coming home from work, he arrived home an hour later (he has been in the same place for 7 years). His family is also concerned about an increase in frequency of falls. He has had 3 falls in the past 6 months. He reports injuring his foot and hitting his head with a fall in April, then hurting  his back in July. He thinks it is due to clumsiness. No loss of consciousness. They are concerned about the possibility of a brain tumor, their father had a brain tumor. There is a history of memory loss in his mother and younger brother (secondary to stroke). He denies any history of significant head injuries or alcohol use.    He denies any headaches, dizziness, diplopia, dysarthria, dysphagia, neck/back pain, focal numbness/tingling/weakness, bowel/bladder dysfunction, anosmia, or tremors. He felt unwell last night and BP was 151/86, he went home early from work. He states he is on his feet 12 hours a day, and he is now required to work 6 days a week (mandatory due to Covid-19 changes), he was previously working 5 days a week. His sister is concerned he is not eating well, mostly sugars and hotdogs. He is overly tired and sleepy some days. Mood is good, he has "always been hyper, but now it more" per sister. No hallucinations or paranoia.    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 There were "impairments in retrieval/consolidation aspects of verbal memory, working memory, confrontation naming, and complex visuoconstructional abilities. Additional variability was seen across processing speed and executive functioning. Visual encoding (i.e., learning) was also a relative weakness. Findings could suggest prominent left temporal dysfunction assuming normal lateralization in this right-handed individual. However, other aspects of expressive language (i.e., verbal fluency) were within normal limits. The aforementioned deficits, coupled with additional weaknesses across aspects of executive functioning and more advanced visuoconstructional  abilities could be concerning for Alzheimer's disease. However appropriate retrieval/consolidation aspects of visual memory, coupled with strong semantic fluency scores, is inconsistent with what would typically be expected."    PREVIOUS MEDICATIONS: None  CURRENT MEDICATIONS:  Outpatient Encounter Medications as of 03/20/2021  Medication Sig   levETIRAcetam (KEPPRA) 1000 MG tablet Take 1 tablet twice a day (take with Levetiracetam 250mg  tablet for total of 1250mg  twice a day)   levETIRAcetam (KEPPRA) 250 MG tablet Take 1 tablet twice a day (take with Levetiracetam 1000mg  tablet for total of 1250mg  twice a day)   loperamide (IMODIUM) 2 MG capsule Take 1 capsule (2 mg total) by mouth every 6 (six) hours as needed for diarrhea or loose stools.   Multiple Vitamin (MULTIVITAMIN WITH MINERALS) TABS tablet Take 1 tablet by mouth daily.   NON FORMULARY Take 1 capsule by mouth daily. Mind works   [DISCONTINUED] donepezil (ARICEPT) 10 MG tablet Take half tablet (5 mg) daily for 2 weeks, then increase to the full tablet at 10 mg daily   donepezil (ARICEPT) 10 MG tablet Take half tablet 5 mg daily   psyllium (METAMUCIL) 58.6 % packet Take 1 packet by mouth daily.   No facility-administered encounter medications on file as of 03/20/2021.  Objective:     PHYSICAL EXAMINATION:    VITALS:   Vitals:   03/20/21 1120  BP: 132/75  Pulse: 99  Resp: 20  SpO2: 99%  Weight: 135 lb (61.2 kg)  Height: 5\' 4"  (1.626 m)       GEN:  The patient appears stated age and is in NAD, anxious appearing HEENT:  Normocephalic, atraumatic.   Neurological examination:  Orientation: The patient is alert. Oriented to person, place and date  Cranial nerves: There is good facial symmetry.The speech is fluent and clear. No aphasia or dysarthria. Fund of knowledge is appropriate. Recent and remote memory are slightly impaired. Attention and concentration are mildly reduced. Able to name objects and repeat phrases.   Hearing is intact to conversational tone.  Sensation: Sensation is intact to light touch throughout Motor: Strength is at least antigravity x4.  Montreal Cognitive Assessment  10/25/2020 11/07/2018  Visuospatial/ Executive (0/5) 2 3  Naming (0/3) 3 3  Attention: Read list of digits (0/2) 1 2  Attention: Read list of letters (0/1) 1 1  Attention: Serial 7 subtraction starting at 100 (0/3) 3 3  Language: Repeat phrase (0/2) 2 2  Language : Fluency (0/1) 1 0  Abstraction (0/2) 2 2  Delayed Recall (0/5) 0 0  Orientation (0/6) 6 6  Total 21 22  Adjusted Score (based on education) 21 22    MMSE - Mini Mental State Exam 06/06/2020  Orientation to time 5  Orientation to Place 5  Registration 3  Attention/ Calculation 3  Recall 2  Language- name 2 objects 2  Language- repeat 1  Language- follow 3 step command 3  Language- read & follow direction 1  Write a sentence 1  Copy design 0  Total score 26     Movement examination: Tone: There is normal tone in the UE/LE Abnormal movements:  no tremor.  No myoclonus.  No asterixis.   Coordination:  There is no decremation with RAM's.  Gait and Station: The patient has no difficulty arising out of a deep-seated chair without the use of the hands. The patient's stride length is good.  Gait is cautious and narrow.    Total time spent on today's visit was 30 minutes, including both face-to-face time and nonface-to-face time.  Time included that spent on review of records (prior notes available to me/labs/imaging if pertinent), discussing treatment and goals, answering patient's questions and coordinating care.  Cc:  [name], MD Sharene Butters, PA-C

## 2021-03-20 NOTE — Patient Instructions (Addendum)
It was a pleasure to see you today at our office.   Recommendations:  Meds: Follow up in 6  months Continue donepezil  5 mg daily.   Continue physical activity  Continue senior activities   Continue Keppra     RECOMMENDATIONS FOR ALL PATIENTS WITH MEMORY PROBLEMS: 1. Continue to exercise (Recommend 30 minutes of walking everyday, or 3 hours every week) 2. Increase social interactions - continue going to Church and enjoy social gatherings with friends and family 3. Eat healthy, avoid fried foods and eat more fruits and vegetables 4. Maintain adequate blood pressure, blood sugar, and blood cholesterol level. Reducing the risk of stroke and cardiovascular disease also helps promoting better memory. 5. Avoid stressful situations. Live a simple life and avoid aggravations. Organize your time and prepare for the next day in anticipation. 6. Sleep well, avoid any interruptions of sleep and avoid any distractions in the bedroom that may interfere with adequate sleep quality 7. Avoid sugar, avoid sweets as there is a strong link between excessive sugar intake, diabetes, and cognitive impairment We discussed the Mediterranean diet, which has been shown to help patients reduce the risk of progressive memory disorders and reduces cardiovascular risk. This includes eating fish, eat fruits and green leafy vegetables, nuts like almonds and hazelnuts, walnuts, and also use olive oil. Avoid fast foods and fried foods as much as possible. Avoid sweets and sugar as sugar use has been linked to worsening of memory function.  There is always a concern of gradual progression of memory problems. If this is the case, then we may need to adjust level of care according to patient needs. Support, both to the patient and caregiver, should then be put into place.    The Alzheimer's Association is here all day, every day for people facing Alzheimer's disease through our free 24/7 Helpline: [phone]. The Helpline  provides reliable information and support to all those who need assistance, such as individuals living with memory loss, Alzheimer's or other dementia, caregivers, health care professionals and the public.  Our highly trained and knowledgeable staff can help you with: Understanding memory loss, dementia and Alzheimer's  Medications and other treatment options  General information about aging and brain health  Skills to provide quality care and to find the best care from professionals  Legal, financial and living-arrangement decisions Our Helpline also features: Confidential care consultation provided by master's level clinicians who can help with decision-making support, crisis assistance and education on issues families face every day  Help in a caller's preferred language using our translation service that features more than 200 languages and dialects  Referrals to local community programs, services and ongoing support     FALL PRECAUTIONS: Be cautious when walking. Scan the area for obstacles that may increase the risk of trips and falls. When getting up in the mornings, sit up at the edge of the bed for a few minutes before getting out of bed. Consider elevating the bed at the head end to avoid drop of blood pressure when getting up. Walk always in a well-lit room (use night lights in the walls). Avoid area rugs or power cords from appliances in the middle of the walkways. Use a walker or a cane if necessary and consider physical therapy for balance exercise. Get your eyesight checked regularly.  FINANCIAL OVERSIGHT: Supervision, especially oversight when making financial decisions or transactions is also recommended.  HOME SAFETY: Consider the safety of the kitchen when operating appliances like stoves, microwave oven, and   blender. Consider having supervision and share cooking responsibilities until no longer able to participate in those. Accidents with firearms and other hazards in the house  should be identified and addressed as well.   ABILITY TO BE LEFT ALONE: If patient is unable to contact 911 operator, consider using LifeLine, or when the need is there, arrange for someone to stay with patients. Smoking is a fire hazard, consider supervision or cessation. Risk of wandering should be assessed by caregiver and if detected at any point, supervision and safe proof recommendations should be instituted.  MEDICATION SUPERVISION: Inability to self-administer medication needs to be constantly addressed. Implement a mechanism to ensure safe administration of the medications.   DRIVING: Regarding driving, in patients with progressive memory problems, driving will be impaired. We advise to have someone else do the driving if trouble finding directions or if minor accidents are reported. Independent driving assessment is available to determine safety of driving.   If you are interested in the driving assessment, you can contact the following:  [employer] in [place] [phone]  [facility] [phone]  [facility] [phone]  [facility] [phone] or [phone]      Mediterranean Diet A Mediterranean diet refers to food and lifestyle choices that are based on the traditions of countries located on the [place] Sea. This way of eating has been shown to help prevent certain conditions and improve outcomes for people who have chronic diseases, like kidney disease and heart disease. What are tips for following this plan? Lifestyle  Cook and eat meals together with your family, when possible. Drink enough fluid to keep your urine clear or pale yellow. Be physically active every day. This includes: Aerobic exercise like running or swimming. Leisure activities like gardening, walking, or housework. Get 7-8 hours of sleep each night. If recommended by your health care provider, drink red wine in moderation. This means 1  glass a day for nonpregnant women and 2 glasses a day for men. A glass of wine equals 5 oz (150 mL). Reading food labels  Check the serving size of packaged foods. For foods such as rice and pasta, the serving size refers to the amount of cooked product, not dry. Check the total fat in packaged foods. Avoid foods that have saturated fat or trans fats. Check the ingredients list for added sugars, such as corn syrup. Shopping  At the grocery store, buy most of your food from the areas near the walls of the store. This includes: Fresh fruits and vegetables (produce). Grains, beans, nuts, and seeds. Some of these may be available in unpackaged forms or large amounts (in bulk). Fresh seafood. Poultry and eggs. Low-fat dairy products. Buy whole ingredients instead of prepackaged foods. Buy fresh fruits and vegetables in-season from local farmers markets. Buy frozen fruits and vegetables in resealable bags. If you do not have access to quality fresh seafood, buy precooked frozen shrimp or canned fish, such as tuna, salmon, or sardines. Buy small amounts of raw or cooked vegetables, salads, or olives from the deli or salad bar at your store. Stock your pantry so you always have certain foods on hand, such as olive oil, canned tuna, canned tomatoes, rice, pasta, and beans. Cooking  Cook foods with extra-virgin olive oil instead of using butter or other vegetable oils. Have meat as a side dish, and have vegetables or grains as your main dish. This means having meat in small portions or adding small amounts of meat to foods like   pasta or stew. Use beans or vegetables instead of meat in common dishes like chili or lasagna. Experiment with different cooking methods. Try roasting or broiling vegetables instead of steaming or sauteing them. Add frozen vegetables to soups, stews, pasta, or rice. Add nuts or seeds for added healthy fat at each meal. You can add these to yogurt, salads, or vegetable  dishes. Marinate fish or vegetables using olive oil, lemon juice, garlic, and fresh herbs. Meal planning  Plan to eat 1 vegetarian meal one day each week. Try to work up to 2 vegetarian meals, if possible. Eat seafood 2 or more times a week. Have healthy snacks readily available, such as: Vegetable sticks with hummus. Greek yogurt. Fruit and nut trail mix. Eat balanced meals throughout the week. This includes: Fruit: 2-3 servings a day Vegetables: 4-5 servings a day Low-fat dairy: 2 servings a day Fish, poultry, or lean meat: 1 serving a day Beans and legumes: 2 or more servings a week Nuts and seeds: 1-2 servings a day Whole grains: 6-8 servings a day Extra-virgin olive oil: 3-4 servings a day Limit red meat and sweets to only a few servings a month What are my food choices? Mediterranean diet Recommended Grains: Whole-grain pasta. Brown rice. Bulgar wheat. Polenta. Couscous. Whole-wheat bread. Oatmeal. Quinoa. Vegetables: Artichokes. Beets. Broccoli. Cabbage. Carrots. Eggplant. Green beans. Chard. Kale. Spinach. Onions. Leeks. Peas. Squash. Tomatoes. Peppers. Radishes. Fruits: Apples. Apricots. Avocado. Berries. Bananas. Cherries. Dates. Figs. Grapes. Lemons. Melon. Oranges. Peaches. Plums. Pomegranate. Meats and other protein foods: Beans. Almonds. Sunflower seeds. Pine nuts. Peanuts. Cod. Salmon. Scallops. Shrimp. Tuna. Tilapia. Clams. Oysters. Eggs. Dairy: Low-fat milk. Cheese. Greek yogurt. Beverages: Water. Red wine. Herbal tea. Fats and oils: Extra virgin olive oil. Avocado oil. Grape seed oil. Sweets and desserts: Greek yogurt with honey. Baked apples. Poached pears. Trail mix. Seasoning and other foods: Basil. Cilantro. Coriander. Cumin. Mint. Parsley. Sage. Rosemary. Tarragon. Garlic. Oregano. Thyme. Pepper. Balsalmic vinegar. Tahini. Hummus. Tomato sauce. Olives. Mushrooms. Limit these Grains: Prepackaged pasta or rice dishes. Prepackaged cereal with added  sugar. Vegetables: Deep fried potatoes (french fries). Fruits: Fruit canned in syrup. Meats and other protein foods: Beef. Pork. Lamb. Poultry with skin. Hot dogs. Bacon. Dairy: Ice cream. Sour cream. Whole milk. Beverages: Juice. Sugar-sweetened soft drinks. Beer. Liquor and spirits. Fats and oils: Butter. Canola oil. Vegetable oil. Beef fat (tallow). Lard. Sweets and desserts: Cookies. Cakes. Pies. Candy. Seasoning and other foods: Mayonnaise. Premade sauces and marinades. The items listed may not be a complete list. Talk with your dietitian about what dietary choices are right for you. Summary The Mediterranean diet includes both food and lifestyle choices. Eat a variety of fresh fruits and vegetables, beans, nuts, seeds, and whole grains. Limit the amount of red meat and sweets that you eat. Talk with your health care provider about whether it is safe for you to drink red wine in moderation. This means 1 glass a day for nonpregnant women and 2 glasses a day for men. A glass of wine equals 5 oz (150 mL). This information is not intended to replace advice given to you by your health care provider. Make sure you discuss any questions you have with your health care provider. Document Released: 09/15/2015 Document Revised: 10/18/2015 Document Reviewed: 09/15/2015 Elsevier Interactive Patient Education  2017 [employer]     

## 2021-03-23 DIAGNOSIS — R140 Abdominal distension (gaseous): Secondary | ICD-10-CM | POA: Diagnosis not present

## 2021-04-18 ENCOUNTER — Other Ambulatory Visit: Payer: Self-pay | Admitting: Physician Assistant

## 2021-04-18 ENCOUNTER — Other Ambulatory Visit: Payer: Self-pay | Admitting: Neurology

## 2021-04-18 DIAGNOSIS — Z85048 Personal history of other malignant neoplasm of rectum, rectosigmoid junction, and anus: Secondary | ICD-10-CM | POA: Diagnosis not present

## 2021-04-18 DIAGNOSIS — R143 Flatulence: Secondary | ICD-10-CM | POA: Diagnosis not present

## 2021-04-18 NOTE — Telephone Encounter (Signed)
Please look behind me but I think that is a 90 day supply 300 for 30 ?

## 2021-05-25 DIAGNOSIS — Z20822 Contact with and (suspected) exposure to covid-19: Secondary | ICD-10-CM | POA: Diagnosis not present

## 2021-06-09 ENCOUNTER — Telehealth: Payer: Self-pay | Admitting: Physician Assistant

## 2021-06-09 ENCOUNTER — Ambulatory Visit (HOSPITAL_COMMUNITY)
Admission: EM | Admit: 2021-06-09 | Discharge: 2021-06-09 | Disposition: A | Discharge status: Home / Self Care | Payer: Medicare Other

## 2021-06-09 ENCOUNTER — Encounter (HOSPITAL_COMMUNITY): Payer: Self-pay

## 2021-06-09 DIAGNOSIS — R194 Change in bowel habit: Secondary | ICD-10-CM | POA: Diagnosis not present

## 2021-06-09 NOTE — Telephone Encounter (Signed)
Fax to come over at 1:17pm 06/09/2021 ?

## 2021-06-09 NOTE — Discharge Instructions (Addendum)
-   Please follow up with Dr. Therisa Doyne (contact information is below) and Oncologist as scheduled ?- You can try taking a daily oral probiotic (found in different yogurts) in the meantime ? ?

## 2021-06-09 NOTE — ED Provider Notes (Signed)
?[facility] ? ? ? ?CSN: [id number] ?Arrival date & time: 06/09/21  1645 ? ? ?  ? ?History   ?Chief Complaint ?Chief Complaint  ?Patient presents with  ? frequent stools  ? ? ?HPI ?Carlos [name] is a 76 y.o. male.  ? ?Patient presents with 1 week of increased bowel movements.  He reports the bowel movements are soft, they are not watery.  He reports they can happen 5-6 times per day.  He denies any fevers, nausea/vomiting, change in appetite, blood in his stool, unexplained weight loss.  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  He reports he works at the [employer] and this is affecting his ability to work, as he needs to leave the station multiple times a day and it is getting him in trouble. ? ? ?Past Medical History:  ?Diagnosis Date  ? Anxiety   ? Benign prostatic hyperplasia 03/30/2013  ? 10/1 IMO update  ? 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 rectal ca dx'd 06/2016  ? Seizures ([place])   ? ? ?Patient Active Problem List  ? Diagnosis Date Noted  ? Localization-related idiopathic epilepsy and epileptic syndromes with seizures of localized onset, not intractable, without status epilepticus ([place]) 10/26/2020  ? Abnormality of gait 02/01/2020  ? Abnormal urinalysis   ? Post-operative pain   ? Left hip pain   ? Closed nondisplaced intertrochanteric fracture of left femur ([place])   ? Loose stools   ? Hypoalbuminemia due to protein-calorie malnutrition ([place])   ? Hypokalemia   ? Acute blood loss anemia   ? Seizures ([place])   ? Seizure ([place]) 12/15/2019  ? Pressure ulcer, stage 2 ([place]) 12/12/2019  ? Closed comminuted intertrochanteric fracture of left femur ([place])   ? Breakthrough seizure ([place]) 12/08/2019  ? Acute encephalopathy 09/11/2019  ? Fever 09/11/2019  ? Port-A-Cath in place 09/11/2019  ? Rectal adenocarcinoma ([place]) 06/18/2019  ? 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 Benign prostatic hyperplasia  03/30/2013  ? ? ?Past Surgical History:  ?Procedure Laterality Date  ? COLON SURGERY    ? HIP SURGERY    ? INTRAMEDULLARY (IM) NAIL INTERTROCHANTERIC Left 12/11/2019  ? Procedure: INTRAMEDULLARY (IM) NAIL INTERTROCHANTRIC;  Surgeon: Rod Can, MD;  Location: [facility];  Service: Orthopedics;  Laterality: Left;  ? IR REMOVAL TUN ACCESS W/ PORT W/O FL MOD SED  07/29/2020  ? PORTACATH PLACEMENT N/A 07/01/2019  ? Procedure: PORT ULTRASOUND GUIDED PLACEMENT;  Surgeon: Alphonsa Overall, MD;  Location: [facility];  Service: General;  Laterality: N/A;  ? PROSTATE SURGERY  2004  ? RECTAL SURGERY    ? TONSILECTOMY, ADENOIDECTOMY, BILATERAL MYRINGOTOMY AND TUBES    ? ? ? ? ? ?Home Medications   ? ?Prior to Admission medications   ?Medication Sig Start Date End Date Taking? Authorizing Provider  ?donepezil (ARICEPT) 10 MG tablet Take half tablet 5 mg daily 03/20/21   Rondel Jumbo, PA-C  ?levETIRAcetam (KEPPRA) 1000 MG tablet Take 1 tablet twice a day (take with Levetiracetam '250mg'$  tablet for total of '1250mg'$  twice a day) 03/20/21   Shawn Route, Coralee Pesa, PA-C  ?levETIRAcetam (KEPPRA) 250 MG tablet TAKE 5 TABLETS BY MOUTH TWICE DAILY 04/19/21   Rondel Jumbo, PA-C  ?loperamide (IMODIUM) 2 MG capsule Take 1 capsule (2 mg total) by mouth every 6 (six) hours as needed for diarrhea or loose stools. 12/15/19   Terrilee Croak, MD  ?Multiple Vitamin (MULTIVITAMIN WITH  MINERALS) TABS tablet Take 1 tablet by mouth daily.    [provider]  ?NON FORMULARY Take 1 capsule by mouth daily. Mind works    Secondary school teacher, Historical, MD  ?psyllium (METAMUCIL) 58.6 % packet Take 1 packet by mouth daily.    [provider]  ? ? ?Family History ?Family History  ?Problem Relation Age of Onset  ? Osteoporosis Mother   ? Dementia Mother   ? Diabetes Mother   ? Brain cancer Father   ? ? ?Social History ?Social History  ? ?Tobacco Use  ? Smoking status: Never  ? Smokeless tobacco: Never  ?Vaping Use  ? Vaping Use: Never used  ?Substance Use Topics  ?  Alcohol use: No  ? Drug use: No  ? ? ? ?Allergies   ?Patient has no known allergies. ? ? ?Review of Systems ?Review of Systems ?Per HPI ? ?Physical Exam ?Triage Vital Signs ?ED Triage Vitals [06/09/21 1725]  ?Enc Vitals Group  ?   BP (!) 145/77  ?   Pulse Rate 68  ?   Resp 18  ?   Temp 98.2 ?F (36.8 ?C)  ?   Temp Source Oral  ?   SpO2 97 %  ?   Weight   ?   Height   ?   Head Circumference   ?   Peak Flow   ?   Pain Score 0  ?   Pain Loc   ?   Pain Edu?   ?   Excl. in [place]?   ? ?No data found. ? ?Updated Vital Signs ?BP (!) 145/77 (BP Location: Right Arm)   Pulse 68   Temp 98.2 ?F (36.8 ?C) (Oral)   Resp 18   SpO2 97%  ? ?Visual Acuity ?Right Eye Distance:   ?Left Eye Distance:   ?Bilateral Distance:   ? ?Right Eye Near:   ?Left Eye Near:    ?Bilateral Near:    ? ?Physical Exam ?Vitals and nursing note reviewed.  ?Constitutional:   ?   General: He is not in acute distress. ?   Appearance: Normal appearance. He is not toxic-appearing.  ?HENT:  ?   Mouth/Throat:  ?   Mouth: Mucous membranes are moist.  ?   Pharynx: Oropharynx is clear.  ?Cardiovascular:  ?   Rate and Rhythm: Normal rate and regular rhythm.  ?Pulmonary:  ?   Effort: Pulmonary effort is normal. No respiratory distress.  ?Abdominal:  ?   General: Abdomen is flat. Bowel sounds are normal. There is no distension.  ?   Palpations: Abdomen is soft.  ?   Tenderness: There is no abdominal tenderness. There is no right CVA tenderness, left CVA tenderness, guarding or rebound.  ?Skin: ?   General: Skin is warm and dry.  ?   Capillary Refill: Capillary refill takes less than 2 seconds.  ?   Coloration: Skin is not jaundiced or pale.  ?   Findings: No erythema.  ?Neurological:  ?   Mental Status: He is alert and oriented to person, place, and time.  ?   Motor: No weakness.  ?   Gait: Gait normal.  ?Psychiatric:     ?   Behavior: Behavior is cooperative.  ? ? ? ?UC Treatments / Results  ?Labs ?(all labs ordered are listed, but only abnormal results are  displayed) ?Labs Reviewed - No data to display ? ?EKG ? ? ?Radiology ?No results found. ? ?Procedures ?Procedures (including critical care time) ? ?Medications  Ordered in UC ?Medications - No data to display ? ?Initial Impression / Assessment and Plan / UC Course  ?I have reviewed the triage vital signs and the nursing notes. ? ?Pertinent labs & imaging results that were available during my care of the patient were reviewed by me and considered in my medical decision making (see chart for details). ? ?  ?There are no red flags in history or on exam today.  I do not think patient needs go emergency room.  However, I do think he needs close follow-up with both his gastroenterologist and oncologist.  It appears he has a visit already scheduled with his oncologist later this month.  I encouraged him to contact his gastroenterologist for further evaluation of the increase in bowel movements.  It does look like this was mentioned at his last oncology visit, although it sounds like it has worsened.  He does not have any signs of dehydration and his vital signs are stable today.  He is safe for discharge. ?Final Clinical Impressions(s) / UC Diagnoses  ? ?Final diagnoses:  ?Frequent bowel movements  ? ? ? ?Discharge Instructions   ? ?  ?- Please follow up with Dr. Therisa Doyne (contact information is below) and Oncologist as scheduled ?- You can try taking a daily oral probiotic (found in different yogurts) in the meantime ? ? ? ? ?ED Prescriptions   ?None ?  ? ?PDMP not reviewed this encounter. ?  ?Eulogio Bear, NP ?06/09/21 2113 ? ?

## 2021-06-09 NOTE — Telephone Encounter (Signed)
Pt's sister called in stating the patient is supposed to have dental surgery in June, but the dental office wants to double check about the anesthesia they should use. They were supposed to have sent a form in to the office to be filled out. ?

## 2021-06-09 NOTE — ED Triage Notes (Signed)
Several weeks of increased gas and frequent loose stools. Pt is concerned for IBS. No emesis. H/o colon cancer. ?

## 2021-06-10 DIAGNOSIS — Z20822 Contact with and (suspected) exposure to covid-19: Secondary | ICD-10-CM | POA: Diagnosis not present

## 2021-06-11 DIAGNOSIS — Z20822 Contact with and (suspected) exposure to covid-19: Secondary | ICD-10-CM | POA: Diagnosis not present

## 2021-06-12 ENCOUNTER — Telehealth: Payer: Self-pay | Admitting: Physician Assistant

## 2021-06-12 NOTE — Telephone Encounter (Signed)
From the standpoint of seizures, there is no contraindication to medically necessary surgery. The patient should take all seizure medications on time, even if NPO for surgery.  

## 2021-06-12 NOTE — Telephone Encounter (Signed)
Awaiting form, will call Lovey Newcomer once I receive it  ?

## 2021-06-12 NOTE — Telephone Encounter (Signed)
Form is here, awaiting Dr.Jaffe  ?

## 2021-06-12 NOTE — Telephone Encounter (Signed)
Surgery clearance form received. I placed the form in Sara's box. ?

## 2021-06-12 NOTE — Telephone Encounter (Signed)
Sandy from [facility] called to be sure the office received a surgery clearance form. They need it faxed back. ? ?Patient's surgery is scheduled for 07/12/21.  ? ?Fax: [phone] ?

## 2021-06-12 NOTE — Telephone Encounter (Signed)
Sent paper back for Dr.Aquino, copy to scan  ?

## 2021-06-13 DIAGNOSIS — G40909 Epilepsy, unspecified, not intractable, without status epilepticus: Secondary | ICD-10-CM | POA: Diagnosis not present

## 2021-06-13 DIAGNOSIS — Z85038 Personal history of other malignant neoplasm of large intestine: Secondary | ICD-10-CM | POA: Diagnosis not present

## 2021-06-13 DIAGNOSIS — Z01818 Encounter for other preprocedural examination: Secondary | ICD-10-CM | POA: Diagnosis not present

## 2021-06-13 DIAGNOSIS — K029 Dental caries, unspecified: Secondary | ICD-10-CM | POA: Diagnosis not present

## 2021-06-28 ENCOUNTER — Other Ambulatory Visit: Payer: Self-pay

## 2021-06-28 ENCOUNTER — Inpatient Hospital Stay: Payer: Medicare Other

## 2021-06-28 ENCOUNTER — Encounter: Payer: Self-pay | Admitting: Hematology

## 2021-06-28 ENCOUNTER — Inpatient Hospital Stay: Payer: Medicare Other | Attending: Hematology | Admitting: Hematology

## 2021-06-28 VITALS — BP 140/74 | HR 76 | Temp 98.1°F | Resp 17 | Ht 64.0 in | Wt 135.0 lb

## 2021-06-28 DIAGNOSIS — G3184 Mild cognitive impairment, so stated: Secondary | ICD-10-CM | POA: Diagnosis not present

## 2021-06-28 DIAGNOSIS — C20 Malignant neoplasm of rectum: Secondary | ICD-10-CM

## 2021-06-28 DIAGNOSIS — Z9221 Personal history of antineoplastic chemotherapy: Secondary | ICD-10-CM | POA: Diagnosis not present

## 2021-06-28 DIAGNOSIS — Z79899 Other long term (current) drug therapy: Secondary | ICD-10-CM | POA: Diagnosis not present

## 2021-06-28 LAB — CMP (CANCER CENTER ONLY)
ALT: 14 U/L (ref 0–44)
AST: 20 U/L (ref 15–41)
Albumin: 4.3 g/dL (ref 3.5–5.0)
Alkaline Phosphatase: 86 U/L (ref 38–126)
Anion gap: 6 (ref 5–15)
BUN: 18 mg/dL (ref 8–23)
CO2: 30 mmol/L (ref 22–32)
Calcium: 9.4 mg/dL (ref 8.9–10.3)
Chloride: 106 mmol/L (ref 98–111)
Creatinine: 0.89 mg/dL (ref 0.61–1.24)
GFR, Estimated: >60 mL/min (ref >60)
Glucose, Bld: 127 mg/dL — ABNORMAL HIGH (ref 70–99)
Potassium: 3.8 mmol/L (ref 3.5–5.1)
Sodium: 142 mmol/L (ref 135–145)
Total Bilirubin: 0.5 mg/dL (ref 0.3–1.2)
Total Protein: 7.1 g/dL (ref 6.5–8.1)

## 2021-06-28 LAB — CBC WITH DIFFERENTIAL (CANCER CENTER ONLY)
Abs Immature Granulocytes: 0.01 10*3/uL (ref 0.00–0.07)
Basophils Absolute: 0 10*3/uL (ref 0.0–0.1)
Basophils Relative: 0 %
Eosinophils Absolute: 0 10*3/uL (ref 0.0–0.5)
Eosinophils Relative: 1 %
HCT: 39.3 % (ref 39.0–52.0)
Hemoglobin: 13.1 g/dL (ref 13.0–17.0)
Immature Granulocytes: 0 %
Lymphocytes Relative: 29 %
Lymphs Abs: 1.2 10*3/uL (ref 0.7–4.0)
MCH: 29.5 pg (ref 26.0–34.0)
MCHC: 33.3 g/dL (ref 30.0–36.0)
MCV: 88.5 fL (ref 80.0–100.0)
Monocytes Absolute: 0.3 10*3/uL (ref 0.1–1.0)
Monocytes Relative: 8 %
Neutro Abs: 2.5 10*3/uL (ref 1.7–7.7)
Neutrophils Relative %: 62 %
Platelet Count: 219 10*3/uL (ref 150–400)
RBC: 4.44 MIL/uL (ref 4.22–5.81)
RDW: 13.4 % (ref 11.5–15.5)
WBC Count: 4.1 10*3/uL (ref 4.0–10.5)
nRBC: 0 % (ref 0.0–0.2)

## 2021-06-28 NOTE — Progress Notes (Signed)
[place]   [phone] Fax:([phone]) [phone]   Clinic Follow up Note   Patient Care Team: [name], MD as PCP - General (Family Medicine) Cameron Sprang, MD as Consulting Physician (Neurology) Truitt Merle, MD as Consulting Physician (Hematology) Ronnette Juniper, MD as Consulting Physician (Gastroenterology) Elease Hashimoto (Neurology)  Date of Service:  06/28/2021  CHIEF COMPLAINT: f/u of rectal cancer  CURRENT THERAPY:  Surveillance  ASSESSMENT & PLAN:  Carlos [name] is a 76 y.o. male with   1. Adenocarcinoma of the rectum, cT4N1M0 stage III, ypT1aN0  -initially diagnosed in 05/2019 with locally advanced stage III rectal cancer and treated with total neoadjuvant chemotherapy FOLFOX 07/02/19-10/08/19 and concurrent chemoRT with Xeloda '1500mg'$  BID  11/02/19-12/07/19. He missed last 2 treatments due to hospitalization for Seizure and left femur fracture.  -He underwent low anterior resection surgery with Dr Morton Stall at [facility] on 02/08/20. Surgical pathology showed complete surgical resection with mild T1 residual disease, LN negative.  -s/p ostomy take down on 05/11/20 under Dr. Morton Stall.  -port removed on 07/29/20 due to malfunction and position  -surveillance colonoscopy with Dr. Therisa Doyne in 09/2020 was benign -CT CAP 02/20/21 showed NED.  -He is clinically doing very well. He notes his prior excessive gas and frequent bowel movements have greatly improved since changing his diet. Labs reviewed, CBC unremarkable, CMP pending.  -Exam was unremarkable.  There is no clinical concern for recurrence. -Continue colon cancer surveillance.  Plan to repeat a scan again in January 2024   2. Kidney lesions -b/l and stable on CT since 05/2019.   3. Mild cognitive impairment, Social support  -followed by [employer] [facility] -He is a reliable historian, independent with ADLs, a/o x4, no signs of obvious impairment -Will monitor for neurotoxicities on chemo, especially oxaliplatin  -He  has very good family support from his 2 sisters. He lives with his mother, who has staff that cooks food for them. -prior seizures in 09/2019 and 12/2019     PLAN: -lab and f/u in 6 months   No problem-specific Assessment & Plan notes found for this encounter.   SUMMARY OF ONCOLOGIC HISTORY: Oncology History Overview Note  Cancer Staging Rectal adenocarcinoma [facility]) Staging form: Colon and Rectum, AJCC 8th Edition - Clinical stage from 06/12/2019: cT4, cN1 - Unsigned Stage prefix: Initial diagnosis - Pathologic stage from 02/08/2020: Stage I (ypT1, pN0, cM0) - Signed by Truitt Merle, MD on 04/06/2020 Stage prefix: Post-therapy Total positive nodes: 0 Residual tumor (R): R0 - None    Rectal adenocarcinoma ([place])  05/29/2019 Procedure   Colonoscopy per Dr. Therisa Doyne A digital rectal exam was normal  frond-like fungating infiltrative non-obstructing large mass in the rectum. The mass was partially circumferential, measured 5 cm in length. The colonoscopy also showed 11 sessile polyps in the transverse and asecnding colon and hepatic flexure.    05/29/2019 Initial Biopsy   Pathology on the rectal biopsy showed at least intramucosal adenocarcinoma involving tubulovillous adenoma with high grade dysplasia.   06/05/2019 Imaging   CT CAP IMPRESSION: 1. Cholelithiasis and gallbladder wall thickening with equivocal pericholecystic inflammation. Acute cholecystitis not excluded. If there is clinical suspicion for acute cholecystitis, recommend ultrasound evaluation. 2. Approximately 5 cm irregular mass within the distal sigmoid colon with possible extension through the wall. No definite abnormal adjacent lymph nodes. 3. Mild omental haziness-nonspecific. Metastatic disease not excluded. 4. Indeterminate 3-4 mm RIGHT pulmonary nodules which could represent metastatic disease. These are too small for PET CT evaluation. Consider CT  follow-up. 5. 2.5 cm irregular cystic mass within the RIGHT kidney.  Elective MRI recommended for further evaluation. 6. 4 mm nonobstructing LEFT LOWER pole renal calculus. 7. Coronary artery disease. 8. Aortic Atherosclerosis (ICD10-I70.0).   06/12/2019 Imaging   Staging MRI pelvis  FINDINGS: TUMOR LOCATION Tumor distance from Anal Verge/Skin Surface:  11.4 cm Tumor distance to Internal Anal Sphincter: 6.5 cm TUMOR DESCRIPTION Circumferential Extent: 100% Tumor Length: 8.3 cm T - CATEGORY Extension through Muscularis Propria: Yes>43m=T3d Shortest Distance of any tumor/node from Mesorectal Fascia: 0 mm, along the left lateral and posterior walls (tumor abuts the left lateral pelvic sidewall and sacrum) Extramural Vascular Invasion/Tumor Thrombus: No Invasion of Anterior Peritoneal Reflection: No Involvement of Adjacent Organs or Pelvic Sidewall: Yes, involving the left lateral pelvic sidewall =T4 Levator Ani Involvement: No N - CATEGORY Mesorectal Lymph Nodes >=575m 2=N1 Extra-mesorectal Lymphadenopathy: No Other:  None. IMPRESSION: Rectal adenocarcinoma T stage: T4 Rectal adenocarcinoma N stage:  N1 Distance from tumor to the internal anal sphincter is 6.5 cm.   06/18/2019 Initial Diagnosis   Rectal adenocarcinoma ([facility]  07/02/2019 - 10/08/2019 Chemotherapy   Total Neoadjuvant FOLFOX q2weeks starting 07/02/19-10/08/19,  8 cycles   07/13/2019 PET scan   IMPRESSION: 1. Known rectal mass is intensely hypermetabolic compatible with primary rectal adenocarcinoma. No findings of FDG avid nodal metastasis or distant metastatic disease. 2. Hyperdense kidney cysts are identified bilaterally favored to represent hemorrhagic cyst. 3. Nonobstructing left renal calculi. 4. Aortic atherosclerosis, in addition to lad coronary artery disease. Please note that although the presence of coronary artery calcium documents the presence of coronary artery disease, the severity of this disease and any potential stenosis cannot be assessed on this non-gated CT  examination. Assessment for potential risk factor modification, dietary therapy or pharmacologic therapy may be warranted, if clinically indicated. Aortic Atherosclerosis (ICD10-I70.0). 5. Gallstones.   09/11/2019 - 09/14/2019 Hospital Admission   Acute AMS, seizure 09/11/19 -Developed acute altered mental status day after Cycle 6 chemo (09/11/19).  -He was admitted for work up, EEG showed epileptic spike. ID work up negative, MRI negative for metastasis.    09/11/2019 Imaging   MRI Brain  IMPRESSION: Incomplete study. Images obtained reveal no acute abnormality. Negative for acute infarct.   Atrophy and mild ventricular enlargement stable from prior studies.   09/11/2019 Imaging   CT head  IMPRESSION: Mild ventricular prominence with normal appearing sulci. Question early communicating hydrocephalus. Brain parenchyma appears unremarkable. No acute infarct. No mass or hemorrhage.   Foci of arterial vascular calcification noted. There is mucosal thickening in several ethmoid air cells.   There is probable cerumen in the right external auditory canal.     09/23/2019 Imaging   CT AP  IMPRESSION: 1. Solid-appearing left eccentric upper rectal mass measures about 6.7 by 2.9 cm. This is difficult to compare directly to the prior exam given differences in orientation and degree of distension of the rectum. There is some prominence of stool in the sigmoid colon and rectum on today's examination, but the mass is not obstructive. 2. Other imaging findings of potential clinical significance: Cholelithiasis. Complex bilateral renal lesions are similar to prior exams. Nonobstructive left nephrolithiasis. Mild prostatomegaly with nodular prominence the upper margin of the prostate gland. Lumbar spondylosis and degenerative disc disease causing multilevel foraminal impingement. 3. Aortic atherosclerosis.   Aortic Atherosclerosis (ICD10-I70.0).   11/02/2019 - 12/09/2019 Chemotherapy   Concurrent  chemo RT with Oral Xeloda '1500mg'$  BID on days of RT starting 11/02/19-12/09/19   11/02/2019 -  12/09/2019 Radiation Therapy   Concurrent chemo RT by Dr Lisbeth Renshaw with Xeloda  starting 11/02/19-12/09/19   01/13/2020 Imaging   MRI at [facility] 1.  Reduced size of the previously demonstrated high rectal mass. The residual tumor measures 2.4 cm in maximal diameter and is primarily endoluminal. There is a similar amount of extramural disease spread into the left mesorectal space and presacral space.  2.  Improved appearance of lymphadenopathy with several conspicuous mesorectal and internal iliac lymph nodes.  3.  Small volume of nonspecific free fluid in the pelvis.   01/29/2020 Imaging   CT CAP  IMPRESSION: 1. Interval response to therapy of rectosigmoid junction primary. 2. No findings of metastatic disease within the chest, abdomen, or pelvis. 3. Small pulmonary nodules, similar and decreased as detailed above. Nonspecific. 4. Right-sided Port-A-Cath tip at high IVC. consider repositioning. 5. Cholelithiasis. 6. Left nephrolithiasis. 7. Bilateral renal lesions. A medial upper pole renal lesion is suspicious for renal cell carcinoma. This could be re-evaluated at follow-up or more entirely characterized with routine outpatient pre and post contrast abdominal MRI.   02/08/2020 Surgery   LOWER ANTERIOR RESECTION RECTUM LAPAROSCOPIC by Dr Morton Stall at [facility]   Procedures   PR LAP,SURG,COLECTOMY, PARTIAL, W/ANAST   PR LAP, SURG MOBIL SPLENIC FL DUR PTL COLECTOMY   PR LAP, SURG ILEO/JEJUNO-STOMY   SIGMOID COLECTOMY LAPAROSCOPIC ASSISTED     02/08/2020 Pathology Results   Final Pathologic Diagnosis at [place], "IMA", EXCISION:              Two lymph nodes are negative for metastatic carcinoma (0/2).   B.  SIGMOID COLON AND RECTUM, LOWER ANTERIOR RESECTION: Residual well differentiated invasive adenocarcinoma of the rectum, arising from a tubulovillous adenoma with high grade  dyplasia.               Size: 2 mm in greatest dimension.  Tumor invades the submucosa.  Negative for perineural and lymphovascular invasion. Resection margins are negative for malignancy or dysplasia.  Twenty-two lymph nodes are negative for metastatic carcinoma (0/22). Complete mesorectum.               See CAP cancer protocol summary and comment.   C.  PROXIMAL DOUGHNUT, RESECTION:                      Segment of benign colonic tissue.              Negative for malignancy or dyplasia.    D.  DISTAL DOUGHNUT, RESECTION:                            Segment of benign colonic tissue.              Negative for malignancy or dyplasia.    PATHOLOGIC STAGE CLASSIFICATION (pTNM, AJCC 8th Edition)         TNM Descriptors:    y (post-treatment)     Primary Tumor (pT):    pT1     Regional Lymph Nodes (pN):    pN0      02/08/2020 Cancer Staging   Staging form: Colon and Rectum, AJCC 8th Edition - Pathologic stage from 02/08/2020: Stage I (ypT1, pN0, cM0) - Signed by Truitt Merle, MD on 04/06/2020 Stage prefix: Post-therapy Total positive nodes: 0 Residual tumor (R): R0 - None    07/05/2020 Imaging   CT C/A/P w/o contrast  IMPRESSION: 1. Mildly indistinct appearance of the fat and gallbladder wall separation in the upper abdomen, of uncertain significance and in the setting of abundant gallstones in the gallbladder lumen. Correlate with any clinical signs of abdominal pain and consider further evaluation with ultrasound as warranted. 2. RIGHT-sided Port-A-Cath tip at the high IVC near the diaphragmatic hiatus. Could consider repositioning as warranted. 3. 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 Consider follow-up evaluation, renal protocol study with with MRI with and without contrast as outlined in previous imaging. Hemorrhagic and/or proteinaceous cysts elsewhere in the kidneys. 4. LEFT nephrolithiasis as before with lower pole calculus approximately 6  mm. 5. No signs of metastatic disease with abundant stool in the colon following partial colonic resection. Correlate with any signs of constipation. 6. 4 mm nodule in the superior segment of the RIGHT lower lobe is unchanged. Attention on follow-up 7. Small midline periumbilical hernia containing fat. 8. Aortic atherosclerosis.      INTERVAL HISTORY:  Carlos [name] is here for a follow up of rectal cancer. He was last seen by me on 02/27/21. He presents to the clinic alone. His sister joined [place] during our appointment. He reports his bowel issues have "calmed down tremendously" in the last few weeks/month. He explains his gas is less and his bowel movements are much less frequent (previously ~3x a day). He notes he has cut out certain foods that cause gas.   All other systems were reviewed with the patient and are negative.  MEDICAL HISTORY:  Past Medical History:  Diagnosis Date   Anxiety    Benign prostatic hyperplasia 03/30/2013   10/1 IMO update   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/23/2019   rectal ca dx'd 06/2016   Seizures ([place])     SURGICAL HISTORY: Past Surgical History:  Procedure Laterality Date   COLON SURGERY     HIP SURGERY     INTRAMEDULLARY (IM) NAIL INTERTROCHANTERIC Left 12/11/2019   Procedure: INTRAMEDULLARY (IM) NAIL INTERTROCHANTRIC;  Surgeon: Rod Can, MD;  Location: [place];  Service: Orthopedics;  Laterality: Left;   IR REMOVAL TUN ACCESS W/ PORT W/O FL MOD SED  07/29/2020   PORTACATH PLACEMENT N/A 07/01/2019   Procedure: PORT ULTRASOUND GUIDED PLACEMENT;  Surgeon: Alphonsa Overall, MD;  Location: [facility];  Service: General;  Laterality: N/A;   PROSTATE SURGERY  2004   RECTAL SURGERY     TONSILECTOMY, ADENOIDECTOMY, BILATERAL MYRINGOTOMY AND TUBES      I have reviewed the social history and family history with the patient and they are unchanged from previous note.  ALLERGIES:  has No Known Allergies.  MEDICATIONS:  Current  Outpatient Medications  Medication Sig Dispense Refill   donepezil (ARICEPT) 10 MG tablet Take half tablet 5 mg daily 30 tablet 11   levETIRAcetam (KEPPRA) 1000 MG tablet Take 1 tablet twice a day (take with Levetiracetam '250mg'$  tablet for total of '1250mg'$  twice a day) 180 tablet 3   levETIRAcetam (KEPPRA) 250 MG tablet TAKE 5 TABLETS BY MOUTH TWICE DAILY 900 tablet 3   loperamide (IMODIUM) 2 MG capsule Take 1 capsule (2 mg total) by mouth every 6 (six) hours as needed for diarrhea or loose stools. 30 capsule 0   Multiple Vitamin (MULTIVITAMIN WITH MINERALS) TABS tablet Take 1 tablet by mouth daily.     NON FORMULARY Take 1 capsule by mouth daily. Mind works     psyllium (METAMUCIL) 58.6 % packet Take 1 packet by mouth daily.  No current facility-administered medications for this visit.    PHYSICAL EXAMINATION: ECOG PERFORMANCE STATUS: 0 - Asymptomatic  Vitals:   06/28/21 1046  BP: 140/74  Pulse: 76  Resp: 17  Temp: 98.1 F (36.7 C)  SpO2: 100%   Wt Readings from Last 3 Encounters:  06/28/21 135 lb (61.2 kg)  03/20/21 135 lb (61.2 kg)  02/27/21 134 lb 12.8 oz (61.1 kg)     GENERAL:alert, no distress and comfortable SKIN: skin color, texture, turgor are normal, no rashes or significant lesions EYES: normal, Conjunctiva are pink and non-injected, sclera clear  NECK: supple, thyroid normal size, non-tender, without nodularity LYMPH:  no palpable lymphadenopathy in the cervical, axillary  LUNGS: clear to auscultation and percussion with normal breathing effort HEART: regular rate & rhythm and no murmurs and no lower extremity edema ABDOMEN:abdomen soft, non-tender and normal bowel sounds Musculoskeletal:no cyanosis of digits and no clubbing  NEURO: alert & oriented x 3 with fluent speech, no focal motor/sensory deficits  LABORATORY DATA:  I have reviewed the data as listed    Latest Ref Rng & Units 06/28/2021   10:32 AM 02/27/2021    9:13 AM 10/07/2020   10:35 AM  CBC  WBC  4.0 - 10.5 K/uL 4.1   6.7   4.4    Hemoglobin 13.0 - 17.0 g/dL 13.1   12.8   12.5    Hematocrit 39.0 - 52.0 % 39.3   39.2   37.9    Platelets 150 - 400 K/uL 219   195   209          Latest Ref Rng & Units 06/28/2021   10:32 AM 02/27/2021    9:13 AM 02/20/2021    8:28 AM  CMP  Glucose 70 - 99 mg/dL 127   117     BUN 8 - 23 mg/dL 18   11     Creatinine 0.61 - 1.24 mg/dL 0.89   0.71   0.90    Sodium 135 - 145 mmol/L 142   139     Potassium 3.5 - 5.1 mmol/L 3.8   3.8     Chloride 98 - 111 mmol/L 106   106     CO2 22 - 32 mmol/L 30   25     Calcium 8.9 - 10.3 mg/dL 9.4   8.8     Total Protein 6.5 - 8.1 g/dL 7.1   7.2     Total Bilirubin 0.3 - 1.2 mg/dL 0.5   0.6     Alkaline Phos 38 - 126 U/L 86   101     AST 15 - 41 U/L 20   22     ALT 0 - 44 U/L 14   16         RADIOGRAPHIC STUDIES: I have personally reviewed the radiological images as listed and agreed with the findings in the report. No results found.    No orders of the defined types were placed in this encounter.  All questions were answered. The patient knows to call the clinic with any problems, questions or concerns. No barriers to learning was detected. The total time spent in the appointment was 25 minutes.     Truitt Merle, MD 06/28/2021   I, Wilburn Mylar, am acting as scribe for Truitt Merle, MD.   I have reviewed the above documentation for accuracy and completeness, and I agree with the above.

## 2021-07-11 DIAGNOSIS — Z85048 Personal history of other malignant neoplasm of rectum, rectosigmoid junction, and anus: Secondary | ICD-10-CM | POA: Diagnosis not present

## 2021-07-11 DIAGNOSIS — R140 Abdominal distension (gaseous): Secondary | ICD-10-CM | POA: Diagnosis not present

## 2021-08-28 ENCOUNTER — Other Ambulatory Visit: Payer: Self-pay

## 2021-09-05 ENCOUNTER — Other Ambulatory Visit: Payer: Self-pay

## 2021-09-06 DIAGNOSIS — D49511 Neoplasm of unspecified behavior of right kidney: Secondary | ICD-10-CM | POA: Diagnosis not present

## 2021-09-06 DIAGNOSIS — R972 Elevated prostate specific antigen [PSA]: Secondary | ICD-10-CM | POA: Diagnosis not present

## 2021-09-18 ENCOUNTER — Ambulatory Visit: Payer: Medicare Other | Admitting: Physician Assistant

## 2021-09-20 ENCOUNTER — Ambulatory Visit (INDEPENDENT_AMBULATORY_CARE_PROVIDER_SITE_OTHER): Payer: Medicare Other | Admitting: Physician Assistant

## 2021-09-20 ENCOUNTER — Encounter: Payer: Self-pay | Admitting: Physician Assistant

## 2021-09-20 DIAGNOSIS — F0390 Unspecified dementia without behavioral disturbance: Secondary | ICD-10-CM | POA: Diagnosis not present

## 2021-09-20 DIAGNOSIS — G40009 Localization-related (focal) (partial) idiopathic epilepsy and epileptic syndromes with seizures of localized onset, not intractable, without status epilepticus: Secondary | ICD-10-CM | POA: Diagnosis not present

## 2021-09-20 MED ORDER — DONEPEZIL HCL 10 MG PO TABS: ORAL_TABLET | ORAL | 4 refills | Status: DC

## 2021-09-20 MED ORDER — DONEPEZIL HCL 5 MG PO TABS: ORAL_TABLET | ORAL | 11 refills | Status: DC

## 2021-09-20 NOTE — Progress Notes (Signed)
Assessment/Plan:   Dementia likely due to Alzheimer's disease, vascular, impact of chemo and radiation, without behavioral disturbance Carlos [name] is a very pleasant 76 y.o. RH male seen today in follow up for memory loss. Patient is currently on donepezil 5 mg daily.  Today's MMSE is 27/30.  Patient is stable from the cognitive standpoint. Continue donepezil 5 mg daily Follow up in 6 months   Localization-related epilepsy, temporal lobe No further seizure episodes. Patient on levetiracetam 1000 mg twice daily, and live with the last attempt 250 mg twice daily, total 1250 mg twice daily, tolerating well Continue present therapy    Case discussed with Dr. Delice Lesch who agrees with the plan     Subjective:    This patient is accompanied in the office by his sister who supplements the history.  Previous records as well as any outside records available were reviewed prior to todays visit.    Any changes in memory since last visit?  Reports that his memory issues are about the same, he may have some short-term memory deficiencies, but the long-term memory is good.  He is able to remember conversations well.  He enjoys reading and near times in the [employer].  He does not like to do any crossword puzzles or word finding. Patient lives with: Sister  repeats oneself?  Endorsed by his sister Disoriented when walking into a room?  Patient denies   Leaving objects in unusual places?  Patient denies   Ambulates  with difficulty?   Patient denies   Recent falls?  Patient denies   Any head injuries?  Patient denies   seizures?   Well-controlled, no current events.  Denies aura, taste changes, dizziness or body jerks.  No back or neck pain, or falls. Wandering behavior?  Patient denies   Patient drives?   Short distances Any mood changes since last visit?  Patient denies.  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  Patient denies   Paranoia?  Patient denies   Patient reports that he sleeps well without vivid dreams, REM behavior or sleepwalking   History of sleep apnea?  Patient denies   Any hygiene concerns?  Patient denies   Independent of bathing and dressing?  Endorsed  Does the patient needs help with medications?  Denies Who is in charge of the finances?   is in charge    Any changes in appetite?  Patient denies   Patient have trouble swallowing? Patient denies   Does the patient cook?  Patient denies   Any kitchen accidents such as leaving the stove on? Patient denies   Any headaches?  Patient denies   Double vision? Patient denies   Any focal numbness or tingling?  Patient denies   Chronic back pain Patient denies   Unilateral weakness?  Patient denies   Any tremors?  Patient denies   Any history of anosmia?  Patient denies   Any incontinence of urine? Denies  Any bowel dysfunction?   Diarrhea, gas, upset stomach sometimes, in view of his history of GI cancer, which is well under control.   History on Initial Assessment 11/07/2018: This is a pleasant 76 year old right-handed man with no significant past medical history presenting for evaluation of memory loss and frequent falls. His sister is present to provide additional information. He thinks his memory is reasonable. He  states family has noticed more changes, they ask him things and "I don't give good answers." He endorses short-term memory changes. He lives with his 39 year old mother who has 4 caregivers. 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 He denies getting lost driving. He denies any missed bill payments. He is not on any medications. He denies any difficulties at work, he states it is routine work. His sister started noticing changes a year ago, he would forget conversations. He would forget he had already finished a task, for instance that he had already came from the grocery store.  She feels symptoms worsened in the past 5-6 months. Last month, he was visiting another sister and forgot the last portion of the trip, he did not get to his destination and just went home. He has told family he has gotten lost coming home from work, he arrived home an hour later (he has been in the same place for 7 years). His family is also concerned about an increase in frequency of falls. He has had 3 falls in the past 6 months. He reports injuring his foot and hitting his head with a fall in April, then hurting his back in July. He thinks it is due to clumsiness. No loss of consciousness. They are concerned about the possibility of a brain tumor, their father had a brain tumor. There is a history of memory loss in his mother and younger brother (secondary to stroke). He denies any history of significant head injuries or alcohol use.    He denies any headaches, dizziness, diplopia, dysarthria, dysphagia, neck/back pain, focal numbness/tingling/weakness, bowel/bladder dysfunction, anosmia, or tremors. He felt unwell last night and BP was 151/86, he went home early from work. He states he is on his feet 12 hours a day, and he is now required to work 6 days a week (mandatory due to Covid-19 changes), he was previously working 5 days a week. His sister is concerned he is not eating well, mostly sugars and hotdogs. He is overly tired and sleepy some days. Mood is good, he has "always been hyper, but now it more" per sister. No hallucinations or paranoia.    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 There were "impairments in retrieval/consolidation aspects of verbal memory, working memory, confrontation naming, and complex visuoconstructional  abilities. Additional variability was seen across processing speed and executive functioning. Visual encoding (i.e., learning) was also a relative weakness. Findings could suggest prominent left temporal dysfunction assuming normal lateralization in this right-handed individual. However, other aspects of expressive language (i.e., verbal fluency) were within normal limits. The aforementioned deficits, coupled with additional weaknesses across aspects of executive functioning and more advanced visuoconstructional abilities could be concerning for Alzheimer's disease. However appropriate retrieval/consolidation aspects of visual memory, coupled with strong semantic fluency scores, is inconsistent with what would typically be expected."    PREVIOUS MEDICATIONS:   CURRENT MEDICATIONS:  Outpatient Encounter Medications as of 09/20/2021  Medication Sig   donepezil (ARICEPT) 10 MG tablet Take half tablet 5 mg daily   donepezil (ARICEPT) 5 MG tablet Take half tablet 5 mg daily   levETIRAcetam (KEPPRA) 1000 MG tablet Take 1 tablet twice a day (take with Levetiracetam '250mg'$  tablet for total of '1250mg'$  twice a day)  levETIRAcetam (KEPPRA) 250 MG tablet TAKE 5 TABLETS BY MOUTH TWICE DAILY   loperamide (IMODIUM) 2 MG capsule Take 1 capsule (2 mg total) by mouth every 6 (six) hours as needed for diarrhea or loose stools.   Multiple Vitamin (MULTIVITAMIN WITH MINERALS) TABS tablet Take 1 tablet by mouth daily.   NON FORMULARY Take 1 capsule by mouth daily. Mind works   psyllium (METAMUCIL) 58.6 % packet Take 1 packet by mouth daily.   [DISCONTINUED] donepezil (ARICEPT) 10 MG tablet Take half tablet 5 mg daily   No facility-administered encounter medications on file as of 09/20/2021.       09/20/2021    2:00 PM 06/06/2020   10:00 AM  MMSE - Mini Mental State Exam  Orientation to time 5 5  Orientation to Place 5 5  Registration 3 3  Attention/ Calculation 5 3  Recall 0 2  Language- name 2 objects 2 2   Language- repeat 1 1  Language- follow 3 step command 3 3  Language- read & follow direction 1 1  Write a sentence 1 1  Copy design 1 0  Total score 27 26      10/25/2020    3:00 PM 11/07/2018    9:00 AM  Montreal Cognitive Assessment   Visuospatial/ Executive (0/5) 2 3  Naming (0/3) 3 3  Attention: Read list of digits (0/2) 1 2  Attention: Read list of letters (0/1) 1 1  Attention: Serial 7 subtraction starting at 100 (0/3) 3 3  Language: Repeat phrase (0/2) 2 2  Language : Fluency (0/1) 1 0  Abstraction (0/2) 2 2  Delayed Recall (0/5) 0 0  Orientation (0/6) 6 6  Total 21 22  Adjusted Score (based on education) 21 22    Objective:     PHYSICAL EXAMINATION:    VITALS:  There were no vitals filed for this visit.  GEN:  The patient appears stated age and is in NAD. HEENT:  Normocephalic, atraumatic.   Neurological examination:  General: NAD, well-groomed, appears stated age. Orientation: The patient is alert. Oriented to person, place and date Cranial nerves: There is good facial symmetry.The speech is fluent and clear. No aphasia or dysarthria. Fund of knowledge is appropriate. Recent and remote memory are impaired. Attention and concentration are reduced.  Able to name objects and repeat phrases.  Hearing is intact to conversational tone.    Sensation: Sensation is intact to light touch throughout Motor: Strength is at least antigravity x4. Tremors: none  DTR's 2/4 in UE/LE     Movement examination: Tone: There is normal tone in the UE/LE Abnormal movements:  no tremor.  No myoclonus.  No asterixis.   Coordination:  There is no decremation with RAM's. Normal finger to nose  Gait and Station: The patient has no difficulty arising out of a deep-seated chair without the use of the hands. The patient's stride length is good.  Gait is cautious and narrow.    Thank you for allowing [place] the opportunity to participate in the care of this nice patient. Please do not  hesitate to contact us for any questions or concerns.   Total time spent on today's visit was 36 minutes dedicated to this patient today, preparing to see patient, examining the patient, ordering tests and/or medications and counseling the patient, documenting clinical information in the EHR or other health record, independently interpreting results and communicating results to the patient/family, discussing treatment and goals, answering patient's questions and coordinating care.  Cc:  [name], MD  Sharene Butters 09/20/2021 2:02 PM

## 2021-09-20 NOTE — Patient Instructions (Signed)
It was a pleasure to see you today at our office.   Recommendations:  Meds: Follow up in 6  months Continue donepezil  5 mg daily.   Continue physical activity  Continue senior activities   Continue Keppra     RECOMMENDATIONS FOR ALL PATIENTS WITH MEMORY PROBLEMS: 1. Continue to exercise (Recommend 30 minutes of walking everyday, or 3 hours every week) 2. Increase social interactions - continue going to [place] and enjoy social gatherings with friends and family 3. Eat healthy, avoid fried foods and eat more fruits and vegetables 4. Maintain adequate blood pressure, blood sugar, and blood cholesterol level. Reducing the risk of stroke and cardiovascular disease also helps promoting better memory. 5. Avoid stressful situations. Live a simple life and avoid aggravations. Organize your time and prepare for the next day in anticipation. 6. Sleep well, avoid any interruptions of sleep and avoid any distractions in the bedroom that may interfere with adequate sleep quality 7. Avoid sugar, avoid sweets as there is a strong link between excessive sugar intake, diabetes, and cognitive impairment We discussed the Mediterranean diet, which has been shown to help patients reduce the risk of progressive memory disorders and reduces cardiovascular risk. This includes eating fish, eat fruits and green leafy vegetables, nuts like almonds and hazelnuts, walnuts, and also use olive oil. Avoid fast foods and fried foods as much as possible. Avoid sweets and sugar as sugar use has been linked to worsening of memory function.  There is always a concern of gradual progression of memory problems. If this is the case, then we may need to adjust level of care according to patient needs. Support, both to the patient and caregiver, should then be put into place.    The Alzheimer's Association is here all day, every day for people facing Alzheimer's disease through our free 24/7 Helpline: [phone]. The Helpline  provides reliable information and support to all those who need assistance, such as individuals living with memory loss, Alzheimer's or other dementia, caregivers, health care professionals and the public.  Our highly trained and knowledgeable staff can help you with: Understanding memory loss, dementia and Alzheimer's  Medications and other treatment options  General information about aging and brain health  Skills to provide quality care and to find the best care from professionals  Legal, financial and living-arrangement decisions Our Helpline also features: Confidential care consultation provided by master's level clinicians who can help with decision-making support, crisis assistance and education on issues families face every day  Help in a caller's preferred language using our translation service that features more than 200 languages and dialects  Referrals to local community programs, services and ongoing support     FALL PRECAUTIONS: Be cautious when walking. Scan the area for obstacles that may increase the risk of trips and falls. When getting up in the mornings, sit up at the edge of the bed for a few minutes before getting out of bed. Consider elevating the bed at the head end to avoid drop of blood pressure when getting up. Walk always in a well-lit room (use night lights in the walls). Avoid area rugs or power cords from appliances in the middle of the walkways. Use a walker or a cane if necessary and consider physical therapy for balance exercise. Get your eyesight checked regularly.  FINANCIAL OVERSIGHT: Supervision, especially oversight when making financial decisions or transactions is also recommended.  HOME SAFETY: Consider the safety of the kitchen when operating appliances like stoves, microwave oven, and  blender. Consider having supervision and share cooking responsibilities until no longer able to participate in those. Accidents with firearms and other hazards in the house  should be identified and addressed as well.   ABILITY TO BE LEFT ALONE: If patient is unable to contact 911 operator, consider using LifeLine, or when the need is there, arrange for someone to stay with patients. Smoking is a fire hazard, consider supervision or cessation. Risk of wandering should be assessed by caregiver and if detected at any point, supervision and safe proof recommendations should be instituted.  MEDICATION SUPERVISION: Inability to self-administer medication needs to be constantly addressed. Implement a mechanism to ensure safe administration of the medications.   DRIVING: Regarding driving, in patients with progressive memory problems, driving will be impaired. We advise to have someone else do the driving if trouble finding directions or if minor accidents are reported. Independent driving assessment is available to determine safety of driving.   If you are interested in the driving assessment, you can contact the following:  [employer] in [place]  [place] [phone] or [phone]      [place] refers to food and lifestyle choices that are based on the traditions of countries located on the [employer]. This way of eating has been shown to help prevent certain conditions and improve outcomes for people who have chronic diseases, like kidney disease and heart disease. What are tips for following this plan? Lifestyle  Cook and eat meals together with your family, when possible. Drink enough fluid to keep your urine clear or pale yellow. Be physically active every day. This includes: Aerobic exercise like running or swimming. Leisure activities like gardening, walking, or housework. Get 7-8 hours of sleep each night. If recommended by your health care provider, drink red wine in moderation. This means 1  glass a day for nonpregnant women and 2 glasses a day for men. A glass of wine equals 5 oz (150 mL). Reading food labels  Check the serving size of packaged foods. For foods such as rice and pasta, the serving size refers to the amount of cooked product, not dry. Check the total fat in packaged foods. Avoid foods that have saturated fat or trans fats. Check the ingredients list for added sugars, such as corn syrup. Shopping  At the grocery store, buy most of your food from the areas near the walls of the store. This includes: Fresh fruits and vegetables (produce). Grains, beans, nuts, and seeds. Some of these may be available in unpackaged forms or large amounts (in bulk). Fresh seafood. Poultry and eggs. Low-fat dairy products. Buy whole ingredients instead of prepackaged foods. Buy fresh fruits and vegetables in-season from local farmers markets. Buy frozen fruits and vegetables in resealable bags. If you do not have access to quality fresh seafood, buy precooked frozen shrimp or canned fish, such as tuna, salmon, or sardines. Buy small amounts of raw or cooked vegetables, salads, or olives from the deli or salad bar at your store. Stock your pantry so you always have certain foods on hand, such as olive oil, canned tuna, canned tomatoes, rice, pasta, and beans. Cooking  Cook foods with extra-virgin olive oil instead of using butter or other vegetable oils. Have meat as a side dish, and have vegetables or grains as your main dish. This means having meat in small portions or adding small amounts of meat to foods like  pasta or stew. Use beans or vegetables instead of meat in common dishes like chili or lasagna. Experiment with different cooking methods. Try roasting or broiling vegetables instead of steaming or sauteing them. Add frozen vegetables to soups, stews, pasta, or rice. Add nuts or seeds for added healthy fat at each meal. You can add these to yogurt, salads, or vegetable  dishes. Marinate fish or vegetables using olive oil, lemon juice, garlic, and fresh herbs. Meal planning  Plan to eat 1 vegetarian meal one day each week. Try to work up to 2 vegetarian meals, if possible. Eat seafood 2 or more times a week. Have healthy snacks readily available, such as: Vegetable sticks with hummus. Greek yogurt. Fruit and nut trail mix. Eat balanced meals throughout the week. This includes: Fruit: 2-3 servings a day Vegetables: 4-5 servings a day Low-fat dairy: 2 servings a day Fish, poultry, or lean meat: 1 serving a day Beans and legumes: 2 or more servings a week Nuts and seeds: 1-2 servings a day Whole grains: 6-8 servings a day Extra-virgin olive oil: 3-4 servings a day Limit red meat and sweets to only a few servings a month What are my food choices? Mediterranean diet Recommended Grains: Whole-grain pasta. Brown rice. Bulgar wheat. Polenta. Couscous. Whole-wheat bread. [place]. Vegetables: Artichokes. Beets. Broccoli. Cabbage. Carrots. Eggplant. Green beans. Chard. Kale. Spinach. Onions. Leeks. Peas. Squash. Tomatoes. Peppers. Radishes. Fruits: Apples. Apricots. Avocado. Berries. Bananas. Cherries. Dates. Figs. Grapes. Lemons. Melon. Oranges. Peaches. Plums. Pomegranate. Meats and other protein foods: Beans. Almonds. Sunflower seeds. Pine nuts. Peanuts. [place]. Salmon. Scallops. Shrimp. [place]. Tilapia. Clams. Oysters. Eggs. Dairy: Low-fat milk. Cheese. Greek yogurt. Beverages: Water. Red wine. Herbal tea. Fats and oils: Extra virgin olive oil. Avocado oil. Grape seed oil. Sweets and desserts: [place] yogurt with honey. Baked apples. Poached pears. Trail mix. Seasoning and other foods: Basil. Cilantro. Coriander. Cumin. Mint. Parsley. Sage. Rosemary. Tarragon. Garlic. Oregano. Thyme. Pepper. Balsalmic vinegar. Tahini. Hummus. Tomato sauce. Olives. Mushrooms. Limit these Grains: Prepackaged pasta or rice dishes. Prepackaged cereal with added  sugar. Vegetables: Deep fried potatoes (french fries). Fruits: Fruit canned in syrup. Meats and other protein foods: Beef. Pork. Lamb. Poultry with skin. Hot dogs. [place] Salines. Dairy: Ice cream. Sour cream. Whole milk. Beverages: Juice. Sugar-sweetened soft drinks. Beer. Liquor and spirits. Fats and oils: Butter. Canola oil. Vegetable oil. Beef fat (tallow). Lard. Sweets and desserts: Cookies. Cakes. Pies. Candy. Seasoning and other foods: Mayonnaise. Premade sauces and marinades. The items listed may not be a complete list. Talk with your dietitian about what dietary choices are right for you. Summary The Mediterranean diet includes both food and lifestyle choices. Eat a variety of fresh fruits and vegetables, beans, nuts, seeds, and whole grains. Limit the amount of red meat and sweets that you eat. Talk with your health care provider about whether it is safe for you to drink red wine in moderation. This means 1 glass a day for nonpregnant women and 2 glasses a day for men. A glass of wine equals 5 oz (150 mL). This information is not intended to replace advice given to you by your health care provider. Make sure you discuss any questions you have with your health care provider. Document Released: 09/15/2015 Document Revised: 10/18/2015 Document Reviewed: 09/15/2015 Elsevier Interactive Patient Education  2017 [employer].

## 2021-09-21 ENCOUNTER — Other Ambulatory Visit: Payer: Self-pay

## 2021-09-21 ENCOUNTER — Telehealth: Payer: Self-pay | Admitting: Physician Assistant

## 2021-09-21 MED ORDER — DONEPEZIL HCL 5 MG PO TABS: ORAL_TABLET | ORAL | 3 refills | Status: DC

## 2021-09-21 NOTE — Telephone Encounter (Signed)
Pharmacy needs clarification on donepezil directions. Is the patient to take 5 MG or 2.5 MG?

## 2021-09-21 NOTE — Telephone Encounter (Signed)
Verified with Alyse Low it is 10 mg take half

## 2021-09-29 DIAGNOSIS — G40909 Epilepsy, unspecified, not intractable, without status epilepticus: Secondary | ICD-10-CM | POA: Diagnosis not present

## 2021-09-29 DIAGNOSIS — K029 Dental caries, unspecified: Secondary | ICD-10-CM | POA: Diagnosis not present

## 2021-09-29 DIAGNOSIS — Z01818 Encounter for other preprocedural examination: Secondary | ICD-10-CM | POA: Diagnosis not present

## 2021-09-29 DIAGNOSIS — Z85038 Personal history of other malignant neoplasm of large intestine: Secondary | ICD-10-CM | POA: Diagnosis not present

## 2021-10-23 ENCOUNTER — Telehealth: Payer: Self-pay | Admitting: Physician Assistant

## 2021-10-23 MED ORDER — LEVETIRACETAM 1000 MG PO TABS: ORAL_TABLET | ORAL | 1 refills | Status: AC

## 2021-10-23 NOTE — Telephone Encounter (Signed)
[name] banks called, Trase is out of his generic keppra '1000mg'$ . He took his last one today. She needs a refill sent to [facility] on [address]  . Any questions call [phone] or [phone]

## 2021-10-23 NOTE — Telephone Encounter (Signed)
Refill sent in as requested. 

## 2021-10-30 ENCOUNTER — Telehealth: Payer: Self-pay

## 2021-10-30 ENCOUNTER — Other Ambulatory Visit: Payer: Self-pay

## 2021-10-30 NOTE — Telephone Encounter (Signed)
T/C from pt's sister asking if and when can pt get his covid booster and flu vaccine?  Also wants to know if he needs a CT scan before his 11/20 appt?  Please advise

## 2021-10-30 NOTE — Telephone Encounter (Signed)
Pt's HPOA called asking if pt could get the flu vaccine here at the [facility] and she wanted to know if Dr. Burr Medico wanted the pt's CT Scan done before the pt's f/u appt with her in November 2023.  Informed pt's HPOA that the [facility] currently does not have the flu vaccine in stock at this time but will hopefully having it in stock shortly.  Informed HPOA that the CT Scan is not needed until January 2024 per Dr. Ernestina Penna last office note with pt.  Dr. Burr Medico will most likely place the order for the CT Scan at the pt's November 2023 f/u appt.  Pt's HPOA verbalized understanding and had no further questions at this time.

## 2021-11-02 DIAGNOSIS — Z23 Encounter for immunization: Secondary | ICD-10-CM | POA: Diagnosis not present

## 2021-11-10 DIAGNOSIS — H26493 Other secondary cataract, bilateral: Secondary | ICD-10-CM | POA: Diagnosis not present

## 2021-11-10 DIAGNOSIS — H35373 Puckering of macula, bilateral: Secondary | ICD-10-CM | POA: Diagnosis not present

## 2021-11-10 DIAGNOSIS — H43813 Vitreous degeneration, bilateral: Secondary | ICD-10-CM | POA: Diagnosis not present

## 2021-11-18 DIAGNOSIS — R197 Diarrhea, unspecified: Secondary | ICD-10-CM | POA: Diagnosis not present

## 2021-11-23 ENCOUNTER — Telehealth: Payer: Self-pay | Admitting: Physician Assistant

## 2021-11-23 NOTE — Telephone Encounter (Signed)
Will schedule an earlier appt, family asking to do so.

## 2021-11-23 NOTE — Telephone Encounter (Signed)
Pt's sister called in wanting to speak with someone. She has some questions about his behavior. She stated his anxiety level has been higher.

## 2021-11-30 ENCOUNTER — Ambulatory Visit: Payer: Medicare Other

## 2021-12-07 ENCOUNTER — Encounter: Payer: Self-pay | Admitting: Physician Assistant

## 2021-12-07 ENCOUNTER — Ambulatory Visit (INDEPENDENT_AMBULATORY_CARE_PROVIDER_SITE_OTHER): Payer: Medicare Other | Admitting: Physician Assistant

## 2021-12-07 VITALS — BP 142/78 | HR 87 | Resp 20 | Ht 64.0 in | Wt 136.0 lb

## 2021-12-07 DIAGNOSIS — G40009 Localization-related (focal) (partial) idiopathic epilepsy and epileptic syndromes with seizures of localized onset, not intractable, without status epilepticus: Secondary | ICD-10-CM

## 2021-12-07 DIAGNOSIS — F0390 Unspecified dementia without behavioral disturbance: Secondary | ICD-10-CM

## 2021-12-07 MED ORDER — MEMANTINE HCL 5 MG PO TABS: 5.0000 mg | ORAL_TABLET | Freq: Every evening | ORAL | 11 refills | Status: DC

## 2021-12-07 NOTE — Patient Instructions (Addendum)
It was a pleasure to see you today at our office.   Recommendations:  Meds: Follow up in Feb as scheduled  Continue donepezil  5 mg daily.   Start memantine 5 mg at night  Continue physical activity  Continue senior activities  Continue Keppra  Recommend Psychiatric evaluation for ADD/ADHD    RECOMMENDATIONS FOR ALL PATIENTS WITH MEMORY PROBLEMS: 1. Continue to exercise (Recommend 30 minutes of walking everyday, or 3 hours every week) 2. Increase social interactions - continue going to [place] and enjoy social gatherings with friends and family 3. Eat healthy, avoid fried foods and eat more fruits and vegetables 4. Maintain adequate blood pressure, blood sugar, and blood cholesterol level. Reducing the risk of stroke and cardiovascular disease also helps promoting better memory. 5. Avoid stressful situations. Live a simple life and avoid aggravations. Organize your time and prepare for the next day in anticipation. 6. Sleep well, avoid any interruptions of sleep and avoid any distractions in the bedroom that may interfere with adequate sleep quality 7. Avoid sugar, avoid sweets as there is a strong link between excessive sugar intake, diabetes, and cognitive impairment We discussed the Mediterranean diet, which has been shown to help patients reduce the risk of progressive memory disorders and reduces cardiovascular risk. This includes eating fish, eat fruits and green leafy vegetables, nuts like almonds and hazelnuts, walnuts, and also use olive oil. Avoid fast foods and fried foods as much as possible. Avoid sweets and sugar as sugar use has been linked to worsening of memory function.  There is always a concern of gradual progression of memory problems. If this is the case, then we may need to adjust level of care according to patient needs. Support, both to the patient and caregiver, should then be put into place.    The Alzheimer's Association is here all day, every day for people  facing Alzheimer's disease through our free 24/7 Helpline: [phone]. The Helpline provides reliable information and support to all those who need assistance, such as individuals living with memory loss, Alzheimer's or other dementia, caregivers, health care professionals and the public.  Our highly trained and knowledgeable staff can help you with: Understanding memory loss, dementia and Alzheimer's  Medications and other treatment options  General information about aging and brain health  Skills to provide quality care and to find the best care from professionals  Legal, financial and living-arrangement decisions Our Helpline also features: Confidential care consultation provided by master's level clinicians who can help with decision-making support, crisis assistance and education on issues families face every day  Help in a caller's preferred language using our translation service that features more than 200 languages and dialects  Referrals to local community programs, services and ongoing support     FALL PRECAUTIONS: Be cautious when walking. Scan the area for obstacles that may increase the risk of trips and falls. When getting up in the mornings, sit up at the edge of the bed for a few minutes before getting out of bed. Consider elevating the bed at the head end to avoid drop of blood pressure when getting up. Walk always in a well-lit room (use night lights in the walls). Avoid area rugs or power cords from appliances in the middle of the walkways. Use a walker or a cane if necessary and consider physical therapy for balance exercise. Get your eyesight checked regularly.  FINANCIAL OVERSIGHT: Supervision, especially oversight when making financial decisions or transactions is also recommended.  HOME SAFETY: Consider the  safety of the kitchen when operating appliances like stoves, microwave oven, and blender. Consider having supervision and share cooking responsibilities until no  longer able to participate in those. Accidents with firearms and other hazards in the house should be identified and addressed as well.   ABILITY TO BE LEFT ALONE: If patient is unable to contact 911 operator, consider using LifeLine, or when the need is there, arrange for someone to stay with patients. Smoking is a fire hazard, consider supervision or cessation. Risk of wandering should be assessed by caregiver and if detected at any point, supervision and safe proof recommendations should be instituted.  MEDICATION SUPERVISION: Inability to self-administer medication needs to be constantly addressed. Implement a mechanism to ensure safe administration of the medications.   DRIVING: Regarding driving, in patients with progressive memory problems, driving will be impaired. We advise to have someone else do the driving if trouble finding directions or if minor accidents are reported. Independent driving assessment is available to determine safety of driving.   If you are interested in the driving assessment, you can contact the following:  [employer] in [place]  [place] [phone] or [phone]      [place] refers to food and lifestyle choices that are based on the traditions of countries located on the [employer]. This way of eating has been shown to help prevent certain conditions and improve outcomes for people who have chronic diseases, like kidney disease and heart disease. What are tips for following this plan? Lifestyle  Cook and eat meals together with your family, when possible. Drink enough fluid to keep your urine clear or pale yellow. Be physically active every day. This includes: Aerobic exercise like running or swimming. Leisure activities like gardening, walking, or housework. Get 7-8 hours of sleep each  night. If recommended by your health care provider, drink red wine in moderation. This means 1 glass a day for nonpregnant women and 2 glasses a day for men. A glass of wine equals 5 oz (150 mL). Reading food labels  Check the serving size of packaged foods. For foods such as rice and pasta, the serving size refers to the amount of cooked product, not dry. Check the total fat in packaged foods. Avoid foods that have saturated fat or trans fats. Check the ingredients list for added sugars, such as corn syrup. Shopping  At the grocery store, buy most of your food from the areas near the walls of the store. This includes: Fresh fruits and vegetables (produce). Grains, beans, nuts, and seeds. Some of these may be available in unpackaged forms or large amounts (in bulk). Fresh seafood. Poultry and eggs. Low-fat dairy products. Buy whole ingredients instead of prepackaged foods. Buy fresh fruits and vegetables in-season from local farmers markets. Buy frozen fruits and vegetables in resealable bags. If you do not have access to quality fresh seafood, buy precooked frozen shrimp or canned fish, such as tuna, salmon, or sardines. Buy small amounts of raw or cooked vegetables, salads, or olives from the deli or salad bar at your store. Stock your pantry so you always have certain foods on hand, such as olive oil, canned tuna, canned tomatoes, rice, pasta, and beans. Cooking  Cook foods with extra-virgin olive oil instead of using butter or other vegetable oils. Have meat as a side dish, and have vegetables or grains as your main dish. This means having meat  in small portions or adding small amounts of meat to foods like pasta or stew. Use beans or vegetables instead of meat in common dishes like chili or lasagna. Experiment with different cooking methods. Try roasting or broiling vegetables instead of steaming or sauteing them. Add frozen vegetables to soups, stews, pasta, or rice. Add nuts or seeds  for added healthy fat at each meal. You can add these to yogurt, salads, or vegetable dishes. Marinate fish or vegetables using olive oil, lemon juice, garlic, and fresh herbs. Meal planning  Plan to eat 1 vegetarian meal one day each week. Try to work up to 2 vegetarian meals, if possible. Eat seafood 2 or more times a week. Have healthy snacks readily available, such as: Vegetable sticks with hummus. Greek yogurt. Fruit and nut trail mix. Eat balanced meals throughout the week. This includes: Fruit: 2-3 servings a day Vegetables: 4-5 servings a day Low-fat dairy: 2 servings a day Fish, poultry, or lean meat: 1 serving a day Beans and legumes: 2 or more servings a week Nuts and seeds: 1-2 servings a day Whole grains: 6-8 servings a day Extra-virgin olive oil: 3-4 servings a day Limit red meat and sweets to only a few servings a month What are my food choices? Mediterranean diet Recommended Grains: Whole-grain pasta. Brown rice. Bulgar wheat. Polenta. Couscous. Whole-wheat bread. [place]. Vegetables: Artichokes. Beets. Broccoli. Cabbage. Carrots. Eggplant. Green beans. Chard. Kale. Spinach. Onions. Leeks. Peas. Squash. Tomatoes. Peppers. Radishes. Fruits: Apples. Apricots. Avocado. Berries. Bananas. Cherries. Dates. Figs. Grapes. Lemons. Melon. Oranges. Peaches. Plums. Pomegranate. Meats and other protein foods: Beans. Almonds. Sunflower seeds. Pine nuts. Peanuts. [place]. Salmon. Scallops. Shrimp. [place]. Tilapia. Clams. Oysters. Eggs. Dairy: Low-fat milk. Cheese. Greek yogurt. Beverages: Water. Red wine. Herbal tea. Fats and oils: Extra virgin olive oil. Avocado oil. Grape seed oil. Sweets and desserts: [place] yogurt with honey. Baked apples. Poached pears. Trail mix. Seasoning and other foods: Basil. Cilantro. Coriander. Cumin. Mint. Parsley. Sage. Rosemary. Tarragon. Garlic. Oregano. Thyme. Pepper. Balsalmic vinegar. Tahini. Hummus. Tomato sauce. Olives. Mushrooms. Limit  these Grains: Prepackaged pasta or rice dishes. Prepackaged cereal with added sugar. Vegetables: Deep fried potatoes (french fries). Fruits: Fruit canned in syrup. Meats and other protein foods: Beef. Pork. Lamb. Poultry with skin. Hot dogs. [place] Salines. Dairy: Ice cream. Sour cream. Whole milk. Beverages: Juice. Sugar-sweetened soft drinks. Beer. Liquor and spirits. Fats and oils: Butter. Canola oil. Vegetable oil. Beef fat (tallow). Lard. Sweets and desserts: Cookies. Cakes. Pies. Candy. Seasoning and other foods: Mayonnaise. Premade sauces and marinades. The items listed may not be a complete list. Talk with your dietitian about what dietary choices are right for you. Summary The Mediterranean diet includes both food and lifestyle choices. Eat a variety of fresh fruits and vegetables, beans, nuts, seeds, and whole grains. Limit the amount of red meat and sweets that you eat. Talk with your health care provider about whether it is safe for you to drink red wine in moderation. This means 1 glass a day for nonpregnant women and 2 glasses a day for men. A glass of wine equals 5 oz (150 mL). This information is not intended to replace advice given to you by your health care provider. Make sure you discuss any questions you have with your health care provider. Document Released: 09/15/2015 Document Revised: 10/18/2015 Document Reviewed: 09/15/2015 Elsevier Interactive Patient Education  2017 [employer].
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
## 2021-12-14 ENCOUNTER — Ambulatory Visit (INDEPENDENT_AMBULATORY_CARE_PROVIDER_SITE_OTHER): Payer: Medicare Other | Admitting: Family Medicine

## 2021-12-14 VITALS — BP 120/67 | HR 77 | Ht 64.0 in | Wt 135.0 lb

## 2021-12-14 DIAGNOSIS — F03A18 Unspecified dementia, mild, with other behavioral disturbance: Secondary | ICD-10-CM

## 2021-12-14 DIAGNOSIS — R4586 Emotional lability: Secondary | ICD-10-CM

## 2021-12-14 DIAGNOSIS — K219 Gastro-esophageal reflux disease without esophagitis: Secondary | ICD-10-CM

## 2021-12-14 DIAGNOSIS — R2689 Other abnormalities of gait and mobility: Secondary | ICD-10-CM

## 2021-12-14 DIAGNOSIS — G3184 Mild cognitive impairment, so stated: Secondary | ICD-10-CM

## 2021-12-14 DIAGNOSIS — R7989 Other specified abnormal findings of blood chemistry: Secondary | ICD-10-CM | POA: Diagnosis not present

## 2021-12-14 DIAGNOSIS — G479 Sleep disorder, unspecified: Secondary | ICD-10-CM

## 2021-12-14 MED ORDER — MIRTAZAPINE 7.5 MG PO TABS: 7.5000 mg | ORAL_TABLET | Freq: Every day | ORAL | 0 refills | Status: DC

## 2021-12-14 NOTE — Patient Instructions (Addendum)
It was wonderful to see you today. Thank you for allowing [place] to be a part of your care. Below is a short summary of what we discussed at your visit today:  Mood changes, potential anxiety Today we obtained some blood work. If the results are normal, I will send you a letter or MyChart message. If the results are abnormal, I will give you a call.    You asked [place] about exercising. We recommend exercise as a great way to maintain mental and physical health. Walking is fantastic exercise. Also water aerobics in the pool are great for heart health.   Anxiety and sleep We want you to start mirtazapine 7.5 mg at bed time. Take it 30 minutes before you go to bed.   IF YOU HAVE ANY PROBLEMS with this medication, STOP THE MEDICATION and call our office at [phone].  Come back in 4 weeks to evaluate how you are responding to this medication.  See your appointment details on the next page.   Continue going to your counselor on schedule. We believe this will be a wonderful complement to this medication.  Cognitive and mood evaluation We will send our findings and conclusions to your primary care doctor.   We do not believe the recent changes in behavior are related to ADD (attention deficit disorder). The behavior changes are much more likely related to the process of cognitive decline and development of dementia.   Dr. Casimiro Needle is a geriatric psychiatrist here in [place]. He specializes in geriatric psychiatry. We recommend you try to get in with him. Look him up and call his office to make an appointment.  If you have any questions or concerns, please do not hesitate to contact us via phone or MyChart message.   Ezequiel Essex, MD

## 2021-12-15 ENCOUNTER — Encounter: Payer: Self-pay | Admitting: Family Medicine

## 2021-12-15 DIAGNOSIS — K219 Gastro-esophageal reflux disease without esophagitis: Secondary | ICD-10-CM | POA: Insufficient documentation

## 2021-12-15 DIAGNOSIS — F0390 Unspecified dementia without behavioral disturbance: Secondary | ICD-10-CM | POA: Insufficient documentation

## 2021-12-15 DIAGNOSIS — R2689 Other abnormalities of gait and mobility: Secondary | ICD-10-CM

## 2021-12-15 HISTORY — DX: Other abnormalities of gait and mobility: R26.89

## 2021-12-15 HISTORY — DX: Gastro-esophageal reflux disease without esophagitis: K21.9

## 2021-12-15 LAB — VITAMIN B12: Vitamin B-12: 823 pg/mL (ref 232–1245)

## 2021-12-15 LAB — TSH: TSH: 0.397 u[IU]/mL — ABNORMAL LOW (ref 0.450–4.500)

## 2021-12-15 MED ORDER — FAMOTIDINE 20 MG PO TABS: 20.0000 mg | ORAL_TABLET | Freq: Two times a day (BID) | ORAL | 0 refills | Status: AC | PRN

## 2021-12-15 NOTE — Assessment & Plan Note (Signed)
Failed 3 step tandem test. Patient does feel his balance is "off." He has a history of falls with injury.  Will discuss at follow up taking part in outpt PT

## 2021-12-15 NOTE — Progress Notes (Addendum)
Provider:  Lissa Morales, MD Location:      Place of Service:     PCP: [name], MD Patient Care Team: [name], MD as PCP - General (Family Medicine) Cameron Sprang, MD as Consulting Physician (Neurology) Truitt Merle, MD as Consulting Physician (Hematology) Ronnette Juniper, MD as Consulting Physician (Gastroenterology) Elease Hashimoto (Neurology) Hester Mates, Justin as Referring Physician [facility])  Extended Emergency Contact Information Primary Emergency Contact: Banks,[name] Address: [address]          [place], [place] [address] Johnnette Litter of [place] Phone: [phone] Mobile Phone: [phone] Relation: Sister Secondary Emergency Contact: Lyanne Co Home Phone: [phone] Mobile Phone: [phone] Relation: Sister  Code Status: DNR Goals of Care: Advanced Directive information    12/14/2021    2:30 PM  Advanced Directives  Does Patient Have a Medical Advance Directive? Yes  Type of Advance Directive Living will  Does patient want to make changes to medical advance directive? No - Patient declined     [facility]:   Patient is accompanied by  sister who lives in [place] Primary caregiver:  Self and [place] aide 6 days per week for 4 hours a day- primarily cooking and cleaning Patient's Currently living arrangement:  alone Patient information was obtained from imaging reports: Brain MRI (12/08/21), lab reports, and Consultation notes from [facility], most recent 12/07/21.   patient and relative(s). History/Exam limitations:  dementia Primary Care Provider:   [name], MD Referring provider:  self-referral Reason for referral:  patient says he was told by his new counselor that he needed an geriatric assessment, though both patient and his sister were not clear on the reason this was needed.  After some discussion, we believe it was related to some behavioral concerns.  HPI by problems:  Chief Complaint  Patient  presents with   Other    Behavioral issues   Mr Gharibian had a neuropsychological evaluation by Dr Kennon Holter in 01/2019 on referral by Dr Delice Lesch of [facility] for memory loss.  Summary impression report that Mr Hemann has impairments in consolidating and retrieving verbal memory/working memory/naming. He has some impairment in learning. He demonstrated rapid loss of new learned information.  "Behavioral changes were not suggestive of any personality changes not difficulty communicating with other, making a diagnosis on frontotemporal dementia spectrum less likely."  Repeat neuropsychological testing in 12 months was recommended to track trajectory of cognitive decline and improve diagnostic clarity.  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 He was started on donepezil.   His sister said his new counselor thought Mr Binstock may have had undiagnosed AD/HD when he was young.  She attributes his tendency to talk over others in a dialog.   He needs assistance with instrumental activity of making decisions about his finances.  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  He still drives, but has gotten lost in the past.  A HH aid does cooking and cleaning.  He reads and watches TV as his recreation.   He is taking donezepil 5 mg daily and neurology just added namenda 5 mg on 12/07/21.   He may have some difficulty remembering recent conversations. He does frequently repeat the same ideas related to his lack of organization and drive as the source of his difficulties.   He uses his smart phone for just making and receiving calls.  Behavioral issues: Denies Irritability and anxiousness Decrease in appetite - he has an easily upset stomach Difficulty falling and staying asleep.  Not awakening to pee or because of "nerve pain" in his feet.  Denies depressed mood or loss of pleasure in life No delusions, no fear that others  are stealing from him or out to hurt him No wandering, no pacing No resistance to help He and his sister deny disinhibition, like impulsively talking to strangers like friends, or being physically or verbally abusive   Geriatric Depression Scale:  3 / 15  PHQ-9: [place] Office Visit from 12/14/2021 in [place]  PHQ-9 Total Score 1          12/14/2021    3:00 PM  GAD 7 : Generalized Anxiety Score  Nervous, Anxious, on Edge 2  Control/stop worrying 0  Worry too much - different things 0  Trouble relaxing 0  Restless 2  Easily annoyed or irritable 1  Afraid - awful might happen 0  Total GAD 7 Score 5  Anxiety Difficulty -     Outpatient Encounter Medications as of 12/14/2021  Medication Sig   dicyclomine (BENTYL) 20 MG tablet Take 20 mg by mouth 3 (three) times daily as needed.   famotidine (PEPCID) 20 MG tablet Take 20 mg by mouth 2 (two) times daily as needed.   mirtazapine (REMERON) 7.5 MG tablet Take 1 tablet (7.5 mg total) by mouth at bedtime. For insomnia.   donepezil (ARICEPT) 10 MG tablet Take half tablet 5 mg daily   donepezil (ARICEPT) 5 MG tablet Take half tablet 5 mg daily   levETIRAcetam (KEPPRA) 1000 MG tablet Take 1 tablet twice a day (take with Levetiracetam '250mg'$  tablet for total of '1250mg'$  twice a day)   levETIRAcetam (KEPPRA) 250 MG tablet TAKE 5 TABLETS BY MOUTH TWICE DAILY   loperamide (IMODIUM) 2 MG capsule Take 1 capsule (2 mg total) by mouth every 6 (six) hours as needed for diarrhea or loose stools.   memantine (NAMENDA) 5 MG tablet Take 1 tablet (5 mg total) by mouth at bedtime.   methylcellulose (CITRUCEL) oral powder Take by mouth.   Multiple Vitamin (MULTIVITAMIN WITH MINERALS) TABS tablet Take 1 tablet by mouth daily.   NON FORMULARY Take 1 capsule by mouth daily. Mind works   [DISCONTINUED] psyllium (METAMUCIL) 58.6 % packet Take 1 packet by mouth daily.    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    Priority: High   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/23/2019    Priority: High   Gastroesophageal reflux 12/15/2021    Priority: Medium    Abnormality of gait 02/01/2020    Priority: Medium    Port-A-Cath in place 09/11/2019    Priority: Medium    Benign prostatic hyperplasia 03/30/2013    Priority: Medium    Balance problem 12/15/2021    Priority: Low   Recurrent falls 01/21/2020    Priority: Low   Back pain 01/21/2020    Priority: Low   Low TSH level 12/25/2021   Macular puckering, 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   Bilateral posterior capsular opacification 12/22/2021   Past Medical History:  Diagnosis Date  Acute blood loss anemia    Acute encephalopathy 09/11/2019   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   Anxiety    Benign prostatic hyperplasia 03/30/2013   10/1 IMO update   Bilateral posterior capsular opacification 12/22/2021   Breakthrough seizure ([place]) 12/08/2019   Closed comminuted intertrochanteric fracture of left femur ([place])    Combined forms of age-related cataract of left eye 12/22/2021   Combined forms of age-related cataract of right eye 12/22/2021   Hypoalbuminemia due to protein-calorie malnutrition (HCC)    Left hip pain    Macular puckering, 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/23/2019   Posterior vitreous detachment of both eyes 12/22/2021   Pressure ulcer, stage 2 ([place]) 12/12/2019   rectal ca dx'd 06/2016   Seizures ([place])    Past Surgical History:  Procedure Laterality Date   COLON SURGERY     HIP SURGERY     INTRAMEDULLARY (IM) NAIL INTERTROCHANTERIC Left 12/11/2019   Procedure:  INTRAMEDULLARY (IM) NAIL INTERTROCHANTRIC;  Surgeon: Rod Can, MD;  Location: [facility];  Service: Orthopedics;  Laterality: Left;   IR REMOVAL TUN ACCESS W/ PORT W/O FL MOD SED  07/29/2020   PORTACATH PLACEMENT N/A 07/01/2019   Procedure: PORT ULTRASOUND GUIDED PLACEMENT;  Surgeon: Alphonsa Overall, MD;  Location: [facility];  Service: General;  Laterality: N/A;   PROSTATE SURGERY  2004   RECTAL SURGERY     TONSILECTOMY, ADENOIDECTOMY, BILATERAL MYRINGOTOMY AND TUBES     Family History  Problem Relation Age of Onset   Osteoporosis Mother    Dementia Mother    Diabetes Mother    Brain cancer Father    Social History   Socioeconomic History   Marital status: Single    Spouse name: Not on file   Number of children: 0   Years of education: 16   Highest education level: Bachelor's degree (e.g., BA, AB, BS)  Occupational History   Not on file  Tobacco Use   Smoking status: Never   Smokeless tobacco: Never  Vaping Use   Vaping Use: Never used  Substance and Sexual Activity   Alcohol use: No   Drug use: No   Sexual activity: Not Currently  Other Topics Concern   Not on file  Social History Narrative   4 year College- history major      Pts mother lives with him   Right handed   Two story home   Drinks tea   Used to live in [place]   retired   Cardiovascular Risk Factors:  none  Educational History: 16 years formal education Personal History of Seizures: Yes -  Personal History of Stroke: Question of old hemorrhage in cerebellum Vs hemanioma Personal History of Head Trauma: No -  Personal History of Psychiatric Disorders: No - No diagnosed major psychiatric disorder.  No history of psychiatric hospitalization  Basic Activities of Daily Living  Dressing: Self-care Eating: Self-care Ambulation: Self-care Toileting: Self-care Bathing: Self-care  Instrumental Activities of Daily Living Shopping: Partial assistance House/Yard Work: Partial assistance Administration of  medications: Self-care Finances: Total assistance Telephone: Self-care Transportation: Self-care  Mobility Assist Devices: one point cane - not with him during visit.   Caregivers in home: none  Formal Home Health Assistance  Physical Therapy: no  Occupational Therapy: no             Home Aid / Personal Care Service: yes           FALLS in last five office visits:  12/14/2021    2:29 PM 12/07/2021    1:02 PM 09/20/2021    9:16 AM 03/20/2021   11:20 AM 12/13/2020   11:24 AM  Fall Risk   Falls in the past year? 0 0 0 0 0  Number falls in past yr: 0 0 0 0 0  Injury with Fall? 0 0 0 0 0  Follow up  Falls evaluation completed       Health Maintenance reviewed: Immunization History  Administered Date(s) Administered   Influenza, High Dose Seasonal PF 04/17/2018   Influenza,inj,Quad PF,6+ Mos 10/31/2018, 10/27/2019   PFIZER(Purple Top)SARS-COV-2 Vaccination 03/14/2019, 04/04/2019, 09/30/2019   Pneumococcal Conjugate-13 01/16/2019   Tdap 01/16/2019   Zoster, Live 01/09/2019, 06/24/2019   Health Maintenance Topics with due status: Overdue     Topic Date Due   Medicare Annual Wellness (AWV) Never done   Hepatitis C Screening Never done   Zoster Vaccines- Shingrix Never done   COVID-19 Vaccine 11/25/2019   Pneumonia Vaccine 17+ Years old 01/16/2020   INFLUENZA VACCINE 09/05/2021    Diet: Regular Nutritional supplements: none   Vital Signs Weight: 135 lb (61.2 kg) Body mass index is 23.17 kg/m. Estimated Creatinine Clearance: 58.5 mL/min (by C-G formula based on SCr of 0.9 mg/dL). Body surface area is 1.66 meters squared. Vitals:   12/14/21 1431  BP: 120/67  Pulse: 77  SpO2: 97%  Weight: 135 lb (61.2 kg)  Height: '5\' 4"'$  (1.626 m)   Wt Readings from Last 3 Encounters:  12/25/21 133 lb 3.2 oz (60.4 kg)  12/14/21 135 lb (61.2 kg)  12/07/21 136 lb (61.7 kg)   Hearing Screening   '250Hz'$  '500Hz'$  '1000Hz'$  '2000Hz'$  '4000Hz'$   Right ear '20 20 25 20 20  '$ Left ear '20 20 25 20  25  '$ Vision Screening - Comments:: Patient is followed by [facility]. Last visit was August 2023.  Physical Examination:  VS reviewed Physical Exam  Vitals:   12/14/21 1431  BP: 120/67  Pulse: 77  SpO2: 97%    HEENT: Bilateral EAC adequately patent, I.e., able to see TMs General physical exam: well-dressed and groomed. Pleasant and cooperative. Cardiac: Regular rate and rhythm without murmur. No carotid bruits Lungs: Clear to auscultation.   No other insights were derived from the general Exam   Parkinsonism Yes '[]'$  No '[x]'$   Rigidity Ab nl ? Yes '[]'$  No '[x]'$   Gait Abnl ? X WNL '[]'$  slow, '[]'$  petit ped, '[]'$  heel past toe no present, '[]'$  forward stoop present, '[]'$  en bloc turn present, '[]'$  symmetric stance phase, '[]'$  leg pasts midline present Tone Abnl ? Yes '[]'$  No '[]'$ x  Tremor ? Yes '[]'$  No '[x]'$     Sit-to-Stand Test: Does not need arms to push up.  Does not need to [place] prior to [employer] Stand Test:  Feet Side-by-side: Yes.       Feet Semi-tandem: Yes.       Feet Tandem: No.             No data to display             09/20/2021    2:00 PM 06/06/2020   10:00 AM  MMSE - Mini Mental State Exam  Orientation to time 5 5  Orientation to Place 5 5  Registration 3 3  Attention/ Calculation 5 3  Recall 0 2  Language- name 2 objects 2 2  Language- repeat 1 1  Language- follow 3 step command 3 3  Language- read & follow direction  1 1  Write a sentence 1 1  Copy design 1 0  Total score 27 26           10/25/2020    3:00 PM 11/07/2018    9:00 AM  Montreal Cognitive Assessment   Visuospatial/ Executive (0/5) 2 3  Naming (0/3) 3 3  Attention: Read list of digits (0/2) 1 2  Attention: Read list of letters (0/1) 1 1  Attention: Serial 7 subtraction starting at 100 (0/3) 3 3  Language: Repeat phrase (0/2) 2 2  Language : Fluency (0/1) 1 0  Abstraction (0/2) 2 2  Delayed Recall (0/5) 0 0  Orientation (0/6) 6 6  Total 21 22  Adjusted Score (based on education) 21 22      Labs No components found for: "VITAMIND"  Lab Results  Component Value Date   VOZDGUYQ03 474 12/14/2021    Lab Results  Component Value Date   FOLATE 76.9 09/12/2019    Lab Results  Component Value Date   TSH 0.397 (L) 12/14/2021    No results found for: "RPR"  Lab Results  Component Value Date   HIV Non Reactive 09/12/2019      Chemistry      Component Value Date/Time   NA 141 12/25/2021 1101   K 4.1 12/25/2021 1101   CL 106 12/25/2021 1101   CO2 32 12/25/2021 1101   BUN 14 12/25/2021 1101   CREATININE 0.90 12/25/2021 1101      Component Value Date/Time   CALCIUM 9.7 12/25/2021 1101   ALKPHOS 78 12/25/2021 1101   AST 22 12/25/2021 1101   ALT 16 12/25/2021 1101   BILITOT 0.6 12/25/2021 1101       '@10RELATIVEDAYS'$ @ Hearing Screening   '250Hz'$  '500Hz'$  '1000Hz'$  '2000Hz'$  '4000Hz'$   Right ear '20 20 25 20 20  '$ Left ear '20 20 25 20 25  '$ Vision Screening - Comments:: Patient is followed by [facility]. Last visit was August 2023. Lab Results  Component Value Date   WBC 3.9 (L) 12/25/2021   HGB 14.4 12/25/2021   HCT 42.8 12/25/2021   MCV 91.1 12/25/2021   PLT 192 12/25/2021    Results for orders placed or performed in visit on 12/25/21 (from the past 24 hour(s))  CMP ([place] only)   Collection Time: 12/25/21 11:01 AM  Result Value Ref Range   Sodium 141 135 - 145 mmol/L   Potassium 4.1 3.5 - 5.1 mmol/L   Chloride 106 98 - 111 mmol/L   CO2 32 22 - 32 mmol/L   Glucose, Bld 95 70 - 99 mg/dL   BUN 14 8 - 23 mg/dL   Creatinine 0.90 0.61 - 1.24 mg/dL   Calcium 9.7 8.9 - 10.3 mg/dL   Total Protein 7.1 6.5 - 8.1 g/dL   Albumin 4.4 3.5 - 5.0 g/dL   AST 22 15 - 41 U/L   ALT 16 0 - 44 U/L   Alkaline Phosphatase 78 38 - 126 U/L   Total Bilirubin 0.6 0.3 - 1.2 mg/dL   GFR, Estimated >60 >60 mL/min   Anion gap 3 (L) 5 - 15  CBC with Differential (Cancer Center Only)   Collection Time: 12/25/21 11:01 AM  Result Value Ref Range   WBC Count 3.9 (L) 4.0 -  10.5 K/uL   RBC 4.70 4.22 - 5.81 MIL/uL   Hemoglobin 14.4 13.0 - 17.0 g/dL   HCT 42.8 39.0 - 52.0 %   MCV 91.1 80.0 - 100.0 fL  MCH 30.6 26.0 - 34.0 pg   MCHC 33.6 30.0 - 36.0 g/dL   RDW 12.7 11.5 - 15.5 %   Platelet Count 192 150 - 400 K/uL   nRBC 0.0 0.0 - 0.2 %   Neutrophils Relative % 65 %   Neutro Abs 2.6 1.7 - 7.7 K/uL   Lymphocytes Relative 23 %   Lymphs Abs 0.9 0.7 - 4.0 K/uL   Monocytes Relative 10 %   Monocytes Absolute 0.4 0.1 - 1.0 K/uL   Eosinophils Relative 1 %   Eosinophils Absolute 0.1 0.0 - 0.5 K/uL   Basophils Relative 0 %   Basophils Absolute 0.0 0.0 - 0.1 K/uL   Immature Granulocytes 1 %   Abs Immature Granulocytes 0.02 0.00 - 0.07 K/uL    Imaging  Brain MRI 12/09/18: Chronic left cerebellar hemorrhage or hemangioma   Personal Strengths Active sense of humor  Average or above average intelligence  Capable of independent living  Tax inspector fund of knowledge  Physical Health  Supportive family/friends   Support System Strengths Family and [place] aide   Advanced Directives Designated HC POA: [name] (sister) Code Status: per [facility] POA Advance Directives: per [facility] POA Advance Directive and HC POA documents in [place] and Plan: Please see individual consultation notes from physical therapy, pharmacy and social work for today.    Problem List Items Addressed This Visit       High   Mild neurocognitive disorder of unclear etiology    Relevant Medications   mirtazapine (REMERON) 7.5 MG tablet   Other Relevant Orders   TSH (Completed)   Vitamin B12 (Completed)   Dementia ([place])    Established problem: followed by [facility].   There is no clear behavioral and psychological symptoms of dementia other than possibly a non-specific anxiety and sleep difficulties.   While he does perseverate on some ideas, his voice loud with tendency to impulsively disrupt another in conversation, I do not feel confident in making a diagnosis of Adult AD/HD at this patient's age and I am not clear on the benefit of making such a diagnosis as these behaviors appear to be distressing to others more than to him.   I encouraged Mr Payer to continue seeing his therapist. I encouraged him to follow up with [facility] with consideration of repeating his neuropsychological testing to see if there is evidence of less common causes of dementia, like frontotemporal dementia.   He is continuing his donepezil 5 mg daily and the newly prescribed namenda 5 mg daily.   I am recommending a trial of mirtazapine 7.5 mg at bedtime to help with sleep and appetite.  I will see him back in 4 weeks to see how he is tolerating this new medication.   His sister is requesting referral to psychiatrist to evaluate him for ADHD.  A referral was placed to Dr Rachael Darby (Psych).  Checking TSH, Vit B12.       Relevant Medications   mirtazapine (REMERON) 7.5 MG tablet   Other Relevant Orders   Ambulatory referral to Psychiatry     Medium    Gastroesophageal reflux   Relevant Medications   dicyclomine (BENTYL) 20 MG tablet   methylcellulose (CITRUCEL) oral powder   famotidine (PEPCID) 20 MG tablet     Low   Balance problem    Failed 3 step tandem test. Patient does feel his balance is "off." He has a history of falls with injury.  Will discuss at follow up taking part in outpt PT        Unprioritized   Low TSH level    New finding. Possibly have  some relation to behavioral concern if patient has hyperthyroidism.  His sister asks that follow-up free T4 level be drawn at Dr Darien Ramus office. Patient will see him at the end of the month for a check-up.   I have sent a letter via fax to Dr Darien Ramus office with this request to draw the free T4.       Other Visit Diagnoses     Mood changes    -  Primary   Relevant Medications   mirtazapine (REMERON) 7.5 MG tablet   Other Relevant Orders   TSH (Completed)   Vitamin B12 (Completed)   Ambulatory referral to Psychiatry   Sleep disturbance       Relevant Medications   mirtazapine (REMERON) 7.5 MG tablet      Dementia ([place]) Established problem: followed by [facility].   There is no clear behavioral and psychological symptoms of dementia other than possibly a non-specific anxiety and sleep difficulties.   While he does perseverate on some ideas, his voice loud with tendency to impulsively disrupt another in conversation, I do not feel confident in making a diagnosis of Adult AD/HD at this patient's age and I am not clear on the benefit of making such a diagnosis as these behaviors appear to be distressing to others more than to him.   I encouraged Mr Gunawan to continue seeing his therapist. I encouraged him to follow up with [facility] with consideration of repeating his neuropsychological testing to see if there is evidence of less common causes of dementia, like frontotemporal dementia.   He is continuing his donepezil 5 mg daily and the newly prescribed namenda 5 mg daily.   I am recommending a trial of mirtazapine 7.5 mg at bedtime to help with sleep and appetite.  I will see him back in 4 weeks to see how he is tolerating this new medication.   His sister is requesting referral to psychiatrist to evaluate him for ADHD.  A referral was placed to Dr Rachael Darby (Psych).  Checking TSH, Vit B12.   Balance problem Failed 3 step tandem test. Patient does feel his balance  is "off." He has a history of falls with injury.  Will discuss at follow up taking part in outpt PT  Low TSH level New finding. Possibly have some relation to behavioral concern if patient has hyperthyroidism.  His sister asks that follow-up free T4 level be drawn at Dr Darien Ramus office. Patient will see him at the end of the month for a check-up.   I have sent a letter via fax to Dr Darien Ramus office with this request to draw the free T4.    Primary Contact: Extended Emergency Contact Information Primary Emergency Contact: Banks,[name] Address: [address]          [place], [place] [address] [place] Phone: [phone] Mobile Phone: [phone] Relation: Sister Secondary Emergency Contact: Lyanne Co Home Phone: [phone] Mobile Phone: [phone] Relation: Sister .

## 2021-12-15 NOTE — Assessment & Plan Note (Addendum)
Established problem: followed by [name].   There is no clear behavioral and psychological symptoms of dementia other than possibly a non-specific anxiety and sleep difficulties.   While he does perseverate on some ideas, his voice loud with tendency to impulsively disrupt another in conversation, I do not feel confident in making a diagnosis of Adult AD/HD at this patient's age and I am not clear on the benefit of making such a diagnosis as these behaviors appear to be distressing to others more than to him.   I encouraged [facility] [name] to continue seeing his therapist. I encouraged him to follow up with [facility] with consideration of repeating his neuropsychological testing to see if there is evidence of less common causes of dementia, like frontotemporal dementia.   He is continuing his donepezil 5 mg daily and the newly prescribed namenda 5 mg daily.   I am recommending a trial of mirtazapine 7.5 mg at bedtime to help with sleep and appetite.  I will see him back in 4 weeks to see how he is tolerating this new medication.   His sister is requesting referral to psychiatrist to evaluate him for ADHD.  A referral was placed to Dr Rachael Darby (Psych).  Checking TSH, Vit B12.

## 2021-12-18 DIAGNOSIS — R195 Other fecal abnormalities: Secondary | ICD-10-CM | POA: Diagnosis not present

## 2021-12-18 DIAGNOSIS — R140 Abdominal distension (gaseous): Secondary | ICD-10-CM | POA: Diagnosis not present

## 2021-12-21 ENCOUNTER — Telehealth: Payer: Self-pay

## 2021-12-21 NOTE — Telephone Encounter (Signed)
Patient's sister, [name], calls nurse line. She reports that she had a missed call from provider. Patient had lab work done last week and was not sure if the missed call was regarding these labs.   Will forward to Dr. McDiarmid. Please return call to patient's sister at [phone].  Talbot Grumbling, RN

## 2021-12-22 ENCOUNTER — Encounter: Payer: Self-pay | Admitting: Family Medicine

## 2021-12-22 ENCOUNTER — Telehealth: Payer: Self-pay | Admitting: Family Medicine

## 2021-12-22 ENCOUNTER — Other Ambulatory Visit: Payer: Self-pay

## 2021-12-22 DIAGNOSIS — H35373 Puckering of macula, bilateral: Secondary | ICD-10-CM | POA: Insufficient documentation

## 2021-12-22 DIAGNOSIS — H25811 Combined forms of age-related cataract, right eye: Secondary | ICD-10-CM | POA: Insufficient documentation

## 2021-12-22 DIAGNOSIS — C20 Malignant neoplasm of rectum: Secondary | ICD-10-CM

## 2021-12-22 DIAGNOSIS — H43813 Vitreous degeneration, bilateral: Secondary | ICD-10-CM | POA: Insufficient documentation

## 2021-12-22 DIAGNOSIS — H25812 Combined forms of age-related cataract, left eye: Secondary | ICD-10-CM | POA: Insufficient documentation

## 2021-12-22 DIAGNOSIS — H26493 Other secondary cataract, bilateral: Secondary | ICD-10-CM | POA: Insufficient documentation

## 2021-12-22 HISTORY — DX: Combined forms of age-related cataract, right eye: H25.811

## 2021-12-22 HISTORY — DX: Other secondary cataract, bilateral: H26.493

## 2021-12-22 HISTORY — DX: Puckering of macula, bilateral: H35.373

## 2021-12-22 HISTORY — DX: Vitreous degeneration, bilateral: H43.813

## 2021-12-22 HISTORY — DX: Combined forms of age-related cataract, left eye: H25.812

## 2021-12-22 NOTE — Telephone Encounter (Signed)
I

## 2021-12-22 NOTE — Telephone Encounter (Signed)
I spoke with Carlos [name] about his TSH results.  He was not certain what I was talking about, so he asked I call his sister Carlos [name] about the results.

## 2021-12-24 NOTE — Assessment & Plan Note (Signed)
-  initially diagnosed in 05/2019 with locally advanced stage III rectal cancer and treated with total neoadjuvant chemotherapy FOLFOX 07/02/19-10/08/19 and concurrent chemoRT with Xeloda '1500mg'$  BID  11/02/19-12/07/19. -He underwent low anterior resection surgery with Dr Morton Stall at [facility] on 02/08/20. -surveillance colonoscopy with Dr. Therisa Doyne in 09/2020 was benign -CT CAP 02/20/21 showed NED.

## 2021-12-25 ENCOUNTER — Encounter: Payer: Self-pay | Admitting: Hematology

## 2021-12-25 ENCOUNTER — Encounter: Payer: Self-pay | Admitting: Family Medicine

## 2021-12-25 ENCOUNTER — Other Ambulatory Visit: Payer: Self-pay

## 2021-12-25 ENCOUNTER — Inpatient Hospital Stay (HOSPITAL_BASED_OUTPATIENT_CLINIC_OR_DEPARTMENT_OTHER): Payer: Medicare Other | Admitting: Hematology

## 2021-12-25 ENCOUNTER — Inpatient Hospital Stay: Payer: Medicare Other | Attending: Hematology

## 2021-12-25 VITALS — BP 133/69 | HR 65 | Temp 97.7°F | Resp 16 | Ht 64.0 in | Wt 133.2 lb

## 2021-12-25 DIAGNOSIS — C20 Malignant neoplasm of rectum: Secondary | ICD-10-CM | POA: Diagnosis not present

## 2021-12-25 DIAGNOSIS — G3184 Mild cognitive impairment, so stated: Secondary | ICD-10-CM | POA: Diagnosis not present

## 2021-12-25 DIAGNOSIS — R7989 Other specified abnormal findings of blood chemistry: Secondary | ICD-10-CM | POA: Insufficient documentation

## 2021-12-25 HISTORY — DX: Other specified abnormal findings of blood chemistry: R79.89

## 2021-12-25 LAB — CBC WITH DIFFERENTIAL (CANCER CENTER ONLY)
Abs Immature Granulocytes: 0.02 10*3/uL (ref 0.00–0.07)
Basophils Absolute: 0 10*3/uL (ref 0.0–0.1)
Basophils Relative: 0 %
Eosinophils Absolute: 0.1 10*3/uL (ref 0.0–0.5)
Eosinophils Relative: 1 %
HCT: 42.8 % (ref 39.0–52.0)
Hemoglobin: 14.4 g/dL (ref 13.0–17.0)
Immature Granulocytes: 1 %
Lymphocytes Relative: 23 %
Lymphs Abs: 0.9 10*3/uL (ref 0.7–4.0)
MCH: 30.6 pg (ref 26.0–34.0)
MCHC: 33.6 g/dL (ref 30.0–36.0)
MCV: 91.1 fL (ref 80.0–100.0)
Monocytes Absolute: 0.4 10*3/uL (ref 0.1–1.0)
Monocytes Relative: 10 %
Neutro Abs: 2.6 10*3/uL (ref 1.7–7.7)
Neutrophils Relative %: 65 %
Platelet Count: 192 10*3/uL (ref 150–400)
RBC: 4.7 MIL/uL (ref 4.22–5.81)
RDW: 12.7 % (ref 11.5–15.5)
WBC Count: 3.9 10*3/uL — ABNORMAL LOW (ref 4.0–10.5)
nRBC: 0 % (ref 0.0–0.2)

## 2021-12-25 LAB — CMP (CANCER CENTER ONLY)
ALT: 16 U/L (ref 0–44)
AST: 22 U/L (ref 15–41)
Albumin: 4.4 g/dL (ref 3.5–5.0)
Alkaline Phosphatase: 78 U/L (ref 38–126)
Anion gap: 3 — ABNORMAL LOW (ref 5–15)
BUN: 14 mg/dL (ref 8–23)
CO2: 32 mmol/L (ref 22–32)
Calcium: 9.7 mg/dL (ref 8.9–10.3)
Chloride: 106 mmol/L (ref 98–111)
Creatinine: 0.9 mg/dL (ref 0.61–1.24)
GFR, Estimated: >60 mL/min (ref >60)
Glucose, Bld: 95 mg/dL (ref 70–99)
Potassium: 4.1 mmol/L (ref 3.5–5.1)
Sodium: 141 mmol/L (ref 135–145)
Total Bilirubin: 0.6 mg/dL (ref 0.3–1.2)
Total Protein: 7.1 g/dL (ref 6.5–8.1)

## 2021-12-25 NOTE — Progress Notes (Signed)
Letter requesting Dr Darien Ramus office check the Free T4 serum level at Carlos [name]'s next office visit at the end of this month.

## 2021-12-25 NOTE — Assessment & Plan Note (Signed)
New finding. Possibly have some relation to behavioral concern if patient has hyperthyroidism.  His sister asks that follow-up free T4 level be drawn at Dr Darien Ramus office. Patient will see him at the end of the month for a check-up.   I have sent a letter via fax to Dr Darien Ramus office with this request to draw the free T4.

## 2021-12-25 NOTE — Progress Notes (Signed)
[place]   Telephone:([phone] Fax:(336) [phone]   Clinic Follow up Note   Patient Care Team: [name], MD as PCP - General (Family Medicine) Cameron Sprang, MD as Consulting Physician (Neurology) Truitt Merle, MD as Consulting Physician (Hematology) Ronnette Juniper, MD as Consulting Physician (Gastroenterology) Elease Hashimoto (Neurology) Hester Mates, OD as Referring Physician (Optometry)  Date of Service:  12/25/2021  CHIEF COMPLAINT: f/u of rectal cancer   {CURRENT THERAPY: Surveillance   ASSESSMENT:  Carlos [name] is a 76 y.o. 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 -He underwent low anterior resection surgery with Dr Morton Stall at [facility] on 02/08/20. -surveillance colonoscopy with Dr. Therisa Doyne in 09/2020 was benign -CT CAP 02/20/21 showed NED.  -He is clinically doing well, lab reviewed, exam was unremarkable, no clinical concern for recurrence.   PLAN: Schedule scan same day as lab.F/U in 3 mths with me after lab and CT.   SUMMARY OF ONCOLOGIC HISTORY: Oncology History Overview Note  Cancer Staging Rectal adenocarcinoma [facility]) Staging form: Colon and Rectum, AJCC 8th Edition - Clinical stage from 06/12/2019: cT4, cN1 - Unsigned Stage prefix: Initial diagnosis - Pathologic stage from 02/08/2020: Stage I (ypT1, pN0, cM0) - Signed by Truitt Merle, MD on 04/06/2020 Stage prefix: Post-therapy Total positive nodes: 0 Residual tumor (R): R0 - None    Adenocarcinoma of rectum ([place])  05/29/2019 Procedure   Colonoscopy per Dr. Therisa Doyne A digital rectal exam was normal  frond-like fungating infiltrative non-obstructing large mass in the rectum. The mass was partially circumferential, measured 5 cm in length. The colonoscopy also showed 11 sessile polyps in  the transverse and asecnding colon and hepatic flexure.    05/29/2019 Initial Biopsy   Pathology on the rectal biopsy showed at least intramucosal adenocarcinoma involving tubulovillous adenoma with high grade dysplasia.   06/05/2019 Imaging   CT CAP IMPRESSION: 1. Cholelithiasis and gallbladder wall thickening with equivocal pericholecystic inflammation. Acute cholecystitis not excluded. If there is clinical suspicion for acute cholecystitis, recommend ultrasound evaluation. 2. Approximately 5 cm irregular mass within the distal sigmoid colon with possible extension through the wall. No definite abnormal adjacent lymph nodes. 3. Mild omental haziness-nonspecific. Metastatic disease not excluded. 4. Indeterminate 3-4 mm RIGHT pulmonary nodules which could represent metastatic disease. These are too small for PET CT evaluation. Consider CT follow-up. 5. 2.5 cm irregular cystic mass within the RIGHT kidney. Elective MRI recommended for further evaluation. 6. 4 mm nonobstructing LEFT LOWER pole renal calculus. 7. Coronary artery disease. 8. Aortic Atherosclerosis (ICD10-I70.0).   06/12/2019 Imaging   Staging MRI pelvis  FINDINGS: TUMOR LOCATION Tumor distance from Anal Verge/Skin Surface:  11.4 cm Tumor distance to Internal Anal Sphincter: 6.5 cm TUMOR DESCRIPTION Circumferential Extent: 100% Tumor Length: 8.3 cm T - CATEGORY Extension through Muscularis Propria: Yes>64m=T3d Shortest Distance of any tumor/node from Mesorectal Fascia: 0 mm, along the left lateral and posterior walls (tumor abuts the left lateral pelvic sidewall and sacrum) Extramural Vascular Invasion/Tumor Thrombus: No Invasion of Anterior Peritoneal Reflection: No Involvement of Adjacent Organs or Pelvic Sidewall: Yes, involving the left lateral pelvic sidewall =T4 Levator Ani Involvement: No N - CATEGORY Mesorectal Lymph Nodes >=562m 2=N1 Extra-mesorectal Lymphadenopathy: No Other:   None. IMPRESSION: Rectal adenocarcinoma T stage: T4 Rectal adenocarcinoma N stage:  N1 Distance from tumor to the internal anal  sphincter is 6.5 cm.   06/18/2019 Initial Diagnosis   Rectal adenocarcinoma ([place])   07/02/2019 - 10/08/2019 Chemotherapy   Total Neoadjuvant FOLFOX q2weeks starting 07/02/19-10/08/19,  8 cycles   07/13/2019 PET scan   IMPRESSION: 1. Known rectal mass is intensely hypermetabolic compatible with primary rectal adenocarcinoma. No findings of FDG avid nodal metastasis or distant metastatic disease. 2. Hyperdense kidney cysts are identified bilaterally favored to represent hemorrhagic cyst. 3. Nonobstructing left renal calculi. 4. Aortic atherosclerosis, in addition to lad coronary artery disease. Please note that although the presence of coronary artery calcium documents the presence of coronary artery disease, the severity of this disease and any potential stenosis cannot be assessed on this non-gated CT examination. Assessment for potential risk factor modification, dietary therapy or pharmacologic therapy may be warranted, if clinically indicated. Aortic Atherosclerosis (ICD10-I70.0). 5. Gallstones.   09/11/2019 - 09/14/2019 Hospital Admission   Acute AMS, seizure 09/11/19 -Developed acute altered mental status day after Cycle 6 chemo (09/11/19).  -He was admitted for work up, EEG showed epileptic spike. ID work up negative, MRI negative for metastasis.    09/11/2019 Imaging   MRI Brain  IMPRESSION: Incomplete study. Images obtained reveal no acute abnormality. Negative for acute infarct.   Atrophy and mild ventricular enlargement stable from prior studies.   09/11/2019 Imaging   CT head  IMPRESSION: Mild ventricular prominence with normal appearing sulci. Question early communicating hydrocephalus. Brain parenchyma appears unremarkable. No acute infarct. No mass or hemorrhage.   Foci of arterial vascular calcification noted. There is mucosal thickening in  several ethmoid air cells.   There is probable cerumen in the right external auditory canal.     09/23/2019 Imaging   CT AP  IMPRESSION: 1. Solid-appearing left eccentric upper rectal mass measures about 6.7 by 2.9 cm. This is difficult to compare directly to the prior exam given differences in orientation and degree of distension of the rectum. There is some prominence of stool in the sigmoid colon and rectum on today's examination, but the mass is not obstructive. 2. Other imaging findings of potential clinical significance: Cholelithiasis. Complex bilateral renal lesions are similar to prior exams. Nonobstructive left nephrolithiasis. Mild prostatomegaly with nodular prominence the upper margin of the prostate gland. Lumbar spondylosis and degenerative disc disease causing multilevel foraminal impingement. 3. Aortic atherosclerosis.   Aortic Atherosclerosis (ICD10-I70.0).   11/02/2019 - 12/09/2019 Chemotherapy   Concurrent chemo RT with Oral Xeloda '1500mg'$  BID on days of RT starting 11/02/19-12/09/19   11/02/2019 - 12/09/2019 Radiation Therapy   Concurrent chemo RT by Dr Lisbeth Renshaw with Xeloda  starting 11/02/19-12/09/19   01/13/2020 Imaging   MRI at [facility] 1.  Reduced size of the previously demonstrated high rectal mass. The residual tumor measures 2.4 cm in maximal diameter and is primarily endoluminal. There is a similar amount of extramural disease spread into the left mesorectal space and presacral space.  2.  Improved appearance of lymphadenopathy with several conspicuous mesorectal and internal iliac lymph nodes.  3.  Small volume of nonspecific free fluid in the pelvis.   01/29/2020 Imaging   CT CAP  IMPRESSION: 1. Interval response to therapy of rectosigmoid junction primary. 2. No findings of metastatic disease within the chest, abdomen, or pelvis. 3. Small pulmonary nodules, similar and decreased as detailed above. Nonspecific. 4. Right-sided Port-A-Cath tip at high IVC.  consider repositioning. 5. Cholelithiasis. 6. Left nephrolithiasis. 7. Bilateral renal lesions. A medial upper pole renal lesion is suspicious for renal cell carcinoma. This could be re-evaluated at  follow-up or more entirely characterized with routine outpatient pre and post contrast abdominal MRI.   02/08/2020 Surgery   LOWER ANTERIOR RESECTION RECTUM LAPAROSCOPIC by Dr Morton Stall at [facility]   Procedures   PR LAP,SURG,COLECTOMY, PARTIAL, W/ANAST   PR LAP, SURG MOBIL SPLENIC FL DUR PTL COLECTOMY   PR LAP, SURG ILEO/JEJUNO-STOMY   SIGMOID COLECTOMY LAPAROSCOPIC ASSISTED     02/08/2020 Pathology Results   Final Pathologic Diagnosis at [place], "IMA", EXCISION:              Two lymph nodes are negative for metastatic carcinoma (0/2).   B.  SIGMOID COLON AND RECTUM, LOWER ANTERIOR RESECTION: Residual well differentiated invasive adenocarcinoma of the rectum, arising from a tubulovillous adenoma with high grade dyplasia.               Size: 2 mm in greatest dimension.  Tumor invades the submucosa.  Negative for perineural and lymphovascular invasion. Resection margins are negative for malignancy or dysplasia.  Twenty-two lymph nodes are negative for metastatic carcinoma (0/22). Complete mesorectum.               See CAP cancer protocol summary and comment.   C.  PROXIMAL DOUGHNUT, RESECTION:                      Segment of benign colonic tissue.              Negative for malignancy or dyplasia.    D.  DISTAL DOUGHNUT, RESECTION:                            Segment of benign colonic tissue.              Negative for malignancy or dyplasia.    PATHOLOGIC STAGE CLASSIFICATION (pTNM, AJCC 8th Edition)         TNM Descriptors:    y (post-treatment)     Primary Tumor (pT):    pT1     Regional Lymph Nodes (pN):    pN0      02/08/2020 Cancer Staging   Staging form: Colon and Rectum, AJCC 8th Edition - Pathologic stage from 02/08/2020: Stage I (ypT1, pN0, cM0) -  Signed by Truitt Merle, MD on 04/06/2020 Stage prefix: Post-therapy Total positive nodes: 0 Residual tumor (R): R0 - None   07/05/2020 Imaging   CT C/A/P w/o contrast  IMPRESSION: 1. Mildly indistinct appearance of the fat and gallbladder wall separation in the upper abdomen, of uncertain significance and in the setting of abundant gallstones in the gallbladder lumen. Correlate with any clinical signs of abdominal pain and consider further evaluation with ultrasound as warranted. 2. RIGHT-sided Port-A-Cath tip at the high IVC near the diaphragmatic hiatus. Could consider repositioning as warranted. 3. 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 Consider follow-up evaluation, renal protocol study with with MRI with and without contrast as outlined in previous imaging. Hemorrhagic and/or proteinaceous cysts elsewhere in the kidneys. 4. LEFT nephrolithiasis as before with lower pole calculus approximately 6 mm. 5. No signs of metastatic disease with abundant stool in the colon following partial colonic resection. Correlate with any signs of constipation. 6. 4 mm nodule in the superior segment of the RIGHT lower lobe is unchanged. Attention on follow-up 7. Small midline periumbilical hernia containing fat. 8. Aortic atherosclerosis.      INTERVAL HISTORY:  Purcell Nails  W Elza is here for a follow up of  rectal cancer.  He was last seen by me on 06/28/21. He presents to the clinic accompanied by his sister.Pt states he has had an upset stomach along with gas.He's much better.He's has help with light house cleaning and cooking. Pt is still working 40 hrs a wk. B12 level was normal.   All other systems were reviewed with the patient and are negative.  MEDICAL HISTORY:  Past Medical History:  Diagnosis Date   Acute blood loss anemia    Acute encephalopathy 09/11/2019   Adenocarcinoma of rectum ([place]) 06/18/2019   Anxiety    Benign prostatic hyperplasia 03/30/2013    10/1 IMO update   Bilateral posterior capsular opacification 12/22/2021   Breakthrough seizure ([place]) 12/08/2019   Closed comminuted intertrochanteric fracture of left femur ([place])    Combined forms of age-related cataract of left eye 12/22/2021   Combined forms of age-related cataract of right eye 12/22/2021   Hypoalbuminemia due to protein-calorie malnutrition (HCC)    Left hip pain    Macular puckering, 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/23/2019   Posterior vitreous detachment of both eyes 12/22/2021   Pressure ulcer, stage 2 ([place]) 12/12/2019   rectal ca dx'd 06/2016   Seizures ([place])     SURGICAL HISTORY: Past Surgical History:  Procedure Laterality Date   COLON SURGERY     HIP SURGERY     INTRAMEDULLARY (IM) NAIL INTERTROCHANTERIC Left 12/11/2019   Procedure: INTRAMEDULLARY (IM) NAIL INTERTROCHANTRIC;  Surgeon: Rod Can, MD;  Location: [facility];  Service: Orthopedics;  Laterality: Left;   IR REMOVAL TUN ACCESS W/ PORT W/O FL MOD SED  07/29/2020   PORTACATH PLACEMENT N/A 07/01/2019   Procedure: PORT ULTRASOUND GUIDED PLACEMENT;  Surgeon: Alphonsa Overall, MD;  Location: [facility];  Service: General;  Laterality: N/A;   PROSTATE SURGERY  2004   RECTAL SURGERY     TONSILECTOMY, ADENOIDECTOMY, BILATERAL MYRINGOTOMY AND TUBES      I have reviewed the social history and family history with the patient and they are unchanged from previous note.  ALLERGIES:  has No Known Allergies.  MEDICATIONS:  Current Outpatient Medications  Medication Sig Dispense Refill   dicyclomine (BENTYL) 20 MG tablet Take 20 mg by mouth 3 (three) times daily as needed.     donepezil (ARICEPT) 5 MG tablet Take half tablet 5 mg daily 30 tablet 3   famotidine (PEPCID) 20 MG tablet Take 1 tablet (20 mg total) by mouth 2 (two) times daily as needed. 90 tablet 0   levETIRAcetam (KEPPRA) 1000 MG tablet Take 1 tablet twice a day (take with Levetiracetam '250mg'$  tablet for total  of '1250mg'$  twice a day) 180 tablet 1   levETIRAcetam (KEPPRA) 250 MG tablet TAKE 5 TABLETS BY MOUTH TWICE DAILY 900 tablet 3   loperamide (IMODIUM) 2 MG capsule Take 1 capsule (2 mg total) by mouth every 6 (six) hours as needed for diarrhea or loose stools. 30 capsule 0   memantine (NAMENDA) 5 MG tablet Take 1 tablet (5 mg total) by mouth at bedtime. 30 tablet 11   methylcellulose (CITRUCEL) oral powder Take by mouth.     mirtazapine (REMERON) 7.5 MG tablet Take 1 tablet (7.5 mg total) by mouth at bedtime. For insomnia. 30 tablet 0   Multiple Vitamin (MULTIVITAMIN WITH MINERALS) TABS tablet Take 1 tablet by mouth daily.     NON FORMULARY Take 1 capsule by mouth daily. Mind works  No current facility-administered medications for this visit.    PHYSICAL EXAMINATION: ECOG PERFORMANCE STATUS: 1 - Symptomatic but completely ambulatory  Vitals:   12/25/21 1121  BP: 133/69  Pulse: 65  Resp: 16  Temp: 97.7 F (36.5 C)  SpO2: 99%   Wt Readings from Last 3 Encounters:  12/25/21 133 lb 3.2 oz (60.4 kg)  12/14/21 135 lb (61.2 kg)  12/07/21 136 lb (61.7 kg)     GENERAL:alert, no distress and comfortable SKIN: skin color, texture, turgor are normal, no rashes or significant lesions EYES: normal, Conjunctiva are pink and non-injected, sclera clear NECK: supple, thyroid normal size, non-tender, without nodularity LYMPH:  no palpable lymphadenopathy in the cervical, axillary LUNGS: clear to auscultation and percussion with normal breathing effort HEART: regular rate & rhythm and no murmurs and no lower extremity edema ABDOMEN:abdomen soft, non-tender and normal bowel sounds Musculoskeletal:no cyanosis of digits and no clubbing  NEURO: alert & oriented x 3 with fluent speech, no focal motor/sensory deficits  LABORATORY DATA:  I have reviewed the data as listed    Latest Ref Rng & Units 12/25/2021   11:01 AM 06/28/2021   10:32 AM 02/27/2021    9:13 AM  CBC  WBC 4.0 - 10.5 K/uL 3.9  4.1   6.7   Hemoglobin 13.0 - 17.0 g/dL 14.4  13.1  12.8   Hematocrit 39.0 - 52.0 % 42.8  39.3  39.2   Platelets 150 - 400 K/uL 192  219  195         Latest Ref Rng & Units 12/25/2021   11:01 AM 06/28/2021   10:32 AM 02/27/2021    9:13 AM  CMP  Glucose 70 - 99 mg/dL 95  127  117   BUN 8 - 23 mg/dL '14  18  11   '$ Creatinine 0.61 - 1.24 mg/dL 0.90  0.89  0.71   Sodium 135 - 145 mmol/L 141  142  139   Potassium 3.5 - 5.1 mmol/L 4.1  3.8  3.8   Chloride 98 - 111 mmol/L 106  106  106   CO2 22 - 32 mmol/L 32  30  25   Calcium 8.9 - 10.3 mg/dL 9.7  9.4  8.8   Total Protein 6.5 - 8.1 g/dL 7.1  7.1  7.2   Total Bilirubin 0.3 - 1.2 mg/dL 0.6  0.5  0.6   Alkaline Phos 38 - 126 U/L 78  86  101   AST 15 - 41 U/L '22  20  22   '$ ALT 0 - 44 U/L '16  14  16       '$ RADIOGRAPHIC STUDIES: I have personally reviewed the radiological images as listed and agreed with the findings in the report. No results found.    Orders Placed This Encounter  Procedures   CT CHEST ABDOMEN PELVIS W CONTRAST    Standing Status:   Future    Standing Expiration Date:   12/26/2022    Order Specific Question:   Preferred imaging location?    Answer:   [facility]    Order Specific Question:   Release to patient    Answer:   Immediate    Order Specific Question:   Is Oral Contrast requested for this exam?    Answer:   Yes, Per Radiology protocol   All questions were answered. The patient knows to call the clinic with any problems, questions or concerns. No barriers to learning was detected. The total time spent in  the appointment was 30 minutes.     Truitt Merle, MD 12/25/2021   Felicity Coyer, CMA, am acting as scribe for Truitt Merle, MD.   I have reviewed the above documentation for accuracy and completeness, and I agree with the above.

## 2021-12-26 ENCOUNTER — Telehealth: Payer: Self-pay | Admitting: Hematology

## 2021-12-26 NOTE — Telephone Encounter (Signed)
Called patient to notify of new upcoming appointments. Left voicemail

## 2022-01-01 DIAGNOSIS — D630 Anemia in neoplastic disease: Secondary | ICD-10-CM | POA: Diagnosis not present

## 2022-01-01 DIAGNOSIS — R7309 Other abnormal glucose: Secondary | ICD-10-CM | POA: Diagnosis not present

## 2022-01-01 DIAGNOSIS — E785 Hyperlipidemia, unspecified: Secondary | ICD-10-CM | POA: Diagnosis not present

## 2022-01-01 DIAGNOSIS — Z0000 Encounter for general adult medical examination without abnormal findings: Secondary | ICD-10-CM | POA: Diagnosis not present

## 2022-01-01 DIAGNOSIS — C61 Malignant neoplasm of prostate: Secondary | ICD-10-CM | POA: Diagnosis not present

## 2022-01-09 ENCOUNTER — Telehealth: Payer: Self-pay

## 2022-01-09 NOTE — Telephone Encounter (Signed)
Let caller know a letter was sent to patient's PCP.

## 2022-01-09 NOTE — Telephone Encounter (Signed)
Patient's sister returns call to nurse line. She was asking if Dr. McDiarmid had been able to speak with patient's PCP regarding patient's care plan.   Patient has upcoming appointment with Dr. Wendy Poet on 01/11/22.  Will forward to Dr. McDiarmid.   Talbot Grumbling, RN

## 2022-01-11 ENCOUNTER — Other Ambulatory Visit: Payer: Self-pay | Admitting: Family Medicine

## 2022-01-11 ENCOUNTER — Ambulatory Visit: Payer: Medicare Other | Admitting: Family Medicine

## 2022-01-11 DIAGNOSIS — G3184 Mild cognitive impairment, so stated: Secondary | ICD-10-CM

## 2022-01-11 DIAGNOSIS — R4586 Emotional lability: Secondary | ICD-10-CM

## 2022-01-11 DIAGNOSIS — G479 Sleep disorder, unspecified: Secondary | ICD-10-CM

## 2022-01-12 ENCOUNTER — Encounter: Payer: Self-pay | Admitting: Family Medicine

## 2022-01-12 ENCOUNTER — Ambulatory Visit (INDEPENDENT_AMBULATORY_CARE_PROVIDER_SITE_OTHER): Payer: Medicare Other | Admitting: Family Medicine

## 2022-01-12 ENCOUNTER — Other Ambulatory Visit: Payer: Self-pay

## 2022-01-12 VITALS — BP 147/86 | HR 87 | Wt 137.6 lb

## 2022-01-12 DIAGNOSIS — R4586 Emotional lability: Secondary | ICD-10-CM | POA: Diagnosis not present

## 2022-01-12 DIAGNOSIS — G479 Sleep disorder, unspecified: Secondary | ICD-10-CM | POA: Diagnosis not present

## 2022-01-12 DIAGNOSIS — F03A18 Unspecified dementia, mild, with other behavioral disturbance: Secondary | ICD-10-CM | POA: Diagnosis not present

## 2022-01-12 DIAGNOSIS — G3184 Mild cognitive impairment, so stated: Secondary | ICD-10-CM

## 2022-01-12 MED ORDER — MIRTAZAPINE 15 MG PO TABS: ORAL_TABLET | ORAL | 2 refills | Status: DC

## 2022-01-12 NOTE — Telephone Encounter (Signed)
Returned call to [place], no answer. Patient has appointment today with Dr. Jeani Hawking.    Talbot Grumbling, RN

## 2022-01-12 NOTE — Progress Notes (Unsigned)
    SUBJECTIVE:   CHIEF COMPLAINT / HPI:   Anxiety, insomnia follow up Carlos [name] is here with his HCPOA for follow up. He was seen in our geriatrics clinic 12/14/21 for cognitive evaluation. At that time, insomnia and anxiety seemed to be aversely affecting him. Mirtazapine 7.5 mg QHS was started.   Today, he reports waking up fewer numbers of times overnight. He is still waking up well without feeling groggy. HCPOA mentions she has not seen any effect on his anxious affect. She is wondering if there is any dose adjustment or other medication we could do for the anxious temperament.   PERTINENT  PMH / PSH: Dementia, epilepsy, rectal adenocarcinoma, GERD  OBJECTIVE:   BP (!) 147/86   Pulse 87   Wt 137 lb 9.6 oz (62.4 kg)   SpO2 99%   BMI 23.62 kg/m    PHQ-9:     01/12/2022    1:52 PM 12/14/2021    2:29 PM 02/01/2020   11:29 AM  Depression screen PHQ 2/9  Decreased Interest 2 0 1  Down, Depressed, Hopeless 0 0 0  PHQ - 2 Score 2 0 1  Altered sleeping 2 0 0  Tired, decreased energy 0 0 0  Change in appetite 0 1 0  Feeling bad or failure about yourself  2 0 0  Trouble concentrating 2 0 1  Moving slowly or fidgety/restless 0 0 0  Suicidal thoughts 0 0 0  PHQ-9 Score '8 1 2  '$ Difficult doing work/chores Somewhat difficult  Not difficult at all    GAD-7:     12/15/2021    7:48 AM 12/14/2021    3:00 PM  GAD 7 : Generalized Anxiety Score  Nervous, Anxious, on Edge 2 2  Control/stop worrying 0 0  Worry too much - different things 0 0  Trouble relaxing 1 0  Restless 0 2  Easily annoyed or irritable 0 1  Afraid - awful might happen 0 0  Total GAD 7 Score 3 5  Anxiety Difficulty Not difficult at all    Physical Exam General: Awake, alert, oriented, no acute distress Respiratory: Unlabored respirations, speaking in full sentences, no respiratory distress Extremities: Moving all extremities spontaneously Psych: perseverative ideations mainly involving personal productivity,  redirectable  ASSESSMENT/PLAN:   Dementia (HCC) Mirtazapine helping some with sleep. Will increase from 7.5 mg to 15 mg. Precautions include morning grogginess and any clouding of mentation. HCPOA in agreement. Rx sent. Patient to return in 3-4 weeks with myself or Dr. Wendy Poet.      Ezequiel Essex, MD [place]

## 2022-01-12 NOTE — Patient Instructions (Signed)
It was wonderful to see you today. Thank you for allowing me to be a part of your care. Below is a short summary of what we discussed at your visit today:  Sleep troubles, anxiety We will increase the mirtazapine from 7.5 mg to 15 mg. Please pick this up from the pharmacy.  Please come back in about 3 weeks with either myself or Dr. McDiarmid to see how this is affecting you.  If you have concern about being too groggy in the morning, please call us.     Please bring all of your medications to every appointment!  If you have any questions or concerns, please do not hesitate to contact us via phone or MyChart message.   Ezequiel Essex, MD

## 2022-01-14 NOTE — Assessment & Plan Note (Signed)
Mirtazapine helping some with sleep. Will increase from 7.5 mg to 15 mg. Precautions include morning grogginess and any clouding of mentation. HCPOA in agreement. Rx sent. Patient to return in 3-4 weeks with myself or Dr. Wendy Poet.

## 2022-01-18 DIAGNOSIS — F419 Anxiety disorder, unspecified: Secondary | ICD-10-CM | POA: Diagnosis not present

## 2022-01-18 DIAGNOSIS — R296 Repeated falls: Secondary | ICD-10-CM | POA: Diagnosis not present

## 2022-01-18 DIAGNOSIS — I700 Atherosclerosis of aorta: Secondary | ICD-10-CM | POA: Diagnosis not present

## 2022-01-18 DIAGNOSIS — Z85048 Personal history of other malignant neoplasm of rectum, rectosigmoid junction, and anus: Secondary | ICD-10-CM | POA: Diagnosis not present

## 2022-01-18 DIAGNOSIS — E119 Type 2 diabetes mellitus without complications: Secondary | ICD-10-CM | POA: Diagnosis not present

## 2022-01-18 DIAGNOSIS — E785 Hyperlipidemia, unspecified: Secondary | ICD-10-CM | POA: Diagnosis not present

## 2022-01-18 DIAGNOSIS — C61 Malignant neoplasm of prostate: Secondary | ICD-10-CM | POA: Diagnosis not present

## 2022-01-18 DIAGNOSIS — Z0000 Encounter for general adult medical examination without abnormal findings: Secondary | ICD-10-CM | POA: Diagnosis not present

## 2022-01-18 DIAGNOSIS — G40909 Epilepsy, unspecified, not intractable, without status epilepticus: Secondary | ICD-10-CM | POA: Diagnosis not present

## 2022-02-05 ENCOUNTER — Encounter: Payer: Self-pay | Admitting: Hematology

## 2022-02-08 ENCOUNTER — Other Ambulatory Visit: Payer: Self-pay | Admitting: Family Medicine

## 2022-02-08 DIAGNOSIS — G3184 Mild cognitive impairment, so stated: Secondary | ICD-10-CM

## 2022-02-08 DIAGNOSIS — R4586 Emotional lability: Secondary | ICD-10-CM

## 2022-02-08 DIAGNOSIS — G479 Sleep disorder, unspecified: Secondary | ICD-10-CM

## 2022-02-08 MED ORDER — MIRTAZAPINE 15 MG PO TABS: ORAL_TABLET | ORAL | 2 refills | Status: AC

## 2022-02-08 NOTE — Telephone Encounter (Signed)
Medication request is for a lower dose than discussed at the last visit. Called patient's sister to clarify dosing and she will call the office back. Once that has been verified, I will send in the appropriate dose.   Jdyn Parkerson, DO

## 2022-02-12 ENCOUNTER — Ambulatory Visit (INDEPENDENT_AMBULATORY_CARE_PROVIDER_SITE_OTHER): Payer: Medicare HMO | Admitting: Family Medicine

## 2022-02-12 ENCOUNTER — Encounter: Payer: Self-pay | Admitting: Family Medicine

## 2022-02-12 VITALS — BP 122/64 | HR 78 | Ht 64.0 in | Wt 138.8 lb

## 2022-02-12 DIAGNOSIS — F03A18 Unspecified dementia, mild, with other behavioral disturbance: Secondary | ICD-10-CM | POA: Diagnosis not present

## 2022-02-12 NOTE — Assessment & Plan Note (Addendum)
Sleep disturbances greatly improved on mirtazapine 15 mg nightly.  Will continue with no changes.  In regards to anxiety, I encouraged sister to reach out to list of psychiatrists provided by PCP and find someone in their network to see.  I am unsure if the anxiety which mostly concerns family and friends is the type of anxiety that would respond to medication.  This may be more of a personality trait or disorder than anxiety.  The patient himself is not bothered by anxiety whatsoever.  I discussed turning care back over to his PCP and his new psychiatrist, as a medication we started in our geriatric clinic is now stable and effective.  Any future medication changes should be made by his PCP.  Patient and his sister voice understanding and are appreciative.

## 2022-02-12 NOTE — Patient Instructions (Signed)
It was wonderful to see you today. Thank you for allowing me to be a part of your care. Below is a short summary of what we discussed at your visit today:  Sleep disturbances I am very glad to hear that the mirtazapine 15 mg is helping you sleep well!  Continue on this dose.  No changes.  Anxiety Please continue to look at the psychiatrist list provided to you by your PCP and calling around to find someone to connect with.  At this time, since the mirtazapine is working well for your sleep, we can release you back to the care of her primary care doctor and the new psychiatrist that you find. Thank you for allowing [place] to care for you in our geriatrics clinic.    If you have any questions or concerns, please do not hesitate to contact us via phone or MyChart message.   Ezequiel Essex, MD

## 2022-02-12 NOTE — Progress Notes (Signed)
    SUBJECTIVE:   CHIEF COMPLAINT / HPI:   Insomnia follow-up Patient presents with his sister who is HCPOA for follow-up.  He was initially seen at the [facility] 11/9 and was started on mirtazapine 7.5 mg nightly to help with sleep and possibly anxiety.  At follow-up with myself 12/8, we discovered that the mirtazapine 7.5 mg dose was helping but both patient and sister desired a more efficacious dose.  Mirtazapine was prescribed at 15 mg.  Today, both patient and sister report that he is now sleeping through the night reliably.  He wakes up feeling rested and does not feel too groggy in the morning.  Does not believe he has any impairments in the morning.  He is still able to drive to work in the afternoon.  Sister does state that patient is still quite anxious and she wonders if there is anything to help with that.  We previously referred to a psychiatrist named Dr. Norma Fredrickson, who Dr. McDiarmid knows well.  Unfortunately, patient's insurance is changed and Dr. Judithann Graves is no longer in network.  PERTINENT  PMH / PSH: Dementia, seizures, BPH, GERD, rectal adenocarcinoma stage I  OBJECTIVE:   BP 122/64   Pulse 78   Ht '5\' 4"'$  (1.626 m)   Wt 138 lb 12.8 oz (63 kg)   SpO2 99%   BMI 23.82 kg/m    General: Awake, alert, no acute distress Respiratory: Clear to auscultation anteriorly Cardiac: Regular rate and rhythm, no murmurs, strong regular right radial pulse Psych: Alert and oriented to self, situation, and place, perseverative regarding his need to be productive, over-diagnosis of anxiety in [place], and the 2 grades he failed during school back in the 1950s  ASSESSMENT/PLAN:   Dementia (HCC) Sleep disturbances greatly improved on mirtazapine 15 mg nightly.  Will continue with no changes.  In regards to anxiety, I encouraged sister to reach out to list of psychiatrists provided by PCP and find someone in their network to see.  I am unsure if the anxiety which mostly  concerns family and friends is the type of anxiety that would respond to medication.  This may be more of a personality trait or disorder than anxiety.  The patient himself is not bothered by anxiety whatsoever.  I discussed turning care back over to his PCP and his new psychiatrist, as a medication we started in our geriatric clinic is now stable and effective.  Any future medication changes should be made by his PCP.  Patient and his sister voice understanding and are appreciative.     Ezequiel Essex, MD [place]

## 2022-03-06 ENCOUNTER — Telehealth: Payer: Self-pay

## 2022-03-06 NOTE — Telephone Encounter (Signed)
error 

## 2022-03-23 ENCOUNTER — Ambulatory Visit (INDEPENDENT_AMBULATORY_CARE_PROVIDER_SITE_OTHER): Payer: Medicare HMO | Admitting: Physician Assistant

## 2022-03-23 ENCOUNTER — Encounter: Payer: Self-pay | Admitting: Psychology

## 2022-03-23 VITALS — BP 163/73 | HR 79 | Ht 67.0 in | Wt 141.6 lb

## 2022-03-23 DIAGNOSIS — F0390 Unspecified dementia without behavioral disturbance: Secondary | ICD-10-CM | POA: Diagnosis not present

## 2022-03-23 NOTE — Progress Notes (Signed)
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 RH male seen today in follow up for memory loss. Patient is currently on memantine 5 mg twice daily and donepezil 5 mg daily . MMSE today is stable at 26/30. He remains independent and able to perform ADL'S  No further seizure activity, he is on Keppra 1250 mg twice daily. He still has not found a psychiatrist for GAD and ADD, information /list of providers given.    Follow up in  6 months. Continue Memantine 5 mg twice daily. Side effects were discussed  Continue donepezil 5 mg daily. Side effects were discussed  Recommend Psychiatry for evaluation of ADD and GAD.  Continue Keppra for history of seizures.  Recommend no further driving Recommend good control of cardiovascular risk factors.       Subjective:    This patient is accompanied in the office by his sister  who supplements the history.  Previous records as well as any outside records available were reviewed prior to todays visit. Patient was last seen on 12/07/21. Last MMSE 09/2021 was 27/30.      Any changes in memory since last visit? LTM is god. Patient has some difficulty remembering recent conversations and people names. Enjoys reading the NYT and Maud. He does not like crossword puzzles.  repeats oneself?  Endorsed Disoriented when walking into a room?  Patient denies except occasionally not remembering what patient came to the room for    Leaving objects in unusual places?  Denies, but misplaces them.   Wandering behavior?  denies   Any personality changes since last visit?  His sister reports possible undiagnosed ADD/ADHD, to be evaluated by Psychiatry  Any worsening depression?:  denies   Hallucinations or paranoia?  denies   Seizures? Denies aura, taste changes, dizziness or body jerks     Any sleep changes?  He has insomnia, improved with mirtazapine.  Denies vivid  dreams, REM behavior or sleepwalking   Sleep apnea?   denies   Any hygiene concerns?    denies   Independent of bathing and dressing?  Endorsed  Does the patient needs help with medications? Sister is in charge  Who is in charge of the finances?  Sister is in charge    Any changes in appetite?  denies    Patient have trouble swallowing?  denies   Does the patient cook?  Any kitchen accidents such as leaving the stove on? Patient denies   Any headaches?   denies   Chronic back pain  denies   Ambulates with difficulty?  Walk around the house, afraid to walk alone in the street, no longer attends the [facility] because is closing down.  Recent falls or head injuries? denies     Unilateral weakness, numbness or tingling?    denies   Any tremors?  denies   Any anosmia?  Patient denies   Any incontinence of urine?  denies   Any bowel dysfunction? Occasional diarrhea,  more frequent flatulence, upset stomach in view of history of GI cancer      Patient lives  Alone, with caregiver 6 x a week M-S 10-2  Does the patient drive? No issues   History on Initial Assessment 11/07/2018: This is a pleasant 77 year old right-handed man with no significant past medical history presenting for evaluation of memory loss and frequent falls. His sister is present to provide additional  information. He thinks his memory is reasonable. He states family has noticed more changes, they ask him things and "I don't give good answers." He endorses short-term memory changes. He lives with his 65 year old mother who has 4 caregivers. 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 He denies getting lost driving. He denies any missed bill payments. He is not on any medications. He denies any difficulties at work, he states it is routine work. His sister started noticing changes a year ago, he would forget conversations. He would forget he had already finished a task, for instance that he had already came from the grocery  store. She feels symptoms worsened in the past 5-6 months. Last month, he was visiting another sister and forgot the last portion of the trip, he did not get to his destination and just went home. He has told family he has gotten lost coming home from work, he arrived home an hour later (he has been in the same place for 7 years). His family is also concerned about an increase in frequency of falls. He has had 3 falls in the past 6 months. He reports injuring his foot and hitting his head with a fall in April, then hurting his back in July. He thinks it is due to clumsiness. No loss of consciousness. They are concerned about the possibility of a brain tumor, their father had a brain tumor. There is a history of memory loss in his mother and younger brother (secondary to stroke). He denies any history of significant head injuries or alcohol use.    He denies any headaches, dizziness, diplopia, dysarthria, dysphagia, neck/back pain, focal numbness/tingling/weakness, bowel/bladder dysfunction, anosmia, or tremors. He felt unwell last night and BP was 151/86, he went home early from work. He states he is on his feet 12 hours a day, and he is now required to work 6 days a week (mandatory due to Covid-19 changes), he was previously working 5 days a week. His sister is concerned he is not eating well, mostly sugars and hotdogs. He is overly tired and sleepy some days. Mood is good, he has "always been hyper, but now it more" per sister. No hallucinations or paranoia.    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 There were "impairments in retrieval/consolidation aspects of verbal memory, working memory, confrontation naming, and complex  visuoconstructional abilities. Additional variability was seen across processing speed and executive functioning. Visual encoding (i.e., learning) was also a relative weakness. Findings could suggest prominent left temporal dysfunction assuming normal lateralization in this right-handed individual. However, other aspects of expressive language (i.e., verbal fluency) were within normal limits. The aforementioned deficits, coupled with additional weaknesses across aspects of executive functioning and more advanced visuoconstructional abilities could be concerning for Alzheimer's disease. However appropriate retrieval/consolidation aspects of visual memory, coupled with strong semantic fluency scores, is inconsistent with what would typically be expected."  PREVIOUS MEDICATIONS:   CURRENT MEDICATIONS:  Outpatient Encounter Medications as of 03/23/2022  Medication Sig   dicyclomine (BENTYL) 20 MG tablet Take 20 mg by mouth 3 (three) times daily as needed.   donepezil (ARICEPT) 5 MG tablet Take half tablet 5 mg daily   levETIRAcetam (KEPPRA) 1000 MG tablet Take 1 tablet twice a day (take with Levetiracetam  212m tablet for total of 12544mtwice a day)   levETIRAcetam (KEPPRA) 250 MG tablet TAKE 5 TABLETS BY MOUTH TWICE DAILY   loperamide (IMODIUM) 2 MG capsule Take 1 capsule (2 mg total) by mouth every 6 (six) hours as needed for diarrhea or loose stools.   memantine (NAMENDA) 5 MG tablet Take 1 tablet (5 mg total) by mouth at bedtime.   methylcellulose (CITRUCEL) oral powder Take by mouth.   mirtazapine (REMERON) 15 MG tablet Take one tablet (15 mg) BY MOUTH AT BEDTIME FOR INSOMNIA   Multiple Vitamin (MULTIVITAMIN WITH MINERALS) TABS tablet Take 1 tablet by mouth daily.   NON FORMULARY Take 1 capsule by mouth daily. Mind works   famotidine (PEPCID) 20 MG tablet Take 1 tablet (20 mg total) by mouth 2 (two) times daily as needed.   No facility-administered encounter medications on file as of 03/23/2022.        03/23/2022   10:00 AM 09/20/2021    2:00 PM 06/06/2020   10:00 AM  MMSE - Mini Mental State Exam  Orientation to time 4 5 5  $ Orientation to Place 5 5 5  $ Registration 3 3 3  $ Attention/ Calculation 5 5 3  $ Recall 0 0 2  Language- name 2 objects 2 2 2  $ Language- repeat 1 1 1  $ Language- follow 3 step command 3 3 3  $ Language- read & follow direction 1 1 1  $ Write a sentence 1 1 1  $ Copy design 1 1 0  Total score 26 27 26      $ 10/25/2020    3:00 PM 11/07/2018    9:00 AM  Montreal Cognitive Assessment   Visuospatial/ Executive (0/5) 2 3  Naming (0/3) 3 3  Attention: Read list of digits (0/2) 1 2  Attention: Read list of letters (0/1) 1 1  Attention: Serial 7 subtraction starting at 100 (0/3) 3 3  Language: Repeat phrase (0/2) 2 2  Language : Fluency (0/1) 1 0  Abstraction (0/2) 2 2  Delayed Recall (0/5) 0 0  Orientation (0/6) 6 6  Total 21 22  Adjusted Score (based on education) 21 22    Objective:     PHYSICAL EXAMINATION:    VITALS:   Vitals:   03/23/22 0918  BP: (!) 163/73  Pulse: 79  SpO2: 97%  Weight: 141 lb 9.6 oz (64.2 kg)  Height: 5' 7"$  (1.702 m)    GEN:  The patient appears stated age and is in NAD. HEENT:  Normocephalic, atraumatic.   Neurological examination:  General: NAD, well-groomed, appears stated age. Orientation: The patient is alert. Oriented to person, place and date. Cranial nerves: There is good facial symmetry. Anxious appearing.The speech is fluent and clear. No aphasia or dysarthria. Fund of knowledge is appropriate. Recent and remote memory are impaired. Attention and concentration are reduced.  Able to name objects and repeat phrases.  Hearing is intact to conversational tone.    Sensation: Sensation is intact to light touch throughout Motor: Strength is at least antigravity x4. DTR's 2/4 in UE/LE     Movement examination: Tone: There is normal tone in the UE/LE Abnormal movements:  no tremor.  No myoclonus.  No asterixis.    Coordination:  There is no decremation with RAM's. Normal finger to nose  Gait and Station: The patient has no difficulty arising out of a deep-seated chair without the use of the hands. The patient's stride length is good.  Gait is cautious and narrow.    Thank  you for allowing [place] the opportunity to participate in the care of this nice patient. Please do not hesitate to contact us for any questions or concerns.   Total time spent on today's visit was 34 minutes dedicated to this patient today, preparing to see patient, examining the patient, ordering tests and/or medications and counseling the patient, documenting clinical information in the EHR or other health record, independently interpreting results and communicating results to the patient/family, discussing treatment and goals, answering patient's questions and coordinating care.  Cc:  [name], MD  Sharene Butters 03/23/2022 10:35 AM

## 2022-03-23 NOTE — Patient Instructions (Addendum)
It was a pleasure to see you today at our office.   Recommendations:  Meds: Follow up in 6 months  Continue donepezil  5 mg daily.   Start memantine 5 mg at night  Continue Keppra  Recommend Psychiatric evaluation for ADD/ADHD  Repeat the Neuropsychological evaluation for clarity of the diagnosis   I would recommend psychiatry.  You don't need a referral.  You can contact some of these resources to see if they are accepting new patients and to see if they accept your insurance:  Dr. Modena Morrow - [phone] [facility]) - [facility] [place]) - [phone] Dr. Chucky May [facility]) [phone] [facility] [place]) [phone] [name]) - [place]) - [place], [address] [place], [place] Dr. Garner Nash (neuropsychiatry); Mike Craze; [facility]; 9th Floor; [place], [place] [id number] Dr. Norman Clay; [place]; [place]; [place], [place] [address]  [phone]    RECOMMENDATIONS FOR ALL PATIENTS WITH MEMORY PROBLEMS: 1. Continue to exercise (Recommend 30 minutes of walking everyday, or 3 hours every week) 2. Increase social interactions - continue going to [place] and enjoy social gatherings with friends and family 3. Eat healthy, avoid fried foods and eat more fruits and vegetables 4. Maintain adequate blood pressure, blood sugar, and blood cholesterol level. Reducing the risk of stroke and cardiovascular disease also helps promoting better memory. 5. Avoid stressful situations. Live a simple life and avoid aggravations. Organize your time and prepare for the next day in anticipation. 6. Sleep well, avoid any interruptions of sleep and avoid any distractions in the bedroom that  may interfere with adequate sleep quality 7. Avoid sugar, avoid sweets as there is a strong link between excessive sugar intake, diabetes, and cognitive impairment We discussed the Mediterranean diet, which has been shown to help patients reduce the risk of progressive memory disorders and reduces cardiovascular risk. This includes eating fish, eat fruits and green leafy vegetables, nuts like almonds and hazelnuts, walnuts, and also use olive oil. Avoid fast foods and fried foods as much as possible. Avoid sweets and sugar as sugar use has been linked to worsening of memory function.  There is always a concern of gradual progression of memory problems. If this is the case, then we may need to adjust level of care according to patient needs. Support, both to the patient and caregiver, should then be put into place.    The Alzheimer's Association is here all day, every day for people facing Alzheimer's disease through our free 24/7 Helpline: [phone]. The Helpline provides reliable information and support to all those who need assistance, such as individuals living with memory loss, Alzheimer's or other dementia, caregivers, health care professionals and the public.  Our highly trained and knowledgeable staff can help you with: Understanding memory loss, dementia and Alzheimer's  Medications and other treatment options  General information about aging and brain health  Skills to provide quality care and to find the best care from professionals  Legal, financial and living-arrangement decisions Our Helpline also features: Confidential care consultation provided by master's level clinicians who can help with decision-making support, crisis assistance and education on issues families face every day  Help in a caller's preferred language using our translation service that features more than 200 languages and dialects  Referrals to local community programs, services and ongoing support     FALL  PRECAUTIONS:  Be cautious when walking. Scan the area for obstacles that may increase the risk of trips and falls. When getting up in the mornings, sit up at the edge of the bed for a few minutes before getting out of bed. Consider elevating the bed at the head end to avoid drop of blood pressure when getting up. Walk always in a well-lit room (use night lights in the walls). Avoid area rugs or power cords from appliances in the middle of the walkways. Use a walker or a cane if necessary and consider physical therapy for balance exercise. Get your eyesight checked regularly.  FINANCIAL OVERSIGHT: Supervision, especially oversight when making financial decisions or transactions is also recommended.  HOME SAFETY: Consider the safety of the kitchen when operating appliances like stoves, microwave oven, and blender. Consider having supervision and share cooking responsibilities until no longer able to participate in those. Accidents with firearms and other hazards in the house should be identified and addressed as well.   ABILITY TO BE LEFT ALONE: If patient is unable to contact 911 operator, consider using LifeLine, or when the need is there, arrange for someone to stay with patients. Smoking is a fire hazard, consider supervision or cessation. Risk of wandering should be assessed by caregiver and if detected at any point, supervision and safe proof recommendations should be instituted.  MEDICATION SUPERVISION: Inability to self-administer medication needs to be constantly addressed. Implement a mechanism to ensure safe administration of the medications.   DRIVING: Regarding driving, in patients with progressive memory problems, driving will be impaired. We advise to have someone else do the driving if trouble finding directions or if minor accidents are reported. Independent driving assessment is available to determine safety of driving.   If you are interested in the driving assessment, you can contact  the following:  [employer] in [place]  [place] [phone] or [phone]      [place] refers to food and lifestyle choices that are based on the traditions of countries located on the [employer]. This way of eating has been shown to help prevent certain conditions and improve outcomes for people who have chronic diseases, like kidney disease and heart disease. What are tips for following this plan? Lifestyle  Cook and eat meals together with your family, when possible. Drink enough fluid to keep your urine clear or pale yellow. Be physically active every day. This includes: Aerobic exercise like running or swimming. Leisure activities like gardening, walking, or housework. Get 7-8 hours of sleep each night. If recommended by your health care provider, drink red wine in moderation. This means 1 glass a day for nonpregnant women and 2 glasses a day for men. A glass of wine equals 5 oz (150 mL). Reading food labels  Check the serving size of packaged foods. For foods such as rice and pasta, the serving size refers to the amount of cooked product, not dry. Check the total fat in packaged foods. Avoid foods that have saturated fat or trans fats. Check the ingredients list for added sugars, such as corn syrup. Shopping  At the grocery store, buy most of your food from the areas near the walls of the store. This includes: Fresh fruits and vegetables (produce). Grains, beans, nuts, and seeds. Some of these may be available in unpackaged forms or large amounts (in bulk). Fresh seafood. Poultry and eggs. Low-fat dairy products. Buy whole ingredients  instead of prepackaged foods. Buy fresh fruits and vegetables in-season from local farmers markets. Buy frozen fruits and vegetables in resealable bags. If you do not have access to  quality fresh seafood, buy precooked frozen shrimp or canned fish, such as tuna, salmon, or sardines. Buy small amounts of raw or cooked vegetables, salads, or olives from the deli or salad bar at your store. Stock your pantry so you always have certain foods on hand, such as olive oil, canned tuna, canned tomatoes, rice, pasta, and beans. Cooking  Cook foods with extra-virgin olive oil instead of using butter or other vegetable oils. Have meat as a side dish, and have vegetables or grains as your main dish. This means having meat in small portions or adding small amounts of meat to foods like pasta or stew. Use beans or vegetables instead of meat in common dishes like chili or lasagna. Experiment with different cooking methods. Try roasting or broiling vegetables instead of steaming or sauteing them. Add frozen vegetables to soups, stews, pasta, or rice. Add nuts or seeds for added healthy fat at each meal. You can add these to yogurt, salads, or vegetable dishes. Marinate fish or vegetables using olive oil, lemon juice, garlic, and fresh herbs. Meal planning  Plan to eat 1 vegetarian meal one day each week. Try to work up to 2 vegetarian meals, if possible. Eat seafood 2 or more times a week. Have healthy snacks readily available, such as: Vegetable sticks with hummus. Greek yogurt. Fruit and nut trail mix. Eat balanced meals throughout the week. This includes: Fruit: 2-3 servings a day Vegetables: 4-5 servings a day Low-fat dairy: 2 servings a day Fish, poultry, or lean meat: 1 serving a day Beans and legumes: 2 or more servings a week Nuts and seeds: 1-2 servings a day Whole grains: 6-8 servings a day Extra-virgin olive oil: 3-4 servings a day Limit red meat and sweets to only a few servings a month What are my food choices? Mediterranean diet Recommended Grains: Whole-grain pasta. Brown rice. Bulgar wheat. Polenta. Couscous. Whole-wheat bread. [place] Morrow. Vegetables:  Artichokes. Beets. Broccoli. Cabbage. Carrots. Eggplant. Green beans. Chard. Kale. Spinach. Onions. Leeks. Peas. Squash. Tomatoes. Peppers. Radishes. Fruits: Apples. Apricots. Avocado. Berries. Bananas. Cherries. Dates. Figs. Grapes. Lemons. Melon. Oranges. Peaches. Plums. Pomegranate. Meats and other protein foods: Beans. Almonds. Sunflower seeds. Pine nuts. Peanuts. [place]. Salmon. Scallops. Shrimp. [place]. Tilapia. Clams. Oysters. Eggs. Dairy: Low-fat milk. Cheese. Greek yogurt. Beverages: Water. Red wine. Herbal tea. Fats and oils: Extra virgin olive oil. Avocado oil. Grape seed oil. Sweets and desserts: [place] yogurt with honey. Baked apples. Poached pears. Trail mix. Seasoning and other foods: Basil. Cilantro. Coriander. Cumin. Mint. Parsley. Sage. Rosemary. Tarragon. Garlic. Oregano. Thyme. Pepper. Balsalmic vinegar. Tahini. Hummus. Tomato sauce. Olives. Mushrooms. Limit these Grains: Prepackaged pasta or rice dishes. Prepackaged cereal with added sugar. Vegetables: Deep fried potatoes (french fries). Fruits: Fruit canned in syrup. Meats and other protein foods: Beef. Pork. Lamb. Poultry with skin. Hot dogs. [place] Salines. Dairy: Ice cream. Sour cream. Whole milk. Beverages: Juice. Sugar-sweetened soft drinks. Beer. Liquor and spirits. Fats and oils: Butter. Canola oil. Vegetable oil. Beef fat (tallow). Lard. Sweets and desserts: Cookies. Cakes. Pies. Candy. Seasoning and other foods: Mayonnaise. Premade sauces and marinades. The items listed may not be a complete list. Talk with your dietitian about what dietary choices are right for you. Summary The Mediterranean diet includes both food and lifestyle choices. Eat a variety of fresh fruits and vegetables, beans, nuts, seeds, and  whole grains. Limit the amount of red meat and sweets that you eat. Talk with your health care provider about whether it is safe for you to drink red wine in moderation. This means 1 glass a day for nonpregnant women and 2  glasses a day for men. A glass of wine equals 5 oz (150 mL). This information is not intended to replace advice given to you by your health care provider. Make sure you discuss any questions you have with your health care provider. Document Released: 09/15/2015 Document Revised: 10/18/2015 Document Reviewed: 09/15/2015 Elsevier Interactive Patient Education  2017 [employer].

## 2022-03-26 ENCOUNTER — Ambulatory Visit (HOSPITAL_COMMUNITY)
Admission: RE | Admit: 2022-03-26 | Discharge: 2022-03-26 | Disposition: A | Discharge status: Home / Self Care | Payer: Medicare HMO | Source: Ambulatory Visit | Attending: Hematology | Admitting: Hematology

## 2022-03-26 ENCOUNTER — Encounter (HOSPITAL_COMMUNITY): Payer: Self-pay

## 2022-03-26 DIAGNOSIS — C20 Malignant neoplasm of rectum: Secondary | ICD-10-CM | POA: Insufficient documentation

## 2022-03-26 LAB — POCT I-STAT CREATININE: Creatinine, Ser: 1 mg/dL (ref 0.61–1.24)

## 2022-03-26 MED ORDER — IOHEXOL 300 MG/ML  SOLN
100.0000 mL | Freq: Once | INTRAMUSCULAR | Status: AC | PRN
  Administered 2022-03-26: 100 mL via INTRAVENOUS

## 2022-03-26 MED ORDER — SODIUM CHLORIDE (PF) 0.9 % IJ SOLN
INTRAMUSCULAR | Status: AC
  Filled 2022-03-26: qty 50

## 2022-03-27 NOTE — Progress Notes (Unsigned)
[place]   [phone] [phone]   Clinic Follow up Note   Patient Care Team: [name], MD as PCP - General (Family Medicine) Cameron Sprang, MD as Consulting Physician (Neurology) Truitt Merle, MD as Consulting Physician (Hematology) Ronnette Juniper, MD as Consulting Physician (Gastroenterology) Elease Hashimoto (Neurology) Hester Mates, OD as Referring Physician (Optometry)  Date of Service:  03/28/2022  CHIEF COMPLAINT: f/u of rectal cancer   CURRENT THERAPY: Surveillance    ASSESSMENT:  Carlos [name] is a 77 y.o. 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 -He underwent low anterior resection [facility] 02/08/20. -surveillance colonoscopy with Dr. KTherisa Doynein 09/2020 was benign -CT CAP 02/20/21 showed NED. -I personally reviewed his restaging scan from March 26, 2022, which showed no evidence of recurrence.  I discussed the incidental findings also. -He is clinically doing well, lab reviewed, exam was unremarkable, no clinical concern for recurrence. -It has been 3 years since his initial diagnosis, his recurrence risk has significantly decreased.  Will continue cancer surveillance for 2 more years.  I will only repeat CT scan if clinically needed.    Renal mass -CT scan since 2021 showed multiple renal cystic masses -Recent CT scan from February 2024 showed bilateral renal masses measuring 2.3 to 2.5 cm, overall stable from last scan, slightly enlarged since 2021. -I discussed this findings with patient, pt states he has been seen by urology and he will f/u with them     PLAN: -Discuss CT scan-no signs of recurrence -lab,f/u in 6 months  SUMMARY OF ONCOLOGIC HISTORY: Oncology History Overview Note   Cancer Staging Rectal adenocarcinoma ([facility] Staging form: Colon and Rectum, AJCC 8th Edition - Clinical stage from 06/12/2019: cT4, cN1 - Unsigned Stage prefix: Initial diagnosis - Pathologic stage from 02/08/2020: Stage I (ypT1, pN0, cM0) - Signed by FTruitt Merle MD on 04/06/2020 Stage prefix: Post-therapy Total positive nodes: 0 Residual tumor (R): R0 - None    Adenocarcinoma of rectum ([facility]  05/29/2019 Procedure   Colonoscopy per Dr. KTherisa DoyneA digital rectal exam was normal  frond-like fungating infiltrative non-obstructing large mass in the rectum. The mass was partially circumferential, measured 5 cm in length. The colonoscopy also showed 11 sessile polyps in the transverse and asecnding colon and hepatic flexure.    05/29/2019 Initial Biopsy   Pathology on the rectal biopsy showed at least intramucosal adenocarcinoma involving tubulovillous adenoma with high grade dysplasia.   06/05/2019 Imaging   CT CAP IMPRESSION: 1. Cholelithiasis and gallbladder wall thickening with equivocal pericholecystic inflammation. Acute cholecystitis not excluded. If there is clinical suspicion for acute cholecystitis, recommend ultrasound evaluation. 2. Approximately 5 cm irregular mass within the distal sigmoid colon with possible extension through the wall. No definite abnormal adjacent lymph nodes. 3. Mild omental haziness-nonspecific. Metastatic disease not excluded. 4. Indeterminate 3-4 mm RIGHT pulmonary nodules which could represent metastatic disease. These are too small for PET CT evaluation. Consider CT follow-up. 5. 2.5 cm irregular cystic mass within the RIGHT kidney. Elective MRI recommended for further evaluation. 6. 4 mm nonobstructing LEFT LOWER pole renal calculus. 7. Coronary artery disease. 8. Aortic Atherosclerosis (ICD10-I70.0).   06/12/2019 Imaging   Staging MRI pelvis  FINDINGS: TUMOR LOCATION Tumor distance from Anal Verge/Skin Surface:  11.4 cm Tumor distance to Internal  Anal  Sphincter: 6.5 cm TUMOR DESCRIPTION Circumferential Extent: 100% Tumor Length: 8.3 cm T - CATEGORY Extension through Muscularis Propria: Yes>92m=T3d Shortest Distance of any tumor/node from Mesorectal Fascia: 0 mm, along the left lateral and posterior walls (tumor abuts the left lateral pelvic sidewall and sacrum) Extramural Vascular Invasion/Tumor Thrombus: No Invasion of Anterior Peritoneal Reflection: No Involvement of Adjacent Organs or Pelvic Sidewall: Yes, involving the left lateral pelvic sidewall =T4 Levator Ani Involvement: No N - CATEGORY Mesorectal Lymph Nodes >=538m 2=N1 Extra-mesorectal Lymphadenopathy: No Other:  None. IMPRESSION: Rectal adenocarcinoma T stage: T4 Rectal adenocarcinoma N stage:  N1 Distance from tumor to the internal anal sphincter is 6.5 cm.   06/18/2019 Initial Diagnosis   Rectal adenocarcinoma ([facility]  07/02/2019 - 10/08/2019 Chemotherapy   Total Neoadjuvant FOLFOX q2weeks starting 07/02/19-10/08/19,  8 cycles   07/13/2019 PET scan   IMPRESSION: 1. Known rectal mass is intensely hypermetabolic compatible with primary rectal adenocarcinoma. No findings of FDG avid nodal metastasis or distant metastatic disease. 2. Hyperdense kidney cysts are identified bilaterally favored to represent hemorrhagic cyst. 3. Nonobstructing left renal calculi. 4. Aortic atherosclerosis, in addition to lad coronary artery disease. Please note that although the presence of coronary artery calcium documents the presence of coronary artery disease, the severity of this disease and any potential stenosis cannot be assessed on this non-gated CT examination. Assessment for potential risk factor modification, dietary therapy or pharmacologic therapy may be warranted, if clinically indicated. Aortic Atherosclerosis (ICD10-I70.0). 5. Gallstones.   09/11/2019 - 09/14/2019 Hospital Admission   Acute AMS, seizure 09/11/19 -Developed acute altered mental status day after Cycle 6 chemo  (09/11/19).  -He was admitted for work up, EEG showed epileptic spike. ID work up negative, MRI negative for metastasis.    09/11/2019 Imaging   MRI Brain  IMPRESSION: Incomplete study. Images obtained reveal no acute abnormality. Negative for acute infarct.   Atrophy and mild ventricular enlargement stable from prior studies.   09/11/2019 Imaging   CT head  IMPRESSION: Mild ventricular prominence with normal appearing sulci. Question early communicating hydrocephalus. Brain parenchyma appears unremarkable. No acute infarct. No mass or hemorrhage.   Foci of arterial vascular calcification noted. There is mucosal thickening in several ethmoid air cells.   There is probable cerumen in the right external auditory canal.     09/23/2019 Imaging   CT AP  IMPRESSION: 1. Solid-appearing left eccentric upper rectal mass measures about 6.7 by 2.9 cm. This is difficult to compare directly to the prior exam given differences in orientation and degree of distension of the rectum. There is some prominence of stool in the sigmoid colon and rectum on today's examination, but the mass is not obstructive. 2. Other imaging findings of potential clinical significance: Cholelithiasis. Complex bilateral renal lesions are similar to prior exams. Nonobstructive left nephrolithiasis. Mild prostatomegaly with nodular prominence the upper margin of the prostate gland. Lumbar spondylosis and degenerative disc disease causing multilevel foraminal impingement. 3. Aortic atherosclerosis.   Aortic Atherosclerosis (ICD10-I70.0).   11/02/2019 - 12/09/2019 Chemotherapy   Concurrent chemo RT with Oral Xeloda 15001mID on days of RT starting 11/02/19-12/09/19   11/02/2019 - 12/09/2019 Radiation Therapy   Concurrent chemo RT by Dr MooLisbeth Renshawth Xeloda  starting 11/02/19-12/09/19   01/13/2020 Imaging   [facility]  Reduced size of the previously demonstrated high rectal mass. The residual tumor measures 2.4 cm in maximal  diameter and is primarily endoluminal. There is a similar amount of extramural disease spread into the  left mesorectal space and presacral space.  2.  Improved appearance of lymphadenopathy with several conspicuous mesorectal and internal iliac lymph nodes.  3.  Small volume of nonspecific free fluid in the pelvis.   01/29/2020 Imaging   CT CAP  IMPRESSION: 1. Interval response to therapy of rectosigmoid junction primary. 2. No findings of metastatic disease within the chest, abdomen, or pelvis. 3. Small pulmonary nodules, similar and decreased as detailed above. Nonspecific. 4. Right-sided Port-A-Cath tip at high IVC. consider repositioning. 5. Cholelithiasis. 6. Left nephrolithiasis. 7. Bilateral renal lesions. A medial upper pole renal lesion is suspicious for renal cell carcinoma. This could be re-evaluated at follow-up or more entirely characterized with routine outpatient pre and post contrast abdominal MRI.   02/08/2020 Surgery   LOWER ANTERIOR RESECTION RECTUM LAPAROSCOPIC by Dr Morton Stall at [facility]   Procedures   PR LAP,SURG,COLECTOMY, PARTIAL, W/ANAST   PR LAP, SURG MOBIL SPLENIC FL DUR PTL COLECTOMY   PR LAP, SURG ILEO/JEJUNO-STOMY   SIGMOID COLECTOMY LAPAROSCOPIC ASSISTED     02/08/2020 Pathology Results   Final Pathologic Diagnosis at [place], "IMA", EXCISION:              Two lymph nodes are negative for metastatic carcinoma (0/2).   B.  SIGMOID COLON AND RECTUM, LOWER ANTERIOR RESECTION: Residual well differentiated invasive adenocarcinoma of the rectum, arising from a tubulovillous adenoma with high grade dyplasia.               Size: 2 mm in greatest dimension.  Tumor invades the submucosa.  Negative for perineural and lymphovascular invasion. Resection margins are negative for malignancy or dysplasia.  Twenty-two lymph nodes are negative for metastatic carcinoma (0/22). Complete mesorectum.               See CAP cancer protocol summary  and comment.   C.  PROXIMAL DOUGHNUT, RESECTION:                      Segment of benign colonic tissue.              Negative for malignancy or dyplasia.    D.  DISTAL DOUGHNUT, RESECTION:                            Segment of benign colonic tissue.              Negative for malignancy or dyplasia.    PATHOLOGIC STAGE CLASSIFICATION (pTNM, AJCC 8th Edition)         TNM Descriptors:    y (post-treatment)     Primary Tumor (pT):    pT1     Regional Lymph Nodes (pN):    pN0      02/08/2020 Cancer Staging   Staging form: Colon and Rectum, AJCC 8th Edition - Pathologic stage from 02/08/2020: Stage I (ypT1, pN0, cM0) - Signed by Truitt Merle, MD on 04/06/2020 Stage prefix: Post-therapy Total positive nodes: 0 Residual tumor (R): R0 - None   07/05/2020 Imaging   CT C/A/P w/o contrast  IMPRESSION: 1. Mildly indistinct appearance of the fat and gallbladder wall separation in the upper abdomen, of uncertain significance and in the setting of abundant gallstones in the gallbladder lumen. Correlate with any clinical signs of abdominal pain and consider further evaluation with ultrasound as warranted. 2. RIGHT-sided Port-A-Cath tip at the high IVC near the diaphragmatic hiatus. Could consider  repositioning as warranted. 3. 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 Consider follow-up evaluation, renal protocol study with with MRI with and without contrast as outlined in previous imaging. Hemorrhagic and/or proteinaceous cysts elsewhere in the kidneys. 4. LEFT nephrolithiasis as before with lower pole calculus approximately 6 mm. 5. No signs of metastatic disease with abundant stool in the colon following partial colonic resection. Correlate with any signs of constipation. 6. 4 mm nodule in the superior segment of the RIGHT lower lobe is unchanged. Attention on follow-up 7. Small midline periumbilical hernia containing fat. 8. Aortic atherosclerosis.   03/26/2022  Imaging    IMPRESSION: No evidence of recurrent or metastatic colon carcinoma.   Stable 2.3 cm solid right renal mass. Renal cell carcinoma cannot be excluded.   Cholelithiasis. No radiographic evidence of cholecystitis.   Colonic diverticulosis. No radiographic evidence of diverticulitis.   Stable mildly enlarged prostate.      INTERVAL HISTORY:  Carlos [name] is here for a follow up of  rectal cancer He was last seen by me on 06/28/2021 He presents to the clinic alone. Pt states he is doing well. Pt reports of being Gassy. Pt appetite is good .      All other systems were reviewed with the patient and are negative.  MEDICAL HISTORY:  Past Medical History:  Diagnosis Date   Acute blood loss anemia    Acute encephalopathy 09/11/2019   Adenocarcinoma of rectum ([place]) 06/18/2019   Anxiety    Benign prostatic hyperplasia 03/30/2013   10/1 IMO update   Bilateral posterior capsular opacification 12/22/2021   Breakthrough seizure ([place]) 12/08/2019   Closed comminuted intertrochanteric fracture of left femur ([place])    Combined forms of age-related cataract of left eye 12/22/2021   Combined forms of age-related cataract of right eye 12/22/2021   Hypoalbuminemia due to protein-calorie malnutrition (HCC)    Left hip pain    Macular puckering, 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/23/2019   Posterior vitreous detachment of both eyes 12/22/2021   Pressure ulcer, stage 2 ([place]) 12/12/2019   rectal ca dx'd 06/2016   Seizures ([place])     SURGICAL HISTORY: Past Surgical History:  Procedure Laterality Date   COLON SURGERY     HIP SURGERY     INTRAMEDULLARY (IM) NAIL INTERTROCHANTERIC Left 12/11/2019   Procedure: INTRAMEDULLARY (IM) NAIL INTERTROCHANTRIC;  Surgeon: Rod Can, MD;  Location: [facility];  Service: Orthopedics;  Laterality: Left;   IR REMOVAL TUN ACCESS W/ PORT W/O FL MOD SED  07/29/2020   PORTACATH PLACEMENT N/A 07/01/2019    Procedure: PORT ULTRASOUND GUIDED PLACEMENT;  Surgeon: Alphonsa Overall, MD;  Location: [facility];  Service: General;  Laterality: N/A;   PROSTATE SURGERY  2004   RECTAL SURGERY     TONSILECTOMY, ADENOIDECTOMY, BILATERAL MYRINGOTOMY AND TUBES      I have reviewed the social history and family history with the patient and they are unchanged from previous note.  ALLERGIES:  has No Known Allergies.  MEDICATIONS:  Current Outpatient Medications  Medication Sig Dispense Refill   dicyclomine (BENTYL) 20 MG tablet Take 20 mg by mouth 3 (three) times daily as needed.     donepezil (ARICEPT) 5 MG tablet Take half tablet 5 mg daily 30 tablet 3   famotidine (PEPCID) 20 MG tablet Take 1 tablet (20 mg total) by mouth 2 (two) times daily as needed. 90 tablet 0   levETIRAcetam (KEPPRA) 1000 MG tablet Take 1  tablet twice a day (take with Levetiracetam 219m tablet for total of 12561mtwice a day) 180 tablet 1   levETIRAcetam (KEPPRA) 250 MG tablet TAKE 5 TABLETS BY MOUTH TWICE DAILY 900 tablet 3   loperamide (IMODIUM) 2 MG capsule Take 1 capsule (2 mg total) by mouth every 6 (six) hours as needed for diarrhea or loose stools. 30 capsule 0   memantine (NAMENDA) 5 MG tablet Take 1 tablet (5 mg total) by mouth at bedtime. 30 tablet 11   methylcellulose (CITRUCEL) oral powder Take by mouth.     mirtazapine (REMERON) 15 MG tablet Take one tablet (15 mg) BY MOUTH AT BEDTIME FOR INSOMNIA 30 tablet 2   Multiple Vitamin (MULTIVITAMIN WITH MINERALS) TABS tablet Take 1 tablet by mouth daily.     NON FORMULARY Take 1 capsule by mouth daily. Mind works     No current facility-administered medications for this visit.    PHYSICAL EXAMINATION: ECOG PERFORMANCE STATUS: 1 - Symptomatic but completely ambulatory  Vitals:   03/28/22 1154  BP: 131/71  Pulse: 73  Resp: 16  Temp: 97.8 F (36.6 C)  SpO2: 100%   Wt Readings from Last 3 Encounters:  03/28/22 140 lb 14.4 oz (63.9 kg)  03/23/22 141 lb 9.6 oz (64.2 kg)   02/12/22 138 lb 12.8 oz (63 kg)     GENERAL:alert, no distress and comfortable SKIN: skin color normal, no rashes or significant lesions EYES: normal, Conjunctiva are pink and non-injected, sclera clear  NEURO: alert & oriented x 3 with fluent speech   LABORATORY DATA:  I have reviewed the data as listed    Latest Ref Rng & Units 03/28/2022   11:29 AM 12/25/2021   11:01 AM 06/28/2021   10:32 AM  CBC  WBC 4.0 - 10.5 K/uL 4.7  3.9  4.1   Hemoglobin 13.0 - 17.0 g/dL 13.8  14.4  13.1   Hematocrit 39.0 - 52.0 % 41.4  42.8  39.3   Platelets 150 - 400 K/uL 191  192  219         Latest Ref Rng & Units 03/28/2022   11:29 AM 03/26/2022   12:18 PM 12/25/2021   11:01 AM  CMP  Glucose 70 - 99 mg/dL 82   95   BUN 8 - 23 mg/dL 19   14   Creatinine 0.61 - 1.24 mg/dL 0.82  1.00  0.90   Sodium 135 - 145 mmol/L 142   141   Potassium 3.5 - 5.1 mmol/L 4.0   4.1   Chloride 98 - 111 mmol/L 106   106   CO2 22 - 32 mmol/L 32   32   Calcium 8.9 - 10.3 mg/dL 9.2   9.7   Total Protein 6.5 - 8.1 g/dL 7.1   7.1   Total Bilirubin 0.3 - 1.2 mg/dL 0.5   0.6   Alkaline Phos 38 - 126 U/L 97   78   AST 15 - 41 U/L 25   22   ALT 0 - 44 U/L 19   16       RADIOGRAPHIC STUDIES: I have personally reviewed the radiological images as listed and agreed with the findings in the report. No results found.    No orders of the defined types were placed in this encounter.  All questions were answered. The patient knows to call the clinic with any problems, questions or concerns. No barriers to learning was detected. The total time spent in the appointment was 3052  minutes.     Truitt Merle, MD 03/28/2022   Felicity Coyer, CMA, am acting as scribe for Truitt Merle, MD.   I have reviewed the above documentation for accuracy and completeness, and I agree with the above.

## 2022-03-28 ENCOUNTER — Encounter: Payer: Self-pay | Admitting: Hematology

## 2022-03-28 ENCOUNTER — Inpatient Hospital Stay (HOSPITAL_BASED_OUTPATIENT_CLINIC_OR_DEPARTMENT_OTHER): Payer: Medicare HMO | Admitting: Hematology

## 2022-03-28 ENCOUNTER — Other Ambulatory Visit: Payer: Self-pay

## 2022-03-28 ENCOUNTER — Inpatient Hospital Stay: Payer: Medicare HMO | Attending: Hematology

## 2022-03-28 VITALS — BP 131/71 | HR 73 | Temp 97.8°F | Resp 16 | Ht 67.0 in | Wt 140.9 lb

## 2022-03-28 DIAGNOSIS — Z9221 Personal history of antineoplastic chemotherapy: Secondary | ICD-10-CM | POA: Insufficient documentation

## 2022-03-28 DIAGNOSIS — C20 Malignant neoplasm of rectum: Secondary | ICD-10-CM

## 2022-03-28 DIAGNOSIS — N2889 Other specified disorders of kidney and ureter: Secondary | ICD-10-CM

## 2022-03-28 DIAGNOSIS — Z85048 Personal history of other malignant neoplasm of rectum, rectosigmoid junction, and anus: Secondary | ICD-10-CM | POA: Insufficient documentation

## 2022-03-28 DIAGNOSIS — Z923 Personal history of irradiation: Secondary | ICD-10-CM | POA: Diagnosis not present

## 2022-03-28 HISTORY — DX: Other specified disorders of kidney and ureter: N28.89

## 2022-03-28 LAB — CMP (CANCER CENTER ONLY)
ALT: 19 U/L (ref 0–44)
AST: 25 U/L (ref 15–41)
Albumin: 4.3 g/dL (ref 3.5–5.0)
Alkaline Phosphatase: 97 U/L (ref 38–126)
Anion gap: 4 — ABNORMAL LOW (ref 5–15)
BUN: 19 mg/dL (ref 8–23)
CO2: 32 mmol/L (ref 22–32)
Calcium: 9.2 mg/dL (ref 8.9–10.3)
Chloride: 106 mmol/L (ref 98–111)
Creatinine: 0.82 mg/dL (ref 0.61–1.24)
GFR, Estimated: >60 mL/min (ref >60)
Glucose, Bld: 82 mg/dL (ref 70–99)
Potassium: 4 mmol/L (ref 3.5–5.1)
Sodium: 142 mmol/L (ref 135–145)
Total Bilirubin: 0.5 mg/dL (ref 0.3–1.2)
Total Protein: 7.1 g/dL (ref 6.5–8.1)

## 2022-03-28 LAB — CBC WITH DIFFERENTIAL (CANCER CENTER ONLY)
Abs Immature Granulocytes: 0.01 10*3/uL (ref 0.00–0.07)
Basophils Absolute: 0 10*3/uL (ref 0.0–0.1)
Basophils Relative: 0 %
Eosinophils Absolute: 0.1 10*3/uL (ref 0.0–0.5)
Eosinophils Relative: 2 %
HCT: 41.4 % (ref 39.0–52.0)
Hemoglobin: 13.8 g/dL (ref 13.0–17.0)
Immature Granulocytes: 0 %
Lymphocytes Relative: 30 %
Lymphs Abs: 1.4 10*3/uL (ref 0.7–4.0)
MCH: 29.9 pg (ref 26.0–34.0)
MCHC: 33.3 g/dL (ref 30.0–36.0)
MCV: 89.6 fL (ref 80.0–100.0)
Monocytes Absolute: 0.5 10*3/uL (ref 0.1–1.0)
Monocytes Relative: 10 %
Neutro Abs: 2.7 10*3/uL (ref 1.7–7.7)
Neutrophils Relative %: 58 %
Platelet Count: 191 10*3/uL (ref 150–400)
RBC: 4.62 MIL/uL (ref 4.22–5.81)
RDW: 13 % (ref 11.5–15.5)
WBC Count: 4.7 10*3/uL (ref 4.0–10.5)
nRBC: 0 % (ref 0.0–0.2)

## 2022-03-28 NOTE — Assessment & Plan Note (Signed)
-  initially diagnosed in 05/2019 with locally advanced stage III rectal cancer and treated with total neoadjuvant chemotherapy FOLFOX 07/02/19-10/08/19 and concurrent chemoRT with Xeloda 1516m BID  11/02/19-12/07/19. -He [facility] -surveillance colonoscopy with Dr. KTherisa Doynein 09/2020 was benign -CT CAP 02/20/21 showed NED. -I personally reviewed his restaging scan from March 26, 2022, which showed no evidence of recurrence.  I discussed the incidental findings also. -He is clinically doing well, lab reviewed, exam was unremarkable, no clinical concern for recurrence.

## 2022-03-28 NOTE — Assessment & Plan Note (Signed)
-  CT scan since 2021 showed multiple renal cystic masses -Recent CT scan from February 2024 showed bilateral renal masses measuring 2.3 to 2.5 cm, overall stable from last scan, slightly enlarged since 2021. -I discussed this findings with patient, and will refer him to urology.

## 2022-04-24 ENCOUNTER — Telehealth: Payer: Self-pay | Admitting: Physician Assistant

## 2022-04-24 NOTE — Telephone Encounter (Signed)
I advised to contact PCP per Sharene Butters, PA-C

## 2022-04-24 NOTE — Telephone Encounter (Signed)
Pt's sister called in and left a message. She says the pt has been taking mirtazapine for insomnia and anxiety. It was prescribed by another doctor. He has been released by that doctor and she is wondering if Sharene Butters would continue prescribing it or if she thinks he should stop taking it?

## 2022-04-26 ENCOUNTER — Other Ambulatory Visit: Payer: Self-pay | Admitting: Family Medicine

## 2022-04-26 DIAGNOSIS — G479 Sleep disorder, unspecified: Secondary | ICD-10-CM

## 2022-04-26 DIAGNOSIS — G3184 Mild cognitive impairment, so stated: Secondary | ICD-10-CM

## 2022-04-26 DIAGNOSIS — R4586 Emotional lability: Secondary | ICD-10-CM

## 2022-07-09 ENCOUNTER — Encounter: Payer: Self-pay | Admitting: Hematology

## 2022-07-26 ENCOUNTER — Ambulatory Visit
Admission: RE | Admit: 2022-07-26 | Discharge: 2022-07-26 | Disposition: A | Discharge status: Home / Self Care | Payer: Medicare Other | Source: Ambulatory Visit | Attending: Family Medicine | Admitting: Family Medicine

## 2022-07-26 ENCOUNTER — Other Ambulatory Visit: Payer: Self-pay | Admitting: Family Medicine

## 2022-07-26 ENCOUNTER — Encounter: Payer: Self-pay | Admitting: Hematology

## 2022-07-26 DIAGNOSIS — T189XXA Foreign body of alimentary tract, part unspecified, initial encounter: Secondary | ICD-10-CM

## 2022-07-26 DIAGNOSIS — Z03821 Encounter for observation for suspected ingested foreign body ruled out: Secondary | ICD-10-CM | POA: Diagnosis not present

## 2022-07-26 DIAGNOSIS — G3184 Mild cognitive impairment, so stated: Secondary | ICD-10-CM

## 2022-07-26 DIAGNOSIS — R4586 Emotional lability: Secondary | ICD-10-CM

## 2022-07-26 DIAGNOSIS — G479 Sleep disorder, unspecified: Secondary | ICD-10-CM

## 2022-08-07 ENCOUNTER — Encounter: Payer: Self-pay | Admitting: Psychology

## 2022-08-07 DIAGNOSIS — F411 Generalized anxiety disorder: Secondary | ICD-10-CM | POA: Insufficient documentation

## 2022-08-08 ENCOUNTER — Ambulatory Visit: Payer: Medicare Other

## 2022-08-08 ENCOUNTER — Encounter: Payer: Self-pay | Admitting: Hematology

## 2022-08-08 ENCOUNTER — Encounter: Payer: Self-pay | Admitting: Psychology

## 2022-08-08 ENCOUNTER — Ambulatory Visit (INDEPENDENT_AMBULATORY_CARE_PROVIDER_SITE_OTHER): Payer: Medicare Other | Admitting: Psychology

## 2022-08-08 DIAGNOSIS — G40009 Localization-related (focal) (partial) idiopathic epilepsy and epileptic syndromes with seizures of localized onset, not intractable, without status epilepticus: Secondary | ICD-10-CM | POA: Diagnosis not present

## 2022-08-08 DIAGNOSIS — F03A0 Unspecified dementia, mild, without behavioral disturbance, psychotic disturbance, mood disturbance, and anxiety: Secondary | ICD-10-CM | POA: Insufficient documentation

## 2022-08-08 DIAGNOSIS — R4189 Other symptoms and signs involving cognitive functions and awareness: Secondary | ICD-10-CM

## 2022-08-08 HISTORY — DX: Unspecified dementia, mild, without behavioral disturbance, psychotic disturbance, mood disturbance, and anxiety: F03.A0

## 2022-08-08 NOTE — Progress Notes (Signed)
   Psychometrician Note   Cognitive testing was administered to Carlos [name] by Shan Levans, B.S. (psychometrist) under the supervision of Dr. Newman Nickels, Ph.D., licensed psychologist on 08/08/2022. Mr. Munier did not appear overtly distressed by the testing session per behavioral observation or responses across self-report questionnaires. Rest breaks were offered.    The battery of tests administered was selected by Dr. Newman Nickels, Ph.D. with consideration to Mr. Wandling current level of functioning, the nature of his symptoms, emotional and behavioral responses during interview, level of literacy, observed level of motivation/effort, and the nature of the referral question. This battery was communicated to the psychometrist. Communication between Dr. Newman Nickels, Ph.D. and the psychometrist was ongoing throughout the evaluation and Dr. Newman Nickels, Ph.D. was immediately accessible at all times. Dr. Newman Nickels, Ph.D. provided supervision to the psychometrist on the date of this service to the extent necessary to assure the quality of all services provided.    Carlos [name] will return within approximately 1-2 weeks for an interactive feedback session with Dr. Milbert Coulter at which time his test performances, clinical impressions, and treatment recommendations will be reviewed in detail. Mr. Pedregon understands he can contact our office should he require our assistance before this time.  A total of 140 minutes of billable time were spent face-to-face with Mr. Edmundson by the psychometrist. This includes both test administration and scoring time. Billing for these services is reflected in the clinical report generated by Dr. Newman Nickels, Ph.D.  This note reflects time spent with the psychometrician and does not include test scores or any clinical interpretations made by Dr. Milbert Coulter. The full report will follow in a separate note.

## 2022-08-08 NOTE — Progress Notes (Signed)
NEUROPSYCHOLOGICAL EVALUATION [place] Department of Neurology  Date of Evaluation: August 08, 2022  Reason for Referral:   Carlos [name] is a 77 y.o. right-handed African-American male referred by Carlos Kays, PA-C, to characterize his current cognitive functioning and assist with diagnostic clarity and treatment planning in the context of a prior mild neurocognitive disorder diagnosis and concerns surrounding progressive cognitive decline.   Assessment and Plan:   Clinical Impression(s): Carlos [name]' pattern of performance is suggestive of severe impairment surrounding both delayed retrieval and recognition/consolidation aspects of memory. Additional weaknesses were exhibited across processing speed, complex attention, and fine motor coordination bilaterally. Performance variability was exhibited across executive functioning and confrontation naming. Performances were appropriate relative to age-matched peers across basic attention, safety/judgment, receptive language, verbal fluency (both phonemic and semantic), visuospatial abilities, and encoding (i.e., learning) aspects of memory. Carlos [name] denied difficulties completing instrumental activities of daily living (ADLs) independently. However, I question that he has full appreciation into the extent of functional limitations. Medical records suggest his sister fully manages all medication, financial, and bill paying responsibilities and that there have been navigation-related driving concerns in the past. Carlos [name] also has a home health aide visit him regularly. While he described this as a "luxury," this would appear more necessary that he is able to realize. Given cognitive and functional dysfunction, I believe that he best meets diagnostic criteria for a Major Neurocognitive Disorder ("dementia") at the present time.  Relative to his previous evaluation in December 2020, a primary decline was exhibited across  delayed retrieval/recognition aspects of visual memory despite different instrumentation. Mild decline was also exhibited across processing speed and response inhibition. Very subtle decline across semantic fluency remains a possibility; however, scores remained normatively appropriate overall. All other assessed domains exhibited relative stability. This includes severe verbal memory impairment seen across both evaluations.   The etiology for his likely mild dementia presentation remains difficult to pinpoint. Across memory testing, despite being able to learn information well, he was nearly amnestic (i.e., 0% to 22% retention rates) across all memory tasks. He also exhibited very poor performance across yes/no recognition trials. Taken together, this suggests rapid forgetting and a prominent storage impairment, both of which are hallmark signs of Alzheimer's disease. Variability across confrontation naming would follow typical disease progression. Semantic fluency and visuospatial abilities remained normatively appropriate, which is encouraging. Also encouraging is the fact that Carlos [name]' profile exhibited a good deal of stability given that 3.5 years had passed relative to his previous evaluation. If Alzheimer's disease is present and driving cognitive and functional decline, it would appear to still be in earlier stages and progressing quite slowly at the present time.   Outside of neurodegenerative causes, Carlos [name] does have a prior seizure history. Testing does suggest some greater left temporal dysfunction assuming normal brain lateralization in this right-handed individual. 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 However, I am unsure if his seizures were ever formally localized to that brain  region by medical professionals in the past. His most recent brain MRI did not show any distinct areas of insult or encephalomalacia and this would likely not explain visual memory decline over time or greater functional reliance on others. Overall, my concerns lay with the presence of a neurodegenerative illness rather than a significant impact from  prior seizure activity. However, a mixed dementia presentation remains plausible. Continued medical monitoring will be important moving forward.   Recommendations: Carlos [name] has already been prescribed medication aimed to address memory loss and concerns surrounding Alzheimer's disease (i.e., donepezil/Aricept and memantine/Namenda). He is encouraged to continue taking this medication as prescribed. It is important to highlight that this medication has been shown to slow functional decline in some individuals. There is no current treatment which can stop or reverse cognitive decline when caused by a neurodegenerative illness.   Given cognitive impairment and family-held concerns surrounding driving safety and navigation, I would recommend that Carlos [name] abstain from driving pending the results of a formal driving evaluation. Should his family wish to pursue a formalized driving evaluation, they could reach out to the following agencies: [employer] in [place]: [phone] [facility]: [phone] [facility]: [phone] [facility]: [phone] or [phone]  It will be important for Carlos [name] to have another person with him when in situations where he may need to process information, weigh the pros and cons of different options, and make decisions, in order to ensure that he fully understands and recalls all information to be considered.  If not already done, Carlos [name] and his family may want to discuss his wishes regarding durable power of attorney and medical decision making, so that he can have  input into these choices. If they require legal assistance with this, long-term care resource access, or other aspects of estate planning, they could reach out to [employer] at [phone] for a free consultation. Additionally, they may wish to discuss future plans for caretaking and seek out community options for in home/residential care should they become necessary.  Mr. Olm is encouraged to attend to lifestyle factors for brain health (e.g., regular physical exercise, good nutrition habits and consideration of the MIND-DASH diet, regular participation in cognitively-stimulating activities, and general stress management techniques), which are likely to have benefits for both emotional adjustment and cognition. Optimal control of vascular risk factors (including safe cardiovascular exercise and adherence to dietary recommendations) is encouraged. Continued participation in activities which provide mental stimulation and social interaction is also recommended.   Important information should be provided to Mr. Rihn in written format in all instances. This information should be placed in a highly frequented and easily visible location within his home to promote recall. External strategies such as written notes in a consistently used memory journal, visual and nonverbal auditory cues such as a calendar on the refrigerator or appointments with alarm, such as on a cell phone, can also help maximize recall.  To address problems with processing speed, he may wish to consider:   -Ensuring that he is alerted when essential material or instructions are being presented   -Adjusting the speed at which new information is presented   -Allowing for more time in comprehending, processing, and responding in conversation   -Repeating and paraphrasing instructions or conversations aloud  To address problems with fluctuating attention and/or executive dysfunction, he may wish to consider:   -Avoiding external  distractions when needing to concentrate   -Limiting exposure to fast paced environments with multiple sensory demands   -Writing down complicated information and using checklists   -Attempting and completing one task at a time (i.e., no multi-tasking)   -Verbalizing aloud each step of a task to maintain focus   -Taking frequent breaks during the completion of steps/tasks to avoid fatigue   -Reducing the amount of information considered  at one time   -Scheduling more difficult activities for a time of day where he is usually most alert  Review of Records:   Mr. Vasas was seen by [facility] Patrcia Dolly, M.D.) on 11/07/2018 for an evaluation of memory loss and frequent falls. At that time, Mr. Shirah stated his belief that his memory is reasonable. He stated that his family has noticed more changes and they ask him things and "I don't give good answers." He did acknowledge short-term memory changes. His sister started noticing changes a year ago where he would forget conversations. He would also forget if he had already finished a task (e.g., that he had already come from the grocery store). She felt that symptoms had worsened over the past 5-6 months. Last month, he was visiting another sister and forgot the last portion of the trip, did not get to his destination, and ultimately just went home. He has told family he has gotten lost coming home from work. His family is also concerned about an increase in the frequency of falls. He has had 3 falls in the past 6 months. He reports injuring his foot and hitting his head with a fall in April, then hurting his back in July. He thinks it is due to clumsiness. Performance on a brief cognitive screener (MOCA) was 22/30. Points were lost across the following domains: visuospatial/executive (3/5), verbal fluency (0/1), and delayed recall (0/5). Ultimately, Mr. Stream was referred for a comprehensive neuropsychological evaluation to characterize his cognitive  abilities and to assist with diagnostic clarity and treatment planning.  He completed a comprehensive neuropsychological evaluation with myself on 01/23/2019. Results suggested primary impairments surrounding retrieval/consolidation aspects of verbal memory, working memory, confrontation naming, and complex visuoconstructional abilities. Additional variability was seen across processing speed and executive functioning. Visual encoding (i.e., learning) was also a relative weakness. Performances were appropriate across basic attention, receptive language, verbal fluency, basic visuoconstructional abilities, verbal encoding, and visual retrieval/consolidation aspects of memory. He was working at the time and denied prominent ADL disruption. As such, he was diagnosed with a mild neurocognitive disorder. The etiology for cognitive dysfunction was unclear. There was evidence to suggest some left temporal dysfunction given his remote seizure history with ongoing AED treatment. The very early stages of an illness such as Alzheimer's disease could not be ruled out. However, not all primary characteristics were exhibited at that time. Repeat testing was recommended.   He most recently met with [facility] Carlos [name], [place]) on 03/23/2022 for follow-up of memory concerns and cognitive dysfunction. Primary memory concerns were said to surround repetition in conversation, as well as trouble remembering recent conversations and names. He also reported some misplacing of objects and entering rooms and forgetting his original intention. Functionally, records suggest that his sister is in charge of medication management, financial management, and bill paying responsibilities. Performance on a brief cognitive screening instrument (MMSE) was 26/30. Ultimately, Mr. Baldini was referred for a repeat neuropsychological evaluation to characterize his cognitive abilities and to assist with diagnostic clarity and treatment  planning.    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 Brain MRI on 09/11/2019 was stable. Brain MRI on 12/09/2019 was also stable. Head CT on 07/03/2020 was negative. No more recent neuroimaging was available for review.   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   Back pain 01/21/2020   Balance problem 12/15/2021   Benign prostatic hyperplasia 03/30/2013   Bilateral posterior capsular opacification 12/22/2021   Closed comminuted intertrochanteric fracture of left femur    Combined forms of age-related cataract of left eye 12/22/2021   Combined forms of age-related cataract of right eye 12/22/2021   Gastroesophageal reflux 12/15/2021   Generalized anxiety disorder    Hypoalbuminemia due to protein-calorie malnutrition    Left hip pain    Localization-related idiopathic epilepsy and epileptic syndromes with seizures of localized onset, not intractable, without status epilepticus 10/26/2020   Low TSH level 12/25/2021   Macular puckering, 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   Malignant neoplasm of prostate 02/02/2020   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/23/2019   Port-A-Cath in place 09/11/2019   Posterior vitreous detachment of both eyes 12/22/2021   Pressure ulcer, stage 2 12/12/2019   Recurrent falls 01/21/2020   Renal mass 03/28/2022    Past Surgical History:  Procedure Laterality Date   COLON SURGERY     HIP SURGERY     INTRAMEDULLARY (IM) NAIL INTERTROCHANTERIC Left 12/11/2019   Procedure: INTRAMEDULLARY (IM) NAIL INTERTROCHANTRIC;  Surgeon: Samson Frederic, MD;  Location: [facility] OR;  Service: Orthopedics;  Laterality: Left;   IR REMOVAL TUN ACCESS W/ PORT W/O FL MOD SED  07/29/2020   PORTACATH PLACEMENT N/A 07/01/2019    Procedure: PORT ULTRASOUND GUIDED PLACEMENT;  Surgeon: Ovidio Kin, MD;  Location: [facility] ORS;  Service: General;  Laterality: N/A;   PROSTATE SURGERY  2004   RECTAL SURGERY     TONSILECTOMY, ADENOIDECTOMY, BILATERAL MYRINGOTOMY AND TUBES      Current Outpatient Medications:    dicyclomine (BENTYL) 20 MG tablet, Take 20 mg by mouth 3 (three) times daily as needed., Disp: , Rfl:    donepezil (ARICEPT) 5 MG tablet, Take half tablet 5 mg daily, Disp: 30 tablet, Rfl: 3   famotidine (PEPCID) 20 MG tablet, Take 1 tablet (20 mg total) by mouth 2 (two) times daily as needed., Disp: 90 tablet, Rfl: 0   levETIRAcetam (KEPPRA) 1000 MG tablet, Take 1 tablet twice a day (take with Levetiracetam 250mg  tablet for total of 1250mg  twice a day), Disp: 180 tablet, Rfl: 1   levETIRAcetam (KEPPRA) 250 MG tablet, TAKE 5 TABLETS BY MOUTH TWICE DAILY, Disp: 900 tablet, Rfl: 3   loperamide (IMODIUM) 2 MG capsule, Take 1 capsule (2 mg total) by mouth every 6 (six) hours as needed for diarrhea or loose stools., Disp: 30 capsule, Rfl: 0   memantine (NAMENDA) 5 MG tablet, Take 1 tablet (5 mg total) by mouth at bedtime., Disp: 30 tablet, Rfl: 11   methylcellulose (CITRUCEL) oral powder, Take by mouth., Disp: , Rfl:    mirtazapine (REMERON) 15 MG tablet, Take one tablet (15 mg) BY MOUTH AT BEDTIME FOR INSOMNIA, Disp: 30 tablet, Rfl: 2   Multiple Vitamin (MULTIVITAMIN WITH MINERALS) TABS tablet, Take 1 tablet by mouth daily., Disp: , Rfl:    NON FORMULARY, Take 1 capsule by mouth daily. Mind works, Disp: , Rfl:   Clinical Interview:   The following information was obtained during a clinical interview with Mr. Linn prior to cognitive testing.  Cognitive Symptoms: Decreased short-term memory: Largely denied. He did acknowledge some difficulties recalling names of some individuals from time to time. At the time of his previous evaluation in December 2020, his sister expressed several concerns surrounding him getting lost while  driving or exhibiting navigational difficulties,  misplacing items around the home, occasionally forgetting the details of previous conversations, and repeating himself. Per recent medical records, these difficulties have persisted to present day, in addition to increased repetition in conversation. Currently, Mr. Desautel did not report his perception of progressive decline. Per his sister and medical documentation, memory concerns have been present since around 2018-2019 and have gradually worsened over that time.  Decreased long-term memory: Denied. Decreased attention/concentration: Endorsed. He alluded to longstanding difficulties with sustained focus and notable distractibility. Mr. Pullian was very repetitive during the current interview in emphasizing that these are longstanding difficulties that have been present for 50 years and have remained unchanged.  Reduced processing speed: Denied.  Difficulties with executive functions: Endorsed. Longstanding difficulties with disorganization and complex planning were previously endorsed. He continued to highlight this area of functioning as his primary concern and area of potential impairment. Previously, his sister agreed with this, and at one point described his living arrangement as similar to a hoarding situation where items are not placed in drawers or where they should be placed. Personality changes or poor judgment were denied.  Difficulties with emotion regulation: Denied. Difficulties with receptive language: Denied. Difficulties with word finding: Denied. Decreased visuoperceptual ability: Denied.   Difficulties completing ADLs: Mr. Kopke denied all concerns. However, he did highlight that he has an aide come to his home to provide assistance with medication management and other functional abilities. He described this individual as his "backup," noting that this is "a luxury" rather than something that is necessary to help him function well. Medical  records suggest that his sister manages medication management, financial management, and bill paying responsibilities. He continues to drive. Previous medical records have expressed concern surrounding navigational decline and prior instances of getting lost.  Additional Medical History: History of traumatic brain injury/concussion: Denied. Medical records suggest a history of several recent falls, including one in April 2020 where Mr. Grabenstein hit his head. A loss in consciousness was denied, as well as persisting post-concussion symptoms. No more recent head injuries were reported. History of stroke: Denied. History of seizure activity: Endorsed (see above). He continues to take AEDs and denied any recent seizure activity to his knowledge.  History of known exposure to toxins: Denied. Symptoms of chronic pain: Denied. Experience of frequent headaches/migraines: Denied. Frequent instances of dizziness/vertigo: Denied.   Sensory changes: Mr. Confer wears glasses with positive effect. Other sensory changes/difficulties (e.g., hearing, taste, or smell) were denied. Balance/coordination difficulties: Endorsed. Mr. Lazer described his balance as "pretty good." His sister noted a history of several falls dating back to this Spring, including some where he will seemingly fall asleep while sitting on the edge of the bed, leading to him falling out. Medical records suggest that Mr. Mcdorman was seen at an urgent care center on 08/24/2018 due to one of these falls causing back pain. Other motor difficulties: Denied.  Sleep History: Estimated hours obtained each night: 6-8 hours. Mr. Ledyard continues to work third shift for the [employer] and previously identified having an irregular sleep schedule as a result.  Difficulties falling asleep: Denied. Difficulties staying asleep: Endorsed. He alluded to variable difficulties staying asleep throughout the night and may wake for unknown reasons or to use the restroom.  Feels  rested and refreshed upon awakening: Endorsed.    History of snoring: Denied. History of waking up gasping for air: Denied. Witnessed breath cessation while asleep: Denied.   History of vivid dreaming: Denied. Excessive movement while asleep: Denied. Instances of acting out his  dreams: Denied.  Psychiatric/Behavioral Health History: Depression: Denied. He described his current mood as "pretty good." He had previously alluded to some distress, generally surrounding him feeling as though he needs to get more stuff done, but is largely unable due to difficulties with organization. He denied to his knowledge any prior mental health concerns or formal diagnoses. His sister previously reported that he had worked with an individual therapist in the distant past. However, the reason for this was unclear. Current or remote suicidal ideation, intent, or plan were denied. Anxiety: Denied. Mania: Denied. Trauma History: Denied. Visual/auditory hallucinations: Denied. Delusional thoughts: Denied.   Tobacco: Denied. Alcohol: Mr. Nardone denied current alcohol consumption as well as a history of problematic alcohol abuse or dependence. Recreational drugs: Denied.  Family History: Problem Relation Age of Onset   Osteoporosis Mother    Dementia Mother    Diabetes Mother    Brain cancer Father    This information was confirmed by Mr. Greenough.  Academic/Vocational History: Highest level of educational attainment: 16 years. Mr. Lunder earned a Bachelor's degree in history. He described himself as a poor student in academic settings and took longer than normal to earn his college degree. Mathematics was described as a relative weakness. History of developmental delay: Denied. History of grade repetition: Endorsed. He reported needing to repeat the 3rd grade for an unknown/unspecified reason. Enrollment in special education courses: Denied. History of LD/ADHD: Denied.   Employment: Mr. 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  Evaluation Results:   Behavioral Observations: Mr. Savala was unaccompanied, arrived to his appointment on time, and was appropriately dressed and groomed. He appeared alert. Observed gait and station were within normal limits. Gross motor functioning appeared intact upon informal observation and no abnormal movements (e.g., tremors) were noted. His affect was generally relaxed and positive. Spontaneous speech was fluent and word finding difficulties were not observed during the clinical interview. He was extremely repetitive throughout the interview, describing his position at the [employer] or his longstanding difficulties with organization and attention many times over a relatively brief period of time. He did not appear to appreciate the extent of his repetition. Thought processes were otherwise coherent, organized, and normal in content. Insight into his cognitive difficulties appeared poor and I do question if he has a full appreciation of the extent of ongoing cognitive impairment and functional decline.   During testing, he was noted to be very repetitive, often telling the same stories throughout the evaluation without apparent awareness that all of this had been previously shared. Sustained attention was appropriate. He exhibited some confusion across more complex tasks, often requiring instruction repetition. Task engagement was adequate and he persisted when challenged. He exhibited an odd performance across a very basic numerical sequencing task (TMT A) where he appeared unable to accurately count from 1-25 in succession. However, surprisingly, he then had less difficulty comprehending a more complex sequencing task (TMT B). Overall, Mr. Sarra was cooperative with the clinical interview and subsequent testing procedures.   Adequacy of Effort: The validity of neuropsychological testing is limited by the extent to which the individual being tested may  be assumed to have exerted adequate effort during testing. Mr. Chimento expressed his intention to perform to the best of his abilities and exhibited adequate task engagement and persistence. Scores across stand-alone and embedded performance validity measures were within expectation. As such, the results of the current evaluation are believed to be a valid representation of Mr. Albu'  current cognitive functioning.  Test Results: Mr. Ando was fully oriented at the time of the current evaluation.  Intellectual abilities based upon educational and vocational attainment were estimated to be in the below average to average range. Premorbid abilities were estimated to be within the lower limits of the average range based upon a single-word reading test.   Processing speed was exceptionally low to below average. Basic attention was average. More complex attention (e.g., working memory) was well below average to below average. Executive functioning was variable, ranging from the exceptionally low to average normative ranges. He performed in the average range across a task assessing safety and judgment.   Assessed receptive language abilities were average. Likewise, Mr. Watson did not exhibit any difficulties comprehending task instructions and answered all questions asked of him appropriately. Assessed expressive language was mildly variable. Phonemic fluency was average, semantic fluency was below average to average, and confrontation naming was average across a screening task but well below average across a more comprehensive task.      Assessed visuospatial/visuoconstructional abilities were average.    Learning (i.e., encoding) of novel verbal information was below average to average. Spontaneous delayed recall (i.e., retrieval) of previously learned information was exceptionally low. Retention rates were 22% across a story learning task, 0% across a list learning task, and 6% across a figure drawing task.  Performance across recognition tasks was exceptionally low to well below average, suggesting negligible evidence for information consolidation.  Fine motor coordination and speed was well below average when using his dominant (right) hand and exceptionally low when using his non-dominant hand.    Results of emotional screening instruments suggested that recent symptoms of generalized anxiety were in the minimal range, while symptoms of depression were within normal limits. A screening instrument assessing recent sleep quality suggested the presence of minimal sleep dysfunction.  Tables of Scores:   Note: This summary of test scores accompanies the interpretive report and should not be considered in isolation without reference to the appropriate sections in the text. Descriptors are based on appropriate normative data and may be adjusted based on clinical judgment. Terms such as "Within Normal Limits" and "Outside Normal Limits" are used when a more specific description of the test score cannot be determined. Descriptors refer to the current evaluation only.         Percentile - Normative Descriptor > 98 - Exceptionally High 91-97 - Well Above Average 75-90 - Above Average 25-74 - Average 9-24 - Below Average 2-8 - Well Below Average < 2 - Exceptionally Low        Orientation: December 2020 Current     Raw Score Raw Score Percentile   NAB Orientation, Form 1 29/29 29/29 --- ---        Cognitive Screening:       Raw Score Raw Score Percentile   SLUMS: --- 17/30 --- ---        RBANS, Form A: Standard Score/ Scaled Score Standard Score/ Scaled Score Percentile   Total Score --- 78 7 Well Below Average  Immediate Memory --- 83 13 Below Average    List Learning --- 6 9 Below Average    Story Memory --- 8 25 Average  Visuospatial/Constructional --- 105 63 Average    Figure Copy --- 10 50 Average    Line Orientation --- 18/20 51-75 Average  Language --- 90 25 Average    Picture Naming  --- 10/10 51-75 Average    Semantic Fluency --- 6 9 Below Average  Attention --- 88 21 Below Average    Digit Span --- 10 50 Average    Coding --- 6 9 Below Average  Delayed Memory --- 44 <1 Exceptionally Low    List Recall --- 0/10 <2 Exceptionally Low    List Recognition --- 13/20 <2 Exceptionally Low    Story Recall --- 3 1 Exceptionally Low    Story Recognition --- 7/12 5-7 Well Below Average    Figure Recall --- 2 <1 Exceptionally Low    Figure Recognition --- 3/8 4-8 Well Below Average         Intellectual Functioning:       Standard Score Standard Score Percentile   Test of Premorbid Functioning: 94 90 25 Average        Attention/Executive Function:      Trail Making Test (TMT): Raw Score (T Score) Raw Score (T Score) Percentile     Part A 44 secs.,  0 errors (49) 100 secs.,  2 errors (26) 1 Exceptionally Low    Part B 192 secs.,  1 error (42) 160 secs.,  1 error (44) 27 Average          Scaled Score Scaled Score Percentile   WAIS-IV Digit Span: --- 7 16 Below Average    Forward --- 10 50 Average    Backward -- 5 5 Well Below Average    Sequencing --- 6 9 Below Average         Scaled Score Scaled Score Percentile   WAIS-IV Similarities: --- 11 63 Average        D-KEFS Color-Word Interference Test: Raw Score (Scaled Score) Raw Score (Scaled Score) Percentile     Color Naming 42 secs. (7) 49 secs. (4) 2 Well Below Average    Word Reading 41 secs. (2) 41 secs. (2) <1 Exceptionally Low    Inhibition 87 secs. (8) 98 secs. (6) 9 Below Average      Total Errors 2 errors (11) 0 errors (13) 84 Above Average    Inhibition/Switching 126 secs. (4) 137 secs. (2) <1 Exceptionally Low      Total Errors 6 errors (7) 12 errors (1) <1 Exceptionally Low        D-KEFS Verbal Fluency Test: Raw Score (Scaled Score) Raw Score (Scaled Score) Percentile     Letter Total Correct 27 (8) 24 (7) 16 Below Average    Category Total Correct 35 (11) 29 (8) 25 Average    Category Switching  Total Correct 9 (6) 10 (8) 25 Average    Category Switching Accuracy 7 (6) 9 (8) 25 Average      Total Set Loss Errors 1 (11) 2 (10) 50 Average      Total Repetition Errors 14 (1) 9 (4) 2 Well Below Average        NAB Executive Functions Module, Form 1: T Score T Score Percentile     Judgment --- 49 46 Average        Language:      Verbal Fluency Test: Raw Score (T Score) Raw Score (T Score) Percentile     Phonemic Fluency (FAS) 27 (41) 24 (39) 14 Below Average    Animal Fluency 20 (56) 15 (51) 54 Average         NAB Language Module, Form 1: T Score T Score Percentile     Auditory Comprehension 56 56 73 Average    Naming 25/31 (24) 26/31 (29) 2 Well Below Average        Visuospatial/Visuoconstruction:  Raw Score Raw Score Percentile   Clock Drawing: 8/10 8/10 --- Within Normal Limits         Scaled Score Scaled Score Percentile   WAIS-IV Matrix Reasoning: 8 9 37 Average        Sensory-Motor:      [place] Grooved Pegboard Test: Raw Score Raw Score Percentile     Dominant Hand --- 175 secs.,  1 drop 2 Well Below Average    Non-Dominant Hand --- 324 secs.,  5 drops  1 Exceptionally Low        Mood and Personality:       Raw Score Raw Score Percentile   Geriatric Depression Scale: 11 5 --- Within Normal Limits  Geriatric Anxiety Scale: 11 7 --- Minimal    Somatic 1 1 --- Minimal    Cognitive 4 2 --- Minimal    Affective 6 4 --- Mild        Additional Questionnaires:       Raw Score Raw Score Percentile   PROMIS Sleep Disturbance Questionnaire: 12 10 --- None to Slight   Informed Consent and Coding/Compliance:   The current evaluation represents a clinical evaluation for the purposes previously outlined by the referral source and is in no way reflective of a forensic evaluation.   Mr. Nhan was provided with a verbal description of the nature and purpose of the present neuropsychological evaluation. Also reviewed were the foreseeable risks and/or discomforts and  benefits of the procedure, limits of confidentiality, and mandatory reporting requirements of this provider. The patient was given the opportunity to ask questions and receive answers about the evaluation. Oral consent to participate was provided by the patient.   This evaluation was conducted by Newman Nickels, Ph.D., ABPP-CN, board certified clinical neuropsychologist. Mr. Sauvageau completed a clinical interview with Dr. Milbert Coulter, billed as one unit [phone], and 140 minutes of cognitive testing and scoring, billed as one unit [phone] and four additional units [id number]. Psychometrist Shan Levans, B.S., assisted Dr. Milbert Coulter with test administration and scoring procedures. As a separate and discrete service, one unit [id number] and two units [phone] were billed for Dr. Tammy Sours time spent in interpretation and report writing.

## 2022-08-13 ENCOUNTER — Encounter: Payer: Self-pay | Admitting: Psychology

## 2022-08-16 ENCOUNTER — Ambulatory Visit (INDEPENDENT_AMBULATORY_CARE_PROVIDER_SITE_OTHER): Payer: Medicare Other | Admitting: Psychology

## 2022-08-16 DIAGNOSIS — G40009 Localization-related (focal) (partial) idiopathic epilepsy and epileptic syndromes with seizures of localized onset, not intractable, without status epilepticus: Secondary | ICD-10-CM | POA: Diagnosis not present

## 2022-08-16 DIAGNOSIS — F03A0 Unspecified dementia, mild, without behavioral disturbance, psychotic disturbance, mood disturbance, and anxiety: Secondary | ICD-10-CM

## 2022-08-16 NOTE — Progress Notes (Signed)
   Neuropsychology Feedback Session [name]. [facility] [place] Department of Neurology  Reason for Referral:   [name] is a 77 y.o. right-handed African-American male referred by [name], PA-C, to characterize his current cognitive functioning and assist with diagnostic clarity and treatment planning in the context of a prior mild neurocognitive disorder diagnosis and concerns surrounding progressive cognitive decline.   Feedback:   Mr. [name] completed a comprehensive neuropsychological evaluation on 08/08/2022. Please refer to that encounter for the full report and recommendations. Briefly, results suggested severe impairment surrounding both delayed retrieval and recognition/consolidation aspects of memory. Additional weaknesses were exhibited across processing speed, complex attention, and fine motor coordination bilaterally. Performance variability was exhibited across executive functioning and confrontation naming. Relative to his previous evaluation in December 2020, a primary decline was exhibited across delayed retrieval/recognition aspects of visual memory despite different instrumentation. Mild decline was also exhibited across processing speed and response inhibition. Very subtle decline across semantic fluency remains a possibility; however, scores remained normatively appropriate overall. The etiology for his likely mild dementia presentation remains difficult to pinpoint. Across memory testing, despite being able to learn information well, he was nearly amnestic (i.e., 0% to 22% retention rates) across all memory tasks. He also exhibited very poor performance across yes/no recognition trials. Taken together, this suggests rapid forgetting and a prominent storage impairment, both of which are hallmark signs of Alzheimer's disease. Variability across confrontation naming would follow typical disease progression. Semantic fluency and visuospatial abilities remained  normatively appropriate, which is encouraging. Also encouraging is the fact that [name]' profile exhibited a good deal of stability given that 3.5 years had passed relative to his previous evaluation. If Alzheimer's disease is present and driving cognitive and functional decline, it would appear to still be in earlier stages and progressing quite slowly at the present time.   [name] was accompanied by his sisters during the current feedback session. Content of the current session focused on the results of his neuropsychological evaluation. [name] was given the opportunity to ask questions and his questions were answered. He was encouraged to reach out should additional questions arise. A copy of his report was provided at the conclusion of the visit.      One unit [phone] was billed for Dr. Tammy Sours time spent preparing for, conducting, and documenting the current feedback session with Mr. Peary.

## 2022-08-25 ENCOUNTER — Encounter: Payer: Self-pay | Admitting: Hematology

## 2022-09-17 ENCOUNTER — Encounter: Payer: Self-pay | Admitting: Physician Assistant

## 2022-09-17 ENCOUNTER — Encounter: Payer: Self-pay | Admitting: Hematology

## 2022-09-17 ENCOUNTER — Ambulatory Visit (INDEPENDENT_AMBULATORY_CARE_PROVIDER_SITE_OTHER): Payer: Medicare Other | Admitting: Physician Assistant

## 2022-09-17 VITALS — BP 137/72 | HR 77 | Resp 18 | Ht 67.0 in | Wt 141.0 lb

## 2022-09-17 DIAGNOSIS — F03A0 Unspecified dementia, mild, without behavioral disturbance, psychotic disturbance, mood disturbance, and anxiety: Secondary | ICD-10-CM

## 2022-09-17 DIAGNOSIS — G40009 Localization-related (focal) (partial) idiopathic epilepsy and epileptic syndromes with seizures of localized onset, not intractable, without status epilepticus: Secondary | ICD-10-CM

## 2022-09-17 MED ORDER — DONEPEZIL HCL 5 MG PO TABS: ORAL_TABLET | ORAL | 3 refills | Status: DC

## 2022-09-17 NOTE — Patient Instructions (Addendum)
It was a pleasure to see you today at our office.   Recommendations:  Meds: Follow up in Feb 12 at 11:30  Increase donepezil  5 mg daily.   Start memantine 5 mg at night  Continue Keppra as directed   Recommend Psychiatric evaluation for ADD/ADHD       RECOMMENDATIONS FOR ALL PATIENTS WITH MEMORY PROBLEMS: 1. Continue to exercise (Recommend 30 minutes of walking everyday, or 3 hours every week) 2. Increase social interactions - continue going to [place] and enjoy social gatherings with friends and family 3. Eat healthy, avoid fried foods and eat more fruits and vegetables 4. Maintain adequate blood pressure, blood sugar, and blood cholesterol level. Reducing the risk of stroke and cardiovascular disease also helps promoting better memory. 5. Avoid stressful situations. Live a simple life and avoid aggravations. Organize your time and prepare for the next day in anticipation. 6. Sleep well, avoid any interruptions of sleep and avoid any distractions in the bedroom that may interfere with adequate sleep quality 7. Avoid sugar, avoid sweets as there is a strong link between excessive sugar intake, diabetes, and cognitive impairment We discussed the Mediterranean diet, which has been shown to help patients reduce the risk of progressive memory disorders and reduces cardiovascular risk. This includes eating fish, eat fruits and green leafy vegetables, nuts like almonds and hazelnuts, walnuts, and also use olive oil. Avoid fast foods and fried foods as much as possible. Avoid sweets and sugar as sugar use has been linked to worsening of memory function.  There is always a concern of gradual progression of memory problems. If this is the case, then we may need to adjust level of care according to patient needs. Support, both to the patient and caregiver, should then be put into place.    The Alzheimer's Association is here all day, every day for people facing Alzheimer's disease through our free  24/7 Helpline: 224-640-6205. The Helpline provides reliable information and support to all those who need assistance, such as individuals living with memory loss, Alzheimer's or other dementia, caregivers, health care professionals and the public.  Our highly trained and knowledgeable staff can help you with: Understanding memory loss, dementia and Alzheimer's  Medications and other treatment options  General information about aging and brain health  Skills to provide quality care and to find the best care from professionals  Legal, financial and living-arrangement decisions Our Helpline also features: Confidential care consultation provided by master's level clinicians who can help with decision-making support, crisis assistance and education on issues families face every day  Help in a caller's preferred language using our translation service that features more than 200 languages and dialects  Referrals to local community programs, services and ongoing support     FALL PRECAUTIONS: Be cautious when walking. Scan the area for obstacles that may increase the risk of trips and falls. When getting up in the mornings, sit up at the edge of the bed for a few minutes before getting out of bed. Consider elevating the bed at the head end to avoid drop of blood pressure when getting up. Walk always in a well-lit room (use night lights in the walls). Avoid area rugs or power cords from appliances in the middle of the walkways. Use a walker or a cane if necessary and consider physical therapy for balance exercise. Get your eyesight checked regularly.  FINANCIAL OVERSIGHT: Supervision, especially oversight when making financial decisions or transactions is also recommended.  HOME SAFETY: Consider the safety  of the kitchen when operating appliances like stoves, microwave oven, and blender. Consider having supervision and share cooking responsibilities until no longer able to participate in those. Accidents  with firearms and other hazards in the house should be identified and addressed as well.   ABILITY TO BE LEFT ALONE: If patient is unable to contact 911 operator, consider using LifeLine, or when the need is there, arrange for someone to stay with patients. Smoking is a fire hazard, consider supervision or cessation. Risk of wandering should be assessed by caregiver and if detected at any point, supervision and safe proof recommendations should be instituted.  MEDICATION SUPERVISION: Inability to self-administer medication needs to be constantly addressed. Implement a mechanism to ensure safe administration of the medications.   DRIVING: Regarding driving, in patients with progressive memory problems, driving will be impaired. We advise to have someone else do the driving if trouble finding directions or if minor accidents are reported. Independent driving assessment is available to determine safety of driving.   If you are interested in the driving assessment, you can contact the following:  [employer] in [place] [phone]  [facility] [phone]  [facility] [phone] or [phone]      Mediterranean Diet A Mediterranean diet refers to food and lifestyle choices that are based on the traditions of countries located on the [employer]. This way of eating has been shown to help prevent certain conditions and improve outcomes for people who have chronic diseases, like kidney disease and heart disease. What are tips for following this plan? Lifestyle  Cook and eat meals together with your family, when possible. Drink enough fluid to keep your urine clear or pale yellow. Be physically active every day. This includes: Aerobic exercise like running or swimming. Leisure activities like gardening, walking, or housework. Get 7-8 hours of sleep each night. If recommended by your health care provider,  drink red wine in moderation. This means 1 glass a day for nonpregnant women and 2 glasses a day for men. A glass of wine equals 5 oz (150 mL). Reading food labels  Check the serving size of packaged foods. For foods such as rice and pasta, the serving size refers to the amount of cooked product, not dry. Check the total fat in packaged foods. Avoid foods that have saturated fat or trans fats. Check the ingredients list for added sugars, such as corn syrup. Shopping  At the grocery store, buy most of your food from the areas near the walls of the store. This includes: Fresh fruits and vegetables (produce). Grains, beans, nuts, and seeds. Some of these may be available in unpackaged forms or large amounts (in bulk). Fresh seafood. Poultry and eggs. Low-fat dairy products. Buy whole ingredients instead of prepackaged foods. Buy fresh fruits and vegetables in-season from local farmers markets. Buy frozen fruits and vegetables in resealable bags. If you do not have access to quality fresh seafood, buy precooked frozen shrimp or canned fish, such as tuna, salmon, or sardines. Buy small amounts of raw or cooked vegetables, salads, or olives from the deli or salad bar at your store. Stock your pantry so you always have certain foods on hand, such as olive oil, canned tuna, canned tomatoes, rice, pasta, and beans. Cooking  Cook foods with extra-virgin olive oil instead of using butter or other vegetable oils. Have meat as a side dish, and have vegetables or grains as your main dish. This means having meat in  small portions or adding small amounts of meat to foods like pasta or stew. Use beans or vegetables instead of meat in common dishes like chili or lasagna. Experiment with different cooking methods. Try roasting or broiling vegetables instead of steaming or sauteing them. Add frozen vegetables to soups, stews, pasta, or rice. Add nuts or seeds for added healthy fat at each meal. You can add  these to yogurt, salads, or vegetable dishes. Marinate fish or vegetables using olive oil, lemon juice, garlic, and fresh herbs. Meal planning  Plan to eat 1 vegetarian meal one day each week. Try to work up to 2 vegetarian meals, if possible. Eat seafood 2 or more times a week. Have healthy snacks readily available, such as: Vegetable sticks with hummus. Greek yogurt. Fruit and nut trail mix. Eat balanced meals throughout the week. This includes: Fruit: 2-3 servings a day Vegetables: 4-5 servings a day Low-fat dairy: 2 servings a day Fish, poultry, or lean meat: 1 serving a day Beans and legumes: 2 or more servings a week Nuts and seeds: 1-2 servings a day Whole grains: 6-8 servings a day Extra-virgin olive oil: 3-4 servings a day Limit red meat and sweets to only a few servings a month What are my food choices? Mediterranean diet Recommended Grains: Whole-grain pasta. Brown rice. Bulgar wheat. Polenta. Couscous. Whole-wheat bread. Orpah Cobb. Vegetables: Artichokes. Beets. Broccoli. Cabbage. Carrots. Eggplant. Green beans. Chard. Kale. Spinach. Onions. Leeks. Peas. Squash. Tomatoes. Peppers. Radishes. Fruits: Apples. Apricots. Avocado. Berries. Bananas. Cherries. Dates. Figs. Grapes. Lemons. Melon. Oranges. Peaches. Plums. Pomegranate. Meats and other protein foods: Beans. Almonds. Sunflower seeds. Pine nuts. Peanuts. Cod. Salmon. Scallops. Shrimp. Tuna. Tilapia. Clams. Oysters. Eggs. Dairy: Low-fat milk. Cheese. Greek yogurt. Beverages: Water. Red wine. Herbal tea. Fats and oils: Extra virgin olive oil. Avocado oil. Grape seed oil. Sweets and desserts: [place] yogurt with honey. Baked apples. Poached pears. Trail mix. Seasoning and other foods: Basil. Cilantro. Coriander. Cumin. Mint. Parsley. Sage. Rosemary. Tarragon. Garlic. Oregano. Thyme. Pepper. Balsalmic vinegar. Tahini. Hummus. Tomato sauce. Olives. Mushrooms. Limit these Grains: Prepackaged pasta or rice dishes.  Prepackaged cereal with added sugar. Vegetables: Deep fried potatoes (french fries). Fruits: Fruit canned in syrup. Meats and other protein foods: Beef. Pork. Lamb. Poultry with skin. Hot dogs. Tomasa Blase. Dairy: Ice cream. Sour cream. Whole milk. Beverages: Juice. Sugar-sweetened soft drinks. Beer. Liquor and spirits. Fats and oils: Butter. Canola oil. Vegetable oil. Beef fat (tallow). Lard. Sweets and desserts: Cookies. Cakes. Pies. Candy. Seasoning and other foods: Mayonnaise. Premade sauces and marinades. The items listed may not be a complete list. Talk with your dietitian about what dietary choices are right for you. Summary The Mediterranean diet includes both food and lifestyle choices. Eat a variety of fresh fruits and vegetables, beans, nuts, seeds, and whole grains. Limit the amount of red meat and sweets that you eat. Talk with your health care provider about whether it is safe for you to drink red wine in moderation. This means 1 glass a day for nonpregnant women and 2 glasses a day for men. A glass of wine equals 5 oz (150 mL). This information is not intended to replace advice given to you by your health care provider. Make sure you discuss any questions you have with your health care provider. Document Released: 09/15/2015 Document Revised: 10/18/2015 Document Reviewed: 09/15/2015 Elsevier Interactive Patient Education  2017 [employer].

## 2022-09-17 NOTE — Progress Notes (Signed)
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 RH male with a history of GAD, possible ADD, and mild dementia, likely negatively stages, likely due to Alzheimer's disease without behavioral disturbance per neuropsych evaluation on July 2024, seen today in follow up for memory loss. Patient is currently on memantine 5 mg twice daily and donepezil 2.5 mg daily (due to GI, this was being administered cautiously).  Given the formal diagnosis, discussed with the family increasing donepezil to a normal dose, and family agreed.  He still able to participate in all his ADLs without difficulty, and continues to drive without becoming lost, although the caregiver did say that the driving at times is somewhat aggressive.  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     Follow up in 6 months. Continue memantine 5 mg twice daily and donepezil 5 mg daily, side effects discussed Recommend psychiatry for evaluation of GAD and ADD Continue Keppra as directed for history of seizures Recommend no further driving. Recommend good control of her cardiovascular risk factors Continue to control mood as per PCP     Subjective:    This patient is accompanied in the office by his caregiver ad his sitter  who supplements the history.  Previous records as well as any outside records available were reviewed prior to todays visit. Patient was last seen on 03/23/2022 MMSE 26/30    Any changes in memory since last visit? " STM may be a little changed".  He continues to have some difficulty remembering recent conversations, new information and may asked the same thing within 20 mins.  He enjoys reading the [employer] and [employer].  He does not like to do crossword puzzles. repeats oneself?  Endorsed. Disoriented when walking into a room?  Patient denies    Leaving objects in unusual places?  May misplace things  but not in unusual places   Wandering behavior?  denies   Any personality changes since last visit?  His sister reports possible undiagnosed ADD-ADHD, still to be evaluated by psychiatry soon. Any worsening depression?:  Denies.   Hallucinations or paranoia?  Denies.   Seizures?  Denies any recent seizures.  Denies aura, taste changes, dizziness or body jerks.  Any sleep changes?  Sleeps better with mirtazapine for insomnia, denies vivid dreams, REM behavior or sleepwalking   Sleep apnea?   Denies.   Any hygiene concerns? Denies.  Independent of bathing and dressing?  Endorsed  Does the patient needs help with medications?  Sister  is in charge  Who is in charge of the finances?  Sister  is in charge  Any changes in appetite?  denies    Patient have trouble swallowing? Denies.   Does the patient cook? No Any headaches?   denies   Chronic back pain  denies   Ambulates with difficulty?  He walks around the house, but is afraid of walking alone in the street.   Recent falls or head injuries? denies     Unilateral weakness, numbness or tingling? denies   Any tremors?  Denies   Any anosmia?  Denies   Any incontinence of urine?  Endorsed, wears diapers Any bowel dysfunction?  Occasional diarrhea, he follows GI oncology.    Patient lives alone with caregivers 6 times a week Monday through Saturday from 10-2 Does the patient drive?   Yes, his caregiver reports that sometimes his  driving is aggressive.  He denies getting lost.  Neuropsych evaluation 08/2022 Briefly, results suggested severe impairment surrounding both delayed retrieval and recognition/consolidation aspects of memory. Additional weaknesses were exhibited across processing speed, complex attention, and fine motor coordination bilaterally. Performance variability was exhibited across executive functioning and confrontation naming. Relative to his previous evaluation in December 2020, a primary decline was exhibited across delayed  retrieval/recognition aspects of visual memory despite different instrumentation. Mild decline was also exhibited across processing speed and response inhibition. Very subtle decline across semantic fluency remains a possibility; however, scores remained normatively appropriate overall. The etiology for his likely mild dementia presentation remains difficult to pinpoint. Across memory testing, despite being able to learn information well, he was nearly amnestic (i.e., 0% to 22% retention rates) across all memory tasks. He also exhibited very poor performance across yes/no recognition trials. Taken together, this suggests rapid forgetting and a prominent storage impairment, both of which are hallmark signs of Alzheimer's disease. Variability across confrontation naming would follow typical disease progression. Semantic fluency and visuospatial abilities remained normatively appropriate, which is encouraging. Also encouraging is the fact that Mr. Charrier' profile exhibited a good deal of stability given that 3.5 years had passed relative to his previous evaluation. If Alzheimer's disease is present and driving cognitive and functional decline, it would appear to still be in earlier stages and progressing quite slowly at the present time.    History on Initial Assessment 11/07/2018: This is a pleasant 77 year old right-handed man with no significant past medical history presenting for evaluation of memory loss and frequent falls. His sister is present to provide additional information. He thinks his memory is reasonable. He states family has noticed more changes, they ask him things and "I don't give good answers." He endorses short-term memory changes. He lives with his 84 year old mother who has 4 caregivers. 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 He denies getting lost driving. He denies any missed bill payments. He is not on any medications. He denies any difficulties at work, he states it is routine work.  His sister started noticing changes a year ago, he would forget conversations. He would forget he had already finished a task, for instance that he had already came from the grocery store. She feels symptoms worsened in the past 5-6 months. Last month, he was visiting another sister and forgot the last portion of the trip, he did not get to his destination and just went home. He has told family he has gotten lost coming home from work, he arrived home an hour later (he has been in the same place for 7 years). His family is also concerned about an increase in frequency of falls. He has had 3 falls in the past 6 months. He reports injuring his foot and hitting his head with a fall in April, then hurting his back in July. He thinks it is due to clumsiness. No loss of consciousness. They are concerned about the possibility of a brain tumor, their father had a brain tumor. There is a history of memory loss in his mother and younger brother (secondary to stroke). He denies any history of significant head injuries or alcohol use.    He denies any headaches, dizziness, diplopia, dysarthria, dysphagia, neck/back pain, focal numbness/tingling/weakness, bowel/bladder dysfunction, anosmia, or tremors. He felt unwell last night and BP was 151/86, he went home early from work. He states he is on his feet 12 hours a day, and he is now  required to work 6 days a week (mandatory due to Covid-19 changes), he was previously working 5 days a week. His sister is concerned he is not eating well, mostly sugars and hotdogs. He is overly tired and sleepy some days. Mood is good, he has "always been hyper, but now it more" per sister. No hallucinations or paranoia.    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       PREVIOUS MEDICATIONS:    CURRENT MEDICATIONS:  Outpatient Encounter Medications as of 09/17/2022  Medication Sig   atorvastatin (LIPITOR) 10 MG tablet 1 tablet Orally Once a day for 90 days   famotidine (PEPCID) 20 MG tablet Take 1 tablet (20 mg total) by mouth 2 (two) times daily as needed.   levETIRAcetam (KEPPRA) 1000 MG tablet Take 1 tablet twice a day (take with Levetiracetam 250mg  tablet for total of 1250mg  twice a day)   levETIRAcetam (KEPPRA) 250 MG tablet TAKE 5 TABLETS BY MOUTH TWICE DAILY   loperamide (IMODIUM) 2 MG capsule Take 1 capsule (2 mg total) by mouth every 6 (six) hours as needed for diarrhea or loose stools.   methylcellulose (CITRUCEL) oral powder Take by mouth.   mirtazapine (REMERON) 15 MG tablet Take one tablet (15 mg) BY MOUTH AT BEDTIME FOR INSOMNIA   Multiple Vitamin (MULTIVITAMIN WITH MINERALS) TABS tablet Take 1 tablet by mouth daily.   NON FORMULARY Take 1 capsule by mouth daily. Mind works   dicyclomine (BENTYL) 20 MG tablet Take 20 mg by mouth 3 (three) times daily as needed. (Patient not taking: Reported on 09/17/2022)   donepezil (ARICEPT) 5 MG tablet Take one tablet 5 mg daily   memantine (NAMENDA) 5 MG tablet Take 1 tablet (5 mg total) by mouth at bedtime.   [DISCONTINUED] donepezil (ARICEPT) 5 MG tablet Take half tablet 5 mg daily   No facility-administered encounter medications on file as of 09/17/2022.       03/23/2022   10:00 AM 09/20/2021    2:00 PM 06/06/2020   10:00 AM  MMSE - Mini Mental State Exam  Orientation to time 4 5 5   Orientation to Place 5 5 5   Registration 3 3 3   Attention/ Calculation 5 5 3   Recall 0 0 2  Language- name 2 objects 2 2 2   Language- repeat 1 1 1   Language- follow 3 step command 3 3 3   Language- read & follow direction 1 1 1   Write a sentence 1 1 1   Copy design 1 1 0  Total score 26 27 26       10/25/2020    3:00 PM 11/07/2018    9:00 AM  Montreal Cognitive Assessment   Visuospatial/ Executive (0/5) 2 3  Naming (0/3) 3 3  Attention:  Read list of digits (0/2) 1 2  Attention: Read list of letters (0/1) 1 1  Attention: Serial 7 subtraction starting at 100 (0/3) 3 3  Language: Repeat phrase (0/2) 2 2  Language : Fluency (0/1) 1 0  Abstraction (0/2) 2 2  Delayed Recall (0/5) 0 0  Orientation (0/6) 6 6  Total 21 22  Adjusted Score (based on education) 21 22    Objective:     PHYSICAL EXAMINATION:    VITALS:   Vitals:   09/17/22 1121  BP: (!) 160/80  Pulse: 77  Resp: 18  SpO2: 99%  Weight: 141 lb (64 kg)  Height: 5\' 7"  (1.702 m)    GEN:  The patient appears stated age and is in NAD. HEENT:  Normocephalic, atraumatic.   Neurological examination:  General: NAD, well-groomed, appears stated age. Orientation: The patient is alert.  Anxious appearing oriented to person, place and date Cranial nerves: There is good facial symmetry.The speech is fluent and clear. No aphasia or dysarthria. Fund of knowledge is appropriate. Recent and remote memory are impaired. Attention and concentration are reduced.  Able to name objects and repeat phrases.  Hearing is intact to conversational tone.  Sensation: Sensation is intact to light touch throughout Motor: Strength is at least antigravity x4. DTR's 2/4 in UE/LE     Movement examination: Tone: There is normal tone in the UE/LE Abnormal movements:  no tremor.  No myoclonus.  No asterixis.   Coordination:  There is no decremation with RAM's. Normal finger to nose  Gait and Station: The patient has no difficulty arising out of a deep-seated chair without the use of the hands. The patient's stride length is good.  Gait is cautious and narrow.    Thank you for allowing [place] the opportunity to participate in the care of this nice patient. Please do not hesitate to contact us for any questions or concerns.   Total time spent on today's visit was 35 minutes dedicated to this patient today, preparing to see patient, examining the patient, ordering tests and/or medications and  counseling the patient, documenting clinical information in the EHR or other health record, independently interpreting results and communicating results to the patient/family, discussing treatment and goals, answering patient's questions and coordinating care.  Cc:  Farris Has, MD  [name] 09/17/2022 11:51 AM

## 2022-09-18 DIAGNOSIS — K6289 Other specified diseases of anus and rectum: Secondary | ICD-10-CM | POA: Diagnosis not present

## 2022-09-18 DIAGNOSIS — Z85048 Personal history of other malignant neoplasm of rectum, rectosigmoid junction, and anus: Secondary | ICD-10-CM | POA: Diagnosis not present

## 2022-09-19 ENCOUNTER — Telehealth: Payer: Self-pay | Admitting: Physician Assistant

## 2022-09-19 NOTE — Telephone Encounter (Signed)
Advised of [name], PA-C. Notes

## 2022-09-19 NOTE — Telephone Encounter (Signed)
Patients sister is calling to get clarify medication change . 10mg  tab 1/2 once at night , wertman wanted him up the medication  "Donepezil"

## 2022-09-24 ENCOUNTER — Ambulatory Visit: Payer: Medicare HMO | Admitting: Physician Assistant

## 2022-09-24 NOTE — Progress Notes (Signed)
Patient Care Team: Farris Has, MD as PCP - General (Family Medicine) Van Clines, MD as Consulting Physician (Neurology) Malachy Mood, MD as Consulting Physician (Hematology) Kerin Salen, MD as Consulting Physician (Gastroenterology) Elwyn Reach (Neurology) Soundra Pilon, OD as Referring Physician (Optometry)   CHIEF COMPLAINT: Follow-up rectal cancer  Oncology History Overview Note  Cancer Staging Rectal adenocarcinoma [facility]) Staging form: Colon and Rectum, AJCC 8th Edition - Clinical stage from 06/12/2019: cT4, cN1 - Unsigned Stage prefix: Initial diagnosis - Pathologic stage from 02/08/2020: Stage I (ypT1, pN0, cM0) - Signed by Malachy Mood, MD on 04/06/2020 Stage prefix: Post-therapy Total positive nodes: 0 Residual tumor (R): R0 - None    Adenocarcinoma of rectum  05/29/2019 Procedure   Colonoscopy per Dr. Marca Ancona A digital rectal exam was normal  frond-like fungating infiltrative non-obstructing large mass in the rectum. The mass was partially circumferential, measured 5 cm in length. The colonoscopy also showed 11 sessile polyps in the transverse and asecnding colon and hepatic flexure.    05/29/2019 Initial Biopsy   Pathology on the rectal biopsy showed at least intramucosal adenocarcinoma involving tubulovillous adenoma with high grade dysplasia.   06/05/2019 Imaging   CT CAP IMPRESSION: 1. Cholelithiasis and gallbladder wall thickening with equivocal pericholecystic inflammation. Acute cholecystitis not excluded. If there is clinical suspicion for acute cholecystitis, recommend ultrasound evaluation. 2. Approximately 5 cm irregular mass within the distal sigmoid colon with possible extension through the wall. No definite abnormal adjacent lymph nodes. 3. Mild omental haziness-nonspecific. Metastatic disease not excluded. 4. Indeterminate 3-4 mm RIGHT pulmonary nodules which could represent metastatic disease. These are too small for PET CT evaluation.  Consider CT follow-up. 5. 2.5 cm irregular cystic mass within the RIGHT kidney. Elective MRI recommended for further evaluation. 6. 4 mm nonobstructing LEFT LOWER pole renal calculus. 7. Coronary artery disease. 8. Aortic Atherosclerosis (ICD10-I70.0).   06/12/2019 Imaging   Staging MRI pelvis  FINDINGS: TUMOR LOCATION Tumor distance from Anal Verge/Skin Surface:  11.4 cm Tumor distance to Internal Anal Sphincter: 6.5 cm TUMOR DESCRIPTION Circumferential Extent: 100% Tumor Length: 8.3 cm T - CATEGORY Extension through Muscularis Propria: Yes>17mm=T3d Shortest Distance of any tumor/node from Mesorectal Fascia: 0 mm, along the left lateral and posterior walls (tumor abuts the left lateral pelvic sidewall and sacrum) Extramural Vascular Invasion/Tumor Thrombus: No Invasion of Anterior Peritoneal Reflection: No Involvement of Adjacent Organs or Pelvic Sidewall: Yes, involving the left lateral pelvic sidewall =T4 Levator Ani Involvement: No N - CATEGORY Mesorectal Lymph Nodes >=65mm: 2=N1 Extra-mesorectal Lymphadenopathy: No Other:  None. IMPRESSION: Rectal adenocarcinoma T stage: T4 Rectal adenocarcinoma N stage:  N1 Distance from tumor to the internal anal sphincter is 6.5 cm.   06/18/2019 Initial Diagnosis   Rectal adenocarcinoma (HCC)   07/02/2019 - 10/08/2019 Chemotherapy   Total Neoadjuvant FOLFOX q2weeks starting 07/02/19-10/08/19,  8 cycles   07/13/2019 PET scan   IMPRESSION: 1. Known rectal mass is intensely hypermetabolic compatible with primary rectal adenocarcinoma. No findings of FDG avid nodal metastasis or distant metastatic disease. 2. Hyperdense kidney cysts are identified bilaterally favored to represent hemorrhagic cyst. 3. Nonobstructing left renal calculi. 4. Aortic atherosclerosis, in addition to lad coronary artery disease. Please note that although the presence of coronary artery calcium documents the presence of coronary artery disease, the severity of this  disease and any potential stenosis cannot be assessed on this non-gated CT examination. Assessment for potential risk factor modification, dietary therapy or pharmacologic therapy may be warranted, if clinically indicated. Aortic  Atherosclerosis (ICD10-I70.0). 5. Gallstones.   09/11/2019 - 09/14/2019 Hospital Admission   Acute AMS, seizure 09/11/19 -Developed acute altered mental status day after Cycle 6 chemo (09/11/19).  -He was admitted for work up, EEG showed epileptic spike. ID work up negative, MRI negative for metastasis.    09/11/2019 Imaging   MRI Brain  IMPRESSION: Incomplete study. Images obtained reveal no acute abnormality. Negative for acute infarct.   Atrophy and mild ventricular enlargement stable from prior studies.   09/11/2019 Imaging   CT head  IMPRESSION: Mild ventricular prominence with normal appearing sulci. Question early communicating hydrocephalus. Brain parenchyma appears unremarkable. No acute infarct. No mass or hemorrhage.   Foci of arterial vascular calcification noted. There is mucosal thickening in several ethmoid air cells.   There is probable cerumen in the right external auditory canal.     09/23/2019 Imaging   CT AP  IMPRESSION: 1. Solid-appearing left eccentric upper rectal mass measures about 6.7 by 2.9 cm. This is difficult to compare directly to the prior exam given differences in orientation and degree of distension of the rectum. There is some prominence of stool in the sigmoid colon and rectum on today's examination, but the mass is not obstructive. 2. Other imaging findings of potential clinical significance: Cholelithiasis. Complex bilateral renal lesions are similar to prior exams. Nonobstructive left nephrolithiasis. Mild prostatomegaly with nodular prominence the upper margin of the prostate gland. Lumbar spondylosis and degenerative disc disease causing multilevel foraminal impingement. 3. Aortic atherosclerosis.   Aortic  Atherosclerosis (ICD10-I70.0).   11/02/2019 - 12/09/2019 Chemotherapy   Concurrent chemo RT with Oral Xeloda 1500mg  BID on days of RT starting 11/02/19-12/09/19   11/02/2019 - 12/09/2019 Radiation Therapy   Concurrent chemo RT by Dr Mitzi Hansen with Xeloda  starting 11/02/19-12/09/19   01/13/2020 Imaging   MRI at [facility] 1.  Reduced size of the previously demonstrated high rectal mass. The residual tumor measures 2.4 cm in maximal diameter and is primarily endoluminal. There is a similar amount of extramural disease spread into the left mesorectal space and presacral space.  2.  Improved appearance of lymphadenopathy with several conspicuous mesorectal and internal iliac lymph nodes.  3.  Small volume of nonspecific free fluid in the pelvis.   01/29/2020 Imaging   CT CAP  IMPRESSION: 1. Interval response to therapy of rectosigmoid junction primary. 2. No findings of metastatic disease within the chest, abdomen, or pelvis. 3. Small pulmonary nodules, similar and decreased as detailed above. Nonspecific. 4. Right-sided Port-A-Cath tip at high IVC. consider repositioning. 5. Cholelithiasis. 6. Left nephrolithiasis. 7. Bilateral renal lesions. A medial upper pole renal lesion is suspicious for renal cell carcinoma. This could be re-evaluated at follow-up or more entirely characterized with routine outpatient pre and post contrast abdominal MRI.   02/08/2020 Surgery   LOWER ANTERIOR RESECTION RECTUM LAPAROSCOPIC by Dr Byrd Hesselbach at [facility]   Procedures   PR LAP,SURG,COLECTOMY, PARTIAL, W/ANAST   PR LAP, SURG MOBIL SPLENIC FL DUR PTL COLECTOMY   PR LAP, SURG ILEO/JEJUNO-STOMY   SIGMOID COLECTOMY LAPAROSCOPIC ASSISTED     02/08/2020 Pathology Results   Final Pathologic Diagnosis at [facility]     A.  LYMPH NODE, "IMA", EXCISION:              Two lymph nodes are negative for metastatic carcinoma (0/2).   B.  SIGMOID COLON AND RECTUM, LOWER ANTERIOR RESECTION: Residual well differentiated invasive  adenocarcinoma of the rectum, arising from a tubulovillous adenoma with high grade dyplasia.  Size: 2 mm in greatest dimension.  Tumor invades the submucosa.  Negative for perineural and lymphovascular invasion. Resection margins are negative for malignancy or dysplasia.  Twenty-two lymph nodes are negative for metastatic carcinoma (0/22). Complete mesorectum.               See CAP cancer protocol summary and comment.   C.  PROXIMAL DOUGHNUT, RESECTION:                      Segment of benign colonic tissue.              Negative for malignancy or dyplasia.    D.  DISTAL DOUGHNUT, RESECTION:                            Segment of benign colonic tissue.              Negative for malignancy or dyplasia.    PATHOLOGIC STAGE CLASSIFICATION (pTNM, AJCC 8th Edition)         TNM Descriptors:    y (post-treatment)     Primary Tumor (pT):    pT1     Regional Lymph Nodes (pN):    pN0      02/08/2020 Cancer Staging   Staging form: Colon and Rectum, AJCC 8th Edition - Pathologic stage from 02/08/2020: Stage I (ypT1, pN0, cM0) - Signed by Malachy Mood, MD on 04/06/2020 Stage prefix: Post-therapy Total positive nodes: 0 Residual tumor (R): R0 - None   07/05/2020 Imaging   CT C/A/P w/o contrast  IMPRESSION: 1. Mildly indistinct appearance of the fat and gallbladder wall separation in the upper abdomen, of uncertain significance and in the setting of abundant gallstones in the gallbladder lumen. Correlate with any clinical signs of abdominal pain and consider further evaluation with ultrasound as warranted. 2. RIGHT-sided Port-A-Cath tip at the high IVC near the diaphragmatic hiatus. Could consider repositioning as warranted. 3. 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 Consider follow-up evaluation, renal protocol study with with MRI with and without contrast as outlined in previous imaging. Hemorrhagic and/or proteinaceous cysts elsewhere in the  kidneys. 4. LEFT nephrolithiasis as before with lower pole calculus approximately 6 mm. 5. No signs of metastatic disease with abundant stool in the colon following partial colonic resection. Correlate with any signs of constipation. 6. 4 mm nodule in the superior segment of the RIGHT lower lobe is unchanged. Attention on follow-up 7. Small midline periumbilical hernia containing fat. 8. Aortic atherosclerosis.   03/26/2022 Imaging    IMPRESSION: No evidence of recurrent or metastatic colon carcinoma.   Stable 2.3 cm solid right renal mass. Renal cell carcinoma cannot be excluded.   Cholelithiasis. No radiographic evidence of cholecystitis.   Colonic diverticulosis. No radiographic evidence of diverticulitis.   Stable mildly enlarged prostate.      CURRENT THERAPY: Surveillance  INTERVAL HISTORY Mr. Wittman returns for follow-up as scheduled, last seen by Dr. Mosetta Putt 03/28/2022. Here with health aid who stays with him 4 hours daily Mon - Sat. He continues to work 4 pm - midnight shift. Doing well in general.  Bowel habits fluctuate depending on diet.. Certain foods trigger diarrhea such as high sugar or carbs, and he is dairy intolerant.  He also has medication induced constipation, but overall managing well.  Denies abdominal pain/bloating, black or bloody stools, unintentional weight loss, or significant fatigue.  ROS  All other systems reviewed and negative  Past Medical History:  Diagnosis Date   Abnormality of gait 02/01/2020   Acute blood loss anemia    Acute encephalopathy 09/11/2019   Adenocarcinoma of rectum 06/18/2019   Back pain 01/21/2020   Balance problem 12/15/2021   Benign prostatic hyperplasia 03/30/2013   Bilateral posterior capsular opacification 12/22/2021   Closed comminuted intertrochanteric fracture of left femur    Combined forms of age-related cataract of left eye 12/22/2021   Combined forms of age-related cataract of right eye 12/22/2021    Gastroesophageal reflux 12/15/2021   Generalized anxiety disorder    Hypoalbuminemia due to protein-calorie malnutrition    Left hip pain    Localization-related idiopathic epilepsy and epileptic syndromes with seizures of localized onset, not intractable, without status epilepticus 10/26/2020   Low TSH level 12/25/2021   Macular puckering, 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   Malignant neoplasm of prostate 02/02/2020   Mild dementia, uncertain etiology 08/08/2022   Port-A-Cath in place 09/11/2019   Posterior vitreous detachment of both eyes 12/22/2021   Pressure ulcer, stage 2 12/12/2019   Recurrent falls 01/21/2020   Renal mass 03/28/2022     Past Surgical History:  Procedure Laterality Date   COLON SURGERY     HIP SURGERY     INTRAMEDULLARY (IM) NAIL INTERTROCHANTERIC Left 12/11/2019   Procedure: INTRAMEDULLARY (IM) NAIL INTERTROCHANTRIC;  Surgeon: Samson Frederic, MD;  Location: [facility] OR;  Service: Orthopedics;  Laterality: Left;   IR REMOVAL TUN ACCESS W/ PORT W/O FL MOD SED  07/29/2020   PORTACATH PLACEMENT N/A 07/01/2019   Procedure: PORT ULTRASOUND GUIDED PLACEMENT;  Surgeon: Ovidio Kin, MD;  Location: [facility] ORS;  Service: General;  Laterality: N/A;   PROSTATE SURGERY  2004   RECTAL SURGERY     TONSILECTOMY, ADENOIDECTOMY, BILATERAL MYRINGOTOMY AND TUBES       Outpatient Encounter Medications as of 09/25/2022  Medication Sig   atorvastatin (LIPITOR) 10 MG tablet 1 tablet Orally Once a day for 90 days   donepezil (ARICEPT) 5 MG tablet Take one tablet 5 mg daily   levETIRAcetam (KEPPRA) 1000 MG tablet Take 1 tablet twice a day (take with Levetiracetam 250mg  tablet for total of 1250mg  twice a day)   levETIRAcetam (KEPPRA) 250 MG tablet TAKE 5 TABLETS BY MOUTH TWICE DAILY   loperamide (IMODIUM) 2 MG capsule Take 1 capsule (2 mg total) by mouth every 6 (six) hours as needed for diarrhea or loose stools.   memantine (NAMENDA) 5 MG tablet Take 1 tablet (5 mg total) by mouth at bedtime.    methylcellulose (CITRUCEL) oral powder Take by mouth.   mirtazapine (REMERON) 15 MG tablet Take one tablet (15 mg) BY MOUTH AT BEDTIME FOR INSOMNIA   Multiple Vitamin (MULTIVITAMIN WITH MINERALS) TABS tablet Take 1 tablet by mouth daily.   NON FORMULARY Take 1 capsule by mouth daily. Mind works   dicyclomine (BENTYL) 20 MG tablet Take 20 mg by mouth 3 (three) times daily as needed. (Patient not taking: Reported on 09/17/2022)   famotidine (PEPCID) 20 MG tablet Take 1 tablet (20 mg total) by mouth 2 (two) times daily as needed.   No facility-administered encounter medications on file as of 09/25/2022.     Today's Vitals   09/25/22 1131 09/25/22 1133  BP:  (!) 142/67  Pulse:  67  Resp:  16  Temp:  98.1 F (36.7 C)  TempSrc:  Oral  SpO2:  100%  Weight:  140 lb 8 oz (63.7 kg)  PainSc: 0-No pain    Body mass index is  22.01 kg/m.   PHYSICAL EXAM GENERAL:alert, no distress and comfortable SKIN: no rash  EYES: sclera clear NECK: comedone to posterior base of neck, without mass  LYMPH:  no palpable cervical, supraclavicular, or inguinal lymphadenopathy  LUNGS: clear with normal breathing effort HEART: regular rate & rhythm, no lower extremity edema ABDOMEN: abdomen soft, non-tender and normal bowel sounds NEURO: alert & oriented x 3 with fluent speech  CBC    Component Value Date/Time   WBC 3.9 (L) 09/25/2022 1109   WBC 14.1 (H) 07/03/2020 1858   RBC 4.69 09/25/2022 1109   HGB 13.8 09/25/2022 1109   HCT 42.0 09/25/2022 1109   PLT 201 09/25/2022 1109   MCV 89.6 09/25/2022 1109   MCH 29.4 09/25/2022 1109   MCHC 32.9 09/25/2022 1109   RDW 12.8 09/25/2022 1109   LYMPHSABS 1.2 09/25/2022 1109   MONOABS 0.2 09/25/2022 1109   EOSABS 0.0 09/25/2022 1109   BASOSABS 0.0 09/25/2022 1109     CMP     Component Value Date/Time   NA 141 09/25/2022 1109   K 3.8 09/25/2022 1109   CL 106 09/25/2022 1109   CO2 30 09/25/2022 1109   GLUCOSE 113 (H) 09/25/2022 1109   BUN 15 09/25/2022  1109   CREATININE 0.89 09/25/2022 1109   CALCIUM 9.5 09/25/2022 1109   PROT 7.2 09/25/2022 1109   ALBUMIN 4.5 09/25/2022 1109   AST 24 09/25/2022 1109   ALT 23 09/25/2022 1109   ALKPHOS 77 09/25/2022 1109   BILITOT 0.4 09/25/2022 1109   GFRNONAA >60 09/25/2022 1109   GFRAA >60 11/09/2019 0947     ASSESSMENT & PLAN: 77 yo male with    1. Adenocarcinoma of the rectum, cT4N1M0 stage III -Diagnosed in 05/2019 with locally advanced stage III rectal cancer  -Completed neoadjuvant chemo with 8 cycles of FOLFOX from 5/2 7/21-10/08/2019.  He started concurrent chemo RT with Xeloda on 11/02/2019 - 12/07/19 -S/p LAR at [facility] 02/08/20, path showed ypT1 pN0, clear margins, no high risk features such as perforation, LVI, or PNI -Surveillance colonoscopy 09/2020 was benign, 3-year recall -Surveillance CTs showed NED, last done 03/2022. Repeat as clinically indicated at this point -Mr. Mansberger is clinically doing well, no concerns, exam is benign and labs are unremarkable. Overall no clinical concern for recurrence -He is 3 years from definitive surgery, I reviewed the recurrence risk has decreased. Continue surveillance -I reviewed s/sx to notify, including unintentional weight loss, change in bowel habits, black or bloody stools, abdominal pain/bloating, or significant fatigue -Follow-up in 6 months, or sooner if needed    PLAN: -Labs reviewed -Continue colorectal cancer surveillance -Follow-up in 6 months, or sooner if needed -Colonoscopy 09/2023 -Reviewed signs and symptoms to notify ie unintentional weight loss, change in bowel habits, black or bloody stools, abdominal pain/bloating, or significant fatigue    All questions were answered. The patient knows to call the clinic with any problems, questions or concerns. No barriers to learning were detected.   [name], NP-C 09/25/2022

## 2022-09-25 ENCOUNTER — Encounter: Payer: Self-pay | Admitting: Nurse Practitioner

## 2022-09-25 ENCOUNTER — Inpatient Hospital Stay: Payer: Medicare Other | Attending: Nurse Practitioner

## 2022-09-25 ENCOUNTER — Inpatient Hospital Stay (HOSPITAL_BASED_OUTPATIENT_CLINIC_OR_DEPARTMENT_OTHER): Payer: Medicare Other | Admitting: Nurse Practitioner

## 2022-09-25 VITALS — BP 142/67 | HR 67 | Temp 98.1°F | Resp 16 | Wt 140.5 lb

## 2022-09-25 DIAGNOSIS — C20 Malignant neoplasm of rectum: Secondary | ICD-10-CM | POA: Insufficient documentation

## 2022-09-25 LAB — CMP (CANCER CENTER ONLY)
ALT: 23 U/L (ref 0–44)
AST: 24 U/L (ref 15–41)
Albumin: 4.5 g/dL (ref 3.5–5.0)
Alkaline Phosphatase: 77 U/L (ref 38–126)
Anion gap: 5 (ref 5–15)
BUN: 15 mg/dL (ref 8–23)
CO2: 30 mmol/L (ref 22–32)
Calcium: 9.5 mg/dL (ref 8.9–10.3)
Chloride: 106 mmol/L (ref 98–111)
Creatinine: 0.89 mg/dL (ref 0.61–1.24)
GFR, Estimated: >60 mL/min (ref >60)
Glucose, Bld: 113 mg/dL — ABNORMAL HIGH (ref 70–99)
Potassium: 3.8 mmol/L (ref 3.5–5.1)
Sodium: 141 mmol/L (ref 135–145)
Total Bilirubin: 0.4 mg/dL (ref 0.3–1.2)
Total Protein: 7.2 g/dL (ref 6.5–8.1)

## 2022-09-25 LAB — CBC WITH DIFFERENTIAL (CANCER CENTER ONLY)
Abs Immature Granulocytes: 0.02 10*3/uL (ref 0.00–0.07)
Basophils Absolute: 0 10*3/uL (ref 0.0–0.1)
Basophils Relative: 1 %
Eosinophils Absolute: 0 10*3/uL (ref 0.0–0.5)
Eosinophils Relative: 1 %
HCT: 42 % (ref 39.0–52.0)
Hemoglobin: 13.8 g/dL (ref 13.0–17.0)
Immature Granulocytes: 1 %
Lymphocytes Relative: 31 %
Lymphs Abs: 1.2 10*3/uL (ref 0.7–4.0)
MCH: 29.4 pg (ref 26.0–34.0)
MCHC: 32.9 g/dL (ref 30.0–36.0)
MCV: 89.6 fL (ref 80.0–100.0)
Monocytes Absolute: 0.2 10*3/uL (ref 0.1–1.0)
Monocytes Relative: 6 %
Neutro Abs: 2.4 10*3/uL (ref 1.7–7.7)
Neutrophils Relative %: 60 %
Platelet Count: 201 10*3/uL (ref 150–400)
RBC: 4.69 MIL/uL (ref 4.22–5.81)
RDW: 12.8 % (ref 11.5–15.5)
WBC Count: 3.9 10*3/uL — ABNORMAL LOW (ref 4.0–10.5)
nRBC: 0 % (ref 0.0–0.2)

## 2022-09-26 DIAGNOSIS — F02A4 Dementia in other diseases classified elsewhere, mild, with anxiety: Secondary | ICD-10-CM | POA: Diagnosis not present

## 2022-10-01 ENCOUNTER — Encounter: Payer: Self-pay | Admitting: Hematology

## 2022-10-01 DIAGNOSIS — N2889 Other specified disorders of kidney and ureter: Secondary | ICD-10-CM | POA: Diagnosis not present

## 2022-10-01 DIAGNOSIS — Z85048 Personal history of other malignant neoplasm of rectum, rectosigmoid junction, and anus: Secondary | ICD-10-CM | POA: Diagnosis not present

## 2022-10-03 DIAGNOSIS — E785 Hyperlipidemia, unspecified: Secondary | ICD-10-CM | POA: Diagnosis not present

## 2022-10-31 ENCOUNTER — Other Ambulatory Visit: Payer: Self-pay | Admitting: Physician Assistant

## 2022-11-05 DIAGNOSIS — D49511 Neoplasm of unspecified behavior of right kidney: Secondary | ICD-10-CM | POA: Diagnosis not present

## 2022-11-05 DIAGNOSIS — R972 Elevated prostate specific antigen [PSA]: Secondary | ICD-10-CM | POA: Diagnosis not present

## 2022-11-19 DIAGNOSIS — Z23 Encounter for immunization: Secondary | ICD-10-CM | POA: Diagnosis not present

## 2022-11-20 DIAGNOSIS — F02A4 Dementia in other diseases classified elsewhere, mild, with anxiety: Secondary | ICD-10-CM | POA: Diagnosis not present

## 2022-11-23 ENCOUNTER — Encounter: Payer: Self-pay | Admitting: Hematology

## 2022-11-23 NOTE — Telephone Encounter (Signed)
TC

## 2022-11-26 ENCOUNTER — Other Ambulatory Visit: Payer: Self-pay | Admitting: Physician Assistant

## 2022-11-26 DIAGNOSIS — N2889 Other specified disorders of kidney and ureter: Secondary | ICD-10-CM | POA: Diagnosis not present

## 2022-11-26 DIAGNOSIS — N281 Cyst of kidney, acquired: Secondary | ICD-10-CM | POA: Diagnosis not present

## 2022-11-26 DIAGNOSIS — K8020 Calculus of gallbladder without cholecystitis without obstruction: Secondary | ICD-10-CM | POA: Diagnosis not present

## 2022-11-26 DIAGNOSIS — N200 Calculus of kidney: Secondary | ICD-10-CM | POA: Diagnosis not present

## 2022-12-27 ENCOUNTER — Encounter: Payer: Self-pay | Admitting: Hematology

## 2022-12-27 NOTE — Telephone Encounter (Signed)
Telephone call  

## 2023-02-05 DIAGNOSIS — R972 Elevated prostate specific antigen [PSA]: Secondary | ICD-10-CM | POA: Diagnosis not present

## 2023-02-05 DIAGNOSIS — D49511 Neoplasm of unspecified behavior of right kidney: Secondary | ICD-10-CM | POA: Diagnosis not present

## 2023-02-26 DIAGNOSIS — E785 Hyperlipidemia, unspecified: Secondary | ICD-10-CM | POA: Diagnosis not present

## 2023-02-26 DIAGNOSIS — Z0000 Encounter for general adult medical examination without abnormal findings: Secondary | ICD-10-CM | POA: Diagnosis not present

## 2023-02-26 DIAGNOSIS — D630 Anemia in neoplastic disease: Secondary | ICD-10-CM | POA: Diagnosis not present

## 2023-02-26 DIAGNOSIS — E119 Type 2 diabetes mellitus without complications: Secondary | ICD-10-CM | POA: Diagnosis not present

## 2023-02-28 DIAGNOSIS — C61 Malignant neoplasm of prostate: Secondary | ICD-10-CM | POA: Diagnosis not present

## 2023-02-28 DIAGNOSIS — G40909 Epilepsy, unspecified, not intractable, without status epilepticus: Secondary | ICD-10-CM | POA: Diagnosis not present

## 2023-02-28 DIAGNOSIS — R296 Repeated falls: Secondary | ICD-10-CM | POA: Diagnosis not present

## 2023-02-28 DIAGNOSIS — I700 Atherosclerosis of aorta: Secondary | ICD-10-CM | POA: Diagnosis not present

## 2023-02-28 DIAGNOSIS — Z85048 Personal history of other malignant neoplasm of rectum, rectosigmoid junction, and anus: Secondary | ICD-10-CM | POA: Diagnosis not present

## 2023-02-28 DIAGNOSIS — F419 Anxiety disorder, unspecified: Secondary | ICD-10-CM | POA: Diagnosis not present

## 2023-02-28 DIAGNOSIS — E785 Hyperlipidemia, unspecified: Secondary | ICD-10-CM | POA: Diagnosis not present

## 2023-02-28 DIAGNOSIS — N2889 Other specified disorders of kidney and ureter: Secondary | ICD-10-CM | POA: Diagnosis not present

## 2023-02-28 DIAGNOSIS — E119 Type 2 diabetes mellitus without complications: Secondary | ICD-10-CM | POA: Diagnosis not present

## 2023-02-28 DIAGNOSIS — Z0000 Encounter for general adult medical examination without abnormal findings: Secondary | ICD-10-CM | POA: Diagnosis not present

## 2023-03-20 ENCOUNTER — Encounter: Payer: Self-pay | Admitting: Physician Assistant

## 2023-03-20 ENCOUNTER — Ambulatory Visit (INDEPENDENT_AMBULATORY_CARE_PROVIDER_SITE_OTHER): Payer: Medicare Other | Admitting: Physician Assistant

## 2023-03-20 VITALS — BP 166/84 | HR 74 | Resp 18 | Ht 67.0 in | Wt 140.0 lb

## 2023-03-20 DIAGNOSIS — F03A0 Unspecified dementia, mild, without behavioral disturbance, psychotic disturbance, mood disturbance, and anxiety: Secondary | ICD-10-CM

## 2023-03-20 DIAGNOSIS — G40009 Localization-related (focal) (partial) idiopathic epilepsy and epileptic syndromes with seizures of localized onset, not intractable, without status epilepticus: Secondary | ICD-10-CM

## 2023-03-20 NOTE — Patient Instructions (Addendum)
It was a pleasure to see you today at our office.   Recommendations:  Meds: Follow up in August 14 at 11:30  Continue  donepezil  5 mg daily.   Continue memantine 5 mg at night  Continue Keppra as directed          RECOMMENDATIONS FOR ALL PATIENTS WITH MEMORY PROBLEMS: 1. Continue to exercise (Recommend 30 minutes of walking everyday, or 3 hours every week) 2. Increase social interactions - continue going to [place] and enjoy social gatherings with friends and family 3. Eat healthy, avoid fried foods and eat more fruits and vegetables 4. Maintain adequate blood pressure, blood sugar, and blood cholesterol level. Reducing the risk of stroke and cardiovascular disease also helps promoting better memory. 5. Avoid stressful situations. Live a simple life and avoid aggravations. Organize your time and prepare for the next day in anticipation. 6. Sleep well, avoid any interruptions of sleep and avoid any distractions in the bedroom that may interfere with adequate sleep quality 7. Avoid sugar, avoid sweets as there is a strong link between excessive sugar intake, diabetes, and cognitive impairment We discussed the Mediterranean diet, which has been shown to help patients reduce the risk of progressive memory disorders and reduces cardiovascular risk. This includes eating fish, eat fruits and green leafy vegetables, nuts like almonds and hazelnuts, walnuts, and also use olive oil. Avoid fast foods and fried foods as much as possible. Avoid sweets and sugar as sugar use has been linked to worsening of memory function.  There is always a concern of gradual progression of memory problems. If this is the case, then we may need to adjust level of care according to patient needs. Support, both to the patient and caregiver, should then be put into place.    The Alzheimer's Association is here all day, every day for people facing Alzheimer's disease through our free 24/7 Helpline: [phone]. The  Helpline provides reliable information and support to all those who need assistance, such as individuals living with memory loss, Alzheimer's or other dementia, caregivers, health care professionals and the public.  Our highly trained and knowledgeable staff can help you with: Understanding memory loss, dementia and Alzheimer's  Medications and other treatment options  General information about aging and brain health  Skills to provide quality care and to find the best care from professionals  Legal, financial and living-arrangement decisions Our Helpline also features: Confidential care consultation provided by master's level clinicians who can help with decision-making support, crisis assistance and education on issues families face every day  Help in a caller's preferred language using our translation service that features more than 200 languages and dialects  Referrals to local community programs, services and ongoing support     FALL PRECAUTIONS: Be cautious when walking. Scan the area for obstacles that may increase the risk of trips and falls. When getting up in the mornings, sit up at the edge of the bed for a few minutes before getting out of bed. Consider elevating the bed at the head end to avoid drop of blood pressure when getting up. Walk always in a well-lit room (use night lights in the walls). Avoid area rugs or power cords from appliances in the middle of the walkways. Use a walker or a cane if necessary and consider physical therapy for balance exercise. Get your eyesight checked regularly.  FINANCIAL OVERSIGHT: Supervision, especially oversight when making financial decisions or transactions is also recommended.  HOME SAFETY: Consider the safety of the kitchen  when operating appliances like stoves, microwave oven, and blender. Consider having supervision and share cooking responsibilities until no longer able to participate in those. Accidents with firearms and other hazards in  the house should be identified and addressed as well.   ABILITY TO BE LEFT ALONE: If patient is unable to contact 911 operator, consider using LifeLine, or when the need is there, arrange for someone to stay with patients. Smoking is a fire hazard, consider supervision or cessation. Risk of wandering should be assessed by caregiver and if detected at any point, supervision and safe proof recommendations should be instituted.  MEDICATION SUPERVISION: Inability to self-administer medication needs to be constantly addressed. Implement a mechanism to ensure safe administration of the medications.   DRIVING: Regarding driving, in patients with progressive memory problems, driving will be impaired. We advise to have someone else do the driving if trouble finding directions or if minor accidents are reported. Independent driving assessment is available to determine safety of driving.   If you are interested in the driving assessment, you can contact the following:  [facility] [employer] in [place] [phone]  [facility] [phone]  [facility] [phone] or [phone]      Mediterranean Diet A Mediterranean diet refers to food and lifestyle choices that are based on the traditions of countries located on the [employer]. This way of eating has been shown to help prevent certain conditions and improve outcomes for people who have chronic diseases, like kidney disease and heart disease. What are tips for following this plan? Lifestyle  Cook and eat meals together with your family, when possible. Drink enough fluid to keep your urine clear or pale yellow. Be physically active every day. This includes: Aerobic exercise like running or swimming. Leisure activities like gardening, walking, or housework. Get 7-8 hours of sleep each night. If recommended by your health care provider, drink red wine in moderation. This  means 1 glass a day for nonpregnant women and 2 glasses a day for men. A glass of wine equals 5 oz (150 mL). Reading food labels  Check the serving size of packaged foods. For foods such as rice and pasta, the serving size refers to the amount of cooked product, not dry. Check the total fat in packaged foods. Avoid foods that have saturated fat or trans fats. Check the ingredients list for added sugars, such as corn syrup. Shopping  At the grocery store, buy most of your food from the areas near the walls of the store. This includes: Fresh fruits and vegetables (produce). Grains, beans, nuts, and seeds. Some of these may be available in unpackaged forms or large amounts (in bulk). Fresh seafood. Poultry and eggs. Low-fat dairy products. Buy whole ingredients instead of prepackaged foods. Buy fresh fruits and vegetables in-season from local farmers markets. Buy frozen fruits and vegetables in resealable bags. If you do not have access to quality fresh seafood, buy precooked frozen shrimp or canned fish, such as tuna, salmon, or sardines. Buy small amounts of raw or cooked vegetables, salads, or olives from the deli or salad bar at your store. Stock your pantry so you always have certain foods on hand, such as olive oil, canned tuna, canned tomatoes, rice, pasta, and beans. Cooking  Cook foods with extra-virgin olive oil instead of using butter or other vegetable oils. Have meat as a side dish, and have vegetables or grains as your main dish. This means having meat in small portions or  adding small amounts of meat to foods like pasta or stew. Use beans or vegetables instead of meat in common dishes like chili or lasagna. Experiment with different cooking methods. Try roasting or broiling vegetables instead of steaming or sauteing them. Add frozen vegetables to soups, stews, pasta, or rice. Add nuts or seeds for added healthy fat at each meal. You can add these to yogurt, salads, or vegetable  dishes. Marinate fish or vegetables using olive oil, lemon juice, garlic, and fresh herbs. Meal planning  Plan to eat 1 vegetarian meal one day each week. Try to work up to 2 vegetarian meals, if possible. Eat seafood 2 or more times a week. Have healthy snacks readily available, such as: Vegetable sticks with hummus. Greek yogurt. Fruit and nut trail mix. Eat balanced meals throughout the week. This includes: Fruit: 2-3 servings a day Vegetables: 4-5 servings a day Low-fat dairy: 2 servings a day Fish, poultry, or lean meat: 1 serving a day Beans and legumes: 2 or more servings a week Nuts and seeds: 1-2 servings a day Whole grains: 6-8 servings a day Extra-virgin olive oil: 3-4 servings a day Limit red meat and sweets to only a few servings a month What are my food choices? Mediterranean diet Recommended Grains: Whole-grain pasta. Brown rice. Bulgar wheat. Polenta. Couscous. Whole-wheat bread. Orpah Cobb. Vegetables: Artichokes. Beets. Broccoli. Cabbage. Carrots. Eggplant. Green beans. Chard. Kale. Spinach. Onions. Leeks. Peas. Squash. Tomatoes. Peppers. Radishes. Fruits: Apples. Apricots. Avocado. Berries. Bananas. Cherries. Dates. Figs. Grapes. Lemons. Melon. Oranges. Peaches. Plums. Pomegranate. Meats and other protein foods: Beans. Almonds. Sunflower seeds. Pine nuts. Peanuts. Cod. Salmon. Scallops. Shrimp. Tuna. Tilapia. Clams. Oysters. Eggs. Dairy: Low-fat milk. Cheese. Greek yogurt. Beverages: Water. Red wine. Herbal tea. Fats and oils: Extra virgin olive oil. Avocado oil. Grape seed oil. Sweets and desserts: [place] yogurt with honey. Baked apples. Poached pears. Trail mix. Seasoning and other foods: Basil. Cilantro. Coriander. Cumin. Mint. Parsley. Sage. Rosemary. Tarragon. Garlic. Oregano. Thyme. Pepper. Balsalmic vinegar. Tahini. Hummus. Tomato sauce. Olives. Mushrooms. Limit these Grains: Prepackaged pasta or rice dishes. Prepackaged cereal with added  sugar. Vegetables: Deep fried potatoes (french fries). Fruits: Fruit canned in syrup. Meats and other protein foods: Beef. Pork. Lamb. Poultry with skin. Hot dogs. Tomasa Blase. Dairy: Ice cream. Sour cream. Whole milk. Beverages: Juice. Sugar-sweetened soft drinks. Beer. Liquor and spirits. Fats and oils: Butter. Canola oil. Vegetable oil. Beef fat (tallow). Lard. Sweets and desserts: Cookies. Cakes. Pies. Candy. Seasoning and other foods: Mayonnaise. Premade sauces and marinades. The items listed may not be a complete list. Talk with your dietitian about what dietary choices are right for you. Summary The Mediterranean diet includes both food and lifestyle choices. Eat a variety of fresh fruits and vegetables, beans, nuts, seeds, and whole grains. Limit the amount of red meat and sweets that you eat. Talk with your health care provider about whether it is safe for you to drink red wine in moderation. This means 1 glass a day for nonpregnant women and 2 glasses a day for men. A glass of wine equals 5 oz (150 mL). This information is not intended to replace advice given to you by your health care provider. Make sure you discuss any questions you have with your health care provider. Document Released: 09/15/2015 Document Revised: 10/18/2015 Document Reviewed: 09/15/2015 Elsevier Interactive Patient Education  2017 [employer].

## 2023-03-20 NOTE — Progress Notes (Signed)
Assessment/Plan:   Mild dementia of unclear etiology  Carlos [name] is a delightful 78 y.o. RH male with a history of GAD and mild dementia, likely early stages, possibly due to Alzheimer's disease without behavioral disturbance per neuropsych evaluation on July 2024 seen today in follow up for memory loss. Patient is currently on memantine 5 mg twice daily and donepezil 5 mg daily.  Memory is stable, today's MMSE is again 26/30.  He still able to participate in his ADLs without difficulty.  He continues to drive without becoming lost although the caregiver is concerned that sometimes he becomes aggressive with driving.  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  Mood is good.    Follow up in 6 months. Continue memantine 5 mg twice daily and donepezil 5 mg daily, side effects discussed Agree with psychology Dr. Renard Hamper for GAD.  Continue Keppra as directed due to history of seizures Recommend no further driving Recommend good control of her cardiovascular risk factors Continue to control mood as per PCP     Subjective:    This patient is accompanied in the office by his caregiver and his sister who supplements the history.  Previous records as well as any outside records available were reviewed prior to todays visit. Patient was last seen on 09/17/2022.  Last MMSE 03/23/2022 was 26/30.   Any changes in memory since last visit? "  STM may be a little bit worse "-caregiver says.  He has some difficulty remembering recent conversations and new information as before, that he may be asking the same thing within 20 minutes as before.  He may miss parts of the conversation.  He enjoys reading the NYT, WSJ.  He continues to work. repeats oneself?  Endorsed Disoriented when walking into a room?  Patient denies   Leaving objects?  May misplace things but not in unusual places  Wandering behavior?  denies     Any worsening depression?:  Denies.   Hallucinations or paranoia?  Denies.   Seizures?  denies.  He is on Keppra.  Denies aura, taste changes, dizziness, body jerks.  Any sleep changes?  Sleeps well with mirtazapine, denies vivid dreams, REM behavior or sleepwalking.   Sleep apnea?   Denies.   Any hygiene concerns? Denies.  Independent of bathing and dressing?  Endorsed  Does the patient needs help with medications?  Sister is in charge  Who is in charge of the finances?  Sister is in charge    Any changes in appetite?  Denies, eating well.    Patient have trouble swallowing? Denies.   Does the patient cook? No Any headaches?   denies   Chronic back pain  denies   Ambulates with difficulty?  He walks around the house, afraid of walking alone in the street "is not a very good neighborhood at [employer] says.  Recent falls or head injuries? Denies.     Unilateral weakness, numbness or tingling? Denies.   Any tremors?  Denies.   Any anosmia?  Denies   Any incontinence of urine? Denies  Any bowel dysfunction?  Occasional diarrhea, follows GI oncology.     Patient lives alone, but has caregiver  6 times a week from Monday through Saturday 10:30-2:30 pm  Does the patient drive?  His caregiver reports that sometimes his driving is aggressive, denies getting lost.   Neuropsych evaluation 08/2022 Briefly, results suggested severe impairment surrounding both delayed retrieval and recognition/consolidation aspects of memory. Additional weaknesses were exhibited across processing  speed, complex attention, and fine motor coordination bilaterally. Performance variability was exhibited across executive functioning and confrontation naming. Relative to his previous evaluation in December 2020, a primary decline was exhibited across delayed retrieval/recognition aspects of visual memory despite different instrumentation. Mild decline was also exhibited across processing speed and response inhibition. Very subtle decline across semantic fluency remains a possibility; however, scores remained  normatively appropriate overall. The etiology for his likely mild dementia presentation remains difficult to pinpoint. Across memory testing, despite being able to learn information well, he was nearly amnestic (i.e., 0% to 22% retention rates) across all memory tasks. He also exhibited very poor performance across yes/no recognition trials. Taken together, this suggests rapid forgetting and a prominent storage impairment, both of which are hallmark signs of Alzheimer's disease. Variability across confrontation naming would follow typical disease progression. Semantic fluency and visuospatial abilities remained normatively appropriate, which is encouraging. Also encouraging is the fact that Carlos [name]' profile exhibited a good deal of stability given that 3.5 years had passed relative to his previous evaluation. If Alzheimer's disease is present and driving cognitive and functional decline, it would appear to still be in earlier stages and progressing quite slowly at the present time.     History on Initial Assessment 11/07/2018: This is a pleasant 78 year old right-handed man with no significant past medical history presenting for evaluation of memory loss and frequent falls. His sister is present to provide additional information. He thinks his memory is reasonable. He states family has noticed more changes, they ask him things and "I don't give good answers." He endorses short-term memory changes. He lives with his 41 year old mother who has 4 caregivers. 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 He denies getting lost driving. He denies any missed bill payments. He is not on any medications. He denies any difficulties at work, he states it is routine work. His sister started noticing changes a year ago, he would forget conversations. He would forget he had already finished a task, for instance that he had already came from the grocery store. She feels symptoms worsened in the past 5-6 months. Last month, he  was visiting another sister and forgot the last portion of the trip, he did not get to his destination and just went home. He has told family he has gotten lost coming home from work, he arrived home an hour later (he has been in the same place for 7 years). His family is also concerned about an increase in frequency of falls. He has had 3 falls in the past 6 months. He reports injuring his foot and hitting his head with a fall in April, then hurting his back in July. He thinks it is due to clumsiness. No loss of consciousness. They are concerned about the possibility of a brain tumor, their father had a brain tumor. There is a history of memory loss in his mother and younger brother (secondary to stroke). He denies any history of significant head injuries or alcohol use.    He denies any headaches, dizziness, diplopia, dysarthria, dysphagia, neck/back pain, focal numbness/tingling/weakness, bowel/bladder dysfunction, anosmia, or tremors. He felt unwell last night and BP was 151/86, he went home early from work. He states he is on his feet 12 hours a day, and he is now required to work 6 days a week (mandatory due to Covid-19 changes), he was previously working 5 days a week. His sister is concerned he is not eating well, mostly sugars  and hotdogs. He is overly tired and sleepy some days. Mood is good, he has "always been hyper, but now it more" per sister. No hallucinations or paranoia.    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      PREVIOUS MEDICATIONS:   CURRENT MEDICATIONS:  Outpatient Encounter Medications as of 03/20/2023  Medication Sig   atorvastatin (LIPITOR) 10 MG tablet 1 tablet Orally Once a day for 90 days   dicyclomine (BENTYL) 20 MG tablet Take 20 mg by mouth 3 (three) times daily as needed.    donepezil (ARICEPT) 5 MG tablet Take one tablet 5 mg daily   levETIRAcetam (KEPPRA) 1000 MG tablet Take 1 tablet twice a day (take with Levetiracetam 250mg  tablet for total of 1250mg  twice a day)   levETIRAcetam (KEPPRA) 250 MG tablet TAKE 5 TABLETS BY MOUTH TWICE DAILY   loperamide (IMODIUM) 2 MG capsule Take 1 capsule (2 mg total) by mouth every 6 (six) hours as needed for diarrhea or loose stools.   memantine (NAMENDA) 5 MG tablet TAKE 1 TABLET(5 MG) BY MOUTH AT BEDTIME   methylcellulose (CITRUCEL) oral powder Take by mouth.   mirtazapine (REMERON) 15 MG tablet Take one tablet (15 mg) BY MOUTH AT BEDTIME FOR INSOMNIA   Multiple Vitamin (MULTIVITAMIN WITH MINERALS) TABS tablet Take 1 tablet by mouth daily.   NON FORMULARY Take 1 capsule by mouth daily. Mind works   famotidine (PEPCID) 20 MG tablet Take 1 tablet (20 mg total) by mouth 2 (two) times daily as needed.   No facility-administered encounter medications on file as of 03/20/2023.       03/23/2022   10:00 AM 09/20/2021    2:00 PM 06/06/2020   10:00 AM  MMSE - Mini Mental State Exam  Orientation to time 4 5 5   Orientation to Place 5 5 5   Registration 3 3 3   Attention/ Calculation 5 5 3   Recall 0 0 2  Language- name 2 objects 2 2 2   Language- repeat 1 1 1   Language- follow 3 step command 3 3 3   Language- read & follow direction 1 1 1   Write a sentence 1 1 1   Copy design 1 1 0  Total score 26 27 26       10/25/2020    3:00 PM 11/07/2018    9:00 AM  Montreal Cognitive Assessment   Visuospatial/ Executive (0/5) 2 3  Naming (0/3) 3 3  Attention: Read list of digits (0/2) 1 2  Attention: Read list of letters (0/1) 1 1  Attention: Serial 7 subtraction starting at 100 (0/3) 3 3  Language: Repeat phrase (0/2) 2 2  Language : Fluency (0/1) 1 0  Abstraction (0/2) 2 2  Delayed Recall (0/5) 0 0  Orientation (0/6) 6 6  Total 21 22  Adjusted Score (based on education) 21 22    Objective:     PHYSICAL EXAMINATION:    VITALS:    Vitals:   03/20/23 1129 03/20/23 1210  BP: (!) 176/73 (!) 166/84  Pulse: 74   Resp: 18   SpO2: 98%   Weight: 140 lb (63.5 kg)   Height: 5\' 7"  (1.702 m)     GEN:  The patient appears stated age and is in NAD. HEENT:  Normocephalic, atraumatic.   Neurological examination:  General: NAD, well-groomed, appears stated age. Orientation:  The patient is alert. Oriented to person, place and date.  Anxious appearing. Cranial nerves: There is good facial symmetry.The speech is fluent and clear. No aphasia or dysarthria. Fund of knowledge is appropriate. Recent and remote memory are impaired. Attention and concentration are reduced.  Able to name objects and repeat phrases.  Hearing is intact to conversational tone.  Sensation: Sensation is intact to light touch throughout Motor: Strength is at least antigravity x4. DTR's 2/4 in UE/LE     Movement examination: Tone: There is normal tone in the UE/LE Abnormal movements:  no tremor.  No myoclonus.  No asterixis.   Coordination:  There is no decremation with RAM's. Normal finger to nose  Gait and Station: The patient has no difficulty arising out of a deep-seated chair without the use of the hands. The patient's stride length is good.  Gait is cautious and narrow.    Thank you for allowing [place] the opportunity to participate in the care of this nice patient. Please do not hesitate to contact us for any questions or concerns.   Total time spent on today's visit was 36 minutes dedicated to this patient today, preparing to see patient, examining the patient, ordering tests and/or medications and counseling the patient, documenting clinical information in the EHR or other health record, independently interpreting results and communicating results to the patient/family, discussing treatment and goals, answering patient's questions and coordinating care.  Cc:  Farris Has, MD  [name] 03/20/2023 12:48 PM

## 2023-03-22 ENCOUNTER — Other Ambulatory Visit: Payer: Self-pay

## 2023-03-22 DIAGNOSIS — C20 Malignant neoplasm of rectum: Secondary | ICD-10-CM

## 2023-03-24 NOTE — Assessment & Plan Note (Signed)
[id number] stage III -Diagnosed in 05/2019 with locally advanced stage III rectal cancer  -Completed neoadjuvant chemo with 8 cycles of FOLFOX from 5/2 7/21-10/08/2019.  He started concurrent chemo RT with Xeloda on 11/02/2019 - 12/07/19 -S/p LAR at [facility] 02/08/20, path showed ypT1 pN0, clear margins, no high risk features such as perforation, LVI, or PNI -Surveillance colonoscopy 09/2020 was benign, 3-year recall -Surveillance CTs showed NED, last done 03/2022. Repeat as clinically indicated at this point -Carlos [name] is clinically doing well, no concerns, exam is benign and labs are unremarkable. Overall no clinical concern for recurrence -He is 3 years from definitive surgery, recurrence risk, reviewed with the  patient, has decreased. Continue surveillance -reviewed s/sx to notify, including unintentional weight loss, change in bowel habits, black or bloody stools, abdominal pain/bloating, or significant fatigue -Follow-up in 6 months, or sooner if needed

## 2023-03-24 NOTE — Progress Notes (Unsigned)
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  He started concurrent chemo RT with Xeloda on 11/02/2019 - 12/07/19 -S/p LAR at [facility] 02/08/20, path showed ypT1 pN0, clear margins, no high risk features such as perforation, LVI, or PNI -Surveillance colonoscopy 09/2020 was benign, 3-year recall -Surveillance CTs showed NED, last done 03/2022. Repeat as clinically indicated at this point -Carlos [name] is clinically doing well, no concerns, exam is benign and labs are unremarkable. Overall no clinical concern for recurrence -He is 3 years from definitive surgery, recurrence risk, reviewed with the  patient, has decreased. Continue surveillance -reviewed s/sx to notify, including unintentional weight loss, change in bowel habits, black or bloody stools, abdominal pain/bloating, or significant fatigue -Follow-up in 6 months, or sooner if needed   Plan: Labs reviewed, all within normal limits. Exam is benign with no concern for recurrence or metastatic disease today. Recommend Colace as needed for constipation or hard stools.  Also recommend Gas-X (simethicone) to help relieve gas and bloating.  He can also take Imodium OTC to treat mild to moderate diarrhea.  He needs to increase water intake. He is 3 years from definitive surgery.  Continue surveillance. Labs with follow-up in 6 months.  He knows to contact the clinic sooner if any new  symptoms or concerns develop. The patient understands the plans discussed today and is in agreement with them.  He knows to contact our office if he develops concerns prior to his next appointment.  I provided 25 minutes of face-to-face time during this encounter and > 50% was spent counseling as documented under my assessment and plan.    Carlean Jews, NP  [facility] - A DEPT OF [facility] [address] [place] [address] Dept: [phone] Dept Fax: [phone]   No orders of the defined types were placed in this encounter.     CHIEF COMPLAINT:  CC: Follow-up rectal cancer  Current Treatment: Surveillance  INTERVAL HISTORY:  Carlos is here today for repeat clinical assessment.  He was last seen by Clayborn Heron, NP on 09/25/2022.  States that he is doing well in general.  Continues to work full-time for the [employer].  Does have trouble with gas and bloating at times.  Will take OTC Gas-X which helps.  Also has problems with constipation or "tightness" when he is having a bowel movement.  This is intermittent.  This is not painful.  Sometimes, he also has problems with diarrhea.  Will fluctuate between the symptoms of constipation and symptoms of diarrhea.  He knows he needs to drink more water.  He also understands he needs to figure out which foods cause irritation to his colon and avoid them for be proactive with stool softeners or antidiarrheals. He denies chest pain, chest pressure, or shortness of breath. He denies headaches or visual disturbances. He denies abdominal pain, nausea, vomiting, or changes in bowel or bladder habits.  He denies fevers  or chills. He denies pain. His appetite is good. His weight has been stable.  I have reviewed the past medical history, past surgical history, social history and family history with the patient and they are unchanged from previous note.  ALLERGIES:  has no known  allergies.  MEDICATIONS:  Current Outpatient Medications  Medication Sig Dispense Refill   atorvastatin (LIPITOR) 10 MG tablet 1 tablet Orally Once a day for 90 days     Cholecalciferol (VITAMIN D-3) 25 MCG (1000 UT) CAPS Take 2 each by mouth daily.     dicyclomine (BENTYL) 20 MG tablet Take 20 mg by mouth 3 (three) times daily as needed.     donepezil (ARICEPT) 5 MG tablet Take one tablet 5 mg daily 90 tablet 3   famotidine (PEPCID) 20 MG tablet Take 1 tablet (20 mg total) by mouth 2 (two) times daily as needed. 90 tablet 0   levETIRAcetam (KEPPRA) 1000 MG tablet Take 1 tablet twice a day (take with Levetiracetam 250mg  tablet for total of 1250mg  twice a day) 180 tablet 1   levETIRAcetam (KEPPRA) 250 MG tablet TAKE 5 TABLETS BY MOUTH TWICE DAILY 900 tablet 1   memantine (NAMENDA) 5 MG tablet TAKE 1 TABLET(5 MG) BY MOUTH AT BEDTIME 30 tablet 3   mirtazapine (REMERON) 15 MG tablet Take one tablet (15 mg) BY MOUTH AT BEDTIME FOR INSOMNIA 30 tablet 2   Multiple Vitamin (MULTIVITAMIN WITH MINERALS) TABS tablet Take 1 tablet by mouth daily.     OVER THE COUNTER MEDICATION Take 2 tablets by mouth daily. Neuro Q memory pill     Probiotic Product (SUPER PROBIOTIC) CAPS Take 1 capsule by mouth daily.     simethicone (MYLICON) 125 MG chewable tablet Chew 125 mg by mouth every 6 (six) hours as needed for flatulence.     No current facility-administered medications for this visit.    HISTORY OF PRESENT ILLNESS:   Oncology History Overview Note  Cancer Staging Rectal adenocarcinoma [facility]) Staging form: Colon and Rectum, AJCC 8th Edition - Clinical stage from 06/12/2019: cT4, cN1 - Unsigned Stage prefix: Initial diagnosis - Pathologic stage from 02/08/2020: Stage I (ypT1, pN0, cM0) - Signed by Malachy Mood, MD on 04/06/2020 Stage prefix: Post-therapy Total positive nodes: 0 Residual tumor (R): R0 - None    Adenocarcinoma of rectum  05/29/2019 Procedure   Colonoscopy per Dr. Marca Ancona A digital rectal exam  was normal  frond-like fungating infiltrative non-obstructing large mass in the rectum. The mass was partially circumferential, measured 5 cm in length. The colonoscopy also showed 11 sessile polyps in the transverse and asecnding colon and hepatic flexure.    05/29/2019 Initial Biopsy   Pathology on the rectal biopsy showed at least intramucosal adenocarcinoma involving tubulovillous adenoma with high grade dysplasia.   06/05/2019 Imaging   CT CAP IMPRESSION: 1. Cholelithiasis and gallbladder wall thickening with equivocal pericholecystic inflammation. Acute cholecystitis not excluded. If there is clinical suspicion for acute cholecystitis, recommend ultrasound evaluation. 2. Approximately 5 cm irregular mass within the distal sigmoid colon with possible extension through the wall. No definite abnormal adjacent lymph nodes. 3. Mild omental haziness-nonspecific. Metastatic disease not excluded. 4. Indeterminate 3-4 mm RIGHT pulmonary nodules which could represent metastatic disease. These are too small for PET CT evaluation. Consider CT follow-up. 5. 2.5 cm irregular cystic mass within the RIGHT kidney. Elective MRI recommended for further evaluation. 6. 4 mm nonobstructing LEFT LOWER pole renal calculus. 7. Coronary artery disease. 8. Aortic Atherosclerosis (ICD10-I70.0).   06/12/2019 Imaging  Staging MRI pelvis  FINDINGS: TUMOR LOCATION Tumor distance from Anal Verge/Skin Surface:  11.4 cm Tumor distance to Internal Anal Sphincter: 6.5 cm TUMOR DESCRIPTION Circumferential Extent: 100% Tumor Length: 8.3 cm T - CATEGORY Extension through Muscularis Propria: Yes>45mm=T3d Shortest Distance of any tumor/node from Mesorectal Fascia: 0 mm, along the left lateral and posterior walls (tumor abuts the left lateral pelvic sidewall and sacrum) Extramural Vascular Invasion/Tumor Thrombus: No Invasion of Anterior Peritoneal Reflection: No Involvement of Adjacent Organs or Pelvic Sidewall:  Yes, involving the left lateral pelvic sidewall =T4 Levator Ani Involvement: No N - CATEGORY Mesorectal Lymph Nodes >=69mm: 2=N1 Extra-mesorectal Lymphadenopathy: No Other:  None. IMPRESSION: Rectal adenocarcinoma T stage: T4 Rectal adenocarcinoma N stage:  N1 Distance from tumor to the internal anal sphincter is 6.5 cm.   06/18/2019 Initial Diagnosis   Rectal adenocarcinoma (HCC)   07/02/2019 - 10/08/2019 Chemotherapy   Total Neoadjuvant FOLFOX q2weeks starting 07/02/19-10/08/19,  8 cycles   07/13/2019 PET scan   IMPRESSION: 1. Known rectal mass is intensely hypermetabolic compatible with primary rectal adenocarcinoma. No findings of FDG avid nodal metastasis or distant metastatic disease. 2. Hyperdense kidney cysts are identified bilaterally favored to represent hemorrhagic cyst. 3. Nonobstructing left renal calculi. 4. Aortic atherosclerosis, in addition to lad coronary artery disease. Please note that although the presence of coronary artery calcium documents the presence of coronary artery disease, the severity of this disease and any potential stenosis cannot be assessed on this non-gated CT examination. Assessment for potential risk factor modification, dietary therapy or pharmacologic therapy may be warranted, if clinically indicated. Aortic Atherosclerosis (ICD10-I70.0). 5. Gallstones.   09/11/2019 - 09/14/2019 Hospital Admission   Acute AMS, seizure 09/11/19 -Developed acute altered mental status day after Cycle 6 chemo (09/11/19).  -He was admitted for work up, EEG showed epileptic spike. ID work up negative, MRI negative for metastasis.    09/11/2019 Imaging   MRI Brain  IMPRESSION: Incomplete study. Images obtained reveal no acute abnormality. Negative for acute infarct.   Atrophy and mild ventricular enlargement stable from prior studies.   09/11/2019 Imaging   CT head  IMPRESSION: Mild ventricular prominence with normal appearing sulci. Question early communicating  hydrocephalus. Brain parenchyma appears unremarkable. No acute infarct. No mass or hemorrhage.   Foci of arterial vascular calcification noted. There is mucosal thickening in several ethmoid air cells.   There is probable cerumen in the right external auditory canal.     09/23/2019 Imaging   CT AP  IMPRESSION: 1. Solid-appearing left eccentric upper rectal mass measures about 6.7 by 2.9 cm. This is difficult to compare directly to the prior exam given differences in orientation and degree of distension of the rectum. There is some prominence of stool in the sigmoid colon and rectum on today's examination, but the mass is not obstructive. 2. Other imaging findings of potential clinical significance: Cholelithiasis. Complex bilateral renal lesions are similar to prior exams. Nonobstructive left nephrolithiasis. Mild prostatomegaly with nodular prominence the upper margin of the prostate gland. Lumbar spondylosis and degenerative disc disease causing multilevel foraminal impingement. 3. Aortic atherosclerosis.   Aortic Atherosclerosis (ICD10-I70.0).   11/02/2019 - 12/09/2019 Chemotherapy   Concurrent chemo RT with Oral Xeloda 1500mg  BID on days of RT starting 11/02/19-12/09/19   11/02/2019 - 12/09/2019 Radiation Therapy   Concurrent chemo RT by Dr Mitzi Hansen with Xeloda  starting 11/02/19-12/09/19   01/13/2020 Imaging   MRI at [facility] 1.  Reduced size of the previously demonstrated high rectal mass. The residual tumor measures 2.4  cm in maximal diameter and is primarily endoluminal. There is a similar amount of extramural disease spread into the left mesorectal space and presacral space.  2.  Improved appearance of lymphadenopathy with several conspicuous mesorectal and internal iliac lymph nodes.  3.  Small volume of nonspecific free fluid in the pelvis.   01/29/2020 Imaging   CT CAP  IMPRESSION: 1. Interval response to therapy of rectosigmoid junction primary. 2. No findings of metastatic  disease within the chest, abdomen, or pelvis. 3. Small pulmonary nodules, similar and decreased as detailed above. Nonspecific. 4. Right-sided Port-A-Cath tip at high IVC. consider repositioning. 5. Cholelithiasis. 6. Left nephrolithiasis. 7. Bilateral renal lesions. A medial upper pole renal lesion is suspicious for renal cell carcinoma. This could be re-evaluated at follow-up or more entirely characterized with routine outpatient pre and post contrast abdominal MRI.   02/08/2020 Surgery   LOWER ANTERIOR RESECTION RECTUM LAPAROSCOPIC by Dr Byrd Hesselbach at [facility]   Procedures   PR LAP,SURG,COLECTOMY, PARTIAL, W/ANAST   PR LAP, SURG MOBIL SPLENIC FL DUR PTL COLECTOMY   PR LAP, SURG ILEO/JEJUNO-STOMY   SIGMOID COLECTOMY LAPAROSCOPIC ASSISTED     02/08/2020 Pathology Results   Final Pathologic Diagnosis at [facility]     A.  LYMPH NODE, "IMA", EXCISION:              Two lymph nodes are negative for metastatic carcinoma (0/2).   B.  SIGMOID COLON AND RECTUM, LOWER ANTERIOR RESECTION: Residual well differentiated invasive adenocarcinoma of the rectum, arising from a tubulovillous adenoma with high grade dyplasia.               Size: 2 mm in greatest dimension.  Tumor invades the submucosa.  Negative for perineural and lymphovascular invasion. Resection margins are negative for malignancy or dysplasia.  Twenty-two lymph nodes are negative for metastatic carcinoma (0/22). Complete mesorectum.               See CAP cancer protocol summary and comment.   C.  PROXIMAL DOUGHNUT, RESECTION:                      Segment of benign colonic tissue.              Negative for malignancy or dyplasia.    D.  DISTAL DOUGHNUT, RESECTION:                            Segment of benign colonic tissue.              Negative for malignancy or dyplasia.    PATHOLOGIC STAGE CLASSIFICATION (pTNM, AJCC 8th Edition)         TNM Descriptors:    y (post-treatment)     Primary Tumor (pT):    pT1      Regional Lymph Nodes (pN):    pN0      02/08/2020 Cancer Staging   Staging form: Colon and Rectum, AJCC 8th Edition - Pathologic stage from 02/08/2020: Stage I (ypT1, pN0, cM0) - Signed by Malachy Mood, MD on 04/06/2020 Stage prefix: Post-therapy Total positive nodes: 0 Residual tumor (R): R0 - None   07/05/2020 Imaging   CT C/A/P w/o contrast  IMPRESSION: 1. Mildly indistinct appearance of the fat and gallbladder wall separation in the upper abdomen, of uncertain significance and in the setting of abundant gallstones in the gallbladder lumen. Correlate with any clinical signs of abdominal pain and consider  further evaluation with ultrasound as warranted. 2. RIGHT-sided Port-A-Cath tip at the high IVC near the diaphragmatic hiatus. Could consider repositioning as warranted. 3. 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 Consider follow-up evaluation, renal protocol study with with MRI with and without contrast as outlined in previous imaging. Hemorrhagic and/or proteinaceous cysts elsewhere in the kidneys. 4. LEFT nephrolithiasis as before with lower pole calculus approximately 6 mm. 5. No signs of metastatic disease with abundant stool in the colon following partial colonic resection. Correlate with any signs of constipation. 6. 4 mm nodule in the superior segment of the RIGHT lower lobe is unchanged. Attention on follow-up 7. Small midline periumbilical hernia containing fat. 8. Aortic atherosclerosis.   03/26/2022 Imaging    IMPRESSION: No evidence of recurrent or metastatic colon carcinoma.   Stable 2.3 cm solid right renal mass. Renal cell carcinoma cannot be excluded.   Cholelithiasis. No radiographic evidence of cholecystitis.   Colonic diverticulosis. No radiographic evidence of diverticulitis.   Stable mildly enlarged prostate.       REVIEW OF SYSTEMS:   Constitutional: Denies fevers, chills or abnormal weight loss Eyes: Denies  blurriness of vision Ears, nose, mouth, throat, and face: Denies mucositis or sore throat Respiratory: Denies cough, dyspnea or wheezes Cardiovascular: Denies palpitation, chest discomfort or lower extremity swelling Gastrointestinal:  Denies nausea, heartburn or change in bowel habits.  Does have some trouble with gas and bloating.  Will also fluctuate between constipation and diarrhea. Skin: Denies abnormal skin rashes Lymphatics: Denies new lymphadenopathy or easy bruising Neurological:Denies numbness, tingling or new weaknesses Behavioral/Psych: Mood is stable, no new changes  All other systems were reviewed with the patient and are negative.   VITALS:   Today's Vitals   03/25/23 1010 03/25/23 1015  BP: (!) 149/70   Pulse: 73   Resp: 16   Temp: 98.7 F (37.1 C)   TempSrc: Temporal   SpO2: 100%   Weight: 139 lb 3.2 oz (63.1 kg)   PainSc:  0-No pain   Body mass index is 21.8 kg/m.   Wt Readings from Last 3 Encounters:  03/25/23 139 lb 3.2 oz (63.1 kg)  03/20/23 140 lb (63.5 kg)  09/25/22 140 lb 8 oz (63.7 kg)    Body mass index is 21.8 kg/m.  Performance status (ECOG): 1 - Symptomatic but completely ambulatory  PHYSICAL EXAM:   GENERAL:alert, no distress and comfortable SKIN: skin color, texture, turgor are normal, no rashes or significant lesions EYES: normal, Conjunctiva are pink and non-injected, sclera clear OROPHARYNX:no exudate, no erythema and lips, buccal mucosa, and tongue normal  NECK: supple, thyroid normal size, non-tender, without nodularity LYMPH:  no palpable lymphadenopathy in the cervical, axillary or inguinal LUNGS: clear to auscultation and percussion with normal breathing effort HEART: regular rate & rhythm and no murmurs and no lower extremity edema ABDOMEN:abdomen soft, non-tender and normal bowel sounds Musculoskeletal:no cyanosis of digits and no clubbing  NEURO: alert & oriented x 3 with fluent speech, no focal motor/sensory  deficits  LABORATORY DATA:  I have reviewed the data as listed    Component Value Date/Time   NA 141 03/25/2023 0939   K 3.7 03/25/2023 0939   CL 106 03/25/2023 0939   CO2 30 03/25/2023 0939   GLUCOSE 114 (H) 03/25/2023 0939   BUN 12 03/25/2023 0939   CREATININE 0.99 03/25/2023 0939   CALCIUM 9.2 03/25/2023 0939   PROT 6.5 03/25/2023 0939   ALBUMIN 4.3 03/25/2023 0939   AST 23 03/25/2023  0939   ALT 17 03/25/2023 0939   ALKPHOS 64 03/25/2023 0939   BILITOT 0.5 03/25/2023 0939   GFRNONAA >60 03/25/2023 0939   GFRAA >60 11/09/2019 0947   Lab Results  Component Value Date   WBC 5.0 03/25/2023   NEUTROABS 2.9 03/25/2023   HGB 13.7 03/25/2023   HCT 41.1 03/25/2023   MCV 88.8 03/25/2023   PLT 177 03/25/2023

## 2023-03-25 ENCOUNTER — Encounter: Payer: Self-pay | Admitting: Nurse Practitioner

## 2023-03-25 ENCOUNTER — Inpatient Hospital Stay (HOSPITAL_BASED_OUTPATIENT_CLINIC_OR_DEPARTMENT_OTHER): Payer: Medicare Other | Admitting: Nurse Practitioner

## 2023-03-25 ENCOUNTER — Inpatient Hospital Stay: Payer: Medicare Other | Attending: Nurse Practitioner

## 2023-03-25 VITALS — BP 149/70 | HR 73 | Temp 98.7°F | Resp 16 | Wt 139.2 lb

## 2023-03-25 DIAGNOSIS — C20 Malignant neoplasm of rectum: Secondary | ICD-10-CM

## 2023-03-25 LAB — CMP (CANCER CENTER ONLY)
ALT: 17 U/L (ref 0–44)
AST: 23 U/L (ref 15–41)
Albumin: 4.3 g/dL (ref 3.5–5.0)
Alkaline Phosphatase: 64 U/L (ref 38–126)
Anion gap: 5 (ref 5–15)
BUN: 12 mg/dL (ref 8–23)
CO2: 30 mmol/L (ref 22–32)
Calcium: 9.2 mg/dL (ref 8.9–10.3)
Chloride: 106 mmol/L (ref 98–111)
Creatinine: 0.99 mg/dL (ref 0.61–1.24)
GFR, Estimated: >60 mL/min (ref >60)
Glucose, Bld: 114 mg/dL — ABNORMAL HIGH (ref 70–99)
Potassium: 3.7 mmol/L (ref 3.5–5.1)
Sodium: 141 mmol/L (ref 135–145)
Total Bilirubin: 0.5 mg/dL (ref 0.0–1.2)
Total Protein: 6.5 g/dL (ref 6.5–8.1)

## 2023-03-25 LAB — CBC WITH DIFFERENTIAL (CANCER CENTER ONLY)
Abs Immature Granulocytes: 0 10*3/uL (ref 0.00–0.07)
Basophils Absolute: 0 10*3/uL (ref 0.0–0.1)
Basophils Relative: 1 %
Eosinophils Absolute: 0 10*3/uL (ref 0.0–0.5)
Eosinophils Relative: 1 %
HCT: 41.1 % (ref 39.0–52.0)
Hemoglobin: 13.7 g/dL (ref 13.0–17.0)
Immature Granulocytes: 0 %
Lymphocytes Relative: 31 %
Lymphs Abs: 1.6 10*3/uL (ref 0.7–4.0)
MCH: 29.6 pg (ref 26.0–34.0)
MCHC: 33.3 g/dL (ref 30.0–36.0)
MCV: 88.8 fL (ref 80.0–100.0)
Monocytes Absolute: 0.5 10*3/uL (ref 0.1–1.0)
Monocytes Relative: 9 %
Neutro Abs: 2.9 10*3/uL (ref 1.7–7.7)
Neutrophils Relative %: 58 %
Platelet Count: 177 10*3/uL (ref 150–400)
RBC: 4.63 MIL/uL (ref 4.22–5.81)
RDW: 12.9 % (ref 11.5–15.5)
WBC Count: 5 10*3/uL (ref 4.0–10.5)
nRBC: 0 % (ref 0.0–0.2)

## 2023-03-29 ENCOUNTER — Other Ambulatory Visit: Payer: Self-pay | Admitting: Physician Assistant

## 2023-04-08 DIAGNOSIS — K6289 Other specified diseases of anus and rectum: Secondary | ICD-10-CM | POA: Diagnosis not present

## 2023-04-08 DIAGNOSIS — R194 Change in bowel habit: Secondary | ICD-10-CM | POA: Diagnosis not present

## 2023-04-09 DIAGNOSIS — R194 Change in bowel habit: Secondary | ICD-10-CM | POA: Diagnosis not present

## 2023-04-19 DIAGNOSIS — R141 Gas pain: Secondary | ICD-10-CM | POA: Diagnosis not present

## 2023-04-19 DIAGNOSIS — K5900 Constipation, unspecified: Secondary | ICD-10-CM | POA: Diagnosis not present

## 2023-04-19 DIAGNOSIS — R195 Other fecal abnormalities: Secondary | ICD-10-CM | POA: Diagnosis not present

## 2023-04-19 DIAGNOSIS — Z85048 Personal history of other malignant neoplasm of rectum, rectosigmoid junction, and anus: Secondary | ICD-10-CM | POA: Diagnosis not present

## 2023-04-29 DIAGNOSIS — R972 Elevated prostate specific antigen [PSA]: Secondary | ICD-10-CM | POA: Diagnosis not present

## 2023-05-06 DIAGNOSIS — R972 Elevated prostate specific antigen [PSA]: Secondary | ICD-10-CM | POA: Diagnosis not present

## 2023-05-06 DIAGNOSIS — D49511 Neoplasm of unspecified behavior of right kidney: Secondary | ICD-10-CM | POA: Diagnosis not present

## 2023-05-08 ENCOUNTER — Other Ambulatory Visit: Payer: Self-pay | Admitting: Urology

## 2023-05-08 DIAGNOSIS — R972 Elevated prostate specific antigen [PSA]: Secondary | ICD-10-CM

## 2023-05-08 DIAGNOSIS — D49511 Neoplasm of unspecified behavior of right kidney: Secondary | ICD-10-CM

## 2023-05-13 DIAGNOSIS — K5289 Other specified noninfective gastroenteritis and colitis: Secondary | ICD-10-CM | POA: Diagnosis not present

## 2023-05-13 DIAGNOSIS — Z980 Intestinal bypass and anastomosis status: Secondary | ICD-10-CM | POA: Diagnosis not present

## 2023-05-13 DIAGNOSIS — Z85048 Personal history of other malignant neoplasm of rectum, rectosigmoid junction, and anus: Secondary | ICD-10-CM | POA: Diagnosis not present

## 2023-05-13 DIAGNOSIS — Z08 Encounter for follow-up examination after completed treatment for malignant neoplasm: Secondary | ICD-10-CM | POA: Diagnosis not present

## 2023-05-13 DIAGNOSIS — K5730 Diverticulosis of large intestine without perforation or abscess without bleeding: Secondary | ICD-10-CM | POA: Diagnosis not present

## 2023-05-15 DIAGNOSIS — K5289 Other specified noninfective gastroenteritis and colitis: Secondary | ICD-10-CM | POA: Diagnosis not present

## 2023-05-21 DIAGNOSIS — L728 Other follicular cysts of the skin and subcutaneous tissue: Secondary | ICD-10-CM | POA: Diagnosis not present

## 2023-05-30 ENCOUNTER — Other Ambulatory Visit: Payer: Self-pay | Admitting: Physician Assistant

## 2023-07-11 ENCOUNTER — Ambulatory Visit
Admission: RE | Admit: 2023-07-11 | Discharge: 2023-07-11 | Disposition: A | Discharge status: Home / Self Care | Source: Ambulatory Visit | Attending: Student

## 2023-07-11 ENCOUNTER — Other Ambulatory Visit: Payer: Self-pay | Admitting: Student

## 2023-07-11 ENCOUNTER — Encounter: Payer: Self-pay | Admitting: Hematology

## 2023-07-11 DIAGNOSIS — K5900 Constipation, unspecified: Secondary | ICD-10-CM | POA: Diagnosis not present

## 2023-07-11 DIAGNOSIS — N200 Calculus of kidney: Secondary | ICD-10-CM | POA: Diagnosis not present

## 2023-07-11 DIAGNOSIS — K6289 Other specified diseases of anus and rectum: Secondary | ICD-10-CM | POA: Diagnosis not present

## 2023-07-12 DIAGNOSIS — K5900 Constipation, unspecified: Secondary | ICD-10-CM | POA: Diagnosis not present

## 2023-07-12 DIAGNOSIS — K6289 Other specified diseases of anus and rectum: Secondary | ICD-10-CM | POA: Diagnosis not present

## 2023-07-12 DIAGNOSIS — Z85048 Personal history of other malignant neoplasm of rectum, rectosigmoid junction, and anus: Secondary | ICD-10-CM | POA: Diagnosis not present

## 2023-07-12 DIAGNOSIS — R634 Abnormal weight loss: Secondary | ICD-10-CM | POA: Diagnosis not present

## 2023-07-24 ENCOUNTER — Ambulatory Visit (INDEPENDENT_AMBULATORY_CARE_PROVIDER_SITE_OTHER): Admitting: Physician Assistant

## 2023-07-24 ENCOUNTER — Encounter: Payer: Self-pay | Admitting: Physician Assistant

## 2023-07-24 VITALS — BP 134/79 | HR 79 | Ht 64.0 in | Wt 136.0 lb

## 2023-07-24 DIAGNOSIS — F03A0 Unspecified dementia, mild, without behavioral disturbance, psychotic disturbance, mood disturbance, and anxiety: Secondary | ICD-10-CM | POA: Diagnosis not present

## 2023-07-24 MED ORDER — DONEPEZIL HCL 5 MG PO TABS: ORAL_TABLET | ORAL | 3 refills | Status: DC

## 2023-07-24 MED ORDER — MEMANTINE HCL 5 MG PO TABS: 5.0000 mg | ORAL_TABLET | Freq: Every evening | ORAL | 3 refills | Status: DC

## 2023-07-24 NOTE — Patient Instructions (Addendum)
 It was a pleasure to see you today at our office.   Recommendations:  Meds: Follow up in  Jan 19  at 11:30  Continue  donepezil   5 mg daily.   Continue memantine  5 mg at night  Continue Keppra  as directed          RECOMMENDATIONS FOR ALL PATIENTS WITH MEMORY PROBLEMS: 1. Continue to exercise (Recommend 30 minutes of walking everyday, or 3 hours every week) 2. Increase social interactions - continue going to [place] and enjoy social gatherings with friends and family 3. Eat healthy, avoid fried foods and eat more fruits and vegetables 4. Maintain adequate blood pressure, blood sugar, and blood cholesterol level. Reducing the risk of stroke and cardiovascular disease also helps promoting better memory. 5. Avoid stressful situations. Live a simple life and avoid aggravations. Organize your time and prepare for the next day in anticipation. 6. Sleep well, avoid any interruptions of sleep and avoid any distractions in the bedroom that may interfere with adequate sleep quality 7. Avoid sugar, avoid sweets as there is a strong link between excessive sugar intake, diabetes, and cognitive impairment We discussed the Mediterranean diet, which has been shown to help patients reduce the risk of progressive memory disorders and reduces cardiovascular risk. This includes eating fish, eat fruits and green leafy vegetables, nuts like almonds and hazelnuts, walnuts, and also use olive oil. Avoid fast foods and fried foods as much as possible. Avoid sweets and sugar as sugar use has been linked to worsening of memory function.  There is always a concern of gradual progression of memory problems. If this is the case, then we may need to adjust level of care according to patient needs. Support, both to the patient and caregiver, should then be put into place.    The Alzheimer[employer] Association is here all day, every day for people facing Alzheimer's disease through our free 24/7 Helpline: [phone]. The Helpline  provides reliable information and support to all those who need assistance, such as individuals living with memory loss, Alzheimer's or other dementia, caregivers, health care professionals and the public.  Our highly trained and knowledgeable staff can help you with: Understanding memory loss, dementia and Alzheimer's  Medications and other treatment options  General information about aging and brain health  Skills to provide quality care and to find the best care from professionals  Legal, financial and living-arrangement decisions Our Helpline also features: Confidential care consultation provided by master's level clinicians who can help with decision-making support, crisis assistance and education on issues families face every day  Help in a caller's preferred language using our translation service that features more than 200 languages and dialects  Referrals to local community programs, services and ongoing support     FALL PRECAUTIONS: Be cautious when walking. Scan the area for obstacles that may increase the risk of trips and falls. When getting up in the mornings, sit up at the edge of the bed for a few minutes before getting out of bed. Consider elevating the bed at the head end to avoid drop of blood pressure when getting up. Walk always in a well-lit room (use night lights in the walls). Avoid area rugs or power cords from appliances in the middle of the walkways. Use a walker or a cane if necessary and consider physical therapy for balance exercise. Get your eyesight checked regularly.  FINANCIAL OVERSIGHT: Supervision, especially oversight when making financial decisions or transactions is also recommended.  HOME SAFETY: Consider the safety of  the kitchen when operating appliances like stoves, microwave oven, and blender. Consider having supervision and share cooking responsibilities until no longer able to participate in those. Accidents with firearms and other hazards in the house  should be identified and addressed as well.   ABILITY TO BE LEFT ALONE: If patient is unable to contact 911 operator, consider using LifeLine, or when the need is there, arrange for someone to stay with patients. Smoking is a fire hazard, consider supervision or cessation. Risk of wandering should be assessed by caregiver and if detected at any point, supervision and safe proof recommendations should be instituted.  MEDICATION SUPERVISION: Inability to self-administer medication needs to be constantly addressed. Implement a mechanism to ensure safe administration of the medications.   DRIVING: Regarding driving, in patients with progressive memory problems, driving will be impaired. We advise to have someone else do the driving if trouble finding directions or if minor accidents are reported. Independent driving assessment is available to determine safety of driving.   If you are interested in the driving assessment, you can contact the following:  [facility] [employer] [facility] in [place] [phone]  [facility] [phone]  [facility] [phone] or [phone]      Mediterranean Diet A Mediterranean diet refers to food and lifestyle choices that are based on the traditions of countries located on the [place]. This way of eating has been shown to help prevent certain conditions and improve outcomes for people who have chronic diseases, like kidney disease and heart disease. What are tips for following this plan? Lifestyle  Cook and eat meals together with your family, when possible. Drink enough fluid to keep your urine clear or pale yellow. Be physically active every day. This includes: Aerobic exercise like running or swimming. Leisure activities like gardening, walking, or housework. Get 7-8 hours of sleep each night. If recommended by your health care provider, drink red wine in moderation. This means 1  glass a day for nonpregnant women and 2 glasses a day for men. A glass of wine equals 5 oz (150 mL). Reading food labels  Check the serving size of packaged foods. For foods such as rice and pasta, the serving size refers to the amount of cooked product, not dry. Check the total fat in packaged foods. Avoid foods that have saturated fat or trans fats. Check the ingredients list for added sugars, such as corn syrup. Shopping  At the grocery store, buy most of your food from the areas near the walls of the store. This includes: Fresh fruits and vegetables (produce). Grains, beans, nuts, and seeds. Some of these may be available in unpackaged forms or large amounts (in bulk). Fresh seafood. Poultry and eggs. Low-fat dairy products. Buy whole ingredients instead of prepackaged foods. Buy fresh fruits and vegetables in-season from local farmers markets. Buy frozen fruits and vegetables in resealable bags. If you do not have access to quality fresh seafood, buy precooked frozen shrimp or canned fish, such as tuna, salmon, or sardines. Buy small amounts of raw or cooked vegetables, salads, or olives from the deli or salad bar at your store. Stock your pantry so you always have certain foods on hand, such as olive oil, canned tuna, canned tomatoes, rice, pasta, and beans. Cooking  Cook foods with extra-virgin olive oil instead of using butter or other vegetable oils. Have meat as a side dish, and have vegetables or grains as your main dish. This means having meat in small  portions or adding small amounts of meat to foods like pasta or stew. Use beans or vegetables instead of meat in common dishes like chili or lasagna. Experiment with different cooking methods. Try roasting or broiling vegetables instead of steaming or sauteing them. Add frozen vegetables to soups, stews, pasta, or rice. Add nuts or seeds for added healthy fat at each meal. You can add these to yogurt, salads, or vegetable  dishes. Marinate fish or vegetables using olive oil, lemon juice, garlic, and fresh herbs. Meal planning  Plan to eat 1 vegetarian meal one day each week. Try to work up to 2 vegetarian meals, if possible. Eat seafood 2 or more times a week. Have healthy snacks readily available, such as: Vegetable sticks with hummus. Greek yogurt. Fruit and nut trail mix. Eat balanced meals throughout the week. This includes: Fruit: 2-3 servings a day Vegetables: 4-5 servings a day Low-fat dairy: 2 servings a day Fish, poultry, or lean meat: 1 serving a day Beans and legumes: 2 or more servings a week Nuts and seeds: 1-2 servings a day Whole grains: 6-8 servings a day Extra-virgin olive oil: 3-4 servings a day Limit red meat and sweets to only a few servings a month What are my food choices? Mediterranean diet Recommended Grains: Whole-grain pasta. Brown rice. Bulgar wheat. Polenta. Couscous. Whole-wheat bread. Dwyane Glad. Vegetables: Artichokes. Beets. Broccoli. Cabbage. Carrots. Eggplant. Green beans. Chard. Kale. Spinach. Onions. Leeks. Peas. Squash. Tomatoes. Peppers. Radishes. Fruits: Apples. Apricots. Avocado. Berries. Bananas. Cherries. Dates. Figs. Grapes. Lemons. Melon. Oranges. Peaches. Plums. Pomegranate. Meats and other protein foods: Beans. Almonds. Sunflower seeds. Pine nuts. Peanuts. Cod. Salmon. Scallops. Shrimp. Tuna. Tilapia. Clams. Oysters. Eggs. Dairy: Low-fat milk. Cheese. Greek yogurt. Beverages: Water. Red wine. Herbal tea. Fats and oils: Extra virgin olive oil. Avocado oil. Grape seed oil. Sweets and desserts: [place] yogurt with honey. Baked apples. Poached pears. Trail mix. Seasoning and other foods: Basil. Cilantro. Coriander. Cumin. Mint. Parsley. Sage. Rosemary. Tarragon. Garlic. Oregano. Thyme. Pepper. Balsalmic vinegar. Tahini. Hummus. Tomato sauce. Olives. Mushrooms. Limit these Grains: Prepackaged pasta or rice dishes. Prepackaged cereal with added  sugar. Vegetables: Deep fried potatoes (french fries). Fruits: Fruit canned in syrup. Meats and other protein foods: Beef. Pork. Lamb. Poultry with skin. Hot dogs. Helene Loader. Dairy: Ice cream. Sour cream. Whole milk. Beverages: Juice. Sugar-sweetened soft drinks. Beer. Liquor and spirits. Fats and oils: Butter. Canola oil. Vegetable oil. Beef fat (tallow). Lard. Sweets and desserts: Cookies. Cakes. Pies. Candy. Seasoning and other foods: Mayonnaise. Premade sauces and marinades. The items listed may not be a complete list. Talk with your dietitian about what dietary choices are right for you. Summary The Mediterranean diet includes both food and lifestyle choices. Eat a variety of fresh fruits and vegetables, beans, nuts, seeds, and whole grains. Limit the amount of red meat and sweets that you eat. Talk with your health care provider about whether it is safe for you to drink red wine in moderation. This means 1 glass a day for nonpregnant women and 2 glasses a day for men. A glass of wine equals 5 oz (150 mL). This information is not intended to replace advice given to you by your health care provider. Make sure you discuss any questions you have with your health care provider. Document Released: 09/15/2015 Document Revised: 10/18/2015 Document Reviewed: 09/15/2015 Elsevier Interactive Patient Education  2017 [employer].

## 2023-07-24 NOTE — Progress Notes (Signed)
 Assessment/Plan:   Mild dementia of unclear etiology  Carlos [name] is a delightful 78 y.o. RH male with a history of GAD and mild dementia, likely early stages, possibly due to Alzheimer's disease without behavioral disturbance per neuropsych evaluation on July 2024 seen today in follow up for memory loss. Patient is currently on memantine  5 mg  daily and donepezil  5 mg daily.  MMSE is  stable at 26/30.  He is able to participate on his ADLs and to work at the [employer] without difficulties.  He continues to drive, although caregiver is concerned that sometimes becomes aggressive with driving.  Mood is good but anxious as before.    Follow up in   months. Continue memantine  5 mg daily and donepezil  5 mg daily, side effects discussed  Continue Keppra  as directed due to history of seizures Follow-up with psychology for GAD Recommend good control of her cardiovascular risk factors Continue to control mood as per PCP Monitor driving     Subjective:    This patient is accompanied in the office by his caregiver who supplements the history.  Previous records as well as any outside records available were reviewed prior to todays visit. Patient was last seen on  03/20/23   Any changes in memory since last visit? About the same. He continues to have issues with short-term memory, especially with conversations and new information.  He may miss parts of the conversation.  He enjoys reading the NYT, WSJ.  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  Endorsed he may be asking the same thing within 10 minutes   .   Disoriented when walking into a room? Denies   Leaving objects?  May misplace things but not in unusual places  Wandering behavior?  denies   Any personality changes since last visit?  Denies.   Any worsening depression?:  Denies.   Hallucinations or paranoia?  Denies.   Seizures?  He is on Keppra .  Denies aura, face changes, dizziness, body jerks, muscle  tightness, tongue biting or urine accident.  Any sleep changes?  Sleeps well with mirtazapine .  Denies vivid dreams, REM behavior or sleepwalking   Sleep apnea?   Denies.   Any hygiene concerns? Denies.  Independent of bathing and dressing?  Endorsed  Does the patient needs help with medications? Caregiver is in charge  Who is in charge of the finances?  Sister is in charge    Any changes in appetite?  denies, he eats well.    Patient have trouble swallowing? Denies.   Does the patient cook? No  Any headaches?   Denies.   Any vision changes? Denies  Chronic back pain  denies.    Ambulates with difficulty? Denies.  He is afraid of walking around in the street.  Recent falls or head injuries? Denies.     Unilateral weakness, numbness or tingling? Denies.   Any tremors?  Denies    Any anosmia?  Denies   Any incontinence of urine? Denies    Any bowel dysfunction?  Occasional diarrhea and constipation, follows GI oncology.  On Benefiber    Patient lives alone, has a caregiver 6 times a week from Monday through Saturday from 1030 to 2:30 PM.  Does the patient drive?  His caregiver reports that sometimes his driving is aggressive, denies getting lost.    Neuropsych evaluation 08/2022 Briefly, results suggested severe impairment surrounding both delayed retrieval and recognition/consolidation aspects of memory. Additional weaknesses were exhibited across  processing speed, complex attention, and fine motor coordination bilaterally. Performance variability was exhibited across executive functioning and confrontation naming. Relative to his previous evaluation in December 2020, a primary decline was exhibited across delayed retrieval/recognition aspects of visual memory despite different instrumentation. Mild decline was also exhibited across processing speed and response inhibition. Very subtle decline across semantic fluency remains a possibility; however, scores remained normatively appropriate  overall. The etiology for his likely mild dementia presentation remains difficult to pinpoint. Across memory testing, despite being able to learn information well, he was nearly amnestic (i.e., 0% to 22% retention rates) across all memory tasks. He also exhibited very poor performance across yes/no recognition trials. Taken together, this suggests rapid forgetting and a prominent storage impairment, both of which are hallmark signs of Alzheimer's disease. Variability across confrontation naming would follow typical disease progression. Semantic fluency and visuospatial abilities remained normatively appropriate, which is encouraging. Also encouraging is the fact that Carlos [name]' profile exhibited a good deal of stability given that 3.5 years had passed relative to his previous evaluation. If Alzheimer's disease is present and driving cognitive and functional decline, it would appear to still be in earlier stages and progressing quite slowly at the present time.     History on Initial Assessment 11/07/2018: This is a pleasant 78 year old right-handed man with no significant past medical history presenting for evaluation of memory loss and frequent falls. His sister is present to provide additional information. He thinks his memory is reasonable. He states family has noticed more changes, they ask him things and I don't give good answers. He endorses short-term memory changes. He lives with his 62 year old mother who has 4 caregivers. 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 He denies getting lost driving. He denies any missed bill payments. He is not on any medications. He denies any difficulties at work, he states it is routine work. His sister started noticing changes a year ago, he would forget conversations. He would forget he had already finished a task, for instance that he had already came from the grocery store. She feels symptoms worsened in the past 5-6 months. Last month, he was visiting another  sister and forgot the last portion of the trip, he did not get to his destination and just went home. He has told family he has gotten lost coming home from work, he arrived home an hour later (he has been in the same place for 7 years). His family is also concerned about an increase in frequency of falls. He has had 3 falls in the past 6 months. He reports injuring his foot and hitting his head with a fall in April, then hurting his back in July. He thinks it is due to clumsiness. No loss of consciousness. They are concerned about the possibility of a brain tumor, their father had a brain tumor. There is a history of memory loss in his mother and younger brother (secondary to stroke). He denies any history of significant head injuries or alcohol  use.    He denies any headaches, dizziness, diplopia, dysarthria, dysphagia, neck/back pain, focal numbness/tingling/weakness, bowel/bladder dysfunction, anosmia, or tremors. He felt unwell last night and BP was 151/86, he went home early from work. He states he is on his feet 12 hours a day, and he is now required to work 6 days a week (mandatory due to Covid-19 changes), he was previously working 5 days a week. His sister is concerned he is not eating well, mostly  sugars and hotdogs. He is overly tired and sleepy some days. Mood is good, he has always been hyper, but now it more per sister. No hallucinations or paranoia.    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 PREVIOUS MEDICATIONS:   CURRENT MEDICATIONS:  Outpatient Encounter Medications as of 07/24/2023  Medication Sig   atorvastatin (LIPITOR) 10 MG tablet 1 tablet Orally Once a day for 90 days   Cholecalciferol (VITAMIN D-3) 25 MCG (1000 UT) CAPS Take 2 each by mouth daily.   dicyclomine (BENTYL) 20 MG tablet Take  20 mg by mouth 3 (three) times daily as needed.   famotidine  (PEPCID ) 20 MG tablet Take 1 tablet (20 mg total) by mouth 2 (two) times daily as needed.   levETIRAcetam  (KEPPRA ) 1000 MG tablet Take 1 tablet twice a day (take with Levetiracetam  250mg  tablet for total of 1250mg  twice a day)   levETIRAcetam  (KEPPRA ) 250 MG tablet TAKE 5 TABLETS BY MOUTH TWICE DAILY   mirtazapine  (REMERON ) 15 MG tablet Take one tablet (15 mg) BY MOUTH AT BEDTIME FOR INSOMNIA   Multiple Vitamin (MULTIVITAMIN WITH MINERALS) TABS tablet Take 1 tablet by mouth daily.   OVER THE COUNTER MEDICATION Take 2 tablets by mouth daily. Neuro Q memory pill   Probiotic Product (SUPER PROBIOTIC) CAPS Take 1 capsule by mouth daily.   simethicone  (MYLICON) 125 MG chewable tablet Chew 125 mg by mouth every 6 (six) hours as needed for flatulence.   [DISCONTINUED] donepezil  (ARICEPT ) 5 MG tablet Take one tablet 5 mg daily   [DISCONTINUED] memantine  (NAMENDA ) 5 MG tablet TAKE 1 TABLET(5 MG) BY MOUTH AT BEDTIME   donepezil  (ARICEPT ) 5 MG tablet Take one tablet 5 mg daily   memantine  (NAMENDA ) 5 MG tablet Take 1 tablet (5 mg total) by mouth at bedtime.   No facility-administered encounter medications on file as of 07/24/2023.       07/24/2023   12:00 PM 03/20/2023   12:00 PM 03/23/2022   10:00 AM  MMSE - Mini Mental State Exam  Orientation to time 4 5 4   Orientation to Place 5 5 5   Registration 3 3 3   Attention/ Calculation 5 5 5   Recall 0 0 0  Language- name 2 objects 2 2 2   Language- repeat 1 1 1   Language- follow 3 step command 3 3 3   Language- read & follow direction 1 1 1   Write a sentence 1 1 1   Copy design 1 1 1   Total score 26 27 26       10/25/2020    3:00 PM 11/07/2018    9:00 AM  Montreal Cognitive Assessment   Visuospatial/ Executive (0/5) 2 3  Naming (0/3) 3 3  Attention: Read list of digits (0/2) 1 2  Attention: Read list of letters (0/1) 1 1  Attention: Serial 7 subtraction starting at 100 (0/3) 3 3   Language: Repeat phrase (0/2) 2 2  Language : Fluency (0/1) 1 0  Abstraction (0/2) 2 2  Delayed Recall (0/5) 0 0  Orientation (0/6) 6 6  Total 21 22  Adjusted Score (based on education) 21 22    Objective:     PHYSICAL EXAMINATION:    VITALS:   Vitals:   07/24/23 1137  BP: 134/79  Pulse: 79  SpO2: 99%  Weight: 136 lb (61.7 kg)  Height: 5'  4 (1.626 m)    GEN:  The patient appears stated age and is in NAD. HEENT:  Normocephalic, atraumatic.   Neurological examination:  General: NAD, well-groomed, appears stated age. Orientation: The patient is alert. Oriented to person, place and not to date Cranial nerves: There is good facial symmetry.The speech is fluent and clear. No aphasia or dysarthria. Fund of knowledge is appropriate. Recent and remote memory are impaired. Attention and concentration are reduced. Able to name objects and repeat phrases.  Hearing is intact to conversational tone.  Sensation: Sensation is intact to light touch throughout Motor: Strength is at least antigravity x4. DTR's 2/4 in UE/LE     Movement examination: Tone: There is normal tone in the UE/LE Abnormal movements:  no tremor.  No myoclonus.  No asterixis.   Coordination:  There is no decremation with RAM's. Normal finger to nose  Gait and Station: The patient has no  difficulty arising out of a deep-seated chair without the use of the hands. The patient's stride length is good.  Gait is cautious and narrow.    Thank you for allowing us  the opportunity to participate in the care of this nice patient. Please do not hesitate to contact us  for any questions or concerns.   Total time spent on today's visit was 27 minutes dedicated to this patient today, preparing to see patient, examining the patient, ordering tests and/or medications and counseling the patient, documenting clinical information in the EHR or other health record, independently interpreting results and communicating results to the  patient/family, discussing treatment and goals, answering patient's questions and coordinating care.  Cc:  Ronna Coho, MD  Tex Filbert 07/24/2023 12:41 PM

## 2023-07-29 ENCOUNTER — Ambulatory Visit
Admission: RE | Admit: 2023-07-29 | Discharge: 2023-07-29 | Disposition: A | Discharge status: Home / Self Care | Source: Ambulatory Visit | Attending: Urology | Admitting: Urology

## 2023-07-29 DIAGNOSIS — N400 Enlarged prostate without lower urinary tract symptoms: Secondary | ICD-10-CM | POA: Diagnosis not present

## 2023-07-29 DIAGNOSIS — R972 Elevated prostate specific antigen [PSA]: Secondary | ICD-10-CM

## 2023-07-29 DIAGNOSIS — K5730 Diverticulosis of large intestine without perforation or abscess without bleeding: Secondary | ICD-10-CM | POA: Diagnosis not present

## 2023-07-29 MED ORDER — GADOPICLENOL 0.5 MMOL/ML IV SOLN
7.5000 mL | Freq: Once | INTRAVENOUS | Status: AC | PRN
  Administered 2023-07-29: 7.5 mL via INTRAVENOUS

## 2023-07-30 DIAGNOSIS — D49511 Neoplasm of unspecified behavior of right kidney: Secondary | ICD-10-CM | POA: Diagnosis not present

## 2023-07-30 DIAGNOSIS — R972 Elevated prostate specific antigen [PSA]: Secondary | ICD-10-CM | POA: Diagnosis not present

## 2023-08-01 DIAGNOSIS — N400 Enlarged prostate without lower urinary tract symptoms: Secondary | ICD-10-CM | POA: Diagnosis not present

## 2023-08-01 DIAGNOSIS — K8020 Calculus of gallbladder without cholecystitis without obstruction: Secondary | ICD-10-CM | POA: Diagnosis not present

## 2023-08-01 DIAGNOSIS — K862 Cyst of pancreas: Secondary | ICD-10-CM | POA: Diagnosis not present

## 2023-08-01 DIAGNOSIS — D49511 Neoplasm of unspecified behavior of right kidney: Secondary | ICD-10-CM | POA: Diagnosis not present

## 2023-08-02 ENCOUNTER — Other Ambulatory Visit: Payer: Self-pay | Admitting: Physician Assistant

## 2023-08-05 DIAGNOSIS — R972 Elevated prostate specific antigen [PSA]: Secondary | ICD-10-CM | POA: Diagnosis not present

## 2023-08-05 DIAGNOSIS — D49511 Neoplasm of unspecified behavior of right kidney: Secondary | ICD-10-CM | POA: Diagnosis not present

## 2023-08-23 DIAGNOSIS — Z862 Personal history of diseases of the blood and blood-forming organs and certain disorders involving the immune mechanism: Secondary | ICD-10-CM | POA: Diagnosis not present

## 2023-08-23 DIAGNOSIS — R7309 Other abnormal glucose: Secondary | ICD-10-CM | POA: Diagnosis not present

## 2023-08-23 DIAGNOSIS — E785 Hyperlipidemia, unspecified: Secondary | ICD-10-CM | POA: Diagnosis not present

## 2023-08-28 ENCOUNTER — Encounter: Payer: Self-pay | Admitting: Hematology

## 2023-08-28 DIAGNOSIS — C61 Malignant neoplasm of prostate: Secondary | ICD-10-CM | POA: Diagnosis not present

## 2023-08-28 DIAGNOSIS — Z85048 Personal history of other malignant neoplasm of rectum, rectosigmoid junction, and anus: Secondary | ICD-10-CM | POA: Diagnosis not present

## 2023-08-28 DIAGNOSIS — L609 Nail disorder, unspecified: Secondary | ICD-10-CM | POA: Diagnosis not present

## 2023-08-28 DIAGNOSIS — E119 Type 2 diabetes mellitus without complications: Secondary | ICD-10-CM | POA: Diagnosis not present

## 2023-08-28 DIAGNOSIS — E785 Hyperlipidemia, unspecified: Secondary | ICD-10-CM | POA: Diagnosis not present

## 2023-09-05 ENCOUNTER — Ambulatory Visit (INDEPENDENT_AMBULATORY_CARE_PROVIDER_SITE_OTHER): Admitting: Podiatry

## 2023-09-05 VITALS — Ht 64.0 in | Wt 136.0 lb

## 2023-09-05 DIAGNOSIS — B351 Tinea unguium: Secondary | ICD-10-CM

## 2023-09-05 DIAGNOSIS — M79674 Pain in right toe(s): Secondary | ICD-10-CM

## 2023-09-05 DIAGNOSIS — M79675 Pain in left toe(s): Secondary | ICD-10-CM | POA: Diagnosis not present

## 2023-09-05 DIAGNOSIS — B353 Tinea pedis: Secondary | ICD-10-CM

## 2023-09-05 MED ORDER — KETOCONAZOLE 2 % EX CREA: 1.0000 | TOPICAL_CREAM | Freq: Every day | CUTANEOUS | 2 refills | Status: AC

## 2023-09-05 NOTE — Progress Notes (Signed)
  Subjective:  Patient ID: Carlos [name], male    DOB: Aug 18, 1945,  MRN: [id number]  Chief Complaint  Patient presents with   Bunions    Rm 21 Patient is here bilateral onychomycosis. All nails are thickened and discolored.    78 y.o. male presents with the above complaint. History confirmed with patient.   Objective:  Physical Exam: warm, good capillary refill, no trophic changes or ulcerative lesions, normal DP and PT pulses, normal sensory exam, and tinea pedis with interdigital maceration. Left Foot: dystrophic yellowed discolored nail plates with subungual debris Right Foot: dystrophic yellowed discolored nail plates with subungual debris   Assessment:   1. Pain due to onychomycosis of toenails of both feet   2. Tinea pedis of both feet      Plan:  Patient was evaluated and treated and all questions answered.  Discussed the etiology and treatment options for the condition in detail with the patient. Recommended debridement of the nails today. Sharp and mechanical debridement performed of all painful and mycotic nails today. Nails debrided in length and thickness using a nail nipper to level of comfort. Follow up as needed for painful nails.  I do not think that topical or laser therapy would be effective and he is not a great candidate for oral therapy   Discussed the etiology and treatment options for tinea pedis.  Discussed topical and oral treatment.  Recommended topical treatment with 2% ketoconazole  cream.  This was sent to the patient's pharmacy.  Also discussed appropriate foot hygiene, use of antifungal spray such as Tinactin in shoes, as well as cleaning her foot surfaces such as showers and bathroom floors with bleach.   Return if symptoms worsen or fail to improve.

## 2023-09-11 DIAGNOSIS — R634 Abnormal weight loss: Secondary | ICD-10-CM | POA: Diagnosis not present

## 2023-09-11 DIAGNOSIS — K6289 Other specified diseases of anus and rectum: Secondary | ICD-10-CM | POA: Diagnosis not present

## 2023-09-11 DIAGNOSIS — K5900 Constipation, unspecified: Secondary | ICD-10-CM | POA: Diagnosis not present

## 2023-09-11 DIAGNOSIS — Z85048 Personal history of other malignant neoplasm of rectum, rectosigmoid junction, and anus: Secondary | ICD-10-CM | POA: Diagnosis not present

## 2023-09-19 ENCOUNTER — Ambulatory Visit: Payer: Medicare Other | Admitting: Physician Assistant

## 2023-09-20 ENCOUNTER — Other Ambulatory Visit: Payer: Self-pay

## 2023-09-20 DIAGNOSIS — N2889 Other specified disorders of kidney and ureter: Secondary | ICD-10-CM

## 2023-09-20 DIAGNOSIS — C20 Malignant neoplasm of rectum: Secondary | ICD-10-CM

## 2023-09-21 ENCOUNTER — Other Ambulatory Visit: Payer: Self-pay | Admitting: Physician Assistant

## 2023-09-22 NOTE — Progress Notes (Unsigned)
 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  He started concurrent chemo RT with Xeloda  on 11/02/2019 - 12/07/19 -S/p LAR at [facility] 02/08/20, path showed ypT1 pN0, clear margins, no high risk features such as perforation, LVI, or PNI -Surveillance colonoscopy 09/2020 was benign, 3-year recall -Surveillance CTs showed NED, last done 03/2022. Repeat as clinically indicated at this point -[name] is clinically doing well, no concerns, exam is benign and labs are unremarkable. Overall no clinical concern for recurrence -He is 4 years from definitive surgery, recurrence risk, reviewed with the  patient, has decreased. Continue surveillance -reviewed s/sx to notify, including unintentional weight loss, change in bowel habits, black or bloody stools, abdominal pain/bloating, or significant fatigue -colonoscopy due in 2025. Message sent to Dr. Saintclair to inquire about scheduling this. -followed by urology for new prostate lesion and stable renal mass.  -Follow-up in 6 months, or sooner if needed   History of prostate cancer  The patient os followed by urology. He had recent MRI and has a new category 3 lesion in the right lobe of prostate gland. He has consultation later today with the urologist. Will be discussing hormone therapy for management.   Renal mass of right kidney Most recent CT CAP showing stable 2.3 right renal mass. There  is concern for renal cell carcinoma. Urology has discussed removal of the right kidney if this continues to enlarge.    Adenocarcinoma of the rectum Surveillance scans currently not warranted. He is due for repeat colonoscopy. Will reach out to Dr. Saintclair, who performed his most recent oneiroscopy in 2022, to set up new colonoscopy.   Plan Labs reviewed.  -CBC is unremarkable -CMP is unremarkable.  Due for repeat colonoscopy. GI provider is Dr. Saintclair.  Follow up with urology for management of prostate and kidney lesions.  Labs and follow up in clinic in 6 months, sooner if needed.   The patient understands the plans discussed today and is in agreement with them.  He knows to contact our office if he develops concerns prior to his next appointment.  I provided 25 minutes of face-to-face time during this encounter and > 50% was spent counseling as documented under my assessment and plan.    Carlos FORBES Lessen, NP  [facility] - A DEPT OF [facility] [address] [place] [address] Dept: [phone] Dept Fax: [phone]   No orders of the defined types were placed in this encounter.     CHIEF COMPLAINT:  CC: Follow-up of rectal cancer  Current Treatment: Surveillance  INTERVAL HISTORY:  [name] is here today for repeat clinical assessment.  He was last seen by me on 03/25/2023.  He reports feeling well. He denies unusual bleeding, such as blood in the stool, hemoptysis, or hematemesis. He continues to work full time at the [employer]. Likes to stay very active. He has a new lesion present in his prostate gland and he  has a consultation with urology later today. Urology also follows a mass on the right kidney. denies chest pain, chest pressure, or shortness of breath. He denies headaches or visual disturbances. He denies abdominal pain, nausea, vomiting, or changes in bowel or bladder habits.  He denies fevers or  chills. He denies pain. His appetite is good. His weight has decreased 4 pounds over last 2 months.  I have reviewed the past medical history, past surgical history, social history and family history with the patient and they are unchanged from previous note.  ALLERGIES:  has no known allergies.  MEDICATIONS:  Current Outpatient Medications  Medication Sig Dispense Refill   atorvastatin (LIPITOR) 10 MG tablet 1 tablet Orally Once a day for 90 days     Cholecalciferol (VITAMIN D-3) 25 MCG (1000 UT) CAPS Take 2 each by mouth daily.     dicyclomine (BENTYL) 20 MG tablet Take 20 mg by mouth 3 (three) times daily as needed.     donepezil  (ARICEPT ) 5 MG tablet TAKE 1 TABLET(5 MG) BY MOUTH DAILY 90 tablet 1   famotidine  (PEPCID ) 20 MG tablet Take 1 tablet (20 mg total) by mouth 2 (two) times daily as needed. 90 tablet 0   ketoconazole  (NIZORAL ) 2 % cream Apply 1 Application topically daily. 60 g 2   levETIRAcetam  (KEPPRA ) 1000 MG tablet Take 1 tablet twice a day (take with Levetiracetam  250mg  tablet for total of 1250mg  twice a day) 180 tablet 1   levETIRAcetam  (KEPPRA ) 250 MG tablet TAKE 5 TABLETS BY MOUTH TWICE DAILY 900 tablet 1   memantine  (NAMENDA ) 5 MG tablet Take 1 tablet (5 mg total) by mouth at bedtime. 90 tablet 3   mirtazapine  (REMERON ) 15 MG tablet Take one tablet (15 mg) BY MOUTH AT BEDTIME FOR INSOMNIA 30 tablet 2   Multiple Vitamin (MULTIVITAMIN WITH MINERALS) TABS tablet Take 1 tablet by mouth daily.     OVER THE COUNTER MEDICATION Take 2 tablets by mouth daily. Neuro Q memory pill     Probiotic Product (SUPER PROBIOTIC) CAPS Take 1 capsule by mouth daily.     simethicone  (MYLICON) 125 MG chewable tablet Chew 125 mg by mouth every 6 (six) hours as needed for flatulence.     No current facility-administered medications for this visit.    HISTORY OF PRESENT ILLNESS:   Oncology History Overview Note  Cancer Staging Rectal adenocarcinoma [facility]) Staging form: Colon and Rectum, AJCC  8th Edition - Clinical stage from 06/12/2019: cT4, cN1 - Unsigned Stage prefix: Initial diagnosis - Pathologic stage from 02/08/2020: Stage I (ypT1, pN0, cM0) - Signed by Carlos Callander, MD on 04/06/2020 Stage prefix: Post-therapy Total positive nodes: 0 Residual tumor (R): R0 - None    Adenocarcinoma of rectum  05/29/2019 Procedure   Colonoscopy per Dr. Saintclair A digital rectal exam was normal  frond-like fungating infiltrative non-obstructing large mass in the rectum. The mass was partially circumferential, measured 5 cm in length. The colonoscopy also showed 11 sessile polyps in the transverse and asecnding colon and hepatic flexure.    05/29/2019 Initial Biopsy   Pathology on the rectal biopsy showed at least intramucosal adenocarcinoma involving tubulovillous adenoma with high grade dysplasia.   06/05/2019 Imaging   CT CAP IMPRESSION: 1. Cholelithiasis and gallbladder wall thickening with equivocal pericholecystic inflammation. Acute cholecystitis not excluded. If there is clinical suspicion for acute cholecystitis, recommend ultrasound evaluation. 2. Approximately 5 cm irregular mass within the distal sigmoid colon with possible extension through the wall. No definite abnormal  adjacent lymph nodes. 3. Mild omental haziness-nonspecific. Metastatic disease not excluded. 4. Indeterminate 3-4 mm RIGHT pulmonary nodules which could represent metastatic disease. These are too small for PET CT evaluation. Consider CT follow-up. 5. 2.5 cm irregular cystic mass within the RIGHT kidney. Elective MRI recommended for further evaluation. 6. 4 mm nonobstructing LEFT LOWER pole renal calculus. 7. Coronary artery disease. 8. Aortic Atherosclerosis (ICD10-I70.0).   06/12/2019 Imaging   Staging MRI pelvis  FINDINGS: TUMOR LOCATION Tumor distance from Anal Verge/Skin Surface:  11.4 cm Tumor distance to Internal Anal Sphincter: 6.5 cm TUMOR DESCRIPTION Circumferential Extent: 100% Tumor Length: 8.3  cm T - CATEGORY Extension through Muscularis Propria: Yes>60mm=T3d Shortest Distance of any tumor/node from Mesorectal Fascia: 0 mm, along the left lateral and posterior walls (tumor abuts the left lateral pelvic sidewall and sacrum) Extramural Vascular Invasion/Tumor Thrombus: No Invasion of Anterior Peritoneal Reflection: No Involvement of Adjacent Organs or Pelvic Sidewall: Yes, involving the left lateral pelvic sidewall =T4 Levator Ani Involvement: No N - CATEGORY Mesorectal Lymph Nodes >=73mm: 2=N1 Extra-mesorectal Lymphadenopathy: No Other:  None. IMPRESSION: Rectal adenocarcinoma T stage: T4 Rectal adenocarcinoma N stage:  N1 Distance from tumor to the internal anal sphincter is 6.5 cm.   06/18/2019 Initial Diagnosis   Rectal adenocarcinoma (HCC)   07/02/2019 - 10/08/2019 Chemotherapy   Total Neoadjuvant FOLFOX q2weeks starting 07/02/19-10/08/19,  8 cycles   07/13/2019 PET scan   IMPRESSION: 1. Known rectal mass is intensely hypermetabolic compatible with primary rectal adenocarcinoma. No findings of FDG avid nodal metastasis or distant metastatic disease. 2. Hyperdense kidney cysts are identified bilaterally favored to represent hemorrhagic cyst. 3. Nonobstructing left renal calculi. 4. Aortic atherosclerosis, in addition to lad coronary artery disease. Please note that although the presence of coronary artery calcium  documents the presence of coronary artery disease, the severity of this disease and any potential stenosis cannot be assessed on this non-gated CT examination. Assessment for potential risk factor modification, dietary therapy or pharmacologic therapy may be warranted, if clinically indicated. Aortic Atherosclerosis (ICD10-I70.0). 5. Gallstones.   09/11/2019 - 09/14/2019 Hospital Admission   Acute AMS, seizure 09/11/19 -Developed acute altered mental status day after Cycle 6 chemo (09/11/19).  -He was admitted for work up, EEG showed epileptic spike. ID work up  negative, MRI negative for metastasis.    09/11/2019 Imaging   MRI Brain  IMPRESSION: Incomplete study. Images obtained reveal no acute abnormality. Negative for acute infarct.   Atrophy and mild ventricular enlargement stable from prior studies.   09/11/2019 Imaging   CT head  IMPRESSION: Mild ventricular prominence with normal appearing sulci. Question early communicating hydrocephalus. Brain parenchyma appears unremarkable. No acute infarct. No mass or hemorrhage.   Foci of arterial vascular calcification noted. There is mucosal thickening in several ethmoid air cells.   There is probable cerumen in the right external auditory canal.     09/23/2019 Imaging   CT AP  IMPRESSION: 1. Solid-appearing left eccentric upper rectal mass measures about 6.7 by 2.9 cm. This is difficult to compare directly to the prior exam given differences in orientation and degree of distension of the rectum. There is some prominence of stool in the sigmoid colon and rectum on today's examination, but the mass is not obstructive. 2. Other imaging findings of potential clinical significance: Cholelithiasis. Complex bilateral renal lesions are similar to prior exams. Nonobstructive left nephrolithiasis. Mild prostatomegaly with nodular prominence the upper margin of the prostate gland. Lumbar spondylosis and degenerative disc disease causing multilevel foraminal impingement. 3. Aortic  atherosclerosis.   Aortic Atherosclerosis (ICD10-I70.0).   11/02/2019 - 12/09/2019 Chemotherapy   Concurrent chemo RT with Oral Xeloda  1500mg  BID on days of RT starting 11/02/19-12/09/19   11/02/2019 - 12/09/2019 Radiation Therapy   Concurrent chemo RT by Dr Dewey with Xeloda   starting 11/02/19-12/09/19   01/13/2020 Imaging   MRI at [facility] 1.  Reduced size of the previously demonstrated high rectal mass. The residual tumor measures 2.4 cm in maximal diameter and is primarily endoluminal. There is a similar amount of extramural  disease spread into the left mesorectal space and presacral space.  2.  Improved appearance of lymphadenopathy with several conspicuous mesorectal and internal iliac lymph nodes.  3.  Small volume of nonspecific free fluid in the pelvis.   01/29/2020 Imaging   CT CAP  IMPRESSION: 1. Interval response to therapy of rectosigmoid junction primary. 2. No findings of metastatic disease within the chest, abdomen, or pelvis. 3. Small pulmonary nodules, similar and decreased as detailed above. Nonspecific. 4. Right-sided Port-A-Cath tip at high IVC. consider repositioning. 5. Cholelithiasis. 6. Left nephrolithiasis. 7. Bilateral renal lesions. A medial upper pole renal lesion is suspicious for renal cell carcinoma. This could be re-evaluated at follow-up or more entirely characterized with routine outpatient pre and post contrast abdominal MRI.   02/08/2020 Surgery   LOWER ANTERIOR RESECTION RECTUM LAPAROSCOPIC by Dr Darral at [facility]   Procedures   PR LAP,SURG,COLECTOMY, PARTIAL, W/ANAST   PR LAP, SURG MOBIL SPLENIC FL DUR PTL COLECTOMY   PR LAP, SURG ILEO/JEJUNO-STOMY   SIGMOID COLECTOMY LAPAROSCOPIC ASSISTED     02/08/2020 Pathology Results   Final Pathologic Diagnosis at [facility]     A.  LYMPH NODE, IMA, EXCISION:              Two lymph nodes are negative for metastatic carcinoma (0/2).   B.  SIGMOID COLON AND RECTUM, LOWER ANTERIOR RESECTION: Residual well differentiated invasive adenocarcinoma of the rectum, arising from a tubulovillous adenoma with high grade dyplasia.               Size: 2 mm in greatest dimension.  Tumor invades the submucosa.  Negative for perineural and lymphovascular invasion. Resection margins are negative for malignancy or dysplasia.  Twenty-two lymph nodes are negative for metastatic carcinoma (0/22). Complete mesorectum.               See CAP cancer protocol summary and comment.   C.  PROXIMAL DOUGHNUT, RESECTION:                       Segment of benign colonic tissue.              Negative for malignancy or dyplasia.    D.  DISTAL DOUGHNUT, RESECTION:                            Segment of benign colonic tissue.              Negative for malignancy or dyplasia.    PATHOLOGIC STAGE CLASSIFICATION (pTNM, AJCC 8th Edition)         TNM Descriptors:    y (post-treatment)     Primary Tumor (pT):    pT1     Regional Lymph Nodes (pN):    pN0      02/08/2020 Cancer Staging   Staging form: Colon and Rectum, AJCC 8th Edition - Pathologic stage from 02/08/2020: Stage I (ypT1, pN0,  cM0) - Signed by Carlos Callander, MD on 04/06/2020 Stage prefix: Post-therapy Total positive nodes: 0 Residual tumor (R): R0 - None   07/05/2020 Imaging   CT C/A/P w/o contrast  IMPRESSION: 1. Mildly indistinct appearance of the fat and gallbladder wall separation in the upper abdomen, of uncertain significance and in the setting of abundant gallstones in the gallbladder lumen. Correlate with any clinical signs of abdominal pain and consider further evaluation with ultrasound as warranted. 2. RIGHT-sided Port-A-Cath tip at the high IVC near the diaphragmatic hiatus. Could consider repositioning as warranted. 3. 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 Consider follow-up evaluation, renal protocol study with with MRI with and without contrast as outlined in previous imaging. Hemorrhagic and/or proteinaceous cysts elsewhere in the kidneys. 4. LEFT nephrolithiasis as before with lower pole calculus approximately 6 mm. 5. No signs of metastatic disease with abundant stool in the colon following partial colonic resection. Correlate with any signs of constipation. 6. 4 mm nodule in the superior segment of the RIGHT lower lobe is unchanged. Attention on follow-up 7. Small midline periumbilical hernia containing fat. 8. Aortic atherosclerosis.   03/26/2022 Imaging    IMPRESSION: No evidence of recurrent or metastatic colon  carcinoma.   Stable 2.3 cm solid right renal mass. Renal cell carcinoma cannot be excluded.   Cholelithiasis. No radiographic evidence of cholecystitis.   Colonic diverticulosis. No radiographic evidence of diverticulitis.   Stable mildly enlarged prostate.       REVIEW OF SYSTEMS:   Constitutional: Denies fevers, chills or abnormal weight loss Eyes: Denies blurriness of vision Ears, nose, mouth, throat, and face: Denies mucositis or sore throat Respiratory: Denies cough, dyspnea or wheezes Cardiovascular: Denies palpitation, chest discomfort or lower extremity swelling Gastrointestinal:  Denies nausea, heartburn or change in bowel habits Skin: Denies abnormal skin rashes Lymphatics: Denies new lymphadenopathy or easy bruising Neurological:Denies numbness, tingling or new weaknesses Behavioral/Psych: Mood is stable, no new changes  All other systems were reviewed with the patient and are negative.   VITALS:   Today's Vitals   09/23/23 0912 09/23/23 0918  BP: 138/80   Pulse: 60   Resp: 17   Temp: 98.1 F (36.7 C)   SpO2: 98%   Weight: 132 lb 3.2 oz (60 kg)   PainSc:  0-No pain   Body mass index is 22.69 kg/m.   Wt Readings from Last 3 Encounters:  09/23/23 132 lb 3.2 oz (60 kg)  09/05/23 136 lb (61.7 kg)  07/24/23 136 lb (61.7 kg)    Body mass index is 22.69 kg/m.  Performance status (ECOG): 1 - Symptomatic but completely ambulatory  PHYSICAL EXAM:   GENERAL:alert, no distress and comfortable SKIN: skin color, texture, turgor are normal, no rashes or significant lesions EYES: normal, Conjunctiva are pink and non-injected, sclera clear OROPHARYNX:no exudate, no erythema and lips, buccal mucosa, and tongue normal  NECK: supple, thyroid normal size, non-tender, without nodularity LYMPH:  no palpable lymphadenopathy in the cervical, axillary or inguinal LUNGS: clear to auscultation and percussion with normal breathing effort HEART: regular rate & rhythm and  no murmurs and no lower extremity edema ABDOMEN:abdomen soft, non-tender and normal bowel sounds Musculoskeletal:no cyanosis of digits and no clubbing  NEURO: alert & oriented x 3 with fluent speech, no focal motor/sensory deficits  LABORATORY DATA:  I have reviewed the data as listed    Component Value Date/Time   NA 143 09/23/2023 0901   K 4.0 09/23/2023 0901   CL 109 09/23/2023  0901   CO2 28 09/23/2023 0901   GLUCOSE 97 09/23/2023 0901   BUN 18 09/23/2023 0901   CREATININE 0.90 09/23/2023 0901   CALCIUM  9.1 09/23/2023 0901   PROT 6.8 09/23/2023 0901   ALBUMIN 4.3 09/23/2023 0901   AST 21 09/23/2023 0901   ALT 18 09/23/2023 0901   ALKPHOS 68 09/23/2023 0901   BILITOT 0.5 09/23/2023 0901   GFRNONAA >60 09/23/2023 0901   GFRAA >60 11/09/2019 0947    Lab Results  Component Value Date   WBC 4.3 09/23/2023   NEUTROABS 2.0 09/23/2023   HGB 13.2 09/23/2023   HCT 39.2 09/23/2023   MCV 88.9 09/23/2023   PLT 185 09/23/2023

## 2023-09-22 NOTE — Assessment & Plan Note (Signed)
 [id number] stage III -Diagnosed in 05/2019 with locally advanced stage III rectal cancer  -Completed neoadjuvant chemo with 8 cycles of FOLFOX from 5/2 7/21-10/08/2019.  He started concurrent chemo RT with Xeloda on 11/02/2019 - 12/07/19 -S/p LAR at [facility] 02/08/20, path showed ypT1 pN0, clear margins, no high risk features such as perforation, LVI, or PNI -Surveillance colonoscopy 09/2020 was benign, 3-year recall -Surveillance CTs showed NED, last done 03/2022. Repeat as clinically indicated at this point -Mr. Helbert is clinically doing well, no concerns, exam is benign and labs are unremarkable. Overall no clinical concern for recurrence -He is 3 years from definitive surgery, recurrence risk, reviewed with the  patient, has decreased. Continue surveillance -reviewed s/sx to notify, including unintentional weight loss, change in bowel habits, black or bloody stools, abdominal pain/bloating, or significant fatigue -Follow-up in 6 months, or sooner if needed

## 2023-09-23 ENCOUNTER — Inpatient Hospital Stay: Payer: Medicare Other | Attending: Nurse Practitioner

## 2023-09-23 ENCOUNTER — Encounter: Payer: Self-pay | Admitting: Nurse Practitioner

## 2023-09-23 ENCOUNTER — Ambulatory Visit (HOSPITAL_BASED_OUTPATIENT_CLINIC_OR_DEPARTMENT_OTHER): Payer: Medicare Other | Admitting: Nurse Practitioner

## 2023-09-23 VITALS — BP 138/80 | HR 60 | Temp 98.1°F | Resp 17 | Wt 132.2 lb

## 2023-09-23 DIAGNOSIS — N2889 Other specified disorders of kidney and ureter: Secondary | ICD-10-CM

## 2023-09-23 DIAGNOSIS — N429 Disorder of prostate, unspecified: Secondary | ICD-10-CM | POA: Insufficient documentation

## 2023-09-23 DIAGNOSIS — C20 Malignant neoplasm of rectum: Secondary | ICD-10-CM | POA: Insufficient documentation

## 2023-09-23 DIAGNOSIS — R972 Elevated prostate specific antigen [PSA]: Secondary | ICD-10-CM | POA: Diagnosis not present

## 2023-09-23 DIAGNOSIS — D49511 Neoplasm of unspecified behavior of right kidney: Secondary | ICD-10-CM | POA: Diagnosis not present

## 2023-09-23 DIAGNOSIS — Z8546 Personal history of malignant neoplasm of prostate: Secondary | ICD-10-CM | POA: Insufficient documentation

## 2023-09-23 LAB — CBC WITH DIFFERENTIAL (CANCER CENTER ONLY)
Abs Immature Granulocytes: 0.01 K/uL (ref 0.00–0.07)
Basophils Absolute: 0 K/uL (ref 0.0–0.1)
Basophils Relative: 1 %
Eosinophils Absolute: 0.1 K/uL (ref 0.0–0.5)
Eosinophils Relative: 1 %
HCT: 39.2 % (ref 39.0–52.0)
Hemoglobin: 13.2 g/dL (ref 13.0–17.0)
Immature Granulocytes: 0 %
Lymphocytes Relative: 41 %
Lymphs Abs: 1.8 K/uL (ref 0.7–4.0)
MCH: 29.9 pg (ref 26.0–34.0)
MCHC: 33.7 g/dL (ref 30.0–36.0)
MCV: 88.9 fL (ref 80.0–100.0)
Monocytes Absolute: 0.5 K/uL (ref 0.1–1.0)
Monocytes Relative: 11 %
Neutro Abs: 2 K/uL (ref 1.7–7.7)
Neutrophils Relative %: 46 %
Platelet Count: 185 K/uL (ref 150–400)
RBC: 4.41 MIL/uL (ref 4.22–5.81)
RDW: 13.2 % (ref 11.5–15.5)
WBC Count: 4.3 K/uL (ref 4.0–10.5)
nRBC: 0 % (ref 0.0–0.2)

## 2023-09-23 LAB — CMP (CANCER CENTER ONLY)
ALT: 18 U/L (ref 0–44)
AST: 21 U/L (ref 15–41)
Albumin: 4.3 g/dL (ref 3.5–5.0)
Alkaline Phosphatase: 68 U/L (ref 38–126)
Anion gap: 6 (ref 5–15)
BUN: 18 mg/dL (ref 8–23)
CO2: 28 mmol/L (ref 22–32)
Calcium: 9.1 mg/dL (ref 8.9–10.3)
Chloride: 109 mmol/L (ref 98–111)
Creatinine: 0.9 mg/dL (ref 0.61–1.24)
GFR, Estimated: >60 mL/min (ref >60)
Glucose, Bld: 97 mg/dL (ref 70–99)
Potassium: 4 mmol/L (ref 3.5–5.1)
Sodium: 143 mmol/L (ref 135–145)
Total Bilirubin: 0.5 mg/dL (ref 0.0–1.2)
Total Protein: 6.8 g/dL (ref 6.5–8.1)

## 2023-11-04 DIAGNOSIS — Z23 Encounter for immunization: Secondary | ICD-10-CM | POA: Diagnosis not present

## 2023-11-11 ENCOUNTER — Encounter: Payer: Self-pay | Admitting: Hematology

## 2023-12-01 ENCOUNTER — Other Ambulatory Visit: Payer: Self-pay | Admitting: Physician Assistant

## 2023-12-06 DIAGNOSIS — Z85048 Personal history of other malignant neoplasm of rectum, rectosigmoid junction, and anus: Secondary | ICD-10-CM | POA: Diagnosis not present

## 2024-01-07 DIAGNOSIS — D49511 Neoplasm of unspecified behavior of right kidney: Secondary | ICD-10-CM | POA: Diagnosis not present

## 2024-01-17 DIAGNOSIS — C189 Malignant neoplasm of colon, unspecified: Secondary | ICD-10-CM | POA: Diagnosis not present

## 2024-01-17 DIAGNOSIS — N200 Calculus of kidney: Secondary | ICD-10-CM | POA: Diagnosis not present

## 2024-01-17 DIAGNOSIS — D49511 Neoplasm of unspecified behavior of right kidney: Secondary | ICD-10-CM | POA: Diagnosis not present

## 2024-01-17 DIAGNOSIS — K5730 Diverticulosis of large intestine without perforation or abscess without bleeding: Secondary | ICD-10-CM | POA: Diagnosis not present

## 2024-02-04 ENCOUNTER — Telehealth: Payer: Self-pay | Admitting: Physician Assistant

## 2024-02-04 NOTE — Telephone Encounter (Signed)
 Pt's sister Sharlet called this afternoon and she stated that Pt had an episode at work today.  Sharlet wants to discuss the confusion that her brother had at work today. Please call. Thanks

## 2024-02-04 NOTE — Telephone Encounter (Signed)
 [name] is on the DPR, will call in a few minutes

## 2024-02-04 NOTE — Telephone Encounter (Signed)
 I left message to call office back or to send my chart message to us . Need to find out more information regarding confusion.

## 2024-02-17 ENCOUNTER — Encounter: Payer: Self-pay | Admitting: Hematology
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
## 2024-02-24 ENCOUNTER — Ambulatory Visit: Admitting: Physician Assistant

## 2024-02-24 ENCOUNTER — Encounter: Payer: Self-pay | Admitting: Physician Assistant

## 2024-02-24 VITALS — BP 149/82 | HR 87 | Resp 20 | Ht 64.0 in | Wt 132.0 lb

## 2024-02-24 DIAGNOSIS — F03A0 Unspecified dementia, mild, without behavioral disturbance, psychotic disturbance, mood disturbance, and anxiety: Secondary | ICD-10-CM | POA: Diagnosis not present

## 2024-02-24 MED ORDER — MEMANTINE HCL 10 MG PO TABS: 10.0000 mg | ORAL_TABLET | Freq: Every evening | ORAL | 3 refills | Status: AC

## 2024-02-24 MED ORDER — LEVETIRACETAM 250 MG PO TABS: ORAL_TABLET | ORAL | 3 refills | Status: AC

## 2024-02-24 NOTE — Patient Instructions (Addendum)
 It was a pleasure to see you today at our office.   Recommendations:  Meds: Follow up in  August 11 at 11:30  Continue  donepezil   5 mg daily.   Increase memantine  10 mg at night  Continue Keppra  as directed          RECOMMENDATIONS FOR ALL PATIENTS WITH MEMORY PROBLEMS: 1. Continue to exercise (Recommend 30 minutes of walking everyday, or 3 hours every week) 2. Increase social interactions - continue going to [place] and enjoy social gatherings with friends and family 3. Eat healthy, avoid fried foods and eat more fruits and vegetables 4. Maintain adequate blood pressure, blood sugar, and blood cholesterol level. Reducing the risk of stroke and cardiovascular disease also helps promoting better memory. 5. Avoid stressful situations. Live a simple life and avoid aggravations. Organize your time and prepare for the next day in anticipation. 6. Sleep well, avoid any interruptions of sleep and avoid any distractions in the bedroom that may interfere with adequate sleep quality 7. Avoid sugar, avoid sweets as there is a strong link between excessive sugar intake, diabetes, and cognitive impairment We discussed the Mediterranean diet, which has been shown to help patients reduce the risk of progressive memory disorders and reduces cardiovascular risk. This includes eating fish, eat fruits and green leafy vegetables, nuts like almonds and hazelnuts, walnuts, and also use olive oil. Avoid fast foods and fried foods as much as possible. Avoid sweets and sugar as sugar use has been linked to worsening of memory function.  There is always a concern of gradual progression of memory problems. If this is the case, then we may need to adjust level of care according to patient needs. Support, both to the patient and caregiver, should then be put into place.    The [employer] is here all day, every day for people facing Alzheimers disease through our free 24/7 Helpline: [phone]. The  Helpline provides reliable information and support to all those who need assistance, such as individuals living with memory loss, Alzheimer's or other dementia, caregivers, health care professionals and the public.  Our highly trained and knowledgeable staff can help you with: Understanding memory loss, dementia and Alzheimer's  Medications and other treatment options  General information about aging and brain health  Skills to provide quality care and to find the best care from professionals  Legal, financial and living-arrangement decisions Our Helpline also features: Confidential care consultation provided by master's level clinicians who can help with decision-making support, crisis assistance and education on issues families face every day  Help in a caller's preferred language using our translation service that features more than 200 languages and dialects  Referrals to local community programs, services and ongoing support     FALL PRECAUTIONS: Be cautious when walking. Scan the area for obstacles that may increase the risk of trips and falls. When getting up in the mornings, sit up at the edge of the bed for a few minutes before getting out of bed. Consider elevating the bed at the head end to avoid drop of blood pressure when getting up. Walk always in a well-lit room (use night lights in the walls). Avoid area rugs or power cords from appliances in the middle of the walkways. Use a walker or a cane if necessary and consider physical therapy for balance exercise. Get your eyesight checked regularly.  FINANCIAL OVERSIGHT: Supervision, especially oversight when making financial decisions or transactions is also recommended.  HOME SAFETY: Consider the safety of the  kitchen when operating appliances like stoves, microwave oven, and blender. Consider having supervision and share cooking responsibilities until no longer able to participate in those. Accidents with firearms and other hazards in  the house should be identified and addressed as well.   ABILITY TO BE LEFT ALONE: If patient is unable to contact 911 operator, consider using LifeLine, or when the need is there, arrange for someone to stay with patients. Smoking is a fire hazard, consider supervision or cessation. Risk of wandering should be assessed by caregiver and if detected at any point, supervision and safe proof recommendations should be instituted.  MEDICATION SUPERVISION: Inability to self-administer medication needs to be constantly addressed. Implement a mechanism to ensure safe administration of the medications.   DRIVING: Regarding driving, in patients with progressive memory problems, driving will be impaired. We advise to have someone else do the driving if trouble finding directions or if minor accidents are reported. Independent driving assessment is available to determine safety of driving.   If you are interested in the driving assessment, you can contact the following:  [facility] [employer] [facility] in [place] [phone]  [facility] [phone]  [facility] [phone] or [phone]      Mediterranean Diet A Mediterranean diet refers to food and lifestyle choices that are based on the traditions of countries located on the [employer]. This way of eating has been shown to help prevent certain conditions and improve outcomes for people who have chronic diseases, like kidney disease and heart disease. What are tips for following this plan? Lifestyle  Cook and eat meals together with your family, when possible. Drink enough fluid to keep your urine clear or pale yellow. Be physically active every day. This includes: Aerobic exercise like running or swimming. Leisure activities like gardening, walking, or housework. Get 7-8 hours of sleep each night. If recommended by your health care provider, drink red wine in moderation. This  means 1 glass a day for nonpregnant women and 2 glasses a day for men. A glass of wine equals 5 oz (150 mL). Reading food labels  Check the serving size of packaged foods. For foods such as rice and pasta, the serving size refers to the amount of cooked product, not dry. Check the total fat in packaged foods. Avoid foods that have saturated fat or trans fats. Check the ingredients list for added sugars, such as corn syrup. Shopping  At the grocery store, buy most of your food from the areas near the walls of the store. This includes: Fresh fruits and vegetables (produce). Grains, beans, nuts, and seeds. Some of these may be available in unpackaged forms or large amounts (in bulk). Fresh seafood. Poultry and eggs. Low-fat dairy products. Buy whole ingredients instead of prepackaged foods. Buy fresh fruits and vegetables in-season from local farmers markets. Buy frozen fruits and vegetables in resealable bags. If you do not have access to quality fresh seafood, buy precooked frozen shrimp or canned fish, such as tuna, salmon, or sardines. Buy small amounts of raw or cooked vegetables, salads, or olives from the deli or salad bar at your store. Stock your pantry so you always have certain foods on hand, such as olive oil, canned tuna, canned tomatoes, rice, pasta, and beans. Cooking  Cook foods with extra-virgin olive oil instead of using butter or other vegetable oils. Have meat as a side dish, and have vegetables or grains as your main dish. This means having meat in small portions  or adding small amounts of meat to foods like pasta or stew. Use beans or vegetables instead of meat in common dishes like chili or lasagna. Experiment with different cooking methods. Try roasting or broiling vegetables instead of steaming or sauteing them. Add frozen vegetables to soups, stews, pasta, or rice. Add nuts or seeds for added healthy fat at each meal. You can add these to yogurt, salads, or vegetable  dishes. Marinate fish or vegetables using olive oil, lemon juice, garlic, and fresh herbs. Meal planning  Plan to eat 1 vegetarian meal one day each week. Try to work up to 2 vegetarian meals, if possible. Eat seafood 2 or more times a week. Have healthy snacks readily available, such as: Vegetable sticks with hummus. Greek yogurt. Fruit and nut trail mix. Eat balanced meals throughout the week. This includes: Fruit: 2-3 servings a day Vegetables: 4-5 servings a day Low-fat dairy: 2 servings a day Fish, poultry, or lean meat: 1 serving a day Beans and legumes: 2 or more servings a week Nuts and seeds: 1-2 servings a day Whole grains: 6-8 servings a day Extra-virgin olive oil: 3-4 servings a day Limit red meat and sweets to only a few servings a month What are my food choices? Mediterranean diet Recommended Grains: Whole-grain pasta. Brown rice. Bulgar wheat. Polenta. Couscous. Whole-wheat bread. [place] Made[place]. Vegetables: Artichokes. Beets. Broccoli. Cabbage. Carrots. Eggplant. Green beans. Chard. Kale. Spinach. Onions. Leeks. Peas. Squash. Tomatoes. Peppers. Radishes. Fruits: Apples. Apricots. Avocado. Berries. Bananas. Cherries. Dates. Figs. Grapes. Lemons. Melon. Oranges. Peaches. Plums. Pomegranate. Meats and other protein foods: Beans. Almonds. Sunflower seeds. Pine nuts. Peanuts. Cod. Salmon. Scallops. Shrimp. Tuna. Tilapia. Clams. Oysters. Eggs. Dairy: Low-fat milk. Cheese. Greek yogurt. Beverages: Water. Red wine. Herbal tea. Fats and oils: Extra virgin olive oil. Avocado oil. Grape seed oil. Sweets and desserts: Greek yogurt with honey. Baked apples. Poached pears. Trail mix. Seasoning and other foods: Basil. Cilantro. Coriander. Cumin. Mint. Parsley. Sage. Rosemary. Tarragon. Garlic. Oregano. Thyme. Pepper. Balsalmic vinegar. Tahini. Hummus. Tomato sauce. Olives. Mushrooms. Limit these Grains: Prepackaged pasta or rice dishes. Prepackaged cereal with added  sugar. Vegetables: Deep fried potatoes (french fries). Fruits: Fruit canned in syrup. Meats and other protein foods: Beef. Pork. Lamb. Poultry with skin. Hot dogs. [place]. Dairy: Ice cream. Sour cream. Whole milk. Beverages: Juice. Sugar-sweetened soft drinks. Beer. Liquor and spirits. Fats and oils: Butter. Canola oil. Vegetable oil. Beef fat (tallow). Lard. Sweets and desserts: Cookies. Cakes. Pies. Candy. Seasoning and other foods: Mayonnaise. Premade sauces and marinades. The items listed may not be a complete list. Talk with your dietitian about what dietary choices are right for you. Summary The Mediterranean diet includes both food and lifestyle choices. Eat a variety of fresh fruits and vegetables, beans, nuts, seeds, and whole grains. Limit the amount of red meat and sweets that you eat. Talk with your health care provider about whether it is safe for you to drink red wine in moderation. This means 1 glass a day for nonpregnant women and 2 glasses a day for men. A glass of wine equals 5 oz (150 mL). This information is not intended to replace advice given to you by your health care provider. Make sure you discuss any questions you have with your health care provider. Document Released: 09/15/2015 Document Revised: 10/18/2015 Document Reviewed: 09/15/2015 Elsevier Interactive Patient Education  2017 [employer].

## 2024-03-30 ENCOUNTER — Ambulatory Visit: Admitting: Nurse Practitioner

## 2024-03-30 ENCOUNTER — Other Ambulatory Visit

## 2024-09-15 ENCOUNTER — Ambulatory Visit: Payer: Self-pay | Admitting: Physician Assistant
